# Patient Record
Sex: Female | Born: 1950 | Race: White | Hispanic: No | Marital: Married | Smoking: Never smoker
Health system: Southern US, Community
[De-identification: ages and names within clinical notes are randomized; demographics above are authoritative.]

## PROBLEM LIST (undated history)

## (undated) DIAGNOSIS — E282 Polycystic ovarian syndrome: Secondary | ICD-10-CM

## (undated) DIAGNOSIS — M549 Dorsalgia, unspecified: Secondary | ICD-10-CM

## (undated) DIAGNOSIS — C50912 Malignant neoplasm of unspecified site of left female breast: Principal | ICD-10-CM

## (undated) DIAGNOSIS — Z923 Personal history of irradiation: Secondary | ICD-10-CM

## (undated) DIAGNOSIS — T7840XA Allergy, unspecified, initial encounter: Secondary | ICD-10-CM

## (undated) DIAGNOSIS — H269 Unspecified cataract: Secondary | ICD-10-CM

## (undated) DIAGNOSIS — Z9221 Personal history of antineoplastic chemotherapy: Secondary | ICD-10-CM

## (undated) DIAGNOSIS — K5900 Constipation, unspecified: Secondary | ICD-10-CM

## (undated) DIAGNOSIS — H3530 Unspecified macular degeneration: Secondary | ICD-10-CM

## (undated) DIAGNOSIS — R001 Bradycardia, unspecified: Secondary | ICD-10-CM

## (undated) DIAGNOSIS — M1990 Unspecified osteoarthritis, unspecified site: Secondary | ICD-10-CM

## (undated) DIAGNOSIS — I10 Essential (primary) hypertension: Secondary | ICD-10-CM

## (undated) DIAGNOSIS — F419 Anxiety disorder, unspecified: Secondary | ICD-10-CM

## (undated) DIAGNOSIS — C50919 Malignant neoplasm of unspecified site of unspecified female breast: Secondary | ICD-10-CM

## (undated) DIAGNOSIS — K589 Irritable bowel syndrome without diarrhea: Secondary | ICD-10-CM

## (undated) DIAGNOSIS — R079 Chest pain, unspecified: Secondary | ICD-10-CM

## (undated) DIAGNOSIS — E669 Obesity, unspecified: Secondary | ICD-10-CM

## (undated) DIAGNOSIS — R002 Palpitations: Secondary | ICD-10-CM

## (undated) DIAGNOSIS — C801 Malignant (primary) neoplasm, unspecified: Secondary | ICD-10-CM

## (undated) DIAGNOSIS — R0602 Shortness of breath: Secondary | ICD-10-CM

## (undated) DIAGNOSIS — F319 Bipolar disorder, unspecified: Secondary | ICD-10-CM

## (undated) DIAGNOSIS — F329 Major depressive disorder, single episode, unspecified: Secondary | ICD-10-CM

## (undated) DIAGNOSIS — R7301 Impaired fasting glucose: Secondary | ICD-10-CM

## (undated) DIAGNOSIS — I509 Heart failure, unspecified: Secondary | ICD-10-CM

## (undated) DIAGNOSIS — G4730 Sleep apnea, unspecified: Secondary | ICD-10-CM

## (undated) DIAGNOSIS — Z950 Presence of cardiac pacemaker: Secondary | ICD-10-CM

## (undated) DIAGNOSIS — R413 Other amnesia: Secondary | ICD-10-CM

## (undated) DIAGNOSIS — F32A Depression, unspecified: Secondary | ICD-10-CM

## (undated) DIAGNOSIS — E785 Hyperlipidemia, unspecified: Secondary | ICD-10-CM

## (undated) DIAGNOSIS — E7800 Pure hypercholesterolemia, unspecified: Secondary | ICD-10-CM

## (undated) HISTORY — DX: Constipation, unspecified: K59.00

## (undated) HISTORY — DX: Essential (primary) hypertension: I10

## (undated) HISTORY — PX: TOTAL KNEE ARTHROPLASTY: SHX125

## (undated) HISTORY — DX: Obesity, unspecified: E66.9

## (undated) HISTORY — DX: Sleep apnea, unspecified: G47.30

## (undated) HISTORY — DX: Chest pain, unspecified: R07.9

## (undated) HISTORY — DX: Impaired fasting glucose: R73.01

## (undated) HISTORY — PX: APPENDECTOMY: SHX54

## (undated) HISTORY — DX: Depression, unspecified: F32.A

## (undated) HISTORY — DX: Shortness of breath: R06.02

## (undated) HISTORY — PX: CHOLECYSTECTOMY: SHX55

## (undated) HISTORY — DX: Unspecified osteoarthritis, unspecified site: M19.90

## (undated) HISTORY — PX: ABDOMINAL HYSTERECTOMY: SHX81

## (undated) HISTORY — PX: EYE SURGERY: SHX253

## (undated) HISTORY — DX: Malignant neoplasm of unspecified site of left female breast: C50.912

## (undated) HISTORY — DX: Unspecified macular degeneration: H35.30

## (undated) HISTORY — DX: Other amnesia: R41.3

## (undated) HISTORY — PX: JOINT REPLACEMENT: SHX530

## (undated) HISTORY — DX: Hyperlipidemia, unspecified: E78.5

## (undated) HISTORY — DX: Allergy, unspecified, initial encounter: T78.40XA

## (undated) HISTORY — DX: Dorsalgia, unspecified: M54.9

## (undated) HISTORY — DX: Irritable bowel syndrome, unspecified: K58.9

## (undated) HISTORY — PX: CATARACT EXTRACTION: SUR2

## (undated) HISTORY — DX: Palpitations: R00.2

## (undated) HISTORY — DX: Major depressive disorder, single episode, unspecified: F32.9

## (undated) HISTORY — DX: Unspecified cataract: H26.9

## (undated) HISTORY — DX: Polycystic ovarian syndrome: E28.2

---

## 1998-03-19 ENCOUNTER — Encounter (HOSPITAL_COMMUNITY): Admission: RE | Admit: 1998-03-19 | Discharge: 1998-06-17 | Payer: Self-pay

## 1998-03-19 ENCOUNTER — Emergency Department (HOSPITAL_COMMUNITY): Admission: EM | Admit: 1998-03-19 | Discharge: 1998-03-19 | Payer: Self-pay | Admitting: Emergency Medicine

## 1998-10-20 ENCOUNTER — Inpatient Hospital Stay (HOSPITAL_COMMUNITY): Admission: AD | Admit: 1998-10-20 | Discharge: 1998-10-27 | Payer: Self-pay | Admitting: General Practice

## 2000-10-04 ENCOUNTER — Inpatient Hospital Stay (HOSPITAL_COMMUNITY): Admission: EM | Admit: 2000-10-04 | Discharge: 2000-10-07 | Payer: Self-pay | Admitting: *Deleted

## 2000-10-10 ENCOUNTER — Other Ambulatory Visit (HOSPITAL_COMMUNITY): Admission: RE | Admit: 2000-10-10 | Discharge: 2000-10-14 | Payer: Self-pay | Admitting: Psychiatry

## 2000-10-18 ENCOUNTER — Emergency Department (HOSPITAL_COMMUNITY): Admission: EM | Admit: 2000-10-18 | Discharge: 2000-10-18 | Payer: Self-pay | Admitting: *Deleted

## 2000-10-18 ENCOUNTER — Encounter: Payer: Self-pay | Admitting: *Deleted

## 2000-11-14 ENCOUNTER — Other Ambulatory Visit: Admission: RE | Admit: 2000-11-14 | Discharge: 2000-11-14 | Payer: Self-pay | Admitting: *Deleted

## 2001-01-20 ENCOUNTER — Encounter: Payer: Self-pay | Admitting: Orthopaedic Surgery

## 2001-01-20 ENCOUNTER — Encounter: Admission: RE | Admit: 2001-01-20 | Discharge: 2001-01-20 | Payer: Self-pay | Admitting: Orthopaedic Surgery

## 2001-03-17 ENCOUNTER — Ambulatory Visit (HOSPITAL_BASED_OUTPATIENT_CLINIC_OR_DEPARTMENT_OTHER): Admission: RE | Admit: 2001-03-17 | Discharge: 2001-03-17 | Payer: Self-pay | Admitting: Family Medicine

## 2001-03-23 ENCOUNTER — Encounter (HOSPITAL_COMMUNITY): Admission: RE | Admit: 2001-03-23 | Discharge: 2001-04-22 | Payer: Self-pay | Admitting: Rheumatology

## 2001-05-18 ENCOUNTER — Encounter (HOSPITAL_COMMUNITY): Admission: RE | Admit: 2001-05-18 | Discharge: 2001-06-17 | Payer: Self-pay | Admitting: Rheumatology

## 2001-09-06 ENCOUNTER — Other Ambulatory Visit (HOSPITAL_COMMUNITY): Admission: RE | Admit: 2001-09-06 | Discharge: 2001-09-18 | Payer: Self-pay | Admitting: Psychiatry

## 2001-10-14 ENCOUNTER — Encounter: Payer: Self-pay | Admitting: Pediatrics

## 2001-10-14 ENCOUNTER — Ambulatory Visit (HOSPITAL_COMMUNITY): Admission: RE | Admit: 2001-10-14 | Discharge: 2001-10-14 | Payer: Self-pay | Admitting: Pediatrics

## 2001-11-28 ENCOUNTER — Ambulatory Visit (HOSPITAL_COMMUNITY): Admission: RE | Admit: 2001-11-28 | Discharge: 2001-11-28 | Payer: Self-pay | Admitting: Family Medicine

## 2002-03-20 ENCOUNTER — Ambulatory Visit (HOSPITAL_COMMUNITY): Admission: RE | Admit: 2002-03-20 | Discharge: 2002-03-20 | Payer: Self-pay | Admitting: Internal Medicine

## 2002-05-10 ENCOUNTER — Other Ambulatory Visit (HOSPITAL_COMMUNITY): Admission: RE | Admit: 2002-05-10 | Discharge: 2002-05-18 | Payer: Self-pay | Admitting: Psychiatry

## 2002-11-14 ENCOUNTER — Other Ambulatory Visit (HOSPITAL_COMMUNITY): Admission: RE | Admit: 2002-11-14 | Discharge: 2002-11-23 | Payer: Self-pay | Admitting: Psychiatry

## 2003-02-12 ENCOUNTER — Inpatient Hospital Stay (HOSPITAL_COMMUNITY): Admission: EM | Admit: 2003-02-12 | Discharge: 2003-02-18 | Payer: Self-pay | Admitting: Psychiatry

## 2003-02-19 ENCOUNTER — Other Ambulatory Visit (HOSPITAL_COMMUNITY): Admission: RE | Admit: 2003-02-19 | Discharge: 2003-03-01 | Payer: Self-pay | Admitting: Psychiatry

## 2003-09-17 ENCOUNTER — Emergency Department (HOSPITAL_COMMUNITY): Admission: EM | Admit: 2003-09-17 | Discharge: 2003-09-17 | Payer: Self-pay | Admitting: Emergency Medicine

## 2003-09-30 ENCOUNTER — Ambulatory Visit (HOSPITAL_COMMUNITY): Admission: RE | Admit: 2003-09-30 | Discharge: 2003-09-30 | Payer: Self-pay | Admitting: Family Medicine

## 2003-10-31 ENCOUNTER — Ambulatory Visit (HOSPITAL_COMMUNITY): Admission: RE | Admit: 2003-10-31 | Discharge: 2003-10-31 | Payer: Self-pay | Admitting: *Deleted

## 2003-12-13 ENCOUNTER — Emergency Department (HOSPITAL_COMMUNITY): Admission: EM | Admit: 2003-12-13 | Discharge: 2003-12-13 | Payer: Self-pay | Admitting: Emergency Medicine

## 2006-07-12 HISTORY — PX: BREAST LUMPECTOMY: SHX2

## 2006-07-12 HISTORY — PX: BREAST BIOPSY: SHX20

## 2006-08-30 ENCOUNTER — Inpatient Hospital Stay (HOSPITAL_COMMUNITY): Admission: RE | Admit: 2006-08-30 | Discharge: 2006-09-02 | Payer: Self-pay | Admitting: Specialist

## 2007-04-26 ENCOUNTER — Encounter: Admission: RE | Admit: 2007-04-26 | Discharge: 2007-04-26 | Payer: Self-pay | Admitting: Family Medicine

## 2007-05-01 ENCOUNTER — Encounter (INDEPENDENT_AMBULATORY_CARE_PROVIDER_SITE_OTHER): Payer: Self-pay | Admitting: Diagnostic Radiology

## 2007-05-01 ENCOUNTER — Encounter: Admission: RE | Admit: 2007-05-01 | Discharge: 2007-05-01 | Payer: Self-pay | Admitting: Family Medicine

## 2007-05-09 ENCOUNTER — Encounter: Admission: RE | Admit: 2007-05-09 | Discharge: 2007-05-09 | Payer: Self-pay | Admitting: Family Medicine

## 2007-05-22 ENCOUNTER — Encounter: Admission: RE | Admit: 2007-05-22 | Discharge: 2007-05-22 | Payer: Self-pay | Admitting: Surgery

## 2007-05-23 ENCOUNTER — Ambulatory Visit (HOSPITAL_BASED_OUTPATIENT_CLINIC_OR_DEPARTMENT_OTHER): Admission: RE | Admit: 2007-05-23 | Discharge: 2007-05-23 | Payer: Self-pay | Admitting: Surgery

## 2007-05-23 ENCOUNTER — Encounter (INDEPENDENT_AMBULATORY_CARE_PROVIDER_SITE_OTHER): Payer: Self-pay | Admitting: Surgery

## 2007-05-26 ENCOUNTER — Ambulatory Visit: Payer: Self-pay | Admitting: Oncology

## 2007-06-07 LAB — COMPREHENSIVE METABOLIC PANEL
ALT: 17 U/L (ref 0–35)
AST: 18 U/L (ref 0–37)
Alkaline Phosphatase: 110 U/L (ref 39–117)
BUN: 21 mg/dL (ref 6–23)
Chloride: 103 mEq/L (ref 96–112)
Creatinine, Ser: 0.71 mg/dL (ref 0.40–1.20)
Total Bilirubin: 0.5 mg/dL (ref 0.3–1.2)

## 2007-06-07 LAB — CBC WITH DIFFERENTIAL/PLATELET
BASO%: 0.3 % (ref 0.0–2.0)
Basophils Absolute: 0 10*3/uL (ref 0.0–0.1)
EOS%: 0 % (ref 0.0–7.0)
HCT: 40 % (ref 34.8–46.6)
HGB: 13.9 g/dL (ref 11.6–15.9)
MCH: 31.1 pg (ref 26.0–34.0)
MCHC: 34.8 g/dL (ref 32.0–36.0)
MCV: 89.5 fL (ref 81.0–101.0)
MONO%: 9.3 % (ref 0.0–13.0)
NEUT%: 59.8 % (ref 39.6–76.8)
RDW: 13.1 % (ref 11.3–14.5)
lymph#: 1.7 10*3/uL (ref 0.9–3.3)

## 2007-06-07 LAB — CANCER ANTIGEN 27.29: CA 27.29: 16 U/mL (ref 0–39)

## 2007-06-14 LAB — VITAMIN D PNL(25-HYDRXY+1,25-DIHY)-BLD
Vit D, 1,25-Dihydroxy: 28 pg/mL (ref 6–62)
Vit D, 25-Hydroxy: 27 ng/mL — ABNORMAL LOW (ref 30–89)

## 2007-06-16 ENCOUNTER — Ambulatory Visit (HOSPITAL_COMMUNITY): Admission: RE | Admit: 2007-06-16 | Discharge: 2007-06-16 | Payer: Self-pay | Admitting: Oncology

## 2007-06-20 ENCOUNTER — Ambulatory Visit: Admission: RE | Admit: 2007-06-20 | Discharge: 2007-07-04 | Payer: Self-pay | Admitting: Radiation Oncology

## 2007-07-10 ENCOUNTER — Encounter: Admission: RE | Admit: 2007-07-10 | Discharge: 2007-07-10 | Payer: Self-pay | Admitting: Oncology

## 2007-07-11 ENCOUNTER — Ambulatory Visit: Payer: Self-pay | Admitting: Oncology

## 2007-07-13 DIAGNOSIS — Z923 Personal history of irradiation: Secondary | ICD-10-CM

## 2007-07-13 DIAGNOSIS — Z9221 Personal history of antineoplastic chemotherapy: Secondary | ICD-10-CM

## 2007-07-13 HISTORY — DX: Personal history of antineoplastic chemotherapy: Z92.21

## 2007-07-13 HISTORY — DX: Personal history of irradiation: Z92.3

## 2007-07-18 ENCOUNTER — Emergency Department (HOSPITAL_COMMUNITY): Admission: EM | Admit: 2007-07-18 | Discharge: 2007-07-18 | Payer: Self-pay | Admitting: Emergency Medicine

## 2007-07-21 LAB — COMPREHENSIVE METABOLIC PANEL
ALT: 37 U/L — ABNORMAL HIGH (ref 0–35)
Albumin: 3.6 g/dL (ref 3.5–5.2)
CO2: 29 mEq/L (ref 19–32)
Glucose, Bld: 98 mg/dL (ref 70–99)
Potassium: 3 mEq/L — ABNORMAL LOW (ref 3.5–5.3)
Sodium: 136 mEq/L (ref 135–145)
Total Bilirubin: 0.9 mg/dL (ref 0.3–1.2)
Total Protein: 6.9 g/dL (ref 6.0–8.3)

## 2007-07-21 LAB — CBC WITH DIFFERENTIAL/PLATELET
BASO%: 0.1 % (ref 0.0–2.0)
Eosinophils Absolute: 0 10*3/uL (ref 0.0–0.5)
LYMPH%: 22.7 % (ref 14.0–48.0)
MCHC: 34.4 g/dL (ref 32.0–36.0)
MONO#: 0.3 10*3/uL (ref 0.1–0.9)
NEUT#: 5.6 10*3/uL (ref 1.5–6.5)
RBC: 4.26 10*6/uL (ref 3.70–5.32)
RDW: 12.6 % (ref 11.3–14.5)
WBC: 7.7 10*3/uL (ref 3.9–10.0)
lymph#: 1.8 10*3/uL (ref 0.9–3.3)

## 2007-08-04 LAB — CBC WITH DIFFERENTIAL/PLATELET
BASO%: 0.3 % (ref 0.0–2.0)
Basophils Absolute: 0 10*3/uL (ref 0.0–0.1)
Eosinophils Absolute: 0 10*3/uL (ref 0.0–0.5)
HCT: 35.2 % (ref 34.8–46.6)
HGB: 12.4 g/dL (ref 11.6–15.9)
LYMPH%: 7.4 % — ABNORMAL LOW (ref 14.0–48.0)
MCHC: 35.1 g/dL (ref 32.0–36.0)
MCV: 88 fL (ref 81.0–101.0)
MONO%: 1.7 % (ref 0.0–13.0)
NEUT#: 7.4 10*3/uL — ABNORMAL HIGH (ref 1.5–6.5)
NEUT%: 90.7 % — ABNORMAL HIGH (ref 39.6–76.8)
Platelets: 511 10*3/uL — ABNORMAL HIGH (ref 145–400)
RBC: 4 10*6/uL (ref 3.70–5.32)
RDW: 11.6 % (ref 11.3–14.5)
WBC: 8.1 10*3/uL (ref 3.9–10.0)
lymph#: 0.6 10*3/uL — ABNORMAL LOW (ref 0.9–3.3)

## 2007-08-04 LAB — COMPREHENSIVE METABOLIC PANEL
ALT: 18 U/L (ref 0–35)
Albumin: 4.7 g/dL (ref 3.5–5.2)
CO2: 22 mEq/L (ref 19–32)
Calcium: 9.6 mg/dL (ref 8.4–10.5)
Chloride: 102 mEq/L (ref 96–112)
Glucose, Bld: 101 mg/dL — ABNORMAL HIGH (ref 70–99)
Potassium: 4.4 mEq/L (ref 3.5–5.3)
Sodium: 137 mEq/L (ref 135–145)
Total Bilirubin: 0.4 mg/dL (ref 0.3–1.2)
Total Protein: 7.5 g/dL (ref 6.0–8.3)

## 2007-08-11 LAB — CBC WITH DIFFERENTIAL/PLATELET
BASO%: 0.2 % (ref 0.0–2.0)
Eosinophils Absolute: 0 10*3/uL (ref 0.0–0.5)
MCHC: 34.3 g/dL (ref 32.0–36.0)
MONO#: 1 10*3/uL — ABNORMAL HIGH (ref 0.1–0.9)
NEUT#: 14 10*3/uL — ABNORMAL HIGH (ref 1.5–6.5)
RBC: 3.99 10*6/uL (ref 3.70–5.32)
RDW: 13.9 % (ref 11.3–14.5)
WBC: 17 10*3/uL — ABNORMAL HIGH (ref 3.9–10.0)
lymph#: 2 10*3/uL (ref 0.9–3.3)

## 2007-08-23 ENCOUNTER — Ambulatory Visit: Payer: Self-pay | Admitting: Oncology

## 2007-08-25 LAB — COMPREHENSIVE METABOLIC PANEL
ALT: 16 U/L (ref 0–35)
AST: 18 U/L (ref 0–37)
Alkaline Phosphatase: 117 U/L (ref 39–117)
BUN: 15 mg/dL (ref 6–23)
Creatinine, Ser: 0.57 mg/dL (ref 0.40–1.20)
Total Bilirubin: 0.3 mg/dL (ref 0.3–1.2)

## 2007-08-25 LAB — CBC WITH DIFFERENTIAL/PLATELET
BASO%: 0.3 % (ref 0.0–2.0)
EOS%: 0.2 % (ref 0.0–7.0)
HCT: 33.2 % — ABNORMAL LOW (ref 34.8–46.6)
LYMPH%: 6 % — ABNORMAL LOW (ref 14.0–48.0)
MCH: 30.7 pg (ref 26.0–34.0)
MCHC: 34.4 g/dL (ref 32.0–36.0)
MCV: 89.1 fL (ref 81.0–101.0)
MONO%: 1 % (ref 0.0–13.0)
NEUT%: 92.4 % — ABNORMAL HIGH (ref 39.6–76.8)
Platelets: 454 10*3/uL — ABNORMAL HIGH (ref 145–400)
lymph#: 0.5 10*3/uL — ABNORMAL LOW (ref 0.9–3.3)

## 2007-09-01 LAB — CBC WITH DIFFERENTIAL/PLATELET
BASO%: 0 % (ref 0.0–2.0)
Basophils Absolute: 0 10*3/uL (ref 0.0–0.1)
EOS%: 0 % (ref 0.0–7.0)
HCT: 33.9 % — ABNORMAL LOW (ref 34.8–46.6)
HGB: 11.8 g/dL (ref 11.6–15.9)
LYMPH%: 22.5 % (ref 14.0–48.0)
MCH: 31.6 pg (ref 26.0–34.0)
MCHC: 34.8 g/dL (ref 32.0–36.0)
NEUT%: 66.1 % (ref 39.6–76.8)
Platelets: 310 10*3/uL (ref 145–400)

## 2007-09-15 LAB — COMPREHENSIVE METABOLIC PANEL
ALT: 13 U/L (ref 0–35)
AST: 15 U/L (ref 0–37)
Albumin: 4.6 g/dL (ref 3.5–5.2)
Calcium: 9.5 mg/dL (ref 8.4–10.5)
Chloride: 105 mEq/L (ref 96–112)
Potassium: 4.2 mEq/L (ref 3.5–5.3)

## 2007-09-15 LAB — CBC WITH DIFFERENTIAL/PLATELET
BASO%: 0.1 % (ref 0.0–2.0)
EOS%: 0 % (ref 0.0–7.0)
HGB: 11.3 g/dL — ABNORMAL LOW (ref 11.6–15.9)
MCH: 31.1 pg (ref 26.0–34.0)
MCHC: 33.4 g/dL (ref 32.0–36.0)
RDW: 15.3 % — ABNORMAL HIGH (ref 11.3–14.5)
lymph#: 0.7 10*3/uL — ABNORMAL LOW (ref 0.9–3.3)

## 2007-09-22 ENCOUNTER — Ambulatory Visit: Admission: RE | Admit: 2007-09-22 | Discharge: 2007-12-05 | Payer: Self-pay | Admitting: Radiation Oncology

## 2007-09-22 LAB — COMPREHENSIVE METABOLIC PANEL
ALT: 14 U/L (ref 0–35)
AST: 16 U/L (ref 0–37)
Alkaline Phosphatase: 107 U/L (ref 39–117)
Calcium: 9.2 mg/dL (ref 8.4–10.5)
Chloride: 104 mEq/L (ref 96–112)
Creatinine, Ser: 0.6 mg/dL (ref 0.40–1.20)

## 2007-09-22 LAB — CBC WITH DIFFERENTIAL/PLATELET
BASO%: 0.2 % (ref 0.0–2.0)
EOS%: 0 % (ref 0.0–7.0)
MCH: 31.4 pg (ref 26.0–34.0)
MCHC: 34.4 g/dL (ref 32.0–36.0)
NEUT%: 50.8 % (ref 39.6–76.8)
RDW: 16.1 % — ABNORMAL HIGH (ref 11.3–14.5)
lymph#: 1.2 10*3/uL (ref 0.9–3.3)

## 2007-11-23 ENCOUNTER — Ambulatory Visit: Payer: Self-pay | Admitting: Psychiatry

## 2007-11-23 ENCOUNTER — Ambulatory Visit: Payer: Self-pay | Admitting: Oncology

## 2007-11-29 ENCOUNTER — Ambulatory Visit: Payer: Self-pay | Admitting: Psychiatry

## 2007-12-06 ENCOUNTER — Ambulatory Visit: Payer: Self-pay | Admitting: Psychiatry

## 2007-12-26 ENCOUNTER — Ambulatory Visit: Payer: Self-pay | Admitting: Psychiatry

## 2007-12-27 LAB — CBC WITH DIFFERENTIAL/PLATELET
Basophils Absolute: 0 10*3/uL (ref 0.0–0.1)
Eosinophils Absolute: 0 10*3/uL (ref 0.0–0.5)
HCT: 39 % (ref 34.8–46.6)
HGB: 13.4 g/dL (ref 11.6–15.9)
MCV: 87.5 fL (ref 81.0–101.0)
MONO%: 12.3 % (ref 0.0–13.0)
NEUT#: 2.5 10*3/uL (ref 1.5–6.5)
NEUT%: 56.3 % (ref 39.6–76.8)
Platelets: 274 10*3/uL (ref 145–400)
RDW: 13.8 % (ref 11.3–14.5)

## 2007-12-28 LAB — COMPREHENSIVE METABOLIC PANEL
Albumin: 4.7 g/dL (ref 3.5–5.2)
Alkaline Phosphatase: 115 U/L (ref 39–117)
BUN: 24 mg/dL — ABNORMAL HIGH (ref 6–23)
Calcium: 10.2 mg/dL (ref 8.4–10.5)
Glucose, Bld: 95 mg/dL (ref 70–99)
Potassium: 4.8 mEq/L (ref 3.5–5.3)

## 2007-12-28 LAB — CANCER ANTIGEN 27.29: CA 27.29: 19 U/mL (ref 0–39)

## 2007-12-28 LAB — VITAMIN D 25 HYDROXY (VIT D DEFICIENCY, FRACTURES): Vit D, 25-Hydroxy: 30 ng/mL (ref 30–89)

## 2008-01-23 ENCOUNTER — Ambulatory Visit: Payer: Self-pay | Admitting: Psychiatry

## 2008-02-20 ENCOUNTER — Ambulatory Visit: Payer: Self-pay | Admitting: Psychiatry

## 2008-03-05 ENCOUNTER — Ambulatory Visit: Payer: Self-pay | Admitting: Oncology

## 2008-03-05 LAB — COMPREHENSIVE METABOLIC PANEL
ALT: 28 U/L (ref 0–35)
AST: 26 U/L (ref 0–37)
Alkaline Phosphatase: 140 U/L — ABNORMAL HIGH (ref 39–117)
Creatinine, Ser: 0.73 mg/dL (ref 0.40–1.20)
Sodium: 141 mEq/L (ref 135–145)
Total Bilirubin: 0.6 mg/dL (ref 0.3–1.2)

## 2008-03-05 LAB — CBC WITH DIFFERENTIAL/PLATELET
BASO%: 0.2 % (ref 0.0–2.0)
EOS%: 0 % (ref 0.0–7.0)
HCT: 38.3 % (ref 34.8–46.6)
LYMPH%: 26.8 % (ref 14.0–48.0)
MCH: 30.4 pg (ref 26.0–34.0)
MCHC: 34.1 g/dL (ref 32.0–36.0)
NEUT%: 62.3 % (ref 39.6–76.8)
Platelets: 259 10*3/uL (ref 145–400)
RBC: 4.29 10*6/uL (ref 3.70–5.32)

## 2008-03-05 LAB — LACTATE DEHYDROGENASE: LDH: 187 U/L (ref 94–250)

## 2008-03-12 ENCOUNTER — Other Ambulatory Visit (HOSPITAL_COMMUNITY): Admission: RE | Admit: 2008-03-12 | Discharge: 2008-03-29 | Payer: Self-pay | Admitting: Psychiatry

## 2008-03-14 ENCOUNTER — Ambulatory Visit: Payer: Self-pay | Admitting: Psychiatry

## 2008-05-01 ENCOUNTER — Encounter: Admission: RE | Admit: 2008-05-01 | Discharge: 2008-05-01 | Payer: Self-pay | Admitting: Oncology

## 2008-05-06 ENCOUNTER — Ambulatory Visit: Payer: Self-pay | Admitting: Oncology

## 2008-07-30 ENCOUNTER — Ambulatory Visit: Payer: Self-pay | Admitting: Oncology

## 2008-11-27 ENCOUNTER — Ambulatory Visit (HOSPITAL_COMMUNITY): Admission: RE | Admit: 2008-11-27 | Discharge: 2008-11-27 | Payer: Self-pay | Admitting: Specialist

## 2009-01-27 ENCOUNTER — Ambulatory Visit: Payer: Self-pay | Admitting: Oncology

## 2009-02-03 LAB — COMPREHENSIVE METABOLIC PANEL
ALT: 29 U/L (ref 0–35)
AST: 30 U/L (ref 0–37)
Albumin: 4.4 g/dL (ref 3.5–5.2)
BUN: 16 mg/dL (ref 6–23)
CO2: 28 mEq/L (ref 19–32)
Calcium: 9.8 mg/dL (ref 8.4–10.5)
Chloride: 102 mEq/L (ref 96–112)
Creatinine, Ser: 0.62 mg/dL (ref 0.40–1.20)
Potassium: 4.2 mEq/L (ref 3.5–5.3)

## 2009-02-03 LAB — CBC WITH DIFFERENTIAL/PLATELET
Basophils Absolute: 0 10*3/uL (ref 0.0–0.1)
EOS%: 0.1 % (ref 0.0–7.0)
HCT: 41.3 % (ref 34.8–46.6)
HGB: 14 g/dL (ref 11.6–15.9)
MCH: 31.3 pg (ref 25.1–34.0)
MONO#: 0.6 10*3/uL (ref 0.1–0.9)
NEUT#: 3 10*3/uL (ref 1.5–6.5)
NEUT%: 54.4 % (ref 38.4–76.8)
RDW: 13.7 % (ref 11.2–14.5)
WBC: 5.6 10*3/uL (ref 3.9–10.3)
lymph#: 1.9 10*3/uL (ref 0.9–3.3)

## 2009-02-03 LAB — LACTATE DEHYDROGENASE: LDH: 168 U/L (ref 94–250)

## 2009-02-04 LAB — VITAMIN D 25 HYDROXY (VIT D DEFICIENCY, FRACTURES): Vit D, 25-Hydroxy: 28 ng/mL — ABNORMAL LOW (ref 30–89)

## 2009-05-02 ENCOUNTER — Encounter: Admission: RE | Admit: 2009-05-02 | Discharge: 2009-05-02 | Payer: Self-pay | Admitting: Oncology

## 2009-07-12 HISTORY — PX: BREAST BIOPSY: SHX20

## 2009-07-24 ENCOUNTER — Ambulatory Visit: Payer: Self-pay | Admitting: Oncology

## 2009-07-28 LAB — COMPREHENSIVE METABOLIC PANEL
ALT: 30 U/L (ref 0–35)
AST: 32 U/L (ref 0–37)
Albumin: 4.2 g/dL (ref 3.5–5.2)
BUN: 18 mg/dL (ref 6–23)
Calcium: 9.5 mg/dL (ref 8.4–10.5)
Chloride: 100 mEq/L (ref 96–112)
Glucose, Bld: 92 mg/dL (ref 70–99)
Potassium: 4 mEq/L (ref 3.5–5.3)
Sodium: 136 mEq/L (ref 135–145)
Total Bilirubin: 0.7 mg/dL (ref 0.3–1.2)
Total Protein: 7.7 g/dL (ref 6.0–8.3)

## 2009-07-28 LAB — CBC WITH DIFFERENTIAL/PLATELET
HCT: 40.7 % (ref 34.8–46.6)
LYMPH%: 33.3 % (ref 14.0–49.7)
MONO#: 0.5 10*3/uL (ref 0.1–0.9)
MONO%: 9.2 % (ref 0.0–14.0)
NEUT#: 3 10*3/uL (ref 1.5–6.5)
NEUT%: 56.7 % (ref 38.4–76.8)
RBC: 4.36 10*6/uL (ref 3.70–5.45)
WBC: 5.4 10*3/uL (ref 3.9–10.3)

## 2009-07-28 LAB — VITAMIN D 25 HYDROXY (VIT D DEFICIENCY, FRACTURES): Vit D, 25-Hydroxy: 34 ng/mL (ref 30–89)

## 2009-10-08 ENCOUNTER — Encounter: Admission: RE | Admit: 2009-10-08 | Discharge: 2009-10-08 | Payer: Self-pay | Admitting: Oncology

## 2009-10-15 ENCOUNTER — Encounter: Admission: RE | Admit: 2009-10-15 | Discharge: 2009-10-15 | Payer: Self-pay | Admitting: Oncology

## 2009-12-01 ENCOUNTER — Ambulatory Visit: Payer: Self-pay | Admitting: Oncology

## 2009-12-02 LAB — CBC WITH DIFFERENTIAL/PLATELET
Basophils Absolute: 0 10*3/uL (ref 0.0–0.1)
EOS%: 1.1 % (ref 0.0–7.0)
MCH: 30.7 pg (ref 25.1–34.0)
MCHC: 32.8 g/dL (ref 31.5–36.0)
NEUT#: 3 10*3/uL (ref 1.5–6.5)
NEUT%: 53.3 % (ref 38.4–76.8)
Platelets: 293 10*3/uL (ref 145–400)
RDW: 13.6 % (ref 11.2–14.5)

## 2009-12-02 LAB — COMPREHENSIVE METABOLIC PANEL
AST: 18 U/L (ref 0–37)
Albumin: 4.3 g/dL (ref 3.5–5.2)
Alkaline Phosphatase: 104 U/L (ref 39–117)
BUN: 19 mg/dL (ref 6–23)
Calcium: 9.7 mg/dL (ref 8.4–10.5)
Sodium: 140 mEq/L (ref 135–145)
Total Bilirubin: 0.4 mg/dL (ref 0.3–1.2)
Total Protein: 7.2 g/dL (ref 6.0–8.3)

## 2010-05-07 ENCOUNTER — Encounter: Admission: RE | Admit: 2010-05-07 | Discharge: 2010-05-07 | Payer: Self-pay | Admitting: Oncology

## 2010-05-11 LAB — HM PAP SMEAR: HM Pap smear: NORMAL

## 2010-06-05 ENCOUNTER — Ambulatory Visit: Payer: Self-pay | Admitting: Oncology

## 2010-07-17 ENCOUNTER — Ambulatory Visit: Payer: Self-pay | Admitting: Oncology

## 2010-07-21 LAB — COMPREHENSIVE METABOLIC PANEL
ALT: 23 U/L (ref 0–35)
AST: 24 U/L (ref 0–37)
Albumin: 4 g/dL (ref 3.5–5.2)
Alkaline Phosphatase: 100 U/L (ref 39–117)
BUN: 18 mg/dL (ref 6–23)
CO2: 31 mEq/L (ref 19–32)
Calcium: 9.5 mg/dL (ref 8.4–10.5)
Chloride: 102 mEq/L (ref 96–112)
Creatinine, Ser: 0.68 mg/dL (ref 0.40–1.20)
Glucose, Bld: 109 mg/dL — ABNORMAL HIGH (ref 70–99)
Potassium: 4.3 mEq/L (ref 3.5–5.3)
Sodium: 142 mEq/L (ref 135–145)
Total Bilirubin: 0.5 mg/dL (ref 0.3–1.2)
Total Protein: 7.3 g/dL (ref 6.0–8.3)

## 2010-07-21 LAB — CBC WITH DIFFERENTIAL/PLATELET
BASO%: 0.3 % (ref 0.0–2.0)
Basophils Absolute: 0 10*3/uL (ref 0.0–0.1)
EOS%: 0.4 % (ref 0.0–7.0)
Eosinophils Absolute: 0 10*3/uL (ref 0.0–0.5)
HCT: 39.5 % (ref 34.8–46.6)
HGB: 13.4 g/dL (ref 11.6–15.9)
LYMPH%: 33.3 % (ref 14.0–49.7)
MCH: 30.9 pg (ref 25.1–34.0)
MCHC: 33.8 g/dL (ref 31.5–36.0)
MCV: 91.5 fL (ref 79.5–101.0)
MONO#: 0.5 10*3/uL (ref 0.1–0.9)
MONO%: 9.7 % (ref 0.0–14.0)
NEUT#: 3.1 10*3/uL (ref 1.5–6.5)
NEUT%: 56.3 % (ref 38.4–76.8)
Platelets: 278 10*3/uL (ref 145–400)
RBC: 4.32 10*6/uL (ref 3.70–5.45)
RDW: 13.8 % (ref 11.2–14.5)
WBC: 5.5 10*3/uL (ref 3.9–10.3)
lymph#: 1.8 10*3/uL (ref 0.9–3.3)

## 2010-07-21 LAB — LACTATE DEHYDROGENASE: LDH: 179 U/L (ref 94–250)

## 2010-07-21 LAB — CANCER ANTIGEN 27.29: CA 27.29: 21 U/mL (ref 0–39)

## 2010-07-21 LAB — VITAMIN D 25 HYDROXY (VIT D DEFICIENCY, FRACTURES): Vit D, 25-Hydroxy: 41 ng/mL (ref 30–89)

## 2010-08-01 ENCOUNTER — Encounter: Payer: Self-pay | Admitting: Oncology

## 2010-08-02 ENCOUNTER — Encounter: Payer: Self-pay | Admitting: Family Medicine

## 2010-08-02 ENCOUNTER — Encounter: Payer: Self-pay | Admitting: Oncology

## 2010-11-24 NOTE — Op Note (Signed)
[name], [name]             ACCOUNT NO.:  [id number]   MEDICAL RECORD NO.:  [id number]          PATIENT TYPE:  AMB   LOCATION:  DSC                          FACILITY:  [facility]   PHYSICIAN:  Currie Paris, M.D.DATE OF BIRTH:  Dec 07, 1950   DATE OF PROCEDURE:  05/23/2007  DATE OF DISCHARGE:                               OPERATIVE REPORT   OFFICE MEDICAL RECORD NUMBER CCS [phone]   PREOPERATIVE DIAGNOSIS:  Carcinoma left breast upper outer quadrant.   POSTOPERATIVE DIAGNOSIS:  Carcinoma left breast upper outer quadrant.   OPERATION:  Left partial mastectomy with blue dye injection and axillary  sentinel lymph node biopsy.   SURGEON:  Currie Paris, M.D.   ANESTHESIA:  General.   CLINICAL HISTORY:  This is a 56-year lady recently presenting with a  left breast mass that a biopsy showed to be carcinoma.  It was a  palpable mass at the time I had seen her in the office.  After  discussion of alternatives, she elected to proceed to lumpectomy with  sentinel node evaluation.   DESCRIPTION OF PROCEDURE:  The patient was seen in the holding area and  she had no further questions.  We both marked the left breast as the  operative side.  She had already been injected with her radioisotope.   The patient was taken to the operating room.  After satisfactory general  anesthesia had been obtained, I re-examined breast.  I was no longer  certain I could palpate the mass as it was considerably smaller than  what I had noticed immediately post biopsy and I think I had been  feeling some of the biopsy changes.  However, the area in question was 2  cm from the nipple at  the 2:30 position so I felt confident we had it  well localized.  The breast was then prepped and draped and the time-out  performed.   I began by using Neoprobe to identify a hot area in the axilla and made  a transverse incision and divided through about an inch of fatty tissue  to get down to the axillary fat pad.   Almost immediately I found a blue  lymphatic leading to a blue lymph node that had counts of about 2400.  With a lot of other dissection, I was able to identify two other nodes  that had counts of about 200-300 and there were several other areas that  seemed to have counts about that level, but I could not really identify  any palpably abnormal nodes nor did I see any other blue nodes.  Having  sampled three, I thought this would be enough for the sentinel node and  placed a pack.   Attention was turned back to the breast.  I elected to try to make a  cosmetic incision at the areolar margin so I made a circumareolar  incision.  I raised a very superficial skin flap to the nipple.  The  nipple had been chronically inverted so I disconnected it from the  underlying tissue.  I then raised a skin flap going more towards the  axilla to the upper outer quadrant until I got to the level of the core  biopsy entry site.  I then went a little bit in all other directions to  have a fairly thin superficial border.  I then took a wide excision of  the area directly underneath what I had exposed taking tissue from  medially as far as the nipple and laterally from almost as far as the  biopsy site and down about 3 cm of tissue.  The patient has large  breasts so I did not get all the way to the chest wall, but got this  large area out.  In palpating, I could feel two areas in the specimen  that were suspicious for the tumor.  One looked more like fat necrosis  and both were fairly close to margins, one somewhat close to the more  inferior margin and I marked that with a suture that was short and then  another where it was closer to what was the posterior margin.  I marked  that with a longer suture.   I sent that to pathology and Dr. Luisa Hart called back that it appeared  that we had the biopsy site well in the specimen.  However, because I  thought margins were likely to be close, I went back and  re-excised  another margin at least a centimeter thick in all directions other than  anterior and this was a nice flat piece of tissue that we really  completely re-excised the margins.  This was labeled and sent to  pathology.   I spent several minutes irrigating, making sure everything was dry.  The  breast was then closed with 3-0 Vicryl followed by for Monocryl  subcuticular and Dermabond.   Attention was turned back to the axilla.  It appeared dry.  Dr. Luisa Hart  reported that three nodes were negative so I closed this with 3-0  Vicryl, 4-0 Monocryl subcuticular and Dermabond.   The patient tolerated the procedure well.  There no complications.  All  counts were correct.      Currie Paris, M.D.  Electronically Signed     CJS/MEDQ  D:  05/23/2007  T:  05/24/2007  Job:  [id number]   cc:   Andrea Picket A. Gerda Diss, MD

## 2010-11-27 NOTE — Discharge Summary (Signed)
[name], [name]             ACCOUNT NO.:  [id number]   MEDICAL RECORD NO.:  [id number]          PATIENT TYPE:  INP   LOCATION:  1504                         FACILITY:  [facility]   PHYSICIAN:  Erasmo Leventhal, M.D.DATE OF BIRTH:  14-Jan-1951   DATE OF ADMISSION:  08/30/2006  DATE OF DISCHARGE:  09/02/2006                               DISCHARGE SUMMARY   ADMITTING DIAGNOSIS:  End-stage osteoarthritis left knee.   DISCHARGE DIAGNOSIS:  End-stage osteoarthritis left knee.   OPERATION:  Total knee arthroplasty left knee.   BRIEF HISTORY:  This is a 60 year old lady with a history of end-stage  osteoarthritis of her left knee with failure of conservative treatment.  She is now scheduled for total knee arthroplasty, risks and benefits  were discussed in detail with the patient questions invited and  answered.   LABORATORY VALUES:  Admission CBC within normal limits.  Admission CMET  within normal limits with the exception of a high sodium at 146.  Admission PT/PTT within normal limits, and an admission urinalysis  within normal limits.  Her hemoglobin and hematocrit reached a low of  9.8 and 27.9 on the 22nd.  Her BMET remained within normal limits with  the exception of a mildly elevated glucose with a high of 141 through  admission.   COURSE IN THE HOSPITAL:  The patient tolerated the operative procedure  extremely well.  First postoperative day vital signs are stable; she was  afebrile; neurovascular status intact.  Dressing dry, drain removed  without difficulty.  Lungs were clear.  Heart sounds normal.  Bowel  sounds active; and she was started on CPM and physical therapy.  Second  postoperative day vital signs stable, afebrile, hemoglobin 10.2,  hematocrit 29.7.  Lungs were clear.  Heart sounds normal.  Bowel sounds  sluggish.  Dressing was changed.  Wound was benign.  Calves were  negative; and she continued in therapy.  On the third postoperative day  she was feeling  good; she was moving well.  Vital signs stable,  afebrile, hemoglobin 9.8, hematocrit 27.9.  Lungs clear.  Heart sounds  normal.  Bowel sounds active.  Calves were negative.  Dressing was  changed and wound benign; and the patient desired to go home; and was  stable to do so and was subsequently discharged home.   CONDITION ON DISCHARGE:  Improved.   DISCHARGE MEDICATIONS:  1. Percocet 5/325 one to two q.4-6 h. p.r.n. pain.  2. Robaxin 500 one p.o. q.8 h. p.r.n. spasm.  3. Trinsicon one b.i.d. for anemia.  4. Lovenox 30 mg subcu q.12 h. for 7 days.   DISCHARGE INSTRUCTIONS:  Do home physical therapy, home CPM, and follow  up in the office in 2 weeks.      [name], P.A.    ______________________________  Erasmo Leventhal, M.D.    SJC/MEDQ  D:  09/02/2006  T:  09/02/2006  Job:  [id number]

## 2010-11-27 NOTE — Op Note (Signed)
[name], IAFRATE                       ACCOUNT NO.:  [id number]   MEDICAL RECORD NO.:  [id number]                   PATIENT TYPE:  AMB   LOCATION:  DAY                                  FACILITY:  [facility]   PHYSICIAN:  Lionel December, M.D.                 DATE OF BIRTH:  May 01, 1951   DATE OF PROCEDURE:  03/20/2002  DATE OF DISCHARGE:                                 OPERATIVE REPORT   PROCEDURE:  Esophagogastroduodenoscopy followed by total colonoscopy.   ENDOSCOPIST:  Lionel December, M.D.   INDICATIONS:  This patient is a 60 year old Caucasian female with recurrent  epigastric pain.  She was on Naprosyn which was discontinued, but the pain  persisted. She has been on Nexium and is still having this pain.  H. pylori  was checked recently and is negative.  She is, therefore, undergoing  diagnostic esophagogastroduodenoscopy.  This will be followed by a screening  colonoscopy.  Her risks for colorectal carcinoma is felt to be above average  as one of her sisters has had APR for rectal carcinoma.  Both the procedure  and risks were reviewed with the patient and informed consent was obtained.   PREOPERATIVE MEDICATIONS:  Cetacaine spray for pharyngeal topical  anesthesia, Demerol 65 mg IV and Versed 9 mg IV in divided dose.   INSTRUMENT:  Olympus video system.   FINDINGS:  Procedure performed in endoscopy suite.  The patient's vital  signs and O2 saturation were monitored during the procedure and remained  stable.   PROCEDURE #1 ESOPHAGOGASTRODUODENOSCOPY:  The patient was placed in the left  lateral recumbent position and endoscope was passed via the oropharynx  without any difficulty into the esophagus.   ESOPHAGUS:  Mucosa of the esophagus was normal throughout.  Squamocolumnar  junction was unremarkable.  No hernia was noted.   STOMACH:  The stomach had a large amount of bile in it.  It distended very  well with insufflation.  The folds of the proximal stomach were  normal.  Examination of the mucosa, revealed erythema of the antrum but no erosions  or ulcers were noted.  The pyloric channel was patent.  The angularis and  fundus were examined by retroflexing the scope and were normal.   DUODENUM:  Examination of the bulb, second and third part of the duodenum  was also normal.   The endoscope was withdrawn and the patient was prepared for procedure #2.   TOTAL COLONOSCOPY:  Rectal examination performed.  No abnormality noted on  external or digital exam.   The scope was placed in the rectum and advanced under vision into the  sigmoid colon and beyond.  Preparation was excellent.  Scope was passed to  the cecum which was identified by ileocecal valve and appendiceal stump.  Pictures were taken for the record as part of her data base.  As the scope  was withdrawn, colonic mucosa was once again carefully  examined and was  normal throughout.  There were 2 tiny diverticula of the sigmoid colon.  The  rectal mucosa was normal.   The scope was retroflexed to examine the anorectal junction.  There was a  tiny polyp proximal to the dentate line.  This was ablated by cold biopsy.  The endoscope was straightened and withdrawn.  The patient tolerated the  procedure well.   FINAL DIAGNOSES:  1. Antral gastritis felt to be related to alkaline _________ injury     secondary to bile.  The patient's Helicobacter pylori is negative.     Colonoscopy performed to the cecum.  2. Two tiny diverticula in sigmoid colon.  3. Small polyp ablated by cold biopsy from the rectum.   RECOMMENDATIONS:  1. Will try her on Carafate 1 gm a.c. and q.h.s.  She will resume her     medications as before.  2. I will be contacting the patient with biopsy results and further     recommendations.                                               Lionel December, M.D.    NR/MEDQ  D:  03/20/2002  T:  03/20/2002  Job:  [id number]   cc:   Lorin Picket A. Gerda Diss, M.D.

## 2010-11-27 NOTE — Consult Note (Signed)
[name], [name]                      ACCOUNT NO.:  [id number]   MEDICAL RECORD NO.:  [id number]                 PATIENT TYPE:   LOCATION:                                       FACILITY:   PHYSICIAN:  Lionel December, M.D.                 DATE OF BIRTH:   DATE OF CONSULTATION:  03/06/2002  DATE OF DISCHARGE:                           GASTROENTEROLOGY CONSULTATION   PRESENTING COMPLAINT:  Epigastric pain.  Family history of colon carcinoma.   HISTORY OF PRESENT ILLNESS:  The patient is a 60 year old Caucasian female  who was referred through the courtesy of Dr. Lilyan Punt  for GI  evaluation.  She is interested in having a colonoscopy.  Her family history  is positive for colon carcinoma, as reviewed in her family history.  She  presently does not have any symptoms of diarrhea, constipation, melena,  rectal bleeding, or recent change in her bowel habits.  She, however, is  complaining of sharp epigastric pain which started about six weeks ago.  She  has been on Naprosyn and chondroitin sulfate for knee pain, which was  discontinued, and she was begun on Nexium by Dr. Lilyan Punt; however, her  pain has not gone away completely.  She denies nausea, vomiting, anorexia,  or involuntary weight loss.  She also denies frequent heartburn, dysphagia,  hoarseness, or chronic cough.  There is no prior history of peptic ulcer  disease.  She has been diagnosed with IBS in the past.  She has been on  Levbid.  Every time she has tried to stop Levbid, she gets abdominal cramps.   MEDICATIONS:  1. Levbid 0.375 mg b.i.d.  2. Nexium 40 mg q.h.s.  3. Carafate 1 g four times a day.  4. ________ 1 q.d.  5. Effexor 150 mg q.h.s.  6. Seroquel 50 mg b.i.d. and 100 mg q.h.s.  7. Xanax 1 mg two or three times a day.  8. She does not take any OTC medications.   PAST MEDICAL HISTORY:  She has bipolar disorder and chronic anxiety and her  symptoms are uncontrolled with therapy.  She also has  GERD.  She had a  laparotomy with incidental appendectomy in November 1990.  At that time, she  was having abdominal pain and CT suggested a mass, which turned out to be a  pseudo mass.  She is status post cholecystectomy.  She had BSO with  hysterectomy in 1995.  She has had tonsillectomy several years ago.  She  also has bilateral knee arthritis.   ALLERGIES:  NK.   FAMILY HISTORY:  Mother is 44 years old and had mastectomy for breast  carcinoma within the last two years and is in remission.  Father is 15 and  has prostate CA with bone metastasis.  Both parents are in a nursing care  facility.  She has a brother in good health.  One sister had APR for rectal  carcinoma in her  early to mid 33s and she is now in remission.  Another  sister has some stress disorder.   SOCIAL HISTORY:  She is married.  She has three children.  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  She  never smoked cigarettes and does not drink alcohol.   PHYSICAL EXAMINATION:  GENERAL:  Pleasant, moderately obese Caucasian female  who is in no acute distress.  VITAL SIGNS:  She weighs 261 pounds.  She is 5 feet 4 inches tall.  Pulse 72  per minute, blood pressure 150/92, temperature is 98.6 degrees.  HEENT:  Conjunctiva is pink.  Sclera is non-icteric.  Oropharyngeal mucosa  is normal.  NECK:  Without masses or thyromegaly.  CARDIAC:  Regular rhythm.  Normal S1, S2.  No murmur or gallop noted.  LUNGS:  Clear to auscultation.  ABDOMEN:  Protuberant.  Bowel sounds are normal.  Palpation reveals soft  abdomen with mild tenderness across the lower abdomen without guarding or  rebound.  Mild tenderness also noted at the epigastrium.  No organomegaly or  masses.  RECTAL:  Deferred.  EXTREMITIES:  No peripheral edema or clubbing noted.   ASSESSMENT:  1. The patient's family history is positive for colon carcinoma in her     sister.  Family history is also positive for known  gastrointestinal     malignancies occurring in later years in her parents.  I agree with Dr.     Lilyan Punt that it is about time for her to have a colonoscopy.  2. Epigastric pain.  This started while she was on Naprosyn and chondroitin     sulfate.  Therapy has been discontinued and she is on Nexium but remains     with pain.  This could be a peptic ulcer disease in the setting of     Helicobacter pylori infection.  Need to rule out some other etiologies.   RECOMMENDATIONS:  1. Hemoccult x1.  2. Helicobacter pylori serology will be checked.  3. Colonoscopy will be planned within the next couple of weeks.  If     Helicobacter pylori is negative and she remains with epigastric pain,     would also consider esophagogastroduodenoscopy prior to CCS.   I would like to thank Dr. Lilyan Punt for giving [place] the opportunity to  participate in the patient's care.                                                  Lionel December, M.D.    NR/MEDQ  D:  03/06/2002  T:  03/07/2002  Job:  [id number]   cc:   Lorin Picket A. Gerda Diss, M.D.

## 2010-11-27 NOTE — Procedures (Signed)
[facility]  Patient:    [name], [name] Visit Number: [id number] MRN: [id number]          Service Type: OUT Location: RAD Attending Physician:  Ara Kussmaul Dictated by:   Lilyan Punt, M.D. Proc. Date: 11/28/01 Admit Date:  10/14/2001 Discharge Date: 10/14/2001                                Stress Test  PROCEDURE:  Stress test  PHYSICIAN:  Dr. Lilyan Punt.  INDICATIONS:  Chest discomfort.  PROTOCOL:  Bruce protocol.  DESCRIPTION OF PROCEDURE:  A resting electrocardiogram revealed normal sinus rhythm.  No acute ST segment changes are noted.  The heart rate response to exercise:  The patients heart rate quickly picked up in rate and hit her maximum heart rate of 145, approximately one minute into stage 2.  ST segment response to exercise:  There were no acute ST segment depressions.  Arrhythmia to exercise:  None.  Recovery phase:  Uneventful.  Blood pressure response:  The patient did have a mild hypertensive response.  INTERPRETATION:  Normal stress test with mild hypertensive response.  RECOMMENDATION:  The patient was encouraged to embark on a regular walking program, as best as her arthritic condition in her knees can tolerate.  To follow up in our office if further troubles, and for regular health checks. Dictated by:   Lilyan Punt, M.D. Attending Physician:  Ara Kussmaul DD:  11/28/01 TD:  11/28/01 Job: [id number]
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
## 2010-11-27 NOTE — H&P (Signed)
[facility]  Patient:    [name], [name]                      MRN: [id number] Adm. Date:  10/04/00 Attending:  Jasmine Pang, M.D. CC:         Eliezer Bottom, M.D.  Arbutus Ped, CCSW   Psychiatric Admission Assessment  IDENTIFYING INFORMATION:  This is a 60 year old Caucasian female referred by her outpatient psychiatrist, Eliezer Bottom, M.D.  HISTORY OF PRESENT ILLNESS:  Patient has a history of depression with previous hospitalizations x 2.  Her depressive symptoms have recently escalated with multiple neurovegetative symptoms.  These include difficulty falling asleep, difficulty concentrating, anergia, anhedonia, feelings of hopelessness and worthlessness.  She has a number of stressors including marital conflict.  She had previously separated from her husband due to his "excessive interest in homosexual pornography."  They had recently remarried but she states he is again involved in pornography.  She also is having significant stress at her job and feels unable to function.  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  She has been told by her supervisors that she needs to get her mood disorder under control before returning to work.  PAST PSYCHIATRIC HISTORY:  Patient sees Eliezer Bottom, M.D. at [facility] for medications.  She also sees Arbutus Ped, Administrator, Civil Service, at [facility] for therapy.  She has been at [facility] two times in the past for an inpatient stay due to depressive symptoms and difficulty functioning.  PAST MEDICAL HISTORY:  Patient has irritable bowel syndrome.  She has arthritis.  She has had a hysterectomy.  MEDICATIONS:  Seroquel 75 mg q.i.d., Xanax 1 mg q.i.d., Effexor XR 75 mg in the morning, estradiol and __________.  DRUG ALLERGIES:  No known drug  allergies.  FAMILY HISTORY:  Mother is bipolar.  Father has alcohol abuse problem.  SUBSTANCE ABUSE HISTORY:  None.  SOCIAL HISTORY:  Patient has had conflict with her husband as discussed in the history of present illness.  She has three children, but only one living in the home, a 50 year old daughter.  Her other two children are in college, a boy and a girl.  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  She denies any legal problems.  ADMISSION MENTAL STATUS EXAMINATION:  Patient was a casually-dressed Caucasian female who was cooperative.  She had poor eye contact.  There was psychomotor retardation.  Speech was soft and slow.  Mood was depressed and anxious. Affect tearful.  There was no suicidal or homicidal ideation.  There alcohol was no self-injurious behavior or aggression.  There was no psychosis or perceptual disturbance.  Thought processes were logical and goal directed. Thought content revealed no predominant theme.  On cognitive exam, patient was alert and oriented x 4.  Short-term and long-term memory were adequate. General fund of knowledge age and education level appropriate.  Attention and concentration diminished.  Judgment and insight were fair.  ADMISSION DIAGNOSES: Axis I:    Bipolar disorder, depressed-phase. Axis II:   Deferred. Axis III:  1. Arthritis.            2. Irritable bowel syndrome. Axis IV:   Severe. Axis V:    Global Assessment of Functioning:  Current 30; highest past year  70.  STRENGTHS AND ASSETS:  Patient has a help-seeking attitude.  She is engaging and verbal.  PROBLEMS:  Increasingly depressed mood with inability to function.  SHORT-TERM TREATMENT GOAL:  Improvement in functioning and ability to carry out ADLs.  LONG-TERM TREATMENT GOAL:  Resolution of depression and mood instability.  PLAN:  Will begin Lithobid 300 mg p.o. t.i.d. in addition to her other medications.  Will also increase Seroquel 100 mg p.o.  q.i.d.  Patient will have a family session with her husband to make a decision regarding their marriage.  She plans to live with a friend when she leaves the hospital until she and her husband can make some final decision.  ANTICIPATED LENGTH OF STAY:  Two to three days.  CONDITION NECESSARY FOR DISCHARGE:  Better able to function.  Less depressed.  POST-HOSPITAL CARE PLANS:  Will return to live with a friend who has a spare room she can move into.  Follow-up therapy will be with Arbutus Ped, CCSW, at [facility].  Follow-up medication management will be with Eliezer Bottom, M.D. at [facility]. DD: 10/06/00 TD:  10/07/00 Job: [id number]

## 2010-11-27 NOTE — Consult Note (Signed)
[facility]  Patient:    [name], [name] Visit Number: [id number] MRN: [id number]          Service Type: RHE Location: [facility] Attending Physician:  Aundra Dubin Dictated by:   Nathaneil Canary, M.D. Proc. Date: 05/18/01 Admit Date:  05/18/2001   CC:         Darreld Mclean, M.D.  Mel Almond, M.D.   Consultation Report  CHIEF COMPLAINT:  Insomnia and polyarthralgia.  HISTORY OF PRESENT ILLNESS:  [name] returns for follow-up since initially seeing her on March 23, 2001.  She has had a sleep study, which I am now seeing the results today.  She has "very mild sleep apnea."  She is still general tired and aching in many places.  Her arms, legs, and around the neck and shoulders are the worse areas.  She still has to arise very early to be at a job at 5:45 a.m.  Her weight is up 5 pounds.  There has been no swollen joints.  I feel that she is going through some further depression.  MEDICATIONS: 1. Seroquel 50 mg q.i.d. 2. Xanax 0.5 mg q.i.d. 3. Levbid 0.375 mg b.i.d. 4. Effexor 75 mg b.i.d. 5. Vivelle patch 0.1 mg. 6. Ibuprofen or Aleve p.r.n.  PHYSICAL EXAMINATION:  Weight 253 pounds.  VITAL SIGNS:  Blood pressure 110/80, respirations 16.  GENERAL APPEARANCE:  She appears somewhat tired.  SKIN:  Clear.  LUNGS:  Clear.  HEART:  Regular with no murmur.  MUSCULOSKELETAL:  The hands, wrists, elbows, shoulders, knees, and feet have a good range of motion and are all cool and nontender.  Trigger areas around the shoulder, neck, and along the paraspinous muscles were mildly to moderately tender.  ASSESSMENT AND PLAN: 1. Polyarthralgia and insomnia.  She also has a great deal of anxiety and    depression in her past medical history.  Would not add further medicines at    night for sleep because she is on Xanax and Seroquel.  I discussed using    possible Ultram or Xanax.  She feels that Aleve has helped in the past and    I have  given her a prescription of Naprosyn 500 mg b.i.d. p.r.n. with food.    I believe that she has a chronic aching condition.  This is complicated by    the fact that she has anxiety and depression.  We have discussed issues of    fibromyalgia and there is some overlap.  Her trigger points are not very    tender.  I will be glad to work with her in the future if her symptoms    worsen. 2. Obesity.  She is up 5 pounds.  I have encouraged her to cut back a little    on eating so that she might lose 1-2 pounds a month.  She will return on a p.r.n. basis. Dictated by:   Nathaneil Canary, M.D. Attending Physician:  Aundra Dubin DD:  05/18/01 TD:  05/19/01 Job: [id number]

## 2010-11-27 NOTE — Op Note (Signed)
[name], [name]             ACCOUNT NO.:  [id number]   MEDICAL RECORD NO.:  [id number]          PATIENT TYPE:  INP   LOCATION:  NA                           FACILITY:  [facility]   PHYSICIAN:  Erasmo Leventhal, M.D.DATE OF BIRTH:  [birth year]   DATE OF PROCEDURE:  08/30/2006  DATE OF DISCHARGE:                               OPERATIVE REPORT   PREOPERATIVE DIAGNOSES:  Left knee end-stage osteoarthritis.   POSTOPERATIVE DIAGNOSES:  Not given.   PROCEDURE:  Left total knee arthroplasty.   SURGEON:  Dr. Valma Cava.   ASSISTANT:  Leilani Able, PA-C.   ANESTHESIA:  Spinal with Duramorph.   ESTIMATED BLOOD LOSS:  Less than 100 mL.   DRAINS:  Two medium Hemovac.   COMPLICATIONS:  None.   DISPOSITION:  PACU stable.   DESCRIPTION OF PROCEDURE:  The patient was counseled in the holding  area, correct side had been identified. IV started, antibiotics were  given. Taken to the OR where the spinal anesthetic was administered.  ______ placed utilizing sterile technique by the OR circulating nurse.  The left lower extremity was examined and she had a 7 degree flexion  contracture with flexion to 120 degrees. We were very cautious with her  entire lower extremity. She has a history of a tibial fracture in the  past and we were extremely cautious with that. She was elevated, she was  prepped with DuraPrep and draped in a sterile fashion. Exsanguinated  with Esmarch, tourniquet was inflated to 350 mmHg. A straight midline  incision was made in the skin and subcutaneous tissue, small bleeders  were electrocoagulated, medial and lateral soft tissue flaps were  developed. Medial parapatellar arthrotomy was performed, the proximal  medial soft tissue released. The patella was retracted out of the way  but not everted, end-stage arthritis changes with bone against bone. The  cruciate ligaments were resected. A starting hole made in the distal  femur, canal was irrigated, effluent was  clear. Intramedullary rod was  gently placed. We chose an 11-mm cut off the distal femur and had a 5  degree valgus cut. Medial and lateral meniscus removed under direct  visualization. Geniculate vessels were coagulated. Posterior  neurovascular structures were thought of and protected throughout the  entire case. We used an extramedullary alignment rod due to the fact she  had a tibial fracture in the past. Alignment rotation was set  appropriately, we took a 10 mm cut off the lateral side which leads to a  ______ zero degree slope. The proximal tibia was found to be a size 2.5,  osteophytes removed, posterior medial and posterior femoral osteophytes  removed. With flexion extension blocks with a 10 block, we had excellent  gaps. Trials were then removed, tibia was exposed, tibial base plate was  applied, rotation covers were set, reamed and punched. The femoral box  cut was not prepared at this time with a size 3 femur, size 2.5 tibia,  #10 insert with good range of motion, soft tissue balance and alignment.  The patella was found to be a size 32. Appropriate amount of bone was  resected, locking holes were made. At this time, the patella tracked  anatomically. All trials removed utilizing modern cement technique, all  components were cemented into place, size 2.5 tibia, size 3 femur, size  32 patella. After the cement was cured, excess cement was removed.  _____10 and  12.5 trial and with a 12.5 trial, we had full extension,  varus and valgus stress, varus and valgus balance from 0 to 90 degrees  of flexion, flexion extension gaps well balanced, patellofemoral  tracking was anatomic. The trial was then removed, irrigated again and a  final 12.5 posterior stabilized rotating platform tibial insert was  implanted.   Two medium Hemovac drains were placed and again each knee was irrigated  and sequential closure in each layer of closure. Arthrotomy was closed  with Vicryl, subcu Vicryl,  skin closed with a subcuticular Monocryl  suture. Steri-Strips were applied, drains hooked to suction, sterile  dressing applied. The tourniquet was deflated. She had normal  circulation of the foot and ankle at the end of the case. There were no  complications or problems noted. Of note, we also closely examined the  tibia and ankle after the case and it was found to be examined without  complication or problem. The patient was then taken from the operating  room to PACU in stable condition.   To help with surgical technique and decision making, Mr. Leilani Able,  PA-C was needed throughout the entire case.           ______________________________  Erasmo Leventhal, M.D.     RAC/MEDQ  D:  08/30/2006  T:  08/31/2006  Job:  [id number]

## 2010-11-27 NOTE — H&P (Signed)
[name], RAE             ACCOUNT NO.:  [id number]   MEDICAL RECORD NO.:  [id number]         PATIENT TYPE:  LINP   LOCATION:                               FACILITY[facility]  [facility]   PHYSICIAN:  Erasmo Leventhal, M.D.DATE OF BIRTH:  September 09, 1950   DATE OF ADMISSION:  08/30/2006  DATE OF DISCHARGE:                              HISTORY & PHYSICAL   DATE OF SURGERY:  08/30/2006.   CHIEF COMPLAINT:  Left knee osteoarthritis.   HISTORY OF PRESENT ILLNESS:  This is a 60 year old lady with history of  end-stage osteoarthritis of her left knee with failure of conservative  treatment to alleviate her pain.  Due to continued pain and discomfort,  she is now scheduled for total knee arthroplasty of the left knee.  The  surgery, risks, benefits, and aftercare were discussed in detail with  the patient; questions invited and answered.  She has had medical  clearance from Dr. Lorin Picket A. Luking in [place], her medical doctor,  and surgery to go ahead as scheduled.  Risks and benefits were  discussed.  Questions were invited and answered.   DRUG ALLERGIES:  NORTRIPTYLINE with a rash, and ANTI-INFLAMMATORIES with  gastritis.   CURRENT MEDICATIONS INCLUDE:  1. Lopid 600 mg b.i.d.  2. Alprazolam XR 3 mg q.a.m.  3. Hyoscyamine 0.375 mg one daily.  4. Glucosamine chondroitin.  5. Seroquel 50 mg 4 tablets q.h.s.  6. Estradiol 1 mg one p.o. daily.  7. Occasional Vicodin.   PAST SURGERIES INCLUDE:  1. Bilateral knee arthroscopies.  2. Appendectomy.  3. Cholecystectomy.  4. Hysterectomy.   SERIOUS MEDICAL ILLNESSES INCLUDE:  1. Hyperlipidemia.  2. Depression.  3. Anxiety.   FAMILY HISTORY:  Positive for hypertension, diabetes, coronary artery  disease, and cancer.   SOCIAL HISTORY:  The patient is married.  She is an Astronomer. she does not  smoke and does not drink.   REVIEW OF SYSTEMS:  CENTRAL NERVOUS SYSTEM:  Positive for depression.  Negative for blurred vision or dizziness.   PULMONARY: No shortness  breath, PND or orthopnea.  CARDIOVASCULAR:  Negative for chest pain or  palpitation.  GI: Positive for history of reflux and hiatal hernia.  GU:  Negative for urinary tract difficulty.  MUSCULOSKELETAL:  Positive a in  history of present illness.   PHYSICAL EXAM:  VITAL SIGNS:  BP 140/92, respirations 18, pulse 78 and  regular.  GENERAL:  This is a well-developed, well-nourished lady in no acute  distress.  HEENT:  Head normocephalic.  Nose patent.  Ears patent.  Pupils equal,  round, and react to light.  Throat without injection.  NECK:  Supple without adenopathy.  Carotids 2+ without bruit.  CHEST:  Clear to auscultation rales or rhonchi.  Respirations 18.  HEART:  Regular rate at 78 beats per without murmur.  ABDOMEN:  Soft with active bowel sounds.  No masses or organomegaly.  NEUROLOGIC:  The patient alert and oriented to time, place, and person.  Cranial nerves II-XII grossly intact.  EXTREMITIES:  Shows a left knee  with valgus deformity with 0-130 degree range of motion.  Dorsalis pedis  and posterior tibialis pulses are 2+.  Sensation and circulation are  intact.  X-rays show end-stage osteoarthritis left knee.   IMPRESSION:  End-stage osteoarthritis left knee.   PLAN:  Total knee arthroplasty left knee.      Jaquelyn Bitter. Chabon, P.A.    ______________________________  Erasmo Leventhal, M.D.    SJC/MEDQ  D:  08/15/2006  T:  08/15/2006  Job:  [id number]

## 2010-11-27 NOTE — Discharge Summary (Signed)
[facility]  Patient:    [name], [name]                    MRN: [id number] Adm. Date:  [id number] Disc. Date: [id number] Attending:  Benjaman Pott CC:         Eliezer Bottom, M.D.             [employer].             [employer], CCSW.                           Discharge Summary  REASON FOR ADMISSION:  The patient was a 60 year old Caucasian female referred by her outpatient psychiatrist, Rupinder Evelene Croon.  She has a history of depression with previous hospitalizations x 2.  Her depressive symptoms had worsened recently with escalating neurovegetative symptoms and suicidal ideation.  She has multiple stressors at home and at work and is unable to be managed safely in an outpatient setting.  She currently sees Milagros Evener, M.D. in the [facility].  She also sees [employer], CCSW.  For further admission information, see psychiatric admission assessment.  PHYSICAL EXAMINATION:  This was done by [facility], P.A.C.  The patient was obese.  She had irritable bowel syndrome.  She was status post hysterectomy with hormone replacement therapy.  She was status post cholecystectomy, status post appendectomy.  ADMISSION LABORATORY DATA:  CBC with differential was grossly within normal limits.  TSH and free T4 were within normal limits.  HOSPITAL COURSE:  Upon admission, the patient was continued on her home medications of Seroquel 7.5 mg p.o. q.i.d., Xanax 1 mg p.o. q.i.d., Levbid 0.375 mg p.o. b.i.d., ibuprofen 400 mg p.o. q.i.d. p.r.n. pain, Effexor XR 75 mg p.o. q.d.  On October 06, 2000, the patient was started on Lithobid 300 mg p.o. t.i.d., Seroquel was increased to 100 mg p.o. q.i.d.  She tolerated these medications well with no significant side effects except for some sedation. She will get an a.m. lithium level on an outpatient basis.  She was able to participate appropriately in unit therapeutic  groups and activities.  At the time of discharge, she was felt able to be safely managed in an outpatient setting.  DISCHARGE MENTAL STATUS EXAMINATION:  Better eye contact with less psychomotor retardation.  Speech was still soft and slow.  Mood: Less depressed and anxious.  Affect: Able to reveal wider range.  No suicidal or homicidal ideation, no self-injurious behavior or aggression, no psychosis or perceptual disturbance.  Thought processes were logical and goal directed.  Cognitive: Back to baseline.  Judgment: Good.  Insight: Fair.  DISCHARGE DIAGNOSES: Axis I:    Bipolar disorder, depressed phase, severe. Axis II:   None. Axis III:  1. Arthritis.            2. Irritable bowel syndrome.            3. Obesity.            4. Status post hysterectomy, cholecystectomy, and appendectomy. Axis IV:   Severe. Axis V:    Global assessment of functioning current is 50, highest past year            70, admission status was 30.  DISCHARGE MEDICATIONS: 1. Lithobid 300 mg t.i.d. 2. Seroquel 200 mg p.o. b.i.d. 3. Effexor XR 75 mg q.d. 4. Alprazolam 1 mg q.i.d.  ACTIVITY LEVEL:  No restrictions.  DIET:  No restrictions.  POST HOSPITAL CARE PLAN:  The patient will be seen in the Intensive Outpatient Program starting October 10, 2000. DD:  10/31/00 TD:  11/01/00 Job: [id number]

## 2011-02-05 ENCOUNTER — Other Ambulatory Visit: Payer: Self-pay | Admitting: Specialist

## 2011-02-05 ENCOUNTER — Encounter (HOSPITAL_COMMUNITY): Payer: BC Managed Care – PPO

## 2011-02-05 LAB — URINALYSIS, ROUTINE W REFLEX MICROSCOPIC
Bilirubin Urine: NEGATIVE
Ketones, ur: NEGATIVE mg/dL
Leukocytes, UA: NEGATIVE
Nitrite: NEGATIVE
Protein, ur: NEGATIVE mg/dL
Specific Gravity, Urine: 1.024 (ref 1.005–1.030)
pH: 6.5 (ref 5.0–8.0)

## 2011-02-05 LAB — DIFFERENTIAL
Basophils Absolute: 0 10*3/uL (ref 0.0–0.1)
Basophils Relative: 1 % (ref 0–1)
Eosinophils Relative: 2 % (ref 0–5)
Lymphs Abs: 1.7 10*3/uL (ref 0.7–4.0)
Monocytes Absolute: 0.6 10*3/uL (ref 0.1–1.0)
Monocytes Relative: 10 % (ref 3–12)
Neutro Abs: 3.7 10*3/uL (ref 1.7–7.7)
Neutrophils Relative %: 60 % (ref 43–77)

## 2011-02-05 LAB — COMPREHENSIVE METABOLIC PANEL
Albumin: 4 g/dL (ref 3.5–5.2)
Alkaline Phosphatase: 113 U/L (ref 39–117)
BUN: 22 mg/dL (ref 6–23)
GFR calc Af Amer: >60 mL/min (ref >60)
Potassium: 4.1 mEq/L (ref 3.5–5.1)
Sodium: 138 mEq/L (ref 135–145)
Total Protein: 7.6 g/dL (ref 6.0–8.3)

## 2011-02-05 LAB — CBC
MCH: 29.4 pg (ref 26.0–34.0)
MCHC: 31.2 g/dL (ref 30.0–36.0)
Platelets: 289 10*3/uL (ref 150–400)
WBC: 6.2 10*3/uL (ref 4.0–10.5)

## 2011-02-05 LAB — SURGICAL PCR SCREEN: MRSA, PCR: NEGATIVE

## 2011-02-05 LAB — PROTIME-INR: INR: 0.98 (ref 0.00–1.49)

## 2011-02-12 ENCOUNTER — Inpatient Hospital Stay (HOSPITAL_COMMUNITY)
Admission: RE | Admit: 2011-02-12 | Discharge: 2011-02-15 | DRG: 558 | Disposition: A | Discharge status: Home Health | Payer: BC Managed Care – PPO | Source: Ambulatory Visit | Attending: Specialist | Admitting: Specialist

## 2011-02-12 DIAGNOSIS — Z96659 Presence of unspecified artificial knee joint: Secondary | ICD-10-CM

## 2011-02-12 DIAGNOSIS — D62 Acute posthemorrhagic anemia: Secondary | ICD-10-CM | POA: Diagnosis not present

## 2011-02-12 DIAGNOSIS — Z01812 Encounter for preprocedural laboratory examination: Secondary | ICD-10-CM

## 2011-02-12 DIAGNOSIS — IMO0002 Reserved for concepts with insufficient information to code with codable children: Secondary | ICD-10-CM | POA: Diagnosis not present

## 2011-02-12 DIAGNOSIS — M1710 Unilateral primary osteoarthritis, unspecified knee: Principal | ICD-10-CM | POA: Diagnosis present

## 2011-02-12 DIAGNOSIS — F411 Generalized anxiety disorder: Secondary | ICD-10-CM | POA: Diagnosis present

## 2011-02-12 DIAGNOSIS — E785 Hyperlipidemia, unspecified: Secondary | ICD-10-CM | POA: Diagnosis present

## 2011-02-12 DIAGNOSIS — F319 Bipolar disorder, unspecified: Secondary | ICD-10-CM | POA: Diagnosis present

## 2011-02-12 DIAGNOSIS — Y831 Surgical operation with implant of artificial internal device as the cause of abnormal reaction of the patient, or of later complication, without mention of misadventure at the time of the procedure: Secondary | ICD-10-CM | POA: Diagnosis not present

## 2011-02-12 DIAGNOSIS — Z853 Personal history of malignant neoplasm of breast: Secondary | ICD-10-CM

## 2011-02-13 LAB — CBC
HCT: 39.2 % (ref 36.0–46.0)
Hemoglobin: 12.9 g/dL (ref 12.0–15.0)
MCH: 30.9 pg (ref 26.0–34.0)
MCHC: 32.9 g/dL (ref 30.0–36.0)
Platelets: 280 10*3/uL (ref 150–400)
RBC: 4.17 MIL/uL (ref 3.87–5.11)

## 2011-02-13 LAB — BASIC METABOLIC PANEL
BUN: 15 mg/dL (ref 6–23)
Calcium: 8.7 mg/dL (ref 8.4–10.5)
Chloride: 97 mEq/L (ref 96–112)
GFR calc Af Amer: >60 mL/min (ref >60)
GFR calc non Af Amer: >60 mL/min (ref >60)
Glucose, Bld: 154 mg/dL — ABNORMAL HIGH (ref 70–99)

## 2011-02-14 LAB — BASIC METABOLIC PANEL
CO2: 31 mEq/L (ref 19–32)
Calcium: 9.7 mg/dL (ref 8.4–10.5)
GFR calc Af Amer: >60 mL/min (ref >60)
Glucose, Bld: 137 mg/dL — ABNORMAL HIGH (ref 70–99)
Sodium: 137 mEq/L (ref 135–145)

## 2011-02-14 LAB — CBC
Hemoglobin: 11 g/dL — ABNORMAL LOW (ref 12.0–15.0)
MCH: 30.5 pg (ref 26.0–34.0)
MCHC: 32.6 g/dL (ref 30.0–36.0)
MCV: 93.4 fL (ref 78.0–100.0)
Platelets: 265 10*3/uL (ref 150–400)
RDW: 13.5 % (ref 11.5–15.5)

## 2011-02-15 LAB — CBC
HCT: 31.1 % — ABNORMAL LOW (ref 36.0–46.0)
Hemoglobin: 10.2 g/dL — ABNORMAL LOW (ref 12.0–15.0)
MCH: 30.4 pg (ref 26.0–34.0)
MCHC: 32.8 g/dL (ref 30.0–36.0)
MCV: 92.6 fL (ref 78.0–100.0)
Platelets: 271 K/uL (ref 150–400)
RBC: 3.36 MIL/uL — ABNORMAL LOW (ref 3.87–5.11)
RDW: 13.8 % (ref 11.5–15.5)
WBC: 9.5 K/uL (ref 4.0–10.5)

## 2011-02-19 ENCOUNTER — Emergency Department (HOSPITAL_COMMUNITY)
Admission: EM | Admit: 2011-02-19 | Discharge: 2011-02-19 | Disposition: A | Discharge status: Home / Self Care | Payer: BC Managed Care – PPO | Attending: Emergency Medicine | Admitting: Emergency Medicine

## 2011-02-19 ENCOUNTER — Encounter: Payer: Self-pay | Admitting: *Deleted

## 2011-02-19 DIAGNOSIS — F319 Bipolar disorder, unspecified: Secondary | ICD-10-CM | POA: Insufficient documentation

## 2011-02-19 DIAGNOSIS — G8918 Other acute postprocedural pain: Secondary | ICD-10-CM

## 2011-02-19 HISTORY — DX: Anxiety disorder, unspecified: F41.9

## 2011-02-19 HISTORY — DX: Bipolar disorder, unspecified: F31.9

## 2011-02-19 HISTORY — DX: Malignant (primary) neoplasm, unspecified: C80.1

## 2011-02-19 HISTORY — DX: Pure hypercholesterolemia, unspecified: E78.00

## 2011-02-19 NOTE — ED Notes (Signed)
Pt had total knee replacement of right knee on 02/12/2011.  PT sent pt here due to increased pain/swelling in right calf for ? DVT.

## 2011-02-19 NOTE — ED Notes (Signed)
U/S scheduled for 0900 Sat 02-20-11.  0830 to Register.

## 2011-02-19 NOTE — Discharge Instructions (Signed)
 Please return to [facility] tomorrow morning to have your right lower leg ultrasound at that time instructed by the radiology department.

## 2011-02-19 NOTE — ED Provider Notes (Signed)
History    Scribed for [name]. Shin Lamour, MD, the patient was seen in room APA07/APA07. This chart was scribed by Clarita Crane. This patient's care was started at 5:34PM.  CSN: [id number] Arrival date & time: 02/19/2011  5:27 PM  Chief Complaint  Patient presents with  . Leg Pain   HPI Patient is a 60 year old female c/o worsening RLE pain and swelling onset today while at physical therapy. Denies chest pain, SOB, fever, drainage, numbness, tingling, diaphoresis. Patient reports she had a right knee replacement performed 1 week ago by Dr. Thomasena Edis and is currently on 40mg  Lovenox nightly. Notes she called Dr. Thomasena Edis and was referred to ED for evaluation for possible DVT. States she has been able to ambulate and perform rehab exercise at physical therapy since having surgery performed. Patient is a non-smoker and non-drinker.    PAST MEDICAL HISTORY:  Past Medical History  Diagnosis Date  . Bipolar 1 disorder   . Anxiety   . Cancer   . High cholesterol     PAST SURGICAL HISTORY:  Past Surgical History  Procedure Date  . Total knee arthroplasty right knee  . Cholecystectomy   . Abdominal hysterectomy   . Breast lumpectomy left     MEDICATIONS:  Previous Medications   No medications on file     ALLERGIES:  Allergies as of 02/19/2011  . (No Known Allergies)     FAMILY HISTORY:  No family history on file.   SOCIAL HISTORY: History   Social History  . Marital Status: Married    Spouse Name: N/A    Number of Children: N/A  . Years of Education: N/A   Social History Main Topics  . Smoking status: Never Smoker   . Smokeless tobacco: None  . Alcohol Use: No  . Drug Use: No  . Sexually Active:    Other Topics Concern  . None   Social History Narrative  . None     Review of Systems 10 Systems reviewed and are negative for acute change except as noted in the HPI.  Physical Exam  BP 144/74  Pulse 86  Temp(Src) 98.1 F (36.7 C) (Oral)  Resp 20  Ht 5\' 4"   (1.626 m)  Wt 280 lb (127.007 kg)  BMI 48.06 kg/m2  SpO2 98%  Physical Exam  Nursing note and vitals reviewed. Constitutional: She is oriented to person, place, and time. She appears well-developed and well-nourished.  HENT:  Head: Normocephalic and atraumatic.  Eyes: Pupils are equal, round, and reactive to light.  Neck: Neck supple.  Cardiovascular: Normal rate and regular rhythm.   No murmur heard. Pulmonary/Chest: Effort normal and breath sounds normal. She has no wheezes.  Musculoskeletal: Normal range of motion. She exhibits edema.       Gauze dressing over incision site of right knee with no apparent purulent drainage or erythema about incision site or right knee. Old ecchymosis of right knee and right lower leg. RLE with mild swelling assymmetric to LLE. Appearance of right knee is consistent with post surgical status reported by patient.   Neurological: She is alert and oriented to person, place, and time. No sensory deficit.  Skin: Skin is warm and dry. She is not diaphoretic.  Psychiatric: She has a normal mood and affect. Her behavior is normal.    ED Course  Procedures  OTHER DATA REVIEWED: Nursing notes, vital signs, and past medical records reviewed. Consult with Orthopedic surgeon    DIAGNOSTIC STUDIES:    LABS /  RADIOLOGY:   PROCEDURES:  ED COURSE / COORDINATION OF CARE: 6:05PM- Consult complete with Dr. Epimenio Foot Surgeon. Patient case explained and discussed. Course of treatment discussed and determined. Dr. Charlann Boxer recommends patient takes usual preventative dose of Lovenox and return tomorrow morning for [place].   MDM: Ultrasound is currently unavailable at this time when pt arrived to the ED.   I spoke to Dr. Charlann Boxer covering for Dr. Thomasena Edis.  He feels pt is fine to continue her usual dose of preventative lovenox and simply to return tomorrow for a ultrasound of her leg and otherwise can follow up with Dr. Thomasena Edis as needed.  Pt has no CP, SOB, fevers, or  signs of infection or immediate, significant post operative complications.     PLAN:discharge The patient is to return the emergency department if there is any worsening of symptoms. I have reviewed the discharge instructions with the patient/family   CONDITION ON DISCHARGE: stable   MEDICATIONS GIVEN IN THE E.D. Medications - No data to display   I personally performed the services described in this documentation, which was scribed in my presence. The recorded information has been reviewed and considered. [name]. Oletta Lamas, MD     [name]. Saydee Zolman, MD 02/19/11 1820

## 2011-02-20 ENCOUNTER — Ambulatory Visit (HOSPITAL_COMMUNITY)
Admit: 2011-02-20 | Discharge: 2011-02-20 | Disposition: A | Discharge status: Home / Self Care | Payer: BC Managed Care – PPO | Source: Ambulatory Visit | Attending: Emergency Medicine | Admitting: Emergency Medicine

## 2011-02-20 ENCOUNTER — Other Ambulatory Visit (HOSPITAL_COMMUNITY): Payer: Self-pay | Admitting: Emergency Medicine

## 2011-02-20 DIAGNOSIS — R609 Edema, unspecified: Secondary | ICD-10-CM

## 2011-02-20 DIAGNOSIS — M7989 Other specified soft tissue disorders: Secondary | ICD-10-CM | POA: Insufficient documentation

## 2011-02-22 NOTE — Discharge Summary (Signed)
[name], [name]             ACCOUNT NO.:  [id number]  MEDICAL RECORD NO.:  [id number]  LOCATION:  1606                         FACILITY:  [facility]  PHYSICIAN:  Erasmo Leventhal, M.D.DATE OF BIRTH:  1950/12/02  DATE OF ADMISSION:  02/12/2011 DATE OF DISCHARGE:  02/15/2011                              DISCHARGE SUMMARY   ADMISSION DIAGNOSES: 1. End-stage osteoarthritis, right knee, with a history of left total     knee arthroplasty. 2. Bipolar disorder. 3. Anxiety. 4. Hyperlipidemia. 5. History of gastritis. 6. History of stress incontinence. 7. History of breast cancer.  DISCHARGE DIAGNOSES: 1. Right total knee arthroplasty without complications. 2. Sensitivity to Lovenox, change from b.i.d. dosing to once a day     dosing. 3. Postoperative blood loss anemia, asymptomatic, rule out self-     directed p.o. supplementation. 4. Bipolar disorder, stable. 5. Anxiety disorder, stable. 6. Hyperlipidemia. 7. History of gastritis. 8. History of stress incontinence. 9. History of breast cancer.  HISTORY OF PRESENT ILLNESS:  The patient is a 60 year old female, well known to Dr. Thomasena Edis, previously had a left total knee arthroplasty, did very well.  The patient continued to have problems with her advanced arthritis of her right knee.  She has failed conservative treatments including injections, medications, physical therapy, and arthroscopies. The patient elected to proceed with a total knee arthroplasty due to pain and difficulty getting around.  ALLERGIES: 1. NSAIDs, due to gastritis. 2. IMIPRAMINE causing a rash.  PRIMARY CARE PHYSICIAN:  Scott A. Gerda Diss, MD.  PSYCHIATRISTMilagros Evener, M.D.  MEDICATIONS ON ADMISSION:  Lamictal, Effexor, alprazolam, Seroquel, Tylenol, multivitamins, calcium, vitamin D, fish oil, glucosamine.  SURGICAL PROCEDURES:  On February 12, 2011, the patient was taken to the OR by Dr. Valma Cava and assisted by, Oneida Alar, PA-C, under  spinal and MAC anesthesia.  The patient underwent a right total knee arthroplasty without any complications.  Minimal blood loss to the tourniquet use. The patient was transferred to the recovery room then to the orthopedic floor in good condition.  Follow total knee protocol.  The patient had the following components implanted, a size 3 right sigma femoral component, a size 3 MBT keel tibial tray, a size three 10 mm thickness polyethylene bearing, a size 35 mm 3 peg patella.  All components were implanted with polymethyl methacrylate.  CONSULTS:  Further routine consults requested; physical therapy, case management.  HOSPITAL COURSE:  On February 12, 2011, the patient was admitted to [facility] under the care of Dr. Valma Cava.  The patient was taken to the OR where a right total knee arthroplasty was performed without any complications.  The patient was transferred to recovery room then to orthopedic floor in good condition.  The patient followed total knee protocol on IV antibiotics, pain medicines, and Lovenox 30 mg subcu twice a day for DVT prophylaxis.  First postop day, the patient was doing very well.  She started to develop a little drainage on her dressing, but her Hemovac drain was discontinued without any complications.  She starts CPM.  Her vital signs were stable.  Her pain was well controlled.  Postop day #2, the patient had no complaints.  Her  vital signs were stable.  She was fairly comfortable.  The dressing was taken down.  She did have a fairly large collection of blood and hematoma outside the wound in the dressing from slow drainage from the wound site and the Hemovac drain site.  The patient's wound was cleaned, prepped with Betadine, steri-stripped, and redressed in a clean fashion. The patient was then placed on Keflex due to continued drainage.  With Dr. Thomasena Edis and my discussion, we changed her Lovenox to once a day dosing, I think that maybe she  is a little sensitive to this.  The patient tolerated her physical therapy and CPM well that day.  Postop day #3, the patient had no complaints.  She was feeling good.  She actually had a good night's sleep.  She did well with physical therapy the day previous.  Her vital signs were stable.  Hemoglobin dropped to 10.2 over 31.1.  Changing her dressing again, the dressing today was clean and dry.  Minimal drainage from the dressing.  The wound was clean, no signs of infection, no blisters, no further drainage from the incision or Hemovac portal site.  Her thigh and calf were soft, nontender.  She had negative Homans.  She had good motion and sensation on the foot with pulses.  It was felt that, at that time, the patient was medically and orthopedically stable, ready for discharge home with current medications.  The patient had been able to ambulate 90 feet with minimal assist/guarding by physical therapy the day before.  LABORATORY DATA:  CBC on February 15, 2011, showing WBCs 9.5, hemoglobin 10.2, hematocrit 31.1, platelets 271.  Routine chemistries on August 5th found sodium of 137, potassium of 4.3, BUN 8, creatinine 0.5, glucose 137.  DISCHARGE INSTRUCTIONS:  ACTIVITY:  The patient is to slowly increase her activity with the use of walker as instructed by Physical Therapy.  DIET:  No restrictions.  WOUND CARE:  She is to keep her wound clean and dry.  She is to keep her present dressing intact until tomorrow, August 7th when she will change the dressing and just cover with also 4x4s and tape.  She is going to keep her wound dry until we see her in the office.  She is to call if she has any signs of concerns of infection.  FOLLOWUP:  She has a 2-week followup appointment with Dr. Thomasena Edis.  The patient is to call [phone] for that followup appointment.  Home health physical therapy through [place].  MEDICATIONS: 1. Cephalexin 500 mg 1 tablet 4 times a day for a total of 3 days  due     to extra length of the time draining from the wound     prophylactically. 2. Lovenox 40 mg injection once at bedtime for a total of 14 days. 3. Ferrous sulfate 325 mg 1 tablet 3 times a day for 30 days. 4. Robaxin 500 mg once every 6 hours for muscle spasms if needed. 5. OxyIR 5 mg 1-2 tablets every 3 hours for pain if needed. 6. Alprazolam 1 mg twice a day. 7. Gemfibrozil 600 mg twice a day. 8. Lamotrigine 200 mg once at bedtime. 9. Multivitamins once a day. 10.Quetiapine 200 mg once at bedtime. 11.Venlafaxine XR 225 mg once a day every morning. 12.She is going to hold on her glucosamine chondroitin.  The patient's condition upon discharge to home is listed as improved and good.     Jamelle Rushing, P.A.   ______________________________ Erasmo Leventhal, M.D.  RWK/MEDQ  D:  02/15/2011  T:  02/15/2011  Job:  [id number]  cc:   Lorin Picket A. Gerda Diss, MD Fax: [phone]  Milagros Evener, M.D. Fax: [phone]  Electronically Signed by Arlyn Leak P.A. on 02/19/2011 08:24:17 AM Electronically Signed by Eugenia Mcalpine M.D. on 02/22/2011 03:38:42 PM

## 2011-02-22 NOTE — Op Note (Signed)
[name], [name]             ACCOUNT NO.:  [id number]  MEDICAL RECORD NO.:  [id number]  LOCATION:  1606                         FACILITY:  [facility]  PHYSICIAN:  Erasmo Leventhal, M.D.DATE OF BIRTH:  02-18-1951  DATE OF PROCEDURE: DATE OF DISCHARGE:                              OPERATIVE REPORT   PREOPERATIVE DIAGNOSIS:  Right knee end-stage osteoarthritis.  POSTOPERATIVE DIAGNOSES:  Right knee end-stage osteoarthritis.  PROCEDURE:  Right total knee arthroplasty.  SURGEON:  Erasmo Leventhal, M.D.  ASSISTANT:  Jamelle Rushing, P.A-C.  ANESTHESIA:  Spinal monitored anesthesia care.  BLOOD LOSS:  Less than 100 cc.  DRAINS:  One Hemovac.  COMPLICATIONS:  None.  TOURNIQUET TIME:  90 minutes at 350 mmHg.  COMPLICATIONS:  None.  DISPOSITION:  PACU stable.  OPERATIVE IMPLANTS:  [employer] sigma size three femur, size three tibia, 10 mm posterior stabilized rotating platform tibial insert.  A 35 mm all-polyethylene patella, all cemented.  OPERATIVE DETAILS:  The patient was counseled in the holding area.  IV was started.  On the way to the operating room, IV antibiotics were given.  Spinal anesthetic was administered.  Foley catheter was placed utilizing sterile technique by the OR certified nurse.  All extremities were well padded and bumped.  Right knee had 5 degree of flexion contracture and flexed to 100 degrees due to large body habitus, was elevated, prepped with DuraPrep and draped in sterile fashion. Exsanguinated with an Esmarch.  Tourniquet was inflated to 350 mmHg incrementally.  A straight midline incision was made through the skin and subcutaneous tissue.  __________ soft tissue flaps were developed at appropriate level.  A medial parapatellar arthrotomy was performed plus medial soft tissue release done.  Knee was then flexed.  End-stage arthritis in all 3 compartments.  Osteophytes were removed from the intracondylar notch.  Cruciate  ligaments resected.  Starter hole was made in the distal femur.  Canal was irrigated until the fluid was clear.  Intramedullary rod was gently placed.  I chose a 5-degree valgus cut to 11, cut off distal femur due to her flexion contracture.  Femur was found to be a size 3, rotation marks were set, and cut to size 3. Medial menisci removed under direct visualization.  Genicular vessels were coagulated and neurovascular structures were protected.  Tibia was subluxed anteriorly.  Extramedullary rod was used on tibia. We made an  8 mm cut off the least deficient side, the  lateral side at 0 degree slope  __________ removed with osteotome.  Flexion and extension blocks for 10 insert, well balanced.  Tibia subluxed anteriorly, found be a size 3 reamer and punch was performed.  A femoral box cut at this time, a size 3 tibia, size 3 femur __________ .  We had a well-aligned knee with full extension, flexion 110, limited by body habitus, stable to varus-valgus stress 0 to 36 with 90 degrees with 90 degrees of flexion.  Patella was found to be size 35.  Appropriate amount of bone was resected __________.  All trials removed.  Knee was irrigated pulsatile lavage. Utilizing modern cement technique, all components were cemented into place, size 3 tibia, size 3 femur, 35  patella with 10 trial insert, cemented, cured.  The 10 trial insert was excellent with full extension, flexion to 110.  Varus-valgus stable, 0 to 36 with 90 degrees of flexion.  Trial was removed.  Excess cement was removed.  Knee was again irrigated.  Gelfoam was placed posteriorly, subluxed tibia, subluxed anterior, and final 10 mm posterior stabilized rotating platform tibial insert was implanted.  We had excellent well-aligned knee, well-balanced knee.  A medium Hemovac drain was placed.  The arthrotomy was closed with 90 degrees of flexion closed with Vicryl interrupted in figure-of-8 fashion, subcu with Vicryl, skin  with Monocryl suture.  Steri-Strips were applied.  Drain was hooked to suction. Compress wrap was applied. Tourniquet was deflated.  She tolerated the procedure with no complications or problems.  She is awakened and taken from operating room to PACU in stable condition.          ______________________________ Erasmo Leventhal, M.D.     RAC/MEDQ  D:  02/12/2011  T:  02/13/2011  Job:  [id number]  Electronically Signed by Eugenia Mcalpine M.D. on 02/22/2011 03:38:40 PM

## 2011-02-22 NOTE — H&P (Signed)
[name], [name]             ACCOUNT NO.:  [id number]  MEDICAL RECORD NO.:  [id number]  LOCATION:  1S                           FACILITY:  [facility]  PHYSICIAN:  Erasmo Leventhal, M.D.DATE OF BIRTH:  1951-01-29  DATE OF ADMISSION:  01/19/2011 DATE OF DISCHARGE:                             HISTORY & PHYSICAL   CHIEF COMPLAINT:  Painful loss of range of motion of right knee.  PRESENT ILLNESS:  The patient is a 60 year old female with a history of left total knee arthroplasty performed by Dr. Thomasena Edis.  The patient has had continued treatment for advanced arthritis of her right knee.  She has failed conservative treatment including injections, medications, physical therapy, and knee arthroscopies.  The patient would like to proceed with a total knee arthroplasty as her right knee is not improving and it is starting to affect her activities of daily living.  ALLERGIES: 1. NSAIDs intolerant due to gastritis. 2. IMIPRAMINE with a rash.  PRIMARY CARE PHYSICIAN:  Scott A. Gerda Diss, MD  PSYCHIATRIST:  Milagros Evener, MD  CURRENT MEDICATIONS: 1. Lamictal 200 mg q.h.s. 2. Effexor 225 mg in the a.m. 3. Alprazolam 1 mg b.i.d. 4. Seroquel 200 mg q.h.s. 5. Tylenol 2 tablets in the morning. 6. Multivitamins. 7. Calcium. 8. Vitamin D. 9. Fish oil. 10.Glucosamine-chondroitin.  PAST MEDICAL HISTORY: 1. Anxiety and bipolar disease, bipolar depression. 2. History of gastritis. 3. Hypercholesterolemia with elevated triglycerides. 4. History of urinary incontinence. 5. History of the left breast cancer. 6. Degenerative disk disease of the lower back.  REVIEW OF SYSTEMS:  NEUROLOGIC:  She states she is fairly well- controlled on her bipolar medications of anxiety through her psychiatrist.  She has had no recent feelings like she use to hurt someone else or herself.  She denies any alcohol or drug issues.  She denies any strokes, seizures, convulsions.  PULMONARY:  She denies  any shortness breath or productive cough.  She denies any history of asthma, bronchitis, COPD, sleep apnea.  She has been tested in the past and was negative.  Never tested positive for tuberculosis.  CARDIOVASCULAR:  She denies any issues related to irregular heart rhythms, chest pains.  She denies any history of congestive heart failure, heart attack, valvular disease.  She does have some varicose veins and some elevated cholesterol and triglycerides.  GI:  She denies any reflux disease.  She does have some gastritis related to medications in the past.  No hepatitis, jaundice, gallbladder issues, irritable bowel, diverticular problems.  GU:  She does have some stress urinary incontinence.  No stones or kidney failure.  ENDOCRINE:  She denies any thyroid or diabetic issues.  HEMATOLOGIC:  She denies any anemia, blood clots in the past.  She has had breast cancer in the past.  SURGICAL PROCEDURES:  She has had an exploratory laparoscopic procedure 20 years ago.  Hysterectomy in 1995, lumpectomy in 2008, left knee replacement in 2008, and bilateral knee arthroscopies without any problems with anesthesia.  FAMILY MEDICAL HISTORY:  Father is deceased at the age of 56 with blood pressure.  Mother is deceased at 2 with mental illness and diabetes.  SOCIAL HISTORY:  She is married.  She works as an  RN.  She has never smoked.  No alcohol.  She lives alone.  Her husband has severe mental illness and lives in a group home.  She does have daughter and son who will provide care for her postop.  She lives in a [place] home.  PHYSICAL EXAMINATION:  VITAL SIGNS:  Height is 5 feet 4 inches, weight is 280 pounds.  Blood pressure is 138/82, pulse of 80 and regular, respirations 12 and nonlabored, patient is afebrile. GENERAL:  This is a healthy-appearing, well-developed female, conscious, alert, and appropriate, appears to be a good historian, and appears to have very balanced affect today. NECK:   She has good range of motion of her cervical spine.  No palpable lymphadenopathy. LUNGS:  Lung sounds were clear throughout. HEART:  Regular rate and rhythm. ABDOMEN:  Soft.  Bowel sounds present. EXTREMITIES/NEURO:  She had good range of motion of her upper extremities and motor strength of shoulders, elbows, and wrists.  Lower extremities, she had painful crepitus with full extension of her right knee.  She was able to flex it back to about 100 degrees today.  She had no instability.  She had full extension of her left knee.  She was able to flex it back about 110.  She had a well-healed midline surgical incision.  No instability.  Calf and thigh were soft and nontender bilaterally.  There were no signs of phlebitis.  She had good motion of both hips.  Peripheral vascular, she had good carotid pulses.  No bruits.  Good radial pulses.  Good ankle pulses without any lower extremity edema.  The patient was grossly neurologically intact.  Once again, very normal-appearing affect.  IMPRESSION: 1. End-stage osteoarthritis, right knee, with history of left total     knee arthroplasty. 2. Bipolar disorder. 3. Anxiety. 4. Hypercholesterolemia with hypertriglyceridemia. 5. History of gastritis. 6. History of stress incontinence. 7. History of breast cancer.  PLAN:  The patient has been evaluated and cleared for this upcoming surgery by her primary care physician.  The patient will undergo all routine labs and tests prior to having a right total knee arthroplasty by Dr. Thomasena Edis at [facility].  The patient had pros and cons and possible complication reviewed with her again.  She has been through this course in the past without any significant changes.  The patient would like to proceed.     Jamelle Rushing, P.A.   ______________________________ Erasmo Leventhal, M.D.    RWK/MEDQ  D:  02/03/2011  T:  02/03/2011  Job:  [id number]  cc:   Erasmo Leventhal,  M.D. Fax: [phone]  Electronically Signed by Arlyn Leak P.A. on 02/05/2011 07:11:20 AM Electronically Signed by Eugenia Mcalpine M.D. on 02/22/2011 03:38:38 PM

## 2011-03-31 LAB — URINALYSIS, ROUTINE W REFLEX MICROSCOPIC
Glucose, UA: NEGATIVE
Hgb urine dipstick: NEGATIVE
pH: 5

## 2011-03-31 LAB — DIFFERENTIAL
Basophils Relative: 0
Lymphs Abs: 0.7
Monocytes Absolute: 0.1
Monocytes Relative: 3
Neutro Abs: 4.6
Neutrophils Relative %: 84 — ABNORMAL HIGH

## 2011-03-31 LAB — BASIC METABOLIC PANEL
CO2: 32
Calcium: 9
Chloride: 98
Creatinine, Ser: 0.62
GFR calc Af Amer: >60
Sodium: 137

## 2011-03-31 LAB — CBC
Hemoglobin: 13.6
MCHC: 34
MCV: 90.3
RBC: 4.43
WBC: 5.5

## 2011-04-20 LAB — COMPREHENSIVE METABOLIC PANEL
ALT: 23
Albumin: 4
Alkaline Phosphatase: 102
BUN: 14
Chloride: 102
Glucose, Bld: 107 — ABNORMAL HIGH
Potassium: 3.7
Sodium: 137
Total Bilirubin: 0.4

## 2011-04-20 LAB — CBC
HCT: 39.4
Hemoglobin: 13.3
WBC: 5.9

## 2011-04-20 LAB — URINALYSIS, ROUTINE W REFLEX MICROSCOPIC
Bilirubin Urine: NEGATIVE
Nitrite: NEGATIVE
Protein, ur: NEGATIVE
Specific Gravity, Urine: 1.016
Urobilinogen, UA: 0.2

## 2011-04-20 LAB — DIFFERENTIAL
Basophils Absolute: 0
Basophils Relative: 0
Eosinophils Absolute: 0 — ABNORMAL LOW
Monocytes Absolute: 0.5
Neutro Abs: 2.9
Neutrophils Relative %: 50

## 2011-05-17 ENCOUNTER — Other Ambulatory Visit: Payer: Self-pay | Admitting: Oncology

## 2011-05-17 DIAGNOSIS — Z853 Personal history of malignant neoplasm of breast: Secondary | ICD-10-CM

## 2011-05-28 ENCOUNTER — Ambulatory Visit
Admission: RE | Admit: 2011-05-28 | Discharge: 2011-05-28 | Disposition: A | Discharge status: Home / Self Care | Payer: BC Managed Care – PPO | Source: Ambulatory Visit | Attending: Oncology | Admitting: Oncology

## 2011-05-28 DIAGNOSIS — Z853 Personal history of malignant neoplasm of breast: Secondary | ICD-10-CM

## 2011-07-22 ENCOUNTER — Other Ambulatory Visit: Payer: Self-pay

## 2011-07-22 ENCOUNTER — Other Ambulatory Visit: Payer: Self-pay | Admitting: Physician Assistant

## 2011-07-22 ENCOUNTER — Telehealth: Payer: Self-pay | Admitting: Oncology

## 2011-07-22 ENCOUNTER — Encounter: Payer: Self-pay | Admitting: Physician Assistant

## 2011-07-22 ENCOUNTER — Other Ambulatory Visit (HOSPITAL_BASED_OUTPATIENT_CLINIC_OR_DEPARTMENT_OTHER): Payer: BC Managed Care – PPO | Admitting: Lab

## 2011-07-22 ENCOUNTER — Ambulatory Visit (HOSPITAL_BASED_OUTPATIENT_CLINIC_OR_DEPARTMENT_OTHER): Payer: BC Managed Care – PPO | Admitting: Physician Assistant

## 2011-07-22 VITALS — BP 136/83 | HR 87 | Temp 97.9°F | Ht 64.0 in | Wt 273.1 lb

## 2011-07-22 DIAGNOSIS — C50912 Malignant neoplasm of unspecified site of left female breast: Secondary | ICD-10-CM

## 2011-07-22 DIAGNOSIS — Z853 Personal history of malignant neoplasm of breast: Secondary | ICD-10-CM

## 2011-07-22 DIAGNOSIS — R5381 Other malaise: Secondary | ICD-10-CM

## 2011-07-22 DIAGNOSIS — R5383 Other fatigue: Secondary | ICD-10-CM

## 2011-07-22 HISTORY — DX: Malignant neoplasm of unspecified site of left female breast: C50.912

## 2011-07-22 LAB — CBC WITH DIFFERENTIAL/PLATELET
BASO%: 0.2 % (ref 0.0–2.0)
EOS%: 0.6 % (ref 0.0–7.0)
Eosinophils Absolute: 0 10*3/uL (ref 0.0–0.5)
LYMPH%: 28.8 % (ref 14.0–49.7)
MCH: 30.8 pg (ref 25.1–34.0)
MCHC: 33.3 g/dL (ref 31.5–36.0)
MCV: 92.5 fL (ref 79.5–101.0)
MONO%: 8.3 % (ref 0.0–14.0)
NEUT#: 3.8 10*3/uL (ref 1.5–6.5)
Platelets: 299 10*3/uL (ref 145–400)
RBC: 4.34 10*6/uL (ref 3.70–5.45)
RDW: 13.7 % (ref 11.2–14.5)

## 2011-07-22 NOTE — Progress Notes (Signed)
Hematology and Oncology Follow Up Visit  [name] [id number] May 04, 1951 61 y.o. 07/22/2011    HPI:    A 61 year old [place] woman with a history of a T2 N0, ER positive left breast carcinoma S/P left lumpectomy with sentinel node dissection followed by four cycles of q.3-week Taxotere/Cytoxan, radiation, brief course of Arimidex, which patient discontinued and has adamantly refused further adjuvant hormonal therapy due to significant exacerbation of her history of underlying major depression and bipolar disorder.  Interim History:     [name] is seen today for routine followup pertain to her history of a T2, N0, ER positive left breast carcinoma. She is doing quite well. Since she was last seen in January of 2012, she has undergone a right knee replacement in August of 2012. She is quite pleased with these results, of note she has also undergone a left knee replacement in the remote past.   Her annual mammogram was performed in November 2012, results are reviewed below and all was well. She denies any unexplained fevers, chills, night sweats, or difficulty sleeping. She does note a bit of easy fatigability. 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 She denies any unexplained shortness of breath or chest pain. Appetite has been excellent without nausea, emesis, diarrhea or constipation issues. She denies any palpable lymphadenopathy. She's had no new medication changes the medication list has been reviewed and updated. A detailed review of systems is otherwise noncontributory as noted below.  Review of Systems: Constitutional:  no weight loss, fever, night sweats, feels well and fatigued Eyes: Denies any isues WUJ:[id number] any issues Cardiovascular: no chest pain or dyspnea on exertion Respiratory: no cough, shortness of breath, or wheezing Neurological: no TIA or stroke symptoms. Dermatological: negative Gastrointestinal: no abdominal  pain, change in bowel habits, or black or bloody stools Genito-Urinary: no dysuria, trouble voiding, or hematuria Hematological and Lymphatic: negative Breast: She denies any breast pain, palpable breast masses, or nipple discharge. Musculoskeletal: negative Remaining ROS negative.   Gynecologic History:   Gravida 3, para 3, menarche age 46, hysterectomy in 73.  She has a prior history of HRT use, discontinued at the time of her diagnosis of breast cancer in 2008.  Social History:   Patient has been married for 34 years.  Husband is in a group home with a history of psychotic episodes.  She has three adult children ages 32, daughter living who goes to [employer], son age 19 living in [place], daughter is [place] age 24, lives in [place] area teaches at [employer].  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  She is involved in preop screening.  Medications:   I have reviewed the patient's current medications.  Current Outpatient Prescriptions  Medication Sig Dispense Refill  . ALPRAZolam (XANAX XR) 1 MG 24 hr tablet Take 1 mg by mouth 2 (two) times daily.        [name] gemfibrozil (LOPID) 600 MG tablet Take 600 mg by mouth 2 (two) times daily before a meal.        . lamoTRIgine (LAMICTAL) 200 MG tablet Take 200 mg by mouth at bedtime.        . Multiple Vitamin (MULTIVITAMIN PO) Take 1 tablet by mouth daily.        . QUEtiapine (SEROQUEL) 200 MG tablet Take 200 mg by mouth at bedtime.        . Venlafaxine HCl 225 MG TB24 Take 1  tablet by mouth every morning.          Allergies:  Allergies  Allergen Reactions  . Imipramine Hives    Physical Exam: Filed Vitals:   07/22/11 1455  BP: 136/83  Pulse: 87  Temp: 97.9 F (36.6 C)   HEENT:  Sclerae anicteric, conjunctivae pink.  Oropharynx clear.  No mucositis or candidiasis.   Nodes:  No cervical, supraclavicular, or axillary lymphadenopathy palpated.  Breast Exam: The left breast was examined, and the lumpectomy scar  is benign without evidence of masses skin changes nipple inversion or discharge. The axilla is clear. The right breast is free of any masses skin changes nipple inversion or discharge without any axillary fullness or lymphadenopathy.   Lungs:  Clear to auscultation bilaterally.  No crackles, rhonchi, or wheezes.   Heart:  Regular rate and rhythm.   Abdomen:  Soft, nontender.  Positive bowel sounds.  No organomegaly or masses palpated.   Musculoskeletal:  No focal spinal tenderness to palpation.  Extremities:  Benign.  No peripheral edema or cyanosis.   Skin:  Benign.   Neuro:  Nonfocal, alert and oriented x 3.   Lab Results: Lab Results  Component Value Date   WBC 6.2 07/22/2011   HGB 13.3 07/22/2011   HCT 40.1 07/22/2011   MCV 92.5 07/22/2011   PLT 299 07/22/2011   NEUTROABS 3.8 07/22/2011     Chemistry      Component Value Date/Time   NA 137 02/14/2011 0450   K 4.3 02/14/2011 0450   CL 99 02/14/2011 0450   CO2 31 02/14/2011 0450   BUN 8 02/14/2011 0450   CREATININE 0.50 02/14/2011 0450      Component Value Date/Time   CALCIUM 9.7 02/14/2011 0450   ALKPHOS 113 02/05/2011 0925   AST 22 02/05/2011 0925   ALT 22 02/05/2011 0925   BILITOT 0.3 02/05/2011 0925        Radiological Studies:  05/28/11   DIGITAL DIAGNOSTIC BILATERAL MAMMOGRAM WITH CAD  Comparison: Multiple priors  Findings: The breast tissue is almost entirely fatty. The patient  has had a left lumpectomy. No mass or malignant type  calcifications are seen. Compared to priors.  Mammographic images were processed with CAD.  IMPRESSION:  No mammographic evidence of local recurrence, left breast. No  mammographic evidence of malignancy, right breast. Recommend  diagnostic mammography in 1 year.  BI-RADS CATEGORY 1: Negative.   Assessment:    A 61 year old [place] woman with a history of T2 N0, ER positive, PR HER-2 negative left breast carcinoma with Oncotype score of 9 diagnosed October 2008 S/P left lumpectomy  with sentinel node dissection followed by four cycles of q.3-week Taxotere/Cytoxan, radiation to the remaining left breast tissue.  The patient has undergone TAH-BSO in the past.  She had a brief course of Arimidex, but discontinued it due to the fact it triggered a major depressive episode.  She is on observation alone and adamantly refuses any further adjuvant therapy.  No evidence to suggest recurrent or metastatic disease.    Case has been reviewed with Dr. Donnie Coffin.  Plan:    [name] will continue to be followed on observation alone. We will plan on seeing her back in one year's time, with appropriate labs including serum chemistries, and LDH, CA 27-29, and vitamin D level. Of note, I have added a TSH to lab studies from today due to the patient complaining of fatigability.[facility].  Patient knows what be happy to see her prior to her year followup if the need should arise.  This plan was reviewed with the patient, who voices understanding and agreement.  She knows to call with any changes or problems.    Emerita Berkemeier T, PA-C 07/22/2011

## 2011-07-22 NOTE — Telephone Encounter (Signed)
Gv pt appt for jan2014 °

## 2011-07-23 LAB — TSH: TSH: 1.229 u[IU]/mL (ref 0.350–4.500)

## 2011-07-23 LAB — COMPREHENSIVE METABOLIC PANEL
ALT: 17 U/L (ref 0–35)
Alkaline Phosphatase: 109 U/L (ref 39–117)
Creatinine, Ser: 0.59 mg/dL (ref 0.50–1.10)
Sodium: 140 mEq/L (ref 135–145)
Total Bilirubin: 0.5 mg/dL (ref 0.3–1.2)
Total Protein: 7.3 g/dL (ref 6.0–8.3)

## 2011-07-23 LAB — LACTATE DEHYDROGENASE: LDH: 163 U/L (ref 94–250)

## 2012-06-12 ENCOUNTER — Other Ambulatory Visit: Payer: Self-pay | Admitting: Oncology

## 2012-06-12 DIAGNOSIS — Z853 Personal history of malignant neoplasm of breast: Secondary | ICD-10-CM

## 2012-07-06 ENCOUNTER — Telehealth: Payer: Self-pay | Admitting: *Deleted

## 2012-07-06 NOTE — Telephone Encounter (Signed)
left voice message to inform the patient of the cancelled appointment 

## 2012-07-19 ENCOUNTER — Ambulatory Visit
Admission: RE | Admit: 2012-07-19 | Discharge: 2012-07-19 | Disposition: A | Discharge status: Home / Self Care | Payer: BC Managed Care – PPO | Source: Ambulatory Visit | Attending: Oncology | Admitting: Oncology

## 2012-07-19 DIAGNOSIS — Z853 Personal history of malignant neoplasm of breast: Secondary | ICD-10-CM

## 2012-07-21 ENCOUNTER — Ambulatory Visit: Payer: BC Managed Care – PPO | Admitting: Oncology

## 2012-07-21 ENCOUNTER — Other Ambulatory Visit: Payer: BC Managed Care – PPO | Admitting: Lab

## 2012-07-25 ENCOUNTER — Telehealth: Payer: Self-pay | Admitting: Oncology

## 2012-07-25 NOTE — Telephone Encounter (Signed)
I left a message to schedule follow up.

## 2012-07-26 ENCOUNTER — Telehealth: Payer: Self-pay | Admitting: Oncology

## 2012-07-26 NOTE — Telephone Encounter (Signed)
Pt returned call to schedule follow up.   She asked if she could be discharged to her GYN since she is over five years out, and is no longer taking any medication.   She is current on her mammograms and is closely followed by her physician.  I offered to schedule one last appt to review her care and she declined.  She knows to call us with any questions or problems and undertands we will gladly transfer her care to another physician.

## 2012-10-06 ENCOUNTER — Telehealth: Payer: Self-pay | Admitting: Family Medicine

## 2012-10-06 NOTE — Telephone Encounter (Signed)
Nurse: 3:09 Patient: [name] CB: [phone]  MSG: Would a order for bloodwork

## 2013-01-22 ENCOUNTER — Other Ambulatory Visit: Payer: Self-pay | Admitting: Family Medicine

## 2013-03-26 ENCOUNTER — Telehealth: Payer: Self-pay | Admitting: Family Medicine

## 2013-03-26 MED ORDER — DOXYCYCLINE HYCLATE 100 MG PO TABS: 100.0000 mg | ORAL_TABLET | Freq: Two times a day (BID) | ORAL | Status: DC

## 2013-03-26 NOTE — Telephone Encounter (Signed)
Patient has a boil on her bottom. Can something be prescribed for this? Patient has an appointment at 4:00pm tomorrow 03/27/13 [facility]  °Please call Patient.  °Thanks  ° °

## 2013-03-26 NOTE — Telephone Encounter (Addendum)
Doxycycline 100mg  BID for 10 days per Dr Brett Canales. Rx sent electronically to [facility]. Patient notified.

## 2013-03-26 NOTE — Telephone Encounter (Signed)
Patient has a boil on her bottom. Can something be prescribed for this? Patient has an appointment at 4:00pm tomorrow 03/27/13 [facility]  Please call Patient.  Thanks

## 2013-03-27 ENCOUNTER — Ambulatory Visit (INDEPENDENT_AMBULATORY_CARE_PROVIDER_SITE_OTHER): Payer: BC Managed Care – PPO | Admitting: Family Medicine

## 2013-03-27 ENCOUNTER — Encounter: Payer: Self-pay | Admitting: Family Medicine

## 2013-03-27 VITALS — BP 162/94 | Ht 64.0 in | Wt 300.0 lb

## 2013-03-27 DIAGNOSIS — L0231 Cutaneous abscess of buttock: Secondary | ICD-10-CM

## 2013-03-27 NOTE — Progress Notes (Signed)
  Subjective:    Patient ID: [name], female    DOB: [birth year], 62 y.o.   MRN: [id number]  HPI Here today for boil on left buttock. She noticed it Saturday. Pt states it is tender, but no drainage.   This patient relates that she noticed an area on her left flank area that was red tender swollen. She notices over the past several days past medical history benign family history benign Review of Systems     Objective:   Physical Exam She does have an abscess is noted. She was told the best way to take care this was incision and drainage. The procedure was explained patient consented this area was numb with 1% lidocaine with epinephrine. #11 blade was used under sterile technique. A small amount of pus was obtained packing was placed patient already on doxycycline from phone call yesterday. Plus also patient will followup if worse.       Assessment & Plan:  Abscess-incision and drainage without difficulty packing was placed culture done patient already on doxycycline

## 2013-03-31 LAB — CULTURE, ROUTINE-ABSCESS
Gram Stain: NONE SEEN
Gram Stain: NONE SEEN
Gram Stain: NONE SEEN

## 2013-04-06 ENCOUNTER — Telehealth: Payer: Self-pay | Admitting: Family Medicine

## 2013-04-06 MED ORDER — DOXYCYCLINE HYCLATE 100 MG PO TABS: 100.0000 mg | ORAL_TABLET | Freq: Two times a day (BID) | ORAL | Status: DC

## 2013-04-06 NOTE — Telephone Encounter (Signed)
Rx sent electronically to [facility]. Patient notified.

## 2013-04-06 NOTE — Telephone Encounter (Signed)
Ref doxy 100 bid ten d

## 2013-04-06 NOTE — Telephone Encounter (Signed)
Site that was lanced by Dr. Lorin Picket on 03/27/2013 is still red and tender.  Wants to know if Dr can call in an antibiotic or should she be seen next week for a follow up?  Please call Patient. Thanks

## 2013-04-20 ENCOUNTER — Encounter (HOSPITAL_COMMUNITY): Payer: Self-pay

## 2013-04-20 ENCOUNTER — Other Ambulatory Visit (HOSPITAL_COMMUNITY): Payer: BC Managed Care – PPO | Attending: Psychiatry | Admitting: Psychiatry

## 2013-04-20 DIAGNOSIS — F32A Depression, unspecified: Secondary | ICD-10-CM | POA: Insufficient documentation

## 2013-04-20 DIAGNOSIS — F39 Unspecified mood [affective] disorder: Secondary | ICD-10-CM | POA: Insufficient documentation

## 2013-04-20 DIAGNOSIS — F329 Major depressive disorder, single episode, unspecified: Secondary | ICD-10-CM | POA: Insufficient documentation

## 2013-04-20 DIAGNOSIS — F3130 Bipolar disorder, current episode depressed, mild or moderate severity, unspecified: Secondary | ICD-10-CM

## 2013-04-20 DIAGNOSIS — F4310 Post-traumatic stress disorder, unspecified: Secondary | ICD-10-CM | POA: Insufficient documentation
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
## 2013-04-20 NOTE — Progress Notes (Signed)
    Daily Group Progress Note  Program: IOP  Group Time: 9:00-10:30 am   Participation Level: Minimal  Behavioral Response: Appropriate  Type of Therapy:  Process Group  Summary of Progress: Today was Pts first day in the group. She spend this time completing the initial assessment and orientation. Pt was introduced and observed the group process.      Group Time: 10:30 am - 12:00 pm   Participation Level:  Active  Behavioral Response: Appropriate  Type of Therapy: Psycho-education Group  Summary of Progress: Pt learned the skill of assertiveness and how to avoid using passive and aggressive communication to best have needs met.  Carman Ching, LCSW

## 2013-04-21 ENCOUNTER — Other Ambulatory Visit: Payer: Self-pay | Admitting: Family Medicine

## 2013-04-23 ENCOUNTER — Encounter (HOSPITAL_COMMUNITY): Payer: BC Managed Care – PPO | Attending: Psychiatry | Admitting: Psychiatry

## 2013-04-23 DIAGNOSIS — F39 Unspecified mood [affective] disorder: Secondary | ICD-10-CM | POA: Insufficient documentation

## 2013-04-23 DIAGNOSIS — F3130 Bipolar disorder, current episode depressed, mild or moderate severity, unspecified: Secondary | ICD-10-CM

## 2013-04-23 DIAGNOSIS — F4310 Post-traumatic stress disorder, unspecified: Secondary | ICD-10-CM | POA: Insufficient documentation

## 2013-04-24 ENCOUNTER — Encounter (HOSPITAL_COMMUNITY): Payer: BC Managed Care – PPO | Admitting: Psychiatry

## 2013-04-24 ENCOUNTER — Encounter (HOSPITAL_COMMUNITY): Payer: Self-pay

## 2013-04-24 DIAGNOSIS — F329 Major depressive disorder, single episode, unspecified: Secondary | ICD-10-CM

## 2013-04-25 ENCOUNTER — Encounter (HOSPITAL_COMMUNITY): Payer: Self-pay | Admitting: Psychiatry

## 2013-04-25 ENCOUNTER — Encounter (HOSPITAL_COMMUNITY): Payer: Self-pay

## 2013-04-25 ENCOUNTER — Encounter (HOSPITAL_COMMUNITY): Payer: BC Managed Care – PPO | Admitting: Psychiatry

## 2013-04-25 DIAGNOSIS — F329 Major depressive disorder, single episode, unspecified: Secondary | ICD-10-CM

## 2013-04-25 NOTE — Progress Notes (Signed)
    Daily Group Progress Note  Program: IOP  Group Time: 9-10:30 am  Participation Level: Active  Behavioral Response: Appropriate and Sharing  Type of Therapy:  Process Group  Summary of Progress: The patient checked in with 'thumb down'. She admitted she was a little anxious today. She had gone to church over the weekend. The patient noted she had disclosed her depression with an acquaintance and he had shared with her that he had been depressed in the past. The patient admitted she had felt validated by him as opposed to feeling as if her depression were negated.   Group Time: 10:45- 12pm  Participation Level:  Active  Behavioral Response: Appropriate and Sharing  Type of Therapy: Psycho-education Group  Summary of Progress: With the Chaplain, the patient shared about her husband's schizophrenia and how she eventually had to place him in a group home. He remains there to this day. The patient was able to express her grief over the loss of the marriage, his absence as a companion, and her best friend. She admitted she had gotten married with the intention of living with him, but that had come to an end with his mental illness. She made some excellent comments and displayed a depth of pain and grief that should be addressed further going forward.   Carman Ching, LCSW

## 2013-04-25 NOTE — Progress Notes (Signed)
    Daily Group Progress Note  Program: IOP  Group Time: 9:00-10:30 am   Participation Level: Active  Behavioral Response: Appropriate  Type of Therapy:  Process Group  Summary of Progress: Pt learned about how to use heartmath to reduce depression and anxiety symptoms.       Group Time: 9:00-10:30 am   Participation Level:  Active  Behavioral Response: Appropriate  Type of Therapy: Psycho-education Group  Summary of Progress: Pt practiced using the heartmath program to manage stress symptoms.    Carman Ching, LCSW
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
## 2013-04-25 NOTE — Progress Notes (Signed)
    Daily Group Progress Note  Program: IOP  Group Time: 9:00-10:30 am   Participation Level: Active  Behavioral Response: Appropriate  Type of Therapy:  Process Group  Summary of Progress: Pt reports moderate depression symptoms and said she feels "stuck" in her job that is overwhelming her. 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      Group Time: 10:30 am - 12:00 pm   Participation Level:  Active  Behavioral Response: Appropriate  Type of Therapy: Psycho-education Group  Summary of Progress: Pt learned the stress management skill of heartmath to reduce feelings of depression and anxiety.  Carman Ching, LCSW

## 2013-04-26 ENCOUNTER — Encounter (HOSPITAL_COMMUNITY): Payer: BC Managed Care – PPO | Admitting: Psychiatry

## 2013-04-26 DIAGNOSIS — F329 Major depressive disorder, single episode, unspecified: Secondary | ICD-10-CM

## 2013-04-26 NOTE — Progress Notes (Signed)
    Daily Group Progress Note  Program: IOP  Group Time: 9:00-10:30 am   Participation Level: Active  Behavioral Response: Appropriate  Type of Therapy:  Process Group  Summary of Progress: Pt reports "ok" mood. Pt was quiet and observed more than engaged. Pts primary stressor continues to be her job and feeling like she needs a change from work, but feeling "trapped" into staying where she is. Pt is uncertain about what she can do.      Group Time: 10:30 am - 12:00 pm   Participation Level:  Active  Behavioral Response: Appropriate  Type of Therapy: Psycho-education Group  Summary of Progress: Pt participated in an education segment on learning about the support groups offered through the [facility].   Carman Ching, LCSW

## 2013-04-27 ENCOUNTER — Encounter (HOSPITAL_COMMUNITY): Payer: BC Managed Care – PPO | Admitting: Psychiatry

## 2013-04-27 DIAGNOSIS — F329 Major depressive disorder, single episode, unspecified: Secondary | ICD-10-CM

## 2013-04-27 NOTE — Progress Notes (Signed)
    Daily Group Progress Note  Program: IOP  Group Time: 9:00-10:30 am   Participation Level: Active  Behavioral Response: Appropriate  Type of Therapy:  Process Group  Summary of Progress: Pt appeared tired in the group and difficulty staying alert. Pt said her medications are making it difficult to be awake. Pt met with the doctor today to explore medications. Pt continues to be stressed about her job and torn between going back to work or going on disability. Pt is unsure of how to handle her decision.      Group Time: 10:30 am - 12:00 pm   Participation Level:  Active  Behavioral Response: Appropriate  Type of Therapy: Psycho-education Group  Summary of Progress: Pt learned about the symptoms of depression and how to recognize to intervene with reducing depression symptoms.   Carman Ching, LCSW

## 2013-04-30 ENCOUNTER — Encounter (HOSPITAL_COMMUNITY): Payer: BC Managed Care – PPO | Admitting: Psychiatry

## 2013-04-30 DIAGNOSIS — F329 Major depressive disorder, single episode, unspecified: Secondary | ICD-10-CM

## 2013-04-30 NOTE — Progress Notes (Signed)
Patient ID: [name], female   DOB: June 23, 1951, 62 y.o.   MRN: [id number] Patient seen today 04-30-13. States that she has done the med changes as had been suggested and is now taking Xanax XR 1 mg a.m. and at at bedtime and regular Xanax 1 mg at known and 4 PM. Patient continues to struggle with this and complains that she has anxiety, discussed CBT and heart math, and encouraged her to try that patient stated understanding. Needs a lot of reassurance. Discussed that we would like to communicate with Dr. Evelene Croon and patient gave her consent. Will update Dr. core regarding the patient's use of Xanax on an as-needed basis and will recommend that the Xanax be scheduled.

## 2013-04-30 NOTE — Progress Notes (Addendum)
Patient ID: [name], female   DOB: 1950-12-14, 62 y.o.   MRN: [id number]     And this note is from 10:15-14. 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 She sees [name], on an outpatient basis. Patient states that time she has been taking up to 6 Xanax as a day and is on a combination of XR and regular Xanax. States this is affecting her memory and I discussed the side effects of Xanax on the memory. Patient reports that she has multiple stressors which include financial her job and the fact that her husband is in the group home and continues to be psychotic. Patient is anxious but denies suicidal or homicidal ideation and has no hallucinations or delusions. Discussed adjusting her medications and talk to her about taking Xanax XR 1 mg a.m. and at bedtime and regular Xanax 1 mg at known and 4 PM and to increase her Seroquel to 350 mg at bedtime and continue Effexor and Lamictal and Lopid at the present doses patient stated understanding.

## 2013-05-01 ENCOUNTER — Encounter (HOSPITAL_COMMUNITY): Payer: BC Managed Care – PPO

## 2013-05-01 ENCOUNTER — Telehealth (HOSPITAL_COMMUNITY): Payer: Self-pay | Admitting: Psychiatry
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
## 2013-05-02 ENCOUNTER — Encounter (HOSPITAL_COMMUNITY): Payer: BC Managed Care – PPO | Admitting: Psychiatry

## 2013-05-02 DIAGNOSIS — F329 Major depressive disorder, single episode, unspecified: Secondary | ICD-10-CM

## 2013-05-03 ENCOUNTER — Encounter (HOSPITAL_COMMUNITY): Payer: BC Managed Care – PPO | Admitting: Psychiatry

## 2013-05-03 DIAGNOSIS — F329 Major depressive disorder, single episode, unspecified: Secondary | ICD-10-CM

## 2013-05-03 NOTE — Progress Notes (Signed)
    Daily Group Progress Note  Program: IOP  Group Time: 9:00 am -12:00 pm  Participation Level: Active  Behavioral Response: Appropriate  Type of Therapy:  Process Group  Summary of Progress: member participated in two goodbye ceremonies for members ending the group and had the opportunity for healthy closure. Pt identified healthy forms of grieving losses.      Floriene Jeschke E, LCSW 

## 2013-05-03 NOTE — Progress Notes (Signed)
    Daily Group Progress Note  Program: IOP  Group Time: 9:00-10:30 am   Participation Level: Active  Behavioral Response: Appropriate  Type of Therapy:  Process Group  Summary of Progress: Pt processed anger at her husbands mental illness and became very emotional when talking about past situations involving him. Pt was able to collect herself immediatly following her expression of emotion.      Group Time: 10:30 am - 12:00 pm   Participation Level:  Active  Behavioral Response: Appropriate  Type of Therapy: Psycho-education Group  Summary of Progress: Pt learned about low self-esteem and how it begins and effects mood and behaviors today.   Carman Ching, LCSW

## 2013-05-03 NOTE — Progress Notes (Signed)
Patient ID: [name], female   DOB: August 21, 1950, 62 y.o.   MRN: [id number] A:  Placed call to Dr. Evelene Croon [phone]) and left message with secretary,  that pt has been abusing her Xanax and that Dr. Rutherford Limerick is recommending the medicine to be scheduled.  Will inform Dr. Rutherford Limerick that phone call has been made.

## 2013-05-04 ENCOUNTER — Encounter (HOSPITAL_COMMUNITY): Payer: BC Managed Care – PPO | Admitting: Psychiatry

## 2013-05-04 DIAGNOSIS — F329 Major depressive disorder, single episode, unspecified: Secondary | ICD-10-CM

## 2013-05-07 ENCOUNTER — Encounter (HOSPITAL_COMMUNITY): Payer: BC Managed Care – PPO | Admitting: Psychiatry

## 2013-05-07 DIAGNOSIS — F329 Major depressive disorder, single episode, unspecified: Secondary | ICD-10-CM

## 2013-05-07 MED ORDER — QUETIAPINE FUMARATE 200 MG PO TABS: 400.0000 mg | ORAL_TABLET | Freq: Every day | ORAL | Status: DC

## 2013-05-07 NOTE — Progress Notes (Signed)
    Daily Group Progress Note  Program: IOP  Group Time: 9:00-10:30 am   Participation Level: Active  Behavioral Response: Appropriate  Type of Therapy:  Process Group  Summary of Progress: Pt reports improved mood and had increased affect and was smiling and joking with group members before group. Pt is processing past losses and current stressors with her job and trying to make a plan on how many hours to return to work.      Group Time: 10:30 am - 12:00 pm   Participation Level:  Active  Behavioral Response: Appropriate  Type of Therapy: Psycho-education Group  Summary of Progress: Pt participated in a group with a focus on grief and loss and identified losses impacting overall wellness and effective grieving strategies.   Carman Ching, LCSW

## 2013-05-07 NOTE — Progress Notes (Signed)
    Daily Group Progress Note  Program: IOP  Group Time: 9:00-10:30 am   Participation Level: Active  Behavioral Response: Appropriate  Type of Therapy:  Process Group  Summary of Progress: Pt reports stable mood and is still trying to determine if she will return to work part time or begin the process for long term disability. Pt is receiving support and encouragement from the group that is helping her reduce feelings of depression.      Group Time: 10:30 am - 12:00 pm   Participation Level:  Active  Behavioral Response: Appropriate  Type of Therapy: Psycho-education Group  Summary of Progress: Group ended an hour early due to Clinical research associate having a family emergency.   Carman Ching, LCSW

## 2013-05-07 NOTE — Progress Notes (Signed)
Patient ID: [name], female   DOB: 1950/12/04, 62 y.o.   MRN: [id number] Patient reviewed and interviewed today, states her anxiety has decreased since increasing the Seroquel to 50 mg every day. Patient is continuing her Xanax XR twice a day and regular Xanax twice a day. States this combination seems to be helping her sleep and appetite are good. Patient is mildly anxious about returning to work as she has burned some bridges fair. Denies suicidal or homicidal ideation no hallucinations or delusions. Discussed increasing Seroquel to 400 mg at bedtime and she stated understanding.

## 2013-05-08 ENCOUNTER — Encounter (HOSPITAL_COMMUNITY): Payer: BC Managed Care – PPO | Admitting: Psychiatry

## 2013-05-08 DIAGNOSIS — F329 Major depressive disorder, single episode, unspecified: Secondary | ICD-10-CM

## 2013-05-09 ENCOUNTER — Encounter (HOSPITAL_COMMUNITY): Payer: BC Managed Care – PPO

## 2013-05-09 NOTE — Progress Notes (Signed)
    Daily Group Progress Note  Program: IOP  Group Time: 9:00-10:30 am   Participation Level: Active  Behavioral Response: Appropriate  Type of Therapy:  Process Group  Summary of Progress:  Pt participated in discussion what makes group effective and processed barriers to maximizing progress in a group setting. Pt spoke about ways they could "sabotage their wellness" by not committing fully to the process and not being fully honest with what they struggles are.       Group Time: 10:30 am - 12:00 pm   Participation Level:  Active  Behavioral Response: Appropriate  Type of Therapy: Psycho-education Group  Summary of Progress:Pt learned about feelings, the causes and identified a current feeling getting in the way of them feeling fulfilled and identified what message that feeling is trying to send them regarding changes that need to be made.  Francesco Provencal E, LCSW 

## 2013-05-10 ENCOUNTER — Encounter (HOSPITAL_COMMUNITY): Payer: BC Managed Care – PPO | Admitting: Psychiatry

## 2013-05-10 DIAGNOSIS — F329 Major depressive disorder, single episode, unspecified: Secondary | ICD-10-CM

## 2013-05-10 NOTE — Progress Notes (Signed)
    Daily Group Progress Note  Program: IOP  Group Time: 9:00-10:30 am   Participation Level: Active  Behavioral Response: Appropriate  Type of Therapy:  Process Group  Summary of Progress: Pt reports improved mood and was not tearful. Pt said she still has unhappiness associated with having mental illness and her husbands mental illness that has negatively impacted their marriage and them living together. Pt processed these losses of life expectations and Pt said it helps her to hear that others are unhappy with things in their life as well and not feel alone.      Group Time: 10:30 am - 12:00 pm   Participation Level:  Active  Behavioral Response: Appropriate  Type of Therapy: Psycho-education Group  Summary of Progress: Pt participated in a goodbye ceremony for two members ending the group today and practiced having healthy closure and grieving losses.   Carman Ching, LCSW

## 2013-05-11 ENCOUNTER — Encounter (HOSPITAL_COMMUNITY): Payer: BC Managed Care – PPO | Admitting: Psychiatry

## 2013-05-11 DIAGNOSIS — F329 Major depressive disorder, single episode, unspecified: Secondary | ICD-10-CM

## 2013-05-11 NOTE — Progress Notes (Signed)
    Daily Group Progress Note  Program: IOP  Group Time: 9:00-10:30 am   Participation Level: Active  Behavioral Response: Appropriate  Type of Therapy:  Process Group  Summary of Progress: Pt appeared optimistic and was dressed up for Halloween. She was smiling and showed improved affect. Pt said she is looking forward to a weekend by herself due to not having to spend with her husband.      Group Time: 10:30 am - 12:00 pm   Participation Level:  Active  Behavioral Response: Appropriate  Type of Therapy: Psycho-education Group  Summary of Progress: Pt participated in a goodbye ceremony and was introduced and practiced the skill of distraction to shift away from worrisome thinking and create positive feelings to replace the worry.  Carman Ching, LCSW

## 2013-05-14 ENCOUNTER — Other Ambulatory Visit (HOSPITAL_COMMUNITY): Payer: BC Managed Care – PPO | Attending: Psychiatry | Admitting: Psychiatry

## 2013-05-14 DIAGNOSIS — F3289 Other specified depressive episodes: Secondary | ICD-10-CM | POA: Insufficient documentation

## 2013-05-14 DIAGNOSIS — F329 Major depressive disorder, single episode, unspecified: Secondary | ICD-10-CM | POA: Insufficient documentation

## 2013-05-14 NOTE — Progress Notes (Signed)
    Daily Group Progress Note  Program: IOP  Group Time: 9:00-10:30 am   Participation Level: Active  Behavioral Response: Appropriate  Type of Therapy:  Process Group  Summary of Progress: Pt reports feeling "good" today and has increased affect. Pt has been processing the strain in her marriage due to her husbands mental health conditions that require him to live in a group home as well as work stress. Pt has yet to make a decision about returning to work.      Group Time: 10:30 am - 12:00 pm   Participation Level:  Active  Behavioral Response: Appropriate  Type of Therapy: Psycho-education Group  Summary of Progress: Pt participated in a group on grief and loss and identified current losses impacting overall wellness and effective grieving strategies.  Carman Ching, LCSW

## 2013-05-14 NOTE — Progress Notes (Signed)
Patient ID: [name], female   DOB: [birth year], 62 y.o.   MRN: [id number] Pt seen stats her outpt Psychiatrist Dr Evelene Croon has change her meds to Sroquel 400 mg q hs, Lamictal 350 mg q y.Xanax- XR1 mg BID and Regular xanax 1 mg BID. tol meds well moods better. No si/hi. No hallu/delusions.

## 2013-05-15 ENCOUNTER — Encounter (HOSPITAL_COMMUNITY): Payer: BC Managed Care – PPO | Attending: Psychiatry | Admitting: Psychiatry

## 2013-05-15 DIAGNOSIS — F329 Major depressive disorder, single episode, unspecified: Secondary | ICD-10-CM

## 2013-05-15 DIAGNOSIS — F3130 Bipolar disorder, current episode depressed, mild or moderate severity, unspecified: Secondary | ICD-10-CM | POA: Insufficient documentation

## 2013-05-15 DIAGNOSIS — F411 Generalized anxiety disorder: Secondary | ICD-10-CM | POA: Insufficient documentation

## 2013-05-16 ENCOUNTER — Encounter (HOSPITAL_COMMUNITY): Payer: BC Managed Care – PPO | Admitting: Psychiatry

## 2013-05-16 DIAGNOSIS — F329 Major depressive disorder, single episode, unspecified: Secondary | ICD-10-CM

## 2013-05-16 NOTE — Progress Notes (Signed)
    Daily Group Progress Note  Program: IOP  Group Time: 9:00-10:30 am   Participation Level: Active  Behavioral Response: Appropriate  Type of Therapy:  Process Group  Summary of Progress: Pt reports "struggling" today and is still confused and uncertain about what to do about her job. Pt is aware of the high stress it causes her, but is concerned about her financial situation if she does not return.      Group Time: 10:30 am - 12:00 pm   Participation Level:  Active  Behavioral Response: Appropriate  Type of Therapy: Psycho-education Group  Summary of Progress: Pt learned about how to use aromatherapy to manage depression, anxiety and improve sleep.  Carman Ching, LCSW

## 2013-05-17 ENCOUNTER — Encounter (HOSPITAL_COMMUNITY): Payer: BC Managed Care – PPO | Admitting: Psychiatry

## 2013-05-17 DIAGNOSIS — F329 Major depressive disorder, single episode, unspecified: Secondary | ICD-10-CM

## 2013-05-17 NOTE — Progress Notes (Signed)
Patient ID: [name], female   DOB: 05-18-1951, 61 y.o.   MRN: [id number] D: This is a 62 yr old, married, caucasian, female, who was referred per Dr. Evelene Croon, treatment for worsening anxiety, sadness, and agitation. Denies any SI, HI, or A/V hallucinations. No prior suicide attempts nor gestures. Has had several past admissions at [facility] (inpt and IOP). Pt is well known to this Clinical research associate, due to previous admits. According to pt, she was previously off from work three weeks in July 2014 due to above symptoms worsening. "I got stable and returned to work. Now I feel that way again. I am so tired of this." Pt stated Dr. Evelene Croon recently diagnosed her with Bipolar Disorder and made some recent medication changes (decreased Effexor and increased Lamictal). Pt has been seeing Dr. Evelene Croon and Arbutus Ped, LCSW for ten years. 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 Reports since she is very irritable, she hasn't been herself at work. C/O patients being very rude. "I'm worried that since I've turned 62 that my mother just sat in a chair and did nothing. 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 2) Husband's illness: Six yrs ago husband, of thirty five yrs marriage, had a psychotic break and had plans to kill patient. He currently resides in a group home and is stable. 3) Limited support system.  Pt completed [facility]-IOP today.  Reports overall mood improved, sleep and appetite.  Continues to struggle with anxiety.  Pt is very anxious about returning to work on 05-28-13.  A:  D/C today.  Will follow up with Dr. Evelene Croon on 05-24-13 and pt will schedule an appt with Arbutus Ped, LCSW.  Encouraged support groups.  R:  Pt receptive.

## 2013-05-17 NOTE — Patient Instructions (Signed)
Patient completed [facility]-IOP today.  Will follow up with Dr. Evelene Croon on 05-24-13 @ 12:45 pm and patient will call Arbutus Ped, LCSW for an appointment.  Return to work on 05-28-13, without any restrictions.  Encourage support groups.

## 2013-05-17 NOTE — Progress Notes (Signed)
Discharge Note  Patient:  [name] is an 62 y.o., female DOB:  14-Apr-1951  Date of Admission: 04/20/13   Date of Discharge: 05/17/13  Reason for Admission: Depression and anxiety  Hospital Course: Patient was referred by Dr. Evelene Croon, she started IOP. It was found that she had been using more Xanax as and so it was recommended that she decrease her Xanax XR to 1 mg twice a day and take the regular Xanax in the morning and at 4 PM. Dr. Evelene Croon increased her Seroquel 400 mg at at bedtime and Lamictal 300 mg at at bedtime Effexor 187.5 mg q. a.m. Patient gradually stabilized had a lot of difficulty reducing the dose of the Xanax both was able to do so she gradually began to open up and talk about her problems in group. Patient was angry and felt burned out by her job but as she got better she decided to return to her job. Overall she was coping well and tolerating her medications well.  Mental Status at Discharge: Patient was alert, oriented x3, affect was bright mood was euthymic speech was normal with no suicidal or homicidal ideation and no hallucinations or delusions. Recent and remote memory was good, judgment and insight was good, concentration and recall was good.  Lab Results: No results found for this or any previous visit (from the past 48 hour(s)).  Current outpatient prescriptions:ALPRAZolam (XANAX XR) 1 MG 24 hr tablet, Take 1 mg by mouth 2 (two) times daily.  , Disp: , Rfl: ;  ALPRAZolam (XANAX) 1 MG tablet, Take 1 mg by mouth at bedtime as needed for sleep. Take one tab at noon and at 4pm., Disp: , Rfl: ;  gemfibrozil (LOPID) 600 MG tablet, TAKE 1 TABLET TWICE DAILY, Disp: 180 tablet, Rfl: 0;  lamoTRIgine (LAMICTAL) 200 MG tablet, Take by mouth at bedtime. Take 300 mg at hs, Disp: , Rfl:  Multiple Vitamin (MULTIVITAMIN PO), Take 1 tablet by mouth daily.  , Disp: , Rfl: ;  QUEtiapine (SEROQUEL) 200 MG tablet, Take 2 tablets (400 mg total) by mouth at bedtime. Take 300 mg at hs, Disp: 30  tablet, Rfl: 0;  Venlafaxine HCl 225 MG TB24, Take by mouth every morning. Take 187.5 mg in the morning., Disp: , Rfl:   Axis Diagnosis:   Axis I: Anxiety Disorder NOS and Bipolar, Depressed Axis II: Cluster B Traits Axis III:  Past Medical History  Diagnosis Date  . Bipolar 1 disorder   . Anxiety   . Cancer   . High cholesterol   . Breast cancer, left breast 07/22/2011  . Depression   . OA (osteoarthritis)   . Obesity    Axis IV: occupational problems, other psychosocial or environmental problems, problems related to social environment and problems with primary support group Axis V: 61-70 mild symptoms   Level of Care:  OP  Discharge destination:  Home  Is patient on multiple antipsychotic therapies at discharge:  No    Has Patient had three or more failed trials of antipsychotic monotherapy by history:  No  Patient phone:  [phone] (home)  Patient address:   [address] [place] [address],   Follow-up recommendations:  Activity:  As tolerated Diet:  Regular Other:  Followup with Dr. Evelene Croon for medications and Howard Pouch for therapy  Comments:  Dr. Evelene Croon was updated regarding the medication changes  The patient received suicide prevention pamphlet:  Yes   Margit Banda 05/17/2013, 12:23 PM

## 2013-05-17 NOTE — Progress Notes (Signed)
    Daily Group Progress Note  Program: IOP  Group Time: 9:00-10:30 am   Participation Level: Active  Behavioral Response: Appropriate  Type of Therapy:  Process Group  Summary of Progress: Pt described the symptoms of depression being experienced and discussed triggers that increase the symptoms.       Group Time: 10:30 am - 12:00 pm   Participation Level:  Active  Behavioral Response: Appropriate  Type of Therapy: Psycho-education Group  Summary of Progress: Pt learned about depression as a clinical medical condition and watched a video about celebrities talking openly about their depression that started a dialogue about stigma associated with depression.  Carman Ching, LCSW

## 2013-05-18 ENCOUNTER — Ambulatory Visit (HOSPITAL_COMMUNITY): Payer: Self-pay

## 2013-05-18 NOTE — Progress Notes (Signed)
    Daily Group Progress Note  Program: IOP  Group Time: 9:00-10:30 am   Participation Level: Active  Behavioral Response: Appropriate  Type of Therapy:  Process Group  Summary of Progress: Today was Pts last day in the group. Pt said she feels "significantly better" and denied active feelings of depression. Pt said she plans to return to work and said the group helped her get clearer thinking to make this decision. Her thoughts are organized and goal directed and she said she is able to use her wellness skills that she could not access when she started the group.      Group Time: 10:30 am - 12:00 pm   Participation Level:  Active  Behavioral Response: Appropriate  Type of Therapy: Psycho-education Group  Summary of Progress: Pt said goodbye to two members ending group today and practiced the skill of healthy closure.  Carman Ching, LCSW

## 2013-05-21 ENCOUNTER — Ambulatory Visit (HOSPITAL_COMMUNITY): Payer: Self-pay

## 2013-05-22 ENCOUNTER — Ambulatory Visit (HOSPITAL_COMMUNITY): Payer: Self-pay

## 2013-05-23 ENCOUNTER — Ambulatory Visit (HOSPITAL_COMMUNITY): Payer: Self-pay

## 2013-07-18 ENCOUNTER — Other Ambulatory Visit (INDEPENDENT_AMBULATORY_CARE_PROVIDER_SITE_OTHER): Payer: Self-pay | Admitting: *Deleted

## 2013-07-18 ENCOUNTER — Telehealth (INDEPENDENT_AMBULATORY_CARE_PROVIDER_SITE_OTHER): Payer: Self-pay | Admitting: *Deleted

## 2013-07-18 DIAGNOSIS — Z800 Family history of malignant neoplasm of digestive organs: Secondary | ICD-10-CM

## 2013-07-18 DIAGNOSIS — Z1211 Encounter for screening for malignant neoplasm of colon: Secondary | ICD-10-CM

## 2013-07-18 NOTE — Telephone Encounter (Signed)
Patient needs movi prep 

## 2013-07-19 MED ORDER — PEG-KCL-NACL-NASULF-NA ASC-C 100 G PO SOLR: 1.0000 | Freq: Once | ORAL | Status: DC

## 2013-07-25 ENCOUNTER — Other Ambulatory Visit: Payer: Self-pay | Admitting: Family Medicine

## 2013-07-25 DIAGNOSIS — Z853 Personal history of malignant neoplasm of breast: Secondary | ICD-10-CM

## 2013-07-25 DIAGNOSIS — Z9889 Other specified postprocedural states: Secondary | ICD-10-CM

## 2013-07-30 ENCOUNTER — Encounter: Payer: Self-pay | Admitting: *Deleted

## 2013-08-01 ENCOUNTER — Encounter: Payer: Self-pay | Admitting: Nurse Practitioner

## 2013-08-01 ENCOUNTER — Ambulatory Visit (INDEPENDENT_AMBULATORY_CARE_PROVIDER_SITE_OTHER): Payer: BC Managed Care – PPO | Admitting: Nurse Practitioner

## 2013-08-01 VITALS — BP 134/90 | Ht 64.0 in | Wt 294.0 lb

## 2013-08-01 DIAGNOSIS — Z01419 Encounter for gynecological examination (general) (routine) without abnormal findings: Secondary | ICD-10-CM

## 2013-08-01 DIAGNOSIS — Z23 Encounter for immunization: Secondary | ICD-10-CM

## 2013-08-01 DIAGNOSIS — Z13820 Encounter for screening for osteoporosis: Secondary | ICD-10-CM

## 2013-08-01 DIAGNOSIS — Z780 Asymptomatic menopausal state: Secondary | ICD-10-CM

## 2013-08-01 DIAGNOSIS — Z0000 Encounter for general adult medical examination without abnormal findings: Secondary | ICD-10-CM

## 2013-08-01 DIAGNOSIS — E781 Pure hyperglyceridemia: Secondary | ICD-10-CM

## 2013-08-03 ENCOUNTER — Ambulatory Visit
Admission: RE | Admit: 2013-08-03 | Discharge: 2013-08-03 | Disposition: A | Discharge status: Home / Self Care | Payer: BC Managed Care – PPO | Source: Ambulatory Visit | Attending: Family Medicine | Admitting: Family Medicine

## 2013-08-03 ENCOUNTER — Other Ambulatory Visit: Payer: Self-pay | Admitting: Family Medicine

## 2013-08-03 DIAGNOSIS — Z853 Personal history of malignant neoplasm of breast: Secondary | ICD-10-CM

## 2013-08-03 DIAGNOSIS — Z9889 Other specified postprocedural states: Secondary | ICD-10-CM

## 2013-08-06 ENCOUNTER — Encounter: Payer: Self-pay | Admitting: Nurse Practitioner

## 2013-08-06 DIAGNOSIS — E781 Pure hyperglyceridemia: Secondary | ICD-10-CM | POA: Insufficient documentation

## 2013-08-06 NOTE — Progress Notes (Signed)
   Subjective:    Patient ID: [name], female    DOB: Oct 12, 1950, 63 y.o.   MRN: [id number]  HPI Presents for wellness check up. Defers pelvic and rectal exams. Has appt with Dr. Laural Golden for colonoscopy. Has been having major mental health problems lately. Continues follow up with psychiatrist for bipolar disorder.     Review of Systems  Constitutional: Negative for activity change, appetite change and fatigue.  HENT: Negative for dental problem, ear pain, sinus pressure and sore throat.   Respiratory: Negative for cough, chest tightness, shortness of breath and wheezing.   Cardiovascular: Negative for chest pain and leg swelling.  Gastrointestinal: Negative for nausea, vomiting, abdominal pain, diarrhea, constipation and blood in stool.  Genitourinary: Negative for dysuria, urgency, frequency, vaginal discharge, difficulty urinating, genital sores and pelvic pain.  Psychiatric/Behavioral: Positive for dysphoric mood. The patient is nervous/anxious.        Objective:   Physical Exam  Vitals reviewed. Constitutional: She is oriented to person, place, and time. She appears well-developed. No distress.  HENT:  Right Ear: External ear normal.  Left Ear: External ear normal.  Mouth/Throat: Oropharynx is clear and moist.  Neck: Normal range of motion. Neck supple. No tracheal deviation present. No thyromegaly present.  Cardiovascular: Normal rate, regular rhythm and normal heart sounds.  Exam reveals no gallop.   No murmur heard. Pulmonary/Chest: Effort normal and breath sounds normal.  Abdominal: Soft. She exhibits no distension. There is no tenderness.  Musculoskeletal: She exhibits no edema.  Lymphadenopathy:    She has no cervical adenopathy.  Neurological: She is alert and oriented to person, place, and time.  Skin: Skin is warm and dry. No rash noted.  Psychiatric: She has a normal mood and affect. Her behavior is normal. Thought content normal.  Breast exam: no masses;  axillae no adenopathy. GU and rectal exams deferred. See scanned labs dated 03/05/2013.       Assessment & Plan:  Well woman exam  Screening for osteoporosis - Plan: CANCELED: DG Bone Density  Need for prophylactic vaccination with combined diphtheria-tetanus-pertussis (DTP) vaccine - Plan: Td vaccine greater than or equal to 7yo preservative free IM  Postmenopausal - Plan: DG Bone Density  Recommend regular vision and dental exams; vitamin D and calcium;  healthy diet, weight loss and regular activity such as walking. Next physical in one year.

## 2013-08-15 ENCOUNTER — Other Ambulatory Visit: Payer: Self-pay

## 2013-08-17 ENCOUNTER — Other Ambulatory Visit: Payer: Self-pay

## 2013-08-22 ENCOUNTER — Telehealth (INDEPENDENT_AMBULATORY_CARE_PROVIDER_SITE_OTHER): Payer: Self-pay | Admitting: *Deleted

## 2013-08-22 NOTE — Telephone Encounter (Signed)
  Procedure: tcs  Reason/Indication:  Screening, fam hx colon ca  Has patient had this procedure before?  Yes, 2003 -- epic  If so, when, by whom and where?    Is there a family history of colon cancer?  Yes, sister  Who?  What age when diagnosed?    Is patient diabetic?   no      Does patient have prosthetic heart valve?  no  Do you have a pacemaker?  no  Has patient ever had endocarditis? no  Has patient had joint replacement within last 12 months?  no  Does patient tend to be constipated or take laxatives? no  Is patient on Coumadin, Plavix and/or Aspirin? no  Medications: see EPIC  Allergies: see EPIC  Medication Adjustment:   Procedure date & time: 09/13/13 at 730

## 2013-08-23 ENCOUNTER — Ambulatory Visit
Admission: RE | Admit: 2013-08-23 | Discharge: 2013-08-23 | Disposition: A | Discharge status: Home / Self Care | Payer: Self-pay | Source: Ambulatory Visit | Attending: Nurse Practitioner | Admitting: Nurse Practitioner

## 2013-08-23 DIAGNOSIS — Z780 Asymptomatic menopausal state: Secondary | ICD-10-CM

## 2013-08-23 NOTE — Telephone Encounter (Signed)
agree

## 2013-08-30 ENCOUNTER — Encounter (HOSPITAL_COMMUNITY): Payer: Self-pay | Admitting: Pharmacy Technician

## 2013-09-13 ENCOUNTER — Encounter (HOSPITAL_COMMUNITY)
Admission: RE | Disposition: A | Discharge status: Home / Self Care | Payer: Self-pay | Source: Ambulatory Visit | Attending: Internal Medicine

## 2013-09-13 ENCOUNTER — Encounter (HOSPITAL_COMMUNITY): Payer: Self-pay | Admitting: *Deleted

## 2013-09-13 ENCOUNTER — Ambulatory Visit (HOSPITAL_COMMUNITY)
Admission: RE | Admit: 2013-09-13 | Discharge: 2013-09-13 | Disposition: A | Discharge status: Home / Self Care | Payer: BC Managed Care – PPO | Source: Ambulatory Visit | Attending: Internal Medicine | Admitting: Internal Medicine

## 2013-09-13 DIAGNOSIS — Z1211 Encounter for screening for malignant neoplasm of colon: Secondary | ICD-10-CM

## 2013-09-13 DIAGNOSIS — F319 Bipolar disorder, unspecified: Secondary | ICD-10-CM | POA: Insufficient documentation

## 2013-09-13 DIAGNOSIS — Z800 Family history of malignant neoplasm of digestive organs: Secondary | ICD-10-CM | POA: Insufficient documentation

## 2013-09-13 DIAGNOSIS — K621 Rectal polyp: Secondary | ICD-10-CM

## 2013-09-13 DIAGNOSIS — Z79899 Other long term (current) drug therapy: Secondary | ICD-10-CM | POA: Insufficient documentation

## 2013-09-13 DIAGNOSIS — F411 Generalized anxiety disorder: Secondary | ICD-10-CM | POA: Insufficient documentation

## 2013-09-13 DIAGNOSIS — K644 Residual hemorrhoidal skin tags: Secondary | ICD-10-CM | POA: Insufficient documentation

## 2013-09-13 DIAGNOSIS — K5730 Diverticulosis of large intestine without perforation or abscess without bleeding: Secondary | ICD-10-CM | POA: Insufficient documentation

## 2013-09-13 DIAGNOSIS — E669 Obesity, unspecified: Secondary | ICD-10-CM | POA: Insufficient documentation

## 2013-09-13 DIAGNOSIS — K620 Anal polyp: Secondary | ICD-10-CM | POA: Insufficient documentation

## 2013-09-13 DIAGNOSIS — D126 Benign neoplasm of colon, unspecified: Secondary | ICD-10-CM | POA: Insufficient documentation

## 2013-09-13 DIAGNOSIS — R7301 Impaired fasting glucose: Secondary | ICD-10-CM | POA: Insufficient documentation

## 2013-09-13 HISTORY — PX: COLONOSCOPY: SHX5424

## 2013-09-13 SURGERY — COLONOSCOPY
Anesthesia: Moderate Sedation

## 2013-09-13 MED ORDER — STERILE WATER FOR IRRIGATION IR SOLN
Status: DC | PRN
  Administered 2013-09-13: 07:00:00

## 2013-09-13 MED ORDER — SODIUM CHLORIDE 0.9 % IV SOLN
INTRAVENOUS | Status: DC
  Administered 2013-09-13: 07:00:00 via INTRAVENOUS

## 2013-09-13 MED ORDER — MEPERIDINE HCL 50 MG/ML IJ SOLN
INTRAMUSCULAR | Status: AC
  Filled 2013-09-13: qty 1

## 2013-09-13 MED ORDER — MIDAZOLAM HCL 5 MG/5ML IJ SOLN
INTRAMUSCULAR | Status: DC | PRN
  Administered 2013-09-13: 3 mg via INTRAVENOUS
  Administered 2013-09-13: 1 mg via INTRAVENOUS
  Administered 2013-09-13 (×2): 2 mg via INTRAVENOUS

## 2013-09-13 MED ORDER — MIDAZOLAM HCL 5 MG/5ML IJ SOLN
INTRAMUSCULAR | Status: AC
  Filled 2013-09-13: qty 10

## 2013-09-13 MED ORDER — MEPERIDINE HCL 50 MG/ML IJ SOLN
INTRAMUSCULAR | Status: DC | PRN
  Administered 2013-09-13 (×2): 25 mg via INTRAVENOUS

## 2013-09-13 NOTE — Discharge Instructions (Signed)
No aspirin or NSAIDs for 1 week. Resume usual medications and high fiber diet. No driving for 24 hours. Physician will contact you with biopsy results.  Colonoscopy, Care After Refer to this sheet in the next few weeks. These instructions provide you with information on caring for yourself after your procedure. Your health care provider may also give you more specific instructions. Your treatment has been planned according to current medical practices, but problems sometimes occur. Call your health care provider if you have any problems or questions after your procedure. WHAT TO EXPECT AFTER THE PROCEDURE  After your procedure, it is typical to have the following:  A small amount of blood in your stool.  Moderate amounts of gas and mild abdominal cramping or bloating. HOME CARE INSTRUCTIONS  Do not drive, operate machinery, or sign important documents for 24 hours.  You may shower and resume your regular physical activities, but move at a slower pace for the first 24 hours.  Take frequent rest periods for the first 24 hours.  Walk around or put a warm pack on your abdomen to help reduce abdominal cramping and bloating.  Drink enough fluids to keep your urine clear or pale yellow.  You may resume your normal diet as instructed by your health care provider. Avoid heavy or fried foods that are hard to digest.  Avoid drinking alcohol for 24 hours or as instructed by your health care provider.  Only take over-the-counter or prescription medicines as directed by your health care provider.  SEEK MEDICAL CARE IF: You have persistent spotting of blood in your stool 2 3 days after the procedure. SEEK IMMEDIATE MEDICAL CARE IF:  You have more than a small spotting of blood in your stool.  You pass large blood clots in your stool.  Your abdomen is swollen (distended).  You have nausea or vomiting.  You have a fever.

## 2013-09-13 NOTE — H&P (Signed)
[name] is an 63 y.o. female.   Chief Complaint: Patient is here for colonoscopy. HPI: Patient is 63 year old Caucasian female was here for screening colonoscopy. She denies abdominal pain change in bowel habits or rectal bleeding. Last colonoscopy was in September 2003. Family history is significant for colon carcinoma sister who is 29 at the time of diagnosis and is doing fine.  Past Medical History  Diagnosis Date  . Bipolar 1 disorder   . Anxiety   . Cancer   . High cholesterol   . Breast cancer, left breast 07/22/2011  . Depression   . OA (osteoarthritis)   . Obesity   . IBS (irritable bowel syndrome)   . Polycystic ovarian disease   . Sleep apnea   . Impaired fasting glucose     Past Surgical History  Procedure Laterality Date  . Total knee arthroplasty Bilateral right knee    2012, 2007  . Cholecystectomy    . Abdominal hysterectomy    . Breast lumpectomy Left 2008  . Appendectomy      Family History  Problem Relation Age of Onset  . Bipolar disorder Mother   . Diabetes Mother   . Bipolar disorder Sister   . Diabetes Sister   . Alcohol abuse Father   . Heart disease Other    Social History:  reports that she has never smoked. She does not have any smokeless tobacco history on file. She reports that she does not drink alcohol or use illicit drugs.  Allergies:  Allergies  Allergen Reactions  . Imipramine Hives and Rash  . Tricor [Fenofibrate] Other (See Comments)    Pain, "aching"    Medications Prior to Admission  Medication Sig Dispense Refill  . ALPRAZolam (XANAX XR) 1 MG 24 hr tablet Take 1 mg by mouth 2 (two) times daily.        . Cholecalciferol (VITAMIN D3) 2000 UNITS TABS Take 2,000 Units by mouth daily.       [name] gemfibrozil (LOPID) 600 MG tablet TAKE 1 TABLET TWICE DAILY  180 tablet  0  . glucosamine-chondroitin 500-400 MG tablet Take 2 tablets by mouth daily.      [name] lamoTRIgine (LAMICTAL) 200 MG tablet Take 400 mg by mouth at bedtime.        . Multiple Vitamin (MULTIVITAMIN WITH MINERALS) TABS tablet Take 1 tablet by mouth daily.      . peg 3350 powder (MOVIPREP) 100 G SOLR Take 1 kit (200 g total) by mouth once.  1 kit  0  . QUEtiapine (SEROQUEL) 200 MG tablet Take 2 tablets (400 mg total) by mouth at bedtime. Take 300 mg at hs  30 tablet  0  . venlafaxine (EFFEXOR) 37.5 MG tablet Take 37.5 mg by mouth daily.        No results found for this or any previous visit (from the past 48 hour(s)). No results found.  ROS  Blood pressure 131/81, pulse 82, temperature 97.5 F (36.4 C), temperature source Oral, height _0  (1.626 m), weight 294 lb (133.358 kg), SpO2 93.00%. Physical Exam  Constitutional: She appears well-developed and well-nourished.  HENT:  Mouth/Throat: Oropharynx is clear and moist.  Eyes: Conjunctivae are normal. No scleral icterus.  Neck: No thyromegaly present.  Cardiovascular: Normal rate, regular rhythm and normal heart sounds.   No murmur heard. Respiratory: Effort normal and breath sounds normal.  GI: Soft. She exhibits no distension and no mass. There is no tenderness.  Musculoskeletal: She exhibits no edema.  Lymphadenopathy:    She has no cervical adenopathy.  Neurological: She is alert.  Skin: Skin is warm and dry.     Assessment/Plan Average risk screening colonoscopy. Family history of colon carcinoma in a sibling at late onset.  REHMAN,NAJEEB U 09/13/2013, 7:34 AM

## 2013-09-13 NOTE — Op Note (Signed)
COLONOSCOPY PROCEDURE REPORT  PATIENT:  [name]  MR#:  [id number] Birthdate:  1950/10/21, 63 y.o., female Endoscopist:  Dr. [name], MD Referred By:  Dr. [name], MD Procedure Date: 09/13/2013  Procedure:   Colonoscopy with snare polypectomy.  Indications: Patient is 63 year old Caucasian female who is undergoing screening colonoscopy. Family history significant for colon carcinoma in her sister but she was 25 at the time of diagnosis. Patient's last colonoscopy was in September 2003.  Informed Consent:  The procedure and risks were reviewed with the patient and informed consent was obtained.  Medications:  Demerol 50 mg IV Versed 8 mg IV  Description of procedure:  After a digital rectal exam was performed, that colonoscope was advanced from the anus through the rectum and colon to the area of the cecum, ileocecal valve and appendiceal orifice. The cecum was deeply intubated. These structures were well-seen and photographed for the record. From the level of the cecum and ileocecal valve, the scope was slowly and cautiously withdrawn. The mucosal surfaces were carefully surveyed utilizing scope tip to flexion to facilitate fold flattening as needed. The scope was pulled down into the rectum where a thorough exam including retroflexion was performed.  Findings:   Prep excellent. Few scattered diverticula at sigmoid colon. 7 mm polyp snared from distal sigmoid colon. Another 7 mm polyp snared from distal rectum close to dentate line. Small hemorrhoids below the dentate line.   Therapeutic/Diagnostic Maneuvers Performed:  See above  Complications:  None  Cecal Withdrawal Time:  17 minutes  Impression:  Examination performed to cecum. Mild sigmoid colon diverticulosis. 7 mm polyp snared from distal sigmoid colon. 7 mm polyp snared from distal rectum close to dentate line.  Recommendations:  Standard instructions given. I will contact patient with biopsy  results and further recommendations.  Damonte Frieson U  09/13/2013 8:18 AM  CC: Dr. [name], MD & Dr. Rayne Du ref. provider found

## 2013-09-17 ENCOUNTER — Encounter (HOSPITAL_COMMUNITY): Payer: Self-pay | Admitting: Internal Medicine

## 2013-09-26 ENCOUNTER — Other Ambulatory Visit: Payer: Self-pay | Admitting: Family Medicine

## 2013-09-27 ENCOUNTER — Other Ambulatory Visit: Payer: Self-pay | Admitting: *Deleted

## 2013-09-27 MED ORDER — GEMFIBROZIL 600 MG PO TABS: 600.0000 mg | ORAL_TABLET | Freq: Two times a day (BID) | ORAL | Status: DC

## 2013-10-01 ENCOUNTER — Encounter: Payer: Self-pay | Admitting: Family Medicine

## 2013-10-01 ENCOUNTER — Ambulatory Visit (INDEPENDENT_AMBULATORY_CARE_PROVIDER_SITE_OTHER): Payer: BC Managed Care – PPO | Admitting: Family Medicine

## 2013-10-01 VITALS — BP 128/82 | Ht 64.0 in | Wt 299.4 lb

## 2013-10-01 DIAGNOSIS — M79609 Pain in unspecified limb: Secondary | ICD-10-CM

## 2013-10-01 DIAGNOSIS — M79672 Pain in left foot: Secondary | ICD-10-CM

## 2013-10-01 NOTE — Progress Notes (Signed)
   Subjective:    Patient ID: [name], female    DOB: [birth year], 63 y.o.   MRN: [id number]  HPI  Patient arrives to have her papers filled out for the [facility]. Patient would also like place checked on left foot. Patient states full compliance with her medicine she denies feeling dizzy or woozy or nauseous with her medicine. She relates she is taking her medicines today and is not having any drowsiness. She states she is driven safely without troubles. She also sees her psychiatrist on a regular basis and follows the directions on her medicines. Review of Systems Denies any particular troubles currently no headache no drowsiness no chest pain shortness of breath    Objective:   Physical Exam  She is overweight. Lungs are clear. Heart is regular. Extremities no edema there is several calluses on the left foot.      Assessment & Plan:  Left foot pain is due to several calluses these were part down with a #15 blade without difficulty. 1 callus did draw a very small amount of blood when I pared it down.  Anxiety issues depression stable. She adheres to her medicine I feel she is safe driving. She should have a yearly exam regarding that and filling out of papers.  Depression to followup with specialist on regular basis

## 2013-11-08 ENCOUNTER — Ambulatory Visit (INDEPENDENT_AMBULATORY_CARE_PROVIDER_SITE_OTHER): Payer: BC Managed Care – PPO | Admitting: Family Medicine

## 2013-11-08 ENCOUNTER — Encounter: Payer: Self-pay | Admitting: Family Medicine

## 2013-11-08 VITALS — BP 124/84 | Ht 64.0 in | Wt 302.0 lb

## 2013-11-08 DIAGNOSIS — M545 Low back pain, unspecified: Secondary | ICD-10-CM | POA: Insufficient documentation

## 2013-11-08 DIAGNOSIS — M1990 Unspecified osteoarthritis, unspecified site: Secondary | ICD-10-CM | POA: Insufficient documentation

## 2013-11-08 NOTE — Progress Notes (Signed)
   Subjective:    Patient ID: [name], female    DOB: March 09, 1951, 63 y.o.   MRN: [id number]  HPIPatient needs [facility] No other concerns today.   This patient has significant problems with bilateral knee pain and discomfort she said need replacements whenever she stands for any significantly the time she has pain in the knees has to sit she also finds that she has low back pain and discomfort which requires frequent mood changes it is impossible for her to squat kneel to crawl to go up and down ladders.. Difficult to go up and down steps. She can only walk a couple 100 feet at a time without stopping she can only be on her feet for a few minutes at a time. She relates feeling in significant pain when she tries to do more.  She also has significant psychiatric illness with bipolar depression and anxiety for which she is disabled. She is under the care of psychiatry for this.  Review of Systems    she denies chest tightness pressure pain shortness of breath. Denies fever chills head congestion allergies. Objective:   Physical Exam Lungs are clear heart is regular pulses normal abdomen soft obese she has osteoarthritis noted in her hands as well as her knees she has previous knee surgery extremities trace edema she has significant low back pain and discomfort.       Assessment & Plan:  1. Osteoarthritis Patient with bilateral knee pain despite have a knee replacement has a lot of pain and pressure when she tries to stand cannot stand for any significant length of time without significant problems she relates ongoing issues. Unable to work because of all of this. She has been under the care of orthopedist but past couple years she has not had to see them because her really wasn't much more they can do.  2. Lumbar back pain Patient has chronic low back pain and discomfort. Hurts with sitting for any length of time hurts with standing as well. Uses  over-the-counter medications. Does try to do some stretches. She does not want to go through MRI. She avoids activity which worsens her back pain.  Patient is disabled. She is not able to do any job. She has severe bipolar anxiety and depression which prevents her from being able to work. Plus she also has significant physical limitations related to her knees and her back. This patient is not capable of working. I feel that this patient is completely disabled. Her primary problem is a psychiatric problem but her arthritis as well as her lumbar pain makes it difficult for her to do the physical nature of any job. Although she could do a sedentary job from a physical standpoint from a mental health standpoint I do not feel she is capable of doing that. Significant time was spent with patient and down during the history.

## 2013-11-26 ENCOUNTER — Ambulatory Visit (INDEPENDENT_AMBULATORY_CARE_PROVIDER_SITE_OTHER): Payer: BC Managed Care – PPO | Admitting: Nurse Practitioner

## 2013-11-26 ENCOUNTER — Encounter: Payer: Self-pay | Admitting: Nurse Practitioner

## 2013-11-26 VITALS — BP 166/106 | Ht 64.0 in | Wt 300.0 lb

## 2013-11-26 DIAGNOSIS — I10 Essential (primary) hypertension: Secondary | ICD-10-CM

## 2013-11-26 DIAGNOSIS — R002 Palpitations: Secondary | ICD-10-CM

## 2013-11-26 MED ORDER — LISINOPRIL 5 MG PO TABS: 5.0000 mg | ORAL_TABLET | Freq: Every day | ORAL | Status: DC

## 2013-11-28 ENCOUNTER — Encounter: Payer: Self-pay | Admitting: Nurse Practitioner

## 2013-11-28 DIAGNOSIS — R002 Palpitations: Secondary | ICD-10-CM | POA: Insufficient documentation

## 2013-11-28 DIAGNOSIS — I951 Orthostatic hypotension: Secondary | ICD-10-CM | POA: Insufficient documentation

## 2013-11-28 DIAGNOSIS — I10 Essential (primary) hypertension: Secondary | ICD-10-CM | POA: Insufficient documentation

## 2013-11-28 NOTE — Progress Notes (Signed)
Subjective:  Presents to discuss elevated BP. Has been elevated for a few months, worse over past 4 days. BP outside office 160/90 and 184/102 over the past 2 days. Has a complex mental health history including anxiety but denies any unusual stress. See Dr. Robina Ade her psychiatrist, had labs done there recently, results unavailable during office visit. No significant change in weight. Has become more sedentary. No unusual SOB. No edema. Occas feeling of "fullness" upper chest at base of throat. Unassociated with meals or activities. Rare reflux symptoms. No abd pain. occas skipped beat a few times per week, usually 3-4 times per episode. No syncope. Minimal caffeine intake. Denies any OTC meds or supplements.  Objective:   BP 166/106  Ht 5\' 4"  (1.626 m)  Wt 300 lb (136.079 kg)  BMI 51.47 kg/m2 NAD. Alert, oriented. Mildly anxious affect. Lungs clear. Heart RRR with rate of 72. No murmur or gallop noted. ECG normal. Carotids no bruits or thrills. Lower extremities no edema. BP on recheck 166/106.  Assessment: Hypertension - Plan: EKG 12-Lead, Ambulatory referral to Cardiology  Palpitations - Plan: Ambulatory referral to Cardiology  Plan:  Start Lisinopril 5 mg qd for BP. Start daily EC ASA 81 mg. Referral to cardiology for further evaluation. Warning signs reviewed. Call sooner or go to ED if worse.

## 2013-12-07 ENCOUNTER — Ambulatory Visit (INDEPENDENT_AMBULATORY_CARE_PROVIDER_SITE_OTHER): Payer: BC Managed Care – PPO | Admitting: Cardiovascular Disease

## 2013-12-07 ENCOUNTER — Encounter: Payer: Self-pay | Admitting: Cardiovascular Disease

## 2013-12-07 ENCOUNTER — Encounter: Payer: Self-pay | Admitting: *Deleted

## 2013-12-07 VITALS — BP 120/74 | HR 82 | Ht 64.0 in | Wt 296.0 lb

## 2013-12-07 DIAGNOSIS — I10 Essential (primary) hypertension: Secondary | ICD-10-CM

## 2013-12-07 DIAGNOSIS — R079 Chest pain, unspecified: Secondary | ICD-10-CM

## 2013-12-07 DIAGNOSIS — E781 Pure hyperglyceridemia: Secondary | ICD-10-CM

## 2013-12-07 NOTE — Progress Notes (Signed)
Patient ID: [name], female   DOB: 1951/05/19, 63 y.o.   MRN: [id number]       CARDIOLOGY CONSULT NOTE  Patient ID: [name] MRN: [id number] DOB/AGE: [birth year] 63 y.o.  Admit date: (Not on file) Primary Physician LUKING,SCOTT, MD  Reason for Consultation: chest pain  HPI: Patient is a 63 year old with a prior history of breast cancer status post lumpectomy, bipolar disorder and anxiety, hypertriglyceridemia, morbid obesity, and hypertension who has been referred for the evaluation of chest pain. A recent ECG demonstrated normal sinus rhythm with no diagnostic ST segment or T wave abnormalities. She was started on lisinopril 5 mg daily approximately one week ago, his blood pressure readings have been as high as 180/100. She has been experiencing exertional chest pressure for the past 3 weeks. Episodes may last up to an hour and are alleviated with rest. She denies associated shortness of breath, palpitations, nausea, and lightheadedness. She also denies leg swelling, orthopnea, and paroxysmal nocturnal dyspnea.  Soc: Nonsmoker. Previously a Marine scientist at [employer].   Allergies  Allergen Reactions  . Imipramine Hives and Rash  . Tricor [Fenofibrate] Other (See Comments)    Pain, "aching"    Current Outpatient Prescriptions  Medication Sig Dispense Refill  . ALPRAZolam (XANAX) 1 MG tablet Take 1 mg by mouth 2 (two) times daily as needed for anxiety.      . Cholecalciferol (VITAMIN D3) 2000 UNITS TABS Take 2,000 Units by mouth daily.       [name] gemfibrozil (LOPID) 600 MG tablet Take 1 tablet (600 mg total) by mouth 2 (two) times daily.  180 tablet  1  . glucosamine-chondroitin 500-400 MG tablet Take 2 tablets by mouth daily.      [name] lamoTRIgine (LAMICTAL) 200 MG tablet Take 400 mg by mouth at bedtime.       [name] lisinopril (PRINIVIL,ZESTRIL) 5 MG tablet Take 1 tablet (5 mg total) by mouth daily.  30 tablet  2  . Multiple Vitamin (MULTIVITAMIN WITH MINERALS) TABS tablet Take 1  tablet by mouth daily.      . QUEtiapine (SEROQUEL) 200 MG tablet Take 400 mg by mouth at bedtime.      [name] venlafaxine (EFFEXOR) 37.5 MG tablet Take 37.5 mg by mouth daily.       No current facility-administered medications for this visit.    Past Medical History  Diagnosis Date  . Bipolar 1 disorder   . Anxiety   . Cancer   . High cholesterol   . Breast cancer, left breast 07/22/2011  . Depression   . OA (osteoarthritis)   . Obesity   . IBS (irritable bowel syndrome)   . Polycystic ovarian disease   . Sleep apnea   . Impaired fasting glucose     Past Surgical History  Procedure Laterality Date  . Total knee arthroplasty Bilateral right knee    2012, 2007  . Cholecystectomy    . Abdominal hysterectomy    . Breast lumpectomy Left 2008  . Appendectomy    . Colonoscopy N/A 09/13/2013    Procedure: COLONOSCOPY;  Surgeon: [name], MD;  Location: [facility];  Service: Endoscopy;  Laterality: N/A;  730    History   Social History  . Marital Status: Married    Spouse Name: N/A    Number of Children: N/A  . Years of Education: N/A   Occupational History  . Not on file.   Social History Main Topics  . Smoking status: Never Smoker   .  Smokeless tobacco: Never Used  . Alcohol Use: No  . Drug Use: No  . Sexual Activity: No   Other Topics Concern  . Not on file   Social History Narrative   04/24/2013 [name] was born and grew up in [place]. She reports that her childhood was "tough." She has 2 older sisters and a younger brother. She achieved an Associates Degree in nursing. 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 She has 2 daughters and one son. She denies any legal difficulties. She affiliates as Engineer, manufacturing. Her hobbies include reading, sewing, crafts, and cooking. She reports that her social support system consists of her friend and passed her. 04/24/2013 [name]     No family history of premature CAD  in 1st degree relatives.  Prior to Admission medications   Medication Sig Start Date End Date Taking? Authorizing Provider  ALPRAZolam [name]) 1 MG tablet Take 1 mg by mouth 2 (two) times daily as needed for anxiety.   Yes Historical Provider, MD  Cholecalciferol (VITAMIN D3) 2000 UNITS TABS Take 2,000 Units by mouth daily.    Yes Historical Provider, MD  gemfibrozil (LOPID) 600 MG tablet Take 1 tablet (600 mg total) by mouth 2 (two) times daily. 09/27/13  Yes Kathyrn Drown, MD  glucosamine-chondroitin 500-400 MG tablet Take 2 tablets by mouth daily.   Yes Historical Provider, MD  lamoTRIgine (LAMICTAL) 200 MG tablet Take 400 mg by mouth at bedtime.    Yes Historical Provider, MD  lisinopril (PRINIVIL,ZESTRIL) 5 MG tablet Take 1 tablet (5 mg total) by mouth daily. 11/26/13  Yes Nilda Simmer, NP  Multiple Vitamin (MULTIVITAMIN WITH MINERALS) TABS tablet Take 1 tablet by mouth daily.   Yes Historical Provider, MD  QUEtiapine (SEROQUEL) 200 MG tablet Take 400 mg by mouth at bedtime. 05/07/13  Yes [name], MD  venlafaxine (EFFEXOR) 37.5 MG tablet Take 37.5 mg by mouth daily.   Yes Historical Provider, MD     Review of systems complete and found to be negative unless listed above in HPI     Physical exam Blood pressure 120/74, pulse 82, height 5\' 4"  (1.626 m), weight 296 lb (134.265 kg), SpO2 94.00%. General: NAD, obese Neck: No JVD, no thyromegaly or thyroid nodule.  Lungs: Clear to auscultation bilaterally with normal respiratory effort. CV: Nondisplaced PMI.  Heart regular S1/S2, no S3/S4, no murmur.  No peripheral edema.  No carotid bruit.  Normal pedal pulses.  Abdomen: Soft, nontender, no hepatosplenomegaly, no distention.  Skin: Intact without lesions or rashes.  Neurologic: Alert and oriented x 3.  Psych: Normal affect. Extremities: No clubbing or cyanosis.  HEENT: Normal.   Labs:   Lab Results  Component Value Date   WBC 6.2 07/22/2011   HGB 13.3 07/22/2011     HCT 40.1 07/22/2011   MCV 92.5 07/22/2011   PLT 299 07/22/2011   No results found for this basename: NA, K, CL, CO2, BUN, CREATININE, CALCIUM, LABALBU, PROT, BILITOT, ALKPHOS, ALT, AST, GLUCOSE,  in the last 168 hours No results found for this basename: CKTOTAL, CKMB, CKMBINDEX, TROPONINI    No results found for this basename: CHOL   No results found for this basename: HDL   No results found for this basename: LDLCALC   No results found for this basename: TRIG   No results found for this basename: CHOLHDL   No results found for this basename: LDLDIRECT  Studies: No results found.  ASSESSMENT AND PLAN:  1. Chest pain: Her symptoms have both typical and atypical features. She does have a history of hypertension, hypertriglyceridemia, and obesity. I will proceed with an echocardiogram to evaluate for structural heart disease as well as a Lexiscan Cardiolite stress test to evaluate for ischemia. 2. HTN: Controlled on present medical therapy. She appears to have elevated readings at home. I've asked her bring in her blood pressure cuff to make certain that it is appropriately calibrated.  Dispo: f/u 1 month.  Signed: Kate Sable, M.D., F.A.C.C.  12/07/2013, 2:23 PM

## 2013-12-07 NOTE — Patient Instructions (Signed)
Your physician has requested that you have an echocardiogram. Echocardiography is a painless test that uses sound waves to create images of your heart. It provides your doctor with information about the size and shape of your heart and how well your heart's chambers and valves are working. This procedure takes approximately one hour. There are no restrictions for this procedure. Your physician has requested that you have a lexiscan myoview. For further information please visit [email or web address]. Please follow instruction sheet, as given. Office will contact with results via phone or letter.   Continue all current medications. Follow up in  1 month   

## 2013-12-12 ENCOUNTER — Telehealth: Payer: Self-pay | Admitting: *Deleted

## 2013-12-12 NOTE — Telephone Encounter (Signed)
Patient in office this afternoon for BP check & comparison with her cuff.    BP - 126/78  80 - our dinamap  BP - 128/74  - her manual cuff  States that she does get different readings at home though.  Advised her to keep log & bring to next OV on 01/09/14 for MD review.  Patient verbalized understanding.

## 2013-12-18 ENCOUNTER — Other Ambulatory Visit: Payer: Self-pay

## 2013-12-18 ENCOUNTER — Other Ambulatory Visit (INDEPENDENT_AMBULATORY_CARE_PROVIDER_SITE_OTHER): Payer: BC Managed Care – PPO

## 2013-12-18 DIAGNOSIS — R079 Chest pain, unspecified: Secondary | ICD-10-CM

## 2013-12-21 ENCOUNTER — Encounter (HOSPITAL_COMMUNITY)
Admission: RE | Admit: 2013-12-21 | Discharge: 2013-12-21 | Disposition: A | Discharge status: Home / Self Care | Payer: BC Managed Care – PPO | Source: Ambulatory Visit | Attending: Cardiovascular Disease | Admitting: Cardiovascular Disease

## 2013-12-21 ENCOUNTER — Encounter (HOSPITAL_COMMUNITY): Payer: Self-pay

## 2013-12-21 ENCOUNTER — Ambulatory Visit (HOSPITAL_COMMUNITY)
Admission: RE | Admit: 2013-12-21 | Discharge: 2013-12-21 | Disposition: A | Discharge status: Home / Self Care | Payer: BC Managed Care – PPO | Source: Ambulatory Visit | Attending: Cardiovascular Disease | Admitting: Cardiovascular Disease

## 2013-12-21 DIAGNOSIS — R079 Chest pain, unspecified: Secondary | ICD-10-CM | POA: Insufficient documentation

## 2013-12-21 DIAGNOSIS — Z853 Personal history of malignant neoplasm of breast: Secondary | ICD-10-CM | POA: Insufficient documentation

## 2013-12-21 DIAGNOSIS — I10 Essential (primary) hypertension: Secondary | ICD-10-CM | POA: Insufficient documentation

## 2013-12-21 MED ORDER — TECHNETIUM TC 99M SESTAMIBI - CARDIOLITE
30.0000 | Freq: Once | INTRAVENOUS | Status: AC | PRN
  Administered 2013-12-21: 12:00:00 30 via INTRAVENOUS

## 2013-12-21 MED ORDER — REGADENOSON 0.4 MG/5ML IV SOLN
INTRAVENOUS | Status: AC
  Administered 2013-12-21: 0.4 mg via INTRAVENOUS
  Filled 2013-12-21: qty 5

## 2013-12-21 MED ORDER — SODIUM CHLORIDE 0.9 % IJ SOLN
INTRAMUSCULAR | Status: AC
  Administered 2013-12-21: 10 mL via INTRAVENOUS
  Filled 2013-12-21: qty 10

## 2013-12-21 MED ORDER — TECHNETIUM TC 99M SESTAMIBI GENERIC - CARDIOLITE
10.0000 | Freq: Once | INTRAVENOUS | Status: AC | PRN
  Administered 2013-12-21: 10 via INTRAVENOUS

## 2013-12-21 NOTE — Progress Notes (Signed)
Stress Lab Nurses Notes - [name]  [name] 12/21/2013 Reason for doing test: Chest Pain Type of test: Wille Glaser Nurse performing test: Gerrit Halls, RN Nuclear Medicine Tech: Redmond Baseman Echo Tech: Not Applicable MD performing test: S. McDowell/K.Renelda Mom Family MD: Pearson Forster NP Test explained and consent signed: yes IV started: 22g jelco, Saline lock flushed, No redness or edema and Saline lock started in radiology Symptoms: nausea & headach Treatment/Intervention: None Reason test stopped: protocol completed After recovery IV was: Discontinued via X-ray tech and No redness or edema Patient to return to [place]. Med at :12:50 Patient discharged: Home Patient's Condition upon discharge was: stable Comments: During test BP 108/66 & HR 89.  Recovery BP 119/65 & HR 81.  Symptoms resolved in recovery. Geanie Cooley T

## 2013-12-24 ENCOUNTER — Encounter: Payer: Self-pay | Admitting: *Deleted

## 2013-12-25 ENCOUNTER — Other Ambulatory Visit: Payer: Self-pay

## 2013-12-25 ENCOUNTER — Emergency Department (HOSPITAL_COMMUNITY): Payer: BC Managed Care – PPO

## 2013-12-25 ENCOUNTER — Emergency Department (HOSPITAL_COMMUNITY)
Admission: EM | Admit: 2013-12-25 | Discharge: 2013-12-25 | Disposition: A | Discharge status: Home / Self Care | Payer: BC Managed Care – PPO | Attending: Emergency Medicine | Admitting: Emergency Medicine

## 2013-12-25 ENCOUNTER — Encounter (HOSPITAL_COMMUNITY): Payer: Self-pay | Admitting: Emergency Medicine

## 2013-12-25 DIAGNOSIS — Z853 Personal history of malignant neoplasm of breast: Secondary | ICD-10-CM | POA: Insufficient documentation

## 2013-12-25 DIAGNOSIS — Z79899 Other long term (current) drug therapy: Secondary | ICD-10-CM | POA: Insufficient documentation

## 2013-12-25 DIAGNOSIS — Z8739 Personal history of other diseases of the musculoskeletal system and connective tissue: Secondary | ICD-10-CM | POA: Insufficient documentation

## 2013-12-25 DIAGNOSIS — R1013 Epigastric pain: Secondary | ICD-10-CM | POA: Insufficient documentation

## 2013-12-25 DIAGNOSIS — E7800 Pure hypercholesterolemia, unspecified: Secondary | ICD-10-CM | POA: Insufficient documentation

## 2013-12-25 DIAGNOSIS — E669 Obesity, unspecified: Secondary | ICD-10-CM | POA: Insufficient documentation

## 2013-12-25 DIAGNOSIS — Z8719 Personal history of other diseases of the digestive system: Secondary | ICD-10-CM | POA: Insufficient documentation

## 2013-12-25 DIAGNOSIS — Z8742 Personal history of other diseases of the female genital tract: Secondary | ICD-10-CM | POA: Insufficient documentation

## 2013-12-25 DIAGNOSIS — R109 Unspecified abdominal pain: Secondary | ICD-10-CM

## 2013-12-25 DIAGNOSIS — F319 Bipolar disorder, unspecified: Secondary | ICD-10-CM | POA: Insufficient documentation

## 2013-12-25 DIAGNOSIS — F411 Generalized anxiety disorder: Secondary | ICD-10-CM | POA: Insufficient documentation

## 2013-12-25 LAB — COMPREHENSIVE METABOLIC PANEL
ALK PHOS: 136 U/L — AB (ref 39–117)
ALT: 22 U/L (ref 0–35)
AST: 21 U/L (ref 0–37)
Albumin: 4 g/dL (ref 3.5–5.2)
BUN: 15 mg/dL (ref 6–23)
CHLORIDE: 102 meq/L (ref 96–112)
CO2: 27 mEq/L (ref 19–32)
Calcium: 10 mg/dL (ref 8.4–10.5)
Creatinine, Ser: 0.64 mg/dL (ref 0.50–1.10)
GLUCOSE: 121 mg/dL — AB (ref 70–99)
POTASSIUM: 4.1 meq/L (ref 3.7–5.3)
SODIUM: 142 meq/L (ref 137–147)
Total Bilirubin: 0.4 mg/dL (ref 0.3–1.2)
Total Protein: 8 g/dL (ref 6.0–8.3)

## 2013-12-25 LAB — CBC WITH DIFFERENTIAL/PLATELET
Basophils Absolute: 0 10*3/uL (ref 0.0–0.1)
Basophils Relative: 0 % (ref 0–1)
EOS PCT: 0 % (ref 0–5)
Eosinophils Absolute: 0 10*3/uL (ref 0.0–0.7)
HCT: 41 % (ref 36.0–46.0)
Hemoglobin: 13.8 g/dL (ref 12.0–15.0)
LYMPHS ABS: 1.5 10*3/uL (ref 0.7–4.0)
Lymphocytes Relative: 16 % (ref 12–46)
MCH: 30.9 pg (ref 26.0–34.0)
MCHC: 33.7 g/dL (ref 30.0–36.0)
MCV: 91.7 fL (ref 78.0–100.0)
Monocytes Absolute: 1 10*3/uL (ref 0.1–1.0)
Monocytes Relative: 10 % (ref 3–12)
NEUTROS PCT: 74 % (ref 43–77)
Neutro Abs: 6.9 10*3/uL (ref 1.7–7.7)
Platelets: 320 10*3/uL (ref 150–400)
RBC: 4.47 MIL/uL (ref 3.87–5.11)
RDW: 13.6 % (ref 11.5–15.5)
WBC: 9.4 10*3/uL (ref 4.0–10.5)

## 2013-12-25 LAB — LIPASE, BLOOD: Lipase: 34 U/L (ref 11–59)

## 2013-12-25 LAB — TROPONIN I: Troponin I: <0.3 ng/mL (ref <0.30)

## 2013-12-25 MED ORDER — IOHEXOL 300 MG/ML  SOLN
100.0000 mL | Freq: Once | INTRAMUSCULAR | Status: AC | PRN
  Administered 2013-12-25: 100 mL via INTRAVENOUS

## 2013-12-25 MED ORDER — IOHEXOL 300 MG/ML  SOLN
25.0000 mL | Freq: Once | INTRAMUSCULAR | Status: AC | PRN
  Administered 2013-12-25: 25 mL via ORAL

## 2013-12-25 MED ORDER — IOHEXOL 300 MG/ML  SOLN
50.0000 mL | Freq: Once | INTRAMUSCULAR | Status: DC | PRN
Start: 2013-12-25 — End: 2013-12-25

## 2013-12-25 MED ORDER — ONDANSETRON HCL 4 MG/2ML IJ SOLN
4.0000 mg | Freq: Once | INTRAMUSCULAR | Status: AC
  Administered 2013-12-25: 4 mg via INTRAVENOUS
  Filled 2013-12-25: qty 2

## 2013-12-25 MED ORDER — HYDROMORPHONE HCL PF 1 MG/ML IJ SOLN
0.5000 mg | Freq: Once | INTRAMUSCULAR | Status: AC
  Administered 2013-12-25: 0.5 mg via INTRAVENOUS
  Filled 2013-12-25: qty 1

## 2013-12-25 MED ORDER — PANTOPRAZOLE SODIUM 40 MG IV SOLR
40.0000 mg | Freq: Once | INTRAVENOUS | Status: AC
  Administered 2013-12-25: 40 mg via INTRAVENOUS
  Filled 2013-12-25: qty 40

## 2013-12-25 MED ORDER — PANTOPRAZOLE SODIUM 20 MG PO TBEC: 20.0000 mg | DELAYED_RELEASE_TABLET | Freq: Every day | ORAL | Status: DC

## 2013-12-25 NOTE — ED Notes (Signed)
Pt c/o "a ball like" feeling to epigastric area with nausea since yesterday. Denies anything making it better or worse. Denies sob. Denies radiation of pain.denies dizziness. Pt is non diaphoretic. nad at this time. Denies v/d

## 2013-12-25 NOTE — ED Provider Notes (Signed)
CSN: [id number]     Arrival date & time 12/25/13  7829 History  This chart was scribed for Maudry Diego, MD by Roe Coombs, ED Scribe. The patient was seen in room APA14/APA14. Patient's care was started at 10:31 AM.  Chief Complaint  Patient presents with  . Abdominal Pain    Patient is a 63 y.o. female presenting with abdominal pain. The history is provided by the patient. No language interpreter was used.  Abdominal Pain Pain location:  Epigastric Pain quality: burning   Pain severity:  Moderate Duration:  2 days Timing:  Constant Chronicity:  New Ineffective treatments:  Antacids (Tums, Pepto Bismol, Pepcid) Associated symptoms: no chest pain, no chills, no cough, no diarrhea, no fatigue, no fever, no hematuria, no nausea and no vomiting   Risk factors: obesity    HPI Comments: [name] is a 63 y.o. female who presents to the Emergency Department complaining of constant, moderate, burning epigastric abdominal pain beginning yesterday morning when she woke up. She reports that she had an echo and a stress test last week, but she is unsure of the results of either. She states that she took Tums, Pepto Bismol, and Pepcid with no relief. She denies nausea, vomiting, diarrhea, fever, or chills. She has a history of cholecystectomy and appendectomy. She also has a history of breast cancer (7 years ago).   Dr. Wolfgang Phoenix is PCP Dr. Bronson Ing Is cardiologist.   Past Medical History  Diagnosis Date  . Bipolar 1 disorder   . Anxiety   . Cancer   . High cholesterol   . Breast cancer, left breast 07/22/2011  . Depression   . OA (osteoarthritis)   . Obesity   . IBS (irritable bowel syndrome)   . Polycystic ovarian disease   . Sleep apnea   . Impaired fasting glucose    Past Surgical History  Procedure Laterality Date  . Total knee arthroplasty Bilateral right knee    2012, 2007  . Cholecystectomy    . Abdominal hysterectomy    . Breast lumpectomy Left 2008  .  Appendectomy    . Colonoscopy N/A 09/13/2013    Procedure: COLONOSCOPY;  Surgeon: [name], MD;  Location: [facility];  Service: Endoscopy;  Laterality: N/A;  730   Family History  Problem Relation Age of Onset  . Bipolar disorder Mother   . Diabetes Mother   . Bipolar disorder Sister   . Diabetes Sister   . Alcohol abuse Father   . Hypertension Father   . Heart disease Other    History  Substance Use Topics  . Smoking status: Never Smoker   . Smokeless tobacco: Never Used  . Alcohol Use: No   OB History   Grav Para Term Preterm Abortions TAB SAB Ect Mult Living                 Review of Systems  Constitutional: Negative for fever, chills, appetite change and fatigue.  HENT: Negative for congestion, ear discharge and sinus pressure.   Eyes: Negative for discharge.  Respiratory: Negative for cough.   Cardiovascular: Negative for chest pain.  Gastrointestinal: Positive for abdominal pain. Negative for nausea, vomiting and diarrhea.  Genitourinary: Negative for frequency and hematuria.  Musculoskeletal: Negative for back pain.  Skin: Negative for rash.  Neurological: Negative for seizures and headaches.  Psychiatric/Behavioral: Negative for hallucinations.    Allergies  Imipramine and Tricor  Home Medications   Prior to Admission medications   Medication  Sig Start Date End Date Taking? Authorizing Dade Rodin  ALPRAZolam [name]) 1 MG tablet Take 1 mg by mouth 2 (two) times daily as needed for anxiety.    Historical Hadyn Blanck, MD  Cholecalciferol (VITAMIN D3) 2000 UNITS TABS Take 2,000 Units by mouth daily.     Historical Jair Lindblad, MD  gemfibrozil (LOPID) 600 MG tablet Take 1 tablet (600 mg total) by mouth 2 (two) times daily. 09/27/13   Kathyrn Drown, MD  glucosamine-chondroitin 500-400 MG tablet Take 2 tablets by mouth daily.    Historical Jayliani Wanner, MD  lamoTRIgine (LAMICTAL) 200 MG tablet Take 400 mg by mouth at bedtime.     Historical Kortne All, MD  lisinopril  (PRINIVIL,ZESTRIL) 5 MG tablet Take 1 tablet (5 mg total) by mouth daily. 11/26/13   Nilda Simmer, NP  Multiple Vitamin (MULTIVITAMIN WITH MINERALS) TABS tablet Take 1 tablet by mouth daily.    Historical Mose Colaizzi, MD  QUEtiapine (SEROQUEL) 200 MG tablet Take 400 mg by mouth at bedtime. 05/07/13   [name], MD  venlafaxine (EFFEXOR) 37.5 MG tablet Take 37.5 mg by mouth daily.    Historical Thorn Demas, MD   BP 147/79  Pulse 78  Temp(Src) 98.2 F (36.8 C) (Oral)  Resp 18  Ht 5\' 4"  (1.626 m)  Wt 294 lb (133.358 kg)  BMI 50.44 kg/m2  SpO2 95% Physical Exam  Constitutional: She is oriented to person, place, and time. She appears well-developed.  HENT:  Head: Normocephalic.  Eyes: Conjunctivae and EOM are normal. No scleral icterus.  Neck: Neck supple. No thyromegaly present.  Cardiovascular: Normal rate and regular rhythm.  Exam reveals no gallop and no friction rub.   No murmur heard. Pulmonary/Chest: No stridor. She has no wheezes. She has no rales. She exhibits no tenderness.  Abdominal: She exhibits no distension. There is tenderness. There is no rebound.  Minor epigastric tenderness.   Musculoskeletal: Normal range of motion. She exhibits no edema.  Lymphadenopathy:    She has no cervical adenopathy.  Neurological: She is oriented to person, place, and time. She exhibits normal muscle tone. Coordination normal.  Skin: No rash noted. No erythema.  Psychiatric: She has a normal mood and affect. Her behavior is normal.    ED Course  Procedures (including critical care time) DIAGNOSTIC STUDIES: Oxygen Saturation is 95% on room air, adquate by my interpretation.    COORDINATION OF CARE: 10:36 AM- Ordered EKG, Protonix, Dilaudid, Zofran, x-ray of abdomen, troponin, blood lipase, CBC with diff, and CMP. Patient informed of current plan for treatment and evaluation and agrees with plan at this time.     Labs Review Labs Reviewed  COMPREHENSIVE METABOLIC PANEL -  Abnormal; Notable for the following:    Glucose, Bld 121 (*)    Alkaline Phosphatase 136 (*)    All other components within normal limits  CBC WITH DIFFERENTIAL  LIPASE, BLOOD  TROPONIN I    Imaging Review No results found.   EKG Interpretation None    nm cardiac study nl.  MDM   Final diagnoses:  None  abdominal pain.  Pt to follow up with pcp and start protonix The chart was scribed for me under my direct supervision.  I personally performed the history, physical, and medical decision making and all procedures in the evaluation of this patient.Maudry Diego, MD 12/25/13 [phone]

## 2013-12-25 NOTE — Discharge Instructions (Signed)
Follow up with your family md next week. °

## 2013-12-25 NOTE — ED Notes (Signed)
Pt removed oxygen. Pt o2 sats on room air wnl. Oxygen remains off at this time. nad noted.

## 2013-12-31 ENCOUNTER — Other Ambulatory Visit: Payer: Self-pay | Admitting: Family Medicine

## 2013-12-31 ENCOUNTER — Telehealth: Payer: Self-pay | Admitting: Family Medicine

## 2013-12-31 ENCOUNTER — Telehealth: Payer: Self-pay | Admitting: *Deleted

## 2013-12-31 DIAGNOSIS — R109 Unspecified abdominal pain: Secondary | ICD-10-CM

## 2013-12-31 NOTE — Telephone Encounter (Signed)
Patient stated that she had to go to ED this weekend due to severe epigastric pain.  Also, noted patient has already placed call to her PMD requesting GI referral.  Patient questions if any of her medications could be causing this.  Questions the Lisinopril in particular.

## 2013-12-31 NOTE — Telephone Encounter (Signed)
Notified patient that we will start the referral process. Patient verbalized understanding.

## 2013-12-31 NOTE — Telephone Encounter (Signed)
Tell pt we will initiate the process.

## 2013-12-31 NOTE — Telephone Encounter (Signed)
Unlikely to be lisinopril. Will await GI recs.

## 2013-12-31 NOTE — Telephone Encounter (Signed)
Patient is still having epigastric and abd. Pain and would like a referral to Dr. Laural Golden. She was seen on 12/25/13 for abd. Pain.

## 2014-01-01 NOTE — Telephone Encounter (Signed)
Patient notified.  Patient already has 1 month follow up scheduled for 01/09/2014 with Dr. Bronson Ing.

## 2014-01-02 ENCOUNTER — Encounter (INDEPENDENT_AMBULATORY_CARE_PROVIDER_SITE_OTHER): Payer: Self-pay | Admitting: *Deleted

## 2014-01-08 ENCOUNTER — Ambulatory Visit (INDEPENDENT_AMBULATORY_CARE_PROVIDER_SITE_OTHER): Payer: BC Managed Care – PPO | Admitting: Family Medicine

## 2014-01-08 ENCOUNTER — Encounter: Payer: Self-pay | Admitting: Family Medicine

## 2014-01-08 VITALS — BP 138/82 | Ht 64.0 in | Wt 297.0 lb

## 2014-01-08 DIAGNOSIS — K2100 Gastro-esophageal reflux disease with esophagitis, without bleeding: Secondary | ICD-10-CM

## 2014-01-08 DIAGNOSIS — K219 Gastro-esophageal reflux disease without esophagitis: Secondary | ICD-10-CM | POA: Insufficient documentation

## 2014-01-08 MED ORDER — PANTOPRAZOLE SODIUM 40 MG PO TBEC: 40.0000 mg | DELAYED_RELEASE_TABLET | Freq: Every day | ORAL | Status: DC

## 2014-01-08 NOTE — Progress Notes (Signed)
   Subjective:    Patient ID: [name], female    DOB: 02-26-1951, 63 y.o.   MRN: [id number]  HPI Patient is here today for a ER follow up 6/16 for abdominal pain.  They told her it was epigastric and not cardiac related. Patient had Myoview which was rated as low risk. She does have followup with cardiology within the next few weeks. She is now taking Protonix 20mg  BID.  Her abdominal pain and epigastric pain comes and goes.  Patient states he's still a bit better compared to help she was. Does not wake her up at night not exercise associated. She does drink about 3-4 cups of coffee daily.  ER notes, lab work was reviewed in detail.   Review of Systems She relates epigastric discomfort burning in the chest she denies vomiting relates some nausea no diarrhea denies any type of swelling of the legs    Objective:   Physical Exam  Lungs are clear hearts regular pulse normal abdomen soft mild epigastric tenderness      Assessment & Plan:  Esophagitis related to GERD-cut back on caffeine. Stay physically active. Tried to do better with watching diet. Continue medication we will switch to 40 mg protonix. Followup with gastroenterology. I believe this patient will indent needing to have EGD. This will help rule out any pathology other than GERD. Nonemergent.  Certainly if worsens let us know she sees gastroenterology in early July

## 2014-01-09 ENCOUNTER — Ambulatory Visit: Payer: Self-pay | Admitting: Cardiovascular Disease

## 2014-01-14 ENCOUNTER — Ambulatory Visit (INDEPENDENT_AMBULATORY_CARE_PROVIDER_SITE_OTHER): Payer: BC Managed Care – PPO | Admitting: Internal Medicine

## 2014-01-14 ENCOUNTER — Encounter (INDEPENDENT_AMBULATORY_CARE_PROVIDER_SITE_OTHER): Payer: Self-pay | Admitting: Internal Medicine

## 2014-01-14 ENCOUNTER — Other Ambulatory Visit (INDEPENDENT_AMBULATORY_CARE_PROVIDER_SITE_OTHER): Payer: Self-pay | Admitting: *Deleted

## 2014-01-14 VITALS — BP 116/72 | HR 60 | Temp 97.7°F | Ht 64.0 in | Wt 295.3 lb

## 2014-01-14 DIAGNOSIS — K219 Gastro-esophageal reflux disease without esophagitis: Secondary | ICD-10-CM

## 2014-01-14 NOTE — Progress Notes (Signed)
Subjective:     Patient ID: [name], female   DOB: 1950-10-06, 63 y.o.   MRN: [id number]  HPI Here today with c/o of burning pain in her chest. Seen in the ED and cardiac work up was negative. She had 2 episodes of burning in her chest. She felt raw in her chest. She has had these symptoms off and on over the years and self treated herself with Tums.  Dr. Wolfgang Phoenix placed on her Protonix 40mg  daily which helped.  She is having acid reflux. She has cut her coffee intake down to 1/2 cup a day from 3 cups a day. She is avoiding spicy foods.  Appetite is better. She does have some epigastric tenderness. She usually has 2-3 BMs a day. No weight loss.  No NSAIDs   12/25/2013 CT abdomen/pelvis with CM: Palpable mass epigastric. [name] MPRESSION:  No acute findings or explanation for the patient's symptoms.    12/25/2013 Acute abdomen: chest pain, abdominal pain, Nausea: IMPRESSION:  Negative abdominal radiographs. No acute cardiopulmonary disease.    12/25/2013 IMPRESSION:  Low to intermediate risk abnormal Lexiscan Cardiolite. There were no  diagnostic ST segment abnormalities. Perfusion imaging shows  evidence of potential anterior wall ischemia in the LAD distribution  as outlined above, although breast attenuation is also evident and  may be contributing to this partially reversible zone. LVEF is  calculated at 86% with normal volumes and wall motion.  09/13/2013 Colonoscopy: Impression:  Examination performed to cecum.  Mild sigmoid colon diverticulosis.  7 mm polyp snared from distal sigmoid colon.  7 mm polyp snared from distal rectum close to dentate line.   Review of Systems Past Medical History  Diagnosis Date  . Bipolar 1 disorder   . Anxiety   . Cancer breast cancer 7 yrs ago  . High cholesterol   . Breast cancer, left breast 07/22/2011  . Depression   . OA (osteoarthritis)   . Obesity   . IBS (irritable bowel syndrome)   . Polycystic ovarian disease   . Sleep apnea    . Impaired fasting glucose     Past Surgical History  Procedure Laterality Date  . Total knee arthroplasty Bilateral right knee    2012, 2007  . Cholecystectomy    . Abdominal hysterectomy    . Breast lumpectomy Left 2008  . Appendectomy    . Colonoscopy N/A 09/13/2013    Procedure: COLONOSCOPY;  Surgeon: [name], MD;  Location: [facility];  Service: Endoscopy;  Laterality: N/A;  730    Allergies  Allergen Reactions  . Imipramine Hives and Rash  . Tricor [Fenofibrate] Other (See Comments)    Pain, "aching"    Current Outpatient Prescriptions on File Prior to Visit  Medication Sig Dispense Refill  . ALPRAZolam (XANAX XR) 1 MG 24 hr tablet Take 1 mg by mouth 2 (two) times daily.      [name] ALPRAZolam (XANAX) 0.5 MG tablet Take 0.25 mg by mouth daily as needed for anxiety.      [name] gemfibrozil (LOPID) 600 MG tablet Take 1 tablet (600 mg total) by mouth 2 (two) times daily.  180 tablet  1  . glucosamine-chondroitin 500-400 MG tablet Take 2 tablets by mouth daily.      [name] lamoTRIgine (LAMICTAL) 200 MG tablet Take 400 mg by mouth at bedtime.       [name] lisinopril (PRINIVIL,ZESTRIL) 5 MG tablet Take 1 tablet (5 mg total) by mouth daily.  30 tablet  2  .  Multiple Vitamin (MULTIVITAMIN WITH MINERALS) TABS tablet Take 1 tablet by mouth daily.      . pantoprazole (PROTONIX) 40 MG tablet Take 1 tablet (40 mg total) by mouth daily.  30 tablet  3  . QUEtiapine (SEROQUEL) 200 MG tablet Take 400 mg by mouth at bedtime.      [name] venlafaxine (EFFEXOR) 37.5 MG tablet Take 37.5 mg by mouth daily.      . Vitamin D, Ergocalciferol, (DRISDOL) 50000 UNITS CAPS capsule Take 50,000 Units by mouth every 7 (seven) days.       No current facility-administered medications on file prior to visit.        Objective:   Physical Exam  Filed Vitals:   01/14/14 1552  BP: 116/72  Pulse: 60  Temp: 97.7 F (36.5 C)  Height: 5\' 4"  (1.626 m)  Weight: 295 lb 4.8 oz (133.947 kg)  Alert and oriented. Skin warm  and dry. Oral mucosa is moist.   . Sclera anicteric, conjunctivae is pink. Thyroid not enlarged. No cervical lymphadenopathy. Lungs clear. Heart regular rate and rhythm.  Abdomen is soft. Bowel sounds are positive. No hepatomegaly. No abdominal masses felt. Epigastric tenderness.  No edema to lower extremities.        Assessment:     GERD. PUD needs to be ruled out.    Plan:    The risks and benefits such as perforation, bleeding, and infection were reviewed with the patient and is agreeable. EGD.      Here

## 2014-01-14 NOTE — Patient Instructions (Signed)
EGD. The risks and benefits such as perforation, bleeding, and infection were reviewed with the patient and is agreeable. 

## 2014-01-16 ENCOUNTER — Ambulatory Visit (INDEPENDENT_AMBULATORY_CARE_PROVIDER_SITE_OTHER): Payer: BC Managed Care – PPO | Admitting: Cardiovascular Disease

## 2014-01-16 ENCOUNTER — Encounter: Payer: Self-pay | Admitting: Cardiovascular Disease

## 2014-01-16 ENCOUNTER — Other Ambulatory Visit (INDEPENDENT_AMBULATORY_CARE_PROVIDER_SITE_OTHER): Payer: Self-pay | Admitting: *Deleted

## 2014-01-16 VITALS — BP 112/72 | HR 73 | Ht 64.0 in | Wt 300.0 lb

## 2014-01-16 DIAGNOSIS — K219 Gastro-esophageal reflux disease without esophagitis: Secondary | ICD-10-CM

## 2014-01-16 DIAGNOSIS — I10 Essential (primary) hypertension: Secondary | ICD-10-CM

## 2014-01-16 DIAGNOSIS — R079 Chest pain, unspecified: Secondary | ICD-10-CM

## 2014-01-16 DIAGNOSIS — K279 Peptic ulcer, site unspecified, unspecified as acute or chronic, without hemorrhage or perforation: Secondary | ICD-10-CM

## 2014-01-16 DIAGNOSIS — E781 Pure hyperglyceridemia: Secondary | ICD-10-CM

## 2014-01-16 NOTE — Patient Instructions (Signed)
Continue all current medications.    Follow up in 2-3 months.

## 2014-01-16 NOTE — Progress Notes (Signed)
Patient ID: [name], female   DOB: 27-Nov-1950, 63 y.o.   MRN: [id number]      SUBJECTIVE: The patient is here to followup on the results of cardiovascular testing performed for the evaluation of chest pain. She recently saw gastroenterology and an EGD is being planned for tomorrow, as she's had recurring burning epigastric and chest discomfort. Echocardiography demonstrated vigorous left ventricular systolic function, EF 28-78%, with grade 1 diastolic dysfunction. Lexiscan Cardiolite demonstrated the following:  Low to intermediate risk abnormal Lexiscan Cardiolite. There were no  diagnostic ST segment abnormalities. Perfusion imaging shows  evidence of potential anterior wall ischemia in the LAD distribution  as outlined above, although breast attenuation is also evident and  may be contributing to this partially reversible zone. LVEF is  calculated at 86% with normal volumes and wall motion.  Of note, she had a left lumpectomy and has significant scar tissue in this region.  She is now swimming 3 times per week. She very seldom has an intermittent chest pinching sensation which lasts seconds and then spontaneously resolves.   Allergies  Allergen Reactions  . Imipramine Hives and Rash  . Tricor [Fenofibrate] Other (See Comments)    Pain, "aching"    Current Outpatient Prescriptions  Medication Sig Dispense Refill  . ALPRAZolam (XANAX XR) 1 MG 24 hr tablet Take 1 mg by mouth 2 (two) times daily.      [name] ALPRAZolam (XANAX) 0.5 MG tablet Take 0.25 mg by mouth daily as needed for anxiety.      [name] gemfibrozil (LOPID) 600 MG tablet Take 1 tablet (600 mg total) by mouth 2 (two) times daily.  180 tablet  1  . glucosamine-chondroitin 500-400 MG tablet Take 2 tablets by mouth daily.      [name] lamoTRIgine (LAMICTAL) 200 MG tablet Take 400 mg by mouth at bedtime.       [name] lisinopril (PRINIVIL,ZESTRIL) 5 MG tablet Take 1 tablet (5 mg total) by mouth daily.  30 tablet  2  . Multiple Vitamin  (MULTIVITAMIN WITH MINERALS) TABS tablet Take 1 tablet by mouth daily.      . pantoprazole (PROTONIX) 40 MG tablet Take 1 tablet (40 mg total) by mouth daily.  30 tablet  3  . QUEtiapine (SEROQUEL) 200 MG tablet Take 400 mg by mouth at bedtime.      [name] venlafaxine (EFFEXOR) 37.5 MG tablet Take 37.5 mg by mouth daily.      . Vitamin D, Ergocalciferol, (DRISDOL) 50000 UNITS CAPS capsule Take 50,000 Units by mouth every 7 (seven) days.       No current facility-administered medications for this visit.    Past Medical History  Diagnosis Date  . Bipolar 1 disorder   . Anxiety   . Cancer breast cancer 7 yrs ago  . High cholesterol   . Breast cancer, left breast 07/22/2011  . Depression   . OA (osteoarthritis)   . Obesity   . IBS (irritable bowel syndrome)   . Polycystic ovarian disease   . Sleep apnea   . Impaired fasting glucose     Past Surgical History  Procedure Laterality Date  . Total knee arthroplasty Bilateral right knee    2012, 2007  . Cholecystectomy    . Abdominal hysterectomy    . Breast lumpectomy Left 2008  . Appendectomy    . Colonoscopy N/A 09/13/2013    Procedure: COLONOSCOPY;  Surgeon: [name], MD;  Location: [facility];  Service: Endoscopy;  Laterality: N/A;  730    History   Social History  . Marital Status: Married    Spouse Name: N/A    Number of Children: N/A  . Years of Education: N/A   Occupational History  . Not on file.   Social History Main Topics  . Smoking status: Never Smoker   . Smokeless tobacco: Never Used  . Alcohol Use: No  . Drug Use: No  . Sexual Activity: No   Other Topics Concern  . Not on file   Social History Narrative   04/24/2013 [name] was born and grew up in [place]. She reports that her childhood was "tough." She has 2 older sisters and a younger brother. She achieved an Associates Degree in nursing. 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 She has  2 daughters and one son. She denies any legal difficulties. She affiliates as Engineer, manufacturing. Her hobbies include reading, sewing, crafts, and cooking. She reports that her social support system consists of her friend and passed her. 04/24/2013 [name]     Filed Vitals:   01/16/14 1134  BP: 112/72  Pulse: 73  Height: 5\' 4"  (1.626 m)  Weight: 300 lb (136.079 kg)  SpO2: 95%    PHYSICAL EXAM General: NAD Neck: No JVD, no thyromegaly. Lungs: Clear to auscultation bilaterally with normal respiratory effort. CV: Nondisplaced PMI.  Regular rate and rhythm, normal S1/S2, no S3/S4, no murmur. No pretibial or periankle edema.  No carotid bruit.  Normal pedal pulses.  Abdomen: Soft, nontender, no hepatosplenomegaly, no distention.  Neurologic: Alert and oriented x 3.  Psych: Normal affect. Extremities: No clubbing or cyanosis.   ECG: reviewed and available in electronic records.    ASSESSMENT AND PLAN: 1. Chest pain: Her symptoms have both typical and atypical features. She does have a history of hypertension, hypertriglyceridemia, and obesity. Nuclear MPI study results noted above. I will await results of EGD. If this is unremarkable, I would consider the initiation of nitrate therapy. Given her history of left lumpectomy with significant scar tissue in this region, this may have very well caused the defect seen on nuclear imaging. 2. HTN: Controlled on present medical therapy.   Dispo: f/u 2-3 months.  Kate Sable, M.D., F.A.C.C.

## 2014-01-17 ENCOUNTER — Ambulatory Visit (HOSPITAL_COMMUNITY)
Admission: RE | Admit: 2014-01-17 | Discharge: 2014-01-17 | Disposition: A | Discharge status: Home / Self Care | Payer: BC Managed Care – PPO | Source: Ambulatory Visit | Attending: Internal Medicine | Admitting: Internal Medicine

## 2014-01-17 ENCOUNTER — Encounter (HOSPITAL_COMMUNITY)
Admission: RE | Disposition: A | Discharge status: Home / Self Care | Payer: Self-pay | Source: Ambulatory Visit | Attending: Internal Medicine

## 2014-01-17 ENCOUNTER — Encounter (HOSPITAL_COMMUNITY): Payer: Self-pay | Admitting: *Deleted

## 2014-01-17 ENCOUNTER — Other Ambulatory Visit: Payer: Self-pay | Admitting: *Deleted

## 2014-01-17 DIAGNOSIS — F411 Generalized anxiety disorder: Secondary | ICD-10-CM | POA: Insufficient documentation

## 2014-01-17 DIAGNOSIS — K208 Other esophagitis without bleeding: Secondary | ICD-10-CM

## 2014-01-17 DIAGNOSIS — R079 Chest pain, unspecified: Secondary | ICD-10-CM

## 2014-01-17 DIAGNOSIS — E7800 Pure hypercholesterolemia, unspecified: Secondary | ICD-10-CM | POA: Insufficient documentation

## 2014-01-17 DIAGNOSIS — Z79899 Other long term (current) drug therapy: Secondary | ICD-10-CM | POA: Insufficient documentation

## 2014-01-17 DIAGNOSIS — E669 Obesity, unspecified: Secondary | ICD-10-CM | POA: Insufficient documentation

## 2014-01-17 DIAGNOSIS — K319 Disease of stomach and duodenum, unspecified: Secondary | ICD-10-CM | POA: Insufficient documentation

## 2014-01-17 DIAGNOSIS — Z853 Personal history of malignant neoplasm of breast: Secondary | ICD-10-CM | POA: Insufficient documentation

## 2014-01-17 DIAGNOSIS — R110 Nausea: Secondary | ICD-10-CM | POA: Insufficient documentation

## 2014-01-17 DIAGNOSIS — K2100 Gastro-esophageal reflux disease with esophagitis, without bleeding: Secondary | ICD-10-CM | POA: Insufficient documentation

## 2014-01-17 DIAGNOSIS — Z6841 Body Mass Index (BMI) 40.0 and over, adult: Secondary | ICD-10-CM | POA: Insufficient documentation

## 2014-01-17 DIAGNOSIS — K219 Gastro-esophageal reflux disease without esophagitis: Secondary | ICD-10-CM

## 2014-01-17 HISTORY — PX: ESOPHAGOGASTRODUODENOSCOPY: SHX5428

## 2014-01-17 SURGERY — EGD (ESOPHAGOGASTRODUODENOSCOPY)
Anesthesia: Moderate Sedation

## 2014-01-17 MED ORDER — MIDAZOLAM HCL 5 MG/5ML IJ SOLN
INTRAMUSCULAR | Status: DC | PRN
  Administered 2014-01-17: 3 mg via INTRAVENOUS
  Administered 2014-01-17: 2 mg via INTRAVENOUS
  Administered 2014-01-17: 3 mg via INTRAVENOUS
  Administered 2014-01-17: 2 mg via INTRAVENOUS

## 2014-01-17 MED ORDER — MEPERIDINE HCL 50 MG/ML IJ SOLN
INTRAMUSCULAR | Status: DC | PRN
  Administered 2014-01-17 (×2): 25 mg via INTRAVENOUS

## 2014-01-17 MED ORDER — MEPERIDINE HCL 50 MG/ML IJ SOLN
INTRAMUSCULAR | Status: AC
  Filled 2014-01-17: qty 1

## 2014-01-17 MED ORDER — MIDAZOLAM HCL 5 MG/5ML IJ SOLN
INTRAMUSCULAR | Status: AC
  Filled 2014-01-17: qty 10

## 2014-01-17 MED ORDER — SODIUM CHLORIDE 0.9 % IV SOLN
INTRAVENOUS | Status: DC
  Administered 2014-01-17: 1000 mL via INTRAVENOUS

## 2014-01-17 MED ORDER — LISINOPRIL 5 MG PO TABS: 5.0000 mg | ORAL_TABLET | Freq: Every day | ORAL | Status: DC

## 2014-01-17 NOTE — H&P (Addendum)
[name] is an 63 y.o. female.   Chief Complaint: Patient is here for EGD. 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 She has undergone non-cardiac evaluation which is negative. She was begun on Protonix by Dr. [name] and feels better. She denies dysphagia vomiting melena or rectal bleeding. When the symptoms started she did stop glucosamine but could not tell any difference. She also had abdominopelvic CT last month and no abnormality was noted.  Past Medical History  Diagnosis Date  . Bipolar 1 disorder   . Anxiety   . Cancer breast cancer 7 yrs ago  . High cholesterol   . Breast cancer, left breast 07/22/2011  . Depression   . OA (osteoarthritis)   . Obesity   . IBS (irritable bowel syndrome)   . Polycystic ovarian disease   . Sleep apnea   . Impaired fasting glucose     Past Surgical History  Procedure Laterality Date  . Total knee arthroplasty Bilateral right knee    2012, 2007  . Cholecystectomy    . Abdominal hysterectomy    . Breast lumpectomy Left 2008  . Appendectomy    . Colonoscopy N/A 09/13/2013    Procedure: COLONOSCOPY;  Surgeon: [name], MD;  Location: [facility];  Service: Endoscopy;  Laterality: N/A;  730    Family History  Problem Relation Age of Onset  . Bipolar disorder Mother   . Diabetes Mother   . Bipolar disorder Sister   . Diabetes Sister   . Alcohol abuse Father   . Hypertension Father   . Heart disease Other    Social History:  reports that she has never smoked. She has never used smokeless tobacco. She reports that she does not drink alcohol or use illicit drugs.  Allergies:  Allergies  Allergen Reactions  . Imipramine Hives and Rash  . Tricor [Fenofibrate] Other (See Comments)    Pain, "aching"    Medications Prior to Admission  Medication Sig Dispense Refill  . ALPRAZolam  (XANAX XR) 1 MG 24 hr tablet Take 1 mg by mouth 2 (two) times daily.      [name] ALPRAZolam (XANAX) 0.5 MG tablet Take 0.25 mg by mouth daily as needed for anxiety.      [name] gemfibrozil (LOPID) 600 MG tablet Take 1 tablet (600 mg total) by mouth 2 (two) times daily.  180 tablet  1  . glucosamine-chondroitin 500-400 MG tablet Take 2 tablets by mouth daily.      [name] lamoTRIgine (LAMICTAL) 200 MG tablet Take 400 mg by mouth at bedtime.       [name] lisinopril (PRINIVIL,ZESTRIL) 5 MG tablet Take 1 tablet (5 mg total) by mouth daily.  90 tablet  1  . Multiple Vitamin (MULTIVITAMIN WITH MINERALS) TABS tablet Take 1 tablet by mouth daily.      . pantoprazole (PROTONIX) 40 MG tablet Take 1 tablet (40 mg total) by mouth daily.  30 tablet  3  . QUEtiapine (SEROQUEL) 200 MG tablet Take 400 mg by mouth at bedtime.      [name] venlafaxine (EFFEXOR) 37.5 MG tablet Take 37.5 mg by mouth daily.      . Vitamin D, Ergocalciferol, (DRISDOL) 50000 UNITS CAPS capsule Take 50,000 Units by mouth every 7 (seven) days.        No results found for this or any previous visit (  from the past 48 hour(s)). No results found.  ROS  Blood pressure 146/75, pulse 78, temperature 97.7 F (36.5 C), temperature source Oral, resp. rate 16, height 5\' 4"  (1.626 m), weight 300 lb (136.079 kg), SpO2 96.00%. Physical Exam  Constitutional: She appears well-developed and well-nourished.  HENT:  Mouth/Throat: Oropharynx is clear and moist.  Eyes: Conjunctivae are normal. No scleral icterus.  Neck: No thyromegaly present.  Cardiovascular: Normal rate, regular rhythm and normal heart sounds.   No murmur heard. GI: Soft. She exhibits no distension and no mass. Tenderness: mild midepigastric tenderness.  Musculoskeletal: She exhibits no edema.  Lymphadenopathy:    She has no cervical adenopathy.  Neurological: She is alert.  Skin: Skin is warm and dry.     Assessment/Plan Noncardiac chest pain. Diagnostic EGD.  REHMAN,NAJEEB U 01/17/2014, 2:26  PM

## 2014-01-17 NOTE — Op Note (Signed)
EGD PROCEDURE REPORT  PATIENT:  [name]  MR#:  [id number] Birthdate:  [birth year], 63 y.o., female Endoscopist:  Dr. [name], MD Referred By:  Dr. 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 Noninvasive cardiac evaluation is negative. She she has noticed some improvement with pantoprazole she takes at bedtime. She is undergoing diagnostic EGD.            Informed Consent:  The risks, benefits, alternatives & imponderables which include, but are not limited to, bleeding, infection, perforation, drug reaction and potential missed lesion have been reviewed.  The potential for biopsy, lesion removal, esophageal dilation, etc. have also been discussed.  Questions have been answered.  All parties agreeable.  Please see history & physical in medical record for more information.  Medications:  Demerol 50 mg IV Versed 10 mg IV Cetacaine spray topically for oropharyngeal anesthesia  Description of procedure:  The endoscope was introduced through the mouth and advanced to the second portion of the duodenum without difficulty or limitations. The mucosal surfaces were surveyed very carefully during advancement of the scope and upon withdrawal.  Findings:  Esophagus:  Mucosa of the esophagus was normal. Focal edema and erythema noted at GE junction. GEJ:  39 cm Stomach:  Moderate amount of bile noted in the stomach.  Stomach distended very well with insufflation. Folds in the proximal stomach were normal. Examination of  mucosa at body, antrum, angularis fundus and cardia was normal. Pyloric channel was patent. There were few tiny patches of whitish mucosa in the prepyloric region with features typical of intestinal metaplasia. Duodenum:  Normal bulbar and post bulbar mucosa.  Therapeutic/Diagnostic Maneuvers Performed:  None  Complications:   None  Impression: Mild changes of reflux esophagitis limited to GE junction. Duodenogastric bile reflux. Focal changes of intestinal metaplasia in prepyloric region.  Recommendations:  H. Pylori serology. Patient advised to take pantoprazole before breakfast her evening meal rather than bedtime. Patient will keep symptom diary and to office visit in 2 months.  Cece Milhouse U  01/17/2014  2:51 PM  CC: Dr. [name], MD & Dr. Rayne Du ref. provider found

## 2014-01-17 NOTE — Discharge Instructions (Signed)
Resume usual medications and diet. Take pantoprazole 30 minutes before breakfast her evening meals daily rather than at bedtime. No driving for 24 hours. Symptom diary until office visit in 2 months.  Gastrointestinal Endoscopy Care After Refer to this sheet in the next few weeks. These instructions provide you with information on caring for yourself after your procedure. Your caregiver may also give you more specific instructions. Your treatment has been planned according to current medical practices, but problems sometimes occur. Call your caregiver if you have any problems or questions after your procedure. HOME CARE INSTRUCTIONS  If you were given medicine to help you relax (sedative), do not drive, operate machinery, or sign important documents for 24 hours.  Avoid alcohol and hot or warm beverages for the first 24 hours after the procedure.  Only take over-the-counter or prescription medicines for pain, discomfort, or fever as directed by your caregiver. You may resume taking your normal medicines unless your caregiver tells you otherwise. Ask your caregiver when you may resume taking medicines that may cause bleeding, such as aspirin, clopidogrel, or warfarin.  You may return to your normal diet and activities on the day after your procedure, or as directed by your caregiver. Walking may help to reduce any bloated feeling in your abdomen.  Drink enough fluids to keep your urine clear or pale yellow.  You may gargle with salt water if you have a sore throat. SEEK IMMEDIATE MEDICAL CARE IF:  You have severe nausea or vomiting.  You have severe abdominal pain, abdominal cramps that last longer than 6 hours, or abdominal swelling (distention).  You have severe shoulder or back pain.  You have trouble swallowing.  You have shortness of breath, your breathing is shallow, or you are breathing faster than normal.  You have a fever or a rapid heartbeat.  You vomit blood or material  that looks like coffee grounds.  You have bloody, black, or tarry stools. MAKE SURE YOU:  Understand these instructions.  Will watch your condition.  Will get help right away if you are not doing well or get worse. Document Released: 02/10/2004 Document Revised: 12/28/2011 Document Reviewed: 09/28/2011 [facility] Patient Information 2015 [place], [place]. This information is not intended to replace advice given to you by your health care provider. Make sure you discuss any questions you have with your health care provider.

## 2014-01-18 LAB — H. PYLORI ANTIBODY, IGG: H PYLORI IGG: 0.49 {ISR}

## 2014-01-21 ENCOUNTER — Encounter (HOSPITAL_COMMUNITY): Payer: Self-pay | Admitting: Internal Medicine

## 2014-03-05 DIAGNOSIS — Z0289 Encounter for other administrative examinations: Secondary | ICD-10-CM

## 2014-03-08 ENCOUNTER — Ambulatory Visit (HOSPITAL_COMMUNITY)
Admission: RE | Admit: 2014-03-08 | Discharge: 2014-03-08 | Disposition: A | Discharge status: Home / Self Care | Payer: BC Managed Care – PPO | Source: Ambulatory Visit | Attending: Specialist | Admitting: Specialist

## 2014-03-08 ENCOUNTER — Other Ambulatory Visit (HOSPITAL_COMMUNITY): Payer: Self-pay | Admitting: Specialist

## 2014-03-08 DIAGNOSIS — R609 Edema, unspecified: Secondary | ICD-10-CM | POA: Diagnosis not present

## 2014-03-08 DIAGNOSIS — M79662 Pain in left lower leg: Secondary | ICD-10-CM

## 2014-03-08 DIAGNOSIS — M79609 Pain in unspecified limb: Secondary | ICD-10-CM | POA: Diagnosis not present

## 2014-03-08 DIAGNOSIS — M7989 Other specified soft tissue disorders: Secondary | ICD-10-CM

## 2014-03-12 ENCOUNTER — Other Ambulatory Visit: Payer: Self-pay | Admitting: *Deleted

## 2014-03-12 MED ORDER — PANTOPRAZOLE SODIUM 40 MG PO TBEC: 40.0000 mg | DELAYED_RELEASE_TABLET | Freq: Every day | ORAL | Status: DC

## 2014-03-13 ENCOUNTER — Other Ambulatory Visit: Payer: Self-pay | Admitting: *Deleted

## 2014-03-13 MED ORDER — PANTOPRAZOLE SODIUM 40 MG PO TBEC: 40.0000 mg | DELAYED_RELEASE_TABLET | Freq: Every day | ORAL | Status: DC

## 2014-03-14 ENCOUNTER — Other Ambulatory Visit (HOSPITAL_COMMUNITY): Payer: Self-pay | Admitting: Family Medicine

## 2014-03-14 ENCOUNTER — Ambulatory Visit (HOSPITAL_COMMUNITY)
Admission: RE | Admit: 2014-03-14 | Discharge: 2014-03-14 | Disposition: A | Discharge status: Home / Self Care | Payer: Disability Insurance | Source: Ambulatory Visit | Attending: Family Medicine | Admitting: Family Medicine

## 2014-03-14 DIAGNOSIS — M545 Low back pain, unspecified: Secondary | ICD-10-CM | POA: Diagnosis present

## 2014-03-14 DIAGNOSIS — M25561 Pain in right knee: Secondary | ICD-10-CM

## 2014-03-14 DIAGNOSIS — M25562 Pain in left knee: Secondary | ICD-10-CM

## 2014-03-14 DIAGNOSIS — M25569 Pain in unspecified knee: Secondary | ICD-10-CM | POA: Diagnosis not present

## 2014-03-19 ENCOUNTER — Encounter: Payer: Self-pay | Admitting: Cardiovascular Disease

## 2014-03-19 ENCOUNTER — Other Ambulatory Visit: Payer: Self-pay | Admitting: Family Medicine

## 2014-03-19 ENCOUNTER — Ambulatory Visit (INDEPENDENT_AMBULATORY_CARE_PROVIDER_SITE_OTHER): Payer: BC Managed Care – PPO | Admitting: Cardiovascular Disease

## 2014-03-19 VITALS — BP 132/83 | HR 73 | Ht 64.0 in | Wt 295.0 lb

## 2014-03-19 DIAGNOSIS — K2100 Gastro-esophageal reflux disease with esophagitis, without bleeding: Secondary | ICD-10-CM

## 2014-03-19 DIAGNOSIS — E781 Pure hyperglyceridemia: Secondary | ICD-10-CM

## 2014-03-19 DIAGNOSIS — I10 Essential (primary) hypertension: Secondary | ICD-10-CM

## 2014-03-19 DIAGNOSIS — R002 Palpitations: Secondary | ICD-10-CM

## 2014-03-19 DIAGNOSIS — R079 Chest pain, unspecified: Secondary | ICD-10-CM

## 2014-03-19 NOTE — Patient Instructions (Signed)
There were no changes to your medications. Continue as directed. Your physician wants you to follow up in: 6 months.  You will receive a reminder letter in the mail one-two months in advance.  If you don't receive a letter, please call our office to schedule the follow up appointment.

## 2014-03-19 NOTE — Progress Notes (Signed)
Patient ID: KARTHIKA GLASPER, female   DOB: [birth year], 63 y.o.   MRN: [id number]      SUBJECTIVE: The patient presents for followup of chest pain. EGD showed changes consistent with mild reflux esophagitis. In summary, CV testing revealed the following: Echocardiography demonstrated vigorous left ventricular systolic function, EF 65-46%, with grade 1 diastolic dysfunction.   Lexiscan Cardiolite demonstrated the following:  Low to intermediate risk abnormal Lexiscan Cardiolite. There were no  diagnostic ST segment abnormalities. Perfusion imaging shows  evidence of potential anterior wall ischemia in the LAD distribution  as outlined above, although breast attenuation is also evident and  may be contributing to this partially reversible zone. LVEF is  calculated at 86% with normal volumes and wall motion.   Of note, she had a left lumpectomy and has significant scar tissue in this region.  She denies any recurrence of chest pain. She has had palpitations in the past 3 days and felt slightly dizzy earlier this morning, says that everything is now normal. She does say that her nerves are quite agitated and feels anxious.   Review of Systems: As per "subjective", otherwise negative.  Allergies  Allergen Reactions  . Imipramine Hives and Rash  . Tricor [Fenofibrate] Other (See Comments)    Pain, "aching"    Current Outpatient Prescriptions  Medication Sig Dispense Refill  . ALPRAZolam (XANAX XR) 1 MG 24 hr tablet Take 1 mg by mouth 2 (two) times daily.      [name] ALPRAZolam (XANAX) 0.5 MG tablet Take 0.25 mg by mouth daily as needed for anxiety.      [name] gemfibrozil (LOPID) 600 MG tablet TAKE 1 TABLET (600 MG TOTAL) BY MOUTH 2 (TWO) TIMES DAILY.  180 tablet  1  . glucosamine-chondroitin 500-400 MG tablet Take 2 tablets by mouth daily.      [name] lamoTRIgine (LAMICTAL) 200 MG tablet Take 400 mg by mouth at bedtime.       [name] lisinopril (PRINIVIL,ZESTRIL) 5 MG tablet Take 1 tablet (5 mg total) by  mouth daily.  90 tablet  1  . Multiple Vitamin (MULTIVITAMIN WITH MINERALS) TABS tablet Take 1 tablet by mouth daily.      . pantoprazole (PROTONIX) 40 MG tablet Take 1 tablet (40 mg total) by mouth daily.  90 tablet  1  . QUEtiapine (SEROQUEL) 200 MG tablet Take 400 mg by mouth at bedtime.      [name] venlafaxine (EFFEXOR) 37.5 MG tablet Take 37.5 mg by mouth daily.      . Vitamin D, Ergocalciferol, (DRISDOL) 50000 UNITS CAPS capsule Take 50,000 Units by mouth every 7 (seven) days.       No current facility-administered medications for this visit.    Past Medical History  Diagnosis Date  . Bipolar 1 disorder   . Anxiety   . Cancer breast cancer 7 yrs ago  . High cholesterol   . Breast cancer, left breast 07/22/2011  . Depression   . OA (osteoarthritis)   . Obesity   . IBS (irritable bowel syndrome)   . Polycystic ovarian disease   . Sleep apnea   . Impaired fasting glucose     Past Surgical History  Procedure Laterality Date  . Total knee arthroplasty Bilateral right knee    2012, 2007  . Cholecystectomy    . Abdominal hysterectomy    . Breast lumpectomy Left 2008  . Appendectomy    . Colonoscopy N/A 09/13/2013    Procedure: COLONOSCOPY;  Surgeon: [name], MD;  [facility];  Service: Endoscopy;  Laterality: N/A;  730  . Esophagogastroduodenoscopy N/A 01/17/2014    Procedure: ESOPHAGOGASTRODUODENOSCOPY (EGD);  Surgeon: [name], MD;  Location: [facility];  Service: Endoscopy;  Laterality: N/A;  245    History   Social History  . Marital Status: Married    Spouse Name: N/A    Number of Children: N/A  . Years of Education: N/A   Occupational History  . Not on file.   Social History Main Topics  . Smoking status: Never Smoker   . Smokeless tobacco: Never Used  . Alcohol Use: No  . Drug Use: No  . Sexual Activity: No   Other Topics Concern  . Not on file   Social History Narrative   04/24/2013 [name] was born and grew up in [place]. She reports that her childhood was "tough." She has 2 older sisters and a younger brother. She achieved an Associates Degree in nursing. 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 She has 2 daughters and one son. She denies any legal difficulties. She affiliates as Engineer, manufacturing. Her hobbies include reading, sewing, crafts, and cooking. She reports that her social support system consists of her friend and passed her. 04/24/2013 [name]     Filed Vitals:   03/19/14 1449  Weight: 295 lb (133.811 kg)   BP 132/83  Pulse 73   PHYSICAL EXAM General: NAD, obese HEENT: Normal. Neck: No JVD, no thyromegaly. Lungs: Clear to auscultation bilaterally with normal respiratory effort. CV: Nondisplaced PMI.  Regular rate and rhythm, normal S1/S2, no S3/S4, no murmur. No pretibial or periankle edema.  No carotid bruit.  Normal pedal pulses.  Abdomen: Soft, nontender, no hepatosplenomegaly, no distention.  Neurologic: Alert and oriented x 3.  Psych: Normal affect. Skin: Normal. Musculoskeletal: Normal range of motion, no gross deformities. Extremities: No clubbing or cyanosis.   ECG: Most recent ECG reviewed.      ASSESSMENT AND PLAN: 1. Chest pain: No recurrence since last visit. Previously described symptoms have had both typical and atypical features. She does have a history of hypertension, hypertriglyceridemia, and obesity. Nuclear MPI study results noted above. If there is a recurrence of symptoms, I would consider the initiation of nitrate therapy. Given her history of left lumpectomy with significant scar tissue in this region, this may have very well caused the defect seen on nuclear imaging.  2. Essential HTN: Controlled on present medical therapy.  3. Palpitations: Possibly a function of anxiety. If she were to have frequent recurrences, I would consider an event monitor to evaluate for arrhythmias. 4. Anxiety: On Xanax, Seroquel, and  Effexor. 5. GERD/mild esophagitis: On Protonix 40 mg daily.  Dispo: f/u 6 months.  Kate Sable, M.D., F.A.C.C.

## 2014-03-21 ENCOUNTER — Ambulatory Visit: Payer: Self-pay | Admitting: Cardiovascular Disease

## 2014-03-23 ENCOUNTER — Encounter (HOSPITAL_COMMUNITY): Payer: Self-pay | Admitting: Emergency Medicine

## 2014-03-23 ENCOUNTER — Emergency Department (HOSPITAL_COMMUNITY)
Admission: EM | Admit: 2014-03-23 | Discharge: 2014-03-24 | Disposition: A | Discharge status: Home / Self Care | Payer: BC Managed Care – PPO | Attending: Emergency Medicine | Admitting: Emergency Medicine

## 2014-03-23 DIAGNOSIS — I498 Other specified cardiac arrhythmias: Secondary | ICD-10-CM | POA: Insufficient documentation

## 2014-03-23 DIAGNOSIS — K589 Irritable bowel syndrome without diarrhea: Secondary | ICD-10-CM | POA: Diagnosis not present

## 2014-03-23 DIAGNOSIS — E669 Obesity, unspecified: Secondary | ICD-10-CM | POA: Diagnosis not present

## 2014-03-23 DIAGNOSIS — R5383 Other fatigue: Secondary | ICD-10-CM

## 2014-03-23 DIAGNOSIS — Z79899 Other long term (current) drug therapy: Secondary | ICD-10-CM | POA: Insufficient documentation

## 2014-03-23 DIAGNOSIS — F319 Bipolar disorder, unspecified: Secondary | ICD-10-CM | POA: Diagnosis not present

## 2014-03-23 DIAGNOSIS — Z853 Personal history of malignant neoplasm of breast: Secondary | ICD-10-CM | POA: Diagnosis not present

## 2014-03-23 DIAGNOSIS — F411 Generalized anxiety disorder: Secondary | ICD-10-CM | POA: Insufficient documentation

## 2014-03-23 DIAGNOSIS — E7800 Pure hypercholesterolemia, unspecified: Secondary | ICD-10-CM | POA: Diagnosis not present

## 2014-03-23 DIAGNOSIS — R001 Bradycardia, unspecified: Secondary | ICD-10-CM

## 2014-03-23 DIAGNOSIS — R110 Nausea: Secondary | ICD-10-CM | POA: Insufficient documentation

## 2014-03-23 DIAGNOSIS — R5381 Other malaise: Secondary | ICD-10-CM | POA: Insufficient documentation

## 2014-03-23 DIAGNOSIS — Z8739 Personal history of other diseases of the musculoskeletal system and connective tissue: Secondary | ICD-10-CM | POA: Diagnosis not present

## 2014-03-23 LAB — COMPREHENSIVE METABOLIC PANEL
ALBUMIN: 3.6 g/dL (ref 3.5–5.2)
ALK PHOS: 110 U/L (ref 39–117)
ALT: 20 U/L (ref 0–35)
ANION GAP: 13 (ref 5–15)
AST: 22 U/L (ref 0–37)
BUN: 21 mg/dL (ref 6–23)
CHLORIDE: 101 meq/L (ref 96–112)
CO2: 26 mEq/L (ref 19–32)
Calcium: 9 mg/dL (ref 8.4–10.5)
Creatinine, Ser: 0.76 mg/dL (ref 0.50–1.10)
GFR calc Af Amer: >90 mL/min (ref >90)
GFR calc non Af Amer: 89 mL/min — ABNORMAL LOW (ref >90)
Glucose, Bld: 128 mg/dL — ABNORMAL HIGH (ref 70–99)
POTASSIUM: 4.3 meq/L (ref 3.7–5.3)
SODIUM: 140 meq/L (ref 137–147)
Total Bilirubin: 0.3 mg/dL (ref 0.3–1.2)
Total Protein: 6.9 g/dL (ref 6.0–8.3)

## 2014-03-23 LAB — CBC WITH DIFFERENTIAL/PLATELET
BASOS PCT: 0 % (ref 0–1)
Basophils Absolute: 0 10*3/uL (ref 0.0–0.1)
EOS ABS: 0.1 10*3/uL (ref 0.0–0.7)
Eosinophils Relative: 2 % (ref 0–5)
HCT: 39.1 % (ref 36.0–46.0)
HEMOGLOBIN: 12.8 g/dL (ref 12.0–15.0)
Lymphocytes Relative: 27 % (ref 12–46)
Lymphs Abs: 1.7 10*3/uL (ref 0.7–4.0)
MCH: 30.3 pg (ref 26.0–34.0)
MCHC: 32.7 g/dL (ref 30.0–36.0)
MCV: 92.7 fL (ref 78.0–100.0)
MONOS PCT: 6 % (ref 3–12)
Monocytes Absolute: 0.4 10*3/uL (ref 0.1–1.0)
NEUTROS ABS: 4.2 10*3/uL (ref 1.7–7.7)
NEUTROS PCT: 65 % (ref 43–77)
PLATELETS: 249 10*3/uL (ref 150–400)
RBC: 4.22 MIL/uL (ref 3.87–5.11)
RDW: 14.2 % (ref 11.5–15.5)
WBC: 6.4 10*3/uL (ref 4.0–10.5)

## 2014-03-23 LAB — I-STAT TROPONIN, ED
TROPONIN I, POC: 0 ng/mL (ref 0.00–0.08)
Troponin i, poc: 0 ng/mL (ref 0.00–0.08)

## 2014-03-23 MED ORDER — SODIUM CHLORIDE 0.9 % IV BOLUS (SEPSIS)
1000.0000 mL | Freq: Once | INTRAVENOUS | Status: AC
  Administered 2014-03-23: 1000 mL via INTRAVENOUS

## 2014-03-23 NOTE — ED Provider Notes (Signed)
CSN: [id number]     Arrival date & time 03/23/14  1839 History   First MD Initiated Contact with Patient 03/23/14 1900     Chief Complaint  Patient presents with  . Bradycardia  . Nausea  . Fatigue     (Consider location/radiation/quality/duration/timing/severity/associated sxs/prior Treatment) Patient is a 63 y.o. female presenting with general illness.  Illness Location:  Generalized Quality:  Malaise, nausea, near syncope Severity:  Moderate Onset quality:  Gradual Duration:  1 week Timing:  Constant Progression:  Waxing and waning Chronicity:  New Context:  Found to be bradycardic via EMS Relieved by:  Hit to face by EMS Worsened by:  Nothing Associated symptoms: fatigue and nausea   Associated symptoms: no abdominal pain, no chest pain, no congestion, no cough, no diarrhea, no fever, no loss of consciousness, no rash, no rhinorrhea, no shortness of breath, no sore throat and no vomiting     Past Medical History  Diagnosis Date  . Bipolar 1 disorder   . Anxiety   . Cancer breast cancer 7 yrs ago  . High cholesterol   . Breast cancer, left breast 07/22/2011  . Depression   . OA (osteoarthritis)   . Obesity   . IBS (irritable bowel syndrome)   . Polycystic ovarian disease   . Sleep apnea   . Impaired fasting glucose    Past Surgical History  Procedure Laterality Date  . Total knee arthroplasty Bilateral right knee    2012, 2007  . Cholecystectomy    . Abdominal hysterectomy    . Breast lumpectomy Left 2008  . Appendectomy    . Colonoscopy N/A 09/13/2013    Procedure: COLONOSCOPY;  Surgeon: [name], MD;  [facility];  Service: Endoscopy;  Laterality: N/A;  730  . Esophagogastroduodenoscopy N/A 01/17/2014    Procedure: ESOPHAGOGASTRODUODENOSCOPY (EGD);  Surgeon: [name], MD;  [facility];  Service: Endoscopy;  Laterality: N/A;  245   Family History  Problem Relation Age of Onset  . Bipolar disorder Mother   . Diabetes  Mother   . Bipolar disorder Sister   . Diabetes Sister   . Alcohol abuse Father   . Hypertension Father   . Heart disease Other    History  Substance Use Topics  . Smoking status: Never Smoker   . Smokeless tobacco: Never Used  . Alcohol Use: No   OB History   Grav Para Term Preterm Abortions TAB SAB Ect Mult Living                 Review of Systems  Constitutional: Positive for fatigue. Negative for fever and chills.  HENT: Negative for congestion, rhinorrhea and sore throat.   Eyes: Negative for photophobia and visual disturbance.  Respiratory: Negative for cough and shortness of breath.   Cardiovascular: Negative for chest pain and leg swelling.  Gastrointestinal: Positive for nausea. Negative for vomiting, abdominal pain, diarrhea and constipation.  Endocrine: Negative for polyphagia and polyuria.  Genitourinary: Negative for dysuria, flank pain, vaginal bleeding, vaginal discharge and enuresis.  Musculoskeletal: Negative for back pain and gait problem.  Skin: Negative for color change and rash.  Neurological: Negative for dizziness, loss of consciousness, syncope, light-headedness and numbness.  Hematological: Negative for adenopathy. Does not bruise/bleed easily.  All other systems reviewed and are negative.     Allergies  Imipramine and Tricor  Home Medications   Prior to Admission medications   Medication Sig Start Date End Date Taking? Authorizing Provider  ALPRAZolam (XANAX XR) 1 MG 24 hr tablet Take 1 mg by mouth 2 (two) times daily.   Yes Historical Provider, MD  ALPRAZolam [name]) 0.5 MG tablet Take 0.5 mg by mouth daily as needed for anxiety.    Yes Historical Provider, MD  gemfibrozil (LOPID) 600 MG tablet Take 600 mg by mouth 2 (two) times daily.   Yes Historical Provider, MD  glucosamine-chondroitin 500-400 MG tablet Take 2 tablets by mouth daily.   Yes Historical Provider, MD  lamoTRIgine (LAMICTAL) 200 MG tablet Take 400 mg by mouth at bedtime.    Yes  Historical Provider, MD  lisinopril (PRINIVIL,ZESTRIL) 5 MG tablet Take 1 tablet (5 mg total) by mouth daily. 01/17/14  Yes Kathyrn Drown, MD  Multiple Vitamin (MULTIVITAMIN WITH MINERALS) TABS tablet Take 1 tablet by mouth daily.   Yes Historical Provider, MD  pantoprazole (PROTONIX) 40 MG tablet Take 1 tablet (40 mg total) by mouth daily. 03/13/14  Yes Kathyrn Drown, MD  QUEtiapine (SEROQUEL) 200 MG tablet Take 400 mg by mouth at bedtime. 05/07/13  Yes [name], MD  venlafaxine (EFFEXOR) 37.5 MG tablet Take 37.5 mg by mouth daily.   Yes Historical Provider, MD  Vitamin D, Ergocalciferol, (DRISDOL) 50000 UNITS CAPS capsule Take 50,000 Units by mouth every 7 (seven) days.   Yes Historical Provider, MD   BP 132/87  Pulse 70  Resp 19  Ht 5\' 4"  (1.626 m)  Wt 295 lb (133.811 kg)  BMI 50.61 kg/m2  SpO2 93% Physical Exam  Vitals reviewed. Constitutional: She is oriented to person, place, and time. She appears well-developed and well-nourished.  HENT:  Head: Normocephalic.  Right Ear: External ear normal.  Left Ear: External ear normal.  Small area of erythema over central upper lip  Eyes: Conjunctivae and EOM are normal. Pupils are equal, round, and reactive to light.  Neck: Normal range of motion. Neck supple.  Cardiovascular: Normal rate, regular rhythm, normal heart sounds and intact distal pulses.   Pulmonary/Chest: Effort normal and breath sounds normal.  Abdominal: Soft. Bowel sounds are normal. There is no tenderness.  Musculoskeletal: Normal range of motion.  Neurological: She is alert and oriented to person, place, and time.  Skin: Skin is warm and dry.    ED Course  Procedures (including critical care time) Labs Review Labs Reviewed  COMPREHENSIVE METABOLIC PANEL - Abnormal; Notable for the following:    Glucose, Bld 128 (*)    GFR calc non Af Amer 89 (*)    All other components within normal limits  CBC WITH DIFFERENTIAL  I-STAT TROPOININ, ED  I-STAT  TROPOININ, ED    Imaging Review No results found.   EKG Interpretation   Date/Time:  Saturday March 23 2014 18:47:59 EDT Ventricular Rate:  77 PR Interval:  188 QRS Duration: 90 QT Interval:  401 QTC Calculation: 454 R Axis:   -18 Text Interpretation:  Sinus rhythm Borderline left axis deviation Baseline  wander in lead(s) V6 Confirmed by Debby Freiberg [phone]) on 03/23/2014  9:10:48 PM      MDM   Final diagnoses:  None    63 y.o. female  with pertinent PMH of prior breast ca, PCOS, bipolar do presents with near syncope in context of bradycardia noted via EMS.  Pt was placed on monitor and had both a sinus bradycardia and bradycardia without evident p waves with symptoms, however a cabinet door fell off the ambulance and hit the pt in the face, and her rhythm and symptoms  started.  On arrival vitals and physical exam as above.  No signs of acute bony injury to face, and pt had persistent normal rhythm.  No chest pain or signs of 3rd degree block.  Consulted cardiology.    Labs and imaging as above reviewed.   1. Bradycardia         Debby Freiberg, MD 03/24/14 1212

## 2014-03-23 NOTE — ED Notes (Signed)
Patient requested food to eat. Called Dr. Colin Rhein to discuss plan of care.  Patient is to receive serial troponins, and cardiology will come to see her.  Patient not allowed to eat at this time. Updated patient on the plan of care.

## 2014-03-23 NOTE — ED Notes (Signed)
Pt states she was not feeling well this morning.  Pt went downtown to [employer] and felt worse.  EMS called out and reports EKG changing from SR at 76 bpm to Junctional at 40 bpm.  Denies any pain.

## 2014-03-24 DIAGNOSIS — I10 Essential (primary) hypertension: Secondary | ICD-10-CM

## 2014-03-24 DIAGNOSIS — I498 Other specified cardiac arrhythmias: Secondary | ICD-10-CM

## 2014-03-24 NOTE — Consult Note (Signed)
CARDIOLOGY CONSULT NOTE  Patient ID: [name], MRN: [id number], DOB/AGE: Apr 05, 1951 63 y.o. Admit date: 03/23/2014 Date of Consult: 03/24/2014  Primary Physician: [name], MD Primary Cardiologist: Bronson Ing  Chief Complaint: malaise, feeling unwell Reason for Consultation: possible bradyarrhythmia  HPI: 63 y.o. female w/ PMHx significant for anxiety, bipolar disorder, HTN who presented to [facility] on 03/24/2014 with complaints of malaise and feeling unwell.   She reports that while at the folk festival today, she walked a significant distance to [employer] and during which felt unwell. Sat down and felt nauseated and lightheaded. Sought care at the paramedic tent and was found to be in sinus rhythm until a brief episode of junctional escape terminated when a piece of equipment in the ambulance hit her and sinus returned.  She actually just saw her cadiologist earlier this week and mentioned to him about palpitations and dizziness which resolved. She has previously undergone echo and lexiscan which was low to intermediate due to breast attenuation vs. Ischemia.   She currently feels back to baseline and feels well. She denies ever having a syncopal event. She swims regularly and reports that her knees are the limiting factor, not any cardiac symptoms.   No new medications recently.  Has been up and walking around the ER to bathroom without difficulty or symptoms.   Past Medical History  Diagnosis Date  . Bipolar 1 disorder   . Anxiety   . Cancer breast cancer 7 yrs ago  . High cholesterol   . Breast cancer, left breast 07/22/2011  . Depression   . OA (osteoarthritis)   . Obesity   . IBS (irritable bowel syndrome)   . Polycystic ovarian disease   . Sleep apnea   . Impaired fasting glucose       Surgical History:  Past Surgical History  Procedure Laterality Date  . Total knee arthroplasty Bilateral right knee    2012, 2007  . Cholecystectomy    .  Abdominal hysterectomy    . Breast lumpectomy Left 2008  . Appendectomy    . Colonoscopy N/A 09/13/2013    Procedure: COLONOSCOPY;  Surgeon: [name], MD;  [facility];  Service: Endoscopy;  Laterality: N/A;  730  . Esophagogastroduodenoscopy N/A 01/17/2014    Procedure: ESOPHAGOGASTRODUODENOSCOPY (EGD);  Surgeon: [name], MD;  Location: [facility];  Service: Endoscopy;  Laterality: N/A;  245     Home Meds: Prior to Admission medications   Medication Sig Start Date End Date Taking? Authorizing Provider  ALPRAZolam (XANAX XR) 1 MG 24 hr tablet Take 1 mg by mouth 2 (two) times daily.   Yes Historical Provider, MD  ALPRAZolam [name]) 0.5 MG tablet Take 0.25 mg by mouth daily as needed for anxiety.   Yes Historical Provider, MD  gemfibrozil (LOPID) 600 MG tablet Take 600 mg by mouth 2 (two) times daily.   Yes Historical Provider, MD  glucosamine-chondroitin 500-400 MG tablet Take 2 tablets by mouth daily.   Yes Historical Provider, MD  lamoTRIgine (LAMICTAL) 200 MG tablet Take 400 mg by mouth at bedtime.    Yes Historical Provider, MD  lisinopril (PRINIVIL,ZESTRIL) 5 MG tablet Take 1 tablet (5 mg total) by mouth daily. 01/17/14  Yes Kathyrn Drown, MD  Multiple Vitamin (MULTIVITAMIN WITH MINERALS) TABS tablet Take 1 tablet by mouth daily.   Yes Historical Provider, MD  pantoprazole (PROTONIX) 40 MG tablet Take 1 tablet (40 mg total) by mouth daily. 03/13/14  Yes Scott A  Luking, MD  QUEtiapine (SEROQUEL) 200 MG tablet Take 400 mg by mouth at bedtime. 05/07/13  Yes [name], MD  venlafaxine (EFFEXOR) 37.5 MG tablet Take 37.5 mg by mouth daily.   Yes Historical Provider, MD  Vitamin D, Ergocalciferol, (DRISDOL) 50000 UNITS CAPS capsule Take 50,000 Units by mouth every 7 (seven) days.   Yes Historical Provider, MD    Inpatient Medications:    Allergies:  Allergies  Allergen Reactions  . Imipramine Hives and Rash  . Tricor [Fenofibrate] Other (See Comments)     Pain, "aching"    History   Social History  . Marital Status: Married    Spouse Name: N/A    Number of Children: N/A  . Years of Education: N/A   Occupational History  . Not on file.   Social History Main Topics  . Smoking status: Never Smoker   . Smokeless tobacco: Never Used  . Alcohol Use: No  . Drug Use: No  . Sexual Activity: No   Other Topics Concern  . Not on file   Social History Narrative   04/24/2013 [name] was born and grew up in [place]. She reports that her childhood was "tough." She has 2 older sisters and a younger brother. She achieved an Associates Degree in nursing. 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 She has 2 daughters and one son. She denies any legal difficulties. She affiliates as Engineer, manufacturing. Her hobbies include reading, sewing, crafts, and cooking. She reports that her social support system consists of her friend and passed her. 04/24/2013 [name]     Family History  Problem Relation Age of Onset  . Bipolar disorder Mother   . Diabetes Mother   . Bipolar disorder Sister   . Diabetes Sister   . Alcohol abuse Father   . Hypertension Father   . Heart disease Other      Review of Systems General: negative for chills, fever, night sweats or weight changes.  Cardiovascular: see HPI Dermatological: negative for rash Respiratory: negative for cough or wheezing Urologic: negative for hematuria Abdominal: negative for vomiting, diarrhea, bright red blood per rectum, melena, or hematemesis Neurologic: negative for visual changes, syncope, All other systems reviewed and are otherwise negative except as noted above.  Labs: No results found for this basename: CKTOTAL, CKMB, TROPONINI,  in the last 72 hours Lab Results  Component Value Date   WBC 6.4 03/23/2014   HGB 12.8 03/23/2014   HCT 39.1 03/23/2014   MCV 92.7 03/23/2014   PLT 249 03/23/2014    Recent Labs Lab 03/23/14 2024  NA 140   K 4.3  CL 101  CO2 26  BUN 21  CREATININE 0.76  CALCIUM 9.0  PROT 6.9  BILITOT 0.3  ALKPHOS 110  ALT 20  AST 22  GLUCOSE 128*   No results found for this basename: CHOL, HDL, LDLCALC, TRIG   No results found for this basename: DDIMER     EKG: sinus, nl axis, no changes Telemetry in ER reviewed- all sinus with rates between 65-80, appropriate undulation Paramedic strips demonstrate bradycardia to ~50 with narrow junctional escape rhythm with quick restoration of sinus.  Physical Exam: Blood pressure 126/66, pulse 67, resp. rate 19, height 5\' 4"  (1.626 m), weight 133.811 kg (295 lb), SpO2 95.00%. General:  in no acute distress, obese Head: Normocephalic, atraumatic, sclera non-icteric, no xanthomas, nares are without discharge.  Neck: Supple. Lungs:  Clear bilaterally to auscultation without wheezes, rales, or rhonchi. Breathing is unlabored. Heart: RRR with S1 S2. No murmurs, rubs, or gallops appreciated. Abdomen: Soft, non-tender, non-distended with normoactive bowel sounds. No hepatomegaly. No rebound/guarding. No obvious abdominal masses. Msk:  Strength and tone appear normal for age. Extremities: No clubbing or cyanosis. No edema.  Distal pedal pulses are 2+ and equal bilaterally. Neuro: Alert and oriented X 3. Moves all extremities spontaneously. Psych:  Responds to questions appropriately with a normal affect.   Problem List 1. Bradycardia in the setting of nausea, malaise 2. HTN 3. Obese  Assessment and Plan:  63 y.o. female w/ PMHx significant for anxiety, bipolar disorder, HTN who presented to [facility] on 03/24/2014 with complaints of malaise and feeling unwell --> episode of junctional escape on EKG.  Difficult to discern the exact order of events but differential includes sinus node dysfunction vs. Vagal event vs. Other. What is most reasuring and makes pathological electrical dysfunction unlikely is the appropriate sinus function here in the ER with  appropriate chronotropic response to ambulation. Furthermore, when stimulated with "adrenaline" (the equipment fell on her in the ambulance) she has appropriate return of sinus activity which all suggests that this was more of vagal tone rather than sinus arrest. Normal electrolytes, troponin and delta troponin negative.  Reassured pt and explained above. It may be reasonable to get an outpatient event monitor to further monitor for these episodes and will CC primary cardiologist to see if warranted. No further inpatient cardiology needs at this time.  Discussed with ER staff who agree with the plan. Discussed indications with pt regarding returning to ER and she is agreeable with the plan.   Signed, Elias Else, Rhia Blatchford C. MD 03/24/2014, 2:27 AM

## 2014-03-24 NOTE — Discharge Instructions (Signed)
Please follow up with your cardiologist.  Return to the ER for worsening condition or new concerning symptoms.   Bradycardia Bradycardia is a term for a heart rate (pulse) that, in adults, is slower than 60 beats per minute. A normal rate is 60 to 100 beats per minute. A heart rate below 60 beats per minute may be normal for some adults with healthy hearts. If the rate is too slow, the heart may have trouble pumping the volume of blood the body needs. If the heart rate gets too low, blood flow to the brain may be decreased and may make you feel lightheaded, dizzy, or faint. The heart has a natural pacemaker in the top of the heart called the SA node (sinoatrial or sinus node). This pacemaker sends out regular electrical signals to the muscle of the heart, telling the heart muscle when to beat (contract). The electrical signal travels from the upper parts of the heart (atria) through the AV node (atrioventricular node), to the lower chambers of the heart (ventricles). The ventricles squeeze, pumping the blood from your heart to your lungs and to the rest of your body. CAUSES   Problem with the heart's electrical system.  Problem with the heart's natural pacemaker.  Heart disease, damage, or infection.  Medications.  Problems with minerals and salts (electrolytes). SYMPTOMS   Fainting (syncope).  Fatigue and weakness.  Shortness of breath (dyspnea).  Chest pain (angina).  Drowsiness.  Confusion. DIAGNOSIS   An electrocardiogram (ECG) can help your caregiver determine the type of slow heart rate you have.  If the cause is not seen on an ECG, you may need to wear a heart monitor that records your heart rhythm for several hours or days.  Blood tests. TREATMENT   Electrolyte supplements.  Medications.  Withholding medication which is causing a slow heart rate.  Pacemaker placement. SEEK IMMEDIATE MEDICAL CARE IF:   You feel lightheaded or faint.  You develop an irregular  heart rate.  You feel chest pain or have trouble breathing. MAKE SURE YOU:   Understand these instructions.  Will watch your condition.  Will get help right away if you are not doing well or get worse. Document Released: 03/20/2002 Document Revised: 09/20/2011 Document Reviewed: 10/03/2013 [facility] Patient Information 2015 [place], [place]. This information is not intended to replace advice given to you by your health care provider. Make sure you discuss any questions you have with your health care provider.

## 2014-03-24 NOTE — ED Provider Notes (Signed)
Care assumed from Dr Colin Rhein at change of shift awaiting cards eval.  Dr Elias Else has seen the patient, and feels she is safe for d/c and f/u with her cardiologist with possible event monitor.  Kalman Drape, MD 03/24/14 [phone]

## 2014-03-25 ENCOUNTER — Encounter (INDEPENDENT_AMBULATORY_CARE_PROVIDER_SITE_OTHER): Payer: Self-pay | Admitting: Internal Medicine

## 2014-03-25 ENCOUNTER — Telehealth: Payer: Self-pay

## 2014-03-25 ENCOUNTER — Ambulatory Visit (INDEPENDENT_AMBULATORY_CARE_PROVIDER_SITE_OTHER): Payer: BC Managed Care – PPO | Admitting: Internal Medicine

## 2014-03-25 VITALS — BP 108/70 | HR 64 | Temp 96.8°F | Resp 18 | Ht 64.0 in | Wt 298.7 lb

## 2014-03-25 DIAGNOSIS — K219 Gastro-esophageal reflux disease without esophagitis: Secondary | ICD-10-CM

## 2014-03-25 NOTE — Telephone Encounter (Signed)
Message copied by Bernita Raisin on Mon Mar 25, 2014 11:58 AM ------      Message from: Lonn Georgia      Created: Sun Mar 24, 2014  7:19 AM       Please arrange an appointment with Curt Bears or Dr. Bronson Ing and call her. She was in the ER Saturday night.            I could not find a scheduler to send staff messages to, Sorry!            Thanks,       Suanne Marker ------

## 2014-03-25 NOTE — Progress Notes (Signed)
Presenting complaint;  Followup for GERD.  Database;  Patient is 63 year old Caucasian female who presents for scheduled visit. Following her last visit on 01/14/2014 she underwent EGD on 01/17/2014. EGD revealed mild changes of reflux esophagitis limited the GE junction and gastritis and duodenal gastric bile reflux. H. pylori serology was negative. She was advised to take pantoprazole 40 mg for breakfast and evening meal rather than at bedtime. Now she returns for reevaluation of her symptoms.  Subjective;  Patient states she feels better. She is not having any more heartburn or retrosternal wrongness she has been using 2 times daily at bedtime. She is taking pantoprazole 30 minutes before breakfast daily.. She has no difficulty swallowing solids but she does have problem with large pills. She is not having any side effects with Pantoprazole. She denies melena or rectal bleeding or abdominal pain. Bowels move daily. She is trying to be active and swims 3 times a week. She is trying her best to lose weight. Patient states that she was at festival in [place] over the weekend(03/23/2014) and developed dizziness palpitations shortness of breath and nausea and was taken to the emergency room at [facility] CBC LFTs and troponin levels were normal. She was evaluated by Dr. Elias Else of cardiology service it was felt she may have had vasovagal syndrome. She has not had recurrence of the symptoms.    Current Medications: Outpatient Encounter Prescriptions as of 03/25/2014  Medication Sig  . ALPRAZolam (XANAX XR) 1 MG 24 hr tablet Take 1 mg by mouth 2 (two) times daily.  [name] ALPRAZolam (XANAX) 0.5 MG tablet Take 0.5 mg by mouth daily as needed for anxiety.   [name] gemfibrozil (LOPID) 600 MG tablet Take 600 mg by mouth 2 (two) times daily.  [name] glucosamine-chondroitin 500-400 MG tablet Take 2 tablets by mouth daily.  [name] lamoTRIgine (LAMICTAL) 200 MG tablet Take 400 mg by mouth at bedtime.   [name] lisinopril  (PRINIVIL,ZESTRIL) 5 MG tablet Take 1 tablet (5 mg total) by mouth daily.  . Multiple Vitamin (MULTIVITAMIN WITH MINERALS) TABS tablet Take 1 tablet by mouth daily.  . pantoprazole (PROTONIX) 40 MG tablet Take 1 tablet (40 mg total) by mouth daily.  . QUEtiapine (SEROQUEL) 200 MG tablet Take 400 mg by mouth at bedtime.  [name] venlafaxine (EFFEXOR) 37.5 MG tablet Take 37.5 mg by mouth daily.  . Vitamin D, Ergocalciferol, (DRISDOL) 50000 UNITS CAPS capsule Take 50,000 Units by mouth every 7 (seven) days.     Objective: Blood pressure 108/70, pulse 64, temperature 96.8 F (36 C), temperature source Oral, resp. rate 18, height 5\' 4"  (1.626 m), weight 298 lb 11.2 oz (135.489 kg). Patient is alert and in no acute distress. Conjunctiva is pink. Sclera is nonicteric Oropharyngeal mucosa is normal. No neck masses or thyromegaly noted. Cardiac exam with regular rhythm normal S1 and S2. No murmur or gallop noted. Lungs are clear to auscultation. Abdomen is full but soft and nontender without organomegaly or masses.  No LE edema or clubbing noted.  Labs/studies Results: Following lab data are from  ER visit.  Recent Labs  03/23/14 2024  WBC 6.4  HGB 12.8  HCT 39.1  PLT 249    BMET   Recent Labs  03/23/14 2024  NA 140  K 4.3  CL 101  CO2 26  GLUCOSE 128*  BUN 21  CREATININE 0.76  CALCIUM 9.0    LFT   Recent Labs  03/23/14 2024  PROT 6.9  ALBUMIN 3.6  AST 22  ALT  20  ALKPHOS 110  BILITOT 0.3     Assessment:  #1. GERD. Symptoms well controlled with a.m. pantoprazole and TUMS at bedtime. Pantoprazole may be interacting with antidepressants but she does not feel the effect is significant enough for her to come off PPI.    Plan:  Continue Pantoprazole at 40 mg by mouth q. A.m. Continue taking 2 TUMS at bedtime for 2-3 months and thereafter on as-needed basis. Office visit when necessary.

## 2014-03-25 NOTE — Patient Instructions (Signed)
Try using Tums on an as-needed basis after 2-3 months.

## 2014-03-25 NOTE — Telephone Encounter (Signed)
[place] pt  Tanya scheduled apt with Dr.Koneswaran in the [place] office for 04/06/14 at 2:40 pm  LM on pt's cell phone to call [place] office if she needs to make any changes

## 2014-04-08 ENCOUNTER — Other Ambulatory Visit (HOSPITAL_COMMUNITY): Payer: Self-pay | Admitting: Specialist

## 2014-04-08 DIAGNOSIS — M25562 Pain in left knee: Secondary | ICD-10-CM

## 2014-04-09 ENCOUNTER — Ambulatory Visit (INDEPENDENT_AMBULATORY_CARE_PROVIDER_SITE_OTHER): Payer: BC Managed Care – PPO | Admitting: Cardiovascular Disease

## 2014-04-09 ENCOUNTER — Encounter: Payer: Self-pay | Admitting: Cardiovascular Disease

## 2014-04-09 VITALS — BP 132/82 | HR 66 | Ht 64.0 in | Wt 299.0 lb

## 2014-04-09 DIAGNOSIS — I10 Essential (primary) hypertension: Secondary | ICD-10-CM

## 2014-04-09 DIAGNOSIS — K2100 Gastro-esophageal reflux disease with esophagitis, without bleeding: Secondary | ICD-10-CM

## 2014-04-09 DIAGNOSIS — Z9289 Personal history of other medical treatment: Secondary | ICD-10-CM

## 2014-04-09 DIAGNOSIS — R079 Chest pain, unspecified: Secondary | ICD-10-CM

## 2014-04-09 DIAGNOSIS — Z87898 Personal history of other specified conditions: Secondary | ICD-10-CM

## 2014-04-09 DIAGNOSIS — R002 Palpitations: Secondary | ICD-10-CM

## 2014-04-09 DIAGNOSIS — I4949 Other premature depolarization: Secondary | ICD-10-CM

## 2014-04-09 NOTE — Patient Instructions (Signed)
Continue all current medications. Your physician wants you to follow up in: 6 months.  You will receive a reminder letter in the mail one-two months in advance.  If you don't receive a letter, please call our office to schedule the follow up appointment   
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
## 2014-04-12 ENCOUNTER — Encounter (HOSPITAL_COMMUNITY)
Admission: RE | Admit: 2014-04-12 | Discharge: 2014-04-12 | Disposition: A | Discharge status: Home / Self Care | Payer: BC Managed Care – PPO | Source: Ambulatory Visit | Attending: Specialist | Admitting: Specialist

## 2014-04-12 ENCOUNTER — Encounter (HOSPITAL_COMMUNITY): Payer: Self-pay

## 2014-04-12 DIAGNOSIS — M25561 Pain in right knee: Secondary | ICD-10-CM | POA: Diagnosis not present

## 2014-04-12 DIAGNOSIS — Z96653 Presence of artificial knee joint, bilateral: Secondary | ICD-10-CM | POA: Insufficient documentation

## 2014-04-12 DIAGNOSIS — M25562 Pain in left knee: Secondary | ICD-10-CM | POA: Insufficient documentation

## 2014-04-12 HISTORY — DX: Essential (primary) hypertension: I10

## 2014-04-12 MED ORDER — TECHNETIUM TC 99M MEDRONATE IV KIT
25.0000 | PACK | Freq: Once | INTRAVENOUS | Status: AC | PRN
  Administered 2014-04-12: 26 via INTRAVENOUS

## 2014-07-20 ENCOUNTER — Other Ambulatory Visit: Payer: Self-pay | Admitting: Family Medicine

## 2014-08-02 ENCOUNTER — Other Ambulatory Visit: Payer: Self-pay | Admitting: Family Medicine

## 2014-08-05 ENCOUNTER — Other Ambulatory Visit: Payer: Self-pay | Admitting: *Deleted

## 2014-08-05 MED ORDER — LISINOPRIL 5 MG PO TABS: 5.0000 mg | ORAL_TABLET | Freq: Every day | ORAL | Status: DC

## 2014-08-06 ENCOUNTER — Other Ambulatory Visit: Payer: Self-pay | Admitting: Family Medicine

## 2014-08-07 ENCOUNTER — Other Ambulatory Visit: Payer: Self-pay | Admitting: Family Medicine

## 2014-08-07 ENCOUNTER — Other Ambulatory Visit: Payer: Self-pay | Admitting: *Deleted

## 2014-08-07 DIAGNOSIS — Z853 Personal history of malignant neoplasm of breast: Secondary | ICD-10-CM

## 2014-08-07 MED ORDER — LISINOPRIL 5 MG PO TABS: 5.0000 mg | ORAL_TABLET | Freq: Every day | ORAL | Status: DC

## 2014-08-10 ENCOUNTER — Other Ambulatory Visit: Payer: Self-pay | Admitting: Family Medicine

## 2014-08-21 ENCOUNTER — Ambulatory Visit
Admission: RE | Admit: 2014-08-21 | Discharge: 2014-08-21 | Disposition: A | Discharge status: Home / Self Care | Payer: BLUE CROSS/BLUE SHIELD | Source: Ambulatory Visit | Attending: Family Medicine | Admitting: Family Medicine

## 2014-08-21 DIAGNOSIS — Z853 Personal history of malignant neoplasm of breast: Secondary | ICD-10-CM

## 2014-08-22 ENCOUNTER — Encounter: Payer: Self-pay | Admitting: Family Medicine

## 2014-08-22 ENCOUNTER — Ambulatory Visit (INDEPENDENT_AMBULATORY_CARE_PROVIDER_SITE_OTHER): Payer: BLUE CROSS/BLUE SHIELD | Admitting: Family Medicine

## 2014-08-22 VITALS — BP 122/80 | Ht 64.0 in | Wt 283.0 lb

## 2014-08-22 DIAGNOSIS — Z79899 Other long term (current) drug therapy: Secondary | ICD-10-CM

## 2014-08-22 DIAGNOSIS — R3589 Other polyuria: Secondary | ICD-10-CM

## 2014-08-22 DIAGNOSIS — I10 Essential (primary) hypertension: Secondary | ICD-10-CM

## 2014-08-22 DIAGNOSIS — R358 Other polyuria: Secondary | ICD-10-CM

## 2014-08-22 DIAGNOSIS — E781 Pure hyperglyceridemia: Secondary | ICD-10-CM

## 2014-08-22 DIAGNOSIS — E559 Vitamin D deficiency, unspecified: Secondary | ICD-10-CM

## 2014-08-22 DIAGNOSIS — R32 Unspecified urinary incontinence: Secondary | ICD-10-CM

## 2014-08-22 DIAGNOSIS — R5383 Other fatigue: Secondary | ICD-10-CM

## 2014-08-22 LAB — POCT URINALYSIS DIPSTICK
PH UA: 5
Spec Grav, UA: 1.02

## 2014-08-22 NOTE — Progress Notes (Signed)
   Subjective:    Patient ID: [name], female    DOB: [birth year], 64 y.o.   MRN: [id number]  Hypertension This is a chronic problem. The current episode started more than 1 year ago.  Going to [employer]. Swimming two times a week.  She has not been taking her blood pressure medicine over the past couple months and she states her blood pressure been doing good she is trying to lose weight. She is try to cut back on salt. She is disabled. Wetting self during sleep a couple of times in the past couple of weeks.   Review of Systems    denies chest tightness pressure pain shortness breath does relate nocturia. I'm started to question her regarding sleep apnea she states she does not want to answer the questions and relates that she does not want any testing for sleep apnea. I discussed this in detail with her but she still refuses. Objective:   Physical Exam  Lungs are clear no crackles heart regular pulse normal blood pressure taken twice normal. Extremities no edema skin warm dry neurologic grossly normal      Assessment & Plan:  Nocturia-could be related to diabetes. Urine culture sent, patient was warned if high fevers or chills follow-up immediately  Obesity patient going to [employer] trying to lose weight, she was encouraged to continue forward with this trying to lose weight.  Hypertension patient not on any medication blood pressure looks great stop lisinopril, she notices her blood pressure going up she is to let us know.  History of vitamin D deficiency check vitamin D  High risk medications check liver panel and CBC, she is on multiple medications that can affect the liver and bone marrow check lab work.  Hyperlipidemia check lipid profile, has history hyperlipidemia watch diet closely. Await lab work  Fatigue tiredness check thyroid function, patient at risk for hypothyroidism.  Thyroid because of her multiple medical conditions was ordered

## 2014-08-23 LAB — CBC WITH DIFFERENTIAL/PLATELET
BASOS ABS: 0 10*3/uL (ref 0.0–0.1)
Basophils Relative: 0 % (ref 0–1)
EOS ABS: 0.1 10*3/uL (ref 0.0–0.7)
Eosinophils Relative: 2 % (ref 0–5)
HCT: 40.5 % (ref 36.0–46.0)
Hemoglobin: 13.6 g/dL (ref 12.0–15.0)
Lymphocytes Relative: 32 % (ref 12–46)
Lymphs Abs: 2.1 10*3/uL (ref 0.7–4.0)
MCH: 30.7 pg (ref 26.0–34.0)
MCHC: 33.6 g/dL (ref 30.0–36.0)
MCV: 91.4 fL (ref 78.0–100.0)
MPV: 9.9 fL (ref 8.6–12.4)
Monocytes Absolute: 0.7 10*3/uL (ref 0.1–1.0)
Monocytes Relative: 10 % (ref 3–12)
NEUTROS PCT: 56 % (ref 43–77)
Neutro Abs: 3.6 10*3/uL (ref 1.7–7.7)
Platelets: 321 10*3/uL (ref 150–400)
RBC: 4.43 MIL/uL (ref 3.87–5.11)
RDW: 14.2 % (ref 11.5–15.5)
WBC: 6.5 10*3/uL (ref 4.0–10.5)

## 2014-08-23 LAB — LIPID PANEL
CHOLESTEROL: 142 mg/dL (ref 0–200)
HDL: 43 mg/dL (ref >39)
LDL Cholesterol: 72 mg/dL (ref 0–99)
TRIGLYCERIDES: 136 mg/dL (ref <150)
Total CHOL/HDL Ratio: 3.3 Ratio
VLDL: 27 mg/dL (ref 0–40)

## 2014-08-23 LAB — BASIC METABOLIC PANEL
BUN: 23 mg/dL (ref 6–23)
CHLORIDE: 102 meq/L (ref 96–112)
CO2: 28 mEq/L (ref 19–32)
Calcium: 9.8 mg/dL (ref 8.4–10.5)
Creat: 0.78 mg/dL (ref 0.50–1.10)
Glucose, Bld: 119 mg/dL — ABNORMAL HIGH (ref 70–99)
POTASSIUM: 4.6 meq/L (ref 3.5–5.3)
Sodium: 140 mEq/L (ref 135–145)

## 2014-08-23 LAB — TSH: TSH: 2.044 u[IU]/mL (ref 0.350–4.500)

## 2014-08-24 LAB — HEMOGLOBIN A1C
HEMOGLOBIN A1C: 6 % — AB (ref <5.7)
Mean Plasma Glucose: 126 mg/dL — ABNORMAL HIGH (ref <117)

## 2014-08-24 LAB — VITAMIN D 25 HYDROXY (VIT D DEFICIENCY, FRACTURES): Vit D, 25-Hydroxy: 32 ng/mL (ref 30–100)

## 2014-08-24 LAB — URINE CULTURE
COLONY COUNT: NO GROWTH
Organism ID, Bacteria: NO GROWTH

## 2014-08-24 LAB — MICROALBUMIN, URINE: Microalb, Ur: 0.5 mg/dL (ref <2.0)

## 2014-08-26 ENCOUNTER — Encounter: Payer: Self-pay | Admitting: Family Medicine

## 2014-09-15 ENCOUNTER — Other Ambulatory Visit: Payer: Self-pay | Admitting: Family Medicine

## 2014-09-17 ENCOUNTER — Encounter: Payer: Self-pay | Admitting: Family Medicine

## 2014-09-17 NOTE — Telephone Encounter (Signed)
Certainly started the patient is having problems like that. There are many different things that can cause the whites of the eye to be reddened and burning. If there is crusting that is discolored it could be infection if it is more whitish crusting or just itching them more than likely it allergy issue. May try over-the-counter allergy drops such as Naphcon A if it seems like allergy. If this seems more like infection we will need to call in some drops if it persists the next step being seen or being seen by ophthalmology optometry thank you. Please call patient

## 2014-09-20 ENCOUNTER — Other Ambulatory Visit: Payer: Self-pay | Admitting: Family Medicine

## 2015-02-07 ENCOUNTER — Encounter: Payer: Self-pay | Admitting: Family Medicine

## 2015-02-07 ENCOUNTER — Ambulatory Visit (INDEPENDENT_AMBULATORY_CARE_PROVIDER_SITE_OTHER): Payer: 59 | Admitting: Family Medicine

## 2015-02-07 VITALS — BP 128/72 | Temp 98.3°F | Ht 64.0 in | Wt 261.2 lb

## 2015-02-07 DIAGNOSIS — E785 Hyperlipidemia, unspecified: Secondary | ICD-10-CM | POA: Diagnosis not present

## 2015-02-07 DIAGNOSIS — Z79899 Other long term (current) drug therapy: Secondary | ICD-10-CM

## 2015-02-07 DIAGNOSIS — I889 Nonspecific lymphadenitis, unspecified: Secondary | ICD-10-CM

## 2015-02-07 MED ORDER — AMOXICILLIN 500 MG PO CAPS: 500.0000 mg | ORAL_CAPSULE | Freq: Three times a day (TID) | ORAL | Status: DC

## 2015-02-07 NOTE — Progress Notes (Signed)
   Subjective:    Patient ID: [name], female    DOB: Mar 11, 1951, 64 y.o.   MRN: [id number]  Otalgia  There is pain in the left ear. She has tried acetaminophen for the symptoms.   Last sinus and ear infx a long time   Helped some not al ot   No sinus or throa t prob    No high fevers no chills Review of Systems  HENT: Positive for ear pain.    no rash     Objective:   Physical Exam  Alert mild malaise. HEENT left TM retracted pharynx some erythema neck anterior lymph node tenderness. Neck supple. Lungs clear. Heart regular in rhythm.      Assessment & Plan:  Impression left cervical lymphadenitis plus minus middle ear involvement plan anti-bites prescribed. Symptom care discussed warning signs discussed WSL

## 2015-02-07 NOTE — Progress Notes (Deleted)
   Subjective:    Patient ID: [name], female    DOB: 11-20-1950, 64 y.o.   MRN: [id number]  HPI    Review of Systems     Objective:   Physical Exam        Assessment & Plan:

## 2015-02-20 ENCOUNTER — Encounter: Payer: Self-pay | Admitting: Family Medicine

## 2015-02-20 DIAGNOSIS — I10 Essential (primary) hypertension: Secondary | ICD-10-CM

## 2015-02-20 DIAGNOSIS — R739 Hyperglycemia, unspecified: Secondary | ICD-10-CM

## 2015-02-20 DIAGNOSIS — E785 Hyperlipidemia, unspecified: Secondary | ICD-10-CM

## 2015-02-20 DIAGNOSIS — Z79899 Other long term (current) drug therapy: Secondary | ICD-10-CM

## 2015-02-25 LAB — BASIC METABOLIC PANEL
BUN/Creatinine Ratio: 27 — ABNORMAL HIGH (ref 11–26)
BUN: 20 mg/dL (ref 8–27)
CALCIUM: 9.7 mg/dL (ref 8.7–10.3)
CO2: 23 mmol/L (ref 18–29)
CREATININE: 0.74 mg/dL (ref 0.57–1.00)
Chloride: 103 mmol/L (ref 97–108)
GFR calc Af Amer: 100 mL/min/{1.73_m2} (ref >59)
GFR calc non Af Amer: 87 mL/min/{1.73_m2} (ref >59)
GLUCOSE: 115 mg/dL — AB (ref 65–99)
Potassium: 4.8 mmol/L (ref 3.5–5.2)
SODIUM: 146 mmol/L — AB (ref 134–144)

## 2015-02-25 LAB — HEPATIC FUNCTION PANEL
ALBUMIN: 4.2 g/dL (ref 3.6–4.8)
ALK PHOS: 110 IU/L (ref 39–117)
ALT: 14 IU/L (ref 0–32)
AST: 18 IU/L (ref 0–40)
Bilirubin Total: 0.4 mg/dL (ref 0.0–1.2)
Bilirubin, Direct: 0.1 mg/dL (ref 0.00–0.40)
TOTAL PROTEIN: 6.7 g/dL (ref 6.0–8.5)

## 2015-02-25 LAB — LIPID PANEL
Chol/HDL Ratio: 4.1 ratio units (ref 0.0–4.4)
Cholesterol, Total: 174 mg/dL (ref 100–199)
HDL: 42 mg/dL (ref >39)
LDL Calculated: 99 mg/dL (ref 0–99)
TRIGLYCERIDES: 164 mg/dL — AB (ref 0–149)
VLDL Cholesterol Cal: 33 mg/dL (ref 5–40)

## 2015-02-25 LAB — HEMOGLOBIN A1C
ESTIMATED AVERAGE GLUCOSE: 131 mg/dL
HEMOGLOBIN A1C: 6.2 % — AB (ref 4.8–5.6)

## 2015-02-27 ENCOUNTER — Encounter: Payer: Self-pay | Admitting: Family Medicine

## 2015-02-27 ENCOUNTER — Ambulatory Visit (INDEPENDENT_AMBULATORY_CARE_PROVIDER_SITE_OTHER): Payer: 59 | Admitting: Family Medicine

## 2015-02-27 VITALS — BP 112/68 | Ht 64.0 in | Wt 263.0 lb

## 2015-02-27 DIAGNOSIS — R739 Hyperglycemia, unspecified: Secondary | ICD-10-CM | POA: Diagnosis not present

## 2015-02-27 DIAGNOSIS — M159 Polyosteoarthritis, unspecified: Secondary | ICD-10-CM

## 2015-02-27 DIAGNOSIS — M150 Primary generalized (osteo)arthritis: Secondary | ICD-10-CM

## 2015-02-27 DIAGNOSIS — E785 Hyperlipidemia, unspecified: Secondary | ICD-10-CM

## 2015-02-27 DIAGNOSIS — I10 Essential (primary) hypertension: Secondary | ICD-10-CM | POA: Diagnosis not present

## 2015-02-27 DIAGNOSIS — E781 Pure hyperglyceridemia: Secondary | ICD-10-CM

## 2015-02-27 MED ORDER — DICLOFENAC SODIUM 1 % TD GEL: 4.0000 g | Freq: Four times a day (QID) | TRANSDERMAL | Status: DC | PRN

## 2015-02-27 MED ORDER — MELOXICAM 7.5 MG PO TABS
7.5000 mg | ORAL_TABLET | Freq: Every day | ORAL | Status: DC
Start: 2015-02-27 — End: 2015-10-22

## 2015-02-27 NOTE — Progress Notes (Signed)
   Subjective:    Patient ID: [name], female    DOB: July 03, 1951, 64 y.o.   MRN: [id number]  Hypertension This is a chronic problem. The current episode started more than 1 year ago. Pertinent negatives include no chest pain.  going to weight watchers. Has lost over 40 lbs.   Had bw done on 02/24/15.  Refill Voltaren Gel  Recheck left ear. Still having some pain.   Left arm mole There is a very long discussion with patient regarding her lab results regarding her health regarding exercise diet and losing weight and reduction of risk factors greater and 25 minutes spent with patient discussing all of these issues greater than half in consultation and answering questions  Review of Systems  Constitutional: Negative for activity change, appetite change and fatigue.  HENT: Negative for congestion.   Respiratory: Negative for cough.   Cardiovascular: Negative for chest pain.  Gastrointestinal: Negative for abdominal pain.  Endocrine: Negative for polydipsia and polyphagia.  Neurological: Negative for weakness.  Psychiatric/Behavioral: Negative for confusion.       Objective:   Physical Exam  Constitutional: She appears well-nourished. No distress.  Cardiovascular: Normal rate, regular rhythm and normal heart sounds.   No murmur heard. Pulmonary/Chest: Effort normal and breath sounds normal. No respiratory distress.  Musculoskeletal: She exhibits no edema.  Lymphadenopathy:    She has no cervical adenopathy.  Neurological: She is alert. She exhibits normal muscle tone.  Psychiatric: Her behavior is normal.  Vitals reviewed.         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

## 2015-05-05 ENCOUNTER — Encounter: Payer: Self-pay | Admitting: Family Medicine

## 2015-06-26 ENCOUNTER — Ambulatory Visit (INDEPENDENT_AMBULATORY_CARE_PROVIDER_SITE_OTHER): Payer: 59 | Admitting: Family Medicine

## 2015-06-26 ENCOUNTER — Encounter: Payer: Self-pay | Admitting: Family Medicine

## 2015-06-26 VITALS — BP 130/78 | Temp 98.2°F | Ht 64.0 in | Wt 263.0 lb

## 2015-06-26 DIAGNOSIS — R002 Palpitations: Secondary | ICD-10-CM

## 2015-06-26 DIAGNOSIS — H1089 Other conjunctivitis: Secondary | ICD-10-CM

## 2015-06-26 MED ORDER — OLOPATADINE HCL 0.1 % OP SOLN: 1.0000 [drp] | Freq: Two times a day (BID) | OPHTHALMIC | Status: DC

## 2015-06-26 MED ORDER — TRIAMCINOLONE ACETONIDE 0.1 % EX CREA: 1.0000 "application " | TOPICAL_CREAM | Freq: Two times a day (BID) | CUTANEOUS | Status: DC

## 2015-06-26 NOTE — Progress Notes (Signed)
   Subjective:    Patient ID: [name], female    DOB: [birth year], 64 y.o.   MRN: [id number]  Eye Pain  This is a new problem. The current episode started in the past 7 days. The problem occurs intermittently. The problem has been unchanged. There was no injury mechanism. The pain is moderate. Associated symptoms include itching. She has tried eye drops for the symptoms. The treatment provided no relief.   Patient mainly having an itchy eye in the corner aspect low bit of redness does not know of anything that triggered Toward the end of the visit the patient brings up that she is also having some intermittent palpitations she denies it running fast she states it comes and goes did not does not cause any chest tightness pressure pain but she is worried about she has no history of heart disease denies getting short of breath with her usual activity patient not much in the way of exercise  Review of Systems  Eyes: Positive for pain.  Skin: Positive for itching.   No fever no vomiting no cough or congestion    Objective:   Physical Exam  Mild conjunctivitis noted in the right eye with some redness and slight tenderness along the eyelid margin toward the inner aspect no sinus symptoms throat normal neck no masses lungs clear Lungs are clear hearts regular pulse normal BP good EKG normal     Assessment & Plan:  Allergy eye drops recommended triamcinolone cream follow-up if progressive trouble no sign of any bacterial component  The palpitations I feel are benign if they start becoming more frequently noted then we will set up telemetry

## 2015-07-04 ENCOUNTER — Encounter: Payer: Self-pay | Admitting: Family Medicine

## 2015-07-05 ENCOUNTER — Emergency Department (HOSPITAL_COMMUNITY)
Admission: EM | Admit: 2015-07-05 | Discharge: 2015-07-05 | Disposition: A | Discharge status: Home / Self Care | Payer: 59 | Attending: Emergency Medicine | Admitting: Emergency Medicine

## 2015-07-05 ENCOUNTER — Encounter (HOSPITAL_COMMUNITY): Payer: Self-pay | Admitting: *Deleted

## 2015-07-05 ENCOUNTER — Emergency Department (HOSPITAL_COMMUNITY): Payer: 59

## 2015-07-05 DIAGNOSIS — F319 Bipolar disorder, unspecified: Secondary | ICD-10-CM | POA: Diagnosis not present

## 2015-07-05 DIAGNOSIS — Z853 Personal history of malignant neoplasm of breast: Secondary | ICD-10-CM | POA: Diagnosis not present

## 2015-07-05 DIAGNOSIS — Y9289 Other specified places as the place of occurrence of the external cause: Secondary | ICD-10-CM | POA: Insufficient documentation

## 2015-07-05 DIAGNOSIS — S40021A Contusion of right upper arm, initial encounter: Secondary | ICD-10-CM | POA: Insufficient documentation

## 2015-07-05 DIAGNOSIS — Z7952 Long term (current) use of systemic steroids: Secondary | ICD-10-CM | POA: Insufficient documentation

## 2015-07-05 DIAGNOSIS — Z79899 Other long term (current) drug therapy: Secondary | ICD-10-CM | POA: Diagnosis not present

## 2015-07-05 DIAGNOSIS — S0083XA Contusion of other part of head, initial encounter: Secondary | ICD-10-CM

## 2015-07-05 DIAGNOSIS — E7800 Pure hypercholesterolemia, unspecified: Secondary | ICD-10-CM | POA: Diagnosis not present

## 2015-07-05 DIAGNOSIS — W109XXA Fall (on) (from) unspecified stairs and steps, initial encounter: Secondary | ICD-10-CM | POA: Insufficient documentation

## 2015-07-05 DIAGNOSIS — S0003XA Contusion of scalp, initial encounter: Secondary | ICD-10-CM | POA: Diagnosis not present

## 2015-07-05 DIAGNOSIS — Y9301 Activity, walking, marching and hiking: Secondary | ICD-10-CM | POA: Diagnosis not present

## 2015-07-05 DIAGNOSIS — Z791 Long term (current) use of non-steroidal anti-inflammatories (NSAID): Secondary | ICD-10-CM | POA: Insufficient documentation

## 2015-07-05 DIAGNOSIS — Y999 Unspecified external cause status: Secondary | ICD-10-CM | POA: Diagnosis not present

## 2015-07-05 DIAGNOSIS — F419 Anxiety disorder, unspecified: Secondary | ICD-10-CM | POA: Insufficient documentation

## 2015-07-05 DIAGNOSIS — M1990 Unspecified osteoarthritis, unspecified site: Secondary | ICD-10-CM | POA: Insufficient documentation

## 2015-07-05 DIAGNOSIS — Z8669 Personal history of other diseases of the nervous system and sense organs: Secondary | ICD-10-CM | POA: Diagnosis not present

## 2015-07-05 DIAGNOSIS — I10 Essential (primary) hypertension: Secondary | ICD-10-CM | POA: Insufficient documentation

## 2015-07-05 DIAGNOSIS — E669 Obesity, unspecified: Secondary | ICD-10-CM | POA: Diagnosis not present

## 2015-07-05 DIAGNOSIS — S60031A Contusion of right middle finger without damage to nail, initial encounter: Secondary | ICD-10-CM | POA: Diagnosis not present

## 2015-07-05 DIAGNOSIS — Z8719 Personal history of other diseases of the digestive system: Secondary | ICD-10-CM | POA: Insufficient documentation

## 2015-07-05 DIAGNOSIS — S6991XA Unspecified injury of right wrist, hand and finger(s), initial encounter: Secondary | ICD-10-CM

## 2015-07-05 DIAGNOSIS — R42 Dizziness and giddiness: Secondary | ICD-10-CM

## 2015-07-05 LAB — CBC WITH DIFFERENTIAL/PLATELET
BASOS ABS: 0 10*3/uL (ref 0.0–0.1)
BASOS PCT: 0 %
Eosinophils Absolute: 0.1 10*3/uL (ref 0.0–0.7)
Eosinophils Relative: 1 %
HEMATOCRIT: 41.2 % (ref 36.0–46.0)
Hemoglobin: 13.5 g/dL (ref 12.0–15.0)
LYMPHS PCT: 21 %
Lymphs Abs: 1.9 10*3/uL (ref 0.7–4.0)
MCH: 31.2 pg (ref 26.0–34.0)
MCHC: 32.8 g/dL (ref 30.0–36.0)
MCV: 95.2 fL (ref 78.0–100.0)
Monocytes Absolute: 0.9 10*3/uL (ref 0.1–1.0)
Monocytes Relative: 10 %
NEUTROS ABS: 6.2 10*3/uL (ref 1.7–7.7)
NEUTROS PCT: 68 %
Platelets: 270 10*3/uL (ref 150–400)
RBC: 4.33 MIL/uL (ref 3.87–5.11)
RDW: 13.3 % (ref 11.5–15.5)
WBC: 9.2 10*3/uL (ref 4.0–10.5)

## 2015-07-05 LAB — BASIC METABOLIC PANEL
ANION GAP: 6 (ref 5–15)
BUN: 28 mg/dL — ABNORMAL HIGH (ref 6–20)
CALCIUM: 9.6 mg/dL (ref 8.9–10.3)
CO2: 29 mmol/L (ref 22–32)
Chloride: 105 mmol/L (ref 101–111)
Creatinine, Ser: 0.61 mg/dL (ref 0.44–1.00)
GFR calc Af Amer: >60 mL/min (ref >60)
GFR calc non Af Amer: >60 mL/min (ref >60)
Glucose, Bld: 116 mg/dL — ABNORMAL HIGH (ref 65–99)
POTASSIUM: 4.4 mmol/L (ref 3.5–5.1)
Sodium: 140 mmol/L (ref 135–145)

## 2015-07-05 MED ORDER — MECLIZINE HCL 25 MG PO TABS: 25.0000 mg | ORAL_TABLET | Freq: Three times a day (TID) | ORAL | Status: DC | PRN

## 2015-07-05 MED ORDER — MECLIZINE HCL 12.5 MG PO TABS
25.0000 mg | ORAL_TABLET | Freq: Once | ORAL | Status: AC
  Administered 2015-07-05: 25 mg via ORAL
  Filled 2015-07-05: qty 2

## 2015-07-05 MED ORDER — HYDROCODONE-ACETAMINOPHEN 5-325 MG PO TABS: 1.0000 | ORAL_TABLET | ORAL | Status: DC | PRN

## 2015-07-05 NOTE — ED Notes (Signed)
Pt states she fell going down her steps, she tripped. States she landed on her right side, she localizes pain to her right upper arm and right hand (primarliy the middle finger). Pt states she did hit her head, denies loss of consciousness. Pt states since then she has been dizzy. Denies any blood thinner use.

## 2015-07-05 NOTE — ED Provider Notes (Signed)
CSN: [id number]     Arrival date & time 07/05/15  1028 History   First MD Initiated Contact with Patient 07/05/15 1102     Chief Complaint  Patient presents with  . Fall     (Consider location/radiation/quality/duration/timing/severity/associated sxs/prior Treatment) HPI Comments: Patient is a 64 year old female who presents to the emergency department following a fall.  The patient states that on last evening she was walking in a darkened area, missed a step and fell. She states she hit the right side of her head, injured her right upper arm, and her right hand. She particularly complains of pain in the right middle finger. She denies loss of consciousness. But has had problems with dizziness since sustaining the injury to her head. She states that this morning in particular while attempting to take a bath she felt very dizzy and was about to fall against a wall but was able to catch herself. She complains of some right frontal area headache, but she states this is the same area that she hit when she fell. There's been no vomiting. The patient states she was able to eat some breakfast this morning without any problem or complication. She is able to dress herself. But states that when she changes position she has a sensation that she is falling, and or about to pass out. Patient denies being on any anticoagulation medications. She has no history of any bleeding disorders. There's been no previous operations involving the head, or the upper extremity on the right.  Patient is a 64 y.o. female presenting with fall. The history is provided by the patient.  Fall Associated symptoms include arthralgias.    Past Medical History  Diagnosis Date  . Bipolar 1 disorder ([place])   . Anxiety   . Cancer [facility]) breast cancer 7 yrs ago  . High cholesterol   . Breast cancer, left breast ([place]) 07/22/2011  . Depression   . OA (osteoarthritis)   . Obesity   . IBS (irritable bowel syndrome)   . Polycystic  ovarian disease   . Sleep apnea   . Impaired fasting glucose   . Hypertension    Past Surgical History  Procedure Laterality Date  . Total knee arthroplasty Bilateral right knee    2012, 2007  . Cholecystectomy    . Abdominal hysterectomy    . Breast lumpectomy Left 2008  . Appendectomy    . Colonoscopy N/A 09/13/2013    Procedure: COLONOSCOPY;  Surgeon: [name], MD;  Location: [facility];  Service: Endoscopy;  Laterality: N/A;  730  . Esophagogastroduodenoscopy N/A 01/17/2014    Procedure: ESOPHAGOGASTRODUODENOSCOPY (EGD);  Surgeon: [name], MD;  Location: [facility];  Service: Endoscopy;  Laterality: N/A;  245   Family History  Problem Relation Age of Onset  . Bipolar disorder Mother   . Diabetes Mother   . Bipolar disorder Sister   . Diabetes Sister   . Alcohol abuse Father   . Hypertension Father   . Heart disease Other    Social History  Substance Use Topics  . Smoking status: Never Smoker   . Smokeless tobacco: Never Used  . Alcohol Use: No   OB History    No data available     Review of Systems  Musculoskeletal: Positive for arthralgias.  Neurological: Positive for dizziness.  Psychiatric/Behavioral: The patient is nervous/anxious.        Depression  All other systems reviewed and are negative.     Allergies  Imipramine  and Tricor  Home Medications   Prior to Admission medications   Medication Sig Start Date End Date Taking? Authorizing Provider  Acetaminophen (TYLENOL PO) Take by mouth. ER 2 daily   Yes Historical Provider, MD  ALPRAZolam (XANAX XR) 1 MG 24 hr tablet Take 1 mg by mouth 2 (two) times daily.   Yes Historical Provider, MD  ALPRAZolam [name]) 1 MG tablet Take 1 mg by mouth 3 (three) times daily. 08/06/14  Yes Historical Provider, MD  diclofenac sodium (VOLTAREN) 1 % GEL Apply 4 g topically 4 (four) times daily as needed. 02/27/15  Yes Kathyrn Drown, MD  gemfibrozil (LOPID) 600 MG tablet TAKE 1 TABLET (600 MG TOTAL) BY  MOUTH 2 (TWO) TIMES DAILY. 09/16/14  Yes Kathyrn Drown, MD  glucosamine-chondroitin 500-400 MG tablet Take 2 tablets by mouth daily.   Yes Historical Provider, MD  lamoTRIgine (LAMICTAL) 200 MG tablet Take 400 mg by mouth at bedtime.    Yes Historical Provider, MD  meloxicam (MOBIC) 7.5 MG tablet Take 1 tablet (7.5 mg total) by mouth daily. 02/27/15  Yes Kathyrn Drown, MD  Multiple Vitamin (MULTIVITAMIN WITH MINERALS) TABS tablet Take 1 tablet by mouth daily.   Yes Historical Provider, MD  olopatadine (PATANOL) 0.1 % ophthalmic solution Place 1 drop into both eyes 2 (two) times daily. 06/26/15  Yes Kathyrn Drown, MD  pantoprazole (PROTONIX) 40 MG tablet TAKE 1 TABLET (40 MG TOTAL) BY MOUTH DAILY. 09/20/14  Yes Kathyrn Drown, MD  QUEtiapine (SEROQUEL) 200 MG tablet Take 400 mg by mouth at bedtime. 05/07/13  Yes [name], MD  triamcinolone cream (KENALOG) 0.1 % Apply 1 application topically 2 (two) times daily. 06/26/15  Yes Kathyrn Drown, MD  venlafaxine (EFFEXOR) 37.5 MG tablet Take 37.5 mg by mouth daily.   Yes Historical Provider, MD  Vitamin D, Ergocalciferol, (DRISDOL) 50000 UNITS CAPS capsule Take 50,000 Units by mouth every 7 (seven) days.   Yes Historical Provider, MD   BP 140/71 mmHg  Pulse 68  Temp(Src) 98.1 F (36.7 C) (Oral)  Resp 13  Ht 5\' 4"  (1.626 m)  Wt 117.935 kg  BMI 44.61 kg/m2  SpO2 99% Physical Exam  Constitutional: She is oriented to person, place, and time. She appears well-developed and well-nourished.  Non-toxic appearance.  HENT:  Head: Normocephalic.  Right Ear: Tympanic membrane and external ear normal.  Left Ear: Tympanic membrane and external ear normal.  There is a bruise and small hematoma of the right for head area extending into the right frontal scalp. There is a negative Battle's sign.  There is no trauma to the tongue or teeth. Airway is patent.  Eyes: EOM and lids are normal. Pupils are equal, round, and reactive to light.  Disc sharp  and flat bilat.  No hemorrhage no exudate. Anterior chambers clear.  Neck: Normal range of motion. Neck supple. Carotid bruit is not present.  Cardiovascular: Normal rate, regular rhythm, normal heart sounds, intact distal pulses and normal pulses.   Pulmonary/Chest: Breath sounds normal. No respiratory distress.  There is no bruise to the chest wall or rib areas. There is symmetrical rise and fall of the chest. The patient speaks in complete sentences.  Abdominal: Soft. Bowel sounds are normal. There is no tenderness. There is no guarding.  Musculoskeletal: Normal range of motion.  There is pain and bruising of the PIP of the right long finger. There is soreness of the right hand. There is a bruise to the  bicep tricep area of the right upper arm.  Lymphadenopathy:       Head (right side): No submandibular adenopathy present.       Head (left side): No submandibular adenopathy present.    She has no cervical adenopathy.  Neurological: She is alert and oriented to person, place, and time. She has normal strength. No cranial nerve deficit or sensory deficit.  Changing patient's position from lying down to sitting up causes dizziness with a sensation of falling and possibly passing out. Grip symmetrical. No motor or sensory deficit noted on exam.  Skin: Skin is warm and dry.  Psychiatric: She has a normal mood and affect. Her speech is normal.  Nursing note and vitals reviewed.   ED Course  Procedures (including critical care time) Labs Review Labs Reviewed  BASIC METABOLIC PANEL - Abnormal; Notable for the following:    Glucose, Bld 116 (*)    BUN 28 (*)    All other components within normal limits  CBC WITH DIFFERENTIAL/PLATELET    Imaging Review Ct Head Wo Contrast  07/05/2015  CLINICAL DATA:  64 year old female who fell last night while going down her steps EXAM: CT HEAD WITHOUT CONTRAST CT CERVICAL SPINE WITHOUT CONTRAST TECHNIQUE: Multidetector CT imaging of the head and cervical  spine was performed following the standard protocol without intravenous contrast. Multiplanar CT image reconstructions of the cervical spine were also generated. COMPARISON:  None. FINDINGS: CT HEAD FINDINGS Negative for acute intracranial hemorrhage, acute infarction, mass, mass effect, hydrocephalus or midline shift. Gray-white differentiation is preserved throughout. No focal scalp hematoma or contusion. No calvarial fracture or abnormality. Globes and orbits are symmetric and intact bilaterally. Normal aeration of the bilateral mastoid air cells and visualized paranasal sinuses. Trace atherosclerotic calcification in both cavernous carotid arteries. CT CERVICAL SPINE FINDINGS No acute fracture, malalignment or prevertebral soft tissue swelling. Unremarkable CT appearance of the thyroid gland. No acute soft tissue abnormality. The lung apices are unremarkable. IMPRESSION: CT HEAD 1. Negative. 2. Trace atherosclerotic calcifications in both cavernous carotid arteries. CT CSPINE 1. No acute fracture or malalignment. Electronically Signed   By: Jacqulynn Cadet M.D.   On: 07/05/2015 12:26   Ct Cervical Spine Wo Contrast  07/05/2015  CLINICAL DATA:  64 year old female who fell last night while going down her steps EXAM: CT HEAD WITHOUT CONTRAST CT CERVICAL SPINE WITHOUT CONTRAST TECHNIQUE: Multidetector CT imaging of the head and cervical spine was performed following the standard protocol without intravenous contrast. Multiplanar CT image reconstructions of the cervical spine were also generated. COMPARISON:  None. FINDINGS: CT HEAD FINDINGS Negative for acute intracranial hemorrhage, acute infarction, mass, mass effect, hydrocephalus or midline shift. Gray-white differentiation is preserved throughout. No focal scalp hematoma or contusion. No calvarial fracture or abnormality. Globes and orbits are symmetric and intact bilaterally. Normal aeration of the bilateral mastoid air cells and visualized paranasal  sinuses. Trace atherosclerotic calcification in both cavernous carotid arteries. CT CERVICAL SPINE FINDINGS No acute fracture, malalignment or prevertebral soft tissue swelling. Unremarkable CT appearance of the thyroid gland. No acute soft tissue abnormality. The lung apices are unremarkable. IMPRESSION: CT HEAD 1. Negative. 2. Trace atherosclerotic calcifications in both cavernous carotid arteries. CT CSPINE 1. No acute fracture or malalignment. Electronically Signed   By: Jacqulynn Cadet M.D.   On: 07/05/2015 12:26   Dg Finger Middle Right  07/05/2015  CLINICAL DATA:  Fall.  Pain in right middle finger. EXAM: RIGHT MIDDLE FINGER 2+V COMPARISON:  None FINDINGS: Mild diffuse soft tissue  swelling. There is no evidence of fracture or dislocation. There is no evidence of arthropathy or other focal bone abnormality. Soft tissues are unremarkable. IMPRESSION: 1. Soft tissue swelling. 2. No acute findings. Electronically Signed   By: Kerby Moors M.D.   On: 07/05/2015 12:26   I have personally reviewed and evaluated these images and lab results as part of my medical decision-making.   EKG Interpretation None     EKG: {ekg findings:NSR at 75/min. No high degree blocks. No QT prolongation. No STEMI.  MDM  Vital signs reviewed. CT head and neck. Negative for acute problem. Xray of the right middle finger is neg for fx or dislocation. EKG NSR. Pt sustained contusion of the forehead/scalp and sprain of the middle finger. I have asked the patient to use caution with change of position she has some dizziness with change of position. The patient will be treated for antivert. Pt will return to the ED or see PCP if not improving.    Final diagnoses:  Finger injury, right, initial encounter    *I have reviewed nursing notes, vital signs, and all appropriate lab and imaging results for this patient.Lily Kocher, PA-C 07/05/15 Disney, PA-C 07/05/15 [place],  MD 07/06/15 [phone]

## 2015-07-05 NOTE — Discharge Instructions (Signed)
The CT scan of your head and neck are negative for acute problem. The x-ray of your finger shows some swelling, but no fracture or dislocation. Please use caution changing positions, and please stand until you're stated before beginning to take your steps. Please use Antivert 3 times daily for dizziness. This medication may cause drowsiness, please use with caution. May use Tylenol for mild soreness, use Norco for more severe soreness. Norco may cause drowsiness, please do not drive, operate machinery, drink alcohol, or crepitus. And activities requiring concentration when taking this medication. Benign Positional Vertigo Vertigo is the feeling that you or your surroundings are moving when they are not. Benign positional vertigo is the most common form of vertigo. The cause of this condition is not serious (is benign). This condition is triggered by certain movements and positions (is positional). This condition can be dangerous if it occurs while you are doing something that could endanger you or others, such as driving.  CAUSES In many cases, the cause of this condition is not known. It may be caused by a disturbance in an area of the inner ear that helps your brain to sense movement and balance. This disturbance can be caused by a viral infection (labyrinthitis), head injury, or repetitive motion. RISK FACTORS This condition is more likely to develop in:  Women.  People who are 82 years of age or older. SYMPTOMS Symptoms of this condition usually happen when you move your head or your eyes in different directions. Symptoms may start suddenly, and they usually last for less than a minute. Symptoms may include:  Loss of balance and falling.  Feeling like you are spinning or moving.  Feeling like your surroundings are spinning or moving.  Nausea and vomiting.  Blurred vision.  Dizziness.  Involuntary eye movement (nystagmus). Symptoms can be mild and cause only slight annoyance, or they  can be severe and interfere with daily life. Episodes of benign positional vertigo may return (recur) over time, and they may be triggered by certain movements. Symptoms may improve over time. DIAGNOSIS This condition is usually diagnosed by medical history and a physical exam of the head, neck, and ears. You may be referred to a health care provider who specializes in ear, nose, and throat (ENT) problems (otolaryngologist) or a provider who specializes in disorders of the nervous system (neurologist). You may have additional testing, including:  MRI.  A CT scan.  Eye movement tests. Your health care provider may ask you to change positions quickly while he or she watches you for symptoms of benign positional vertigo, such as nystagmus. Eye movement may be tested with an electronystagmogram (ENG), caloric stimulation, the Dix-Hallpike test, or the roll test.  An electroencephalogram (EEG). This records electrical activity in your brain.  Hearing tests. TREATMENT Usually, your health care provider will treat this by moving your head in specific positions to adjust your inner ear back to normal. Surgery may be needed in severe cases, but this is rare. In some cases, benign positional vertigo may resolve on its own in 2-4 weeks. HOME CARE INSTRUCTIONS Safety  Move slowly.Avoid sudden body or head movements.  Avoid driving.  Avoid operating heavy machinery.  Avoid doing any tasks that would be dangerous to you or others if a vertigo episode would occur.  If you have trouble walking or keeping your balance, try using a cane for stability. If you feel dizzy or unstable, sit down right away.  Return to your normal activities as told by your  health care provider. Ask your health care provider what activities are safe for you. General Instructions  Take over-the-counter and prescription medicines only as told by your health care provider.  Avoid certain positions or movements as told by your  health care provider.  Drink enough fluid to keep your urine clear or pale yellow.  Keep all follow-up visits as told by your health care provider. This is important. SEEK MEDICAL CARE IF:  You have a fever.  Your condition gets worse or you develop new symptoms.  Your family or friends notice any behavioral changes.  Your nausea or vomiting gets worse.  You have numbness or a "pins and needles" sensation. SEEK IMMEDIATE MEDICAL CARE IF:  You have difficulty speaking or moving.  You are always dizzy.  You faint.  You develop severe headaches.  You have weakness in your legs or arms.  You have changes in your hearing or vision.  You develop a stiff neck.  You develop sensitivity to light.   This information is not intended to replace advice given to you by your health care provider. Make sure you discuss any questions you have with your health care provider.   Document Released: 04/05/2006 Document Revised: 03/19/2015 Document Reviewed: 10/21/2014 Elsevier Interactive Patient Education 2016 [employer].  Facial or Scalp Contusion  A facial or scalp contusion is a deep bruise on the face or head. Contusions happen when an injury causes bleeding under the skin. Signs of bruising include pain, puffiness (swelling), and discolored skin. The contusion may turn blue, purple, or yellow. HOME CARE  Only take medicines as told by your doctor.  Put ice on the injured area.  Put ice in a plastic bag.  Place a towel between your skin and the bag.  Leave the ice on for 20 minutes, 2-3 times a day. GET HELP IF:  You have bite problems.  You have pain when chewing.  You are worried about your face not healing normally. GET HELP RIGHT AWAY IF:   You have severe pain or a headache and medicine does not help.  You are very tired or confused, or your personality changes.  You throw up (vomit).  You have a nosebleed that will not stop.  You see two of everything  (double vision) or have blurry vision.  You have fluid coming from your nose or ear.  You have problems walking or using your arms or legs. MAKE SURE YOU:   Understand these instructions.  Will watch your condition.  Will get help right away if you are not doing well or get worse.   This information is not intended to replace advice given to you by your health care provider. Make sure you discuss any questions you have with your health care provider.   Document Released: 06/17/2011 Document Revised: 07/19/2014 Document Reviewed: 02/08/2013 Elsevier Interactive Patient Education [employer].

## 2015-08-15 ENCOUNTER — Telehealth: Payer: Self-pay | Admitting: Family Medicine

## 2015-08-15 NOTE — Telephone Encounter (Signed)
Dr. Nicki Reaper patient wrote and mailed you a letter inquiring about a facility you gave her the information to a couple of months ago where she could work for a couple of hours.  Please advise.  This is located in red folder on your side at the nurse station.

## 2015-08-20 ENCOUNTER — Other Ambulatory Visit: Payer: Self-pay

## 2015-08-20 DIAGNOSIS — Z1231 Encounter for screening mammogram for malignant neoplasm of breast: Secondary | ICD-10-CM

## 2015-08-20 NOTE — Telephone Encounter (Signed)
I have done the best I can to try to recall which organization that she could look into regarding part-time work at the moment this is escaping me, unfortunately at the moment I don't have any great recommendations I will keep my eye peeled in regards to this

## 2015-09-02 ENCOUNTER — Ambulatory Visit
Admission: RE | Admit: 2015-09-02 | Discharge: 2015-09-02 | Disposition: A | Discharge status: Home / Self Care | Payer: BLUE CROSS/BLUE SHIELD | Source: Ambulatory Visit

## 2015-09-02 DIAGNOSIS — Z1231 Encounter for screening mammogram for malignant neoplasm of breast: Secondary | ICD-10-CM

## 2015-09-18 NOTE — Telephone Encounter (Signed)
Patient was notified on voicemail.

## 2015-10-20 ENCOUNTER — Ambulatory Visit (INDEPENDENT_AMBULATORY_CARE_PROVIDER_SITE_OTHER): Payer: PPO | Admitting: Family Medicine

## 2015-10-20 ENCOUNTER — Encounter: Payer: Self-pay | Admitting: Family Medicine

## 2015-10-20 VITALS — BP 122/72 | Temp 97.8°F | Ht 64.0 in | Wt 261.5 lb

## 2015-10-20 DIAGNOSIS — R002 Palpitations: Secondary | ICD-10-CM

## 2015-10-20 DIAGNOSIS — R5383 Other fatigue: Secondary | ICD-10-CM | POA: Diagnosis not present

## 2015-10-20 DIAGNOSIS — R001 Bradycardia, unspecified: Secondary | ICD-10-CM | POA: Diagnosis not present

## 2015-10-20 NOTE — Progress Notes (Signed)
   Subjective:    Patient ID: [name], female    DOB: [birth year], 65 y.o.   MRN: [id number]  Palpitations  This is a new problem. The current episode started in the past 7 days. Associated symptoms include dizziness. Treatments tried: Xanax, Meclizine   Patient denies taking any beta blockers. Denies abusing any medicines. States she's had significant fatigue and tiredness. In addition to this visit fatigue and tiredness been present on past 3-4 days would low energy. Finds herself napping more. States she had sleep study years ago which was normal. Denies excessive thirst urination denies blurred vision denies chest tightness pressure pain or shortness of breath.  Patient states no other concerns this visit.  Review of Systems  Cardiovascular: Positive for palpitations.  Neurological: Positive for dizziness.       Objective:   Physical Exam HEENT benign neck no masses lungs are clear no crackles heart is regular patient has good blood pressure sitting with minimal drop standing Current EKG shows sinus bradycardia. No ischemia. EKGs from the past several years were looked at her heart rate was consistently in the mid to upper 70s.     Assessment & Plan:  Patient with significant bradycardia and fatigue. Could have sick sinus syndrome. Some of the palpitations she is feeling could be escape beats. We will check thyroid and will also be referring patient to cardiology. Patient may end up needing to have pacemaker. 25 minutes spent with the patient evaluating her bradycardia blood pressure vascular stability as well as checking EKG and discussing with her cardiology referral and possible pacemaker

## 2015-10-21 ENCOUNTER — Encounter: Payer: Self-pay | Admitting: Family Medicine

## 2015-10-21 ENCOUNTER — Telehealth: Payer: Self-pay | Admitting: Family Medicine

## 2015-10-21 LAB — T4, FREE: Free T4: 0.92 ng/dL (ref 0.82–1.77)

## 2015-10-21 LAB — TSH: TSH: 2.04 u[IU]/mL (ref 0.450–4.500)

## 2015-10-21 LAB — T3 UPTAKE: T3 UPTAKE RATIO: 27 % (ref 24–39)

## 2015-10-21 NOTE — Telephone Encounter (Signed)
Thyroid testing was normal. I did speak with cardiology. They will be seen the patient tomorrow. Patient's EKG was faxed to the [facility] cardiology office. Showed normal sinus bradycardia. No junctional rhythm. Patient was spoken with understands about her appointment

## 2015-10-21 NOTE — Telephone Encounter (Signed)
Patient says she saw Dr. Nicki Reaper yesterday and was told that she was going to be set up to see cardiology today but she hasn't heard anything.  There is a referral in the system, but it doesn't say anything about her needing to be seen today.  Was she supposed to be seen today by cardiology?

## 2015-10-21 NOTE — Telephone Encounter (Signed)
Nurse's-the patient misunderstood. I informed her that we would be connecting with cardiology today. Then we would call her with what they recommended. We are trying to set up the appointment but we are at [facility]. Please let me speak with cardiologist or PA regarding this patient-patient was seen here yesterday for significant bradycardia and fatigue

## 2015-10-21 NOTE — Telephone Encounter (Signed)
Patient notified

## 2015-10-21 NOTE — Telephone Encounter (Signed)
Called patient and explained that we are awaiting a call back from cardiologist

## 2015-10-21 NOTE — Telephone Encounter (Signed)
Per Dr Nicki Reaper: He spoke with Cardiology and they will be calling her for an appt this week in one of their offices

## 2015-10-22 ENCOUNTER — Ambulatory Visit (INDEPENDENT_AMBULATORY_CARE_PROVIDER_SITE_OTHER): Payer: PPO | Admitting: Cardiology

## 2015-10-22 ENCOUNTER — Encounter (HOSPITAL_COMMUNITY): Payer: Self-pay

## 2015-10-22 ENCOUNTER — Encounter: Payer: Self-pay | Admitting: Cardiology

## 2015-10-22 ENCOUNTER — Observation Stay (HOSPITAL_COMMUNITY)
Admission: EM | Admit: 2015-10-22 | Discharge: 2015-10-23 | Disposition: A | Discharge status: Home / Self Care | Payer: PPO | Attending: Internal Medicine | Admitting: Internal Medicine

## 2015-10-22 VITALS — BP 126/76 | HR 76 | Ht 64.0 in | Wt 249.0 lb

## 2015-10-22 DIAGNOSIS — M1990 Unspecified osteoarthritis, unspecified site: Secondary | ICD-10-CM | POA: Insufficient documentation

## 2015-10-22 DIAGNOSIS — F319 Bipolar disorder, unspecified: Secondary | ICD-10-CM

## 2015-10-22 DIAGNOSIS — F329 Major depressive disorder, single episode, unspecified: Secondary | ICD-10-CM | POA: Insufficient documentation

## 2015-10-22 DIAGNOSIS — R001 Bradycardia, unspecified: Secondary | ICD-10-CM

## 2015-10-22 DIAGNOSIS — R42 Dizziness and giddiness: Principal | ICD-10-CM | POA: Insufficient documentation

## 2015-10-22 DIAGNOSIS — F419 Anxiety disorder, unspecified: Secondary | ICD-10-CM | POA: Diagnosis not present

## 2015-10-22 DIAGNOSIS — Z853 Personal history of malignant neoplasm of breast: Secondary | ICD-10-CM | POA: Diagnosis not present

## 2015-10-22 DIAGNOSIS — I10 Essential (primary) hypertension: Secondary | ICD-10-CM | POA: Insufficient documentation

## 2015-10-22 DIAGNOSIS — R55 Syncope and collapse: Secondary | ICD-10-CM | POA: Diagnosis not present

## 2015-10-22 DIAGNOSIS — F3170 Bipolar disorder, currently in remission, most recent episode unspecified: Secondary | ICD-10-CM | POA: Diagnosis not present

## 2015-10-22 DIAGNOSIS — E669 Obesity, unspecified: Secondary | ICD-10-CM | POA: Diagnosis not present

## 2015-10-22 LAB — CBC WITH DIFFERENTIAL/PLATELET
Basophils Absolute: 0 10*3/uL (ref 0.0–0.1)
Basophils Relative: 0 %
EOS PCT: 2 %
Eosinophils Absolute: 0.1 10*3/uL (ref 0.0–0.7)
HCT: 42.1 % (ref 36.0–46.0)
HEMOGLOBIN: 13.5 g/dL (ref 12.0–15.0)
LYMPHS ABS: 2.4 10*3/uL (ref 0.7–4.0)
LYMPHS PCT: 38 %
MCH: 30.5 pg (ref 26.0–34.0)
MCHC: 32.1 g/dL (ref 30.0–36.0)
MCV: 95 fL (ref 78.0–100.0)
Monocytes Absolute: 0.4 10*3/uL (ref 0.1–1.0)
Monocytes Relative: 7 %
Neutro Abs: 3.2 10*3/uL (ref 1.7–7.7)
Neutrophils Relative %: 53 %
PLATELETS: 295 10*3/uL (ref 150–400)
RBC: 4.43 MIL/uL (ref 3.87–5.11)
RDW: 13.2 % (ref 11.5–15.5)
WBC: 6.1 10*3/uL (ref 4.0–10.5)

## 2015-10-22 LAB — BASIC METABOLIC PANEL
Anion gap: 10 (ref 5–15)
BUN: 18 mg/dL (ref 6–20)
CHLORIDE: 102 mmol/L (ref 101–111)
CO2: 27 mmol/L (ref 22–32)
Calcium: 9.3 mg/dL (ref 8.9–10.3)
Creatinine, Ser: 0.68 mg/dL (ref 0.44–1.00)
GFR calc Af Amer: >60 mL/min (ref >60)
GFR calc non Af Amer: >60 mL/min (ref >60)
Glucose, Bld: 100 mg/dL — ABNORMAL HIGH (ref 65–99)
POTASSIUM: 4 mmol/L (ref 3.5–5.1)
Sodium: 139 mmol/L (ref 135–145)

## 2015-10-22 LAB — TROPONIN I

## 2015-10-22 MED ORDER — SODIUM CHLORIDE 0.45 % IV SOLN
INTRAVENOUS | Status: DC
  Administered 2015-10-22: 20:00:00 via INTRAVENOUS

## 2015-10-22 MED ORDER — ACETAMINOPHEN 650 MG RE SUPP: 650.0000 mg | Freq: Four times a day (QID) | RECTAL | Status: DC | PRN

## 2015-10-22 MED ORDER — VENLAFAXINE HCL 37.5 MG PO TABS
37.5000 mg | ORAL_TABLET | Freq: Every morning | ORAL | Status: DC
  Administered 2015-10-23: 37.5 mg via ORAL
  Filled 2015-10-22: qty 1

## 2015-10-22 MED ORDER — SODIUM CHLORIDE 0.9% FLUSH
3.0000 mL | Freq: Two times a day (BID) | INTRAVENOUS | Status: DC
  Administered 2015-10-22: 3 mL via INTRAVENOUS

## 2015-10-22 MED ORDER — ALPRAZOLAM 1 MG PO TABS
1.0000 mg | ORAL_TABLET | Freq: Every evening | ORAL | Status: DC | PRN
  Administered 2015-10-22: 1 mg via ORAL
  Filled 2015-10-22: qty 1

## 2015-10-22 MED ORDER — ONDANSETRON HCL 4 MG/2ML IJ SOLN: 4.0000 mg | Freq: Four times a day (QID) | INTRAMUSCULAR | Status: DC | PRN

## 2015-10-22 MED ORDER — ACETAMINOPHEN 325 MG PO TABS: 650.0000 mg | ORAL_TABLET | Freq: Four times a day (QID) | ORAL | Status: DC | PRN

## 2015-10-22 MED ORDER — HYDROCODONE-ACETAMINOPHEN 5-325 MG PO TABS: 1.0000 | ORAL_TABLET | ORAL | Status: DC | PRN

## 2015-10-22 MED ORDER — ONDANSETRON HCL 4 MG PO TABS: 4.0000 mg | ORAL_TABLET | Freq: Four times a day (QID) | ORAL | Status: DC | PRN

## 2015-10-22 MED ORDER — ENOXAPARIN SODIUM 40 MG/0.4ML ~~LOC~~ SOLN
40.0000 mg | SUBCUTANEOUS | Status: DC
  Administered 2015-10-22: 40 mg via SUBCUTANEOUS
  Filled 2015-10-22: qty 0.4

## 2015-10-22 MED ORDER — ALPRAZOLAM ER 1 MG PO TB24: 1.0000 mg | ORAL_TABLET | Freq: Two times a day (BID) | ORAL | Status: DC

## 2015-10-22 MED ORDER — LAMOTRIGINE 100 MG PO TABS
400.0000 mg | ORAL_TABLET | Freq: Every day | ORAL | Status: DC
  Administered 2015-10-22: 400 mg via ORAL
  Filled 2015-10-22: qty 4

## 2015-10-22 MED ORDER — QUETIAPINE FUMARATE 100 MG PO TABS
400.0000 mg | ORAL_TABLET | Freq: Every day | ORAL | Status: DC
  Administered 2015-10-22: 400 mg via ORAL
  Filled 2015-10-22: qty 4

## 2015-10-22 MED ORDER — SODIUM CHLORIDE 0.9 % IV BOLUS (SEPSIS)
500.0000 mL | Freq: Once | INTRAVENOUS | Status: AC
  Administered 2015-10-22: 500 mL via INTRAVENOUS

## 2015-10-22 MED ORDER — GEMFIBROZIL 600 MG PO TABS
600.0000 mg | ORAL_TABLET | Freq: Two times a day (BID) | ORAL | Status: DC
  Administered 2015-10-23: 600 mg via ORAL
  Filled 2015-10-22: qty 1

## 2015-10-22 NOTE — ED Notes (Signed)
Pt reports has been having some dizzy spells and weakness for past few weeks.  Saw Dr. Wolfgang Phoenix Monday and HR was 34.  Reports Dr. Wolfgang Phoenix checked some labs and set up a follow up appt with cardiology.  Today pt was in the cardiology office and HR was 64.  They were wanting to admit pt overnight to watch her but then started feeling dizzy and nauseated and weak so they sent her to the ER for eval.

## 2015-10-22 NOTE — Progress Notes (Signed)
Patient has xanax-xr ordered, we don't stock this medication. Paged the on call MD, will follow new orders received and continue to monitor the patient.

## 2015-10-22 NOTE — H&P (Signed)
[facility] History and Physical  [name] [id number] DOB: May 08, 1951 DOA: 10/22/2015  Referring physician: Dr. Lita Mains PCP: [name], MD   Chief Complaint: Dizziness/ pre-syncope  HPI: [name] is a 65 y.o. female with hx of bipolar d/o, anxiety, prior breast cancer 2013, IBS/ OSA, HTN who presenting with recent onset dizziness and pre-syncope episodes for the last week or so.  Was in PCP's office on Monday 2 days ago and had bradycardia with HR 30's.  Cardiology appt made for today but today the heart rate was around 60.  Cardiologist suggested OBS admit overnight to observe on telemetry.  We are asked to admit for this reason.   Patient has bipolar on same meds for many yrs per patient (lamictal/ xanax/ effexor/ seroquel) and also takes Lopid.  No recent medication changes.  Has had some nausea the past few days associated with dizziness, but no abd pain or vomiting.  NO recent illness/ flu/ URI/ diarrhea.  No family members sick, no fevers.  Eating OK.  No BP meds.  No OTC meds.  NOfocal paralysis/ numbness, no visual loss or HA's. NO room spinning or pt spinning sensations. She did fall once and hit her head then felt vertigo for a day or two after that but that was not recently.    Daughter is here with patient, patient gives all the history though.  Daughter is a Pharmacist, hospital , works in [employer].  Pt lives in [place].  No etoh , tobacco, never smoked.  Was a Marine scientist.     Chart review: 2002 - depression / SI, psych admission 2004 - depression, psych admission 2008 - L knee TKR Aug 2012 - R knee TKR  ROS  denies CP  no joint pain   no HA  no blurry vision  no rash  no diarrhea  no nausea/ vomiting  no dysuria  no difficulty voiding  no change in urine color    Where does patient live home Can patient participate in ADLs? yes  Past Medical History  Past Medical History  Diagnosis Date  . Bipolar 1 disorder ([place])   . Anxiety   . Cancer [facility]) breast  cancer 7 yrs ago  . High cholesterol   . Breast cancer, left breast ([place]) 07/22/2011  . Depression   . OA (osteoarthritis)   . Obesity   . IBS (irritable bowel syndrome)   . Polycystic ovarian disease   . Sleep apnea   . Impaired fasting glucose   . Hypertension    Past Surgical History  Past Surgical History  Procedure Laterality Date  . Total knee arthroplasty Bilateral right knee    2012, 2007  . Cholecystectomy    . Abdominal hysterectomy    . Breast lumpectomy Left 2008  . Appendectomy    . Colonoscopy N/A 09/13/2013    Procedure: COLONOSCOPY;  Surgeon: [name], MD;  Location: [facility];  Service: Endoscopy;  Laterality: N/A;  730  . Esophagogastroduodenoscopy N/A 01/17/2014    Procedure: ESOPHAGOGASTRODUODENOSCOPY (EGD);  Surgeon: [name], MD;  Location: [facility];  Service: Endoscopy;  Laterality: N/A;  75   Family History  Family History  Problem Relation Age of Onset  . Bipolar disorder Mother   . Diabetes Mother   . Bipolar disorder Sister   . Diabetes Sister   . Alcohol abuse Father   . Hypertension Father   . Heart disease Other    Social History  reports that she has  never smoked. She has never used smokeless tobacco. She reports that she does not drink alcohol or use illicit drugs. Allergies  Allergies  Allergen Reactions  . Imipramine Hives and Rash  . Tricor [Fenofibrate] Other (See Comments)    Pain, "aching"   Home medications Prior to Admission medications   Medication Sig Start Date End Date Taking? Authorizing Provider  acetaminophen (TYLENOL 8 HOUR) 650 MG CR tablet Take 650 mg by mouth every morning.   Yes Historical Provider, MD  ALPRAZolam (XANAX XR) 1 MG 24 hr tablet Take 1 mg by mouth 2 (two) times daily.   Yes Historical Provider, MD  ALPRAZolam [name]) 1 MG tablet Take 1 mg by mouth 3 (three) times daily as needed for anxiety.  08/06/14  Yes Historical Provider, MD  gemfibrozil (LOPID) 600 MG tablet TAKE 1 TABLET (600  MG TOTAL) BY MOUTH 2 (TWO) TIMES DAILY. Patient taking differently: TAKE 1 TABLET (600 MG TOTAL) BY MOUTH DAILY IN THE MORNING 09/16/14  Yes Kathyrn Drown, MD  glucosamine-chondroitin 500-400 MG tablet Take 2 tablets by mouth daily.   Yes Historical Provider, MD  lamoTRIgine (LAMICTAL) 200 MG tablet Take 400 mg by mouth at bedtime.    Yes Historical Provider, MD  meclizine (ANTIVERT) 25 MG tablet Take 1 tablet (25 mg total) by mouth 3 (three) times daily as needed for dizziness. 07/05/15  Yes Lily Kocher, PA-C  QUEtiapine (SEROQUEL) 200 MG tablet Take 400 mg by mouth at bedtime. 05/07/13  Yes [name], MD  venlafaxine (EFFEXOR) 37.5 MG tablet Take 37.5 mg by mouth daily. TAKES ONE-HALF TABLET ONLY IN THE MORNING   Yes Historical Provider, MD  Vitamin D, Ergocalciferol, (DRISDOL) 50000 UNITS CAPS capsule Take 50,000 Units by mouth every 7 (seven) days.   Yes Historical Provider, MD   Liver Function Tests No results for input(s): AST, ALT, ALKPHOS, BILITOT, PROT, ALBUMIN in the last 168 hours. No results for input(s): LIPASE, AMYLASE in the last 168 hours. CBC  Recent Labs Lab 10/22/15 1502  WBC 6.1  NEUTROABS 3.2  HGB 13.5  HCT 42.1  MCV 95.0  PLT [id number]   Basic Metabolic Panel  Recent Labs Lab 10/22/15 1502  NA 139  K 4.0  CL 102  CO2 27  GLUCOSE 100*  BUN 18  CREATININE 0.68  CALCIUM 9.3     Filed Vitals:   10/22/15 1457 10/22/15 1530  BP: 157/66 141/76  Pulse: 61 58  Temp: 97.8 F (36.6 C)   TempSrc: Oral   Resp: 18 17  Height: 5\' 4"  (1.626 m)   Weight: 117.935 kg (260 lb)   SpO2: 97% 97%   Exam: VS: BP 157/66  HR 58 bpm  RR 15   RA  97% Gen alert, obese pleasant older adult female no distress No rash, cyanosis or gangrene Sclera anicteric, throat clear  No jvd or bruits Chest clear bilat RRR no MRG Abd soft ntnd no mass or ascites +bs GU deferred MS no joint effusions or deformity Ext no LE or UE edema / no wounds or ulcers Neuro is alert,  Ox 3 , nf, CN's intact, good strength x 4 ext   EKG (independently reviewed) > sinus bradycardia 58 bpm  Home medications > xanax 24hr, xanax prn, lopid, lamictal, antivert prn, seroquel, effexor, vit D  Na 139 K 4.0  BUn 18  Creat 0.68   Ca 9.3  WBC 6k  Hb 13.5 plt 295 TSH 2.040  Free T4 0.92  Assessment: 1. Lightheadness / presyncope - recent bradycardic episode in PCP's office on Monday. Today borderline bradycardia, seen by cardiology in clinic today, rec'd admission overnight. .  Plan admit, IVF's, telemetry, ECHO in am.  Cardiology f/u and rec's anticipated. Check orthostatics.  Cont all home meds 2. Bipolar disorder 3. Anxiety  Plan - as above   DVT Prophylaxis lovenox  Code Status: full  Family Communication: daughter here  Disposition Plan: home when stable    Sol Blazing [facility] Pager [phone]  Cell [phone]  If 7PM-7AM, please contact night-coverage [email or web address] Password [facility] 10/22/2015, 6:31 PM

## 2015-10-22 NOTE — ED Provider Notes (Signed)
CSN: [id number]     Arrival date & time 10/22/15  1450 History   First MD Initiated Contact with Patient 10/22/15 1537     Chief Complaint  Patient presents with  . Bradycardia     (Consider location/radiation/quality/duration/timing/severity/associated sxs/prior Treatment) HPI Patient presents with lightheadedness over the last few weeks. Associated with standing or sitting up. She's been evaluated for this by her cardiologist and was concerned this may be symptomatically bradycardia. Heart rate had dropped into the 30's previously and pacemaker was discussed. Was seen by cardiologist today and patient had episode of lightheadedness, nausea and generalized weakness. Referred to the emergency department for evaluation and likely observation admission. Patient currently is denying any lightheadedness. States she feels generalized fatigue but denies any specific pain specifically denies chest pain, abdominal pain or headache. No shortness of breath or cough. No recent fever or chills. No vomiting or diarrhea. Denies urinary symptoms. Patient states she had labs including thyroid studies done early in the week by primary doctor which were normal. Past Medical History  Diagnosis Date  . Bipolar 1 disorder ([place])   . Anxiety   . Cancer [facility]) breast cancer 7 yrs ago  . High cholesterol   . Breast cancer, left breast ([place]) 07/22/2011  . Depression   . OA (osteoarthritis)   . Obesity   . IBS (irritable bowel syndrome)   . Polycystic ovarian disease   . Sleep apnea   . Impaired fasting glucose   . Hypertension    Past Surgical History  Procedure Laterality Date  . Total knee arthroplasty Bilateral right knee    2012, 2007  . Cholecystectomy    . Abdominal hysterectomy    . Breast lumpectomy Left 2008  . Appendectomy    . Colonoscopy N/A 09/13/2013    Procedure: COLONOSCOPY;  Surgeon: [name], MD;  [facility];  Service: Endoscopy;  Laterality: N/A;  730  .  Esophagogastroduodenoscopy N/A 01/17/2014    Procedure: ESOPHAGOGASTRODUODENOSCOPY (EGD);  Surgeon: [name], MD;  [facility];  Service: Endoscopy;  Laterality: N/A;  245   Family History  Problem Relation Age of Onset  . Bipolar disorder Mother   . Diabetes Mother   . Bipolar disorder Sister   . Diabetes Sister   . Alcohol abuse Father   . Hypertension Father   . Heart disease Other    Social History  Substance Use Topics  . Smoking status: Never Smoker   . Smokeless tobacco: Never Used  . Alcohol Use: No   OB History    No data available     Review of Systems  Constitutional: Positive for fatigue. Negative for fever and chills.  Respiratory: Negative for cough and shortness of breath.   Cardiovascular: Negative for chest pain.  Gastrointestinal: Positive for nausea. Negative for vomiting and abdominal pain.  Genitourinary: Negative for dysuria, frequency, hematuria and flank pain.  Musculoskeletal: Negative for myalgias, back pain and neck pain.  Skin: Negative for rash and wound.  Neurological: Positive for dizziness, weakness (generalized) and light-headedness. Negative for syncope, numbness and headaches.  All other systems reviewed and are negative.     Allergies  Imipramine and Tricor  Home Medications   Prior to Admission medications   Medication Sig Start Date End Date Taking? Authorizing Provider  Acetaminophen (TYLENOL PO) Take by mouth. ER 2 daily    Historical Provider, MD  ALPRAZolam (XANAX XR) 1 MG 24 hr tablet Take 1 mg by mouth 2 (two)  times daily.    Historical Provider, MD  ALPRAZolam [name]) 1 MG tablet Take 1 mg by mouth 3 (three) times daily. 08/06/14   Historical Provider, MD  gemfibrozil (LOPID) 600 MG tablet TAKE 1 TABLET (600 MG TOTAL) BY MOUTH 2 (TWO) TIMES DAILY. 09/16/14   Kathyrn Drown, MD  glucosamine-chondroitin 500-400 MG tablet Take 2 tablets by mouth daily.    Historical Provider, MD  lamoTRIgine (LAMICTAL) 200 MG  tablet Take 400 mg by mouth at bedtime.     Historical Provider, MD  meclizine (ANTIVERT) 25 MG tablet Take 1 tablet (25 mg total) by mouth 3 (three) times daily as needed for dizziness. 07/05/15   Lily Kocher, PA-C  Multiple Vitamin (MULTIVITAMIN WITH MINERALS) TABS tablet Take 1 tablet by mouth daily. Reported on 10/20/2015    Historical Provider, MD  olopatadine (PATANOL) 0.1 % ophthalmic solution Place 1 drop into both eyes 2 (two) times daily. 06/26/15   Kathyrn Drown, MD  QUEtiapine (SEROQUEL) 200 MG tablet Take 400 mg by mouth at bedtime. 05/07/13   [name], MD  venlafaxine (EFFEXOR) 37.5 MG tablet Take 37.5 mg by mouth daily.    Historical Provider, MD  Vitamin D, Ergocalciferol, (DRISDOL) 50000 UNITS CAPS capsule Take 50,000 Units by mouth every 7 (seven) days.    Historical Provider, MD   BP 141/76 mmHg  Pulse 58  Temp(Src) 97.8 F (36.6 C) (Oral)  Resp 17  Ht 5\' 4"  (1.626 m)  Wt 260 lb (117.935 kg)  BMI 44.61 kg/m2  SpO2 97% Physical Exam  Constitutional: She is oriented to person, place, and time. She appears well-developed and well-nourished. No distress.  HENT:  Head: Normocephalic and atraumatic.  Mouth/Throat: Oropharynx is clear and moist. No oropharyngeal exudate.  Eyes: EOM are normal. Pupils are equal, round, and reactive to light.  No nystagmus noted  Neck: Normal range of motion. Neck supple. No JVD present.  Cardiovascular: Normal rate and regular rhythm.  Exam reveals no gallop and no friction rub.   No murmur heard. Pulmonary/Chest: Effort normal and breath sounds normal. No respiratory distress. She has no wheezes. She has no rales. She exhibits no tenderness.  Abdominal: Soft. Bowel sounds are normal. She exhibits no distension and no mass. There is no tenderness. There is no rebound and no guarding.  Musculoskeletal: Normal range of motion. She exhibits no edema or tenderness.  No lower extremity swelling, asymmetry or tenderness. Distal pulses  are equal and intact.  Neurological: She is alert and oriented to person, place, and time.  5/5 motor in all extremities. Bilateral finger-to-nose intact. Sensation fully intact. Patient gets lightheaded when sitting upright.  Skin: Skin is warm and dry. No rash noted. No erythema.  Psychiatric: She has a normal mood and affect. Her behavior is normal.  Nursing note and vitals reviewed.   ED Course  Procedures (including critical care time) Labs Review Labs Reviewed  BASIC METABOLIC PANEL - Abnormal; Notable for the following:    Glucose, Bld 100 (*)    All other components within normal limits  CBC WITH DIFFERENTIAL/PLATELET  TROPONIN I  URINALYSIS, ROUTINE W REFLEX MICROSCOPIC (NOT AT [facility])    Imaging Review No results found. I have personally reviewed and evaluated these images and lab results as part of my medical decision-making.   EKG Interpretation   Date/Time:  Wednesday October 22 2015 14:53:25 EDT Ventricular Rate:  58 PR Interval:  204 QRS Duration: 90 QT Interval:  430 QTC Calculation: 422 R  Axis:   -23 Text Interpretation:  Sinus bradycardia Minimal voltage criteria for LVH,  may be normal variant Borderline ECG Confirmed by Lita Mains  MD, Jaben Benegas  ([id number]) on 10/22/2015 4:04:21 PM      MDM   Final diagnoses:  Episodic lightheadedness  Bradycardia   Discussed with [facility] and will admit to telemetry observation bed.     Julianne Rice, MD 10/22/15 1719

## 2015-10-22 NOTE — Progress Notes (Signed)
Patient ID: [name], female   DOB: 11-May-1951, 65 y.o.   MRN: [id number]   Patient seen and discussed with NP Dorene Ar. Recent episodes of dizziness and presyncope, most recent episode while walking yesterday. Seen by Dr Wolfgang Phoenix, EKG at that time showed sinus brady in high 30s. Heart rate today is is 70s. Unclear if symptoms are heart rate related or not. She had an episode of severe lightheadness and dizziness while sitting in the office, vitals at that time heart rate 60 and bp 140/90. Unclear at this time if heart rates truly are related to her symptoms. We will send her to the ER for evaluation, would recommend observation overnight with telemetry monitoring.   Zandra Abts MD

## 2015-10-22 NOTE — ED Notes (Signed)
Up ambulatory to the bathroom without difficulty. Denies any dizziness at present time.

## 2015-10-22 NOTE — Progress Notes (Signed)
Cardiology Office Note   Date:  10/22/2015   ID:  [name], DOB 14-Apr-1951, MRN [id number]  PCP:  [name], MD  Cardiologist:  Dr. Jacinta Shoe    Chief Complaint  Patient presents with  . Fatigue    weakness , lighheaded at times.      History of Present Illness: [name] is a 65 y.o. female who presents for bradycardia.   She has a prior history of breast cancer status post lumpectomy, bipolar disorder and anxiety, hypertriglyceridemia, morbid obesity, and hypertension who has been referred for the evaluation of bradycardia and lightheadedness, weakness and yesterday off balance and dizzy fell into wall.  With EKG at PCP office she was in [place] at 37.   Interestingly she was in ambulance in 2015 and something fell on her head and she had junctional rhythm for short period of time.  Echo and nuc study at that time were normal.  She has no chest pain.  She is on no meds to slow the heart.     Past Medical History  Diagnosis Date  . Bipolar 1 disorder ([place])   . Anxiety   . Cancer [facility]) breast cancer 7 yrs ago  . High cholesterol   . Breast cancer, left breast ([place]) 07/22/2011  . Depression   . OA (osteoarthritis)   . Obesity   . IBS (irritable bowel syndrome)   . Polycystic ovarian disease   . Sleep apnea   . Impaired fasting glucose   . Hypertension     Past Surgical History  Procedure Laterality Date  . Total knee arthroplasty Bilateral right knee    2012, 2007  . Cholecystectomy    . Abdominal hysterectomy    . Breast lumpectomy Left 2008  . Appendectomy    . Colonoscopy N/A 09/13/2013    Procedure: COLONOSCOPY;  Surgeon: [name], MD;  Location: [facility];  Service: Endoscopy;  Laterality: N/A;  730  . Esophagogastroduodenoscopy N/A 01/17/2014    Procedure: ESOPHAGOGASTRODUODENOSCOPY (EGD);  Surgeon: [name], MD;  Location: [facility];  Service: Endoscopy;  Laterality: N/A;  245     Current Outpatient Prescriptions    Medication Sig Dispense Refill  . Acetaminophen (TYLENOL PO) Take by mouth. ER 2 daily    . ALPRAZolam (XANAX XR) 1 MG 24 hr tablet Take 1 mg by mouth 2 (two) times daily.    [name] ALPRAZolam (XANAX) 1 MG tablet Take 1 mg by mouth 3 (three) times daily.  0  . gemfibrozil (LOPID) 600 MG tablet TAKE 1 TABLET (600 MG TOTAL) BY MOUTH 2 (TWO) TIMES DAILY. 180 tablet 1  . glucosamine-chondroitin 500-400 MG tablet Take 2 tablets by mouth daily.    [name] lamoTRIgine (LAMICTAL) 200 MG tablet Take 400 mg by mouth at bedtime.     . meclizine (ANTIVERT) 25 MG tablet Take 1 tablet (25 mg total) by mouth 3 (three) times daily as needed for dizziness. 21 tablet 0  . Multiple Vitamin (MULTIVITAMIN WITH MINERALS) TABS tablet Take 1 tablet by mouth daily. Reported on 10/20/2015    . olopatadine (PATANOL) 0.1 % ophthalmic solution Place 1 drop into both eyes 2 (two) times daily. 5 mL 12  . QUEtiapine (SEROQUEL) 200 MG tablet Take 400 mg by mouth at bedtime.    [name] venlafaxine (EFFEXOR) 37.5 MG tablet Take 37.5 mg by mouth daily.    . Vitamin D, Ergocalciferol, (DRISDOL) 50000 UNITS CAPS capsule Take 50,000 Units by mouth  every 7 (seven) days.     No current facility-administered medications for this visit.    Allergies:   Imipramine and Tricor    Social History:  The patient  reports that she has never smoked. She has never used smokeless tobacco. She reports that she does not drink alcohol or use illicit drugs.   Family History:  The patient's family history includes Alcohol abuse in her father; Bipolar disorder in her mother and sister; Diabetes in her mother and sister; Heart disease in her other; Hypertension in her father.    ROS:  General:no colds or fevers, ? weight changes vs scales Skin:no rashes or ulcers HEENT:no blurred vision, no congestion CV:see HPI PUL:see HPI GI:no diarrhea constipation or melena, no indigestion GU:no hematuria, no dysuria MS:no joint pain, no claudication Neuro:no syncope, +  lightheadedness Endo:no diabetes, no thyroid disease  Wt Readings from Last 3 Encounters:  10/22/15 249 lb (112.946 kg)  10/20/15 261 lb 8 oz (118.616 kg)  07/05/15 260 lb (117.935 kg)     PHYSICAL EXAM: VS:  BP 126/76 mmHg  Pulse 76  Ht 5\' 4"  (1.626 m)  Wt 249 lb (112.946 kg)  BMI 42.72 kg/m2  SpO2 97% , BMI Body mass index is 42.72 kg/(m^2). General:Pleasant affect, though flat  NAD Skin:Warm and dry, brisk capillary refill HEENT:normocephalic, sclera clear, mucus membranes moist Neck:supple, no JVD, no bruits  Heart:S1S2 RRR without murmur, gallup, rub or click Lungs:clear without rales, rhonchi, or wheezes [id number], non tender, + BS, do not palpate liver spleen or masses Ext:no lower ext edema, 2+ pedal pulses, 2+ radial pulses Neuro:alert and oriented, MAE, follows commands, + facial symmetry    EKG:  EKG is NOT ordered today. The previous ekg ordered SB at 37   Recent Labs: 02/24/2015: ALT 14 07/05/2015: BUN 28*; Creatinine, Ser 0.61; Hemoglobin 13.5; Platelets 270; Potassium 4.4; Sodium 140 10/20/2015: TSH 2.040    Lipid Panel    Component Value Date/Time   CHOL 174 02/24/2015 0826   CHOL 142 08/22/2014 0828   TRIG 164* 02/24/2015 0826   HDL 42 02/24/2015 0826   HDL 43 08/22/2014 0828   CHOLHDL 4.1 02/24/2015 0826   CHOLHDL 3.3 08/22/2014 0828   VLDL 27 08/22/2014 0828   LDLCALC 99 02/24/2015 0826   LDLCALC 72 08/22/2014 0828       Other studies Reviewed: Additional studies/ records that were reviewed today include: .  Lexiscan myoview 12/2013 Low to intermediate risk abnormal Lexiscan Cardiolite. There were no  diagnostic ST segment abnormalities. Perfusion imaging shows  evidence of potential anterior wall ischemia in the LAD distribution  as outlined above, although breast attenuation is also evident and  may be contributing to this partially reversible zone. LVEF is  calculated at 86% with normal volumes and wall motion.   ECHO  12/2013: Study Conclusions  - Left ventricle: The cavity size was normal. Wall thickness was normal. Systolic function was vigorous. The estimated ejection fraction was in the range of 65% to 70%. Wall motion was normal; there were no regional wall motion abnormalities. Doppler parameters are consistent with abnormal left ventricular relaxation (grade 1 diastolic dysfunction). - Mitral valve: There was trivial regurgitation. - Pulmonary arteries: Systolic pressure could not be accurately estimated. - Inferior vena cava: Not visualized. Unable to estimate CVP. - Pericardium, extracardiac: There was no pericardial effusion.  Impressions:  - Normal LV wall thickness and chamber size with LVEF [id number], grade 1 diastolic dysfunction. No major valvular abnormalities. Unable to assess PASP.  No pericardial effusion. ASSESSMENT AND PLAN:  1.  Bradycardia symptomatic, today with HR 76.  Will admit to [facility] to monitor overnight per [facility] if HR is low and symptomatic requiring pacemaker she will be transported to [facility] for PPM.  If no brady then a home monitor.  On no meds tto slow.  Thyroid panel was normal.  2 HTN controlled   Current medicines are reviewed with the patient today.  The patient Has no concerns regarding medicines.  The following changes have been made:  See above Labs/ tests ordered today include:see above  Disposition:   FU:  see above  Signed, Isaiah Serge, NP  10/22/2015 2:25 PM    [facility] [place] [place], [place] Phone: [phone]; Fax: [phone]

## 2015-10-23 ENCOUNTER — Ambulatory Visit (INDEPENDENT_AMBULATORY_CARE_PROVIDER_SITE_OTHER): Payer: PPO

## 2015-10-23 ENCOUNTER — Observation Stay (HOSPITAL_BASED_OUTPATIENT_CLINIC_OR_DEPARTMENT_OTHER): Payer: PPO

## 2015-10-23 DIAGNOSIS — R55 Syncope and collapse: Secondary | ICD-10-CM | POA: Diagnosis not present

## 2015-10-23 DIAGNOSIS — R001 Bradycardia, unspecified: Secondary | ICD-10-CM

## 2015-10-23 DIAGNOSIS — F419 Anxiety disorder, unspecified: Secondary | ICD-10-CM | POA: Diagnosis not present

## 2015-10-23 LAB — ECHOCARDIOGRAM COMPLETE
HEIGHTINCHES: 64 in
Weight: 4148.8 oz

## 2015-10-23 LAB — URINALYSIS, ROUTINE W REFLEX MICROSCOPIC
BILIRUBIN URINE: NEGATIVE
Glucose, UA: NEGATIVE mg/dL
HGB URINE DIPSTICK: NEGATIVE
KETONES UR: NEGATIVE mg/dL
Leukocytes, UA: NEGATIVE
NITRITE: NEGATIVE
PROTEIN: NEGATIVE mg/dL
SPECIFIC GRAVITY, URINE: 1.01 (ref 1.005–1.030)
pH: 5.5 (ref 5.0–8.0)

## 2015-10-23 LAB — COMPREHENSIVE METABOLIC PANEL
ALK PHOS: 84 U/L (ref 38–126)
ALT: 17 U/L (ref 14–54)
ANION GAP: 9 (ref 5–15)
AST: 22 U/L (ref 15–41)
Albumin: 3.6 g/dL (ref 3.5–5.0)
BILIRUBIN TOTAL: 0.6 mg/dL (ref 0.3–1.2)
BUN: 15 mg/dL (ref 6–20)
CALCIUM: 8.7 mg/dL — AB (ref 8.9–10.3)
CO2: 27 mmol/L (ref 22–32)
CREATININE: 0.65 mg/dL (ref 0.44–1.00)
Chloride: 107 mmol/L (ref 101–111)
Glucose, Bld: 106 mg/dL — ABNORMAL HIGH (ref 65–99)
Potassium: 4.1 mmol/L (ref 3.5–5.1)
SODIUM: 143 mmol/L (ref 135–145)
TOTAL PROTEIN: 6.5 g/dL (ref 6.5–8.1)

## 2015-10-23 NOTE — Progress Notes (Signed)
Central telemetry notified of discharge, telemetry removed.

## 2015-10-23 NOTE — Progress Notes (Signed)
Cardiology staff to room to place an event monitor as ordered by Dr. Harl Bowie.

## 2015-10-23 NOTE — Progress Notes (Signed)
Discharge instructions reviewed with patient and family, questions answered, understanding verbalized.  Event monitor in place.  Discharged via wheelchair accomapanied by staff for discharge home in care of family.  Stable at discharge.

## 2015-10-23 NOTE — Care Management Obs Status (Signed)
[place] NOTIFICATION   Patient Details  Name: [name] MRN: [id number] Date of Birth: 1950-09-26   Medicare Observation Status Notification Given:  Yes    Alvie Heidelberg, RN 10/23/2015, 11:35 AM

## 2015-10-23 NOTE — Care Management Note (Signed)
Case Management Note  Patient Details  Name: [name] MRN: [id number] Date of Birth: January 21, 1951  Subjective/Objective:       Spoke with patient who is alert and oriented from home. Stated that they can afford medications and are able to make thier medical appointments without difficulty. No DME or O2 at home. No CM needs identified.             Action/Plan:Home with self care.   Expected Discharge Date:  10/24/15               Expected Discharge Plan:  Home/Self Care  In-House Referral:     Discharge planning Services  CM Consult  Post Acute Care Choice:    Choice offered to:     DME Arranged:    DME Agency:     HH Arranged:    HH Agency:     Status of Service:  Completed, signed off  Medicare Important Message Given:    Date Medicare IM Given:    Medicare IM give by:    Date Additional Medicare IM Given:    Additional Medicare Important Message give by:     If discussed at [place] of Stay Meetings, dates discussed:    Additional Comments:  Alvie Heidelberg, RN 10/23/2015, 11:34 AM

## 2015-10-23 NOTE — Progress Notes (Signed)
Primary Cardiologist: Carlyle Dolly MD  Cardiology Specific Problem List: 1.Bradycardia  2. Dizziness   Subjective:    No complaints of dizziness or weakness  Objective:   Temp:  [97.8 F (36.6 C)-98.4 F (36.9 C)] 98.4 F (36.9 C) (04/13 0500) Pulse Rate:  [58-76] 74 (04/13 0500) Resp:  [16-18] 16 (04/13 0500) BP: (126-157)/(66-76) 151/72 mmHg (04/13 0500) SpO2:  [94 %-97 %] 94 % (04/13 0500) Weight:  [249 lb (112.946 kg)-260 lb (117.935 kg)] 259 lb 4.8 oz (117.618 kg) (04/12 1910) Last BM Date: 10/22/15  Filed Weights   10/22/15 1457 10/22/15 1910  Weight: 260 lb (117.935 kg) 259 lb 4.8 oz (117.618 kg)    Intake/Output Summary (Last 24 hours) at 10/23/15 0922 Last data filed at 10/23/15 0500  Gross per 24 hour  Intake      0 ml  Output    500 ml  Net   -500 ml    Telemetry: Sinus rhythm rates in the 60's and 70's.;   Exam:  General: No acute distress.  HEENT: Conjunctiva and lids normal, oropharynx clear.  Lungs: Clear to auscultation, nonlabored.  Cardiac: No elevated JVP or bruits. RRR, no gallop or rub.   Abdomen: Normoactive bowel sounds, nontender, nondistended.  Extremities: No pitting edema, distal pulses full.  Neuropsychiatric: Alert and oriented x3, affect appropriate.   Lab Results:  Basic Metabolic Panel:  Recent Labs Lab 10/22/15 1502 10/23/15 0526  NA 139 143  K 4.0 4.1  CL 102 107  CO2 27 27  GLUCOSE 100* 106*  BUN 18 15  CREATININE 0.68 0.65  CALCIUM 9.3 8.7*    Liver Function Tests:  Recent Labs Lab 10/23/15 0526  AST 22  ALT 17  ALKPHOS 84  BILITOT 0.6  PROT 6.5  ALBUMIN 3.6    CBC:  Recent Labs Lab 10/22/15 1502  WBC 6.1  HGB 13.5  HCT 42.1  MCV 95.0  PLT 295    Cardiac Enzymes:  Recent Labs Lab 10/22/15 1502  TROPONINI <0.03     Medications:   Scheduled Medications: . enoxaparin (LOVENOX) injection  40 mg Subcutaneous Q24H  . gemfibrozil  600 mg Oral BID AC  . lamoTRIgine   400 mg Oral QHS  . QUEtiapine  400 mg Oral QHS  . sodium chloride flush  3 mL Intravenous Q12H  . venlafaxine  37.5 mg Oral q morning - 10a    Infusions: . sodium chloride 125 mL/hr at 10/22/15 2017    PRN Medications: acetaminophen **OR** acetaminophen, ALPRAZolam, HYDROcodone-acetaminophen, ondansetron **OR** ondansetron (ZOFRAN) IV   Assessment and Plan:   1. Bradycardia; No evidence of significant bradycardia or pauses on review of telemetry. She has been up walking in the room and has no symptoms. Can consider OP monitor on discharge.   2. Weakness;  No hypotension or evidence of orthostatic hypotension.  Phill Myron. Lawrence NP [place]  10/23/2015, 9:22 AM   Patient seen and discussed with NP Lawerence, I agree with her documentation. Admitted from office yesterday with intermittent episodes of weakness and lightheadedness. She has an ekg done by pcp a few days ago that showed sinus bradycardia, the initial concern was for possible symptomatic bradycardia. However, she had an episode in clinic and pulse was 60 with normal bp. She does have a history of vertigo but reports these symptoms are different, primarily the feeling of lightheadedness. Reports several episodes of palpitations over the last week associated with fatigue and lightheadness, can last several hours.  Cr 0.68, K 4, TSH 2, trop neg, Hgb 13.5, Plt 295 Orthostatics negative 12/2013 echo: LVEF 65-70%, no WMAs, grade I diastolic dysfunction Repeat echo pending   Episodes of lightheadness and weakness of unclear etiolgy. EKG in pcp's office showed sinus tach at 55, however thus far we have not been able to specifically correlate her symptoms with an arrhythmia. F/u echo today, plan would be discharge with 7 day event monitor and f/u with our clinic in 1 week. I have asked her to not drive for the time being due to the associated risks.    Zandra Abts MD

## 2015-10-23 NOTE — Discharge Summary (Signed)
Physician Discharge Summary  [name] [id number] DOB: 02-07-1951 DOA: 10/22/2015  PCP: [name], MD  Admit date: 10/22/2015 Discharge date: 10/23/2015  Time spent: 35 minutes  Recommendations for Outpatient Follow-up:  1. Follow up with cardiology Dr Harl Bowie as arranged. 2. Follow up with your PCP next week.     Discharge Diagnoses:  Principal Problem:   Pre-syncope Active Problems:   Bradycardia   Bipolar disorder (HCC)   Anxiety   Episodic lightheadedness   Discharge Condition: same, but no symptoms of dizziness, lightheadedness, SOB, or CP.   Diet recommendation: Cardiac.  Filed Weights   10/22/15 1457 10/22/15 1910  Weight: 117.935 kg (260 lb) 117.618 kg (259 lb 4.8 oz)    History of present illness: Patient was admitted to the hospital for sinus bradycardia on October 22, 2015 by Dr Jonnie Finner.   As per his H and P:  " [name] is a 65 y.o. female with hx of bipolar d/o, anxiety, prior breast cancer 2013, IBS/ OSA, HTN who presenting with recent onset dizziness and pre-syncope episodes for the last week or so. Was in PCP's office on Monday 2 days ago and had bradycardia with HR 30's. Cardiology appt made for today but today the heart rate was around 60. Cardiologist suggested OBS admit overnight to observe on telemetry. We are asked to admit for this reason.   Patient has bipolar on same meds for many yrs per patient (lamictal/ xanax/ effexor/ seroquel) and also takes Lopid. No recent medication changes. Has had some nausea the past few days associated with dizziness, but no abd pain or vomiting. NO recent illness/ flu/ URI/ diarrhea. No family members sick, no fevers. Eating OK. No BP meds. No OTC meds. NOfocal paralysis/ numbness, no visual loss or HA's. NO room spinning or pt spinning sensations. She did fall once and hit her head then felt vertigo for a day or two after that but that was not recently.   Daughter is here with patient, patient  gives all the history though. Daughter is a Pharmacist, hospital , works in [employer]. Pt lives in [place]. No etoh , tobacco, never smoked. Was a Marine scientist.    Hospital Course:   Patient was admitted into the telemetry and her HR was monitored.  It remained in NSR and had no recorded bradycardia.  She was seen in consultation with cardiology, and Dr Harl Bowie had read her ECHO, which was normal.  It was confirmed that she was not on any new medication with negative chronotropic property, and she has not been on any beta blocker eye drops.  Her TSH was normal.  It was felt that her lightheadedness may have been slight vertigo, and it was noted that she did not have near syncope, ever.   Dr Harl Bowie had placed an event monitor on her, and felt that she can be safely discharged today.   She will follow up with him in the office, and strict precautions were given to her to seek emergency medical care, and not to drive until cleared by him during this period of evaluation.   She will see her PCP as scheduled.  It was a pleasure to take care of your nice patient.  Thank you and Good Day.    Consultations:  Cardiology, Dr Harl Bowie.   Discharge Exam: Filed Vitals:   10/23/15 1000 10/23/15 1556  BP:  144/99  Pulse: 68 64  Temp:  98.1 F (36.7 C)  Resp:  16   Discharge Instructions  Discharge Instructions    Call MD for:  difficulty breathing, headache or visual disturbances    Complete by:  As directed      Call MD for:  extreme fatigue    Complete by:  As directed      Call MD for:  persistant dizziness or light-headedness    Complete by:  As directed      Call MD for:  persistant nausea and vomiting    Complete by:  As directed      Diet - low sodium heart healthy    Complete by:  As directed      Discharge instructions    Complete by:  As directed   Follow up with cardiology as arranged by Dr Harl Bowie.     Increase activity slowly    Complete by:  As directed           Current Discharge Medication List     CONTINUE these medications which have NOT CHANGED   Details  acetaminophen (TYLENOL 8 HOUR) 650 MG CR tablet Take 650 mg by mouth every morning.    ALPRAZolam (XANAX XR) 1 MG 24 hr tablet Take 1 mg by mouth 2 (two) times daily.    gemfibrozil (LOPID) 600 MG tablet TAKE 1 TABLET (600 MG TOTAL) BY MOUTH 2 (TWO) TIMES DAILY. Qty: 180 tablet, Refills: 1    glucosamine-chondroitin 500-400 MG tablet Take 2 tablets by mouth daily.    lamoTRIgine (LAMICTAL) 200 MG tablet Take 400 mg by mouth at bedtime.     QUEtiapine (SEROQUEL) 200 MG tablet Take 400 mg by mouth at bedtime.    venlafaxine (EFFEXOR) 37.5 MG tablet Take 37.5 mg by mouth daily. TAKES ONE-HALF TABLET ONLY IN THE MORNING    Vitamin D, Ergocalciferol, (DRISDOL) 50000 UNITS CAPS capsule Take 50,000 Units by mouth every 7 (seven) days.      STOP taking these medications     ALPRAZolam (XANAX) 1 MG tablet      meclizine (ANTIVERT) 25 MG tablet        Allergies  Allergen Reactions  . Imipramine Hives and Rash  . Tricor [Fenofibrate] Other (See Comments)    Pain, "aching"      The results of significant diagnostics from this hospitalization (including imaging, microbiology, ancillary and laboratory) are listed below for reference.    Significant Diagnostic Studies: No results found.  Microbiology: No results found for this or any previous visit (from the past 240 hour(s)).   Labs: Basic Metabolic Panel:  Recent Labs Lab 10/22/15 1502 10/23/15 0526  NA 139 143  K 4.0 4.1  CL 102 107  CO2 27 27  GLUCOSE 100* 106*  BUN 18 15  CREATININE 0.68 0.65  CALCIUM 9.3 8.7*   Liver Function Tests:  Recent Labs Lab 10/23/15 0526  AST 22  ALT 17  ALKPHOS 84  BILITOT 0.6  PROT 6.5  ALBUMIN 3.6   CBC:  Recent Labs Lab 10/22/15 1502  WBC 6.1  NEUTROABS 3.2  HGB 13.5  HCT 42.1  MCV 95.0  PLT 295   Cardiac Enzymes:  Recent Labs Lab 10/22/15 1502  TROPONINI <0.03    SignedOrvan Falconer MD.   [facility] 10/23/2015, 4:09 PM

## 2015-10-24 ENCOUNTER — Other Ambulatory Visit: Payer: Self-pay | Admitting: *Deleted

## 2015-10-24 DIAGNOSIS — R001 Bradycardia, unspecified: Secondary | ICD-10-CM

## 2015-11-04 ENCOUNTER — Ambulatory Visit (INDEPENDENT_AMBULATORY_CARE_PROVIDER_SITE_OTHER): Payer: PPO | Admitting: Adult Health

## 2015-11-04 ENCOUNTER — Encounter: Payer: Self-pay | Admitting: Adult Health

## 2015-11-04 VITALS — BP 100/60 | HR 62 | Ht 64.0 in | Wt 258.0 lb

## 2015-11-04 DIAGNOSIS — I952 Hypotension due to drugs: Secondary | ICD-10-CM

## 2015-11-04 DIAGNOSIS — R001 Bradycardia, unspecified: Secondary | ICD-10-CM

## 2015-11-04 NOTE — Progress Notes (Signed)
Name: [name]    DOB: 04-02-1951  Age: 65 y.o.  MR#: [id number]       PCP:  [name], MD      Insurance: Payor: Tennis Must / Plan: Tennis Must / Product Type: *No Product type* /   CC:    Chief Complaint  Patient presents with  . Bradycardia    VS Filed Vitals:   11/04/15 1430  BP: 100/60  Pulse: 62  Height: 5\' 4"  (1.626 m)  Weight: 258 lb (117.028 kg)  SpO2: 95%    Weights Current Weight  11/04/15 258 lb (117.028 kg)  10/22/15 259 lb 4.8 oz (117.618 kg)  10/22/15 249 lb (112.946 kg)    Blood Pressure  BP Readings from Last 3 Encounters:  11/04/15 100/60  10/23/15 144/99  10/22/15 126/76     Admit date:  (Not on file) Last encounter with RMR:  Visit date not found   Allergy Imipramine and Tricor  Current Outpatient Prescriptions  Medication Sig Dispense Refill  . acetaminophen (TYLENOL 8 HOUR) 650 MG CR tablet Take 650 mg by mouth every morning.    [name] ALPRAZolam (XANAX XR) 1 MG 24 hr tablet Take 1 mg by mouth 2 (two) times daily.    [name] gemfibrozil (LOPID) 600 MG tablet TAKE 1 TABLET (600 MG TOTAL) BY MOUTH 2 (TWO) TIMES DAILY. (Patient taking differently: TAKE 1 TABLET (600 MG TOTAL) BY MOUTH DAILY IN THE MORNING) 180 tablet 1  . glucosamine-chondroitin 500-400 MG tablet Take 2 tablets by mouth daily.    [name] lamoTRIgine (LAMICTAL) 200 MG tablet Take 400 mg by mouth at bedtime.     [name] QUEtiapine (SEROQUEL) 200 MG tablet Take 400 mg by mouth at bedtime.    [name] venlafaxine (EFFEXOR) 37.5 MG tablet Take 37.5 mg by mouth daily. TAKES ONE-HALF TABLET ONLY IN THE MORNING    . Vitamin D, Ergocalciferol, (DRISDOL) 50000 UNITS CAPS capsule Take 50,000 Units by mouth every 7 (seven) days.     No current facility-administered medications for this visit.    Discontinued Meds:   There are no discontinued medications.  Patient Active Problem List   Diagnosis Date Noted  . Bradycardia 10/22/2015  . Bipolar disorder ([place]) 10/22/2015  . Anxiety  10/22/2015  . Pre-syncope 10/22/2015  . Episodic lightheadedness   . GERD (gastroesophageal reflux disease) 01/08/2014  . Hypertension 11/28/2013  . Palpitations 11/28/2013  . Osteoarthritis 11/08/2013  . Lumbar back pain 11/08/2013  . Hypertriglyceridemia 08/06/2013  . Depression 04/20/2013  . Breast cancer, left breast ([place]) 07/22/2011    LABS    Component Value Date/Time   NA 143 10/23/2015 0526   NA 139 10/22/2015 1502   NA 140 07/05/2015 1200   NA 146* 02/24/2015 0826   K 4.1 10/23/2015 0526   K 4.0 10/22/2015 1502   K 4.4 07/05/2015 1200   CL 107 10/23/2015 0526   CL 102 10/22/2015 1502   CL 105 07/05/2015 1200   CO2 27 10/23/2015 0526   CO2 27 10/22/2015 1502   CO2 29 07/05/2015 1200   GLUCOSE 106* 10/23/2015 0526   GLUCOSE 100* 10/22/2015 1502   GLUCOSE 116* 07/05/2015 1200   GLUCOSE 115* 02/24/2015 0826   BUN 15 10/23/2015 0526   BUN 18 10/22/2015 1502   BUN 28* 07/05/2015 1200   BUN 20 02/24/2015 0826   CREATININE 0.65 10/23/2015 0526   CREATININE 0.68 10/22/2015 1502   CREATININE 0.61 07/05/2015 1200   CREATININE 0.78 08/22/2014 0828   CALCIUM  8.7* 10/23/2015 0526   CALCIUM 9.3 10/22/2015 1502   CALCIUM 9.6 07/05/2015 1200   GFRNONAA >60 10/23/2015 0526   GFRNONAA >60 10/22/2015 1502   GFRNONAA >60 07/05/2015 1200   GFRAA >60 10/23/2015 0526   GFRAA >60 10/22/2015 1502   GFRAA >60 07/05/2015 1200   CMP     Component Value Date/Time   NA 143 10/23/2015 0526   NA 146* 02/24/2015 0826   K 4.1 10/23/2015 0526   CL 107 10/23/2015 0526   CO2 27 10/23/2015 0526   GLUCOSE 106* 10/23/2015 0526   GLUCOSE 115* 02/24/2015 0826   BUN 15 10/23/2015 0526   BUN 20 02/24/2015 0826   CREATININE 0.65 10/23/2015 0526   CREATININE 0.78 08/22/2014 0828   CALCIUM 8.7* 10/23/2015 0526   PROT 6.5 10/23/2015 0526   PROT 6.7 02/24/2015 0826   ALBUMIN 3.6 10/23/2015 0526   ALBUMIN 4.2 02/24/2015 0826   AST 22 10/23/2015 0526   ALT 17 10/23/2015 0526   ALKPHOS 84  10/23/2015 0526   BILITOT 0.6 10/23/2015 0526   BILITOT 0.4 02/24/2015 0826   GFRNONAA >60 10/23/2015 0526   GFRAA >60 10/23/2015 0526       Component Value Date/Time   WBC 6.1 10/22/2015 1502   WBC 9.2 07/05/2015 1200   WBC 6.5 08/22/2014 0828   WBC 6.2 07/22/2011 1440   WBC 5.5 07/21/2010 1451   WBC 5.6 12/02/2009 1353   HGB 13.5 10/22/2015 1502   HGB 13.5 07/05/2015 1200   HGB 13.6 08/22/2014 0828   HGB 13.3 07/22/2011 1440   HGB 13.4 07/21/2010 1451   HGB 13.7 12/02/2009 1353   HCT 42.1 10/22/2015 1502   HCT 41.2 07/05/2015 1200   HCT 40.5 08/22/2014 0828   HCT 40.1 07/22/2011 1440   HCT 39.5 07/21/2010 1451   HCT 41.8 12/02/2009 1353   MCV 95.0 10/22/2015 1502   MCV 95.2 07/05/2015 1200   MCV 91.4 08/22/2014 0828   MCV 92.5 07/22/2011 1440   MCV 91.5 07/21/2010 1451   MCV 93.5 12/02/2009 1353    Lipid Panel     Component Value Date/Time   CHOL 174 02/24/2015 0826   CHOL 142 08/22/2014 0828   TRIG 164* 02/24/2015 0826   HDL 42 02/24/2015 0826   HDL 43 08/22/2014 0828   CHOLHDL 4.1 02/24/2015 0826   CHOLHDL 3.3 08/22/2014 0828   VLDL 27 08/22/2014 0828   LDLCALC 99 02/24/2015 0826   LDLCALC 72 08/22/2014 0828    ABG No results found for: PHART, PCO2ART, PO2ART, HCO3, TCO2, ACIDBASEDEF, O2SAT   Lab Results  Component Value Date   TSH 2.040 10/20/2015   BNP (last 3 results) No results for input(s): BNP in the last 8760 hours.  ProBNP (last 3 results) No results for input(s): PROBNP in the last 8760 hours.  Cardiac Panel (last 3 results) No results for input(s): CKTOTAL, CKMB, TROPONINI, RELINDX in the last 72 hours.  Iron/TIBC/Ferritin/ %Sat No results found for: IRON, TIBC, FERRITIN, IRONPCTSAT   EKG Orders placed or performed during the hospital encounter of 10/22/15  . EKG 12-Lead  . EKG 12-Lead  . EKG 12-Lead  . EKG 12-Lead  . EKG     Prior Assessment and Plan Problem List as of 11/04/2015      Cardiovascular and Mediastinum    Hypertension   Pre-syncope     Digestive   GERD (gastroesophageal reflux disease)     Musculoskeletal and Integument   Osteoarthritis     Other  Hypertriglyceridemia   Bipolar disorder (HCC)   Breast cancer, left breast (HCC)   Depression   Lumbar back pain   Palpitations   Bradycardia   Anxiety   Episodic lightheadedness       Imaging: No results found.

## 2015-11-04 NOTE — Progress Notes (Deleted)
Cardiology Office Note   Date:  11/04/2015   ID:  [name], DOB 1950/08/22, MRN [id number]  PCP:  [name], MD  Cardiologist: Cloria Spring, NP   Chief Complaint  Patient presents with  . Bradycardia      History of Present Illness: [name] is a 65 y.o. female who presents for ongoing assessment and management of bradycardia, with history of bipolar disorder anxiety, hypertriglyceridemia, morbid obesity, and hypertension. The patient had a echocardiogram and nuclear study and echo  which was normal. She was not on any rate reducing medications.A. Heart rate monitor was placed on the patient, if she was found to have significant bradycardia if she would be transported to [facility] for pacemaker.evaluation, the patient's heart rate was found to be 60 beats per minute with lowest in the 30s.she was discharged home and was to follow-up for further recommendations.   Predominant rhythm was normal sinus.  Most symptoms corresponded with normal sinus rhythm.  An isoalted episode of shortness of breath correlated with sinus rhythm with PAC's.  She states she feels tired all the time but denies any chest pain. She states she feels her heart palpating but on auscultation remains normal rhythm. She has to followup with her psychiatrist she states that she will have to have her medications adjusted. She denies any chest pain.  Past Medical History  Diagnosis Date  . Bipolar 1 disorder ([place])   . Anxiety   . Cancer [facility]) breast cancer 7 yrs ago  . High cholesterol   . Breast cancer, left breast ([place]) 07/22/2011  . Depression   . OA (osteoarthritis)   . Obesity   . IBS (irritable bowel syndrome)   . Polycystic ovarian disease   . Sleep apnea   . Impaired fasting glucose   . Hypertension     Past Surgical History  Procedure Laterality Date  . Total knee arthroplasty Bilateral right knee    2012, 2007  . Cholecystectomy    . Abdominal hysterectomy     . Breast lumpectomy Left 2008  . Appendectomy    . Colonoscopy N/A 09/13/2013    Procedure: COLONOSCOPY;  Surgeon: [name], MD;  [facility];  Service: Endoscopy;  Laterality: N/A;  730  . Esophagogastroduodenoscopy N/A 01/17/2014    Procedure: ESOPHAGOGASTRODUODENOSCOPY (EGD);  Surgeon: [name], MD;  Location[facility];  Service: Endoscopy;  Laterality: N/A;  245     Current Outpatient Prescriptions  Medication Sig Dispense Refill  . acetaminophen (TYLENOL 8 HOUR) 650 MG CR tablet Take 650 mg by mouth every morning.    [name] ALPRAZolam (XANAX XR) 1 MG 24 hr tablet Take 1 mg by mouth 2 (two) times daily.    [name] gemfibrozil (LOPID) 600 MG tablet TAKE 1 TABLET (600 MG TOTAL) BY MOUTH 2 (TWO) TIMES DAILY. (Patient taking differently: TAKE 1 TABLET (600 MG TOTAL) BY MOUTH DAILY IN THE MORNING) 180 tablet 1  . glucosamine-chondroitin 500-400 MG tablet Take 2 tablets by mouth daily.    [name] lamoTRIgine (LAMICTAL) 200 MG tablet Take 400 mg by mouth at bedtime.     [name] QUEtiapine (SEROQUEL) 200 MG tablet Take 400 mg by mouth at bedtime.    [name] venlafaxine (EFFEXOR) 37.5 MG tablet Take 37.5 mg by mouth daily. TAKES ONE-HALF TABLET ONLY IN THE MORNING    . Vitamin D, Ergocalciferol, (DRISDOL) 50000 UNITS CAPS capsule Take 50,000 Units by mouth every 7 (seven) days.  No current facility-administered medications for this visit.    Allergies:   Imipramine and Tricor    Social History:  The patient  reports that she has never smoked. She has never used smokeless tobacco. She reports that she does not drink alcohol or use illicit drugs.   Family History:  The patient's family history includes Alcohol abuse in her father; Bipolar disorder in her mother and sister; Diabetes in her mother and sister; Heart disease in her other; Hypertension in her father.    ROS: All other systems are reviewed and negative. Unless otherwise mentioned in H&P    PHYSICAL EXAM: VS:  BP 100/60 mmHg   Pulse 62  Ht 5\' 4"  (1.626 m)  Wt 258 lb (117.028 kg)  BMI 44.26 kg/m2  SpO2 95% , BMI Body mass index is 44.26 kg/(m^2). GEN: Well nourished, well developed, in no acute distress HEENT: normal Neck: no JVD, carotid bruits, or masses Cardiac: RRR; no murmurs, rubs, or gallops,no edema  Respiratory:  clear to auscultation bilaterally, normal work of breathing GI: soft, nontender, nondistended, + BS MS: no deformity or atrophy Skin: warm and dry, no rash Neuro:  Strength and sensation are intact Psych: euthymic mood, full affect   Recent Labs: 10/20/2015: TSH 2.040 10/22/2015: Hemoglobin 13.5; Platelets 295 10/23/2015: ALT 17; BUN 15; Creatinine, Ser 0.65; Potassium 4.1; Sodium 143    Lipid Panel    Component Value Date/Time   CHOL 174 02/24/2015 0826   CHOL 142 08/22/2014 0828   TRIG 164* 02/24/2015 0826   HDL 42 02/24/2015 0826   HDL 43 08/22/2014 0828   CHOLHDL 4.1 02/24/2015 0826   CHOLHDL 3.3 08/22/2014 0828   VLDL 27 08/22/2014 0828   LDLCALC 99 02/24/2015 0826   LDLCALC 72 08/22/2014 0828      Wt Readings from Last 3 Encounters:  11/04/15 258 lb (117.028 kg)  10/22/15 259 lb 4.8 oz (117.618 kg)  10/22/15 249 lb (112.946 kg)      ASSESSMENT AND PLAN:  1.  Bradycardia: heart is normal at this time. Review of cardiac monitor did not reveal bradycardia or significant pauses or arrhythmia. She did have some PACs. She continues to feel weak and tired. This may be related to psychiatric meds. We'll not adjust dose and defer to psychiatry as they are better trained to do so. I have given her reassurance.  2. Hypotension.she is on a good bit of medications for anxiety and depression along with bipolar. These can be contributing to her blood pressure. We'll defer to psychiatry for medication adjustments.  Current medicines are reviewed at length with the patient today.    Labs/ tests ordered today include:  No orders of the defined types were placed in this  encounter.     Disposition:   FU with 6 months.  Signed, Jory Sims, NP  11/04/2015 3:56 PM    [facility] [address], [place], [place] [address] Phone: [phone]; Fax: [phone]

## 2015-11-06 ENCOUNTER — Ambulatory Visit (INDEPENDENT_AMBULATORY_CARE_PROVIDER_SITE_OTHER): Payer: PPO | Admitting: Family Medicine

## 2015-11-06 ENCOUNTER — Encounter: Payer: Self-pay | Admitting: Family Medicine

## 2015-11-06 VITALS — BP 132/78 | Ht 64.0 in | Wt 261.0 lb

## 2015-11-06 DIAGNOSIS — R5383 Other fatigue: Secondary | ICD-10-CM | POA: Diagnosis not present

## 2015-11-06 DIAGNOSIS — R001 Bradycardia, unspecified: Secondary | ICD-10-CM | POA: Diagnosis not present

## 2015-11-06 NOTE — Progress Notes (Signed)
   Subjective:    Patient ID: [name], female    DOB: [birth year], 65 y.o.   MRN: [id number]  HPI Follow up hospitalization.   Still having dizziness.At times that she is bending over and then she stands up she feels a little dizzy no passing out.  Saw cardiologist 2 days ago.  Patient states she finds herself frustrated feeling like she is been labeled as being mental health issues I talked with her at length I told her that I certainly took her bradycardia seriously and that we need to monitor this and if she has recurrence of this then she will need possibility of pacemaker she did do event monitor it did not show any further bradycardia which is good news.  Review of Systems Denies vomiting diarrhea chest pain shortness breath sweats chills or fever    Objective:   Physical Exam  Lungs clear hearts regular pulse normal extremities no edema blood pressure taken laying sitting standing without difficulty  Regular physical activity recommended to improve stamina    Assessment & Plan:  Dizziness-orthostatic but patient is not truly orthostatic therefore no adjustments of any meds  Patient will talk with her psychiatrist whether or not medications need to be adjusted  Bradycardia resolved for now but patient educated how to watch for this follow-up again in 4-6 months

## 2015-11-10 DIAGNOSIS — F410 Panic disorder [episodic paroxysmal anxiety] without agoraphobia: Secondary | ICD-10-CM | POA: Diagnosis not present

## 2015-11-10 DIAGNOSIS — F3132 Bipolar disorder, current episode depressed, moderate: Secondary | ICD-10-CM | POA: Diagnosis not present

## 2015-11-12 NOTE — Patient Instructions (Signed)
Your physician wants you to follow-up in: 6 Months with Dr. Branch. You will receive a reminder letter in the mail two months in advance. If you don't receive a letter, please call our office to schedule the follow-up appointment.  Your physician recommends that you continue on your current medications as directed. Please refer to the Current Medication list given to you today.  If you need a refill on your cardiac medications before your next appointment, please call your pharmacy.  Thank you for choosing [facility]!   

## 2015-11-13 DIAGNOSIS — H524 Presbyopia: Secondary | ICD-10-CM | POA: Diagnosis not present

## 2015-11-27 ENCOUNTER — Ambulatory Visit (INDEPENDENT_AMBULATORY_CARE_PROVIDER_SITE_OTHER): Payer: PPO | Admitting: Family Medicine

## 2015-11-27 ENCOUNTER — Encounter: Payer: Self-pay | Admitting: Family Medicine

## 2015-11-27 VITALS — BP 124/80 | HR 62 | Temp 97.8°F | Ht 64.0 in | Wt 261.0 lb

## 2015-11-27 DIAGNOSIS — I498 Other specified cardiac arrhythmias: Secondary | ICD-10-CM

## 2015-11-27 DIAGNOSIS — R001 Bradycardia, unspecified: Secondary | ICD-10-CM | POA: Diagnosis not present

## 2015-11-27 DIAGNOSIS — R079 Chest pain, unspecified: Secondary | ICD-10-CM | POA: Diagnosis not present

## 2015-11-27 NOTE — Progress Notes (Signed)
   Subjective:    Patient ID: [name], female    DOB: [birth year], 65 y.o.   MRN: [id number]  Chest Pain  This is a new problem. The current episode started today. The problem occurs intermittently. The problem has been unchanged. The pain is moderate. The pain does not radiate. Associated symptoms include back pain and palpitations. Associated symptoms comments: dizzy. The pain is aggravated by nothing. She has tried rest for the symptoms. The treatment provided mild relief. There are no known risk factors.   on further history Pressure sensation in the back , not really in the chest. Also seems to be worse with motions. No associated nausea or diaphoresis or shortness of breath.  Patient also feeling slightly dizzy. She relates this to her new psychotropic medication. Next  No lightheadedness no feeling like she is given a pass out. No substernal pressure.  At times has Felt heart beating fast, other times seems to be eating too slow  On further history patient notes she was just evaluated for bradycardia. She was admitted to the hospital. Had further testing done including monitoring. Was advised she may need to consider further psychotropic medication. Patient did have an echocardiogram which revealed excellent ejection fraction, and had a stress test last year on review of prior notes  Review prior EKGs revealed sinus bradycardia when she presented to the office. And subsequently when she went on to the hospital  Day [id number]   Review of Systems  Cardiovascular: Positive for chest pain and palpitations.  Musculoskeletal: Positive for back pain.       Objective:   Physical Exam  Alert talkative no acute distress. Anxious appearing at times. Overly happy appearing at other times. HEENT normal lungs clear heart rhythm regular with occasional irregularity noted Blood pressure 126/78 on repea EKG appears to be junctional rhythm with an occasional extra beat        Assessment & Plan:  Impression symptomatic bradycardia. Patient appears hemodynamically stable this evening. No distress. Blood pressure excellent. No lightheadedness. Patient is adamant about not wanting to go right back over to the hospital. At the same time this is a new rhythm which requires further assessment. The cardiologist no revealed the patient once had junctional rhythm in the past. May well be a candidate for intervention such as pacemaker etc. Patient's mental health does create a challenge. The first part of the visit with her she was adamant about not going elsewhere for further intervention, but she does express understanding that we may need to take this further. I will speak with cardiologist tomorrow and will decide on further plans. I have advised the patient we will call back tomorrow with further recommendations. Addendum: I also walked over to the hospital and spoke with the on-site cardiologist regarding the patient's EKG. He agreed with her hemodynamic stability we do not have to send her right back to the emergency room/hospital, but he did advise we press on and have her see one of their electrophysiology specialist. 45 minutes spent most in discussion WSL WSL

## 2015-11-28 ENCOUNTER — Encounter: Payer: Self-pay | Admitting: Family Medicine

## 2015-12-01 DIAGNOSIS — F3111 Bipolar disorder, current episode manic without psychotic features, mild: Secondary | ICD-10-CM | POA: Diagnosis not present

## 2015-12-01 DIAGNOSIS — F410 Panic disorder [episodic paroxysmal anxiety] without agoraphobia: Secondary | ICD-10-CM | POA: Diagnosis not present

## 2015-12-01 DIAGNOSIS — F3131 Bipolar disorder, current episode depressed, mild: Secondary | ICD-10-CM | POA: Diagnosis not present

## 2015-12-03 ENCOUNTER — Encounter: Payer: Self-pay | Admitting: Internal Medicine

## 2015-12-03 ENCOUNTER — Ambulatory Visit (INDEPENDENT_AMBULATORY_CARE_PROVIDER_SITE_OTHER): Payer: PPO

## 2015-12-03 ENCOUNTER — Ambulatory Visit (INDEPENDENT_AMBULATORY_CARE_PROVIDER_SITE_OTHER): Payer: PPO | Admitting: Internal Medicine

## 2015-12-03 VITALS — BP 102/70 | HR 45 | Ht 64.0 in | Wt 262.6 lb

## 2015-12-03 DIAGNOSIS — I495 Sick sinus syndrome: Secondary | ICD-10-CM

## 2015-12-03 LAB — CBC WITH DIFFERENTIAL/PLATELET
BASOS PCT: 0 %
Basophils Absolute: 0 cells/uL (ref 0–200)
EOS ABS: 83 {cells}/uL (ref 15–500)
Eosinophils Relative: 1 %
HEMATOCRIT: 41.7 % (ref 35.0–45.0)
HEMOGLOBIN: 13.9 g/dL (ref 11.7–15.5)
LYMPHS PCT: 32 %
Lymphs Abs: 2656 cells/uL (ref 850–3900)
MCH: 30.7 pg (ref 27.0–33.0)
MCHC: 33.3 g/dL (ref 32.0–36.0)
MCV: 92.1 fL (ref 80.0–100.0)
MPV: 9.8 fL (ref 7.5–12.5)
Monocytes Absolute: 747 cells/uL (ref 200–950)
Monocytes Relative: 9 %
NEUTROS ABS: 4814 {cells}/uL (ref 1500–7800)
NEUTROS PCT: 58 %
Platelets: 316 10*3/uL (ref 140–400)
RBC: 4.53 MIL/uL (ref 3.80–5.10)
RDW: 13.7 % (ref 11.0–15.0)
WBC: 8.3 10*3/uL (ref 3.8–10.8)

## 2015-12-03 LAB — TROPONIN I: Troponin I: <0.03 ng/mL (ref <0.031)

## 2015-12-03 LAB — HEPATIC FUNCTION PANEL
ALBUMIN: 4.4 g/dL (ref 3.6–5.1)
ALK PHOS: 92 U/L (ref 33–130)
ALT: 14 U/L (ref 6–29)
AST: 17 U/L (ref 10–35)
BILIRUBIN DIRECT: 0.1 mg/dL (ref <=0.2)
BILIRUBIN INDIRECT: 0.4 mg/dL (ref 0.2–1.2)
BILIRUBIN TOTAL: 0.5 mg/dL (ref 0.2–1.2)
Total Protein: 7.1 g/dL (ref 6.1–8.1)

## 2015-12-03 LAB — AMYLASE: Amylase: 44 U/L (ref 0–105)

## 2015-12-03 LAB — BASIC METABOLIC PANEL
BUN: 21 mg/dL (ref 7–25)
CHLORIDE: 102 mmol/L (ref 98–110)
CO2: 25 mmol/L (ref 20–31)
Calcium: 9.5 mg/dL (ref 8.6–10.4)
Creat: 0.91 mg/dL (ref 0.50–0.99)
GLUCOSE: 116 mg/dL — AB (ref 65–99)
Potassium: 4.7 mmol/L (ref 3.5–5.3)
Sodium: 139 mmol/L (ref 135–146)

## 2015-12-03 NOTE — Progress Notes (Signed)
HPI [name] is referred by Dr. Wolfgang Phoenix for evaluation of sinus node dysfunction and junctional rhythm. She is a pleasant 65 yo woman with obesity and polycystic ovaries who has a h/o arthritis and is s/p bilateral knee replacement. She has nausea and feels poorly. She is tearful and her affect is blunted. She appears frustrated that no one can figure out what is wrong. She denies chest pain or sob. No vomiting. Her nausea is not related to oral intake. She denies any fever. No diarrhea or weight loss. Allergies  Allergen Reactions  . Imipramine Hives and Rash  . Tricor [Fenofibrate] Other (See Comments)    Pain, "aching"     Current Outpatient Prescriptions  Medication Sig Dispense Refill  . acetaminophen (TYLENOL 8 HOUR) 650 MG CR tablet Take 650 mg by mouth every morning.    [name] ALPRAZolam (XANAX XR) 1 MG 24 hr tablet Take 1 mg by mouth 2 (two) times daily.    [name] ALPRAZolam (XANAX) 1 MG tablet Take 1 mg by mouth as directed. Takes one half qd prn. Takes with xanax XR 1mg  one bid    . gemfibrozil (LOPID) 600 MG tablet Take 600 mg by mouth.    [name] glucosamine-chondroitin 500-400 MG tablet Take 2 tablets by mouth daily.    [name] lamoTRIgine (LAMICTAL) 200 MG tablet Take 400 mg by mouth at bedtime.     [name] QUEtiapine (SEROQUEL) 200 MG tablet Take 200 mg by mouth 2 (two) times daily.     [name] venlafaxine (EFFEXOR) 37.5 MG tablet Take 37.5 mg by mouth daily. TAKES ONE-HALF TABLET ONLY IN THE MORNING    . Vitamin D, Ergocalciferol, (DRISDOL) 50000 UNITS CAPS capsule Take 50,000 Units by mouth every 7 (seven) days.     No current facility-administered medications for this visit.     Past Medical History  Diagnosis Date  . Bipolar 1 disorder ([place])   . Anxiety   . Cancer [facility]) breast cancer 7 yrs ago  . High cholesterol   . Breast cancer, left breast ([place]) 07/22/2011  . Depression   . OA (osteoarthritis)   . Obesity   . IBS (irritable bowel syndrome)   . Polycystic ovarian disease   .  Sleep apnea   . Impaired fasting glucose   . Hypertension     ROS:   All systems reviewed and negative except as noted in the HPI.   Past Surgical History  Procedure Laterality Date  . Total knee arthroplasty Bilateral right knee    2012, 2007  . Cholecystectomy    . Abdominal hysterectomy    . Breast lumpectomy Left 2008  . Appendectomy    . Colonoscopy N/A 09/13/2013    Procedure: COLONOSCOPY;  Surgeon: [name], MD;  [facility];  Service: Endoscopy;  Laterality: N/A;  730  . Esophagogastroduodenoscopy N/A 01/17/2014    Procedure: ESOPHAGOGASTRODUODENOSCOPY (EGD);  Surgeon: [name], MD;  [facility];  Service: Endoscopy;  Laterality: N/A;  245     Family History  Problem Relation Age of Onset  . Bipolar disorder Mother   . Diabetes Mother   . Bipolar disorder Sister   . Diabetes Sister   . Alcohol abuse Father   . Hypertension Father   . Heart disease Other      Social History   Social History  . Marital Status: Married    Spouse Name: N/A  . Number of Children: N/A  . Years of  Education: N/A   Occupational History  . Not on file.   Social History Main Topics  . Smoking status: Never Smoker   . Smokeless tobacco: Never Used  . Alcohol Use: No  . Drug Use: No  . Sexual Activity: No   Other Topics Concern  . Not on file   Social History Narrative   04/24/2013 [name] was born and grew up in [place]. She reports that her childhood was "tough." She has 2 older sisters and a younger brother. She achieved an Associates Degree in nursing. 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 She has 2 daughters and one son. She denies any legal difficulties. She affiliates as Engineer, manufacturing. Her hobbies include reading, sewing, crafts, and cooking. She reports that her social support system consists of her friend and passed her. 04/24/2013 [name]     BP 102/70 mmHg  Pulse 45  Ht 5'  4" (1.626 m)  Wt 262 lb 9.6 oz (119.115 kg)  BMI 45.05 kg/m2  SpO2 91%  Physical Exam:  anxious appearing obese woman, NAD HEENT: Unremarkable Neck:  7 cm JVD, no thyromegally Lymphatics:  No adenopathy Back:  No CVA tenderness Lungs:  Clear with no wheezes HEART:  IRegular rate rhythm, no murmurs, no rubs, no clicks Abd:  soft, obese, positive bowel sounds, no organomegally, no rebound, no guarding Ext:  2 plus pulses, no edema, no cyanosis, no clubbing Skin:  No rashes no nodules Neuro:  CN II through XII intact, motor grossly intact  EKG - sinus bradycardia with junctional escape and PAC's.  Assess/Plan: 1. Sinus node dysfunction - she has a junctional escape. It is unclear as to whether her sinus node dysfunction is behind her symptoms. She cannot walk because of her knee replacements. I have asked her to wear a cardiac monitor. If she has significant daytime bradycardia then we would place a PPM.  2. Nausea - no evidence of occult CAD. I will obtain labs today including a troponin. I do not think this is a coronary event and the patient is not interest in going to the hospital unless absolutely mandatory. 3. Junctional rhythm - it is unclear if this is behind her symptoms. Will obtain a 48 hour holter.  Mikle Bosworth.D.

## 2015-12-03 NOTE — Patient Instructions (Signed)
Medication Instructions:  Your physician recommends that you continue on your current medications as directed. Please refer to the Current Medication list given to you today.   Labwork: Your physician recommends that you return for lab work in: TODAY (Amylase, Trop, BMET, Liver, CBC)   Testing/Procedures: Your physician has recommended that you wear a 48 holter monitor. Holter monitors are medical devices that record the heart's electrical activity. Doctors most often use these monitors to diagnose arrhythmias. Arrhythmias are problems with the speed or rhythm of the heartbeat. The monitor is a small, portable device. You can wear one while you do your normal daily activities. This is usually used to diagnose what is causing palpitations/syncope (passing out).    Follow-Up: Pending lab work and Holter results.   Any Other Special Instructions Will Be Listed Below (If Applicable).     If you need a refill on your cardiac medications before your next appointment, please call your pharmacy.

## 2015-12-11 DIAGNOSIS — I495 Sick sinus syndrome: Secondary | ICD-10-CM

## 2015-12-11 HISTORY — DX: Sick sinus syndrome: I49.5

## 2015-12-18 ENCOUNTER — Telehealth: Payer: Self-pay | Admitting: Internal Medicine

## 2015-12-18 NOTE — Telephone Encounter (Signed)
1. NSR and sinus bradycardia down to 35/min with junctional and ventricular escape  2. No sustained atrial or ventricular arrhythmias 3. Marked sinus node dysfunction. 4. Noise artifact is present 5. PVC's are present, at times in a bigeminal distribution. 6. Diary not submitted.  Mikle Bosworth.D.

## 2015-12-18 NOTE — Telephone Encounter (Signed)
New Message  Pt calling requesting to speak with nurse about results of her monitor. Pt also wanted to know if she need to make an appt to get the monitor checked. Please call back to discuss

## 2015-12-18 NOTE — Telephone Encounter (Signed)
Spoke with patient and let her know I would discuss plan with Dr Lovena Le on Tues when he is back in the office and call her back

## 2015-12-23 NOTE — Telephone Encounter (Signed)
Discussed with Dr Lovena Le and will have the patient come back in for OV to determine POC

## 2015-12-30 ENCOUNTER — Encounter: Payer: Self-pay | Admitting: Internal Medicine

## 2015-12-30 ENCOUNTER — Ambulatory Visit (INDEPENDENT_AMBULATORY_CARE_PROVIDER_SITE_OTHER): Payer: PPO | Admitting: Internal Medicine

## 2015-12-30 VITALS — BP 120/78 | HR 72 | Ht 64.0 in | Wt 265.0 lb

## 2015-12-30 DIAGNOSIS — I495 Sick sinus syndrome: Secondary | ICD-10-CM | POA: Diagnosis not present

## 2015-12-30 LAB — CBC WITH DIFFERENTIAL/PLATELET
BASOS ABS: 0 {cells}/uL (ref 0–200)
Basophils Relative: 0 %
Eosinophils Absolute: 204 cells/uL (ref 15–500)
Eosinophils Relative: 3 %
HEMATOCRIT: 42.8 % (ref 35.0–45.0)
HEMOGLOBIN: 14.4 g/dL (ref 11.7–15.5)
LYMPHS ABS: 2108 {cells}/uL (ref 850–3900)
Lymphocytes Relative: 31 %
MCH: 31.2 pg (ref 27.0–33.0)
MCHC: 33.6 g/dL (ref 32.0–36.0)
MCV: 92.8 fL (ref 80.0–100.0)
MONO ABS: 612 {cells}/uL (ref 200–950)
MPV: 9.5 fL (ref 7.5–12.5)
Monocytes Relative: 9 %
NEUTROS PCT: 57 %
Neutro Abs: 3876 cells/uL (ref 1500–7800)
Platelets: 293 10*3/uL (ref 140–400)
RBC: 4.61 MIL/uL (ref 3.80–5.10)
RDW: 13.7 % (ref 11.0–15.0)
WBC: 6.8 10*3/uL (ref 3.8–10.8)

## 2015-12-30 LAB — BASIC METABOLIC PANEL
BUN: 16 mg/dL (ref 7–25)
CO2: 26 mmol/L (ref 20–31)
Calcium: 9.8 mg/dL (ref 8.6–10.4)
Chloride: 104 mmol/L (ref 98–110)
Creat: 0.8 mg/dL (ref 0.50–0.99)
GLUCOSE: 120 mg/dL — AB (ref 65–99)
POTASSIUM: 5.3 mmol/L (ref 3.5–5.3)
SODIUM: 142 mmol/L (ref 135–146)

## 2015-12-30 NOTE — Progress Notes (Signed)
HPI [name] returns today for ongoing evaluation of sinus node dysfunction and junctional rhythm. She is a pleasant 65 yo woman with obesity and polycystic ovaries who has a h/o arthritis and is s/p bilateral knee replacement. She has multiple complaints. She has undergone a cardiac monitor which demonstrates daytime bradycardia with HR's in the mid 30's. She will have junctional rhythm and feels poorly with this. She is on no sinus nor AV nodal blocking agents. She denies chest pain or sob. No vomiting. Her nausea is not related to oral intake. She denies any fever. No diarrhea or weight loss. Allergies  Allergen Reactions  . Imipramine Hives and Rash  . Tricor [Fenofibrate] Other (See Comments)    Pain, "aching"     Current Outpatient Prescriptions  Medication Sig Dispense Refill  . acetaminophen (TYLENOL 8 HOUR) 650 MG CR tablet Take 650 mg by mouth every morning.    [name] ALPRAZolam (XANAX XR) 1 MG 24 hr tablet Take 1 mg by mouth 2 (two) times daily.    [name] ALPRAZolam (XANAX) 1 MG tablet Take 1 mg by mouth as directed. Takes one half qd prn. Takes with xanax XR 1mg  one bid    . gemfibrozil (LOPID) 600 MG tablet Take 600 mg by mouth as directed.     [name] glucosamine-chondroitin 500-400 MG tablet Take 2 tablets by mouth daily.    [name] lamoTRIgine (LAMICTAL) 200 MG tablet Take 400 mg by mouth at bedtime.     [name] QUEtiapine (SEROQUEL) 200 MG tablet Take 400 mg by mouth at bedtime.     [name] venlafaxine (EFFEXOR) 37.5 MG tablet TAKE ONE-HALF TABLET BY MOUTH ONLY IN THE MORNING    . Vitamin D, Ergocalciferol, (DRISDOL) 50000 UNITS CAPS capsule Take 50,000 Units by mouth every 7 (seven) days.     No current facility-administered medications for this visit.     Past Medical History  Diagnosis Date  . Bipolar 1 disorder ([place])   . Anxiety   . Cancer [facility]) breast cancer 7 yrs ago  . High cholesterol   . Breast cancer, left breast ([place]) 07/22/2011  . Depression   . OA (osteoarthritis)   .  Obesity   . IBS (irritable bowel syndrome)   . Polycystic ovarian disease   . Sleep apnea   . Impaired fasting glucose   . Hypertension     ROS:   All systems reviewed and negative except as noted in the HPI.   Past Surgical History  Procedure Laterality Date  . Total knee arthroplasty Bilateral right knee    2012, 2007  . Cholecystectomy    . Abdominal hysterectomy    . Breast lumpectomy Left 2008  . Appendectomy    . Colonoscopy N/A 09/13/2013    Procedure: COLONOSCOPY;  Surgeon: [name], MD;  [facility];  Service: Endoscopy;  Laterality: N/A;  730  . Esophagogastroduodenoscopy N/A 01/17/2014    Procedure: ESOPHAGOGASTRODUODENOSCOPY (EGD);  Surgeon: [name], MD;  Location[facility];  Service: Endoscopy;  Laterality: N/A;  245     Family History  Problem Relation Age of Onset  . Bipolar disorder Mother   . Diabetes Mother   . Bipolar disorder Sister   . Diabetes Sister   . Alcohol abuse Father   . Hypertension Father   . Heart disease Other      Social History   Social History  . Marital Status: Married    Spouse Name: N/A  .  Number of Children: N/A  . Years of Education: N/A   Occupational History  . Not on file.   Social History Main Topics  . Smoking status: Never Smoker   . Smokeless tobacco: Never Used  . Alcohol Use: No  . Drug Use: No  . Sexual Activity: No   Other Topics Concern  . Not on file   Social History Narrative   04/24/2013 [name] was born and grew up in [place]. She reports that her childhood was "tough." She has 2 older sisters and a younger brother. She achieved an Associates Degree in nursing. 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 She has 2 daughters and one son. She denies any legal difficulties. She affiliates as Engineer, manufacturing. Her hobbies include reading, sewing, crafts, and cooking. She reports that her social support system consists of  her friend and passed her. 04/24/2013 [name]     BP 120/78 mmHg  Pulse 72  Ht 5\' 4"  (1.626 m)  Wt 265 lb (120.203 kg)  BMI 45.46 kg/m2  Physical Exam:  anxious appearing obese woman, NAD HEENT: Unremarkable Neck:  7 cm JVD, no thyromegally Lymphatics:  No adenopathy Back:  No CVA tenderness Lungs:  Clear with no wheezes HEART:  IRegular rate rhythm, no murmurs, no rubs, no clicks Abd:  soft, obese, positive bowel sounds, no organomegally, no rebound, no guarding Ext:  2 plus pulses, no edema, no cyanosis, no clubbing Skin:  No rashes no nodules Neuro:  CN II through XII intact, motor grossly intact  EKG - sinus bradycardia with junctional escape and PAC's.  Assess/Plan: 1. Sinus node dysfunction - she has a junctional escape. She has documented daytime HR's in the mid 30's with junctional escape rhythm. I have discussed the treatment options with the patient and also been careful to discuss the limitations of the device. Because she may have a component of autonomic dysfunction, will plan to use a Biotronik MRI DDD PM with CLS. 2. Nausea - no evidence of occult CAD. I will obtain labs today including a troponin. I do not think this is a coronary event and the patient is not interest in going to the hospital unless absolutely mandatory. 3. Obesity - will encourage weight loss.   Mikle Bosworth.D.

## 2015-12-30 NOTE — Patient Instructions (Signed)
Medication Instructions:  Your physician recommends that you continue on your current medications as directed. Please refer to the Current Medication list given to you today.   Labwork: Your physician recommends that you return for lab work today: BMP/CBC   Testing/Procedures: Your physician has recommended that you have a pacemaker inserted. A pacemaker is a small device that is placed under the skin of your chest or abdomen to help control abnormal heart rhythms. This device uses electrical pulses to prompt the heart to beat at a normal rate. Pacemakers are used to treat heart rhythms that are too slow. Wire (leads) are attached to the pacemaker that goes into the chambers of you heart. This is done in the hospital and usually requires and overnight stay. Please see the instruction sheet given to you today for more information.  Please arrive at the [facility] on 01/06/16 at 7:30am Do not eat or drink after midnight the night prior to your procedure Do not take any medications the morning of your procedure    Follow-Up: Your physician recommends that you schedule a follow-up appointment in: 10-14 days from 01/06/16 in the device clinic for a wound check and 3 months from 01/06/16 with Dr Lovena Le   Any Other Special Instructions Will Be Listed Below (If Applicable).     If you need a refill on your cardiac medications before your next appointment, please call your pharmacy.

## 2016-01-02 ENCOUNTER — Encounter: Payer: Self-pay | Admitting: Internal Medicine

## 2016-01-04 ENCOUNTER — Other Ambulatory Visit: Payer: Self-pay

## 2016-01-04 ENCOUNTER — Emergency Department (HOSPITAL_COMMUNITY)
Admission: EM | Admit: 2016-01-04 | Discharge: 2016-01-04 | Disposition: A | Discharge status: Home / Self Care | Payer: PPO | Attending: Emergency Medicine | Admitting: Emergency Medicine

## 2016-01-04 ENCOUNTER — Encounter (HOSPITAL_COMMUNITY): Payer: Self-pay | Admitting: Emergency Medicine

## 2016-01-04 ENCOUNTER — Emergency Department (HOSPITAL_COMMUNITY): Payer: PPO

## 2016-01-04 DIAGNOSIS — R079 Chest pain, unspecified: Secondary | ICD-10-CM | POA: Diagnosis not present

## 2016-01-04 DIAGNOSIS — Z86000 Personal history of in-situ neoplasm of breast: Secondary | ICD-10-CM | POA: Insufficient documentation

## 2016-01-04 DIAGNOSIS — R002 Palpitations: Secondary | ICD-10-CM | POA: Diagnosis not present

## 2016-01-04 DIAGNOSIS — Z79899 Other long term (current) drug therapy: Secondary | ICD-10-CM | POA: Insufficient documentation

## 2016-01-04 DIAGNOSIS — R42 Dizziness and giddiness: Secondary | ICD-10-CM | POA: Diagnosis not present

## 2016-01-04 DIAGNOSIS — I10 Essential (primary) hypertension: Secondary | ICD-10-CM | POA: Insufficient documentation

## 2016-01-04 DIAGNOSIS — M1990 Unspecified osteoarthritis, unspecified site: Secondary | ICD-10-CM | POA: Insufficient documentation

## 2016-01-04 DIAGNOSIS — F329 Major depressive disorder, single episode, unspecified: Secondary | ICD-10-CM | POA: Diagnosis not present

## 2016-01-04 LAB — CBC
HCT: 42.3 % (ref 36.0–46.0)
Hemoglobin: 14 g/dL (ref 12.0–15.0)
MCH: 31.5 pg (ref 26.0–34.0)
MCHC: 33.1 g/dL (ref 30.0–36.0)
MCV: 95.3 fL (ref 78.0–100.0)
Platelets: 261 10*3/uL (ref 150–400)
RBC: 4.44 MIL/uL (ref 3.87–5.11)
RDW: 13.4 % (ref 11.5–15.5)
WBC: 18.3 10*3/uL — AB (ref 4.0–10.5)

## 2016-01-04 LAB — BASIC METABOLIC PANEL
Anion gap: 8 (ref 5–15)
BUN: 23 mg/dL — AB (ref 6–20)
CHLORIDE: 103 mmol/L (ref 101–111)
CO2: 25 mmol/L (ref 22–32)
Calcium: 9.1 mg/dL (ref 8.9–10.3)
Creatinine, Ser: 0.72 mg/dL (ref 0.44–1.00)
Glucose, Bld: 133 mg/dL — ABNORMAL HIGH (ref 65–99)
POTASSIUM: 4.4 mmol/L (ref 3.5–5.1)
SODIUM: 136 mmol/L (ref 135–145)

## 2016-01-04 LAB — TROPONIN I: Troponin I: <0.03 ng/mL (ref <0.031)

## 2016-01-04 NOTE — ED Notes (Signed)
Pt is supposed to get pacemaker on Tuesday and c/o irregular hr today, neck pressure, chest pressure.  Pt sob, lightheaded.

## 2016-01-04 NOTE — ED Provider Notes (Signed)
CSN: [id number]     Arrival date & time 01/04/16  1042 History  By signing my name below, I, Higinio Plan, attest that this documentation has been prepared under the direction and in the presence of Milton Ferguson, MD . Electronically Signed: Higinio Plan, Scribe. 01/04/2016. 12:22 PM  Chief Complaint  Patient presents with  . Chest Pain   Patient is a 65 y.o. female presenting with chest pain. The history is provided by the patient (Patient complains some palpitations and chest discomfort). No language interpreter was used.  Chest Pain Pain location:  L chest Pain quality: aching and pressure   Pain radiates to:  Does not radiate Pain radiates to the back: no   Pain severity:  Mild Onset quality:  Gradual Duration:  1 day Timing:  Intermittent Progression:  Improving Chronicity:  Chronic Relieved by:  Nothing Worsened by:  Nothing tried Ineffective treatments:  None tried Associated symptoms: no abdominal pain, no back pain, no cough, no fatigue and no headache    HPI Comments: [name] is a 65 y.o. female with PMHx of HTN and HLD, who presents to the Emergency Department complaining of intermittent, irregular heart rate that she noticed yesterday morning. Pt states associated symptoms of chest pressure that began this morning, lightheadedness, and headache that began a few days ago. She reports she was diagnosed with sick sinus syndrome and is sceduled to get a pacemaker on Tuesday by Dr. Cristopher Peru at [facility]. She denies shortness of breath, nausea, vomiting, cough, and fever.   Past Medical History  Diagnosis Date  . Bipolar 1 disorder ([place])   . Anxiety   . Cancer [facility]) breast cancer 7 yrs ago  . High cholesterol   . Breast cancer, left breast ([place]) 07/22/2011  . Depression   . OA (osteoarthritis)   . Obesity   . IBS (irritable bowel syndrome)   . Polycystic ovarian disease   . Sleep apnea   . Impaired fasting glucose   . Hypertension    Past Surgical History   Procedure Laterality Date  . Total knee arthroplasty Bilateral right knee    2012, 2007  . Cholecystectomy    . Abdominal hysterectomy    . Breast lumpectomy Left 2008  . Appendectomy    . Colonoscopy N/A 09/13/2013    Procedure: COLONOSCOPY;  Surgeon: [name], MD;  [facility];  Service: Endoscopy;  Laterality: N/A;  730  . Esophagogastroduodenoscopy N/A 01/17/2014    Procedure: ESOPHAGOGASTRODUODENOSCOPY (EGD);  Surgeon: [name], MD;  [facility];  Service: Endoscopy;  Laterality: N/A;  245   Family History  Problem Relation Age of Onset  . Bipolar disorder Mother   . Diabetes Mother   . Bipolar disorder Sister   . Diabetes Sister   . Alcohol abuse Father   . Hypertension Father   . Heart disease Other    Social History  Substance Use Topics  . Smoking status: Never Smoker   . Smokeless tobacco: Never Used  . Alcohol Use: No   OB History    No data available     Review of Systems  Constitutional: Negative for chills, appetite change and fatigue.  HENT: Negative for congestion, ear discharge and sinus pressure.   Eyes: Negative for discharge.  Respiratory: Negative for cough.   Cardiovascular: Positive for chest pain.  Gastrointestinal: Negative for abdominal pain and diarrhea.  Genitourinary: Negative for frequency and hematuria.  Musculoskeletal: Negative for back pain.  Skin:  Negative for rash.  Neurological: Positive for light-headedness. Negative for seizures and headaches.  Psychiatric/Behavioral: Negative for hallucinations.   Allergies  Imipramine and Tricor  Home Medications   Prior to Admission medications   Medication Sig Start Date End Date Taking? Authorizing Provider  acetaminophen (TYLENOL 8 HOUR) 650 MG CR tablet Take 650 mg by mouth every morning.    Historical Provider, MD  ALPRAZolam (XANAX XR) 1 MG 24 hr tablet Take 1 mg by mouth 2 (two) times daily.    Historical Provider, MD  ALPRAZolam [name]) 1 MG  tablet Take 0.5 mg by mouth as directed. Takes with xanax XR 1mg  one bid    Historical Provider, MD  gemfibrozil (LOPID) 600 MG tablet Take 600 mg by mouth 2 (two) times daily.     Historical Provider, MD  glucosamine-chondroitin 500-400 MG tablet Take 2 tablets by mouth daily.    Historical Provider, MD  lamoTRIgine (LAMICTAL) 200 MG tablet Take 400 mg by mouth at bedtime.     Historical Provider, MD  Multiple Vitamins-Minerals (OCUVITE PO) Take 1 tablet by mouth daily.    Historical Provider, MD  QUEtiapine (SEROQUEL) 200 MG tablet Take 400 mg by mouth at bedtime.  05/07/13   [name], MD  venlafaxine (EFFEXOR) 37.5 MG tablet TAKE ONE-HALF TABLET BY MOUTH ONLY IN THE MORNING    Historical Provider, MD  Vitamin D, Ergocalciferol, (DRISDOL) 50000 UNITS CAPS capsule Take 50,000 Units by mouth every 7 (seven) days.    Historical Provider, MD   There were no vitals taken for this visit. Physical Exam  Constitutional: She is oriented to person, place, and time. She appears well-developed.  HENT:  Head: Normocephalic.  Eyes: Conjunctivae and EOM are normal. No scleral icterus.  Neck: Neck supple. No thyromegaly present.  Cardiovascular: Normal rate and regular rhythm.  Exam reveals no gallop and no friction rub.   No murmur heard. Pulmonary/Chest: No stridor. She has no wheezes. She has no rales. She exhibits no tenderness.  Abdominal: She exhibits no distension. There is no tenderness. There is no rebound.  Musculoskeletal: Normal range of motion. She exhibits no edema.  Lymphadenopathy:    She has no cervical adenopathy.  Neurological: She is oriented to person, place, and time. She exhibits normal muscle tone. Coordination normal.  Skin: No rash noted. No erythema.  Psychiatric: She has a normal mood and affect. Her behavior is normal.    ED Course  Procedures  DIAGNOSTIC STUDIES:  Oxygen Saturation is 98% on RA, normal by my interpretation.    COORDINATION OF  CARE:  12:26 PM Discussed treatment plan which includes CXR and CT with pt at bedside and pt agreed to plan.  Labs Review Labs Reviewed - No data to display  Imaging Review No results found. I have personally reviewed and evaluated these images and lab results as part of my medical decision-making.   EKG Interpretation None      MDM   Final diagnoses:  None    Patient history of sick sinus syndrome and is to get a pacemaker in 2 days. Labs unremarkable chest x-ray unremarkable. patient's first EKG showed a junctional rhythm.  Second EKG shows normal sinus rhythm. Patient had 2 troponins that were normal.   Patient was discharged home and will have her pacemaker put in in 2 days,The chart was scribed for me under my direct supervision.  I personally performed the history, physical, and medical decision making and all procedures in the evaluation of this  patient.Milton Ferguson, MD 01/04/16 1534

## 2016-01-04 NOTE — Discharge Instructions (Signed)
Follow up Tuesday as planned

## 2016-01-06 ENCOUNTER — Encounter (HOSPITAL_COMMUNITY)
Admission: RE | Disposition: A | Discharge status: Home / Self Care | Payer: Self-pay | Source: Ambulatory Visit | Attending: Internal Medicine

## 2016-01-06 ENCOUNTER — Ambulatory Visit (HOSPITAL_COMMUNITY)
Admission: RE | Admit: 2016-01-06 | Discharge: 2016-01-07 | Disposition: A | Discharge status: Home / Self Care | Payer: PPO | Source: Ambulatory Visit | Attending: Internal Medicine | Admitting: Internal Medicine

## 2016-01-06 ENCOUNTER — Encounter (HOSPITAL_COMMUNITY): Payer: Self-pay | Admitting: Internal Medicine

## 2016-01-06 DIAGNOSIS — Z6841 Body Mass Index (BMI) 40.0 and over, adult: Secondary | ICD-10-CM | POA: Diagnosis not present

## 2016-01-06 DIAGNOSIS — M1990 Unspecified osteoarthritis, unspecified site: Secondary | ICD-10-CM | POA: Insufficient documentation

## 2016-01-06 DIAGNOSIS — E7800 Pure hypercholesterolemia, unspecified: Secondary | ICD-10-CM | POA: Insufficient documentation

## 2016-01-06 DIAGNOSIS — F419 Anxiety disorder, unspecified: Secondary | ICD-10-CM | POA: Insufficient documentation

## 2016-01-06 DIAGNOSIS — I495 Sick sinus syndrome: Secondary | ICD-10-CM | POA: Diagnosis present

## 2016-01-06 DIAGNOSIS — K589 Irritable bowel syndrome without diarrhea: Secondary | ICD-10-CM | POA: Insufficient documentation

## 2016-01-06 DIAGNOSIS — Z96653 Presence of artificial knee joint, bilateral: Secondary | ICD-10-CM | POA: Insufficient documentation

## 2016-01-06 DIAGNOSIS — G4730 Sleep apnea, unspecified: Secondary | ICD-10-CM | POA: Diagnosis not present

## 2016-01-06 DIAGNOSIS — E669 Obesity, unspecified: Secondary | ICD-10-CM | POA: Insufficient documentation

## 2016-01-06 DIAGNOSIS — I10 Essential (primary) hypertension: Secondary | ICD-10-CM | POA: Diagnosis not present

## 2016-01-06 DIAGNOSIS — F319 Bipolar disorder, unspecified: Secondary | ICD-10-CM | POA: Insufficient documentation

## 2016-01-06 DIAGNOSIS — E162 Hypoglycemia, unspecified: Secondary | ICD-10-CM | POA: Insufficient documentation

## 2016-01-06 DIAGNOSIS — Z950 Presence of cardiac pacemaker: Secondary | ICD-10-CM

## 2016-01-06 DIAGNOSIS — Z853 Personal history of malignant neoplasm of breast: Secondary | ICD-10-CM | POA: Insufficient documentation

## 2016-01-06 DIAGNOSIS — E282 Polycystic ovarian syndrome: Secondary | ICD-10-CM | POA: Diagnosis not present

## 2016-01-06 HISTORY — DX: Presence of cardiac pacemaker: Z95.0

## 2016-01-06 HISTORY — PX: EP IMPLANTABLE DEVICE: SHX172B

## 2016-01-06 HISTORY — DX: Bradycardia, unspecified: R00.1

## 2016-01-06 LAB — SURGICAL PCR SCREEN
MRSA, PCR: NEGATIVE
Staphylococcus aureus: NEGATIVE

## 2016-01-06 SURGERY — PACEMAKER IMPLANT

## 2016-01-06 MED ORDER — IOPAMIDOL (ISOVUE-370) INJECTION 76%
INTRAVENOUS | Status: AC
  Filled 2016-01-06: qty 50

## 2016-01-06 MED ORDER — IOPAMIDOL (ISOVUE-370) INJECTION 76%
INTRAVENOUS | Status: DC | PRN
  Administered 2016-01-06: 10 mL via INTRAVENOUS

## 2016-01-06 MED ORDER — LIDOCAINE HCL (PF) 1 % IJ SOLN
INTRAMUSCULAR | Status: DC | PRN
  Administered 2016-01-06: 57 mL via SUBCUTANEOUS

## 2016-01-06 MED ORDER — CEFAZOLIN SODIUM 10 G IJ SOLR
3.0000 g | INTRAMUSCULAR | Status: AC
  Administered 2016-01-06: 3 g via INTRAVENOUS
  Filled 2016-01-06: qty 3000

## 2016-01-06 MED ORDER — MUPIROCIN 2 % EX OINT
TOPICAL_OINTMENT | CUTANEOUS | Status: AC
  Administered 2016-01-06: 1
  Filled 2016-01-06: qty 22

## 2016-01-06 MED ORDER — GLUCOSAMINE-CHONDROITIN 500-400 MG PO TABS: 2.0000 | ORAL_TABLET | Freq: Every day | ORAL | Status: DC

## 2016-01-06 MED ORDER — CEFAZOLIN IN D5W 1 GM/50ML IV SOLN
1.0000 g | Freq: Four times a day (QID) | INTRAVENOUS | Status: AC
  Administered 2016-01-06 – 2016-01-07 (×3): 1 g via INTRAVENOUS
  Filled 2016-01-06 (×3): qty 50

## 2016-01-06 MED ORDER — ACETAMINOPHEN 325 MG PO TABS
325.0000 mg | ORAL_TABLET | ORAL | Status: DC | PRN
  Administered 2016-01-06: 325 mg via ORAL
  Administered 2016-01-06 – 2016-01-07 (×2): 650 mg via ORAL
  Filled 2016-01-06 (×3): qty 2

## 2016-01-06 MED ORDER — HEPARIN (PORCINE) IN NACL 2-0.9 UNIT/ML-% IJ SOLN
INTRAMUSCULAR | Status: DC | PRN
  Administered 2016-01-06: 500 mL

## 2016-01-06 MED ORDER — ALPRAZOLAM ER 1 MG PO TB24: 1.0000 mg | ORAL_TABLET | Freq: Two times a day (BID) | ORAL | Status: DC

## 2016-01-06 MED ORDER — ACETAMINOPHEN 325 MG PO TABS: 650.0000 mg | ORAL_TABLET | Freq: Every day | ORAL | Status: DC

## 2016-01-06 MED ORDER — MIDAZOLAM HCL 5 MG/5ML IJ SOLN
INTRAMUSCULAR | Status: AC
  Filled 2016-01-06: qty 5

## 2016-01-06 MED ORDER — VENLAFAXINE HCL 37.5 MG PO TABS
37.5000 mg | ORAL_TABLET | Freq: Every day | ORAL | Status: DC
  Administered 2016-01-06 – 2016-01-07 (×2): 37.5 mg via ORAL
  Filled 2016-01-06 (×2): qty 1

## 2016-01-06 MED ORDER — CHLORHEXIDINE GLUCONATE 4 % EX LIQD: 60.0000 mL | Freq: Once | CUTANEOUS | Status: DC

## 2016-01-06 MED ORDER — ALPRAZOLAM 0.5 MG PO TABS
1.0000 mg | ORAL_TABLET | Freq: Two times a day (BID) | ORAL | Status: DC
  Administered 2016-01-07: 1 mg via ORAL
  Filled 2016-01-06: qty 2

## 2016-01-06 MED ORDER — YOU HAVE A PACEMAKER BOOK
Freq: Once | Status: AC
  Administered 2016-01-06: 20:00:00
  Filled 2016-01-06: qty 1

## 2016-01-06 MED ORDER — SODIUM CHLORIDE 0.9 % IR SOLN
Status: AC
  Filled 2016-01-06: qty 2

## 2016-01-06 MED ORDER — FENTANYL CITRATE (PF) 100 MCG/2ML IJ SOLN
INTRAMUSCULAR | Status: DC | PRN
  Administered 2016-01-06: 12.5 ug via INTRAVENOUS

## 2016-01-06 MED ORDER — ALPRAZOLAM 0.5 MG PO TABS
0.5000 mg | ORAL_TABLET | Freq: Two times a day (BID) | ORAL | Status: DC
  Administered 2016-01-06 – 2016-01-07 (×2): 0.5 mg via ORAL
  Filled 2016-01-06 (×2): qty 1

## 2016-01-06 MED ORDER — VITAMIN D (ERGOCALCIFEROL) 1.25 MG (50000 UNIT) PO CAPS: 50000.0000 [IU] | ORAL_CAPSULE | ORAL | Status: DC

## 2016-01-06 MED ORDER — SODIUM CHLORIDE 0.9 % IV SOLN
INTRAVENOUS | Status: DC
  Administered 2016-01-06: 09:00:00 via INTRAVENOUS

## 2016-01-06 MED ORDER — ACETAMINOPHEN ER 650 MG PO TBCR: 650.0000 mg | EXTENDED_RELEASE_TABLET | Freq: Every morning | ORAL | Status: DC

## 2016-01-06 MED ORDER — FENTANYL CITRATE (PF) 100 MCG/2ML IJ SOLN
INTRAMUSCULAR | Status: AC
  Filled 2016-01-06: qty 2

## 2016-01-06 MED ORDER — QUETIAPINE FUMARATE 400 MG PO TABS
400.0000 mg | ORAL_TABLET | Freq: Every day | ORAL | Status: DC
  Administered 2016-01-06: 22:00:00 400 mg via ORAL
  Filled 2016-01-06: qty 1

## 2016-01-06 MED ORDER — ONDANSETRON HCL 4 MG/2ML IJ SOLN: 4.0000 mg | Freq: Four times a day (QID) | INTRAMUSCULAR | Status: DC | PRN

## 2016-01-06 MED ORDER — SODIUM CHLORIDE 0.9 % IR SOLN
80.0000 mg | Status: AC
  Administered 2016-01-06: 80 mg

## 2016-01-06 MED ORDER — LAMOTRIGINE 200 MG PO TABS
400.0000 mg | ORAL_TABLET | Freq: Every day | ORAL | Status: DC
  Administered 2016-01-06: 22:00:00 400 mg via ORAL
  Filled 2016-01-06: qty 2

## 2016-01-06 MED ORDER — MIDAZOLAM HCL 5 MG/5ML IJ SOLN
INTRAMUSCULAR | Status: DC | PRN
  Administered 2016-01-06: 1 mg via INTRAVENOUS

## 2016-01-06 MED ORDER — LIDOCAINE HCL (PF) 1 % IJ SOLN
INTRAMUSCULAR | Status: AC
  Filled 2016-01-06: qty 60

## 2016-01-06 SURGICAL SUPPLY — 7 items
CABLE SURGICAL S-101-97-12 (CABLE) ×2 IMPLANT
LEAD SOLIA S PRO MRI 45 (Lead) ×2 IMPLANT
LEAD SOLIA S PRO MRI 53 (Lead) ×2 IMPLANT
PAD DEFIB LIFELINK (PAD) ×2 IMPLANT
PPM ELUNA DR-T 394969 (Pacemaker) ×2 IMPLANT
SHEATH CLASSIC 7F (SHEATH) ×4 IMPLANT
TRAY PACEMAKER INSERTION (PACKS) ×2 IMPLANT

## 2016-01-06 NOTE — Progress Notes (Signed)
Orthopedic Tech Progress Note Patient Details:  [name] Dec 09, 1950 [id number]  Ortho Devices Type of Ortho Device: Arm sling   [place] Pink 01/06/2016, 12:17 PM

## 2016-01-06 NOTE — Discharge Summary (Signed)
ELECTROPHYSIOLOGY PROCEDURE DISCHARGE SUMMARY    Patient ID: [name],  MRN: [id number], DOB/AGE: 01-27-1951 65 y.o.  Admit date: 01/06/2016 Discharge date: 01/07/16   Primary Care Physician: [name], MD Primary Cardiologist: Dr. Harl Bowie Electrophysiologist: Dr. Lovena Le  Primary Discharge Diagnosis:  1. Symptomatic bradycardia /sinus node dysfunction     S/p PPM this admission  Secondary Discharge Diagnosis:  1. PCOD 2. Arthritis 3. HTN  Allergies  Allergen Reactions  . Imipramine Hives and Rash  . Tricor [Fenofibrate] Other (See Comments)    Pain, "aching"     Procedures This Admission:  1.  Implantation of a Biotronik dual chamber PPM on 01/06/16 by Dr Lovena Le.  The patient received a Biotronik (serial number [id number]) pacemaker, Biotronik (serial number [id number]) right atrial lead and a Biotronik (serial number [id number]) right ventricular lead.  There were no immediate post procedure complications. 2.  CXR on  demonstrated no pneumothorax status post device implantation.   Brief HPI: [name] is a 65 y.o. female was referred to electrophysiology in the outpatient setting for consideration of PPM implantation.  Past medical history includes PCOD, HTN, arthritis and symptomatic bradycardia.  The patient has had symptomatic bradycardia without reversible causes identified.  Risks, benefits, and alternatives to PPM implantation were reviewed with the patient who wished to proceed.   Hospital Course:  The patient was admitted and underwent implantation of a PPM with details as outlined above.  She  was monitored on telemetry overnight which demonstrated SR, occ A pacing.  Left chest was without hematoma or ecchymosis.  The device was interrogated and found to be functioning normally.  CXR was obtained and demonstrated no pneumothorax status post device implantation.  Wound care, arm mobility, and restrictions were reviewed with the patient.  CXR describes a  developing LUL infiltrate, she denies any cough/SOB or symptoms of illness, her pre-op labs noted elevated WBC so will rx a Z-pak, and the patient instructed to f/u with her PMD out patient to ensure clearing.The patient was examined by Dr. Lovena Le and considered stable for discharge to home.    Physical Exam: Filed Vitals:   01/06/16 1939 01/06/16 2000 01/07/16 0418 01/07/16 0839  BP: 155/65  143/67 140/62  Pulse: 76  69 71  Temp: 98.5 F (36.9 C)  97.8 F (36.6 C) 97.7 F (36.5 C)  TempSrc: Oral  Oral Oral  Resp: 18 16 19 16   Height:      Weight:   264 lb 15.9 oz (120.2 kg)   SpO2: 93%  95% 95%    GEN- The patient is well appearing, alert and oriented x 3 today.   HEENT: normocephalic, atraumatic; sclera clear, conjunctiva pink; hearing intact; oropharynx clear; neck supple, no JVP Lungs- Clear to ausculation bilaterally, normal work of breathing.  No wheezes, rales, rhonchi Heart- Regular rate and rhythm, no murmurs, rubs or gallops, PMI not laterally displaced GI- soft, non-tender, non-distended Extremities- no clubbing, cyanosis, or edema MS- no significant deformity or atrophy Skin- warm and dry, no rash or lesion, left chest without hematoma/ecchymosis Psych- euthymic mood, full affect Neuro- no gross deficits   Labs:   Lab Results  Component Value Date   WBC 18.3* 01/04/2016   HGB 14.0 01/04/2016   HCT 42.3 01/04/2016   MCV 95.3 01/04/2016   PLT 261 01/04/2016     Recent Labs Lab 01/04/16 1057  NA 136  K 4.4  CL 103  CO2 25  BUN 23*  CREATININE 0.72  CALCIUM 9.1  GLUCOSE 133*    Discharge Medications:    Medication List    TAKE these medications        ALPRAZolam 1 MG 24 hr tablet  Commonly known as:  XANAX XR  Take 1 mg by mouth 2 (two) times daily.     ALPRAZolam 1 MG tablet  Commonly known as:  XANAX  Take 0.5 mg by mouth as directed. Takes with xanax XR 1mg  one bid     azithromycin 250 MG tablet  Commonly known as:  ZITHROMAX Z-PAK    Take 1 tablet (250 mg total) by mouth daily. Take 2 (two) tablets by mouth once today, then one tablet by mouth once daily for 4 days     gemfibrozil 600 MG tablet  Commonly known as:  LOPID  Take 600 mg by mouth 2 (two) times daily.     glucosamine-chondroitin 500-400 MG tablet  Take 2 tablets by mouth daily.     lamoTRIgine 200 MG tablet  Commonly known as:  LAMICTAL  Take 400 mg by mouth at bedtime.     OCUVITE PO  Take 1 tablet by mouth daily.     QUEtiapine 200 MG tablet  Commonly known as:  SEROQUEL  Take 400 mg by mouth at bedtime.     TYLENOL 8 HOUR 650 MG CR tablet  Generic drug:  acetaminophen  Take 650 mg by mouth every morning.     venlafaxine 37.5 MG tablet  Commonly known as:  EFFEXOR  TAKE ONE-HALF TABLET BY MOUTH ONLY IN THE MORNING     Vitamin D (Ergocalciferol) 50000 units Caps capsule  Commonly known as:  DRISDOL  Take 50,000 Units by mouth every 7 (seven) days.        Disposition:  Home Discharge Instructions    Diet - low sodium heart healthy    Complete by:  As directed      Increase activity slowly    Complete by:  As directed           Follow-up Information    Follow up with [facility] On 01/19/2016.   Specialty:  Cardiology   Why:  11:30AM, wound check   Contact information:   [address] [place] [phone]      Follow up with Cristopher Peru, MD On 04/07/2016.   Specialty:  Cardiology   Why:  11:30AM   Contact information:   [address] [address] [phone]       Duration of Discharge Encounter: Greater than 30 minutes including physician time.  Venetia Night, PA-C 01/07/2016 1:05 PM   EP Attending  Patient seen and examined. Agree with above. Her PPM is working normally. Her CXR demonstrates a possible pneumonia although she has no fever or productive sputum. We will dc home on a Z-pack for 5 days. Usual followup.    Mikle Bosworth.D.

## 2016-01-06 NOTE — Interval H&P Note (Signed)
History and Physical Interval Note:  01/06/2016 9:42 AM  [name]  has presented today for surgery, with the diagnosis of snd  The various methods of treatment have been discussed with the patient and family. After consideration of risks, benefits and other options for treatment, the patient has consented to  Procedure(s): Pacemaker Implant (N/A) as a surgical intervention .  The patient's history has been reviewed, patient examined, no change in status, stable for surgery.  I have reviewed the patient's chart and labs.  Questions were answered to the patient's satisfaction.     [name]

## 2016-01-06 NOTE — H&P (View-Only) (Signed)
HPI [name] returns today for ongoing evaluation of sinus node dysfunction and junctional rhythm. She is a pleasant 65 yo woman with obesity and polycystic ovaries who has a h/o arthritis and is s/p bilateral knee replacement. She has multiple complaints. She has undergone a cardiac monitor which demonstrates daytime bradycardia with HR's in the mid 30's. She will have junctional rhythm and feels poorly with this. She is on no sinus nor AV nodal blocking agents. She denies chest pain or sob. No vomiting. Her nausea is not related to oral intake. She denies any fever. No diarrhea or weight loss. Allergies  Allergen Reactions  . Imipramine Hives and Rash  . Tricor [Fenofibrate] Other (See Comments)    Pain, "aching"     Current Outpatient Prescriptions  Medication Sig Dispense Refill  . acetaminophen (TYLENOL 8 HOUR) 650 MG CR tablet Take 650 mg by mouth every morning.    [name] ALPRAZolam (XANAX XR) 1 MG 24 hr tablet Take 1 mg by mouth 2 (two) times daily.    [name] ALPRAZolam (XANAX) 1 MG tablet Take 1 mg by mouth as directed. Takes one half qd prn. Takes with xanax XR 1mg  one bid    . gemfibrozil (LOPID) 600 MG tablet Take 600 mg by mouth as directed.     [name] glucosamine-chondroitin 500-400 MG tablet Take 2 tablets by mouth daily.    [name] lamoTRIgine (LAMICTAL) 200 MG tablet Take 400 mg by mouth at bedtime.     [name] QUEtiapine (SEROQUEL) 200 MG tablet Take 400 mg by mouth at bedtime.     [name] venlafaxine (EFFEXOR) 37.5 MG tablet TAKE ONE-HALF TABLET BY MOUTH ONLY IN THE MORNING    . Vitamin D, Ergocalciferol, (DRISDOL) 50000 UNITS CAPS capsule Take 50,000 Units by mouth every 7 (seven) days.     No current facility-administered medications for this visit.     Past Medical History  Diagnosis Date  . Bipolar 1 disorder ([place])   . Anxiety   . Cancer [facility]) breast cancer 7 yrs ago  . High cholesterol   . Breast cancer, left breast ([place]) 07/22/2011  . Depression   . OA (osteoarthritis)   .  Obesity   . IBS (irritable bowel syndrome)   . Polycystic ovarian disease   . Sleep apnea   . Impaired fasting glucose   . Hypertension     ROS:   All systems reviewed and negative except as noted in the HPI.   Past Surgical History  Procedure Laterality Date  . Total knee arthroplasty Bilateral right knee    2012, 2007  . Cholecystectomy    . Abdominal hysterectomy    . Breast lumpectomy Left 2008  . Appendectomy    . Colonoscopy N/A 09/13/2013    Procedure: COLONOSCOPY;  Surgeon: [name], MD;  [facility];  Service: Endoscopy;  Laterality: N/A;  730  . Esophagogastroduodenoscopy N/A 01/17/2014    Procedure: ESOPHAGOGASTRODUODENOSCOPY (EGD);  Surgeon: [name], MD;  [facility];  Service: Endoscopy;  Laterality: N/A;  245     Family History  Problem Relation Age of Onset  . Bipolar disorder Mother   . Diabetes Mother   . Bipolar disorder Sister   . Diabetes Sister   . Alcohol abuse Father   . Hypertension Father   . Heart disease Other      Social History   Social History  . Marital Status: Married    Spouse Name: N/A  .  Number of Children: N/A  . Years of Education: N/A   Occupational History  . Not on file.   Social History Main Topics  . Smoking status: Never Smoker   . Smokeless tobacco: Never Used  . Alcohol Use: No  . Drug Use: No  . Sexual Activity: No   Other Topics Concern  . Not on file   Social History Narrative   04/24/2013 [name] was born and grew up in [place]. She reports that her childhood was "tough." She has 2 older sisters and a younger brother. She achieved an Associates Degree in nursing. 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 She has 2 daughters and one son. She denies any legal difficulties. She affiliates as Engineer, manufacturing. Her hobbies include reading, sewing, crafts, and cooking. She reports that her social support system consists of  her friend and passed her. 04/24/2013 [name]     BP 120/78 mmHg  Pulse 72  Ht 5\' 4"  (1.626 m)  Wt 265 lb (120.203 kg)  BMI 45.46 kg/m2  Physical Exam:  anxious appearing obese woman, NAD HEENT: Unremarkable Neck:  7 cm JVD, no thyromegally Lymphatics:  No adenopathy Back:  No CVA tenderness Lungs:  Clear with no wheezes HEART:  IRegular rate rhythm, no murmurs, no rubs, no clicks Abd:  soft, obese, positive bowel sounds, no organomegally, no rebound, no guarding Ext:  2 plus pulses, no edema, no cyanosis, no clubbing Skin:  No rashes no nodules Neuro:  CN II through XII intact, motor grossly intact  EKG - sinus bradycardia with junctional escape and PAC's.  Assess/Plan: 1. Sinus node dysfunction - she has a junctional escape. She has documented daytime HR's in the mid 30's with junctional escape rhythm. I have discussed the treatment options with the patient and also been careful to discuss the limitations of the device. Because she may have a component of autonomic dysfunction, will plan to use a Biotronik MRI DDD PM with CLS. 2. Nausea - no evidence of occult CAD. I will obtain labs today including a troponin. I do not think this is a coronary event and the patient is not interest in going to the hospital unless absolutely mandatory. 3. Obesity - will encourage weight loss.   Mikle Bosworth.D.

## 2016-01-06 NOTE — Interval H&P Note (Signed)
History and Physical Interval Note:  01/06/2016 8:49 AM  [name]  has presented today for surgery, with the diagnosis of snd  The various methods of treatment have been discussed with the patient and family. After consideration of risks, benefits and other options for treatment, the patient has consented to  Procedure(s): Pacemaker Implant (N/A) as a surgical intervention .  The patient's history has been reviewed, patient examined, no change in status, stable for surgery.  I have reviewed the patient's chart and labs.  Questions were answered to the patient's satisfaction.     Cristopher Peru

## 2016-01-07 ENCOUNTER — Ambulatory Visit (HOSPITAL_COMMUNITY): Payer: PPO

## 2016-01-07 DIAGNOSIS — E7800 Pure hypercholesterolemia, unspecified: Secondary | ICD-10-CM | POA: Diagnosis not present

## 2016-01-07 DIAGNOSIS — E282 Polycystic ovarian syndrome: Secondary | ICD-10-CM | POA: Diagnosis not present

## 2016-01-07 DIAGNOSIS — E669 Obesity, unspecified: Secondary | ICD-10-CM | POA: Diagnosis not present

## 2016-01-07 DIAGNOSIS — Z96653 Presence of artificial knee joint, bilateral: Secondary | ICD-10-CM | POA: Diagnosis not present

## 2016-01-07 DIAGNOSIS — F419 Anxiety disorder, unspecified: Secondary | ICD-10-CM | POA: Diagnosis not present

## 2016-01-07 DIAGNOSIS — Z853 Personal history of malignant neoplasm of breast: Secondary | ICD-10-CM | POA: Diagnosis not present

## 2016-01-07 DIAGNOSIS — I495 Sick sinus syndrome: Secondary | ICD-10-CM | POA: Diagnosis not present

## 2016-01-07 DIAGNOSIS — R918 Other nonspecific abnormal finding of lung field: Secondary | ICD-10-CM | POA: Diagnosis not present

## 2016-01-07 DIAGNOSIS — G4730 Sleep apnea, unspecified: Secondary | ICD-10-CM | POA: Diagnosis not present

## 2016-01-07 DIAGNOSIS — M1990 Unspecified osteoarthritis, unspecified site: Secondary | ICD-10-CM | POA: Diagnosis not present

## 2016-01-07 DIAGNOSIS — I10 Essential (primary) hypertension: Secondary | ICD-10-CM | POA: Diagnosis not present

## 2016-01-07 DIAGNOSIS — K589 Irritable bowel syndrome without diarrhea: Secondary | ICD-10-CM | POA: Diagnosis not present

## 2016-01-07 DIAGNOSIS — F319 Bipolar disorder, unspecified: Secondary | ICD-10-CM | POA: Diagnosis not present

## 2016-01-07 MED ORDER — AZITHROMYCIN 250 MG PO TABS: 250.0000 mg | ORAL_TABLET | Freq: Every day | ORAL | Status: DC

## 2016-01-07 NOTE — Care Management Note (Signed)
Case Management Note  Patient Details  Name: [name] MRN: [id number] Date of Birth: 07-16-1950  Subjective/Objective:                 OIB for pacemaker. Will DC to home, no CM needs.   Action/Plan:   Expected Discharge Date:                  Expected Discharge Plan:  Home/Self Care  In-House Referral:     Discharge planning Services  CM Consult  Post Acute Care Choice:  NA Choice offered to:     DME Arranged:    DME Agency:     HH Arranged:    HH Agency:     Status of Service:  Completed, signed off  If discussed at [employer] of Stay Meetings, dates discussed:    Additional Comments:  Carles Collet, RN 01/07/2016, 2:12 PM

## 2016-01-07 NOTE — Discharge Instructions (Signed)
° ° °  You need to call and make an appointment to follow up with your primary doctor to follow up on CXR findings to ensure clearing and resolution of possible early pneumonia.   Supplemental Discharge Instructions for  Pacemaker/Defibrillator Patients  Activity No heavy lifting or vigorous activity with your left/right arm for 6 to 8 weeks.  Do not raise your left/right arm above your head for one week.  Gradually raise your affected arm as drawn below.             01/10/16                       01/11/16                      01/12/16                    01/13/16 __  NO DRIVING for  1week   ; you may begin driving on  [id number]    .  WOUND CARE - Keep the wound area clean and dry.  Do not get this area wet for one week. No showers for one week; you may shower on  01/13/16   . - The tape/steri-strips on your wound will fall off; do not pull them off.  No bandage is needed on the site.  DO  NOT apply any creams, oils, or ointments to the wound area. - If you notice any drainage or discharge from the wound, any swelling or bruising at the site, or you develop a fever > 101? F after you are discharged home, call the office at once.  Special Instructions - You are still able to use cellular telephones; use the ear opposite the side where you have your pacemaker/defibrillator.  Avoid carrying your cellular phone near your device. - When traveling through airports, show security personnel your identification card to avoid being screened in the metal detectors.  Ask the security personnel to use the hand wand. - Avoid arc welding equipment, MRI testing (magnetic resonance imaging), TENS units (transcutaneous nerve stimulators).  Call the office for questions about other devices. - Avoid electrical appliances that are in poor condition or are not properly grounded. - Microwave ovens are safe to be near or to operate.  Additional information for defibrillator patients should your device go off: - If your device  goes off ONCE and you feel fine afterward, notify the device clinic nurses. - If your device goes off ONCE and you do not feel well afterward, call 911. - If your device goes off TWICE, call 911. - If your device goes off THREE times in one day, call 911.  DO NOT DRIVE YOURSELF OR A FAMILY MEMBER WITH A DEFIBRILLATOR TO THE HOSPITAL--CALL 911.

## 2016-01-14 ENCOUNTER — Encounter: Payer: Self-pay | Admitting: Family Medicine

## 2016-01-14 ENCOUNTER — Ambulatory Visit (INDEPENDENT_AMBULATORY_CARE_PROVIDER_SITE_OTHER): Payer: PPO | Admitting: Family Medicine

## 2016-01-14 VITALS — BP 130/92 | Temp 98.0°F | Ht 64.0 in | Wt 260.5 lb

## 2016-01-14 DIAGNOSIS — J189 Pneumonia, unspecified organism: Secondary | ICD-10-CM | POA: Diagnosis not present

## 2016-01-14 NOTE — Progress Notes (Signed)
   Subjective:    Patient ID: [name], female    DOB: [birth year], 64 y.o.   MRN: [id number]  Pneumonia This is a new problem. The current episode started in the past 7 days.   Patient in today for a follow up on pneumonia. Patient was seen at [facility] for a cardiac cath and was diagnosed with Pneumonia. Prescribed Z-Pak.  States no other concerns this visit. No chest pain no shortness of breath no nausea vomiting. X-ray and previous testing with cardiology reviewed  Review of Systems    denies hemoptysis denies shortness of breath no fever or chills no nausea or vomiting Objective:   Physical Exam Lungs clear heart regular extremities no edema       Assessment & Plan:  Pneumonia follow-up-no sign and pneumonia today X-rays a few weeks as a follow-up patient does not smoke

## 2016-01-14 NOTE — Patient Instructions (Signed)
Please do chest x-ray at the end of July or start of this please call us if any problems

## 2016-01-19 ENCOUNTER — Ambulatory Visit: Payer: PPO

## 2016-01-20 ENCOUNTER — Encounter: Payer: Self-pay | Admitting: Internal Medicine

## 2016-01-20 ENCOUNTER — Ambulatory Visit (INDEPENDENT_AMBULATORY_CARE_PROVIDER_SITE_OTHER): Payer: PPO | Admitting: *Deleted

## 2016-01-20 DIAGNOSIS — R001 Bradycardia, unspecified: Secondary | ICD-10-CM | POA: Diagnosis not present

## 2016-01-20 DIAGNOSIS — I495 Sick sinus syndrome: Secondary | ICD-10-CM

## 2016-01-20 LAB — CUP PACEART INCLINIC DEVICE CHECK
Battery Remaining Longevity: 107 mo
Implantable Lead Implant Date: 20170627
Implantable Lead Location: 753859
Implantable Lead Model: 377
Implantable Lead Serial Number: 49436227
Lead Channel Impedance Value: 624 Ohm
Lead Channel Pacing Threshold Amplitude: 0.8 V
Lead Channel Pacing Threshold Amplitude: 0.8 V
Lead Channel Pacing Threshold Amplitude: 1 V
Lead Channel Pacing Threshold Amplitude: 1 V
Lead Channel Pacing Threshold Pulse Width: 0.4 ms
Lead Channel Pacing Threshold Pulse Width: 0.4 ms
Lead Channel Pacing Threshold Pulse Width: 0.4 ms
Lead Channel Pacing Threshold Pulse Width: 0.4 ms
Lead Channel Sensing Intrinsic Amplitude: 12.4 mV
Lead Channel Setting Pacing Amplitude: 3 V
Lead Channel Setting Pacing Amplitude: 3 V
MDC IDC LEAD IMPLANT DT: 20170627
MDC IDC LEAD LOCATION: 753860
MDC IDC LEAD SERIAL: 49471106
MDC IDC MSMT BATTERY REMAINING PERCENTAGE: 100 %
MDC IDC MSMT LEADCHNL RA IMPEDANCE VALUE: 468 Ohm
MDC IDC MSMT LEADCHNL RA SENSING INTR AMPL: 3.5 mV
MDC IDC MSMT LEADCHNL RA SENSING INTR AMPL: 3.8 mV
MDC IDC MSMT LEADCHNL RV PACING THRESHOLD AMPLITUDE: 1 V
MDC IDC MSMT LEADCHNL RV PACING THRESHOLD PULSEWIDTH: 0.4 ms
MDC IDC MSMT LEADCHNL RV SENSING INTR AMPL: 12.1 mV
MDC IDC PG SERIAL: 68786455
MDC IDC SESS DTM: 20170711135043
MDC IDC SET LEADCHNL RV PACING PULSEWIDTH: 0.4 ms
MDC IDC STAT BRADY RA PERCENT PACED: 44 %
MDC IDC STAT BRADY RV PERCENT PACED: 0 %

## 2016-01-20 NOTE — Progress Notes (Signed)
Wound check appointment. Steri-strips removed. Wound without redness or edema. Incision edges approximated, wound well healed. Stitch removed from mid incision. Area dressed with antibiotic ointment and band aid. Normal device function. Thresholds, sensing, and impedances consistent with implant measurements. Device programmed at 3.0V for extra safety margin until 3 month visit. Histogram distribution appropriate for patient and level of activity. No mode switches or high ventricular rates noted. Patient educated about wound care, arm mobility, lifting restrictions. ROV in 3 months with GT/R.

## 2016-02-02 ENCOUNTER — Ambulatory Visit (HOSPITAL_COMMUNITY)
Admission: RE | Admit: 2016-02-02 | Discharge: 2016-02-02 | Disposition: A | Discharge status: Home / Self Care | Payer: PPO | Source: Ambulatory Visit | Attending: Family Medicine | Admitting: Family Medicine

## 2016-02-02 DIAGNOSIS — J189 Pneumonia, unspecified organism: Secondary | ICD-10-CM | POA: Insufficient documentation

## 2016-02-03 ENCOUNTER — Ambulatory Visit (INDEPENDENT_AMBULATORY_CARE_PROVIDER_SITE_OTHER): Payer: PPO | Admitting: Family Medicine

## 2016-02-03 ENCOUNTER — Encounter: Payer: Self-pay | Admitting: Family Medicine

## 2016-02-03 VITALS — BP 132/84 | Ht 64.0 in | Wt 262.0 lb

## 2016-02-03 DIAGNOSIS — E785 Hyperlipidemia, unspecified: Secondary | ICD-10-CM | POA: Diagnosis not present

## 2016-02-03 DIAGNOSIS — R7303 Prediabetes: Secondary | ICD-10-CM

## 2016-02-03 NOTE — Progress Notes (Signed)
   Subjective:    Patient ID: [name], female    DOB: 13-Feb-1951, 65 y.o.   MRN: [id number]  HPIpt arrives today to have forms filled out for [facility] due to the following conditions. Mental health condition, anxiety disorders, and mood disorder.    Patient denies having any sleepiness tiredness or mental cognitive changes that would affect her driving. She states overall she is doing well on medication. She is also being followed by cardiology and has been doing well as ALSO   Review of Systems 15 minutes spent with the patient reviewing form talking with her and reviewing her's until health as well as status of her other health issues.    Objective:   Physical Exam  Lungs clear heart regular pulse normal      Assessment & Plan:  Her pacemaker is working good No sign of any bradycardia Mental health stable under the care of psychiatry DMV papers were filled out patient is able to drive Yearly evaluation on this is advisable  Hyperlipidemia check lipid profile Prediabetes check A1c

## 2016-02-06 DIAGNOSIS — R7303 Prediabetes: Secondary | ICD-10-CM | POA: Diagnosis not present

## 2016-02-06 DIAGNOSIS — E785 Hyperlipidemia, unspecified: Secondary | ICD-10-CM | POA: Diagnosis not present

## 2016-02-07 LAB — HEMOGLOBIN A1C
Est. average glucose Bld gHb Est-mCnc: 114 mg/dL
Hgb A1c MFr Bld: 5.6 % (ref 4.8–5.6)

## 2016-02-07 LAB — LIPID PANEL
CHOLESTEROL TOTAL: 140 mg/dL (ref 100–199)
Chol/HDL Ratio: 3 ratio units (ref 0.0–4.4)
HDL: 46 mg/dL (ref >39)
LDL Calculated: 72 mg/dL (ref 0–99)
TRIGLYCERIDES: 110 mg/dL (ref 0–149)
VLDL CHOLESTEROL CAL: 22 mg/dL (ref 5–40)

## 2016-04-07 ENCOUNTER — Encounter: Payer: PPO | Admitting: Internal Medicine

## 2016-04-08 ENCOUNTER — Encounter: Payer: Self-pay | Admitting: Internal Medicine

## 2016-04-08 ENCOUNTER — Ambulatory Visit (INDEPENDENT_AMBULATORY_CARE_PROVIDER_SITE_OTHER): Payer: PPO | Admitting: Internal Medicine

## 2016-04-08 VITALS — BP 131/66 | HR 67 | Ht 64.0 in | Wt 257.0 lb

## 2016-04-08 DIAGNOSIS — Z950 Presence of cardiac pacemaker: Secondary | ICD-10-CM

## 2016-04-08 DIAGNOSIS — I495 Sick sinus syndrome: Secondary | ICD-10-CM | POA: Diagnosis not present

## 2016-04-08 DIAGNOSIS — I10 Essential (primary) hypertension: Secondary | ICD-10-CM | POA: Diagnosis not present

## 2016-04-08 NOTE — Patient Instructions (Signed)
Your physician wants you to follow-up in: 9 Months with Dr.Taylor. You will receive a reminder letter in the mail two months in advance. If you don't receive a letter, please call our office to schedule the follow-up appointment.  Remote monitoring is used to monitor your Pacemaker of ICD from home. This monitoring reduces the number of office visits required to check your device to one time per year. It allows [place] to keep an eye on the functioning of your device to ensure it is working properly. You are scheduled for a device check from home on 07/07/16. You may send your transmission at any time that day. If you have a wireless device, the transmission will be sent automatically. After your physician reviews your transmission, you will receive a postcard with your next transmission date.  If you need a refill on your cardiac medications before your next appointment, please call your pharmacy.  Thank you for choosing [facility]!

## 2016-04-08 NOTE — Progress Notes (Signed)
HPI [name] returns today for ongoing followup of sinus node dysfunction, s/p PPM insertion. She is a pleasant 65 yo woman with symptomatic bradycardia who has felt well since undergoing PPM insertion 3 months ago. She denies pain at [facility] site and no arm swelling. No edema. She is back working, giving flu shots. Allergies  Allergen Reactions  . Imipramine Hives and Rash  . Tricor [Fenofibrate] Other (See Comments)    Pain, "aching"     Current Outpatient Prescriptions  Medication Sig Dispense Refill  . acetaminophen (TYLENOL 8 HOUR) 650 MG CR tablet Take 650 mg by mouth every morning.    [name] ALPRAZolam (XANAX XR) 1 MG 24 hr tablet Take 1 mg by mouth 2 (two) times daily.    [name] ALPRAZolam (XANAX) 1 MG tablet Take 0.5 mg by mouth as directed. Takes with xanax XR 1mg  one bid    . gemfibrozil (LOPID) 600 MG tablet Take 600 mg by mouth 2 (two) times daily.     [name] glucosamine-chondroitin 500-400 MG tablet Take 2 tablets by mouth daily.    [name] lamoTRIgine (LAMICTAL) 200 MG tablet Take 400 mg by mouth at bedtime.     . Multiple Vitamins-Minerals (OCUVITE PO) Take 1 tablet by mouth daily.    . QUEtiapine (SEROQUEL) 200 MG tablet Take 400 mg by mouth at bedtime.     [name] venlafaxine (EFFEXOR) 37.5 MG tablet TAKE ONE-HALF TABLET BY MOUTH ONLY IN THE MORNING    . Vitamin D, Ergocalciferol, (DRISDOL) 50000 UNITS CAPS capsule Take 50,000 Units by mouth every 7 (seven) days.     No current facility-administered medications for this visit.      Past Medical History:  Diagnosis Date  . Anxiety   . Bipolar 1 disorder ([place])   . 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 Breast cancer, left breast ([place]) 07/22/2011  . Cancer [facility]) breast cancer 7 yrs ago  . Depression   . High cholesterol   . Hypertension   . IBS (irritable bowel syndrome)   . Impaired fasting glucose   . OA (osteoarthritis)   . Obesity   . Polycystic ovarian disease   . Presence of  permanent cardiac pacemaker 01/06/2016  . Sleep apnea     ROS:   All systems reviewed and negative except as noted in the HPI.   Past Surgical History:  Procedure Laterality Date  . ABDOMINAL HYSTERECTOMY    . APPENDECTOMY    . BREAST LUMPECTOMY Left 2008  . CHOLECYSTECTOMY    . COLONOSCOPY N/A 09/13/2013   Procedure: COLONOSCOPY;  Surgeon: [name], MD;  [facility];  Service: Endoscopy;  Laterality: N/A;  730  . EP IMPLANTABLE DEVICE N/A 01/06/2016   Procedure: Pacemaker Implant;  Surgeon: [name], MD;  Location: [facility];  Service: Cardiovascular;  Laterality: N/A;  . ESOPHAGOGASTRODUODENOSCOPY N/A 01/17/2014   Procedure: ESOPHAGOGASTRODUODENOSCOPY (EGD);  Surgeon: [name], MD;  [facility];  Service: Endoscopy;  Laterality: N/A;  245  . TOTAL KNEE ARTHROPLASTY Bilateral right knee   2012, 2007     Family History  Problem Relation Age of Onset  . Bipolar disorder Mother   . Diabetes Mother   . Bipolar disorder Sister   . Diabetes Sister   . Alcohol abuse Father   . Hypertension Father   . Heart disease Other      Social History   Social History  .  Marital status: Married    Spouse name: N/A  . Number of children: N/A  . Years of education: N/A   Occupational History  . Not on file.   Social History Main Topics  . Smoking status: Never Smoker  . Smokeless tobacco: Never Used  . Alcohol use No  . Drug use: No  . Sexual activity: No   Other Topics Concern  . Not on file   Social History Narrative   04/24/2013 [name] was born and grew up in [place]. She reports that her childhood was "tough." She has 2 older sisters and a younger brother. She achieved an Associates Degree in nursing. 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 She has 2 daughters and one son. She denies any legal difficulties. She affiliates as Engineer, manufacturing. Her hobbies include reading,  sewing, crafts, and cooking. She reports that her social support system consists of her friend and passed her. 04/24/2013 [name]     BP 131/66   Pulse 67   Ht 5\' 4"  (1.626 m)   Wt 257 lb (116.6 kg)   SpO2 92%   BMI 44.11 kg/m   Physical Exam:  Well appearing NAD HEENT: Unremarkable Neck:  No JVD, no thyromegally Lymphatics:  No adenopathy Back:  No CVA tenderness Lungs:  Clear with no wheezes HEART:  Regular rate rhythm, no murmurs, no rubs, no clicks Abd:  soft, positive bowel sounds, no organomegally, no rebound, no guarding Ext:  2 plus pulses, no edema, no cyanosis, no clubbing Skin:  No rashes no nodules Neuro:  CN II through XII intact, motor grossly intact   DEVICE  Normal device function.  See PaceArt for details.   Assess/Plan: 1. Sinus node dysfunction - she is doing well, s/p PPM 2. PPM - her biotronik DDD PM is working normally.  3. Obesity - she is now walking 8-10 miles a week and has lost 7 lbs. I have encouraged her to keep this up.  GreggTaylor,M.D.

## 2016-04-14 DIAGNOSIS — F3163 Bipolar disorder, current episode mixed, severe, without psychotic features: Secondary | ICD-10-CM | POA: Diagnosis not present

## 2016-04-30 LAB — CUP PACEART INCLINIC DEVICE CHECK
Brady Statistic RA Percent Paced: 47 %
Brady Statistic RV Percent Paced: 0 %
Date Time Interrogation Session: 20170928123900
Implantable Lead Implant Date: 20170627
Implantable Lead Location: 753860
Implantable Lead Model: 377
Lead Channel Impedance Value: 663 Ohm
Lead Channel Pacing Threshold Amplitude: 1 V
Lead Channel Pacing Threshold Amplitude: 1 V
Lead Channel Pacing Threshold Pulse Width: 0.4 ms
Lead Channel Setting Pacing Amplitude: 2 V
MDC IDC LEAD IMPLANT DT: 20170627
MDC IDC LEAD LOCATION: 753859
MDC IDC LEAD SERIAL: 49436227
MDC IDC LEAD SERIAL: 49471106
MDC IDC MSMT LEADCHNL RA IMPEDANCE VALUE: 507 Ohm
MDC IDC MSMT LEADCHNL RA SENSING INTR AMPL: 3.6 mV
MDC IDC MSMT LEADCHNL RA SENSING INTR AMPL: 3.7 mV
MDC IDC MSMT LEADCHNL RV PACING THRESHOLD PULSEWIDTH: 0.4 ms
MDC IDC MSMT LEADCHNL RV SENSING INTR AMPL: 13 mV
MDC IDC SET LEADCHNL RV PACING AMPLITUDE: 2.4 V
MDC IDC SET LEADCHNL RV PACING PULSEWIDTH: 0.4 ms
Pulse Gen Model: 394969
Pulse Gen Serial Number: 68786455

## 2016-05-04 ENCOUNTER — Telehealth: Payer: Self-pay | Admitting: Family Medicine

## 2016-05-04 MED ORDER — PREDNISONE 20 MG PO TABS
ORAL_TABLET | ORAL | 0 refills | Status: DC
Start: 2016-05-04 — End: 2016-08-17

## 2016-05-04 NOTE — Telephone Encounter (Signed)
Nurses please talk with the patient find out what she would prefer she has choices choice #1 steroid cream apply twice a day can often take 10-14 day as for things to clear up choice #2 prednisone 20 mg tablet #18 3qd for 3d then 2qd for 3d then 1qd for 3d But prednisone can typically increase sugars going up as well as increased appetite

## 2016-05-04 NOTE — Telephone Encounter (Signed)
Discussed with patient. Patient states she thinks she needs prednisone. Prescription sent electronically to the pharmacy . Patient notified

## 2016-05-04 NOTE — Telephone Encounter (Signed)
Patient believes she has poison ivy on her arms and would like something called in.  Please advise.   [place]

## 2016-05-04 NOTE — Telephone Encounter (Signed)
Previous note should've been sent to the pool for the nurses

## 2016-05-07 ENCOUNTER — Encounter: Payer: Self-pay | Admitting: Internal Medicine

## 2016-05-10 DIAGNOSIS — F3163 Bipolar disorder, current episode mixed, severe, without psychotic features: Secondary | ICD-10-CM | POA: Diagnosis not present

## 2016-05-14 ENCOUNTER — Ambulatory Visit (HOSPITAL_COMMUNITY)
Admission: EM | Admit: 2016-05-14 | Discharge: 2016-05-14 | Disposition: A | Discharge status: Home / Self Care | Payer: PPO | Attending: Family Medicine | Admitting: Family Medicine

## 2016-05-14 ENCOUNTER — Encounter (HOSPITAL_COMMUNITY): Payer: Self-pay | Admitting: Emergency Medicine

## 2016-05-14 DIAGNOSIS — R21 Rash and other nonspecific skin eruption: Secondary | ICD-10-CM

## 2016-05-14 MED ORDER — IVERMECTIN 3 MG PO TABS: 12.0000 mg | ORAL_TABLET | Freq: Once | ORAL | 0 refills | Status: AC

## 2016-05-14 MED ORDER — TRIAMCINOLONE ACETONIDE 0.1 % EX CREA: 1.0000 "application " | TOPICAL_CREAM | Freq: Two times a day (BID) | CUTANEOUS | 0 refills | Status: DC

## 2016-05-14 NOTE — ED Provider Notes (Signed)
[place]    CSN: [id number] Arrival date & time: 05/14/16  1726     History   Chief Complaint Chief Complaint  Patient presents with  . Rash    HPI [name] is a 65 y.o. female.   This 65 year old woman comes in for evaluation of rash. She had been working out in the yard doing half weeks ago and then 10 days ago broke out in a rash on her forearms and lower biceps area. She's had no rash in between her fingers. She has no rash anywhere else.  She called her doctor thinking that this might be poison ivy and he called in and prednisone Dosepak which helped a little bit. Patient lives with her cat. Nobody else lives in the house with her.      Past Medical History:  Diagnosis Date  . Anxiety   . Bipolar 1 disorder ([place])   . 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 Breast cancer, left breast ([place]) 07/22/2011  . Cancer [facility]) breast cancer 7 yrs ago  . Depression   . High cholesterol   . Hypertension   . IBS (irritable bowel syndrome)   . Impaired fasting glucose   . OA (osteoarthritis)   . Obesity   . Polycystic ovarian disease   . 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 Sleep apnea     Patient Active Problem List   Diagnosis Date Noted  . Sinus node dysfunction ([place]) 01/06/2016  . Bradycardia 10/22/2015  . Bipolar disorder ([place]) 10/22/2015  . Anxiety 10/22/2015  . Pre-syncope 10/22/2015  . Episodic lightheadedness   . GERD (gastroesophageal reflux disease) 01/08/2014  . Hypertension 11/28/2013  . Palpitations 11/28/2013  . Osteoarthritis 11/08/2013  . Lumbar back pain 11/08/2013  . Hypertriglyceridemia 08/06/2013  . Depression 04/20/2013  . Breast cancer, left breast ([place]) 07/22/2011    Past Surgical History:  Procedure Laterality Date  . ABDOMINAL HYSTERECTOMY    . APPENDECTOMY    . BREAST LUMPECTOMY Left 2008  . CHOLECYSTECTOMY    . COLONOSCOPY N/A 09/13/2013   Procedure:  COLONOSCOPY;  Surgeon: [name], MD;  [facility];  Service: Endoscopy;  Laterality: N/A;  730  . EP IMPLANTABLE DEVICE N/A 01/06/2016   Procedure: Pacemaker Implant;  Surgeon: [name], MD;  Location: [place] CV LAB;  Service: Cardiovascular;  Laterality: N/A;  . ESOPHAGOGASTRODUODENOSCOPY N/A 01/17/2014   Procedure: ESOPHAGOGASTRODUODENOSCOPY (EGD);  Surgeon: [name], MD;  [facility];  Service: Endoscopy;  Laterality: N/A;  245  . TOTAL KNEE ARTHROPLASTY Bilateral right knee   2012, 2007    OB History    No data available       Home Medications    Prior to Admission medications   Medication Sig Start Date End Date Taking? Authorizing Provider  acetaminophen (TYLENOL 8 HOUR) 650 MG CR tablet Take 650 mg by mouth every morning.   Yes Historical Provider, MD  ALPRAZolam (XANAX XR) 1 MG 24 hr tablet Take 1 mg by mouth 2 (two) times daily.   Yes Historical Provider, MD  ALPRAZolam [name]) 1 MG tablet Take 0.5 mg by mouth as directed. Takes with xanax XR 1mg  one bid   Yes Historical Provider, MD  gemfibrozil (LOPID) 600 MG tablet Take 600 mg by mouth 2 (two) times daily.    Yes Historical Provider, MD  glucosamine-chondroitin 500-400 MG tablet Take 2 tablets by mouth daily.  Yes Historical Provider, MD  lamoTRIgine (LAMICTAL) 200 MG tablet Take 400 mg by mouth at bedtime.    Yes Historical Provider, MD  predniSONE (DELTASONE) 20 MG tablet 3qd for 3d then 2qd for 3d then 1qd for 3d 05/04/16  Yes Kathyrn Drown, MD  QUEtiapine (SEROQUEL) 200 MG tablet Take 400 mg by mouth at bedtime.  05/07/13  Yes [name], MD  Vitamin D, Ergocalciferol, (DRISDOL) 50000 UNITS CAPS capsule Take 50,000 Units by mouth every 7 (seven) days.   Yes Historical Provider, MD  ivermectin (STROMECTOL) 3 MG TABS tablet Take 4 tablets (12 mg total) by mouth once. 05/14/16 05/14/16  Robyn Haber, MD  Multiple Vitamins-Minerals (OCUVITE PO) Take 1 tablet by mouth  daily.    Historical Provider, MD  triamcinolone cream (KENALOG) 0.1 % Apply 1 application topically 2 (two) times daily. 05/14/16   Robyn Haber, MD    Family History Family History  Problem Relation Age of Onset  . Bipolar disorder Mother   . Diabetes Mother   . Bipolar disorder Sister   . Diabetes Sister   . Alcohol abuse Father   . Hypertension Father   . Heart disease Other     Social History Social History  Substance Use Topics  . Smoking status: Never Smoker  . Smokeless tobacco: Never Used  . Alcohol use No     Allergies   Imipramine and Tricor [fenofibrate]   Review of Systems Review of Systems  Constitutional: Negative.   HENT: Negative.   Respiratory: Negative.   Cardiovascular: Negative.   Gastrointestinal: Negative.   Musculoskeletal: Negative.   Skin: Positive for rash.     Physical Exam Triage Vital Signs ED Triage Vitals [05/14/16 1740]  Enc Vitals Group     BP 139/73     Pulse Rate 72     Resp 22     Temp 98 F (36.7 C)     Temp Source Oral     SpO2 100 %     Weight      Height      Head Circumference      Peak Flow      Pain Score      Pain Loc      Pain Edu?      Excl. in [place]?    No data found.   Updated Vital Signs BP 139/73 (BP Location: Left Arm)   Pulse 72   Temp 98 F (36.7 C) (Oral)   Resp 22   SpO2 100%    Physical Exam  Constitutional: She is oriented to person, place, and time. She appears well-developed and well-nourished.  HENT:  Head: Normocephalic and atraumatic.  Right Ear: External ear normal.  Left Ear: External ear normal.  Mouth/Throat: Oropharynx is clear and moist.  Eyes: Conjunctivae and EOM are normal. Pupils are equal, round, and reactive to light.  Neck: Normal range of motion. Neck supple.  Pulmonary/Chest: Effort normal.  Musculoskeletal: Normal range of motion.  Neurological: She is alert and oriented to person, place, and time.  Skin: Skin is warm and dry. Rash noted.  Patient has  multiple excoriated papules, many of which are in a linear distribution, on her forearms and lower arms. No interdigital rash.  Patient has no rash on her abdomen, groin or gluteal area.  Nursing note and vitals reviewed.    UC Treatments / Results  Labs (all labs ordered are listed, but only abnormal results are displayed) Labs Reviewed - No data to  display  EKG  EKG Interpretation None       Radiology No results found.  Procedures Procedures (including critical care time)  Medications Ordered in UC Medications - No data to display   Initial Impression / Assessment and Plan / UC Course  I have reviewed the triage vital signs and the nursing notes.  Pertinent labs & imaging results that were available during my care of the patient were reviewed by me and considered in my medical decision making (see chart for details).  Clinical Course    Final Clinical Impressions(s) / UC Diagnoses   Final diagnoses:  Rash    New Prescriptions New Prescriptions   IVERMECTIN (STROMECTOL) 3 MG TABS TABLET    Take 4 tablets (12 mg total) by mouth once.   TRIAMCINOLONE CREAM (KENALOG) 0.1 %    Apply 1 application topically 2 (two) times daily.     Robyn Haber, MD 05/14/16 1800

## 2016-05-14 NOTE — ED Triage Notes (Signed)
Here for rash on bilateral arm onset 2 weeks +++  Denies fevers, chills  Reports PCP gave her prednisone w/no relief over the phone  A&O x4... NAD

## 2016-05-14 NOTE — Discharge Instructions (Signed)
This rash has many of the features of scabies. I'm treating her with a cortisone type product for ear itching as well as medicine to kill any of these possible mites.

## 2016-06-14 DIAGNOSIS — F3176 Bipolar disorder, in full remission, most recent episode depressed: Secondary | ICD-10-CM | POA: Diagnosis not present

## 2016-06-14 DIAGNOSIS — F3174 Bipolar disorder, in full remission, most recent episode manic: Secondary | ICD-10-CM | POA: Diagnosis not present

## 2016-06-14 DIAGNOSIS — F3163 Bipolar disorder, current episode mixed, severe, without psychotic features: Secondary | ICD-10-CM | POA: Diagnosis not present

## 2016-06-24 DIAGNOSIS — F3163 Bipolar disorder, current episode mixed, severe, without psychotic features: Secondary | ICD-10-CM | POA: Diagnosis not present

## 2016-07-07 ENCOUNTER — Ambulatory Visit (INDEPENDENT_AMBULATORY_CARE_PROVIDER_SITE_OTHER): Payer: PPO | Admitting: *Deleted

## 2016-07-07 DIAGNOSIS — I495 Sick sinus syndrome: Secondary | ICD-10-CM | POA: Diagnosis not present

## 2016-07-08 NOTE — Progress Notes (Signed)
Remote pacemaker transmission.   

## 2016-07-09 ENCOUNTER — Encounter: Payer: Self-pay | Admitting: Cardiology

## 2016-07-09 LAB — CUP PACEART REMOTE DEVICE CHECK
Brady Statistic RA Percent Paced: 37 %
Implantable Lead Implant Date: 20170627
Implantable Lead Implant Date: 20170627
Implantable Lead Model: 377
Implantable Lead Serial Number: 49436227
Implantable Pulse Generator Implant Date: 20170627
Lead Channel Impedance Value: 480 Ohm
Lead Channel Setting Pacing Amplitude: 2.4 V
MDC IDC LEAD LOCATION: 753859
MDC IDC LEAD LOCATION: 753860
MDC IDC LEAD SERIAL: 49471106
MDC IDC MSMT LEADCHNL RV IMPEDANCE VALUE: 630 Ohm
MDC IDC PG SERIAL: 68786455
MDC IDC SESS DTM: 20171229165216
MDC IDC SET LEADCHNL RA PACING AMPLITUDE: 2 V
MDC IDC SET LEADCHNL RV PACING PULSEWIDTH: 0.4 ms
MDC IDC STAT BRADY RV PERCENT PACED: 0 %

## 2016-07-26 DIAGNOSIS — F3163 Bipolar disorder, current episode mixed, severe, without psychotic features: Secondary | ICD-10-CM | POA: Diagnosis not present

## 2016-07-29 ENCOUNTER — Encounter: Payer: Self-pay | Admitting: Internal Medicine

## 2016-08-02 ENCOUNTER — Telehealth: Payer: Self-pay | Admitting: Internal Medicine

## 2016-08-02 NOTE — Telephone Encounter (Signed)
Spoke w/ pt and she informed me that she would be gone for about a week. I informed her that she did not have to take her monitor w/ her on her trip. Pt verbalized understanding.

## 2016-08-02 NOTE — Telephone Encounter (Signed)
New Message    Pt is leaving for [place] 08/03/16 does she need to take the monitor with her for her pace maker?

## 2016-08-17 ENCOUNTER — Ambulatory Visit (HOSPITAL_COMMUNITY)
Admission: RE | Admit: 2016-08-17 | Discharge: 2016-08-17 | Disposition: A | Discharge status: Home / Self Care | Payer: PPO | Source: Ambulatory Visit | Attending: Family Medicine | Admitting: Family Medicine

## 2016-08-17 ENCOUNTER — Ambulatory Visit (INDEPENDENT_AMBULATORY_CARE_PROVIDER_SITE_OTHER): Payer: PPO | Admitting: Family Medicine

## 2016-08-17 ENCOUNTER — Encounter: Payer: Self-pay | Admitting: Family Medicine

## 2016-08-17 VITALS — BP 136/88 | Ht 64.0 in | Wt 258.0 lb

## 2016-08-17 DIAGNOSIS — M533 Sacrococcygeal disorders, not elsewhere classified: Secondary | ICD-10-CM | POA: Insufficient documentation

## 2016-08-17 DIAGNOSIS — M25551 Pain in right hip: Secondary | ICD-10-CM | POA: Diagnosis not present

## 2016-08-17 MED ORDER — MELOXICAM 15 MG PO TABS: 15.0000 mg | ORAL_TABLET | Freq: Every day | ORAL | 1 refills | Status: DC

## 2016-08-17 MED ORDER — DICLOFENAC SODIUM 1 % TD GEL: 2.0000 g | Freq: Four times a day (QID) | TRANSDERMAL | 12 refills | Status: DC

## 2016-08-17 NOTE — Progress Notes (Signed)
   Subjective:    Patient ID: [name], female    DOB: 12-01-1950, 66 y.o.   MRN: [id number]  Hip Pain   Incident onset: 3 and a half weeks ago. There was no injury mechanism. The pain is present in the right hip. She has tried acetaminophen, ice and heat (hydrocodone) for the symptoms. The treatment provided no relief.  This patient is having some low back pain on the right side some hip pain some pain that radiates into the leg a little bit denies high fever chills denies nausea vomiting diarrhea denies dysuria rectal bleeding    Review of Systems Please see above    Objective:   Physical Exam Lungs clear hearts regular low back some tenderness in the right sacroiliac region has slight increased pain in this region with straight leg raise but no true sciatica hip has decent range of motion       Assessment & Plan:  Low back pain with possible sacroiliac involvement anti-inflammatory prescribed stretching exercises if ongoing troubles referral for orthopedics x-rays ordered await the results

## 2016-08-23 DIAGNOSIS — F3163 Bipolar disorder, current episode mixed, severe, without psychotic features: Secondary | ICD-10-CM | POA: Diagnosis not present

## 2016-09-09 ENCOUNTER — Other Ambulatory Visit: Payer: Self-pay | Admitting: Family Medicine

## 2016-09-09 DIAGNOSIS — Z1231 Encounter for screening mammogram for malignant neoplasm of breast: Secondary | ICD-10-CM

## 2016-09-20 DIAGNOSIS — F3163 Bipolar disorder, current episode mixed, severe, without psychotic features: Secondary | ICD-10-CM | POA: Diagnosis not present

## 2016-09-27 ENCOUNTER — Ambulatory Visit
Admission: RE | Admit: 2016-09-27 | Discharge: 2016-09-27 | Disposition: A | Discharge status: Home / Self Care | Payer: PPO | Source: Ambulatory Visit | Attending: Family Medicine | Admitting: Family Medicine

## 2016-09-27 ENCOUNTER — Encounter: Payer: Self-pay | Admitting: Family Medicine

## 2016-09-27 DIAGNOSIS — Z1231 Encounter for screening mammogram for malignant neoplasm of breast: Secondary | ICD-10-CM | POA: Diagnosis not present

## 2016-09-27 DIAGNOSIS — M25551 Pain in right hip: Secondary | ICD-10-CM

## 2016-09-27 HISTORY — DX: Personal history of irradiation: Z92.3

## 2016-09-27 HISTORY — DX: Personal history of antineoplastic chemotherapy: Z92.21

## 2016-09-28 NOTE — Telephone Encounter (Signed)
More than likely this is impingement of the nerve in the lower back that is radiating to the hip and causing some numbness into the leg I recommend referral to [facility] orthopedics Dr. Rolena Infante if possible-please inform the patient it would be in her best interest for her to get further evaluation from them we will help set up this appointment

## 2016-09-29 NOTE — Addendum Note (Signed)
Addended by: Dairl Ponder on: 09/29/2016 11:22 AM   Modules accepted: Orders

## 2016-10-04 DIAGNOSIS — F3163 Bipolar disorder, current episode mixed, severe, without psychotic features: Secondary | ICD-10-CM | POA: Diagnosis not present

## 2016-10-06 ENCOUNTER — Ambulatory Visit (INDEPENDENT_AMBULATORY_CARE_PROVIDER_SITE_OTHER): Payer: PPO | Admitting: *Deleted

## 2016-10-06 DIAGNOSIS — I495 Sick sinus syndrome: Secondary | ICD-10-CM

## 2016-10-06 NOTE — Progress Notes (Signed)
Remote pacemaker transmission.   

## 2016-10-08 ENCOUNTER — Encounter: Payer: Self-pay | Admitting: Cardiology

## 2016-10-11 LAB — CUP PACEART REMOTE DEVICE CHECK
Brady Statistic AP VP Percent: 0 %
Brady Statistic RA Percent Paced: 34 %
Implantable Lead Implant Date: 20170627
Implantable Lead Implant Date: 20170627
Implantable Lead Location: 753859
Implantable Lead Location: 753860
Implantable Lead Model: 377
Implantable Lead Serial Number: 49471106
Implantable Pulse Generator Implant Date: 20170627
Lead Channel Impedance Value: 410 Ohm
Lead Channel Impedance Value: 624 Ohm
Lead Channel Pacing Threshold Amplitude: 0.8 V
Lead Channel Pacing Threshold Pulse Width: 0.4 ms
Lead Channel Pacing Threshold Pulse Width: 0.4 ms
Lead Channel Setting Pacing Amplitude: 2 V
Lead Channel Setting Pacing Amplitude: 2.4 V
Lead Channel Setting Pacing Pulse Width: 0.4 ms
MDC IDC LEAD SERIAL: 49436227
MDC IDC MSMT LEADCHNL RV PACING THRESHOLD AMPLITUDE: 0.8 V
MDC IDC SESS DTM: 20180402053557
MDC IDC STAT BRADY AP VS PERCENT: 34 %
MDC IDC STAT BRADY AS VP PERCENT: 0 %
MDC IDC STAT BRADY AS VS PERCENT: 66 %
MDC IDC STAT BRADY RV PERCENT PACED: 0 %
Pulse Gen Serial Number: 68786455

## 2016-10-15 ENCOUNTER — Encounter: Payer: Self-pay | Admitting: Internal Medicine

## 2016-10-20 DIAGNOSIS — M5136 Other intervertebral disc degeneration, lumbar region: Secondary | ICD-10-CM | POA: Diagnosis not present

## 2016-10-21 ENCOUNTER — Encounter: Payer: Self-pay | Admitting: Family Medicine

## 2016-10-21 NOTE — Telephone Encounter (Signed)
Nurse's-patient may have 3 refills regarding her medication or 90 days either. In addition to this patient needs to do follow-up office visit by this summer with lab work

## 2016-10-22 ENCOUNTER — Other Ambulatory Visit: Payer: Self-pay | Admitting: *Deleted

## 2016-10-22 DIAGNOSIS — I495 Sick sinus syndrome: Secondary | ICD-10-CM

## 2016-10-22 MED ORDER — GEMFIBROZIL 600 MG PO TABS: 600.0000 mg | ORAL_TABLET | Freq: Two times a day (BID) | ORAL | 2 refills | Status: DC

## 2016-10-25 DIAGNOSIS — F3163 Bipolar disorder, current episode mixed, severe, without psychotic features: Secondary | ICD-10-CM | POA: Diagnosis not present

## 2016-11-03 DIAGNOSIS — M5136 Other intervertebral disc degeneration, lumbar region: Secondary | ICD-10-CM | POA: Diagnosis not present

## 2016-11-08 ENCOUNTER — Other Ambulatory Visit (HOSPITAL_COMMUNITY): Payer: Self-pay | Admitting: Orthopedic Surgery

## 2016-11-08 DIAGNOSIS — M51369 Other intervertebral disc degeneration, lumbar region without mention of lumbar back pain or lower extremity pain: Secondary | ICD-10-CM

## 2016-11-08 DIAGNOSIS — M5136 Other intervertebral disc degeneration, lumbar region: Secondary | ICD-10-CM

## 2016-11-17 ENCOUNTER — Ambulatory Visit (HOSPITAL_COMMUNITY)
Admission: RE | Admit: 2016-11-17 | Discharge: 2016-11-17 | Disposition: A | Discharge status: Home / Self Care | Payer: PPO | Source: Ambulatory Visit | Attending: Orthopedic Surgery | Admitting: Orthopedic Surgery

## 2016-11-17 DIAGNOSIS — M48061 Spinal stenosis, lumbar region without neurogenic claudication: Secondary | ICD-10-CM | POA: Diagnosis not present

## 2016-11-17 DIAGNOSIS — M5136 Other intervertebral disc degeneration, lumbar region: Secondary | ICD-10-CM

## 2016-11-17 DIAGNOSIS — M2578 Osteophyte, vertebrae: Secondary | ICD-10-CM | POA: Insufficient documentation

## 2016-11-17 DIAGNOSIS — M47897 Other spondylosis, lumbosacral region: Secondary | ICD-10-CM | POA: Insufficient documentation

## 2016-11-17 DIAGNOSIS — K5730 Diverticulosis of large intestine without perforation or abscess without bleeding: Secondary | ICD-10-CM | POA: Diagnosis not present

## 2016-11-17 DIAGNOSIS — M5116 Intervertebral disc disorders with radiculopathy, lumbar region: Secondary | ICD-10-CM | POA: Insufficient documentation

## 2016-11-17 DIAGNOSIS — M545 Low back pain: Secondary | ICD-10-CM | POA: Diagnosis not present

## 2016-11-22 DIAGNOSIS — F3163 Bipolar disorder, current episode mixed, severe, without psychotic features: Secondary | ICD-10-CM | POA: Diagnosis not present

## 2016-12-01 DIAGNOSIS — S39012A Strain of muscle, fascia and tendon of lower back, initial encounter: Secondary | ICD-10-CM | POA: Diagnosis not present

## 2016-12-01 DIAGNOSIS — M5136 Other intervertebral disc degeneration, lumbar region: Secondary | ICD-10-CM | POA: Diagnosis not present

## 2016-12-15 DIAGNOSIS — X500XXS Overexertion from strenuous movement or load, sequela: Secondary | ICD-10-CM | POA: Diagnosis not present

## 2016-12-15 DIAGNOSIS — R262 Difficulty in walking, not elsewhere classified: Secondary | ICD-10-CM | POA: Diagnosis not present

## 2016-12-15 DIAGNOSIS — M2560 Stiffness of unspecified joint, not elsewhere classified: Secondary | ICD-10-CM | POA: Diagnosis not present

## 2016-12-15 DIAGNOSIS — M5441 Lumbago with sciatica, right side: Secondary | ICD-10-CM | POA: Diagnosis not present

## 2016-12-15 DIAGNOSIS — M6249 Contracture of muscle, multiple sites: Secondary | ICD-10-CM | POA: Diagnosis not present

## 2016-12-15 DIAGNOSIS — F39 Unspecified mood [affective] disorder: Secondary | ICD-10-CM | POA: Diagnosis not present

## 2016-12-15 DIAGNOSIS — M5416 Radiculopathy, lumbar region: Secondary | ICD-10-CM | POA: Diagnosis not present

## 2016-12-15 DIAGNOSIS — M6281 Muscle weakness (generalized): Secondary | ICD-10-CM | POA: Diagnosis not present

## 2016-12-15 DIAGNOSIS — M6259 Muscle wasting and atrophy, not elsewhere classified, multiple sites: Secondary | ICD-10-CM | POA: Diagnosis not present

## 2016-12-15 DIAGNOSIS — M545 Low back pain: Secondary | ICD-10-CM | POA: Diagnosis not present

## 2016-12-15 DIAGNOSIS — E669 Obesity, unspecified: Secondary | ICD-10-CM | POA: Diagnosis not present

## 2016-12-16 DIAGNOSIS — M6259 Muscle wasting and atrophy, not elsewhere classified, multiple sites: Secondary | ICD-10-CM | POA: Diagnosis not present

## 2016-12-16 DIAGNOSIS — M6249 Contracture of muscle, multiple sites: Secondary | ICD-10-CM | POA: Diagnosis not present

## 2016-12-16 DIAGNOSIS — M6281 Muscle weakness (generalized): Secondary | ICD-10-CM | POA: Diagnosis not present

## 2016-12-16 DIAGNOSIS — R262 Difficulty in walking, not elsewhere classified: Secondary | ICD-10-CM | POA: Diagnosis not present

## 2016-12-16 DIAGNOSIS — F39 Unspecified mood [affective] disorder: Secondary | ICD-10-CM | POA: Diagnosis not present

## 2016-12-16 DIAGNOSIS — X500XXS Overexertion from strenuous movement or load, sequela: Secondary | ICD-10-CM | POA: Diagnosis not present

## 2016-12-16 DIAGNOSIS — M2560 Stiffness of unspecified joint, not elsewhere classified: Secondary | ICD-10-CM | POA: Diagnosis not present

## 2016-12-16 DIAGNOSIS — M545 Low back pain: Secondary | ICD-10-CM | POA: Diagnosis not present

## 2016-12-16 DIAGNOSIS — E669 Obesity, unspecified: Secondary | ICD-10-CM | POA: Diagnosis not present

## 2016-12-16 DIAGNOSIS — M5416 Radiculopathy, lumbar region: Secondary | ICD-10-CM | POA: Diagnosis not present

## 2016-12-16 DIAGNOSIS — M5441 Lumbago with sciatica, right side: Secondary | ICD-10-CM | POA: Diagnosis not present

## 2016-12-20 DIAGNOSIS — M2560 Stiffness of unspecified joint, not elsewhere classified: Secondary | ICD-10-CM | POA: Diagnosis not present

## 2016-12-20 DIAGNOSIS — R262 Difficulty in walking, not elsewhere classified: Secondary | ICD-10-CM | POA: Diagnosis not present

## 2016-12-20 DIAGNOSIS — F39 Unspecified mood [affective] disorder: Secondary | ICD-10-CM | POA: Diagnosis not present

## 2016-12-20 DIAGNOSIS — M5416 Radiculopathy, lumbar region: Secondary | ICD-10-CM | POA: Diagnosis not present

## 2016-12-20 DIAGNOSIS — E669 Obesity, unspecified: Secondary | ICD-10-CM | POA: Diagnosis not present

## 2016-12-20 DIAGNOSIS — X500XXS Overexertion from strenuous movement or load, sequela: Secondary | ICD-10-CM | POA: Diagnosis not present

## 2016-12-20 DIAGNOSIS — F3163 Bipolar disorder, current episode mixed, severe, without psychotic features: Secondary | ICD-10-CM | POA: Diagnosis not present

## 2016-12-20 DIAGNOSIS — M5441 Lumbago with sciatica, right side: Secondary | ICD-10-CM | POA: Diagnosis not present

## 2016-12-20 DIAGNOSIS — M6281 Muscle weakness (generalized): Secondary | ICD-10-CM | POA: Diagnosis not present

## 2016-12-20 DIAGNOSIS — M6249 Contracture of muscle, multiple sites: Secondary | ICD-10-CM | POA: Diagnosis not present

## 2016-12-20 DIAGNOSIS — M6259 Muscle wasting and atrophy, not elsewhere classified, multiple sites: Secondary | ICD-10-CM | POA: Diagnosis not present

## 2016-12-20 DIAGNOSIS — M545 Low back pain: Secondary | ICD-10-CM | POA: Diagnosis not present

## 2016-12-22 DIAGNOSIS — F39 Unspecified mood [affective] disorder: Secondary | ICD-10-CM | POA: Diagnosis not present

## 2016-12-22 DIAGNOSIS — M6249 Contracture of muscle, multiple sites: Secondary | ICD-10-CM | POA: Diagnosis not present

## 2016-12-22 DIAGNOSIS — M5416 Radiculopathy, lumbar region: Secondary | ICD-10-CM | POA: Diagnosis not present

## 2016-12-22 DIAGNOSIS — E669 Obesity, unspecified: Secondary | ICD-10-CM | POA: Diagnosis not present

## 2016-12-22 DIAGNOSIS — M6281 Muscle weakness (generalized): Secondary | ICD-10-CM | POA: Diagnosis not present

## 2016-12-22 DIAGNOSIS — M545 Low back pain: Secondary | ICD-10-CM | POA: Diagnosis not present

## 2016-12-22 DIAGNOSIS — X500XXS Overexertion from strenuous movement or load, sequela: Secondary | ICD-10-CM | POA: Diagnosis not present

## 2016-12-22 DIAGNOSIS — M6259 Muscle wasting and atrophy, not elsewhere classified, multiple sites: Secondary | ICD-10-CM | POA: Diagnosis not present

## 2016-12-22 DIAGNOSIS — R262 Difficulty in walking, not elsewhere classified: Secondary | ICD-10-CM | POA: Diagnosis not present

## 2016-12-22 DIAGNOSIS — M2560 Stiffness of unspecified joint, not elsewhere classified: Secondary | ICD-10-CM | POA: Diagnosis not present

## 2016-12-22 DIAGNOSIS — M5441 Lumbago with sciatica, right side: Secondary | ICD-10-CM | POA: Diagnosis not present

## 2016-12-23 DIAGNOSIS — M5441 Lumbago with sciatica, right side: Secondary | ICD-10-CM | POA: Diagnosis not present

## 2016-12-23 DIAGNOSIS — M2560 Stiffness of unspecified joint, not elsewhere classified: Secondary | ICD-10-CM | POA: Diagnosis not present

## 2016-12-23 DIAGNOSIS — R262 Difficulty in walking, not elsewhere classified: Secondary | ICD-10-CM | POA: Diagnosis not present

## 2016-12-23 DIAGNOSIS — X500XXS Overexertion from strenuous movement or load, sequela: Secondary | ICD-10-CM | POA: Diagnosis not present

## 2016-12-23 DIAGNOSIS — M6249 Contracture of muscle, multiple sites: Secondary | ICD-10-CM | POA: Diagnosis not present

## 2016-12-23 DIAGNOSIS — M6259 Muscle wasting and atrophy, not elsewhere classified, multiple sites: Secondary | ICD-10-CM | POA: Diagnosis not present

## 2016-12-23 DIAGNOSIS — M6281 Muscle weakness (generalized): Secondary | ICD-10-CM | POA: Diagnosis not present

## 2016-12-23 DIAGNOSIS — E669 Obesity, unspecified: Secondary | ICD-10-CM | POA: Diagnosis not present

## 2016-12-23 DIAGNOSIS — M545 Low back pain: Secondary | ICD-10-CM | POA: Diagnosis not present

## 2016-12-23 DIAGNOSIS — M5416 Radiculopathy, lumbar region: Secondary | ICD-10-CM | POA: Diagnosis not present

## 2016-12-23 DIAGNOSIS — F39 Unspecified mood [affective] disorder: Secondary | ICD-10-CM | POA: Diagnosis not present

## 2016-12-27 DIAGNOSIS — R262 Difficulty in walking, not elsewhere classified: Secondary | ICD-10-CM | POA: Diagnosis not present

## 2016-12-27 DIAGNOSIS — M2560 Stiffness of unspecified joint, not elsewhere classified: Secondary | ICD-10-CM | POA: Diagnosis not present

## 2016-12-27 DIAGNOSIS — M6259 Muscle wasting and atrophy, not elsewhere classified, multiple sites: Secondary | ICD-10-CM | POA: Diagnosis not present

## 2016-12-27 DIAGNOSIS — X500XXS Overexertion from strenuous movement or load, sequela: Secondary | ICD-10-CM | POA: Diagnosis not present

## 2016-12-27 DIAGNOSIS — M6249 Contracture of muscle, multiple sites: Secondary | ICD-10-CM | POA: Diagnosis not present

## 2016-12-27 DIAGNOSIS — M6281 Muscle weakness (generalized): Secondary | ICD-10-CM | POA: Diagnosis not present

## 2016-12-27 DIAGNOSIS — M5441 Lumbago with sciatica, right side: Secondary | ICD-10-CM | POA: Diagnosis not present

## 2016-12-27 DIAGNOSIS — M545 Low back pain: Secondary | ICD-10-CM | POA: Diagnosis not present

## 2016-12-27 DIAGNOSIS — M5416 Radiculopathy, lumbar region: Secondary | ICD-10-CM | POA: Diagnosis not present

## 2016-12-27 DIAGNOSIS — E669 Obesity, unspecified: Secondary | ICD-10-CM | POA: Diagnosis not present

## 2016-12-27 DIAGNOSIS — F39 Unspecified mood [affective] disorder: Secondary | ICD-10-CM | POA: Diagnosis not present

## 2016-12-29 DIAGNOSIS — M5416 Radiculopathy, lumbar region: Secondary | ICD-10-CM | POA: Diagnosis not present

## 2016-12-29 DIAGNOSIS — E669 Obesity, unspecified: Secondary | ICD-10-CM | POA: Diagnosis not present

## 2016-12-29 DIAGNOSIS — M5441 Lumbago with sciatica, right side: Secondary | ICD-10-CM | POA: Diagnosis not present

## 2016-12-29 DIAGNOSIS — M2560 Stiffness of unspecified joint, not elsewhere classified: Secondary | ICD-10-CM | POA: Diagnosis not present

## 2016-12-29 DIAGNOSIS — F39 Unspecified mood [affective] disorder: Secondary | ICD-10-CM | POA: Diagnosis not present

## 2016-12-29 DIAGNOSIS — M6259 Muscle wasting and atrophy, not elsewhere classified, multiple sites: Secondary | ICD-10-CM | POA: Diagnosis not present

## 2016-12-29 DIAGNOSIS — M6281 Muscle weakness (generalized): Secondary | ICD-10-CM | POA: Diagnosis not present

## 2016-12-29 DIAGNOSIS — M545 Low back pain: Secondary | ICD-10-CM | POA: Diagnosis not present

## 2016-12-29 DIAGNOSIS — M6249 Contracture of muscle, multiple sites: Secondary | ICD-10-CM | POA: Diagnosis not present

## 2016-12-29 DIAGNOSIS — X500XXS Overexertion from strenuous movement or load, sequela: Secondary | ICD-10-CM | POA: Diagnosis not present

## 2016-12-29 DIAGNOSIS — R262 Difficulty in walking, not elsewhere classified: Secondary | ICD-10-CM | POA: Diagnosis not present

## 2016-12-30 ENCOUNTER — Ambulatory Visit (INDEPENDENT_AMBULATORY_CARE_PROVIDER_SITE_OTHER): Payer: PPO | Admitting: Internal Medicine

## 2016-12-30 ENCOUNTER — Encounter: Payer: Self-pay | Admitting: Internal Medicine

## 2016-12-30 VITALS — BP 132/81 | HR 73 | Ht 64.0 in | Wt 264.0 lb

## 2016-12-30 DIAGNOSIS — I495 Sick sinus syndrome: Secondary | ICD-10-CM

## 2016-12-30 DIAGNOSIS — Z45018 Encounter for adjustment and management of other part of cardiac pacemaker: Secondary | ICD-10-CM

## 2016-12-30 NOTE — Progress Notes (Signed)
HPI [name] returns today for ongoing followup of sinus node dysfunction, s/p Biotronik PPM insertion. She is a pleasant 66 yo woman with symptomatic bradycardia who underwent PPM insertion about a year ago. 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 No chest pain or sob. She admits to being more sedentary. No syncope. Allergies  Allergen Reactions  . Imipramine Hives and Rash  . Tricor [Fenofibrate] Other (See Comments)    Pain, "aching"     Current Outpatient Prescriptions  Medication Sig Dispense Refill  . acetaminophen (TYLENOL 8 HOUR) 650 MG CR tablet Take 650 mg by mouth every morning.    [name] ALPRAZolam (XANAX XR) 1 MG 24 hr tablet Take 1 mg by mouth 2 (two) times daily.    [name] ALPRAZolam (XANAX) 1 MG tablet Take 0.5 mg by mouth as directed. Takes with xanax XR 1mg  one bid    . gemfibrozil (LOPID) 600 MG tablet Take 1 tablet (600 mg total) by mouth 2 (two) times daily. 60 tablet 2  . glucosamine-chondroitin 500-400 MG tablet Take 2 tablets by mouth daily.    [name] lamoTRIgine (LAMICTAL) 200 MG tablet Take 400 mg by mouth at bedtime.     . Multiple Vitamins-Minerals (OCUVITE PO) Take 1 tablet by mouth daily.    . QUEtiapine (SEROQUEL) 200 MG tablet Take 400 mg by mouth at bedtime.     . Vitamin D, Ergocalciferol, (DRISDOL) 50000 UNITS CAPS capsule Take 50,000 Units by mouth every 7 (seven) days.     No current facility-administered medications for this visit.      Past Medical History:  Diagnosis Date  . Anxiety   . Bipolar 1 disorder ([place])   . 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 Breast cancer, left breast ([place]) 07/22/2011  . Cancer [facility]) breast cancer 7 yrs ago  . Depression   . High cholesterol   . Hypertension   . IBS (irritable bowel syndrome)   . Impaired fasting glucose   . OA (osteoarthritis)   . Obesity   . Personal history of chemotherapy 2009  . Personal history of radiation therapy 2009  . Polycystic ovarian  disease   . 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 Sleep apnea     ROS:   All systems reviewed and negative except as noted in the HPI.   Past Surgical History:  Procedure Laterality Date  . ABDOMINAL HYSTERECTOMY    . APPENDECTOMY    . BREAST BIOPSY Left 2011  . BREAST BIOPSY  2008  . BREAST LUMPECTOMY Left 2008  . CHOLECYSTECTOMY    . COLONOSCOPY N/A 09/13/2013   Procedure: COLONOSCOPY;  Surgeon: [name], MD;  [facility];  Service: Endoscopy;  Laterality: N/A;  730  . EP IMPLANTABLE DEVICE N/A 01/06/2016   Procedure: Pacemaker Implant;  Surgeon: [name], MD;  Location: [place] CV LAB;  Service: Cardiovascular;  Laterality: N/A;  . ESOPHAGOGASTRODUODENOSCOPY N/A 01/17/2014   Procedure: ESOPHAGOGASTRODUODENOSCOPY (EGD);  Surgeon: [name], MD;  [facility];  Service: Endoscopy;  Laterality: N/A;  245  . TOTAL KNEE ARTHROPLASTY Bilateral right knee   2012, 2007     Family History  Problem Relation Age of Onset  . Bipolar disorder Mother   . Diabetes Mother   . Bipolar disorder Sister   . Diabetes Sister   . Alcohol abuse Father   . Hypertension Father   .  Heart disease Other      Social History   Social History  . Marital status: Married    Spouse name: N/A  . Number of children: N/A  . Years of education: N/A   Occupational History  . Not on file.   Social History Main Topics  . Smoking status: Never Smoker  . Smokeless tobacco: Never Used  . Alcohol use No  . Drug use: No  . Sexual activity: No   Other Topics Concern  . Not on file   Social History Narrative   04/24/2013 [name] was born and grew up in [place]. She reports that her childhood was "tough." She has 2 older sisters and a younger brother. She achieved an Associates Degree in nursing. 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 She has 2 daughters and one son. She denies any  legal difficulties. She affiliates as Engineer, manufacturing. Her hobbies include reading, sewing, crafts, and cooking. She reports that her social support system consists of her friend and passed her. 04/24/2013 [name]     BP 132/81   Pulse 73   Ht 5\' 4"  (1.626 m)   Wt 264 lb (119.7 kg)   SpO2 94%   BMI 45.32 kg/m   Physical Exam:  Well appearing NAD HEENT: Unremarkable Neck:  No JVD, no thyromegally Lymphatics:  No adenopathy Back:  No CVA tenderness Lungs:  Clear with no wheezes HEART:  Regular rate rhythm, no murmurs, no rubs, no clicks Abd:  soft, positive bowel sounds, no organomegally, no rebound, no guarding Ext:  2 plus pulses, no edema, no cyanosis, no clubbing Skin:  No rashes no nodules Neuro:  CN II through XII intact, motor grossly intact   DEVICE  Normal device function.  See PaceArt for details.   Assess/Plan: 1. Sinus node dysfunction - she is doing well, s/p PPM 2. PPM - her biotronik DDD PM is working normally.  3. Obesity - she was exercising and lost weight but has gotten back to gaining and eating poorly. She is encouraged to increase her activity.  4. HTN - her blood pressure is reasonably well controlled. I have asked her to reduce her salt consumption.  Candelaria Pies,M.D.

## 2016-12-30 NOTE — Patient Instructions (Signed)
Medication Instructions:    Your physician recommends that you continue on your current medications as directed. Please refer to the Current Medication list given to you today.  --- If you need a refill on your cardiac medications before your next appointment, please call your pharmacy. ---  Labwork:  None ordered  Testing/Procedures:  None ordered  Follow-Up: Remote monitoring is used to monitor your Pacemaker of ICD from home. This monitoring reduces the number of office visits required to check your device to one time per year. It allows us to keep an eye on the functioning of your device to ensure it is working properly. You are scheduled for a device check from home on 03/31/2017. You may send your transmission at any time that day. If you have a wireless device, the transmission will be sent automatically. After your physician reviews your transmission, you will receive a postcard with your next transmission date.   Your physician wants you to follow-up in: 1 year with Dr. Taylor.  You will receive a reminder letter in the mail two months in advance. If you don't receive a letter, please call our office to schedule the follow-up appointment.   Any Other Special Instructions Will Be Listed Below (If Applicable).   Thank you for choosing [facility]!!      

## 2016-12-31 DIAGNOSIS — M545 Low back pain: Secondary | ICD-10-CM | POA: Diagnosis not present

## 2016-12-31 DIAGNOSIS — M6249 Contracture of muscle, multiple sites: Secondary | ICD-10-CM | POA: Diagnosis not present

## 2016-12-31 DIAGNOSIS — M6259 Muscle wasting and atrophy, not elsewhere classified, multiple sites: Secondary | ICD-10-CM | POA: Diagnosis not present

## 2016-12-31 DIAGNOSIS — M5441 Lumbago with sciatica, right side: Secondary | ICD-10-CM | POA: Diagnosis not present

## 2016-12-31 DIAGNOSIS — R262 Difficulty in walking, not elsewhere classified: Secondary | ICD-10-CM | POA: Diagnosis not present

## 2016-12-31 DIAGNOSIS — M2560 Stiffness of unspecified joint, not elsewhere classified: Secondary | ICD-10-CM | POA: Diagnosis not present

## 2016-12-31 DIAGNOSIS — E669 Obesity, unspecified: Secondary | ICD-10-CM | POA: Diagnosis not present

## 2016-12-31 DIAGNOSIS — M6281 Muscle weakness (generalized): Secondary | ICD-10-CM | POA: Diagnosis not present

## 2016-12-31 DIAGNOSIS — X500XXS Overexertion from strenuous movement or load, sequela: Secondary | ICD-10-CM | POA: Diagnosis not present

## 2016-12-31 DIAGNOSIS — M5416 Radiculopathy, lumbar region: Secondary | ICD-10-CM | POA: Diagnosis not present

## 2016-12-31 DIAGNOSIS — F39 Unspecified mood [affective] disorder: Secondary | ICD-10-CM | POA: Diagnosis not present

## 2017-01-04 DIAGNOSIS — M5441 Lumbago with sciatica, right side: Secondary | ICD-10-CM | POA: Diagnosis not present

## 2017-01-04 DIAGNOSIS — M5416 Radiculopathy, lumbar region: Secondary | ICD-10-CM | POA: Diagnosis not present

## 2017-01-04 DIAGNOSIS — M545 Low back pain: Secondary | ICD-10-CM | POA: Diagnosis not present

## 2017-01-04 DIAGNOSIS — M6259 Muscle wasting and atrophy, not elsewhere classified, multiple sites: Secondary | ICD-10-CM | POA: Diagnosis not present

## 2017-01-04 DIAGNOSIS — E669 Obesity, unspecified: Secondary | ICD-10-CM | POA: Diagnosis not present

## 2017-01-04 DIAGNOSIS — M2560 Stiffness of unspecified joint, not elsewhere classified: Secondary | ICD-10-CM | POA: Diagnosis not present

## 2017-01-04 DIAGNOSIS — F39 Unspecified mood [affective] disorder: Secondary | ICD-10-CM | POA: Diagnosis not present

## 2017-01-04 DIAGNOSIS — R262 Difficulty in walking, not elsewhere classified: Secondary | ICD-10-CM | POA: Diagnosis not present

## 2017-01-04 DIAGNOSIS — M6249 Contracture of muscle, multiple sites: Secondary | ICD-10-CM | POA: Diagnosis not present

## 2017-01-04 DIAGNOSIS — X500XXS Overexertion from strenuous movement or load, sequela: Secondary | ICD-10-CM | POA: Diagnosis not present

## 2017-01-04 DIAGNOSIS — M6281 Muscle weakness (generalized): Secondary | ICD-10-CM | POA: Diagnosis not present

## 2017-01-05 ENCOUNTER — Ambulatory Visit (INDEPENDENT_AMBULATORY_CARE_PROVIDER_SITE_OTHER): Payer: PPO | Admitting: *Deleted

## 2017-01-05 DIAGNOSIS — I495 Sick sinus syndrome: Secondary | ICD-10-CM | POA: Diagnosis not present

## 2017-01-05 NOTE — Progress Notes (Signed)
Remote pacemaker transmission.   

## 2017-01-06 ENCOUNTER — Encounter: Payer: Self-pay | Admitting: Cardiology

## 2017-01-06 DIAGNOSIS — R262 Difficulty in walking, not elsewhere classified: Secondary | ICD-10-CM | POA: Diagnosis not present

## 2017-01-06 DIAGNOSIS — M6281 Muscle weakness (generalized): Secondary | ICD-10-CM | POA: Diagnosis not present

## 2017-01-06 DIAGNOSIS — M5441 Lumbago with sciatica, right side: Secondary | ICD-10-CM | POA: Diagnosis not present

## 2017-01-06 DIAGNOSIS — M2560 Stiffness of unspecified joint, not elsewhere classified: Secondary | ICD-10-CM | POA: Diagnosis not present

## 2017-01-06 DIAGNOSIS — M5416 Radiculopathy, lumbar region: Secondary | ICD-10-CM | POA: Diagnosis not present

## 2017-01-06 DIAGNOSIS — M545 Low back pain: Secondary | ICD-10-CM | POA: Diagnosis not present

## 2017-01-06 DIAGNOSIS — M6259 Muscle wasting and atrophy, not elsewhere classified, multiple sites: Secondary | ICD-10-CM | POA: Diagnosis not present

## 2017-01-06 DIAGNOSIS — X500XXS Overexertion from strenuous movement or load, sequela: Secondary | ICD-10-CM | POA: Diagnosis not present

## 2017-01-06 DIAGNOSIS — M6249 Contracture of muscle, multiple sites: Secondary | ICD-10-CM | POA: Diagnosis not present

## 2017-01-06 DIAGNOSIS — E669 Obesity, unspecified: Secondary | ICD-10-CM | POA: Diagnosis not present

## 2017-01-06 DIAGNOSIS — F39 Unspecified mood [affective] disorder: Secondary | ICD-10-CM | POA: Diagnosis not present

## 2017-01-11 DIAGNOSIS — X500XXS Overexertion from strenuous movement or load, sequela: Secondary | ICD-10-CM | POA: Diagnosis not present

## 2017-01-11 DIAGNOSIS — M6281 Muscle weakness (generalized): Secondary | ICD-10-CM | POA: Diagnosis not present

## 2017-01-11 DIAGNOSIS — M545 Low back pain: Secondary | ICD-10-CM | POA: Diagnosis not present

## 2017-01-11 DIAGNOSIS — E669 Obesity, unspecified: Secondary | ICD-10-CM | POA: Diagnosis not present

## 2017-01-11 DIAGNOSIS — M5441 Lumbago with sciatica, right side: Secondary | ICD-10-CM | POA: Diagnosis not present

## 2017-01-11 DIAGNOSIS — R262 Difficulty in walking, not elsewhere classified: Secondary | ICD-10-CM | POA: Diagnosis not present

## 2017-01-11 DIAGNOSIS — M5416 Radiculopathy, lumbar region: Secondary | ICD-10-CM | POA: Diagnosis not present

## 2017-01-11 DIAGNOSIS — F39 Unspecified mood [affective] disorder: Secondary | ICD-10-CM | POA: Diagnosis not present

## 2017-01-11 DIAGNOSIS — M6249 Contracture of muscle, multiple sites: Secondary | ICD-10-CM | POA: Diagnosis not present

## 2017-01-11 DIAGNOSIS — M6259 Muscle wasting and atrophy, not elsewhere classified, multiple sites: Secondary | ICD-10-CM | POA: Diagnosis not present

## 2017-01-11 DIAGNOSIS — M2560 Stiffness of unspecified joint, not elsewhere classified: Secondary | ICD-10-CM | POA: Diagnosis not present

## 2017-01-13 DIAGNOSIS — X500XXS Overexertion from strenuous movement or load, sequela: Secondary | ICD-10-CM | POA: Diagnosis not present

## 2017-01-13 DIAGNOSIS — F39 Unspecified mood [affective] disorder: Secondary | ICD-10-CM | POA: Diagnosis not present

## 2017-01-13 DIAGNOSIS — M6249 Contracture of muscle, multiple sites: Secondary | ICD-10-CM | POA: Diagnosis not present

## 2017-01-13 DIAGNOSIS — M2560 Stiffness of unspecified joint, not elsewhere classified: Secondary | ICD-10-CM | POA: Diagnosis not present

## 2017-01-13 DIAGNOSIS — M6259 Muscle wasting and atrophy, not elsewhere classified, multiple sites: Secondary | ICD-10-CM | POA: Diagnosis not present

## 2017-01-13 DIAGNOSIS — M6281 Muscle weakness (generalized): Secondary | ICD-10-CM | POA: Diagnosis not present

## 2017-01-13 DIAGNOSIS — M5441 Lumbago with sciatica, right side: Secondary | ICD-10-CM | POA: Diagnosis not present

## 2017-01-13 DIAGNOSIS — E669 Obesity, unspecified: Secondary | ICD-10-CM | POA: Diagnosis not present

## 2017-01-13 DIAGNOSIS — M545 Low back pain: Secondary | ICD-10-CM | POA: Diagnosis not present

## 2017-01-13 DIAGNOSIS — R262 Difficulty in walking, not elsewhere classified: Secondary | ICD-10-CM | POA: Diagnosis not present

## 2017-01-13 DIAGNOSIS — M5416 Radiculopathy, lumbar region: Secondary | ICD-10-CM | POA: Diagnosis not present

## 2017-01-17 ENCOUNTER — Other Ambulatory Visit: Payer: Self-pay | Admitting: Family Medicine

## 2017-01-17 DIAGNOSIS — I495 Sick sinus syndrome: Secondary | ICD-10-CM

## 2017-01-17 DIAGNOSIS — F3163 Bipolar disorder, current episode mixed, severe, without psychotic features: Secondary | ICD-10-CM | POA: Diagnosis not present

## 2017-01-18 DIAGNOSIS — E669 Obesity, unspecified: Secondary | ICD-10-CM | POA: Diagnosis not present

## 2017-01-18 DIAGNOSIS — M545 Low back pain: Secondary | ICD-10-CM | POA: Diagnosis not present

## 2017-01-18 DIAGNOSIS — M5441 Lumbago with sciatica, right side: Secondary | ICD-10-CM | POA: Diagnosis not present

## 2017-01-18 DIAGNOSIS — X500XXS Overexertion from strenuous movement or load, sequela: Secondary | ICD-10-CM | POA: Diagnosis not present

## 2017-01-18 DIAGNOSIS — M2560 Stiffness of unspecified joint, not elsewhere classified: Secondary | ICD-10-CM | POA: Diagnosis not present

## 2017-01-18 DIAGNOSIS — M6249 Contracture of muscle, multiple sites: Secondary | ICD-10-CM | POA: Diagnosis not present

## 2017-01-18 DIAGNOSIS — M6259 Muscle wasting and atrophy, not elsewhere classified, multiple sites: Secondary | ICD-10-CM | POA: Diagnosis not present

## 2017-01-18 DIAGNOSIS — F39 Unspecified mood [affective] disorder: Secondary | ICD-10-CM | POA: Diagnosis not present

## 2017-01-18 DIAGNOSIS — R262 Difficulty in walking, not elsewhere classified: Secondary | ICD-10-CM | POA: Diagnosis not present

## 2017-01-18 DIAGNOSIS — M6281 Muscle weakness (generalized): Secondary | ICD-10-CM | POA: Diagnosis not present

## 2017-01-18 DIAGNOSIS — M5416 Radiculopathy, lumbar region: Secondary | ICD-10-CM | POA: Diagnosis not present

## 2017-01-18 LAB — CUP PACEART INCLINIC DEVICE CHECK
Date Time Interrogation Session: 20180621132900
Implantable Lead Implant Date: 20170627
Implantable Lead Implant Date: 20170627
Implantable Lead Model: 377
Implantable Lead Serial Number: 49436227
Implantable Pulse Generator Implant Date: 20170627
Lead Channel Pacing Threshold Amplitude: 0.7 V
Lead Channel Pacing Threshold Amplitude: 0.7 V
Lead Channel Pacing Threshold Amplitude: 0.8 V
Lead Channel Pacing Threshold Amplitude: 0.8 V
Lead Channel Pacing Threshold Pulse Width: 0.4 ms
Lead Channel Pacing Threshold Pulse Width: 0.4 ms
Lead Channel Pacing Threshold Pulse Width: 0.4 ms
Lead Channel Pacing Threshold Pulse Width: 0.4 ms
Lead Channel Sensing Intrinsic Amplitude: 12.7 mV
Lead Channel Sensing Intrinsic Amplitude: 3.7 mV
Lead Channel Setting Pacing Amplitude: 2.4 V
MDC IDC LEAD LOCATION: 753859
MDC IDC LEAD LOCATION: 753860
MDC IDC LEAD SERIAL: 49471106
MDC IDC MSMT BATTERY REMAINING LONGEVITY: 109 mo
MDC IDC MSMT LEADCHNL RA IMPEDANCE VALUE: 448 Ohm
MDC IDC MSMT LEADCHNL RA SENSING INTR AMPL: 3.7 mV
MDC IDC MSMT LEADCHNL RV IMPEDANCE VALUE: 643 Ohm
MDC IDC MSMT LEADCHNL RV PACING THRESHOLD AMPLITUDE: 0.8 V
MDC IDC MSMT LEADCHNL RV PACING THRESHOLD PULSEWIDTH: 0.4 ms
MDC IDC MSMT LEADCHNL RV SENSING INTR AMPL: 12.6 mV
MDC IDC SET LEADCHNL RA PACING AMPLITUDE: 2 V
MDC IDC SET LEADCHNL RV PACING PULSEWIDTH: 0.4 ms
Pulse Gen Model: 394969
Pulse Gen Serial Number: 68786455

## 2017-01-20 DIAGNOSIS — F39 Unspecified mood [affective] disorder: Secondary | ICD-10-CM | POA: Diagnosis not present

## 2017-01-20 DIAGNOSIS — M5416 Radiculopathy, lumbar region: Secondary | ICD-10-CM | POA: Diagnosis not present

## 2017-01-20 DIAGNOSIS — M6281 Muscle weakness (generalized): Secondary | ICD-10-CM | POA: Diagnosis not present

## 2017-01-20 DIAGNOSIS — M6249 Contracture of muscle, multiple sites: Secondary | ICD-10-CM | POA: Diagnosis not present

## 2017-01-20 DIAGNOSIS — M545 Low back pain: Secondary | ICD-10-CM | POA: Diagnosis not present

## 2017-01-20 DIAGNOSIS — X500XXS Overexertion from strenuous movement or load, sequela: Secondary | ICD-10-CM | POA: Diagnosis not present

## 2017-01-20 DIAGNOSIS — M5441 Lumbago with sciatica, right side: Secondary | ICD-10-CM | POA: Diagnosis not present

## 2017-01-20 DIAGNOSIS — M2560 Stiffness of unspecified joint, not elsewhere classified: Secondary | ICD-10-CM | POA: Diagnosis not present

## 2017-01-20 DIAGNOSIS — E669 Obesity, unspecified: Secondary | ICD-10-CM | POA: Diagnosis not present

## 2017-01-20 DIAGNOSIS — M6259 Muscle wasting and atrophy, not elsewhere classified, multiple sites: Secondary | ICD-10-CM | POA: Diagnosis not present

## 2017-01-20 DIAGNOSIS — R262 Difficulty in walking, not elsewhere classified: Secondary | ICD-10-CM | POA: Diagnosis not present

## 2017-01-25 DIAGNOSIS — M545 Low back pain: Secondary | ICD-10-CM | POA: Diagnosis not present

## 2017-01-25 DIAGNOSIS — M5441 Lumbago with sciatica, right side: Secondary | ICD-10-CM | POA: Diagnosis not present

## 2017-01-25 DIAGNOSIS — M2560 Stiffness of unspecified joint, not elsewhere classified: Secondary | ICD-10-CM | POA: Diagnosis not present

## 2017-01-25 DIAGNOSIS — M5416 Radiculopathy, lumbar region: Secondary | ICD-10-CM | POA: Diagnosis not present

## 2017-01-27 DIAGNOSIS — M545 Low back pain: Secondary | ICD-10-CM | POA: Diagnosis not present

## 2017-01-27 DIAGNOSIS — M2560 Stiffness of unspecified joint, not elsewhere classified: Secondary | ICD-10-CM | POA: Diagnosis not present

## 2017-01-27 DIAGNOSIS — M5416 Radiculopathy, lumbar region: Secondary | ICD-10-CM | POA: Diagnosis not present

## 2017-01-27 DIAGNOSIS — M5441 Lumbago with sciatica, right side: Secondary | ICD-10-CM | POA: Diagnosis not present

## 2017-02-01 DIAGNOSIS — M2560 Stiffness of unspecified joint, not elsewhere classified: Secondary | ICD-10-CM | POA: Diagnosis not present

## 2017-02-01 DIAGNOSIS — M5441 Lumbago with sciatica, right side: Secondary | ICD-10-CM | POA: Diagnosis not present

## 2017-02-01 DIAGNOSIS — M5416 Radiculopathy, lumbar region: Secondary | ICD-10-CM | POA: Diagnosis not present

## 2017-02-01 DIAGNOSIS — M545 Low back pain: Secondary | ICD-10-CM | POA: Diagnosis not present

## 2017-02-01 LAB — CUP PACEART REMOTE DEVICE CHECK
Date Time Interrogation Session: 20180724084851
Implantable Lead Implant Date: 20170627
Implantable Lead Location: 753860
Implantable Lead Model: 377
Implantable Lead Serial Number: 49471106
MDC IDC LEAD IMPLANT DT: 20170627
MDC IDC LEAD LOCATION: 753859
MDC IDC LEAD SERIAL: 49436227
MDC IDC PG IMPLANT DT: 20170627
MDC IDC SET LEADCHNL RA PACING AMPLITUDE: 2 V
MDC IDC SET LEADCHNL RV PACING AMPLITUDE: 2.4 V
MDC IDC SET LEADCHNL RV PACING PULSEWIDTH: 0.4 ms
Pulse Gen Serial Number: 68786455

## 2017-02-07 DIAGNOSIS — M5416 Radiculopathy, lumbar region: Secondary | ICD-10-CM | POA: Diagnosis not present

## 2017-02-07 DIAGNOSIS — M2560 Stiffness of unspecified joint, not elsewhere classified: Secondary | ICD-10-CM | POA: Diagnosis not present

## 2017-02-07 DIAGNOSIS — M5441 Lumbago with sciatica, right side: Secondary | ICD-10-CM | POA: Diagnosis not present

## 2017-02-07 DIAGNOSIS — M545 Low back pain: Secondary | ICD-10-CM | POA: Diagnosis not present

## 2017-02-08 DIAGNOSIS — H2513 Age-related nuclear cataract, bilateral: Secondary | ICD-10-CM | POA: Diagnosis not present

## 2017-02-08 DIAGNOSIS — H524 Presbyopia: Secondary | ICD-10-CM | POA: Diagnosis not present

## 2017-02-08 DIAGNOSIS — H52203 Unspecified astigmatism, bilateral: Secondary | ICD-10-CM | POA: Diagnosis not present

## 2017-02-08 DIAGNOSIS — H5203 Hypermetropia, bilateral: Secondary | ICD-10-CM | POA: Diagnosis not present

## 2017-02-08 DIAGNOSIS — H35363 Drusen (degenerative) of macula, bilateral: Secondary | ICD-10-CM | POA: Diagnosis not present

## 2017-02-10 DIAGNOSIS — M545 Low back pain: Secondary | ICD-10-CM | POA: Diagnosis not present

## 2017-02-10 DIAGNOSIS — M5441 Lumbago with sciatica, right side: Secondary | ICD-10-CM | POA: Diagnosis not present

## 2017-02-10 DIAGNOSIS — M2560 Stiffness of unspecified joint, not elsewhere classified: Secondary | ICD-10-CM | POA: Diagnosis not present

## 2017-02-10 DIAGNOSIS — M5416 Radiculopathy, lumbar region: Secondary | ICD-10-CM | POA: Diagnosis not present

## 2017-02-21 ENCOUNTER — Encounter: Payer: Self-pay | Admitting: Family Medicine

## 2017-02-22 DIAGNOSIS — F3163 Bipolar disorder, current episode mixed, severe, without psychotic features: Secondary | ICD-10-CM | POA: Diagnosis not present

## 2017-03-01 DIAGNOSIS — F3163 Bipolar disorder, current episode mixed, severe, without psychotic features: Secondary | ICD-10-CM | POA: Diagnosis not present

## 2017-03-21 DIAGNOSIS — F3163 Bipolar disorder, current episode mixed, severe, without psychotic features: Secondary | ICD-10-CM | POA: Diagnosis not present

## 2017-04-06 ENCOUNTER — Ambulatory Visit (INDEPENDENT_AMBULATORY_CARE_PROVIDER_SITE_OTHER): Payer: PPO | Admitting: *Deleted

## 2017-04-06 DIAGNOSIS — I495 Sick sinus syndrome: Secondary | ICD-10-CM | POA: Diagnosis not present

## 2017-04-06 NOTE — Progress Notes (Signed)
Remote pacemaker transmission.   

## 2017-04-07 ENCOUNTER — Encounter: Payer: Self-pay | Admitting: Cardiology

## 2017-04-08 LAB — CUP PACEART REMOTE DEVICE CHECK
Implantable Lead Implant Date: 20170627
Implantable Lead Model: 377
Implantable Lead Serial Number: 49436227
MDC IDC LEAD IMPLANT DT: 20170627
MDC IDC LEAD LOCATION: 753859
MDC IDC LEAD LOCATION: 753860
MDC IDC LEAD SERIAL: 49471106
MDC IDC PG IMPLANT DT: 20170627
MDC IDC SESS DTM: 20180928153142
Pulse Gen Serial Number: 68786455

## 2017-04-12 ENCOUNTER — Other Ambulatory Visit: Payer: Self-pay | Admitting: Family Medicine

## 2017-04-12 DIAGNOSIS — I495 Sick sinus syndrome: Secondary | ICD-10-CM

## 2017-05-09 DIAGNOSIS — F3163 Bipolar disorder, current episode mixed, severe, without psychotic features: Secondary | ICD-10-CM | POA: Diagnosis not present

## 2017-05-30 DIAGNOSIS — F3163 Bipolar disorder, current episode mixed, severe, without psychotic features: Secondary | ICD-10-CM | POA: Diagnosis not present

## 2017-05-31 ENCOUNTER — Encounter: Payer: Self-pay | Admitting: Family Medicine

## 2017-05-31 DIAGNOSIS — F3176 Bipolar disorder, in full remission, most recent episode depressed: Secondary | ICD-10-CM | POA: Diagnosis not present

## 2017-05-31 DIAGNOSIS — F3174 Bipolar disorder, in full remission, most recent episode manic: Secondary | ICD-10-CM | POA: Diagnosis not present

## 2017-05-31 DIAGNOSIS — F410 Panic disorder [episodic paroxysmal anxiety] without agoraphobia: Secondary | ICD-10-CM | POA: Diagnosis not present

## 2017-05-31 MED ORDER — CHLORZOXAZONE 500 MG PO TABS: 500.0000 mg | ORAL_TABLET | Freq: Three times a day (TID) | ORAL | 0 refills | Status: DC | PRN

## 2017-05-31 NOTE — Addendum Note (Signed)
Addended by: Dairl Ponder on: 05/31/2017 04:36 PM   Modules accepted: Orders

## 2017-05-31 NOTE — Telephone Encounter (Signed)
May use Parafon forte 1 3 times daily as necessary, home use only, can cause drowsiness especially when a person is on nerve medication, #21-follow-up if ongoing troubles

## 2017-07-07 ENCOUNTER — Ambulatory Visit (INDEPENDENT_AMBULATORY_CARE_PROVIDER_SITE_OTHER): Payer: PPO | Admitting: *Deleted

## 2017-07-07 DIAGNOSIS — I495 Sick sinus syndrome: Secondary | ICD-10-CM | POA: Diagnosis not present

## 2017-07-07 NOTE — Progress Notes (Signed)
Remote pacemaker transmission.   

## 2017-07-08 ENCOUNTER — Encounter: Payer: Self-pay | Admitting: Cardiology

## 2017-07-09 ENCOUNTER — Encounter: Payer: Self-pay | Admitting: Internal Medicine

## 2017-07-21 ENCOUNTER — Other Ambulatory Visit: Payer: Self-pay | Admitting: Family Medicine

## 2017-07-21 DIAGNOSIS — I495 Sick sinus syndrome: Secondary | ICD-10-CM

## 2017-07-21 NOTE — Telephone Encounter (Signed)
Denied the medication patient needs to make an appointment

## 2017-07-22 LAB — CUP PACEART REMOTE DEVICE CHECK
Brady Statistic AP VP Percent: 0 %
Brady Statistic AS VS Percent: 63 %
Brady Statistic RA Percent Paced: 37 %
Brady Statistic RV Percent Paced: 0 %
Implantable Lead Implant Date: 20170627
Implantable Lead Location: 753859
Implantable Lead Model: 377
Implantable Lead Model: 377
Implantable Lead Serial Number: 49436227
Implantable Pulse Generator Implant Date: 20170627
Lead Channel Impedance Value: 431 Ohm
Lead Channel Pacing Threshold Amplitude: 0.8 V
Lead Channel Pacing Threshold Pulse Width: 0.4 ms
Lead Channel Setting Pacing Amplitude: 2 V
Lead Channel Setting Pacing Amplitude: 2.4 V
Lead Channel Setting Pacing Pulse Width: 0.4 ms
MDC IDC LEAD IMPLANT DT: 20170627
MDC IDC LEAD LOCATION: 753860
MDC IDC LEAD SERIAL: 49471106
MDC IDC MSMT LEADCHNL RA PACING THRESHOLD AMPLITUDE: 0.9 V
MDC IDC MSMT LEADCHNL RA PACING THRESHOLD PULSEWIDTH: 0.4 ms
MDC IDC MSMT LEADCHNL RV IMPEDANCE VALUE: 613 Ohm
MDC IDC PG SERIAL: 68786455
MDC IDC SESS DTM: 20190111062718
MDC IDC STAT BRADY AP VS PERCENT: 37 %
MDC IDC STAT BRADY AS VP PERCENT: 0 %

## 2017-08-10 DIAGNOSIS — F3163 Bipolar disorder, current episode mixed, severe, without psychotic features: Secondary | ICD-10-CM | POA: Diagnosis not present

## 2017-08-25 ENCOUNTER — Other Ambulatory Visit: Payer: Self-pay | Admitting: Family Medicine

## 2017-08-25 ENCOUNTER — Telehealth: Payer: Self-pay | Admitting: Family Medicine

## 2017-08-25 DIAGNOSIS — E781 Pure hyperglyceridemia: Secondary | ICD-10-CM

## 2017-08-25 DIAGNOSIS — Z1231 Encounter for screening mammogram for malignant neoplasm of breast: Secondary | ICD-10-CM

## 2017-08-25 DIAGNOSIS — Z79899 Other long term (current) drug therapy: Secondary | ICD-10-CM

## 2017-08-25 DIAGNOSIS — I10 Essential (primary) hypertension: Secondary | ICD-10-CM

## 2017-08-25 NOTE — Telephone Encounter (Signed)
Pt called to request lab orders to be sent over for a physical that is scheduled with Dr. Nicki Reaper on Sep 05, 2017. Last labs per Epic were: a1c and lipid on 02/06/2016.

## 2017-08-26 NOTE — Telephone Encounter (Signed)
Patient notified and verbalized understanding. 

## 2017-08-26 NOTE — Telephone Encounter (Signed)
Left message to return call (blood work ordered in Epic) 

## 2017-08-26 NOTE — Telephone Encounter (Signed)
Lipid, liver, metabolic 7, CBC

## 2017-08-31 DIAGNOSIS — E781 Pure hyperglyceridemia: Secondary | ICD-10-CM | POA: Diagnosis not present

## 2017-08-31 DIAGNOSIS — Z79899 Other long term (current) drug therapy: Secondary | ICD-10-CM | POA: Diagnosis not present

## 2017-08-31 DIAGNOSIS — I10 Essential (primary) hypertension: Secondary | ICD-10-CM | POA: Diagnosis not present

## 2017-09-01 LAB — HEPATIC FUNCTION PANEL
ALK PHOS: 112 IU/L (ref 39–117)
ALT: 19 IU/L (ref 0–32)
AST: 18 IU/L (ref 0–40)
Albumin: 4.5 g/dL (ref 3.6–4.8)
BILIRUBIN TOTAL: 0.4 mg/dL (ref 0.0–1.2)
BILIRUBIN, DIRECT: 0.11 mg/dL (ref 0.00–0.40)
Total Protein: 7 g/dL (ref 6.0–8.5)

## 2017-09-01 LAB — CBC WITH DIFFERENTIAL/PLATELET
Basophils Absolute: 0 10*3/uL (ref 0.0–0.2)
Basos: 0 %
EOS (ABSOLUTE): 0 10*3/uL (ref 0.0–0.4)
Eos: 0 %
Hematocrit: 40.7 % (ref 34.0–46.6)
Hemoglobin: 13.3 g/dL (ref 11.1–15.9)
IMMATURE GRANULOCYTES: 0 %
Immature Grans (Abs): 0 10*3/uL (ref 0.0–0.1)
LYMPHS ABS: 1.7 10*3/uL (ref 0.7–3.1)
Lymphs: 32 %
MCH: 30.6 pg (ref 26.6–33.0)
MCHC: 32.7 g/dL (ref 31.5–35.7)
MCV: 94 fL (ref 79–97)
MONOS ABS: 0.4 10*3/uL (ref 0.1–0.9)
Monocytes: 8 %
NEUTROS PCT: 60 %
Neutrophils Absolute: 3 10*3/uL (ref 1.4–7.0)
PLATELETS: 280 10*3/uL (ref 150–379)
RBC: 4.34 x10E6/uL (ref 3.77–5.28)
RDW: 14 % (ref 12.3–15.4)
WBC: 5.1 10*3/uL (ref 3.4–10.8)

## 2017-09-01 LAB — LIPID PANEL
Chol/HDL Ratio: 3.6 ratio (ref 0.0–4.4)
Cholesterol, Total: 153 mg/dL (ref 100–199)
HDL: 42 mg/dL (ref >39)
LDL Calculated: 77 mg/dL (ref 0–99)
TRIGLYCERIDES: 170 mg/dL — AB (ref 0–149)
VLDL Cholesterol Cal: 34 mg/dL (ref 5–40)

## 2017-09-01 LAB — BASIC METABOLIC PANEL
BUN/Creatinine Ratio: 23 (ref 12–28)
BUN: 17 mg/dL (ref 8–27)
CO2: 24 mmol/L (ref 20–29)
Calcium: 9.7 mg/dL (ref 8.7–10.3)
Chloride: 103 mmol/L (ref 96–106)
Creatinine, Ser: 0.73 mg/dL (ref 0.57–1.00)
GFR calc Af Amer: 101 mL/min/{1.73_m2} (ref >59)
GFR calc non Af Amer: 87 mL/min/{1.73_m2} (ref >59)
GLUCOSE: 118 mg/dL — AB (ref 65–99)
POTASSIUM: 4.5 mmol/L (ref 3.5–5.2)
SODIUM: 142 mmol/L (ref 134–144)

## 2017-09-05 ENCOUNTER — Ambulatory Visit (INDEPENDENT_AMBULATORY_CARE_PROVIDER_SITE_OTHER): Payer: PPO | Admitting: Family Medicine

## 2017-09-05 ENCOUNTER — Encounter: Payer: Self-pay | Admitting: Family Medicine

## 2017-09-05 VITALS — BP 126/84 | Ht 64.0 in | Wt 268.0 lb

## 2017-09-05 DIAGNOSIS — R5383 Other fatigue: Secondary | ICD-10-CM | POA: Diagnosis not present

## 2017-09-05 DIAGNOSIS — Z0000 Encounter for general adult medical examination without abnormal findings: Secondary | ICD-10-CM | POA: Diagnosis not present

## 2017-09-05 DIAGNOSIS — Z1159 Encounter for screening for other viral diseases: Secondary | ICD-10-CM | POA: Diagnosis not present

## 2017-09-05 DIAGNOSIS — R7303 Prediabetes: Secondary | ICD-10-CM

## 2017-09-05 DIAGNOSIS — Z114 Encounter for screening for human immunodeficiency virus [HIV]: Secondary | ICD-10-CM | POA: Diagnosis not present

## 2017-09-05 DIAGNOSIS — Z0001 Encounter for general adult medical examination with abnormal findings: Secondary | ICD-10-CM | POA: Diagnosis not present

## 2017-09-05 LAB — POCT GLYCOSYLATED HEMOGLOBIN (HGB A1C): HEMOGLOBIN A1C: 4.9

## 2017-09-05 MED ORDER — ZOSTER VAC RECOMB ADJUVANTED 50 MCG/0.5ML IM SUSR: 0.5000 mL | Freq: Once | INTRAMUSCULAR | 0 refills | Status: AC

## 2017-09-05 NOTE — Progress Notes (Signed)
Subjective:    Patient ID: [name], female    DOB: [birth year], 67 y.o.   MRN: [id number]  HPI The patient comes in today for a wellness visit.    A review of their health history was completed.  A review of medications was also completed.  Any needed refills; lopid  Eating habits: sometimes health conscious  Falls/  MVA accidents in past few months: none  Regular exercise: bicycle at the [facility]  Specialist pt sees on regular basis: Dr. Lovena [name] for pacemaker and Dr. Toy [name] - psychiatrist  Preventative health issues were discussed.   Additional concerns: none. Declines pelvic exam and breast exam. mammo already schedule.  Morbid obesity patient was counseled to lose weight watch diet stay physically active   Review of Systems  Constitutional: Negative for activity change, appetite change and fatigue.  HENT: Negative for congestion and rhinorrhea.   Eyes: Negative for discharge.  Respiratory: Negative for cough, chest tightness and wheezing.   Cardiovascular: Negative for chest pain.  Gastrointestinal: Negative for abdominal pain, blood in stool and vomiting.  Endocrine: Negative for polyphagia.  Genitourinary: Negative for difficulty urinating and frequency.  Musculoskeletal: Negative for neck pain.  Skin: Negative for color change.  Allergic/Immunologic: Negative for environmental allergies and food allergies.  Neurological: Negative for weakness and headaches.  Psychiatric/Behavioral: Negative for agitation and behavioral problems.       Objective:   Physical Exam  Constitutional: She is oriented to person, place, and time. She appears well-developed and well-nourished.  HENT:  Head: Normocephalic and atraumatic.  Right Ear: External ear normal.  Left Ear: External ear normal.  Eyes: Right eye exhibits no discharge. Left eye exhibits no discharge.  Neck: Normal range of motion. No tracheal deviation present.  Cardiovascular: Normal rate, regular rhythm,  normal heart sounds and intact distal pulses. Exam reveals no gallop.  No murmur heard. Pulmonary/Chest: Effort normal and breath sounds normal. No stridor. No respiratory distress. She has no wheezes. She has no rales.  Abdominal: Soft. Bowel sounds are normal. She exhibits no distension and no mass. There is no tenderness. There is no rebound and no guarding.  Musculoskeletal: Normal range of motion. She exhibits no edema or tenderness.  Lymphadenopathy:    She has no cervical adenopathy.  Neurological: She is alert and oriented to person, place, and time. She exhibits normal muscle tone.  Skin: Skin is warm and dry.  Psychiatric: She has a normal mood and affect. Her behavior is normal.   Patient has cold intolerance fatigue tiredness possibly related to thyroid possibly its not related at all therefore we will go ahead and check thyroid function  Fasting glucose elevated risk of developing diabetes A1c was checked look good patient has impaired fasting glucose which essentially is prediabetes watch diet stay active lose weight  Patient does not want female health physical she will get this through gynecologist or [name] at least every 3 years    Assessment & Plan:  1. Routine general medical examination at a health [name] facility Adult wellness-complete.wellness physical was conducted today. Importance of diet and exercise were discussed in detail. In addition to this a discussion regarding safety was also covered. We also reviewed over immunizations and gave recommendations regarding current immunization needed for age. In addition to this additional areas were also touched on including: Preventative health exams needed: Colonoscopy patient good on colonoscopy currently she states it was not due until 7 years which would be 2022  Patient was advised yearly wellness  exam  - TSH - Hepatitis C antibody - HIV antibody  2. Prediabetes She understands importance of watching diet  minimizing starches and losing weight - POCT glycosylated hemoglobin (Hb A1C)  3. Encounter for hepatitis C screening test for low risk patient Screening ordered - Hepatitis C antibody  4. Encounter for screening for HIV Screening ordered - HIV antibody  5. Other fatigue Cold intolerance with fatigue check thyroid function await results - TSH  6. Morbid obesity ([place]) Patient counseled to cut portions watch diet stay active lose weight

## 2017-09-05 NOTE — Patient Instructions (Signed)
Diabetes Mellitus and Nutrition When you have diabetes (diabetes mellitus), it is very important to have healthy eating habits because your blood sugar (glucose) levels are greatly affected by what you eat and drink. Eating healthy foods in the appropriate amounts, at about the same times every day, can help you:  Control your blood glucose.  Lower your risk of heart disease.  Improve your blood pressure.  Reach or maintain a healthy weight.  Every person with diabetes is different, and each person has different needs for a meal plan. Your health care provider may recommend that you work with a diet and nutrition specialist (dietitian) to make a meal plan that is best for you. Your meal plan may vary depending on factors such as:  The calories you need.  The medicines you take.  Your weight.  Your blood glucose, blood pressure, and cholesterol levels.  Your activity level.  Other health conditions you have, such as heart or kidney disease.  How do carbohydrates affect me? Carbohydrates affect your blood glucose level more than any other type of food. Eating carbohydrates naturally increases the amount of glucose in your blood. Carbohydrate counting is a method for keeping track of how many carbohydrates you eat. Counting carbohydrates is important to keep your blood glucose at a healthy level, especially if you use insulin or take certain oral diabetes medicines. It is important to know how many carbohydrates you can safely have in each meal. This is different for every person. Your dietitian can help you calculate how many carbohydrates you should have at each meal and for snack. Foods that contain carbohydrates include:  Bread, cereal, rice, pasta, and crackers.  Potatoes and corn.  Peas, beans, and lentils.  Milk and yogurt.  Fruit and juice.  Desserts, such as cakes, cookies, ice cream, and candy.  How does alcohol affect me? Alcohol can cause a sudden decrease in blood  glucose (hypoglycemia), especially if you use insulin or take certain oral diabetes medicines. Hypoglycemia can be a life-threatening condition. Symptoms of hypoglycemia (sleepiness, dizziness, and confusion) are similar to symptoms of having too much alcohol. If your health care provider says that alcohol is safe for you, follow these guidelines:  Limit alcohol intake to no more than 1 drink per day for nonpregnant women and 2 drinks per day for men. One drink equals 12 oz of beer, 5 oz of wine, or 1 oz of hard liquor.  Do not drink on an empty stomach.  Keep yourself hydrated with water, diet soda, or unsweetened iced tea.  Keep in mind that regular soda, juice, and other mixers may contain a lot of sugar and must be counted as carbohydrates.  What are tips for following this plan? Reading food labels  Start by checking the serving size on the label. The amount of calories, carbohydrates, fats, and other nutrients listed on the label are based on one serving of the food. Many foods contain more than one serving per package.  Check the total grams (g) of carbohydrates in one serving. You can calculate the number of servings of carbohydrates in one serving by dividing the total carbohydrates by 15. For example, if a food has 30 g of total carbohydrates, it would be equal to 2 servings of carbohydrates.  Check the number of grams (g) of saturated and trans fats in one serving. Choose foods that have low or no amount of these fats.  Check the number of milligrams (mg) of sodium in one serving. Most people   should limit total sodium intake to less than 2,300 mg per day.  Always check the nutrition information of foods labeled as "low-fat" or "nonfat". These foods may be higher in added sugar or refined carbohydrates and should be avoided.  Talk to your dietitian to identify your daily goals for nutrients listed on the label. Shopping  Avoid buying canned, premade, or processed foods. These  foods tend to be high in fat, sodium, and added sugar.  Shop around the outside edge of the grocery store. This includes fresh fruits and vegetables, bulk grains, fresh meats, and fresh dairy. Cooking  Use low-heat cooking methods, such as baking, instead of high-heat cooking methods like deep frying.  Cook using healthy oils, such as olive, canola, or sunflower oil.  Avoid cooking with butter, cream, or high-fat meats. Meal planning  Eat meals and snacks regularly, preferably at the same times every day. Avoid going long periods of time without eating.  Eat foods high in fiber, such as fresh fruits, vegetables, beans, and whole grains. Talk to your dietitian about how many servings of carbohydrates you can eat at each meal.  Eat 4-6 ounces of lean protein each day, such as lean meat, chicken, fish, eggs, or tofu. 1 ounce is equal to 1 ounce of meat, chicken, or fish, 1 egg, or 1/4 cup of tofu.  Eat some foods each day that contain healthy fats, such as avocado, nuts, seeds, and fish. Lifestyle   Check your blood glucose regularly.  Exercise at least 30 minutes 5 or more days each week, or as told by your health care provider.  Take medicines as told by your health care provider.  Do not use any products that contain nicotine or tobacco, such as cigarettes and e-cigarettes. If you need help quitting, ask your health care provider.  Work with a counselor or diabetes educator to identify strategies to manage stress and any emotional and social challenges. What are some questions to ask my health care provider?  Do I need to meet with a diabetes educator?  Do I need to meet with a dietitian?  What number can I call if I have questions?  When are the best times to check my blood glucose? Where to find more information:  American Diabetes Association: diabetes.org/food-and-fitness/food  Academy of Nutrition and Dietetics:  www.eatright.org/resources/health/diseases-and-conditions/diabetes  National Institute of Diabetes and Digestive and Kidney Diseases (NIH): www.niddk.nih.gov/health-information/diabetes/overview/diet-eating-physical-activity Summary  A healthy meal plan will help you control your blood glucose and maintain a healthy lifestyle.  Working with a diet and nutrition specialist (dietitian) can help you make a meal plan that is best for you.  Keep in mind that carbohydrates and alcohol have immediate effects on your blood glucose levels. It is important to count carbohydrates and to use alcohol carefully. This information is not intended to replace advice given to you by your health care provider. Make sure you discuss any questions you have with your health care provider. Document Released: 03/25/2005 Document Revised: 08/02/2016 Document Reviewed: 08/02/2016 Elsevier Interactive Patient Education  2018 Elsevier Inc.  

## 2017-09-06 LAB — HEPATITIS C ANTIBODY: Hep C Virus Ab: 0.1 s/co ratio (ref 0.0–0.9)

## 2017-09-06 LAB — TSH: TSH: 1.9 u[IU]/mL (ref 0.450–4.500)

## 2017-09-06 LAB — HIV ANTIBODY (ROUTINE TESTING W REFLEX): HIV SCREEN 4TH GENERATION: NONREACTIVE

## 2017-09-28 ENCOUNTER — Ambulatory Visit
Admission: RE | Admit: 2017-09-28 | Discharge: 2017-09-28 | Disposition: A | Discharge status: Home / Self Care | Payer: PPO | Source: Ambulatory Visit | Attending: Family Medicine | Admitting: Family Medicine

## 2017-09-28 DIAGNOSIS — Z1231 Encounter for screening mammogram for malignant neoplasm of breast: Secondary | ICD-10-CM

## 2017-10-06 ENCOUNTER — Ambulatory Visit (INDEPENDENT_AMBULATORY_CARE_PROVIDER_SITE_OTHER): Payer: PPO | Admitting: *Deleted

## 2017-10-06 DIAGNOSIS — I495 Sick sinus syndrome: Secondary | ICD-10-CM

## 2017-10-07 NOTE — Progress Notes (Signed)
Remote pacemaker transmission.   

## 2017-10-10 ENCOUNTER — Emergency Department (HOSPITAL_COMMUNITY)
Admission: EM | Admit: 2017-10-10 | Discharge: 2017-10-10 | Disposition: A | Discharge status: Home / Self Care | Payer: PPO | Attending: Emergency Medicine | Admitting: Emergency Medicine

## 2017-10-10 ENCOUNTER — Encounter: Payer: Self-pay | Admitting: Internal Medicine

## 2017-10-10 ENCOUNTER — Encounter: Payer: Self-pay | Admitting: Cardiology

## 2017-10-10 DIAGNOSIS — S0181XA Laceration without foreign body of other part of head, initial encounter: Secondary | ICD-10-CM | POA: Insufficient documentation

## 2017-10-10 DIAGNOSIS — Y939 Activity, unspecified: Secondary | ICD-10-CM | POA: Insufficient documentation

## 2017-10-10 DIAGNOSIS — Z96653 Presence of artificial knee joint, bilateral: Secondary | ICD-10-CM | POA: Diagnosis not present

## 2017-10-10 DIAGNOSIS — Y929 Unspecified place or not applicable: Secondary | ICD-10-CM | POA: Diagnosis not present

## 2017-10-10 DIAGNOSIS — W01190A Fall on same level from slipping, tripping and stumbling with subsequent striking against furniture, initial encounter: Secondary | ICD-10-CM | POA: Diagnosis not present

## 2017-10-10 DIAGNOSIS — Y999 Unspecified external cause status: Secondary | ICD-10-CM | POA: Insufficient documentation

## 2017-10-10 DIAGNOSIS — I10 Essential (primary) hypertension: Secondary | ICD-10-CM | POA: Insufficient documentation

## 2017-10-10 DIAGNOSIS — Z23 Encounter for immunization: Secondary | ICD-10-CM | POA: Insufficient documentation

## 2017-10-10 DIAGNOSIS — Z79899 Other long term (current) drug therapy: Secondary | ICD-10-CM | POA: Diagnosis not present

## 2017-10-10 MED ORDER — LIDOCAINE-EPINEPHRINE (PF) 1 %-1:200000 IJ SOLN
INTRAMUSCULAR | Status: AC
  Filled 2017-10-10: qty 30

## 2017-10-10 MED ORDER — TETANUS-DIPHTH-ACELL PERTUSSIS 5-2.5-18.5 LF-MCG/0.5 IM SUSP
0.5000 mL | Freq: Once | INTRAMUSCULAR | Status: AC
  Administered 2017-10-10: 0.5 mL via INTRAMUSCULAR
  Filled 2017-10-10: qty 0.5

## 2017-10-10 MED ORDER — LIDOCAINE-EPINEPHRINE (PF) 1 %-1:200000 IJ SOLN: 10.0000 mL | Freq: Once | INTRAMUSCULAR | Status: DC

## 2017-10-10 NOTE — ED Provider Notes (Signed)
[facility] EMERGENCY DEPARTMENT Provider Note   CSN: [id number] Arrival date & time: 10/10/17  5188     History   Chief Complaint Chief Complaint  Patient presents with  . Laceration    HPI [name] is a 67 y.o. female.  HPI   63yF with forehead laceration. [place] this morning and struck her head against night stand. No LOC. Denies significant pain aside from site of laceration. No blood thinners. No visual changes, numbness/tingling, loss of strength. Unsure of last tetanus.   Past Medical History:  Diagnosis Date  . Anxiety   . Bipolar 1 disorder ([place])   . 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 Breast cancer, left breast ([place]) 07/22/2011  . Cancer [facility]) breast cancer 7 yrs ago  . Depression   . High cholesterol   . Hypertension   . IBS (irritable bowel syndrome)   . Impaired fasting glucose   . OA (osteoarthritis)   . Obesity   . Personal history of chemotherapy 2009  . Personal history of radiation therapy 2009  . Polycystic ovarian disease   . 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 Sleep apnea     Patient Active Problem List   Diagnosis Date Noted  . Morbid obesity ([place]) 09/05/2017  . Sinus node dysfunction ([place]) 01/06/2016  . Bradycardia 10/22/2015  . Bipolar disorder ([place]) 10/22/2015  . Anxiety 10/22/2015  . Pre-syncope 10/22/2015  . Episodic lightheadedness   . GERD (gastroesophageal reflux disease) 01/08/2014  . Hypertension 11/28/2013  . Palpitations 11/28/2013  . Osteoarthritis 11/08/2013  . Lumbar back pain 11/08/2013  . Hypertriglyceridemia 08/06/2013  . Depression 04/20/2013  . Breast cancer, left breast ([place]) 07/22/2011    Past Surgical History:  Procedure Laterality Date  . ABDOMINAL HYSTERECTOMY    . APPENDECTOMY    . BREAST BIOPSY Left 2011  . BREAST BIOPSY  2008  . BREAST LUMPECTOMY Left 2008  . CHOLECYSTECTOMY    . COLONOSCOPY N/A 09/13/2013   Procedure: COLONOSCOPY;   Surgeon: [name], MD;  [facility];  Service: Endoscopy;  Laterality: N/A;  730  . EP IMPLANTABLE DEVICE N/A 01/06/2016   Procedure: Pacemaker Implant;  Surgeon: [name], MD;  Location: [place] CV LAB;  Service: Cardiovascular;  Laterality: N/A;  . ESOPHAGOGASTRODUODENOSCOPY N/A 01/17/2014   Procedure: ESOPHAGOGASTRODUODENOSCOPY (EGD);  Surgeon: [name], MD;  Location: [facility] ENDO SU[facility];  Service: Endoscopy;  Laterality: N/A;  245  . TOTAL KNEE ARTHROPLASTY Bilateral right knee   2012, 2007     OB History   None      Home Medications    Prior to Admission medications   Medication Sig Start Date End Date Taking? Authorizing Provider  acetaminophen (TYLENOL 8 HOUR) 650 MG CR tablet Take 650 mg by mouth every morning.    [provider]  ALPRAZolam (XANAX XR) 1 MG 24 hr tablet Take 1 mg by mouth 2 (two) times daily.    [provider]  ALPRAZolam [name]) 1 MG tablet Take 0.5 mg by mouth as directed. Takes with xanax XR 1mg  one bid    [provider]  glucosamine-chondroitin 500-400 MG tablet Take 2 tablets by mouth daily.    [provider]  lamoTRIgine (LAMICTAL) 200 MG tablet Take 400 mg by mouth at bedtime.     [provider]  Multiple Vitamins-Minerals (OCUVITE PO) Take 1 tablet by mouth daily.    [provider]  QUEtiapine (SEROQUEL) 200 MG tablet Take 400 mg by mouth at bedtime.  05/07/13   [name], MD    Family History Family History  Problem Relation Age of Onset  . Bipolar disorder Mother   . Diabetes Mother   . Bipolar disorder Sister   . Diabetes Sister   . Alcohol abuse Father   . Hypertension Father   . Heart disease Other     Social History Social History   Tobacco Use  . Smoking status: Never Smoker  . Smokeless tobacco: Never Used  Substance Use Topics  . Alcohol use: No    Alcohol/week: 0.0 oz  . Drug use: No     Allergies   Imipramine and Tricor  [fenofibrate]   Review of Systems Review of Systems  All systems reviewed and negative, other than as noted in HPI.  Physical Exam Updated Vital Signs BP (!) 143/67 (BP Location: Right Arm)   Pulse 91   Temp (!) 97.5 F (36.4 C) (Oral)   Resp 18   Ht 5\' 4"  (1.626 m)   Wt 122.5 kg (270 lb)   SpO2 97%   BMI 46.35 kg/m   Physical Exam  Constitutional: She appears well-developed and well-nourished. No distress.  HENT:  Head: Normocephalic.    3 cm linear laceration to the right forehead.  Hemostatic.  Eyes: Conjunctivae are normal. Right eye exhibits no discharge. Left eye exhibits no discharge.  Neck: Neck supple.  Cardiovascular: Normal rate, regular rhythm and normal heart sounds. Exam reveals no gallop and no friction rub.  No murmur heard. Pulmonary/Chest: Effort normal and breath sounds normal. No respiratory distress.  Abdominal: Soft. She exhibits no distension. There is no tenderness.  Musculoskeletal: She exhibits no edema or tenderness.  Neurological: She is alert.  Skin: Skin is warm and dry.  Psychiatric: She has a normal mood and affect. Her behavior is normal. Thought content normal.  Nursing note and vitals reviewed.    ED Treatments / Results  Labs (all labs ordered are listed, but only abnormal results are displayed) Labs Reviewed - No data to display  EKG None  Radiology No results found.  Procedures Procedures (including critical care time)  LACERATION REPAIR Performed by: Virgel Manifold Authorized by: Virgel Manifold Consent: Verbal consent obtained. Risks and benefits: risks, benefits and alternatives were discussed Consent given by: patient Patient identity confirmed: provided demographic data Prepped and Draped in normal sterile fashion Wound explored  Laceration Location: forehead Laceration Length: 3 cm  No Foreign Bodies seen or palpated  Anesthesia: local infiltration  Local anesthetic: lidocaine 1% w/  epinephrine  Anesthetic total: 2 ml  Irrigation method: syringe Amount of cleaning: standard  Skin closure: 6-0 plain gut Number of sutures: 5  Technique: simple interupted  Patient tolerance: Patient tolerated the procedure well with no immediate complications.   Medications Ordered in ED Medications  lidocaine-EPINEPHrine (XYLOCAINE-EPINEPHrine) 1 %-1:200000 (PF) injection 10 mL (has no administration in time range)  lidocaine-EPINEPHrine (XYLOCAINE-EPINEPHrine) 1 %-1:200000 (PF) injection (has no administration in time range)     Initial Impression / Assessment and Plan / ED Course  I have reviewed the triage vital signs and the nursing notes.  Pertinent labs & imaging results that were available during my care of the patient were reviewed by me and considered in my medical decision making (see chart for details).     67 year old female with laceration of the right forehead.  No concerning factors with regards to her head injury.  Laceration  was closed.  Continued wound care.  Final Clinical Impressions(s) / ED Diagnoses   Final diagnoses:  Laceration of forehead, initial encounter    ED Discharge Orders    None       Virgel Manifold, MD 10/10/17 [phone]

## 2017-10-10 NOTE — ED Triage Notes (Signed)
Pt uses a step to get into bed and she tripped and fell hitting her Forehead on R side. Has approximately 1 inch laceration.

## 2017-10-11 NOTE — Telephone Encounter (Signed)
LVM for pt to call device clinic back. 

## 2017-10-27 ENCOUNTER — Ambulatory Visit: Payer: PPO

## 2017-10-27 LAB — CUP PACEART REMOTE DEVICE CHECK
Date Time Interrogation Session: 20190418131639
Implantable Lead Implant Date: 20170627
Implantable Lead Implant Date: 20170627
Implantable Lead Location: 753859
Implantable Lead Location: 753860
Implantable Lead Model: 377
Implantable Lead Model: 377
Implantable Lead Serial Number: 49436227
Implantable Lead Serial Number: 49471106
Implantable Pulse Generator Implant Date: 20170627
Pulse Gen Model: 394969
Pulse Gen Serial Number: 68786455

## 2017-11-22 DIAGNOSIS — F3174 Bipolar disorder, in full remission, most recent episode manic: Secondary | ICD-10-CM | POA: Diagnosis not present

## 2017-11-22 DIAGNOSIS — F410 Panic disorder [episodic paroxysmal anxiety] without agoraphobia: Secondary | ICD-10-CM | POA: Diagnosis not present

## 2017-11-22 DIAGNOSIS — F3176 Bipolar disorder, in full remission, most recent episode depressed: Secondary | ICD-10-CM | POA: Diagnosis not present

## 2017-12-09 ENCOUNTER — Encounter: Payer: Self-pay | Admitting: *Deleted

## 2017-12-12 ENCOUNTER — Telehealth: Payer: Self-pay | Admitting: Neurology

## 2017-12-12 ENCOUNTER — Ambulatory Visit (INDEPENDENT_AMBULATORY_CARE_PROVIDER_SITE_OTHER): Payer: PPO | Admitting: Neurology

## 2017-12-12 ENCOUNTER — Encounter: Payer: Self-pay | Admitting: Neurology

## 2017-12-12 DIAGNOSIS — R413 Other amnesia: Secondary | ICD-10-CM | POA: Insufficient documentation

## 2017-12-12 NOTE — Progress Notes (Signed)
PATIENT: [name] DOB: 1951/04/17  Chief Complaint  Patient presents with  . Memory Loss    MMSE 29/30 - 10 animals.  She has noticed an increase in memory loss over the last 1.5 years.  She is under the care of Dr. [name] for her bipolar disorder.  [name] PCP    Kathyrn Drown, MD  . Psychiatrist    Chucky May, MD - referring provider     HISTORICAL  [name] is a 67 years old female, seen in refer by her psychiatrist [name], [place] for evaluation of memory loss, her primary care physician is Dr. Wolfgang Phoenix, Elayne Snare, initial evaluation was on December 12, 2017.  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 Starleen Arms note  on Nov 22, 2017, she is taking Seroquel 200 mg 2 tablets every night, lamotrigine 200 mg every night, Xanax xr 45m qday, xanax 197mprn.  Her 2 elderly sisters suffer dementia, she complains of mild depression, despite polypharmacy treatment, her husband suffered schizoaffective disorder, visiting her group home, she used to work as an RNTherapist, sportsis on disability since 2016 due to flareup of her mood disorder,  Since 2016, she noticed mild memory loss, difficulty concentrating, even recently, with better control of her mood disorder, she still has trouble recall where she met her friend, got lost even using GPS, she works part-time as RNTherapist, sportshas difficulty keeping track of her working hours.   Laboratory evaluations in 2019; normal or negative HIV, hepatitis C, TSH, A1c 5.9, normal CBC, BMP showed mild elevated glucose 118, normal liver functional test, lipid profile showed mild elevated triglyceride 170,  REVIEW OF SYSTEMS: Full 14 system review of systems performed and notable only for memory loss, depression, anxiety disinterested in activities, joint pain  ALLERGIES: Allergies  Allergen Reactions    . Imipramine Hives and Rash  . Tricor [Fenofibrate] Other (See Comments)    Pain, "aching"    HOME MEDICATIONS: Current Outpatient Medications  Medication Sig Dispense Refill  . acetaminophen (TYLENOL 8 HOUR) 650 MG CR tablet Take 650 mg by mouth every morning.    . Marland KitchenLPRAZolam (XANAX XR) 1 MG 24 hr tablet Take 1 mg by mouth 2 (two) times daily.    . Marland KitchenLPRAZolam (XANAX) 1 MG tablet Take 0.5 mg by mouth as directed. Takes with xanax XR 23m35mne bid    . glucosamine-chondroitin 500-400 MG tablet Take 2 tablets by mouth daily.    . lMarland KitchenmoTRIgine (LAMICTAL) 200 MG tablet Take 400 mg by mouth at bedtime.     . Multiple Vitamins-Minerals (OCUVITE PO) Take 1 tablet by mouth daily.    . Multiple Vitamins-Minerals (OCUVITE PO) Take 1 tablet by mouth daily.    . QUEtiapine (SEROQUEL) 200 MG tablet Take 400 mg by mouth at bedtime.     . Vitamin D, Ergocalciferol, (DRISDOL) 50000 units CAPS capsule Take 50,000 Units by mouth every 7 (seven) days.     No current facility-administered medications for this visit.     PAST MEDICAL HISTORY: Past Medical History:  Diagnosis Date  . Anxiety   . Bipolar 1 disorder ([place] . 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 [place] [facility]  . [facility]  .  Depression   . High cholesterol   . Hypertension   . IBS (irritable bowel syndrome)   . Impaired fasting glucose   . Memory loss   . OA (osteoarthritis)   . Obesity   . Personal history of chemotherapy 2009  . Personal history of radiation therapy 2009  . Polycystic ovarian disease   . 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 Sleep apnea     PAST SURGICAL HISTORY: Past Surgical History:  Procedure Laterality Date  . ABDOMINAL HYSTERECTOMY    . APPENDECTOMY    . BREAST BIOPSY Left 2011  . BREAST BIOPSY  2008  . BREAST LUMPECTOMY Left 2008  . CHOLECYSTECTOMY    . COLONOSCOPY N/A 09/13/2013    Procedure: COLONOSCOPY;  Surgeon: [name], MD;  [facility];  Service: Endoscopy;  Laterality: N/A;  730  . EP IMPLANTABLE DEVICE N/A 01/06/2016   Procedure: Pacemaker Implant;  Surgeon: [name], MD;  Location: [place] CV LAB;  Service: Cardiovascular;  Laterality: N/A;  . ESOPHAGOGASTRODUODENOSCOPY N/A 01/17/2014   Procedure: ESOPHAGOGASTRODUODENOSCOPY (EGD);  Surgeon: [name], MD;  [facility];  Service: Endoscopy;  Laterality: N/A;  245  . TOTAL KNEE ARTHROPLASTY Bilateral right knee   2012, 2007    FAMILY HISTORY: Family History  Problem Relation Age of Onset  . Bipolar disorder Mother   . Diabetes Mother   . Bipolar disorder Sister   . Diabetes Sister   . Alcohol abuse Father   . Hypertension Father   . Heart disease Other     SOCIAL HISTORY:  Social History   Socioeconomic History  . Marital status: Married    Spouse name: Not on file  . Number of children: 3  . Years of education: college  . Highest education level: Associate degree: occupational, Hotel manager, or vocational program  Occupational History  . Occupation: Therapist, sports  Social Needs  . Financial resource strain: Not on file  . Food insecurity:    Worry: Not on file    Inability: Not on file  . Transportation needs:    Medical: Not on file    Non-medical: Not on file  Tobacco Use  . Smoking status: Never Smoker  . Smokeless tobacco: Never Used  Substance and Sexual Activity  . Alcohol use: No    Alcohol/week: 0.0 oz  . Drug use: No  . Sexual activity: Never  Lifestyle  . Physical activity:    Days per week: Not on file    Minutes per session: Not on file  . Stress: Not on file  Relationships  . Social connections:    Talks on phone: Not on file    Gets together: Not on file    Attends religious service: Not on file    Active member of club or organization: Not on file    Attends meetings of clubs or organizations: Not on file    Relationship status: Not  on file  . Intimate partner violence:    Fear of current or ex partner: Not on file    Emotionally abused: Not on file    Physically abused: Not on file    Forced sexual activity: Not on file  Other Topics Concern  . Not on file  Social History Narrative   04/24/2013 [name] was born and grew up in [place]. She reports that her childhood was "tough." She has 2 older sisters and a younger brother. She achieved an Associates Degree  in nursing. 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 She has 2 daughters and one son. She denies any legal difficulties. She affiliates as Engineer, manufacturing. Her hobbies include reading, sewing, crafts, and cooking. She reports that her social support system consists of her friend and passed her. 04/24/2013 Andrea      Lives alone.   Right-handed.   No caffeine use.     PHYSICAL EXAM   Vitals:   12/12/17 0907  BP: (!) 141/74  Pulse: 68  Weight: 270 lb (122.5 kg)  Height: 5' 4"  (1.626 m)    Not recorded      Body mass index is 46.35 kg/m.  PHYSICAL EXAMNIATION:  Gen: NAD, conversant, well nourised, obese, well groomed                     Cardiovascular: Regular rate rhythm, no peripheral edema, warm, nontender. Eyes: Conjunctivae clear without exudates or hemorrhage Neck: Supple, no carotid bruits. Pulmonary: Clear to auscultation bilaterally   NEUROLOGICAL EXAM:  MENTAL STATUS: MMSE - Mini Mental State Exam 12/12/2017  Orientation to time 5  Orientation to Place 5  Registration 3  Attention/ Calculation 5  Recall 2  Language- name 2 objects 2  Language- repeat 1  Language- follow 3 step command 3  Language- read & follow direction 1  Write a sentence 1  Copy design 1  Total score 29  animal naming 10   CRANIAL NERVES: CN II: Visual fields are full to confrontation. Fundoscopic exam is normal with sharp discs and no vascular changes. Pupils are round equal and briskly reactive to  light. CN III, IV, VI: extraocular movement are normal. No ptosis. CN V: Facial sensation is intact to pinprick in all 3 divisions bilaterally. Corneal responses are intact.  CN VII: Face is symmetric with normal eye closure and smile. CN VIII: Hearing is normal to rubbing fingers CN IX, X: Palate elevates symmetrically. Phonation is normal. CN XI: Head turning and shoulder shrug are intact CN XII: Tongue is midline with normal movements and no atrophy.  MOTOR: There is no pronator drift of out-stretched arms. Muscle bulk and tone are normal. Muscle strength is normal.  REFLEXES: Reflexes are 2+ and symmetric at the biceps, triceps, knees, and ankles. Plantar responses are flexor.  SENSORY: Intact to light touch, pinprick, positional sensation and vibratory sensation are intact in fingers and toes.  COORDINATION: Rapid alternating movements and fine finger movements are intact. There is no dysmetria on finger-to-nose and heel-knee-shin.    GAIT/STANCE: Posture is normal. Gait is steady with normal steps, base, arm swing, and turning. Heel and toe walking are normal. Tandem gait is normal.  Romberg is absent.   DIAGNOSTIC DATA (LABS, IMAGING, TESTING) - I reviewed patient records, labs, notes, testing and imaging myself where available.   ASSESSMENT AND PLAN  [name] is a 67 y.o. female   Memory loss  In the setting of polypharmacy, bipolar disorder, depression, family stress,  CT head without contrast  Laboratory evaluations,  She does has a family history of dementia, if there is evidence of significant brain atrophy, on a yearly basis,  She would benefit exercise, optimize control for depression   Marcial Pacas, M.D. Ph.D.  [facility] [address], [place], [place] [address] Ph: [phone] Fax: [phone]  CC: Chucky May, MD, Kathyrn Drown, MD

## 2017-12-12 NOTE — Telephone Encounter (Signed)
Health team auth: [employer] per their website order sent to GI. They will reach out to the pt to schedule.

## 2017-12-13 LAB — C-REACTIVE PROTEIN: CRP: 2.2 mg/L (ref 0.0–4.9)

## 2017-12-13 LAB — SEDIMENTATION RATE: Sed Rate: 13 mm/hr (ref 0–40)

## 2017-12-13 LAB — CK: Total CK: 117 U/L (ref 24–173)

## 2017-12-13 LAB — VITAMIN B12: Vitamin B-12: 866 pg/mL (ref 232–1245)

## 2018-01-05 ENCOUNTER — Encounter: Payer: Self-pay | Admitting: Internal Medicine

## 2018-01-05 ENCOUNTER — Ambulatory Visit: Payer: PPO | Admitting: Internal Medicine

## 2018-01-05 ENCOUNTER — Ambulatory Visit (INDEPENDENT_AMBULATORY_CARE_PROVIDER_SITE_OTHER): Payer: PPO | Admitting: *Deleted

## 2018-01-05 ENCOUNTER — Encounter: Payer: Self-pay | Admitting: Cardiology

## 2018-01-05 VITALS — BP 140/74 | HR 67 | Ht 64.0 in | Wt 272.0 lb

## 2018-01-05 DIAGNOSIS — I495 Sick sinus syndrome: Secondary | ICD-10-CM

## 2018-01-05 DIAGNOSIS — Z950 Presence of cardiac pacemaker: Secondary | ICD-10-CM

## 2018-01-05 LAB — CUP PACEART INCLINIC DEVICE CHECK
Brady Statistic RV Percent Paced: 0 %
Implantable Lead Location: 753859
Implantable Lead Location: 753860
Implantable Lead Model: 377
Lead Channel Impedance Value: 468 Ohm
Lead Channel Impedance Value: 663 Ohm
Lead Channel Pacing Threshold Pulse Width: 0.4 ms
Lead Channel Pacing Threshold Pulse Width: 0.4 ms
Lead Channel Sensing Intrinsic Amplitude: 3.2 mV
Lead Channel Setting Pacing Amplitude: 2 V
MDC IDC LEAD IMPLANT DT: 20170627
MDC IDC LEAD IMPLANT DT: 20170627
MDC IDC LEAD SERIAL: 49436227
MDC IDC LEAD SERIAL: 49471106
MDC IDC MSMT LEADCHNL RA PACING THRESHOLD AMPLITUDE: 0.9 V
MDC IDC MSMT LEADCHNL RV PACING THRESHOLD AMPLITUDE: 0.7 V
MDC IDC MSMT LEADCHNL RV SENSING INTR AMPL: 12.9 mV
MDC IDC PG IMPLANT DT: 20170627
MDC IDC SESS DTM: 20190627140400
MDC IDC SET LEADCHNL RV PACING AMPLITUDE: 2.4 V
MDC IDC SET LEADCHNL RV PACING PULSEWIDTH: 0.4 ms
MDC IDC STAT BRADY RA PERCENT PACED: 37 %
Pulse Gen Serial Number: 68786455

## 2018-01-05 NOTE — Progress Notes (Signed)
Remote pacemaker transmission.   

## 2018-01-05 NOTE — Patient Instructions (Signed)
Medication Instructions:  Your physician recommends that you continue on your current medications as directed. Please refer to the Current Medication list given to you today.   Labwork: NONE   Testing/Procedures: NONE   Follow-Up: Your physician wants you to follow-up in: 1 Year. You will receive a reminder letter in the mail two months in advance. If you don't receive a letter, please call our office to schedule the follow-up appointment.   Any Other Special Instructions Will Be Listed Below (If Applicable).     If you need a refill on your cardiac medications before your next appointment, please call your pharmacy.  Thank you for choosing [facility]!   

## 2018-01-05 NOTE — Progress Notes (Signed)
HPI [name] returns today for ongoing followup of sinus node dysfunction, s/p Biotronik PPM insertion. She is a pleasant 67 yo woman with symptomatic bradycardia who underwent PPM insertion about a year ago. 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 No chest pain or sob. She admits to being more sedentary. No syncope.  Allergies  Allergen Reactions  . Imipramine Hives and Rash  . Tricor [Fenofibrate] Other (See Comments)    Pain, "aching"     Current Outpatient Medications  Medication Sig Dispense Refill  . acetaminophen (TYLENOL 8 HOUR) 650 MG CR tablet Take 650 mg by mouth every morning.    [name] ALPRAZolam (XANAX XR) 1 MG 24 hr tablet Take 1 mg by mouth daily.     [name] ALPRAZolam (XANAX) 1 MG tablet Take 0.5 mg by mouth as directed. Takes with xanax XR 1mg  one bid    . glucosamine-chondroitin 500-400 MG tablet Take 2 tablets by mouth daily.    [name] lamoTRIgine (LAMICTAL) 200 MG tablet Take 400 mg by mouth at bedtime.     . multivitamin-lutein (OCUVITE-LUTEIN) CAPS capsule Take 1 capsule by mouth daily.    . QUEtiapine (SEROQUEL) 200 MG tablet Take 400 mg by mouth at bedtime.     . Vitamin D, Ergocalciferol, (DRISDOL) 50000 units CAPS capsule Take 50,000 Units by mouth every 7 (seven) days.     No current facility-administered medications for this visit.      Past Medical History:  Diagnosis Date  . Anxiety   . Bipolar 1 disorder ([place])   . 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 Breast cancer, left breast ([place]) 07/22/2011  . Cancer [facility]) breast cancer 7 yrs ago  . Depression   . High cholesterol   . Hypertension   . IBS (irritable bowel syndrome)   . Impaired fasting glucose   . Memory loss   . OA (osteoarthritis)   . Obesity   . Personal history of chemotherapy 2009  . Personal history of radiation therapy 2009  . Polycystic ovarian disease   . 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 Sleep apnea     ROS:   All systems reviewed and negative except as noted in the HPI.   Past Surgical History:  Procedure Laterality Date  . ABDOMINAL HYSTERECTOMY    . APPENDECTOMY    . BREAST BIOPSY Left 2011  . BREAST BIOPSY  2008  . BREAST LUMPECTOMY Left 2008  . CHOLECYSTECTOMY    . COLONOSCOPY N/A 09/13/2013   Procedure: COLONOSCOPY;  Surgeon: [name], MD;  [facility]: [facility];  Service: Endoscopy;  Laterality: N/A;  730  . EP IMPLANTABLE DEVICE N/A 01/06/2016   Procedure: Pacemaker Implant;  Surgeon: [name], MD;  Location: [facility];  Service: Cardiovascular;  Laterality: N/A;  . ESOPHAGOGASTRODUODENOSCOPY N/A 01/17/2014   Procedure: ESOPHAGOGASTRODUODENOSCOPY (EGD);  Surgeon: [name], MD;  Location: [facility];  Service: Endoscopy;  Laterality: N/A;  245  . TOTAL KNEE ARTHROPLASTY Bilateral right knee   2012, 2007     Family History  Problem Relation Age of Onset  . Bipolar disorder Mother   . Diabetes Mother   . Bipolar disorder Sister   . Diabetes Sister   . Alcohol abuse Father   . Hypertension Father   . Heart disease Other      Social History   Socioeconomic History  . Marital status: Married  Spouse name: Not on file  . Number of children: 3  . Years of education: college  . Highest education level: Associate degree: occupational, Hotel manager, or vocational program  Occupational History  . Occupation: Therapist, sports  Social Needs  . Financial resource strain: Not on file  . Food insecurity:    Worry: Not on file    Inability: Not on file  . Transportation needs:    Medical: Not on file    Non-medical: Not on file  Tobacco Use  . Smoking status: Never Smoker  . Smokeless tobacco: Never Used  Substance and Sexual Activity  . Alcohol use: No    Alcohol/week: 0.0 oz  . Drug use: No  . Sexual activity: Never  Lifestyle  . Physical activity:    Days per week: Not on file    Minutes per session: Not on file  . Stress: Not on file  Relationships    . Social connections:    Talks on phone: Not on file    Gets together: Not on file    Attends religious service: Not on file    Active member of club or organization: Not on file    Attends meetings of clubs or organizations: Not on file    Relationship status: Not on file  . Intimate partner violence:    Fear of current or ex partner: Not on file    Emotionally abused: Not on file    Physically abused: Not on file    Forced sexual activity: Not on file  Other Topics Concern  . Not on file  Social History Narrative   04/24/2013 [name] was born and grew up in [place]. She reports that her childhood was "tough." She has 2 older sisters and a younger brother. She achieved an Associates Degree in nursing. 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 She has 2 daughters and one son. She denies any legal difficulties. She affiliates as Engineer, manufacturing. Her hobbies include reading, sewing, crafts, and cooking. She reports that her social support system consists of her friend and passed her. 04/24/2013 [name]      Lives alone.   Right-handed.   No caffeine use.     BP 140/74   Pulse 67   Ht 5\' 4"  (1.626 m)   Wt 272 lb (123.4 kg)   SpO2 96%   BMI 46.69 kg/m   Physical Exam:  Well appearing NAD HEENT: Unremarkable Neck:  No JVD, no thyromegally Lymphatics:  No adenopathy Back:  No CVA tenderness Lungs:  Clear HEART:  Regular rate rhythm, no murmurs, no rubs, no clicks Abd:  soft, positive bowel sounds, no organomegally, no rebound, no guarding Ext:  2 plus pulses, no edema, no cyanosis, no clubbing Skin:  No rashes no nodules Neuro:  CN II through XII intact, motor grossly intact  EKG - NSR with atrial pacing  DEVICE  Normal device function.  See PaceArt for details.   Assess/Plan: 1. Sinus node dysfunction - she is asymptomatic, s/p PPM insertion. 2. PPM - her Biotronik DDD PM is working normally with over 9 years of  battery longevity. 3. HTN - her blood pressure is up a bit. I have asked her to eat less salt and to lose weight. 4. Obesity - she is strongly encouraged to lose weight. I have asked her to increase her activity.  Mikle [email or web address].D.

## 2018-01-06 LAB — CUP PACEART REMOTE DEVICE CHECK
Implantable Lead Implant Date: 20170627
Implantable Lead Model: 377
Implantable Lead Serial Number: 49436227
Implantable Pulse Generator Implant Date: 20170627
Lead Channel Setting Pacing Amplitude: 2 V
Lead Channel Setting Pacing Amplitude: 2.4 V
MDC IDC LEAD IMPLANT DT: 20170627
MDC IDC LEAD LOCATION: 753859
MDC IDC LEAD LOCATION: 753860
MDC IDC LEAD SERIAL: 49471106
MDC IDC MSMT BATTERY REMAINING PERCENTAGE: 85 %
MDC IDC SESS DTM: 20190628081124
MDC IDC SET LEADCHNL RV PACING PULSEWIDTH: 0.4 ms
Pulse Gen Serial Number: 68786455

## 2018-01-10 ENCOUNTER — Ambulatory Visit
Admission: RE | Admit: 2018-01-10 | Discharge: 2018-01-10 | Disposition: A | Discharge status: Home / Self Care | Payer: PPO | Source: Ambulatory Visit | Attending: Neurology | Admitting: Neurology

## 2018-01-10 DIAGNOSIS — R413 Other amnesia: Secondary | ICD-10-CM | POA: Diagnosis not present

## 2018-01-16 ENCOUNTER — Telehealth: Payer: Self-pay | Admitting: *Deleted

## 2018-01-16 NOTE — Telephone Encounter (Signed)
-----   Message from Marcial Pacas, MD sent at 01/16/2018 10:47 AM EDT ----- Please call patient for No acute abnormality on CT of the brain

## 2018-01-16 NOTE — Telephone Encounter (Signed)
Spoke to patient - she is aware of CT results.

## 2018-03-20 ENCOUNTER — Telehealth: Payer: Self-pay

## 2018-03-20 NOTE — Telephone Encounter (Signed)
With the patient being asymptomatic it would be fine for [place] to see her this week-you can put her in for tomorrow or Wednesday

## 2018-03-20 NOTE — Telephone Encounter (Signed)
Pt is aware and was transferred up front for an appt.

## 2018-03-20 NOTE — Telephone Encounter (Signed)
Patient called this am stating she has been having issues with her bp being elevated in the vicinity of 150/100. She states she is asymptomatic, no dizziness,no headaches,no nausea, no vomiting. I advised that we did not have any available appt's for today. Advised to be seen at urgent care or Ed. She states understanding.

## 2018-03-20 NOTE — Telephone Encounter (Signed)
I called and left a message to r/c. 

## 2018-03-24 ENCOUNTER — Ambulatory Visit (INDEPENDENT_AMBULATORY_CARE_PROVIDER_SITE_OTHER): Payer: PPO | Admitting: Family Medicine

## 2018-03-24 ENCOUNTER — Encounter: Payer: Self-pay | Admitting: Family Medicine

## 2018-03-24 VITALS — BP 138/86 | Ht 64.0 in | Wt 278.0 lb

## 2018-03-24 DIAGNOSIS — E781 Pure hyperglyceridemia: Secondary | ICD-10-CM | POA: Diagnosis not present

## 2018-03-24 DIAGNOSIS — Z131 Encounter for screening for diabetes mellitus: Secondary | ICD-10-CM

## 2018-03-24 NOTE — Patient Instructions (Signed)
DASH Eating Plan DASH stands for "Dietary Approaches to Stop Hypertension." The DASH eating plan is a healthy eating plan that has been shown to reduce high blood pressure (hypertension). It may also reduce your risk for type 2 diabetes, heart disease, and stroke. The DASH eating plan may also help with weight loss. What are tips for following this plan? General guidelines  Avoid eating more than 2,300 mg (milligrams) of salt (sodium) a day. If you have hypertension, you may need to reduce your sodium intake to 1,500 mg a day.  Limit alcohol intake to no more than 1 drink a day for nonpregnant women and 2 drinks a day for men. One drink equals 12 oz of beer, 5 oz of wine, or 1 oz of hard liquor.  Work with your health care provider to maintain a healthy body weight or to lose weight. Ask what an ideal weight is for you.  Get at least 30 minutes of exercise that causes your heart to beat faster (aerobic exercise) most days of the week. Activities may include walking, swimming, or biking.  Work with your health care provider or diet and nutrition specialist (dietitian) to adjust your eating plan to your individual calorie needs. Reading food labels  Check food labels for the amount of sodium per serving. Choose foods with less than 5 percent of the Daily Value of sodium. Generally, foods with less than 300 mg of sodium per serving fit into this eating plan.  To find whole grains, look for the word "whole" as the first word in the ingredient list. Shopping  Buy products labeled as "low-sodium" or "no salt added."  Buy fresh foods. Avoid canned foods and premade or frozen meals. Cooking  Avoid adding salt when cooking. Use salt-free seasonings or herbs instead of table salt or sea salt. Check with your health care provider or pharmacist before using salt substitutes.  Do not fry foods. Cook foods using healthy methods such as baking, boiling, grilling, and broiling instead.  Cook with  heart-healthy oils, such as olive, canola, soybean, or sunflower oil. Meal planning   Eat a balanced diet that includes: ? 5 or more servings of fruits and vegetables each day. At each meal, try to fill half of your plate with fruits and vegetables. ? Up to 6-8 servings of whole grains each day. ? Less than 6 oz of lean meat, poultry, or fish each day. A 3-oz serving of meat is about the same size as a deck of cards. One egg equals 1 oz. ? 2 servings of low-fat dairy each day. ? A serving of nuts, seeds, or beans 5 times each week. ? Heart-healthy fats. Healthy fats called Omega-3 fatty acids are found in foods such as flaxseeds and coldwater fish, like sardines, salmon, and mackerel.  Limit how much you eat of the following: ? Canned or prepackaged foods. ? Food that is high in trans fat, such as fried foods. ? Food that is high in saturated fat, such as fatty meat. ? Sweets, desserts, sugary drinks, and other foods with added sugar. ? Full-fat dairy products.  Do not salt foods before eating.  Try to eat at least 2 vegetarian meals each week.  Eat more home-cooked food and less restaurant, buffet, and fast food.  When eating at a restaurant, ask that your food be prepared with less salt or no salt, if possible. What foods are recommended? The items listed may not be a complete list. Talk with your dietitian about what   dietary choices are best for you. Grains Whole-grain or whole-wheat bread. Whole-grain or whole-wheat pasta. Brown rice. Oatmeal. Quinoa. Bulgur. Whole-grain and low-sodium cereals. Pita bread. Low-fat, low-sodium crackers. Whole-wheat flour tortillas. Vegetables Fresh or frozen vegetables (raw, steamed, roasted, or grilled). Low-sodium or reduced-sodium tomato and vegetable juice. Low-sodium or reduced-sodium tomato sauce and tomato paste. Low-sodium or reduced-sodium canned vegetables. Fruits All fresh, dried, or frozen fruit. Canned fruit in natural juice (without  added sugar). Meat and other protein foods Skinless chicken or turkey. Ground chicken or turkey. Pork with fat trimmed off. Fish and seafood. Egg whites. Dried beans, peas, or lentils. Unsalted nuts, nut butters, and seeds. Unsalted canned beans. Lean cuts of beef with fat trimmed off. Low-sodium, lean deli meat. Dairy Low-fat (1%) or fat-free (skim) milk. Fat-free, low-fat, or reduced-fat cheeses. Nonfat, low-sodium ricotta or cottage cheese. Low-fat or nonfat yogurt. Low-fat, low-sodium cheese. Fats and oils Soft margarine without trans fats. Vegetable oil. Low-fat, reduced-fat, or light mayonnaise and salad dressings (reduced-sodium). Canola, safflower, olive, soybean, and sunflower oils. Avocado. Seasoning and other foods Herbs. Spices. Seasoning mixes without salt. Unsalted popcorn and pretzels. Fat-free sweets. What foods are not recommended? The items listed may not be a complete list. Talk with your dietitian about what dietary choices are best for you. Grains Baked goods made with fat, such as croissants, muffins, or some breads. Dry pasta or rice meal packs. Vegetables Creamed or fried vegetables. Vegetables in a cheese sauce. Regular canned vegetables (not low-sodium or reduced-sodium). Regular canned tomato sauce and paste (not low-sodium or reduced-sodium). Regular tomato and vegetable juice (not low-sodium or reduced-sodium). Pickles. Olives. Fruits Canned fruit in a light or heavy syrup. Fried fruit. Fruit in cream or butter sauce. Meat and other protein foods Fatty cuts of meat. Ribs. Fried meat. Bacon. Sausage. Bologna and other processed lunch meats. Salami. Fatback. Hotdogs. Bratwurst. Salted nuts and seeds. Canned beans with added salt. Canned or smoked fish. Whole eggs or egg yolks. Chicken or turkey with skin. Dairy Whole or 2% milk, cream, and half-and-half. Whole or full-fat cream cheese. Whole-fat or sweetened yogurt. Full-fat cheese. Nondairy creamers. Whipped toppings.  Processed cheese and cheese spreads. Fats and oils Butter. Stick margarine. Lard. Shortening. Ghee. Bacon fat. Tropical oils, such as coconut, palm kernel, or palm oil. Seasoning and other foods Salted popcorn and pretzels. Onion salt, garlic salt, seasoned salt, table salt, and sea salt. Worcestershire sauce. Tartar sauce. Barbecue sauce. Teriyaki sauce. Soy sauce, including reduced-sodium. Steak sauce. Canned and packaged gravies. Fish sauce. Oyster sauce. Cocktail sauce. Horseradish that you find on the shelf. Ketchup. Mustard. Meat flavorings and tenderizers. Bouillon cubes. Hot sauce and Tabasco sauce. Premade or packaged marinades. Premade or packaged taco seasonings. Relishes. Regular salad dressings. Where to find more information:  National Heart, Lung, and Blood Institute: www.nhlbi.nih.gov  American Heart Association: www.heart.org Summary  The DASH eating plan is a healthy eating plan that has been shown to reduce high blood pressure (hypertension). It may also reduce your risk for type 2 diabetes, heart disease, and stroke.  With the DASH eating plan, you should limit salt (sodium) intake to 2,300 mg a day. If you have hypertension, you may need to reduce your sodium intake to 1,500 mg a day.  When on the DASH eating plan, aim to eat more fresh fruits and vegetables, whole grains, lean proteins, low-fat dairy, and heart-healthy fats.  Work with your health care provider or diet and nutrition specialist (dietitian) to adjust your eating plan to your individual   calorie needs. This information is not intended to replace advice given to you by your health care provider. Make sure you discuss any questions you have with your health care provider. Document Released: 06/17/2011 Document Revised: 06/21/2016 Document Reviewed: 06/21/2016 Elsevier Interactive Patient Education  2018 Elsevier Inc.  

## 2018-03-24 NOTE — Progress Notes (Signed)
   Subjective:    Patient ID: [name], female    DOB: 01-22-1951, 67 y.o.   MRN: [id number]  Hypertension  This is a new problem. Pertinent negatives include no chest pain, headaches or shortness of breath.  Patient states she has not done a great job watching her diet Not done a great job exercise Plans on getting much better in these areas Denies any chest tightness pressure pain  Had bp checked at a health screening ( 170/104 and 160/100. Pt started checking at home and it has been running 150's over 100's. She states she only has a small bp cuff at home.   Check spot on left arm. Pt states it looks puffy.    Review of Systems  Constitutional: Negative for activity change, fatigue and fever.  HENT: Negative for congestion and rhinorrhea.   Respiratory: Negative for cough, chest tightness and shortness of breath.   Cardiovascular: Negative for chest pain and leg swelling.  Gastrointestinal: Negative for abdominal pain and nausea.  Skin: Negative for color change.  Neurological: Negative for dizziness and headaches.  Psychiatric/Behavioral: Negative for agitation and behavioral problems.       Objective:   Physical Exam  Constitutional: She appears well-nourished. No distress.  HENT:  Head: Normocephalic and atraumatic.  Eyes: Right eye exhibits no discharge. Left eye exhibits no discharge.  Neck: No tracheal deviation present.  Cardiovascular: Normal rate, regular rhythm and normal heart sounds.  No murmur heard. Pulmonary/Chest: Effort normal and breath sounds normal. No respiratory distress.  Musculoskeletal: She exhibits no edema.  Lymphadenopathy:    She has no cervical adenopathy.  Neurological: She is alert. Coordination normal.  Skin: Skin is warm and dry.  Psychiatric: She has a normal mood and affect. Her behavior is normal.  Vitals reviewed.    Benign lesion on the left arm does not appear cancerous     Assessment & Plan:  Mild elevation of  blood pressure stage I approach would be diet and exercise follow-up again within 3 months follow-up sooner problems  On a recent health screening her triglycerides were 400 we will repeat this again she will do this lab work after adhering to a healthy diet for at least 1 month  Hold off on any blood pressure medicine currently.  If the blood pressure gets worse or if it is persistently elevated she will need to be on medicine

## 2018-04-06 ENCOUNTER — Ambulatory Visit (INDEPENDENT_AMBULATORY_CARE_PROVIDER_SITE_OTHER): Payer: PPO | Admitting: *Deleted

## 2018-04-06 DIAGNOSIS — I495 Sick sinus syndrome: Secondary | ICD-10-CM

## 2018-04-06 NOTE — Progress Notes (Signed)
Remote pacemaker transmission.   

## 2018-04-07 ENCOUNTER — Encounter: Payer: Self-pay | Admitting: Cardiology

## 2018-05-05 LAB — CUP PACEART REMOTE DEVICE CHECK
Date Time Interrogation Session: 20191025110251
Implantable Lead Implant Date: 20170627
Implantable Lead Location: 753860
Implantable Lead Model: 377
Implantable Lead Model: 377
Implantable Lead Serial Number: 49471106
Implantable Pulse Generator Implant Date: 20170627
MDC IDC LEAD IMPLANT DT: 20170627
MDC IDC LEAD LOCATION: 753859
MDC IDC LEAD SERIAL: 49436227
Pulse Gen Serial Number: 68786455

## 2018-05-16 DIAGNOSIS — F410 Panic disorder [episodic paroxysmal anxiety] without agoraphobia: Secondary | ICD-10-CM | POA: Diagnosis not present

## 2018-05-16 DIAGNOSIS — F3174 Bipolar disorder, in full remission, most recent episode manic: Secondary | ICD-10-CM | POA: Diagnosis not present

## 2018-05-16 DIAGNOSIS — F3176 Bipolar disorder, in full remission, most recent episode depressed: Secondary | ICD-10-CM | POA: Diagnosis not present

## 2018-05-19 DIAGNOSIS — F3174 Bipolar disorder, in full remission, most recent episode manic: Secondary | ICD-10-CM | POA: Diagnosis not present

## 2018-05-19 DIAGNOSIS — F3176 Bipolar disorder, in full remission, most recent episode depressed: Secondary | ICD-10-CM | POA: Diagnosis not present

## 2018-05-19 DIAGNOSIS — F410 Panic disorder [episodic paroxysmal anxiety] without agoraphobia: Secondary | ICD-10-CM | POA: Diagnosis not present

## 2018-06-15 DIAGNOSIS — E781 Pure hyperglyceridemia: Secondary | ICD-10-CM | POA: Diagnosis not present

## 2018-06-15 DIAGNOSIS — Z131 Encounter for screening for diabetes mellitus: Secondary | ICD-10-CM | POA: Diagnosis not present

## 2018-06-16 LAB — LIPID PANEL
Chol/HDL Ratio: 4 ratio (ref 0.0–4.4)
Cholesterol, Total: 166 mg/dL (ref 100–199)
HDL: 42 mg/dL (ref >39)
LDL CALC: 73 mg/dL (ref 0–99)
Triglycerides: 255 mg/dL — ABNORMAL HIGH (ref 0–149)
VLDL Cholesterol Cal: 51 mg/dL — ABNORMAL HIGH (ref 5–40)

## 2018-06-16 LAB — GLUCOSE, RANDOM: GLUCOSE: 107 mg/dL — AB (ref 65–99)

## 2018-06-19 ENCOUNTER — Telehealth: Payer: Self-pay | Admitting: Family Medicine

## 2018-06-19 ENCOUNTER — Encounter: Payer: Self-pay | Admitting: Family Medicine

## 2018-06-19 ENCOUNTER — Ambulatory Visit (INDEPENDENT_AMBULATORY_CARE_PROVIDER_SITE_OTHER): Payer: PPO | Admitting: Family Medicine

## 2018-06-19 VITALS — BP 126/86 | Ht 64.0 in | Wt 280.0 lb

## 2018-06-19 DIAGNOSIS — R7303 Prediabetes: Secondary | ICD-10-CM

## 2018-06-19 DIAGNOSIS — Z23 Encounter for immunization: Secondary | ICD-10-CM | POA: Diagnosis not present

## 2018-06-19 DIAGNOSIS — M25512 Pain in left shoulder: Secondary | ICD-10-CM | POA: Diagnosis not present

## 2018-06-19 DIAGNOSIS — E781 Pure hyperglyceridemia: Secondary | ICD-10-CM | POA: Diagnosis not present

## 2018-06-19 MED ORDER — ZOSTER VAC RECOMB ADJUVANTED 50 MCG/0.5ML IM SUSR: 0.5000 mL | Freq: Once | INTRAMUSCULAR | 1 refills | Status: AC

## 2018-06-19 NOTE — Telephone Encounter (Signed)
Please have the patient a few weeks before the office visit so we can order lab work at that time

## 2018-06-19 NOTE — Patient Instructions (Addendum)

## 2018-06-19 NOTE — Telephone Encounter (Signed)
Pt has a 6 month follow up appt 12/20/2018 pt advised there was a conversation about blood work for this appt, advise.

## 2018-06-19 NOTE — Telephone Encounter (Signed)
Patient advised per Dr Nicki Reaper to give the office a call a few weeks before the office visit so we can order lab work at that time.  Patient verbalized understanding.

## 2018-06-19 NOTE — Progress Notes (Addendum)
   Subjective:    Patient ID: [name], female    DOB: 1951/03/07, 67 y.o.   MRN: [id number]  HPI Patient is here today to follow up on her chronic illnesses. She has a history of hypertension,but is not currently taking any medications for this. She also has a history of anxiety and depression in which she takes Seroquel and Xanax. She also had a lipid panel and a glucose drawn on June 15, 2018.  Complains of left shoulder pain pulled muscle about a month ago intermittent pain discomfort in the deltoid region no numbness or tingling in the hand has not tried anything to help it at this point  Patient struggles with her weight she does try to do the best job she can eating healthy Review of Systems  Constitutional: Negative for activity change, appetite change and fatigue.  HENT: Negative for congestion and rhinorrhea.   Respiratory: Negative for cough and shortness of breath.   Cardiovascular: Negative for chest pain and leg swelling.  Gastrointestinal: Negative for abdominal pain and diarrhea.  Endocrine: Negative for polydipsia and polyphagia.  Skin: Negative for color change.  Neurological: Negative for dizziness and weakness.  Psychiatric/Behavioral: Negative for behavioral problems and confusion.  Mild impingement on the left side otherwise good range of motion no weakness detected     Objective:   Physical Exam  Constitutional: She appears well-nourished. No distress.  HENT:  Head: Normocephalic and atraumatic.  Eyes: Right eye exhibits no discharge. Left eye exhibits no discharge.  Neck: No tracheal deviation present.  Cardiovascular: Normal rate, regular rhythm and normal heart sounds.  No murmur heard. Pulmonary/Chest: Effort normal and breath sounds normal. No respiratory distress.  Musculoskeletal: She exhibits no edema.  Lymphadenopathy:    She has no cervical adenopathy.  Neurological: She is alert. Coordination normal.  Skin: Skin is warm and dry.    Psychiatric: She has a normal mood and affect. Her behavior is normal.  Vitals reviewed.         Assessment & Plan:  Recent lab work reviewed with patient in detail Hypertriglyceridemia dietary measures is the best approach at this point follow-up again in 6 months  Prediabetes fair control watch diet stay active try to keep weight down repeat lab work 6 months  Shingles vaccine discussed with the patient pneumonia vaccine given today  Left shoulder tendinitis stretching exercises along with strengthening exercises if ongoing trouble follow-up

## 2018-06-19 NOTE — Telephone Encounter (Signed)
Blood work orders only good for 6 months at lab

## 2018-07-06 ENCOUNTER — Ambulatory Visit (INDEPENDENT_AMBULATORY_CARE_PROVIDER_SITE_OTHER): Payer: PPO

## 2018-07-06 DIAGNOSIS — I495 Sick sinus syndrome: Secondary | ICD-10-CM

## 2018-07-06 NOTE — Progress Notes (Signed)
Remote pacemaker transmission.   

## 2018-07-07 LAB — CUP PACEART REMOTE DEVICE CHECK
Implantable Lead Implant Date: 20170627
Implantable Lead Model: 377
Implantable Lead Model: 377
Implantable Pulse Generator Implant Date: 20170627
MDC IDC LEAD IMPLANT DT: 20170627
MDC IDC LEAD LOCATION: 753859
MDC IDC LEAD LOCATION: 753860
MDC IDC LEAD SERIAL: 49436227
MDC IDC LEAD SERIAL: 49471106
MDC IDC SESS DTM: 20191227124551
Pulse Gen Serial Number: 68786455

## 2018-07-29 ENCOUNTER — Encounter: Payer: Self-pay | Admitting: Family Medicine

## 2018-08-03 ENCOUNTER — Other Ambulatory Visit: Payer: Self-pay | Admitting: Family Medicine

## 2018-08-03 DIAGNOSIS — Z1231 Encounter for screening mammogram for malignant neoplasm of breast: Secondary | ICD-10-CM

## 2018-08-09 ENCOUNTER — Encounter: Payer: Self-pay | Admitting: Orthopaedic Surgery

## 2018-08-09 ENCOUNTER — Ambulatory Visit: Payer: PPO | Admitting: Orthopaedic Surgery

## 2018-08-09 ENCOUNTER — Ambulatory Visit (INDEPENDENT_AMBULATORY_CARE_PROVIDER_SITE_OTHER): Payer: PPO

## 2018-08-09 VITALS — BP 132/74 | HR 82 | Ht 64.0 in | Wt 277.0 lb

## 2018-08-09 DIAGNOSIS — M25512 Pain in left shoulder: Secondary | ICD-10-CM | POA: Diagnosis not present

## 2018-08-09 NOTE — Progress Notes (Signed)
Subjective:    Patient ID: [name], female    DOB: May 31, 1951, 68 y.o.   MRN: [id number]  HPI She has had pain in the left shoulder for about three months.  She was pulling on a door then and felt soreness in the shoulder on the left.  It was sore a few days then went away.  Tenderness came back and it has slowly been more painful.  She has good and bad days.  She has pain with overhead use.  She has no numbness, no swelling or redness.  She is concerned that is it still tender now over this period of time.   Review of Systems  Constitutional: Positive for activity change.  Musculoskeletal: Positive for arthralgias.  Psychiatric/Behavioral: The patient is nervous/anxious.   All other systems reviewed and are negative.  For Review of Systems, all other systems reviewed and are negative.  The following is a summary of the past history medically, past history surgically, known current medicines, social history and family history.  This information is gathered electronically by the computer from prior information and documentation.  I review this each visit and have found including this information at this point in the chart is beneficial and informative.   Past Medical History:  Diagnosis Date  . Anxiety   . Bipolar 1 disorder ([place])   . 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 Breast cancer, left breast ([place]) 07/22/2011  . Cancer [facility]) breast cancer 7 yrs ago  . Depression   . High cholesterol   . Hypertension   . IBS (irritable bowel syndrome)   . Impaired fasting glucose   . Memory loss   . OA (osteoarthritis)   . Obesity   . Personal history of chemotherapy 2009  . Personal history of radiation therapy 2009  . Polycystic ovarian disease   . 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 Sleep apnea     Past Surgical History:  Procedure Laterality Date  . ABDOMINAL HYSTERECTOMY    . APPENDECTOMY    . BREAST BIOPSY Left  2011  . BREAST BIOPSY  2008  . BREAST LUMPECTOMY Left 2008  . CHOLECYSTECTOMY    . COLONOSCOPY N/A 09/13/2013   Procedure: COLONOSCOPY;  Surgeon: [name], MD;  [facility];  Service: Endoscopy;  Laterality: N/A;  730  . EP IMPLANTABLE DEVICE N/A 01/06/2016   Procedure: Pacemaker Implant;  Surgeon: [name], MD;  Location: [place] CV LAB;  Service: Cardiovascular;  Laterality: N/A;  . ESOPHAGOGASTRODUODENOSCOPY N/A 01/17/2014   Procedure: ESOPHAGOGASTRODUODENOSCOPY (EGD);  Surgeon: [name], MD;  [facility];  Service: Endoscopy;  Laterality: N/A;  245  . TOTAL KNEE ARTHROPLASTY Bilateral right knee   2012, 2007    Current Outpatient Medications on File Prior to Visit  Medication Sig Dispense Refill  . acetaminophen (TYLENOL 8 HOUR) 650 MG CR tablet Take 650 mg by mouth every morning. Two Q am and one Qhs    . ALPRAZolam (XANAX XR) 1 MG 24 hr tablet Take 1 mg by mouth daily.     [name] ALPRAZolam (XANAX) 1 MG tablet Take 0.5 mg by mouth as directed. Takes with xanax XR 1mg  one bid    . glucosamine-chondroitin 500-400 MG tablet Take 2 tablets by mouth daily.    [name] lamoTRIgine (LAMICTAL) 200 MG tablet Take 400 mg by mouth at bedtime.     . Multiple Vitamins-Minerals (OCUVITE PO)  Take by mouth daily.    . QUEtiapine (SEROQUEL) 200 MG tablet Take 400 mg by mouth at bedtime.     . Vitamin D, Ergocalciferol, (DRISDOL) 50000 units CAPS capsule Take 50,000 Units by mouth every 7 (seven) days.     No current facility-administered medications on file prior to visit.     Social History   Socioeconomic History  . Marital status: Married    Spouse name: Not on file  . Number of children: 3  . Years of education: college  . Highest education level: Associate degree: occupational, Hotel manager, or vocational program  Occupational History  . Occupation: Therapist, sports  Social Needs  . Financial resource strain: Not on file  . Food insecurity:    Worry: Not on file     Inability: Not on file  . Transportation needs:    Medical: Not on file    Non-medical: Not on file  Tobacco Use  . Smoking status: Never Smoker  . Smokeless tobacco: Never Used  Substance and Sexual Activity  . Alcohol use: No    Alcohol/week: 0.0 standard drinks  . Drug use: No  . Sexual activity: Never  Lifestyle  . Physical activity:    Days per week: Not on file    Minutes per session: Not on file  . Stress: Not on file  Relationships  . Social connections:    Talks on phone: Not on file    Gets together: Not on file    Attends religious service: Not on file    Active member of club or organization: Not on file    Attends meetings of clubs or organizations: Not on file    Relationship status: Not on file  . Intimate partner violence:    Fear of current or ex partner: Not on file    Emotionally abused: Not on file    Physically abused: Not on file    Forced sexual activity: Not on file  Other Topics Concern  . Not on file  Social History Narrative   04/24/2013 [name] was born and grew up in [place]. She reports that her childhood was "tough." She has 2 older sisters and a younger brother. She achieved an Associates Degree in nursing. 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 She has 2 daughters and one son. She denies any legal difficulties. She affiliates as Engineer, manufacturing. Her hobbies include reading, sewing, crafts, and cooking. She reports that her social support system consists of her friend and passed her. 04/24/2013 [name]      Lives alone.   Right-handed.   No caffeine use.    Family History  Problem Relation Age of Onset  . Bipolar disorder Mother   . Diabetes Mother   . Bipolar disorder Sister   . Diabetes Sister   . Alcohol abuse Father   . Hypertension Father   . Heart disease Other     BP 132/74   Pulse 82   Ht 5\' 4"  (1.626 m)   Wt 277 lb (125.6 kg)   BMI 47.55 kg/m   Body mass index is  47.55 kg/m.     Objective:   Physical Exam Constitutional:      Appearance: She is well-developed.  HENT:     Head: Normocephalic and atraumatic.  Eyes:     Conjunctiva/sclera: Conjunctivae normal.     Pupils: Pupils are equal, round, and reactive to light.  Neck:  Musculoskeletal: Normal range of motion and neck supple.  Cardiovascular:     Rate and Rhythm: Normal rate and regular rhythm.  Pulmonary:     Effort: Pulmonary effort is normal.  Abdominal:     Palpations: Abdomen is soft.  Musculoskeletal:     Left shoulder: She exhibits decreased range of motion and tenderness.       Arms:  Skin:    General: Skin is warm and dry.  Neurological:     Mental Status: She is alert and oriented to person, place, and time.     Cranial Nerves: No cranial nerve deficit.     Motor: No abnormal muscle tone.     Coordination: Coordination normal.     Deep Tendon Reflexes: Reflexes are normal and symmetric. Reflexes normal.  Psychiatric:        Behavior: Behavior normal.        Thought Content: Thought content normal.        Judgment: Judgment normal.      X-rays were done of the left shoulder, reported separately.     Assessment & Plan:   Encounter Diagnosis  Name Primary?  . Pain in joint of left shoulder Yes   PROCEDURE NOTE:  The patient request injection, verbal consent was obtained.  The left shoulder was prepped appropriately after time out was performed.   Sterile technique was observed and injection of 1 cc of Depo-Medrol 40 mg with several cc's of plain xylocaine. Anesthesia was provided by ethyl chloride and a 20-gauge needle was used to inject the shoulder area. A posterior approach was used.  The injection was tolerated well.  A band aid dressing was applied.  The patient was advised to apply ice later today and tomorrow to the injection sight as needed.  I will see her in one month.  Call if any problem.  Precautions discussed.   Electronically  Signed Sanjuana Kava, MD 1/29/202010:05 AM

## 2018-09-05 ENCOUNTER — Encounter (INDEPENDENT_AMBULATORY_CARE_PROVIDER_SITE_OTHER): Payer: Self-pay | Admitting: *Deleted

## 2018-09-06 ENCOUNTER — Ambulatory Visit: Payer: PPO | Admitting: Orthopaedic Surgery

## 2018-09-06 ENCOUNTER — Encounter: Payer: Self-pay | Admitting: Orthopaedic Surgery

## 2018-09-06 VITALS — BP 132/79 | HR 72 | Ht 64.0 in | Wt 276.0 lb

## 2018-09-06 DIAGNOSIS — M25512 Pain in left shoulder: Secondary | ICD-10-CM | POA: Diagnosis not present

## 2018-09-06 DIAGNOSIS — Z6841 Body Mass Index (BMI) 40.0 and over, adult: Secondary | ICD-10-CM | POA: Diagnosis not present

## 2018-09-06 NOTE — Progress Notes (Signed)
Patient BO:[id number] [name], female DOB:1951-02-14, 68 y.o. ENI:[id number]  Chief Complaint  Patient presents with  . Shoulder Pain    HPI  [name] is a 68 y.o. female who has continued pain of the left shoulder.  She works part time as a Marine scientist. I have gone over precautions for her. She has more pain with overhead use or lifting heavy objects.  She has no new trauma, no numbness.  She would like to go to PT.  I will arrange this.  She would like to return as needed.   Body mass index is 47.38 kg/m.  The patient meets the AMA guidelines for Morbid (severe) obesity with a BMI > 40.0 and I have recommended weight loss.   ROS  Review of Systems  Constitutional: Positive for activity change.  Musculoskeletal: Positive for arthralgias.  Psychiatric/Behavioral: The patient is nervous/anxious.   All other systems reviewed and are negative.   All other systems reviewed and are negative.  The following is a summary of the past history medically, past history surgically, known current medicines, social history and family history.  This information is gathered electronically by the computer from prior information and documentation.  I review this each visit and have found including this information at this point in the chart is beneficial and informative.    Past Medical History:  Diagnosis Date  . Anxiety   . Bipolar 1 disorder ([place])   . 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 Breast cancer, left breast ([place]) 07/22/2011  . Cancer [facility]) breast cancer 7 yrs ago  . Depression   . High cholesterol   . Hypertension   . IBS (irritable bowel syndrome)   . Impaired fasting glucose   . Memory loss   . OA (osteoarthritis)   . Obesity   . Personal history of chemotherapy 2009  . Personal history of radiation therapy 2009  . Polycystic ovarian disease   . 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 Sleep apnea     Past Surgical  History:  Procedure Laterality Date  . ABDOMINAL HYSTERECTOMY    . APPENDECTOMY    . BREAST BIOPSY Left 2011  . BREAST BIOPSY  2008  . BREAST LUMPECTOMY Left 2008  . CHOLECYSTECTOMY    . COLONOSCOPY N/A 09/13/2013   Procedure: COLONOSCOPY;  Surgeon: [name], MD;  [facility];  Service: Endoscopy;  Laterality: N/A;  730  . EP IMPLANTABLE DEVICE N/A 01/06/2016   Procedure: Pacemaker Implant;  Surgeon: [name], MD;  Location: [place] [facility] LAB;  Service: Cardiovascular;  Laterality: N/A;  . ESOPHAGOGASTRODUODENOSCOPY N/A 01/17/2014   Procedure: ESOPHAGOGASTRODUODENOSCOPY (EGD);  Surgeon: [name], MD;  [facility];  Service: Endoscopy;  Laterality: N/A;  245  . TOTAL KNEE ARTHROPLASTY Bilateral right knee   2012, 2007    Family History  Problem Relation Age of Onset  . Bipolar disorder Mother   . Diabetes Mother   . Bipolar disorder Sister   . Diabetes Sister   . Alcohol abuse Father   . Hypertension Father   . Heart disease Other     Social History Social History   Tobacco Use  . Smoking status: Never Smoker  . Smokeless tobacco: Never Used  Substance Use Topics  . Alcohol use: No    Alcohol/week: 0.0 standard drinks  . Drug use: No    Allergies  Allergen Reactions  . Imipramine Hives and  Rash  . Tricor [Fenofibrate] Other (See Comments)    Pain, "aching"    Current Outpatient Medications  Medication Sig Dispense Refill  . acetaminophen (TYLENOL 8 HOUR) 650 MG CR tablet Take 650 mg by mouth every morning. Two Q am and one Qhs    . ALPRAZolam (XANAX XR) 1 MG 24 hr tablet Take 1 mg by mouth daily.     [name] ALPRAZolam (XANAX) 1 MG tablet Take 0.5 mg by mouth as directed. Takes with xanax XR 1mg  one bid    . glucosamine-chondroitin 500-400 MG tablet Take 2 tablets by mouth daily.    [name] lamoTRIgine (LAMICTAL) 200 MG tablet Take 400 mg by mouth at bedtime.     . Multiple Vitamins-Minerals (OCUVITE PO) Take by mouth daily.    .  QUEtiapine (SEROQUEL) 200 MG tablet Take 400 mg by mouth at bedtime.     . Vitamin D, Ergocalciferol, (DRISDOL) 50000 units CAPS capsule Take 50,000 Units by mouth every 7 (seven) days.     No current facility-administered medications for this visit.      Physical Exam  Blood pressure 132/79, pulse 72, height 5\' 4"  (1.626 m), weight 276 lb (125.2 kg).  Constitutional: overall normal hygiene, normal nutrition, well developed, normal grooming, normal body habitus. Assistive device:none  Musculoskeletal: gait and station Limp none, muscle tone and strength are normal, no tremors or atrophy is present.  .  Neurological: coordination overall normal.  Deep tendon reflex/nerve stretch intact.  Sensation normal.  Cranial nerves II-XII intact.   Skin:   Normal overall no scars, lesions, ulcers or rashes. No psoriasis.  Psychiatric: Alert and oriented x 3.  Recent memory intact, remote memory unclear.  Normal mood and affect. Well groomed.  Good eye contact.  Cardiovascular: overall no swelling, no varicosities, no edema bilaterally, normal temperatures of the legs and arms, no clubbing, cyanosis and good capillary refill.  Lymphatic: palpation is normal.  Left shoulder has full motion but painful in the extremes.  NV intact.  All other systems reviewed and are negative   The patient has been educated about the nature of the problem(s) and counseled on treatment options.  The patient appeared to understand what I have discussed and is in agreement with it.  Encounter Diagnoses  Name Primary?  . Pain in joint of left shoulder Yes  . Body mass index 45.0-49.9, adult ([place])   . Morbid obesity ([place])     PLAN Call if any problems.  Precautions discussed.  Continue current medications.   Return to clinic prn per her request.   Begin OT/PT.  Electronically Signed Sanjuana Kava, MD 2/26/202010:15 AM

## 2018-09-06 NOTE — Patient Instructions (Signed)
..  Occupational therapy has been ordered for you at [facility] [phone] is the phone number to call if you want to call to schedule. Please let us know if you do not hear anything within one week.

## 2018-09-13 ENCOUNTER — Other Ambulatory Visit: Payer: Self-pay

## 2018-09-13 ENCOUNTER — Ambulatory Visit (HOSPITAL_COMMUNITY): Payer: PPO | Attending: Orthopaedic Surgery | Admitting: Occupational Therapy

## 2018-09-13 ENCOUNTER — Encounter (HOSPITAL_COMMUNITY): Payer: Self-pay | Admitting: Occupational Therapy

## 2018-09-13 DIAGNOSIS — R29898 Other symptoms and signs involving the musculoskeletal system: Secondary | ICD-10-CM | POA: Insufficient documentation

## 2018-09-13 DIAGNOSIS — M25512 Pain in left shoulder: Secondary | ICD-10-CM | POA: Diagnosis not present

## 2018-09-13 NOTE — Patient Instructions (Signed)

## 2018-09-13 NOTE — Therapy (Signed)
[facility] [address] [place], [place], [address] Phone: [phone]   Fax:  [phone]  Occupational Therapy Evaluation  Patient Details  Name: [name] MRN: [id number] Date of Birth: [birth year] Referring Provider (OT): Dr. Sanjuana Kava   Encounter Date: 09/13/2018  OT End of Session - 09/13/18 1340    Visit Number  1    Number of Visits  8    Date for OT Re-Evaluation  10/13/18    Authorization Type  [employer]    Authorization Time Period  $15 copay    OT Start Time  1304    OT Stop Time  1335    OT Time Calculation (min)  31 min    Activity Tolerance  Patient tolerated treatment well    Behavior During Therapy  [facility] for tasks assessed/performed       Past Medical History:  Diagnosis Date  . Anxiety   . Bipolar 1 disorder ([place])   . 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 Breast cancer, left breast ([place]) 07/22/2011  . Cancer [facility]) breast cancer 7 yrs ago  . Depression   . High cholesterol   . Hypertension   . IBS (irritable bowel syndrome)   . Impaired fasting glucose   . Memory loss   . OA (osteoarthritis)   . Obesity   . Personal history of chemotherapy 2009  . Personal history of radiation therapy 2009  . Polycystic ovarian disease   . 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 Sleep apnea     Past Surgical History:  Procedure Laterality Date  . ABDOMINAL HYSTERECTOMY    . APPENDECTOMY    . BREAST BIOPSY Left 2011  . BREAST BIOPSY  2008  . BREAST LUMPECTOMY Left 2008  . CHOLECYSTECTOMY    . COLONOSCOPY N/A 09/13/2013   Procedure: COLONOSCOPY;  Surgeon: [name], MD;  [facility];  Service: Endoscopy;  Laterality: N/A;  730  . EP IMPLANTABLE DEVICE N/A 01/06/2016   Procedure: Pacemaker Implant;  Surgeon: [name], MD;  Location: [place] CV LAB;  Service: Cardiovascular;  Laterality: N/A;  .  ESOPHAGOGASTRODUODENOSCOPY N/A 01/17/2014   Procedure: ESOPHAGOGASTRODUODENOSCOPY (EGD);  Surgeon: [name], MD;  [facility];  Service: Endoscopy;  Laterality: N/A;  245  . TOTAL KNEE ARTHROPLASTY Bilateral right knee   2012, 2007    There were no vitals filed for this visit.  Subjective Assessment - 09/13/18 1343    Subjective   S: I think it's getting better but I still have catching and discomfort.     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 Pt has been referred to occupational therapy for evaluation and treatment by Dr. Sanjuana Kava.     Special Tests  FOTO: 60.5/100    Patient Stated Goals  To be able to use my arm without pain     Currently in Pain?  No/denies        [facility] OT Assessment - 09/13/18 1301      Assessment   Medical Diagnosis  left shoulder pain    Referring Provider (OT)  Dr. Sanjuana Kava    Onset Date/Surgical Date  06/20/18    Hand Dominance  Right    Next MD Visit  None scheduled    Prior Therapy  None      Precautions   Precautions  None      Restrictions   Weight Bearing Restrictions  No      Balance Screen   Has the patient fallen in the past 6 months  No    Has the patient had a decrease in activity level because of a fear of falling?   No    Is the patient reluctant to leave their home because of a fear of falling?   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#    Leisure  going to [employer]      ADL   ADL comments  Pt is having difficulty with sleeping, lifting heavy objects, often catching sensation with reaching tasks      Written Expression   Dominant Hand  Right      Cognition   Overall Cognitive Status  Within Functional Limits for tasks assessed      Observation/Other  Assessments   Focus on Therapeutic Outcomes (FOTO)   60.5/100      ROM / Strength   AROM / PROM / Strength  AROM;PROM;Strength      Palpation   Palpation comment  moderate fascial restrictions along right upper arm and deltoid regions      AROM   Overall AROM Comments  Assessed seated, er/IR adducted    AROM Assessment Site  Shoulder    Right/Left Shoulder  Left    Left Shoulder Flexion  125 Degrees    Left Shoulder ABduction  130 Degrees    Left Shoulder Internal Rotation  90 Degrees    Left Shoulder External Rotation  58 Degrees      PROM   Overall PROM Comments  Assessed supine, er/IR adducted    PROM Assessment Site  Shoulder    Right/Left Shoulder  Left    Left Shoulder Flexion  180 Degrees    Left Shoulder ABduction  180 Degrees    Left Shoulder Internal Rotation  90 Degrees    Left Shoulder External Rotation  75 Degrees      Strength   Overall Strength Comments  Assessed seated, er/IR adducted    Strength Assessment Site  Shoulder    Right/Left Shoulder  Left    Left Shoulder Flexion  4/5    Left Shoulder ABduction  3/5    Left Shoulder Internal Rotation  4+/5    Left Shoulder External Rotation  4+/5                      OT Education - 09/13/18 1328    Education Details  AA/ROM     Person(s) Educated  Patient    Methods  Explanation;Demonstration    Comprehension  Verbalized understanding;Returned demonstration       OT Short Term Goals - 09/13/18 1345      OT SHORT TERM GOAL #1   Title  Pt will be provided with and educated on HEP to improve mobility in LUE required for ADL completion.     Time  4    Period  Weeks    Status  New    Target Date  10/13/18      OT SHORT TERM GOAL #2   Title  Pt will decrease pain in LUE to 3/10 or less to improve ability  to sleep in a position of comfort at night.     Time  4    Period  Weeks    Status  New      OT SHORT TERM GOAL #3   Title  Pt will decrease LUE fascial restrictions to minimal amounts  or less to improve mobility required for functional reaching tasks.     Time  4    Period  Weeks    Status  New      OT SHORT TERM GOAL #4   Title  Pt will increase LUE A/ROM to [facility] to improve ability to reach overhead and behind back during ADL completion.     Time  4    Period  Weeks    Status  New      OT SHORT TERM GOAL #5   Title  Pt will improve strength in LUE to 4+/5 or greater to increase ability to perform lifting tasks required for jobs.     Time  4    Period  Weeks    Status  New               Plan - 09/13/18 1340    Clinical Impression Statement  A: Pt is a 68 y/o female presenting with left shoulder pain present for approximately 3 months affecting ability to perform ADLs and work tasks using LUE as assist. Pt had a cortisone shot on 08/09/2018 however does not believe it helped.     OT Occupational Profile and History  Problem Focused Assessment - Including review of records relating to presenting problem    Occupational performance deficits (Please refer to evaluation for details):  ADL's;IADL's;Rest and Sleep;Work;Leisure    Body Structure / Function / Physical Skills  ADL;Strength;Pain;UE functional use;ROM;IADL;Fascial restriction;Flexibility    Rehab Potential  Good    Clinical Decision Making  Limited treatment options, no task modification necessary    Comorbidities Affecting Occupational Performance:  None    Modification or Assistance to Complete Evaluation   No modification of tasks or assist necessary to complete eval    OT Frequency  2x / week    OT Duration  4 weeks    OT Treatment/Interventions  Self-care/ADL training;Moist Heat;Therapeutic activities;Ultrasound;Therapeutic exercise;Cryotherapy;Passive range of motion;Electrical Stimulation;Manual Therapy;Patient/family education    Plan  P: Pt will benefit from skilled OT services to decrease pain and fascial restrictions, increase joint ROM, strength, and functional task completion using LUE as  non-dominant. Treatment plan: myofascial release, manual techniques, P/ROM, AA/ROM, A/ROM, general LUE strengthening, scapular stability and strengthening, modalities prn    Consulted and Agree with Plan of Care  Patient       Patient will benefit from skilled therapeutic intervention in order to improve the following deficits and impairments:  Body Structure / Function / Physical Skills  Visit Diagnosis: Acute pain of left shoulder  Other symptoms and signs involving the musculoskeletal system    Problem List Patient Active Problem List   Diagnosis Date Noted  . Memory loss 12/12/2017  . Morbid obesity ([place]) 09/05/2017  . Sinus node dysfunction ([place]) 01/06/2016  . Bradycardia 10/22/2015  . Bipolar disorder ([place]) 10/22/2015  . Anxiety 10/22/2015  . Pre-syncope 10/22/2015  . Episodic lightheadedness   . GERD (gastroesophageal reflux disease) 01/08/2014  . Hypertension 11/28/2013  . Palpitations 11/28/2013  . Osteoarthritis 11/08/2013  . Lumbar back pain 11/08/2013  . Hypertriglyceridemia 08/06/2013  . Depression 04/20/2013  . Breast cancer, left breast ([place]) 07/22/2011   Guadelupe Sabin,  OTR/L  [phone] 09/13/2018, 1:48 PM  [facility] [place], [place], [address] Phone: [phone]   Fax:  [phone]  Name: KEYLEE SHRESTHA MRN: [id number] Date of Birth: 1951/02/28

## 2018-09-14 ENCOUNTER — Other Ambulatory Visit: Payer: Self-pay

## 2018-09-14 ENCOUNTER — Emergency Department (HOSPITAL_COMMUNITY)
Admission: EM | Admit: 2018-09-14 | Discharge: 2018-09-14 | Disposition: A | Discharge status: Home / Self Care | Payer: PPO | Attending: Emergency Medicine | Admitting: Emergency Medicine

## 2018-09-14 ENCOUNTER — Emergency Department (HOSPITAL_COMMUNITY): Payer: PPO

## 2018-09-14 ENCOUNTER — Encounter (HOSPITAL_COMMUNITY): Payer: Self-pay | Admitting: Emergency Medicine

## 2018-09-14 DIAGNOSIS — M546 Pain in thoracic spine: Secondary | ICD-10-CM | POA: Diagnosis not present

## 2018-09-14 DIAGNOSIS — R079 Chest pain, unspecified: Secondary | ICD-10-CM | POA: Diagnosis not present

## 2018-09-14 DIAGNOSIS — I10 Essential (primary) hypertension: Secondary | ICD-10-CM | POA: Insufficient documentation

## 2018-09-14 DIAGNOSIS — Z96653 Presence of artificial knee joint, bilateral: Secondary | ICD-10-CM | POA: Insufficient documentation

## 2018-09-14 DIAGNOSIS — R0789 Other chest pain: Secondary | ICD-10-CM

## 2018-09-14 DIAGNOSIS — Z950 Presence of cardiac pacemaker: Secondary | ICD-10-CM | POA: Insufficient documentation

## 2018-09-14 DIAGNOSIS — Z79899 Other long term (current) drug therapy: Secondary | ICD-10-CM | POA: Insufficient documentation

## 2018-09-14 LAB — HEPATIC FUNCTION PANEL
ALT: 23 U/L (ref 0–44)
AST: 26 U/L (ref 15–41)
Albumin: 4.7 g/dL (ref 3.5–5.0)
Alkaline Phosphatase: 96 U/L (ref 38–126)
BILIRUBIN INDIRECT: 0.4 mg/dL (ref 0.3–0.9)
Bilirubin, Direct: 0.1 mg/dL (ref 0.0–0.2)
Total Bilirubin: 0.5 mg/dL (ref 0.3–1.2)
Total Protein: 7.7 g/dL (ref 6.5–8.1)

## 2018-09-14 LAB — CBC
HEMATOCRIT: 45.8 % (ref 36.0–46.0)
Hemoglobin: 14.2 g/dL (ref 12.0–15.0)
MCH: 31.1 pg (ref 26.0–34.0)
MCHC: 31 g/dL (ref 30.0–36.0)
MCV: 100.2 fL — AB (ref 80.0–100.0)
NRBC: 0 % (ref 0.0–0.2)
Platelets: 265 10*3/uL (ref 150–400)
RBC: 4.57 MIL/uL (ref 3.87–5.11)
RDW: 13.1 % (ref 11.5–15.5)
WBC: 5 10*3/uL (ref 4.0–10.5)

## 2018-09-14 LAB — BASIC METABOLIC PANEL
Anion gap: 8 (ref 5–15)
BUN: 20 mg/dL (ref 8–23)
CHLORIDE: 104 mmol/L (ref 98–111)
CO2: 28 mmol/L (ref 22–32)
CREATININE: 0.77 mg/dL (ref 0.44–1.00)
Calcium: 10 mg/dL (ref 8.9–10.3)
GFR calc non Af Amer: >60 mL/min (ref >60)
Glucose, Bld: 108 mg/dL — ABNORMAL HIGH (ref 70–99)
Potassium: 4.8 mmol/L (ref 3.5–5.1)
Sodium: 140 mmol/L (ref 135–145)

## 2018-09-14 LAB — TROPONIN I
Troponin I: <0.03 ng/mL (ref <0.03)
Troponin I: <0.03 ng/mL (ref <0.03)

## 2018-09-14 LAB — LIPASE, BLOOD: Lipase: 19 U/L (ref 11–51)

## 2018-09-14 LAB — D-DIMER, QUANTITATIVE: D-Dimer, Quant: 0.28 ug/mL-FEU (ref 0.00–0.50)

## 2018-09-14 MED ORDER — ONDANSETRON 4 MG PO TBDP
4.0000 mg | ORAL_TABLET | Freq: Once | ORAL | Status: AC
  Administered 2018-09-14: 4 mg via ORAL
  Filled 2018-09-14: qty 1

## 2018-09-14 MED ORDER — ALUM & MAG HYDROXIDE-SIMETH 200-200-20 MG/5ML PO SUSP
15.0000 mL | Freq: Once | ORAL | Status: AC
  Administered 2018-09-14: 15 mL via ORAL
  Filled 2018-09-14: qty 30

## 2018-09-14 MED ORDER — METHOCARBAMOL 500 MG PO TABS: 500.0000 mg | ORAL_TABLET | Freq: Three times a day (TID) | ORAL | 0 refills | Status: DC | PRN

## 2018-09-14 MED ORDER — ACETAMINOPHEN 325 MG PO TABS
650.0000 mg | ORAL_TABLET | ORAL | Status: DC | PRN
  Administered 2018-09-14: 650 mg via ORAL
  Filled 2018-09-14: qty 2

## 2018-09-14 NOTE — ED Triage Notes (Signed)
Pt reports intermittent chest pain that goes to her back and LT shoulder x 3 days. Pt also endorses mild nausea and weakness.

## 2018-09-14 NOTE — ED Provider Notes (Signed)
[facility] EMERGENCY DEPARTMENT Provider Note   CSN: [id number] Arrival date & time: 09/14/18  1139    History   Chief Complaint Chief Complaint  Patient presents with  . Chest Pain    HPI [name] is a 68 y.o. female.     HPI Patient presents with left-sided thoracic back pain which started yesterday.  Pain is worse with movement and palpation.  She has been taking intermittent naproxen for rotator cuff injury of her left shoulder.  Just started physical therapy for this yesterday.  Patient also complains of intermittent central chest tightness.  She is had associated nausea.  Denying abdominal pain.  No melanotic or grossly bloody stools.  No fever or chills.  No recent extended travel or immobilization.  No lower extremity swelling or pain. Past Medical History:  Diagnosis Date  . Anxiety   . Bipolar 1 disorder ([place])   . 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 Breast cancer, left breast ([place]) 07/22/2011  . Cancer [facility]) breast cancer 7 yrs ago  . Depression   . High cholesterol   . Hypertension   . IBS (irritable bowel syndrome)   . Impaired fasting glucose   . Memory loss   . OA (osteoarthritis)   . Obesity   . Personal history of chemotherapy 2009  . Personal history of radiation therapy 2009  . Polycystic ovarian disease   . 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 Sleep apnea     Patient Active Problem List   Diagnosis Date Noted  . Memory loss 12/12/2017  . Morbid obesity ([place]) 09/05/2017  . Sinus node dysfunction ([place]) 01/06/2016  . Bradycardia 10/22/2015  . Bipolar disorder ([place]) 10/22/2015  . Anxiety 10/22/2015  . Pre-syncope 10/22/2015  . Episodic lightheadedness   . GERD (gastroesophageal reflux disease) 01/08/2014  . Hypertension 11/28/2013  . Palpitations 11/28/2013  . Osteoarthritis 11/08/2013  . Lumbar back pain 11/08/2013  . Hypertriglyceridemia 08/06/2013  . Depression 04/20/2013    . Breast cancer, left breast ([place]) 07/22/2011    Past Surgical History:  Procedure Laterality Date  . ABDOMINAL HYSTERECTOMY    . APPENDECTOMY    . BREAST BIOPSY Left 2011  . BREAST BIOPSY  2008  . BREAST LUMPECTOMY Left 2008  . CHOLECYSTECTOMY    . COLONOSCOPY N/A 09/13/2013   Procedure: COLONOSCOPY;  Surgeon: [name], MD;  [facility];  Service: Endoscopy;  Laterality: N/A;  730  . EP IMPLANTABLE DEVICE N/A 01/06/2016   Procedure: Pacemaker Implant;  Surgeon: [name], MD;  Location: [place] CV LAB;  Service: Cardiovascular;  Laterality: N/A;  . ESOPHAGOGASTRODUODENOSCOPY N/A 01/17/2014   Procedure: ESOPHAGOGASTRODUODENOSCOPY (EGD);  Surgeon: [name], MD;  Location[facility] ENDO [facility];  Service: Endoscopy;  Laterality: N/A;  245  . TOTAL KNEE ARTHROPLASTY Bilateral right knee   2012, 2007     OB History   No obstetric history on file.      Home Medications    Prior to Admission medications   Medication Sig Start Date End Date Taking? Authorizing Provider  acetaminophen (TYLENOL 8 HOUR) 650 MG CR tablet Take 650-1,300 mg by mouth See admin instructions. 1300MG  IN THE MORNING AND 650MG  AT BEDTIME   Yes [provider]  ALPRAZolam (XANAX XR) 1 MG 24 hr tablet Take 1 mg by mouth every morning.    Yes [provider]  ALPRAZolam [name]) 1 MG tablet Take 0.5 mg by  mouth daily as needed for anxiety.    Yes [provider]  calcium carbonate (TUMS - DOSED IN MG ELEMENTAL CALCIUM) 500 MG chewable tablet Chew 1-3 tablets by mouth daily as needed for indigestion or heartburn.   Yes [provider]  glucosamine-chondroitin 500-400 MG tablet Take 2 tablets by mouth every morning.    Yes [provider]  lamoTRIgine (LAMICTAL) 200 MG tablet Take 400 mg by mouth at bedtime.    Yes [provider]  naproxen sodium (ALEVE) 220 MG tablet Take 220 mg by mouth daily as needed (for pain).   Yes [provider]  QUEtiapine (SEROQUEL) 200 MG tablet Take 400 mg by mouth at bedtime.  05/07/13  Yes [name], MD  Vitamin D, Ergocalciferol, (DRISDOL) 50000 units CAPS capsule Take 50,000 Units by mouth every Sunday.    Yes [provider]  methocarbamol (ROBAXIN) 500 MG tablet Take 1 tablet (500 mg total) by mouth every 8 (eight) hours as needed for muscle spasms. 09/14/18   Julianne Rice, MD    Family History Family History  Problem Relation Age of Onset  . Bipolar disorder Mother   . Diabetes Mother   . Bipolar disorder Sister   . Diabetes Sister   . Alcohol abuse Father   . Hypertension Father   . Heart disease Other     Social History Social History   Tobacco Use  . Smoking status: Never Smoker  . Smokeless tobacco: Never Used  Substance Use Topics  . Alcohol use: No    Alcohol/week: 0.0 standard drinks  . Drug use: No     Allergies   Imipramine and Tricor [fenofibrate]   Review of Systems Review of Systems  Constitutional: Negative for chills and fever.  HENT: Negative for sore throat and trouble swallowing.   Eyes: Negative for visual disturbance.  Respiratory: Positive for chest tightness. Negative for cough and shortness of breath.   Cardiovascular: Negative for chest pain and palpitations.  Gastrointestinal: Positive for nausea. Negative for abdominal pain, constipation, diarrhea and vomiting.  Genitourinary: Negative for dysuria, flank pain and frequency.  Musculoskeletal: Positive for back pain and myalgias. Negative for neck pain.  Skin: Negative for rash and wound.  Neurological: Negative for dizziness, weakness, light-headedness, numbness and headaches.  All other systems reviewed and are negative.    Physical Exam Updated Vital Signs BP (!) 142/47   Pulse 71   Temp 98.2 F (36.8 C) (Oral)   Resp (!) 24   Ht 5\' 4"  (1.626 m)   Wt 122.5 kg   SpO2 96%   BMI 46.35 kg/m   Physical Exam Vitals signs and nursing note reviewed.    Constitutional:      Appearance: Normal appearance. She is well-developed.  HENT:     Head: Normocephalic and atraumatic.     Nose: Nose normal.     Mouth/Throat:     Mouth: Mucous membranes are moist.     Pharynx: No oropharyngeal exudate or posterior oropharyngeal erythema.  Eyes:     Extraocular Movements: Extraocular movements intact.     Pupils: Pupils are equal, round, and reactive to light.  Neck:     Musculoskeletal: Normal range of motion and neck supple. No muscular tenderness.  Cardiovascular:     Rate and Rhythm: Normal rate and regular rhythm.     Heart sounds: No murmur. No friction rub. No gallop.   Pulmonary:     Effort: Pulmonary effort is normal. No respiratory distress.  Breath sounds: Normal breath sounds. No stridor. No wheezing, rhonchi or rales.  Chest:     Chest wall: No tenderness.  Abdominal:     General: Bowel sounds are normal.     Palpations: Abdomen is soft.     Tenderness: There is no abdominal tenderness. There is no right CVA tenderness, left CVA tenderness, guarding or rebound.  Musculoskeletal: Normal range of motion.        General: Tenderness present. No swelling, deformity or signs of injury.     Right lower leg: No edema.     Left lower leg: No edema.     Comments: Patient has midline thoracic lumbar and left paraspinal thoracic muscular tenderness to palpation.  No step-offs or deformities.  No lower extremity swelling or pain.  Distal pulses intact.  Skin:    General: Skin is warm and dry.     Findings: No erythema or rash.  Neurological:     General: No focal deficit present.     Mental Status: She is alert and oriented to person, place, and time.     Comments: 5/5 motor in all extremities.  Sensation intact.  Psychiatric:        Behavior: Behavior normal.     Comments: Anxious appearing      ED Treatments / Results  Labs (all labs ordered are listed, but only abnormal results are displayed) Labs Reviewed  BASIC METABOLIC  PANEL - Abnormal; Notable for the following components:      Result Value   Glucose, Bld 108 (*)    All other components within normal limits  CBC - Abnormal; Notable for the following components:   MCV 100.2 (*)    All other components within normal limits  TROPONIN I  HEPATIC FUNCTION PANEL  LIPASE, BLOOD  TROPONIN I  D-DIMER, QUANTITATIVE (NOT AT [facility])    EKG EKG Interpretation  Date/Time:  Thursday September 14 2018 11:48:42 EST Ventricular Rate:  73 PR Interval:  198 QRS Duration: 90 QT Interval:  388 QTC Calculation: 427 R Axis:   -35 Text Interpretation:  Normal sinus rhythm Left axis deviation Moderate voltage criteria for LVH, may be normal variant Abnormal ECG Confirmed by Julianne Rice [phone]) on 09/14/2018 3:40:22 PM   Radiology Dg Chest 2 View  Result Date: 09/14/2018 CLINICAL DATA:  Chest pain for 3 days. EXAM: CHEST - 2 VIEW COMPARISON:  02/02/2016 FINDINGS: Stable position of dual lead cardiac pacemaker/defibrillator. Cardiomediastinal silhouette is normal. Mediastinal contours appear intact. There is no evidence of focal airspace consolidation, pleural effusion or pneumothorax. Osseous structures are without acute abnormality. 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 Soft tissues are grossly normal. IMPRESSION: No active cardiopulmonary disease. Electronically Signed   By: Fidela Salisbury M.D.   On: 09/14/2018 12:46    Procedures Procedures (including critical care time)  Medications Ordered in ED Medications  acetaminophen (TYLENOL) tablet 650 mg (650 mg Oral Given 09/14/18 1604)  ondansetron (ZOFRAN-ODT) disintegrating tablet 4 mg (4 mg Oral Given 09/14/18 1604)  alum & mag hydroxide-simeth (MAALOX/MYLANTA) 200-200-20 MG/5ML suspension 15 mL (15 mLs Oral Given 09/14/18 1604)     Initial Impression / Assessment and Plan / ED Course  I have reviewed the triage vital signs and the nursing notes.  Pertinent labs & imaging  results that were available during my care of the patient were reviewed by me and considered in my medical decision making (see chart for details).        Patient  symptoms likely musculoskeletal.  Hyperostosis seen on x-ray.  Thoracic symptoms are reproduced with palpation.  Chest tightness is nonspecific.  EKG without changes and troponin x2 is normal.  Will discharge home with symptomatic control.  Given patient does have some risk factors for coronary artery disease she is advised to follow-up with a cardiologist.  Final Clinical Impressions(s) / ED Diagnoses   Final diagnoses:  Acute left-sided thoracic back pain  Atypical chest pain    ED Discharge Orders         Ordered    methocarbamol (ROBAXIN) 500 MG tablet  Every 8 hours PRN     09/14/18 1821           Julianne Rice, MD 09/14/18 1823

## 2018-09-20 ENCOUNTER — Ambulatory Visit (HOSPITAL_COMMUNITY): Payer: PPO | Admitting: Occupational Therapy

## 2018-09-20 ENCOUNTER — Other Ambulatory Visit: Payer: Self-pay

## 2018-09-20 ENCOUNTER — Encounter (HOSPITAL_COMMUNITY): Payer: Self-pay | Admitting: Occupational Therapy

## 2018-09-20 DIAGNOSIS — M25512 Pain in left shoulder: Secondary | ICD-10-CM | POA: Diagnosis not present

## 2018-09-20 DIAGNOSIS — R29898 Other symptoms and signs involving the musculoskeletal system: Secondary | ICD-10-CM

## 2018-09-20 NOTE — Therapy (Signed)
[facility] [address] [place], [place], [address] Phone: [phone]   Fax:  [phone]  Occupational Therapy Treatment  Patient Details  Name: [name] MRN: [id number] Date of Birth: May 04, 1951 Referring Provider (OT): Dr. Sanjuana Kava   Encounter Date: 09/20/2018  OT End of Session - 09/20/18 1513    Visit Number  2    Number of Visits  8    Date for OT Re-Evaluation  10/13/18    Authorization Type  [employer]    Authorization Time Period  $15 copay    OT Start Time  1430    OT Stop Time  1512    OT Time Calculation (min)  42 min    Activity Tolerance  Patient tolerated treatment well    Behavior During Therapy  [facility] for tasks assessed/performed       Past Medical History:  Diagnosis Date  . Anxiety   . Bipolar 1 disorder ([place])   . 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 Breast cancer, left breast ([place]) 07/22/2011  . Cancer [facility]) breast cancer 7 yrs ago  . Depression   . High cholesterol   . Hypertension   . IBS (irritable bowel syndrome)   . Impaired fasting glucose   . Memory loss   . OA (osteoarthritis)   . Obesity   . Personal history of chemotherapy 2009  . Personal history of radiation therapy 2009  . Polycystic ovarian disease   . 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 Sleep apnea     Past Surgical History:  Procedure Laterality Date  . ABDOMINAL HYSTERECTOMY    . APPENDECTOMY    . BREAST BIOPSY Left 2011  . BREAST BIOPSY  2008  . BREAST LUMPECTOMY Left 2008  . CHOLECYSTECTOMY    . COLONOSCOPY N/A 09/13/2013   Procedure: COLONOSCOPY;  Surgeon: [name], MD;  [facility];  Service: Endoscopy;  Laterality: N/A;  730  . EP IMPLANTABLE DEVICE N/A 01/06/2016   Procedure: Pacemaker Implant;  Surgeon: [name], MD;  Location: [place] CV LAB;  Service: Cardiovascular;  Laterality: N/A;  .  ESOPHAGOGASTRODUODENOSCOPY N/A 01/17/2014   Procedure: ESOPHAGOGASTRODUODENOSCOPY (EGD);  Surgeon: [name], MD;  Location[facility];  Service: Endoscopy;  Laterality: N/A;  245  . TOTAL KNEE ARTHROPLASTY Bilateral right knee   2012, 2007    There were no vitals filed for this visit.  Subjective Assessment - 09/20/18 1429    Subjective   S: I tolerated the execises ok.     Currently in Pain?  No/denies         [facility] OT Assessment - 09/20/18 1429      Assessment   Medical Diagnosis  left shoulder pain      Precautions   Precautions  None               OT Treatments/Exercises (OP) - 09/20/18 1429      Exercises   Exercises  Shoulder      Shoulder Exercises: Supine   Protraction  PROM;5 reps;AROM;10 reps    Horizontal ABduction  PROM;5 reps;AROM;10 reps    External Rotation  PROM;5 reps;AROM;10 reps    Internal Rotation  PROM;5 reps;AROM;10 reps    Flexion  PROM;5 reps;AROM;10 reps    ABduction  PROM;5 reps;AROM;10 reps      Shoulder Exercises: Seated   Protraction  AROM;10 reps  Horizontal ABduction  AROM;10 reps    External Rotation  AROM;10 reps    Internal Rotation  AROM;10 reps    Flexion  AROM;10 reps    Abduction  AAROM;10 reps      Shoulder Exercises: Standing   Other Standing Exercises  PVC pipe slide, 10X flexion      Shoulder Exercises: ROM/Strengthening   Wall Wash  1'      Manual Therapy   Manual Therapy  Myofascial release    Manual therapy comments  All manual techniques completed separately from therapeutic exercises    Myofascial Release  myofascial release to left upper arm, anterior deltoid, and trapezius regions to decrease pain and fascial restrictions and increase joint ROM               OT Short Term Goals - 09/20/18 1446      OT SHORT TERM GOAL #1   Title  Pt will be provided with and educated on HEP to improve mobility in LUE required for ADL completion.     Time  4    Period  Weeks    Status  On-going     Target Date  10/13/18      OT SHORT TERM GOAL #2   Title  Pt will decrease pain in LUE to 3/10 or less to improve ability to sleep in a position of comfort at night.     Time  4    Period  Weeks    Status  On-going      OT SHORT TERM GOAL #3   Title  Pt will decrease LUE fascial restrictions to minimal amounts or less to improve mobility required for functional reaching tasks.     Time  4    Period  Weeks    Status  On-going      OT SHORT TERM GOAL #4   Title  Pt will increase LUE A/ROM to [facility] to improve ability to reach overhead and behind back during ADL completion.     Time  4    Period  Weeks    Status  On-going      OT SHORT TERM GOAL #5   Title  Pt will improve strength in LUE to 4+/5 or greater to increase ability to perform lifting tasks required for jobs.     Time  4    Period  Weeks    Status  On-going               Plan - 09/20/18 1514    Clinical Impression Statement  A: Initiated myofascial release and manual techniques to left upper arm and trapezius regions to address fascial restrictions. Pt completing A/ROM today, ROM is good however strength limiting range achieved. Also completing wall wash and PVC pipe slide. Verbal cuing for form and technique.     Body Structure / Function / Physical Skills  ADL;Strength;Pain;UE functional use;ROM;IADL;Fascial restriction;Flexibility    Plan  P: Continue with A/ROM, Add prot/ret/elev/dep       Patient will benefit from skilled therapeutic intervention in order to improve the following deficits and impairments:  Body Structure / Function / Physical Skills  Visit Diagnosis: Acute pain of left shoulder  Other symptoms and signs involving the musculoskeletal system    Problem List Patient Active Problem List   Diagnosis Date Noted  . Memory loss 12/12/2017  . Morbid obesity ([place]) 09/05/2017  . Sinus node dysfunction ([place]) 01/06/2016  . Bradycardia 10/22/2015  . Bipolar disorder ([place])  10/22/2015  .  Anxiety 10/22/2015  . Pre-syncope 10/22/2015  . Episodic lightheadedness   . GERD (gastroesophageal reflux disease) 01/08/2014  . Hypertension 11/28/2013  . Palpitations 11/28/2013  . Osteoarthritis 11/08/2013  . Lumbar back pain 11/08/2013  . Hypertriglyceridemia 08/06/2013  . Depression 04/20/2013  . Breast cancer, left breast [facility]) 07/22/2011   Guadelupe Sabin, OTR/L  [phone] 09/20/2018, 3:46 PM  [facility] [address] [place], [place], [address] Phone: [phone]   Fax:  [phone]  Name: [name] MRN: [id number] Date of Birth: May 29, 1951

## 2018-09-22 ENCOUNTER — Other Ambulatory Visit: Payer: Self-pay

## 2018-09-22 ENCOUNTER — Encounter (HOSPITAL_COMMUNITY): Payer: Self-pay

## 2018-09-22 ENCOUNTER — Ambulatory Visit (HOSPITAL_COMMUNITY): Payer: PPO

## 2018-09-22 DIAGNOSIS — R29898 Other symptoms and signs involving the musculoskeletal system: Secondary | ICD-10-CM

## 2018-09-22 DIAGNOSIS — M25512 Pain in left shoulder: Secondary | ICD-10-CM

## 2018-09-22 NOTE — Therapy (Signed)
[facility] [address] [place], [place], [address] Phone: [phone]   Fax:  [phone]  Occupational Therapy Treatment  Patient Details  Name: [name] MRN: [id number] Date of Birth: [birth year] Referring Provider (OT): Dr. Sanjuana Kava   Encounter Date: 09/22/2018  OT End of Session - 09/22/18 1322    Visit Number  3    Number of Visits  8    Date for OT Re-Evaluation  10/13/18    Authorization Type  [employer]    Authorization Time Period  $15 copay    OT Start Time  1207    OT Stop Time  1245    OT Time Calculation (min)  38 min    Activity Tolerance  Patient tolerated treatment well    Behavior During Therapy  [facility] for tasks assessed/performed       Past Medical History:  Diagnosis Date  . Anxiety   . Bipolar 1 disorder ([place])   . 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 Breast cancer, left breast ([place]) 07/22/2011  . Cancer [facility]) breast cancer 7 yrs ago  . Depression   . High cholesterol   . Hypertension   . IBS (irritable bowel syndrome)   . Impaired fasting glucose   . Memory loss   . OA (osteoarthritis)   . Obesity   . Personal history of chemotherapy 2009  . Personal history of radiation therapy 2009  . Polycystic ovarian disease   . 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 Sleep apnea     Past Surgical History:  Procedure Laterality Date  . ABDOMINAL HYSTERECTOMY    . APPENDECTOMY    . BREAST BIOPSY Left 2011  . BREAST BIOPSY  2008  . BREAST LUMPECTOMY Left 2008  . CHOLECYSTECTOMY    . COLONOSCOPY N/A 09/13/2013   Procedure: COLONOSCOPY;  Surgeon: [name], MD;  [facility];  Service: Endoscopy;  Laterality: N/A;  730  . EP IMPLANTABLE DEVICE N/A 01/06/2016   Procedure: Pacemaker Implant;  Surgeon: [name], MD;  Location: [place] CV LAB;  Service: Cardiovascular;  Laterality: N/A;  .  ESOPHAGOGASTRODUODENOSCOPY N/A 01/17/2014   Procedure: ESOPHAGOGASTRODUODENOSCOPY (EGD);  Surgeon: [name], MD;  [facility];  Service: Endoscopy;  Laterality: N/A;  245  . TOTAL KNEE ARTHROPLASTY Bilateral right knee   2012, 2007    There were no vitals filed for this visit.  Subjective Assessment - 09/22/18 1225    Subjective   S: I hurts when it goes that far (passive flexion).    Currently in Pain?  No/denies         [facility] OT Assessment - 09/22/18 0001      Assessment   Medical Diagnosis  left shoulder pain      Precautions   Precautions  None               OT Treatments/Exercises (OP) - 09/22/18 1226      Exercises   Exercises  Shoulder      Shoulder Exercises: Supine   Protraction  PROM;5 reps;AROM;10 reps    Horizontal ABduction  PROM;5 reps;AROM;10 reps    External Rotation  PROM;5 reps;AROM;10 reps    Internal Rotation  PROM;5 reps;AROM;10 reps    Flexion  PROM;5 reps;AROM;10 reps    ABduction  PROM;5 reps;AROM;10 reps      Shoulder Exercises: Seated   Protraction  AROM;10 reps  Horizontal ABduction  AROM;10 reps    External Rotation  AROM;10 reps    Internal Rotation  AROM;10 reps    Flexion  AROM;10 reps    Abduction  AROM;10 reps    ABduction Limitations  to 90 degrees      Shoulder Exercises: Standing   Other Standing Exercises  PVC pipe slide, 10X flexion      Shoulder Exercises: ROM/Strengthening   Wall Wash  1'    Prot/Ret//Elev/Dep  1'      Manual Therapy   Manual Therapy  Myofascial release    Manual therapy comments  All manual techniques completed separately from therapeutic exercises    Myofascial Release  myofascial release to left upper arm, anterior deltoid, and trapezius regions to decrease pain and fascial restrictions and increase joint ROM               OT Short Term Goals - 09/20/18 1446      OT SHORT TERM GOAL #1   Title  Pt will be provided with and educated on HEP to improve mobility in  LUE required for ADL completion.     Time  4    Period  Weeks    Status  On-going    Target Date  10/13/18      OT SHORT TERM GOAL #2   Title  Pt will decrease pain in LUE to 3/10 or less to improve ability to sleep in a position of comfort at night.     Time  4    Period  Weeks    Status  On-going      OT SHORT TERM GOAL #3   Title  Pt will decrease LUE fascial restrictions to minimal amounts or less to improve mobility required for functional reaching tasks.     Time  4    Period  Weeks    Status  On-going      OT SHORT TERM GOAL #4   Title  Pt will increase LUE A/ROM to [facility] to improve ability to reach overhead and behind back during ADL completion.     Time  4    Period  Weeks    Status  On-going      OT SHORT TERM GOAL #5   Title  Pt will improve strength in LUE to 4+/5 or greater to increase ability to perform lifting tasks required for jobs.     Time  4    Period  Weeks    Status  On-going               Plan - 09/22/18 1322    Clinical Impression Statement  A: Added pro/ret/elev/dep this session. Focused on form and technique with VC provided. Patient reports pain at end stretch during passive stretching. Able to achive close to full P/ROM. Increased tenderness and fascial restrictions in left upper arm with manual techniques completed to address.     Body Structure / Function / Physical Skills  ADL;Strength;Pain;UE functional use;ROM;IADL;Fascial restriction;Flexibility    Plan  P: Increase repetitions for A/ROM. Add overhead lacing.    Consulted and Agree with Plan of Care  Patient       Patient will benefit from skilled therapeutic intervention in order to improve the following deficits and impairments:  Body Structure / Function / Physical Skills  Visit Diagnosis: Other symptoms and signs involving the musculoskeletal system  Acute pain of left shoulder    Problem List Patient Active Problem List   Diagnosis  Date Noted  . Memory loss 12/12/2017   . Morbid obesity ([place]) 09/05/2017  . Sinus node dysfunction ([place]) 01/06/2016  . Bradycardia 10/22/2015  . Bipolar disorder ([place]) 10/22/2015  . Anxiety 10/22/2015  . Pre-syncope 10/22/2015  . Episodic lightheadedness   . GERD (gastroesophageal reflux disease) 01/08/2014  . Hypertension 11/28/2013  . Palpitations 11/28/2013  . Osteoarthritis 11/08/2013  . Lumbar back pain 11/08/2013  . Hypertriglyceridemia 08/06/2013  . Depression 04/20/2013  . Breast cancer, left breast [facility]) 07/22/2011   Ailene Ravel, OTR/L,CBIS  [phone]  09/22/2018, 1:25 PM  [facility] [address] [place], [place], [address] Phone: [phone]   Fax:  [phone]  Name: [name] MRN: [id number] Date of Birth: [birth year]

## 2018-09-26 ENCOUNTER — Other Ambulatory Visit: Payer: Self-pay

## 2018-09-26 ENCOUNTER — Ambulatory Visit (HOSPITAL_COMMUNITY): Payer: PPO | Admitting: Occupational Therapy

## 2018-09-26 ENCOUNTER — Encounter (HOSPITAL_COMMUNITY): Payer: Self-pay | Admitting: Occupational Therapy

## 2018-09-26 DIAGNOSIS — M25512 Pain in left shoulder: Secondary | ICD-10-CM

## 2018-09-26 DIAGNOSIS — R29898 Other symptoms and signs involving the musculoskeletal system: Secondary | ICD-10-CM

## 2018-09-26 NOTE — Patient Instructions (Signed)

## 2018-09-26 NOTE — Therapy (Signed)
[facility] [address] [place], [place], [address] Phone: [phone]   Fax:  [phone]  Occupational Therapy Treatment  Patient Details  Name: [name] MRN: [id number] Date of Birth: [birth year] Referring Provider (OT): Dr. Sanjuana Kava   Encounter Date: 09/26/2018  OT End of Session - 09/26/18 1456    Visit Number  4    Number of Visits  8    Date for OT Re-Evaluation  10/13/18    Authorization Type  [employer]    Authorization Time Period  $15 copay    OT Start Time  1352    OT Stop Time  1430    OT Time Calculation (min)  38 min    Activity Tolerance  Patient tolerated treatment well    Behavior During Therapy  [facility] for tasks assessed/performed       Past Medical History:  Diagnosis Date  . Anxiety   . Bipolar 1 disorder ([place])   . 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 Breast cancer, left breast ([place]) 07/22/2011  . Cancer [facility]) breast cancer 7 yrs ago  . Depression   . High cholesterol   . Hypertension   . IBS (irritable bowel syndrome)   . Impaired fasting glucose   . Memory loss   . OA (osteoarthritis)   . Obesity   . Personal history of chemotherapy 2009  . Personal history of radiation therapy 2009  . Polycystic ovarian disease   . 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 Sleep apnea     Past Surgical History:  Procedure Laterality Date  . ABDOMINAL HYSTERECTOMY    . APPENDECTOMY    . BREAST BIOPSY Left 2011  . BREAST BIOPSY  2008  . BREAST LUMPECTOMY Left 2008  . CHOLECYSTECTOMY    . COLONOSCOPY N/A 09/13/2013   Procedure: COLONOSCOPY;  Surgeon: [name], MD;  [facility];  Service: Endoscopy;  Laterality: N/A;  730  . EP IMPLANTABLE DEVICE N/A 01/06/2016   Procedure: Pacemaker Implant;  Surgeon: [name], MD;  Location: [place] CV LAB;  Service: Cardiovascular;  Laterality: N/A;  .  ESOPHAGOGASTRODUODENOSCOPY N/A 01/17/2014   Procedure: ESOPHAGOGASTRODUODENOSCOPY (EGD);  Surgeon: [name], MD;  [facility];  Service: Endoscopy;  Laterality: N/A;  245  . TOTAL KNEE ARTHROPLASTY Bilateral right knee   2012, 2007    There were no vitals filed for this visit.  Subjective Assessment - 09/26/18 1354    Subjective   S: It's still sore when I'm trying to use it.     Currently in Pain?  No/denies         [facility] OT Assessment - 09/26/18 1354      Assessment   Medical Diagnosis  left shoulder pain      Precautions   Precautions  None               OT Treatments/Exercises (OP) - 09/26/18 1355      Exercises   Exercises  Shoulder      Shoulder Exercises: Supine   Protraction  PROM;5 reps;AROM;10 reps    Horizontal ABduction  PROM;5 reps;AROM;10 reps    External Rotation  PROM;5 reps;AROM;10 reps    Internal Rotation  PROM;5 reps;AROM;10 reps    Flexion  PROM;5 reps;AROM;10 reps    ABduction  PROM;5 reps;AROM;10 reps      Shoulder Exercises: Seated   Protraction  AROM;10  reps    Horizontal ABduction  AROM;10 reps    External Rotation  AROM;10 reps    Internal Rotation  AROM;10 reps    Flexion  AROM;10 reps    Abduction  AROM;10 reps      Shoulder Exercises: ROM/Strengthening   UBE (Upper Arm Bike)  Level 1' 2' forward 2' reverse      Manual Therapy   Manual Therapy  Myofascial release    Manual therapy comments  All manual techniques completed separately from therapeutic exercises    Myofascial Release  myofascial release to left upper arm, anterior deltoid, and trapezius regions to decrease pain and fascial restrictions and increase joint ROM             OT Education - 09/26/18 1413    Education Details  A/ROM    Person(s) Educated  Patient    Methods  Explanation;Demonstration    Comprehension  Verbalized understanding;Returned demonstration       OT Short Term Goals - 09/20/18 1446      OT SHORT TERM GOAL #1    Title  Pt will be provided with and educated on HEP to improve mobility in LUE required for ADL completion.     Time  4    Period  Weeks    Status  On-going    Target Date  10/13/18      OT SHORT TERM GOAL #2   Title  Pt will decrease pain in LUE to 3/10 or less to improve ability to sleep in a position of comfort at night.     Time  4    Period  Weeks    Status  On-going      OT SHORT TERM GOAL #3   Title  Pt will decrease LUE fascial restrictions to minimal amounts or less to improve mobility required for functional reaching tasks.     Time  4    Period  Weeks    Status  On-going      OT SHORT TERM GOAL #4   Title  Pt will increase LUE A/ROM to [facility] to improve ability to reach overhead and behind back during ADL completion.     Time  4    Period  Weeks    Status  On-going      OT SHORT TERM GOAL #5   Title  Pt will improve strength in LUE to 4+/5 or greater to increase ability to perform lifting tasks required for jobs.     Time  4    Period  Weeks    Status  On-going               Plan - 09/26/18 1415    Clinical Impression Statement  A: Manual therapy completed to left upper arm and trapezius regions to address moderate fascial restrictions in these areas. Pt only experiencing pain with flexion, mostly in lateral deltoid region. Continued with A/ROM and updated HEP, added scapular theraband this date. Pt able to complete abduction with full range today. Verbal cuing for form and technique.     Body Structure / Function / Physical Skills  ADL;Strength;Pain;UE functional use;ROM;IADL;Fascial restriction;Flexibility    Plan  P: Follow up on HEP, add overhead lacing and proximal shoulder strengthening in supine and standing       Patient will benefit from skilled therapeutic intervention in order to improve the following deficits and impairments:  Body Structure / Function / Physical Skills  Visit Diagnosis: Other symptoms and signs  involving the musculoskeletal  system  Acute pain of left shoulder    Problem List Patient Active Problem List   Diagnosis Date Noted  . Memory loss 12/12/2017  . Morbid obesity ([place]) 09/05/2017  . Sinus node dysfunction ([place]) 01/06/2016  . Bradycardia 10/22/2015  . Bipolar disorder ([place]) 10/22/2015  . Anxiety 10/22/2015  . Pre-syncope 10/22/2015  . Episodic lightheadedness   . GERD (gastroesophageal reflux disease) 01/08/2014  . Hypertension 11/28/2013  . Palpitations 11/28/2013  . Osteoarthritis 11/08/2013  . Lumbar back pain 11/08/2013  . Hypertriglyceridemia 08/06/2013  . Depression 04/20/2013  . Breast cancer, left breast [facility]) 07/22/2011   Guadelupe Sabin, OTR/L  [phone]  09/26/2018, 2:57 PM  [facility] [address] [place], [place], [address] Phone: [phone]   Fax:  [phone]  Name: [name] MRN: [id number] Date of Birth: Apr 03, 1951

## 2018-09-28 ENCOUNTER — Encounter (HOSPITAL_COMMUNITY): Payer: Self-pay | Admitting: Occupational Therapy

## 2018-09-28 ENCOUNTER — Ambulatory Visit (HOSPITAL_COMMUNITY): Payer: PPO | Admitting: Occupational Therapy

## 2018-09-28 ENCOUNTER — Other Ambulatory Visit: Payer: Self-pay

## 2018-09-28 DIAGNOSIS — M25512 Pain in left shoulder: Secondary | ICD-10-CM

## 2018-09-28 DIAGNOSIS — R29898 Other symptoms and signs involving the musculoskeletal system: Secondary | ICD-10-CM

## 2018-09-28 NOTE — Therapy (Signed)
[facility] [address] [place], [place], [address] Phone: [phone]   Fax:  [phone]  Occupational Therapy Treatment  Patient Details  Name: [name] MRN: [id number] Date of Birth: 04-05-1951 Referring Provider (OT): Dr. Sanjuana Kava   Encounter Date: 09/28/2018  OT End of Session - 09/28/18 1513    Visit Number  5    Number of Visits  8    Date for OT Re-Evaluation  10/13/18    Authorization Type  [employer]    Authorization Time Period  $15 copay    OT Start Time  1433    OT Stop Time  1512    OT Time Calculation (min)  39 min    Activity Tolerance  Patient tolerated treatment well    Behavior During Therapy  [facility] for tasks assessed/performed       Past Medical History:  Diagnosis Date  . Anxiety   . Bipolar 1 disorder ([place])   . 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 Breast cancer, left breast ([place]) 07/22/2011  . Cancer [facility]) breast cancer 7 yrs ago  . Depression   . High cholesterol   . Hypertension   . IBS (irritable bowel syndrome)   . Impaired fasting glucose   . Memory loss   . OA (osteoarthritis)   . Obesity   . Personal history of chemotherapy 2009  . Personal history of radiation therapy 2009  . Polycystic ovarian disease   . 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 Sleep apnea     Past Surgical History:  Procedure Laterality Date  . ABDOMINAL HYSTERECTOMY    . APPENDECTOMY    . BREAST BIOPSY Left 2011  . BREAST BIOPSY  2008  . BREAST LUMPECTOMY Left 2008  . CHOLECYSTECTOMY    . COLONOSCOPY N/A 09/13/2013   Procedure: COLONOSCOPY;  Surgeon: [name], MD;  [facility];  Service: Endoscopy;  Laterality: N/A;  730  . EP IMPLANTABLE DEVICE N/A 01/06/2016   Procedure: Pacemaker Implant;  Surgeon: [name], MD;  Location: [place] CV LAB;  Service: Cardiovascular;  Laterality: N/A;  .  ESOPHAGOGASTRODUODENOSCOPY N/A 01/17/2014   Procedure: ESOPHAGOGASTRODUODENOSCOPY (EGD);  Surgeon: [name], MD;  [facility];  Service: Endoscopy;  Laterality: N/A;  245  . TOTAL KNEE ARTHROPLASTY Bilateral right knee   2012, 2007    There were no vitals filed for this visit.  Subjective Assessment - 09/28/18 1431    Subjective   S: It's feeling ok today.     Currently in Pain?  No/denies         [facility] OT Assessment - 09/28/18 1431      Assessment   Medical Diagnosis  left shoulder pain      Precautions   Precautions  None               OT Treatments/Exercises (OP) - 09/28/18 1431      Exercises   Exercises  Shoulder      Shoulder Exercises: Supine   Protraction  PROM;5 reps;AROM;15 reps    Horizontal ABduction  PROM;5 reps;AROM;15 reps    External Rotation  PROM;5 reps;AROM;15 reps    Internal Rotation  PROM;5 reps;AROM;15 reps    Flexion  PROM;5 reps;AROM;15 reps    ABduction  PROM;5 reps;AROM;15 reps      Shoulder Exercises: Seated   Protraction  AROM;15 reps    Horizontal  ABduction  AROM;15 reps    External Rotation  AROM;15 reps    Internal Rotation  AROM;15 reps    Flexion  AROM;15 reps    Abduction  AROM;15 reps      Shoulder Exercises: Standing   Extension  Theraband;10 reps    Theraband Level (Shoulder Extension)  Level 2 (Red)    Row  Theraband;10 reps    Theraband Level (Shoulder Row)  Level 2 (Red)    Retraction  Theraband;10 reps    Theraband Level (Shoulder Retraction)  Level 2 (Red)      Shoulder Exercises: ROM/Strengthening   UBE (Upper Arm Bike)  Level 1' 2' forward 2' reverse    Proximal Shoulder Strengthening, Supine  10X each no rest breaks    Proximal Shoulder Strengthening, Seated  10X each no rest breaks      Manual Therapy   Manual Therapy  Myofascial release    Manual therapy comments  All manual techniques completed separately from therapeutic exercises    Myofascial Release  myofascial release to left  upper arm, anterior deltoid, and trapezius regions to decrease pain and fascial restrictions and increase joint ROM               OT Short Term Goals - 09/20/18 1446      OT SHORT TERM GOAL #1   Title  Pt will be provided with and educated on HEP to improve mobility in LUE required for ADL completion.     Time  4    Period  Weeks    Status  On-going    Target Date  10/13/18      OT SHORT TERM GOAL #2   Title  Pt will decrease pain in LUE to 3/10 or less to improve ability to sleep in a position of comfort at night.     Time  4    Period  Weeks    Status  On-going      OT SHORT TERM GOAL #3   Title  Pt will decrease LUE fascial restrictions to minimal amounts or less to improve mobility required for functional reaching tasks.     Time  4    Period  Weeks    Status  On-going      OT SHORT TERM GOAL #4   Title  Pt will increase LUE A/ROM to [facility] to improve ability to reach overhead and behind back during ADL completion.     Time  4    Period  Weeks    Status  On-going      OT SHORT TERM GOAL #5   Title  Pt will improve strength in LUE to 4+/5 or greater to increase ability to perform lifting tasks required for jobs.     Time  4    Period  Weeks    Status  On-going               Plan - 09/28/18 1513    Clinical Impression Statement  A: Manual therapy completed this session, pt with less tenderness today than previous session. Continued with A/ROM increasing repetitions to 15. Added proximal shoulder strengthening and red scapular theraband. Pt requiring verbal and visual cuing for form and technique.     Body Structure / Function / Physical Skills  ADL;Strength;Pain;UE functional use;ROM;IADL;Fascial restriction;Flexibility    Plan  P: Add x to v arms, continue with scapular theraband and add to HEP, add overhead lacing       Patient will  benefit from skilled therapeutic intervention in order to improve the following deficits and impairments:  Body Structure /  Function / Physical Skills  Visit Diagnosis: Other symptoms and signs involving the musculoskeletal system  Acute pain of left shoulder    Problem List Patient Active Problem List   Diagnosis Date Noted  . Memory loss 12/12/2017  . Morbid obesity ([place]) 09/05/2017  . Sinus node dysfunction ([place]) 01/06/2016  . Bradycardia 10/22/2015  . Bipolar disorder ([place]) 10/22/2015  . Anxiety 10/22/2015  . Pre-syncope 10/22/2015  . Episodic lightheadedness   . GERD (gastroesophageal reflux disease) 01/08/2014  . Hypertension 11/28/2013  . Palpitations 11/28/2013  . Osteoarthritis 11/08/2013  . Lumbar back pain 11/08/2013  . Hypertriglyceridemia 08/06/2013  . Depression 04/20/2013  . Breast cancer, left breast [facility]) 07/22/2011   Guadelupe Sabin, OTR/L  [phone] 09/28/2018, 3:15 PM  [facility] [address] [place], [place], [address] Phone: [phone]   Fax:  [phone]  Name: EVELISE REINE MRN: [id number] Date of Birth: [birth year]

## 2018-09-29 ENCOUNTER — Telehealth (HOSPITAL_COMMUNITY): Payer: Self-pay | Admitting: Occupational Therapy

## 2018-09-29 NOTE — Telephone Encounter (Signed)
Called and spoke with pt regarding 2 week clinic closure for COVID-19 precautions. Discussed HEP and made appts for week of 4/6.   Guadelupe Sabin, OTR/L  [phone] 09/29/2018

## 2018-10-02 ENCOUNTER — Ambulatory Visit: Payer: PPO

## 2018-10-03 ENCOUNTER — Encounter: Payer: Self-pay | Admitting: Family Medicine

## 2018-10-03 ENCOUNTER — Ambulatory Visit (HOSPITAL_COMMUNITY): Payer: PPO | Admitting: Occupational Therapy

## 2018-10-03 NOTE — Telephone Encounter (Signed)
Husband is not our pt so was not able to copy in his chart. Please advise if we can send in drops or if he needs to call his pcp.

## 2018-10-03 NOTE — Telephone Encounter (Signed)
Nurses May: Blephamide eyedrops per protocol Please let the patient know that they can use this for 3 to 4 days after that I do not recommend continuing of use  As for the mask currently we are good on those but I would recommend that she talk with a few of the assisted living facilities thanks

## 2018-10-05 ENCOUNTER — Ambulatory Visit (INDEPENDENT_AMBULATORY_CARE_PROVIDER_SITE_OTHER): Payer: PPO | Admitting: *Deleted

## 2018-10-05 ENCOUNTER — Ambulatory Visit (HOSPITAL_COMMUNITY): Payer: PPO | Admitting: Occupational Therapy

## 2018-10-05 ENCOUNTER — Other Ambulatory Visit: Payer: Self-pay

## 2018-10-05 DIAGNOSIS — I495 Sick sinus syndrome: Secondary | ICD-10-CM

## 2018-10-05 LAB — CUP PACEART REMOTE DEVICE CHECK
Implantable Lead Implant Date: 20170627
Implantable Lead Implant Date: 20170627
Implantable Lead Location: 753859
Implantable Lead Location: 753860
Implantable Lead Serial Number: 49436227
Implantable Pulse Generator Implant Date: 20170627
MDC IDC LEAD SERIAL: 49471106
MDC IDC SESS DTM: 20200326153421
Pulse Gen Model: 394969
Pulse Gen Serial Number: 68786455

## 2018-10-10 ENCOUNTER — Encounter: Payer: Self-pay | Admitting: Cardiology

## 2018-10-10 ENCOUNTER — Encounter (HOSPITAL_COMMUNITY): Payer: PPO

## 2018-10-10 NOTE — Progress Notes (Signed)
Remote pacemaker transmission.   

## 2018-10-11 ENCOUNTER — Telehealth (HOSPITAL_COMMUNITY): Payer: Self-pay

## 2018-10-11 NOTE — Telephone Encounter (Signed)
[name] was contacted today regarding the temporary reduction of OP rehab services due to concerns for community transmission of Covid-19.  Therapist advised patient to continue completing HEP.  Patient declined the continuation of their POC by using methods such as a telehealth visit.  Patient requested a follow up phone call once operations were back up and running to assess the need to continue in person therapy services.  Patient was made aware to contact the clinic if she has any questions or concerns in the mean time.   [name], OTR/L,CBIS  [phone]

## 2018-10-12 ENCOUNTER — Encounter (HOSPITAL_COMMUNITY): Payer: PPO | Admitting: Occupational Therapy

## 2018-10-17 ENCOUNTER — Encounter (HOSPITAL_COMMUNITY): Payer: Self-pay | Admitting: Occupational Therapy

## 2018-10-19 ENCOUNTER — Encounter (HOSPITAL_COMMUNITY): Payer: Self-pay | Admitting: Occupational Therapy

## 2018-11-07 ENCOUNTER — Ambulatory Visit: Payer: PPO

## 2018-11-10 DIAGNOSIS — F410 Panic disorder [episodic paroxysmal anxiety] without agoraphobia: Secondary | ICD-10-CM | POA: Diagnosis not present

## 2018-11-10 DIAGNOSIS — F3174 Bipolar disorder, in full remission, most recent episode manic: Secondary | ICD-10-CM | POA: Diagnosis not present

## 2018-11-10 DIAGNOSIS — F3176 Bipolar disorder, in full remission, most recent episode depressed: Secondary | ICD-10-CM | POA: Diagnosis not present

## 2018-11-17 ENCOUNTER — Encounter (HOSPITAL_COMMUNITY): Payer: Self-pay | Admitting: Occupational Therapy

## 2018-11-17 NOTE — Therapy (Signed)
[facility] [place], [place], [address] Phone: [phone]   Fax:  [phone]  Patient Details  Name: DESIA SABAN MRN: [id number] Date of Birth: 1951/04/24 Referring Provider:  No ref. provider found  Encounter Date: 11/17/2018  OCCUPATIONAL THERAPY DISCHARGE SUMMARY  Visits from Start of Care: 5  Current functional level related to goals / functional outcomes: Called to follow up on pt's shoulder pain since [place] clinic closure. Offered pt an appointment to return to therapy. Pt reports her arm is doing well and is at a functional level and would like to be discharged at this time. Pt is aware that she will need a new referral to return to therapy services.    Remaining deficits: Unknown   Education / Equipment: HEP for ROM and strengthening  Plan: Patient agrees to discharge.  Patient goals were partially met. Patient is being discharged due to being pleased with the current functional level.  ?????       Guadelupe Sabin, OTR/L  [phone] 11/17/2018, 2:04 PM  [facility] [address] [place], [place], [address] Phone: [phone]   Fax:  [phone]

## 2018-12-06 ENCOUNTER — Telehealth: Payer: Self-pay | Admitting: Family Medicine

## 2018-12-06 ENCOUNTER — Other Ambulatory Visit: Payer: Self-pay

## 2018-12-06 DIAGNOSIS — R7303 Prediabetes: Secondary | ICD-10-CM

## 2018-12-06 DIAGNOSIS — I10 Essential (primary) hypertension: Secondary | ICD-10-CM

## 2018-12-06 DIAGNOSIS — R5383 Other fatigue: Secondary | ICD-10-CM

## 2018-12-06 DIAGNOSIS — Z79899 Other long term (current) drug therapy: Secondary | ICD-10-CM

## 2018-12-06 DIAGNOSIS — C50912 Malignant neoplasm of unspecified site of left female breast: Secondary | ICD-10-CM

## 2018-12-06 DIAGNOSIS — E781 Pure hyperglyceridemia: Secondary | ICD-10-CM

## 2018-12-06 NOTE — Telephone Encounter (Signed)
Patient is aware orders in epic.

## 2018-12-06 NOTE — Telephone Encounter (Signed)
Pt has follow up scheduled for 6/15 she would like lab work done.   Also she is suppose to have a mammogram done June 16th. She had breast cancer in her left breast before. She is now having itching in her right breast and was told to notify the office so her mammogram could be changed to a diagnostic

## 2018-12-06 NOTE — Telephone Encounter (Signed)
Changed to Diagnostic and is rescheduled to June 16 at 12:40 at [place] [place].

## 2018-12-06 NOTE — Telephone Encounter (Signed)
Patient had labs drawn 09/14/2018 D dimer,Troponin 1, Lipase,Hepatic,Cbc,Bmet. Please advise regarding the Mammogram changing to diagnostic. Thanks,

## 2018-12-06 NOTE — Telephone Encounter (Signed)
Patient is aware 

## 2018-12-06 NOTE — Telephone Encounter (Signed)
Please see other phone message It would be fine to change her mammogram to diagnostic per her request

## 2018-12-06 NOTE — Telephone Encounter (Signed)
Lab orders placed in epic. Unable to speak with pt just yet.

## 2018-12-06 NOTE — Telephone Encounter (Signed)
Lipid, liver, metabolic 7, vitamin D 

## 2018-12-20 ENCOUNTER — Ambulatory Visit: Payer: PPO | Admitting: Family Medicine

## 2018-12-21 DIAGNOSIS — Z79899 Other long term (current) drug therapy: Secondary | ICD-10-CM | POA: Diagnosis not present

## 2018-12-21 DIAGNOSIS — I10 Essential (primary) hypertension: Secondary | ICD-10-CM | POA: Diagnosis not present

## 2018-12-21 DIAGNOSIS — R7303 Prediabetes: Secondary | ICD-10-CM | POA: Diagnosis not present

## 2018-12-21 DIAGNOSIS — E781 Pure hyperglyceridemia: Secondary | ICD-10-CM | POA: Diagnosis not present

## 2018-12-21 DIAGNOSIS — R5383 Other fatigue: Secondary | ICD-10-CM | POA: Diagnosis not present

## 2018-12-22 LAB — HEPATIC FUNCTION PANEL
ALT: 18 IU/L (ref 0–32)
AST: 22 IU/L (ref 0–40)
Albumin: 4.6 g/dL (ref 3.8–4.8)
Alkaline Phosphatase: 97 IU/L (ref 39–117)
Bilirubin Total: 0.5 mg/dL (ref 0.0–1.2)
Bilirubin, Direct: 0.1 mg/dL (ref 0.00–0.40)
Total Protein: 6.8 g/dL (ref 6.0–8.5)

## 2018-12-22 LAB — BASIC METABOLIC PANEL
BUN/Creatinine Ratio: 30 — ABNORMAL HIGH (ref 12–28)
BUN: 20 mg/dL (ref 8–27)
CO2: 25 mmol/L (ref 20–29)
Calcium: 9.6 mg/dL (ref 8.7–10.3)
Chloride: 104 mmol/L (ref 96–106)
Creatinine, Ser: 0.66 mg/dL (ref 0.57–1.00)
GFR calc Af Amer: 106 mL/min/{1.73_m2} (ref >59)
GFR calc non Af Amer: 92 mL/min/{1.73_m2} (ref >59)
Glucose: 112 mg/dL — ABNORMAL HIGH (ref 65–99)
Potassium: 5.1 mmol/L (ref 3.5–5.2)
Sodium: 142 mmol/L (ref 134–144)

## 2018-12-22 LAB — VITAMIN D 25 HYDROXY (VIT D DEFICIENCY, FRACTURES): Vit D, 25-Hydroxy: 52 ng/mL (ref 30.0–100.0)

## 2018-12-22 LAB — LIPID PANEL
Chol/HDL Ratio: 4.2 ratio (ref 0.0–4.4)
Cholesterol, Total: 158 mg/dL (ref 100–199)
HDL: 38 mg/dL — ABNORMAL LOW (ref >39)
LDL Calculated: 70 mg/dL (ref 0–99)
Triglycerides: 249 mg/dL — ABNORMAL HIGH (ref 0–149)
VLDL Cholesterol Cal: 50 mg/dL — ABNORMAL HIGH (ref 5–40)

## 2018-12-25 ENCOUNTER — Other Ambulatory Visit: Payer: Self-pay

## 2018-12-25 ENCOUNTER — Ambulatory Visit (INDEPENDENT_AMBULATORY_CARE_PROVIDER_SITE_OTHER): Payer: PPO | Admitting: Family Medicine

## 2018-12-25 DIAGNOSIS — M545 Low back pain, unspecified: Secondary | ICD-10-CM

## 2018-12-25 MED ORDER — SHINGRIX 50 MCG/0.5ML IM SUSR: 0.5000 mL | Freq: Once | INTRAMUSCULAR | 1 refills | Status: AC

## 2018-12-25 NOTE — Addendum Note (Signed)
Addended by: Vicente Males on: 12/25/2018 01:53 PM   Modules accepted: Orders

## 2018-12-25 NOTE — Progress Notes (Signed)
Shingrix script printed out; awaiting signature. Envelope addressed and will mail to patient once signed

## 2018-12-25 NOTE — Progress Notes (Signed)
   Subjective:    Patient ID: [name], female    DOB: 12/08/1950, 68 y.o.   MRN: [id number] Telephone only video not possible HPI 6 month follow up. Pt states she is doing well except having some lower back pain. Pt states her back pain is chronic. Worst first thing in the morning. Pt has been using Diclofenac gel and OTC Salonpas patches. Pt states she is walking one mile 6 times a week and using an under the desk bike 5-6 times a week.   Virtual Visit via Video Note  I connected with [name] on 12/25/18 at 11:30 AM EDT by a video enabled telemedicine application and verified that I am speaking with the correct person using two identifiers.  Location: Patient: home Provider: office   I discussed the limitations of evaluation and management by telemedicine and the availability of in person appointments. The patient expressed understanding and agreed to proceed.  History of Present Illness:    Observations/Objective:   Assessment and Plan:   Follow Up Instructions:    I discussed the assessment and treatment plan with the patient. The patient was provided an opportunity to ask questions and all were answered. The patient agreed with the plan and demonstrated an understanding of the instructions.   The patient was advised to call back or seek an in-person evaluation if the symptoms worsen or if the condition fails to improve as anticipated.  I provided 15 minutes of non-face-to-face time during this encounter.   Vicente Males, LPN    Review of Systems  Constitutional: Negative for activity change, fatigue and fever.  HENT: Negative for congestion and rhinorrhea.   Respiratory: Negative for cough, chest tightness and shortness of breath.   Cardiovascular: Negative for chest pain and leg swelling.  Gastrointestinal: Negative for abdominal pain and nausea.  Skin: Negative for color change.  Neurological: Negative for dizziness and headaches.   Psychiatric/Behavioral: Negative for agitation and behavioral problems.       Objective:   Physical Exam  Today's visit was via telephone Physical exam was not possible for this visit       Assessment & Plan:  Lumbar back pain stretching exercises recommended.  Hold off on any type of additional imaging patient will let us know if any ongoing troubles recommend OTC measures as needed recent lab work overall looks good cholesterol slightly abnormal also some prediabetes healthy diet weight loss discussed Patient unfortunately has morbid obesity related into age genetics as well as her medications that the psychiatrist has her on she will do the best he can getting it under control  Shin Grix vaccine recommended

## 2018-12-26 ENCOUNTER — Ambulatory Visit
Admission: RE | Admit: 2018-12-26 | Discharge: 2018-12-26 | Disposition: A | Discharge status: Home / Self Care | Payer: PPO | Source: Ambulatory Visit | Attending: Family Medicine | Admitting: Family Medicine

## 2018-12-26 ENCOUNTER — Ambulatory Visit: Payer: PPO

## 2018-12-26 ENCOUNTER — Other Ambulatory Visit: Payer: Self-pay

## 2018-12-26 DIAGNOSIS — Z853 Personal history of malignant neoplasm of breast: Secondary | ICD-10-CM | POA: Diagnosis not present

## 2018-12-26 DIAGNOSIS — N6459 Other signs and symptoms in breast: Secondary | ICD-10-CM | POA: Diagnosis not present

## 2018-12-26 DIAGNOSIS — R928 Other abnormal and inconclusive findings on diagnostic imaging of breast: Secondary | ICD-10-CM | POA: Diagnosis not present

## 2018-12-26 DIAGNOSIS — N644 Mastodynia: Secondary | ICD-10-CM | POA: Diagnosis not present

## 2018-12-26 DIAGNOSIS — C50912 Malignant neoplasm of unspecified site of left female breast: Secondary | ICD-10-CM

## 2019-01-04 ENCOUNTER — Ambulatory Visit (INDEPENDENT_AMBULATORY_CARE_PROVIDER_SITE_OTHER): Payer: PPO | Admitting: *Deleted

## 2019-01-04 DIAGNOSIS — I495 Sick sinus syndrome: Secondary | ICD-10-CM | POA: Diagnosis not present

## 2019-01-04 LAB — CUP PACEART REMOTE DEVICE CHECK
Date Time Interrogation Session: 20200625094527
Implantable Lead Implant Date: 20170627
Implantable Lead Implant Date: 20170627
Implantable Lead Location: 753859
Implantable Lead Location: 753860
Implantable Lead Model: 377
Implantable Lead Model: 377
Implantable Lead Serial Number: 49436227
Implantable Lead Serial Number: 49471106
Implantable Pulse Generator Implant Date: 20170627
Pulse Gen Model: 394969
Pulse Gen Serial Number: 68786455

## 2019-01-04 IMAGING — MR MR LUMBAR SPINE W/O CM
4 of 5 series · 17 of 48 positions shown · non-contrast
Comparison: Lumbar radiographs 03/14/2014. CT Abdomen and Pelvis
12/25/2013.

CLINICAL DATA: 66-year-old female with lumbar back pain radiating
to the right hip and also the left leg.

MRI compatible pacemaker.
EXAM:
MRI LUMBAR SPINE WITHOUT CONTRAST
TECHNIQUE: Multiplanar, multisequence MR imaging of the lumbar spine was
performed. No intravenous contrast was administered.
No adverse events were reported at the conclusion of the scan with
regard to the patient's MRI compatible pacemaker.

[Series 3: T2 · sagittal · 4.0mm · 0.55mm/px · 6 of 13 slices shown (1 of 2)]
[im 1/13]
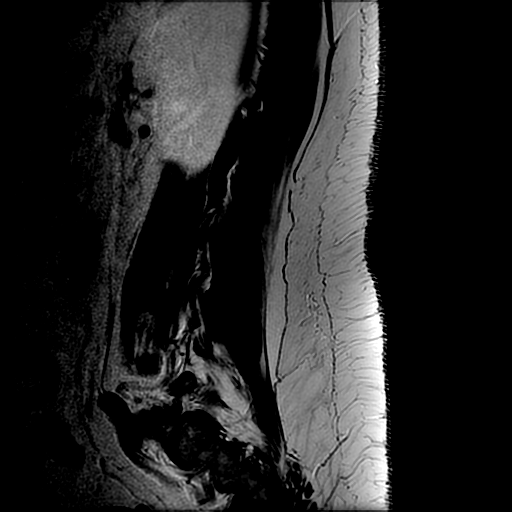
[im 3/13]
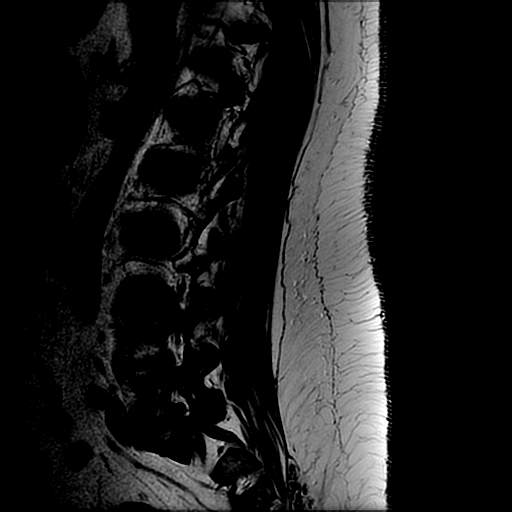
[im 5/13]
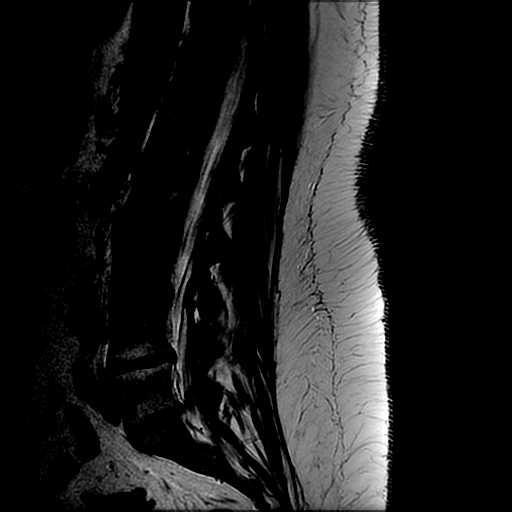
[im 8/13]
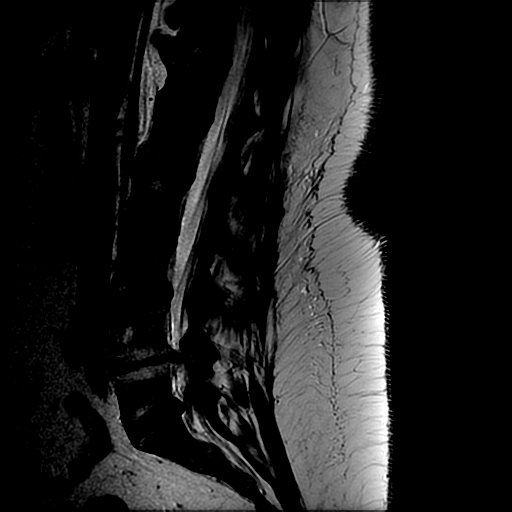
[im 10/13]
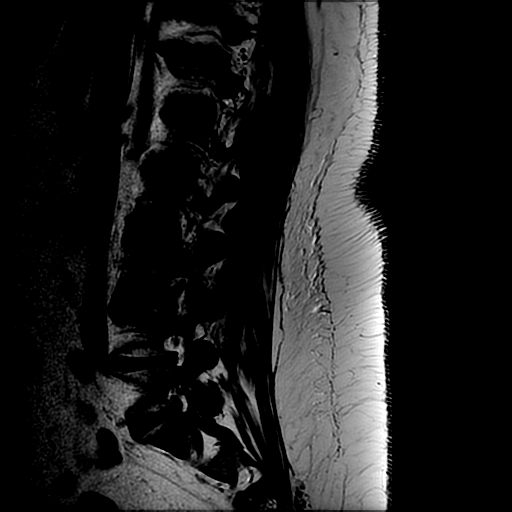
[im 13/13]
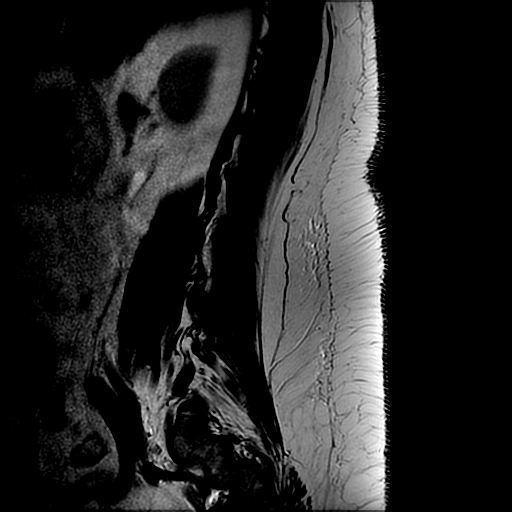

[Series 4: T1 · sagittal · 4.0mm · 0.55mm/px · 3 of 13 slices shown (1 of 2)]
[im 3/13]
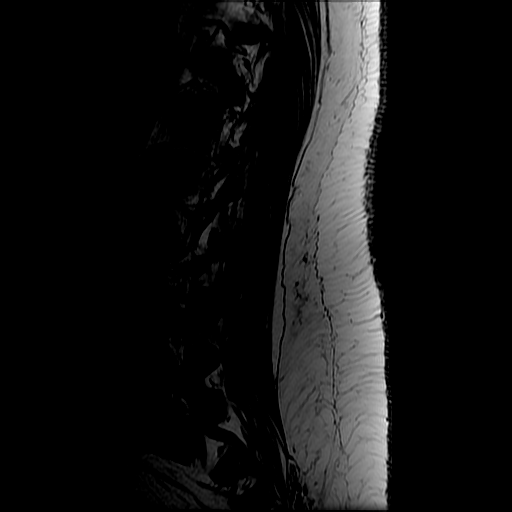
[im 8/13]
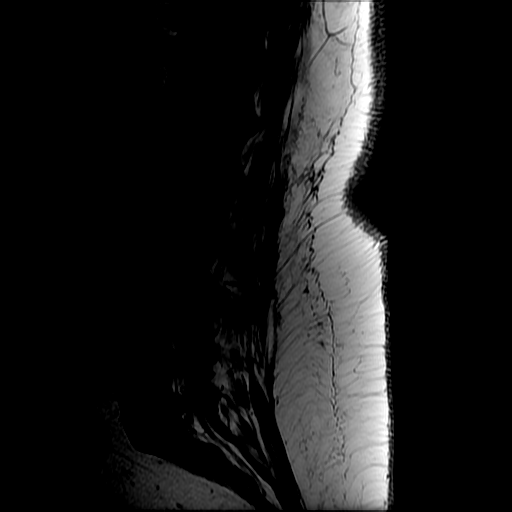
[im 13/13]
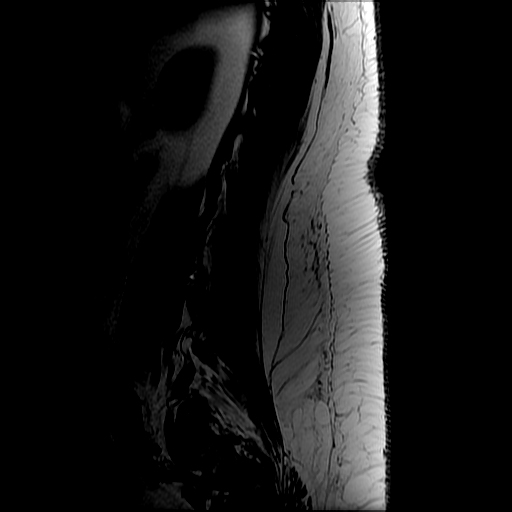

[Series 6: T2 · axial · 4.0mm · 0.39mm/px · z∈[-145,+8]mm · 5 of 32 slices shown (2 of 2)]
[im 1/32]
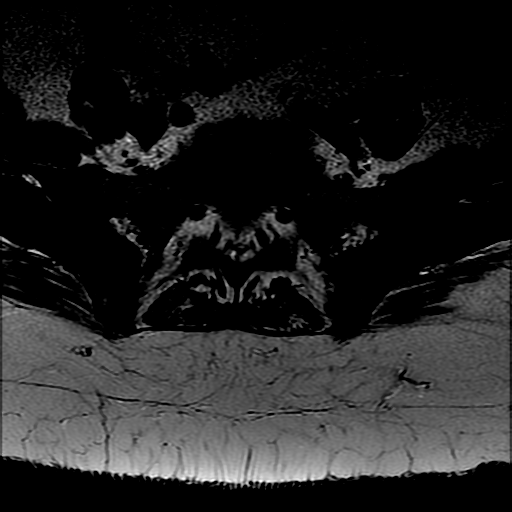
[im 5/32]
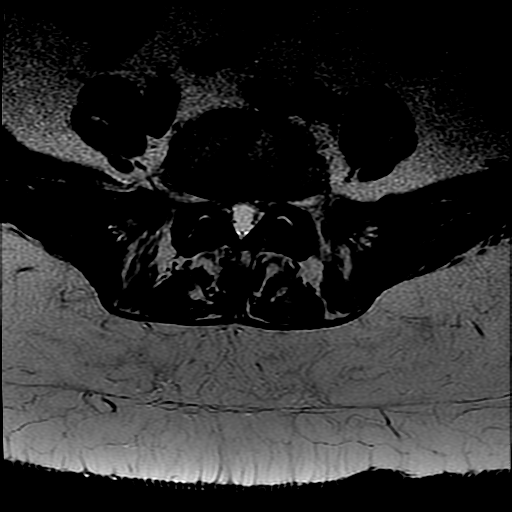
[im 9/32]
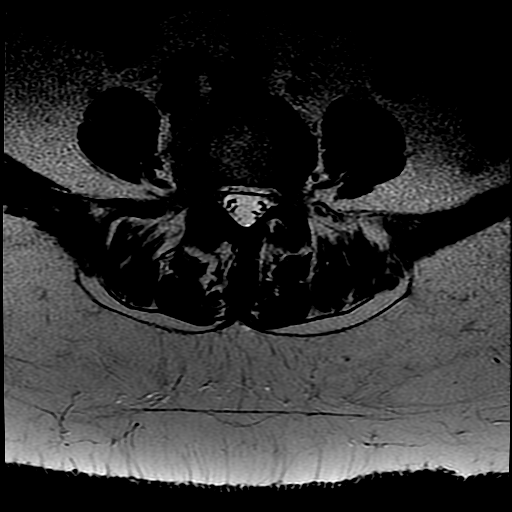
[im 16/32]
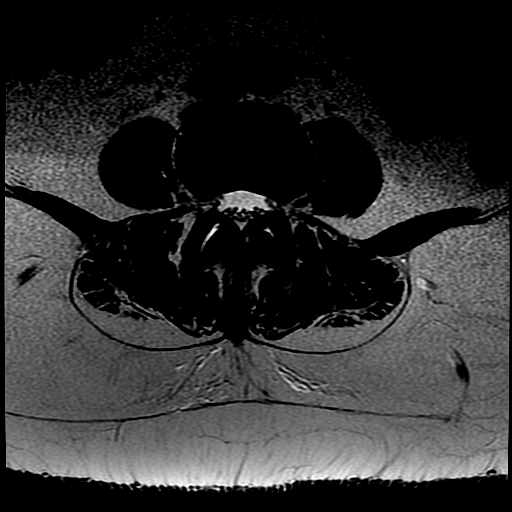
[im 27/32]
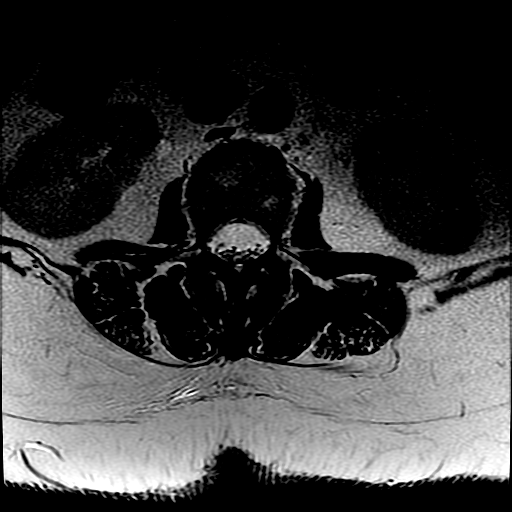

[Series 7: T1 · axial · 4.0mm · 0.39mm/px · z∈[-125,+8]mm · 3 of 32 slices shown (2 of 2)]
[im 5/32]
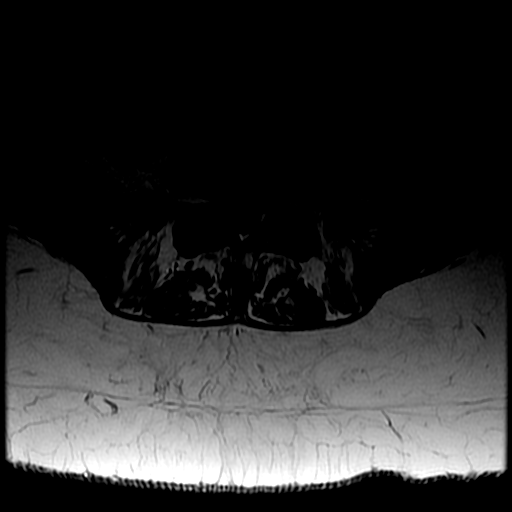
[im 16/32]
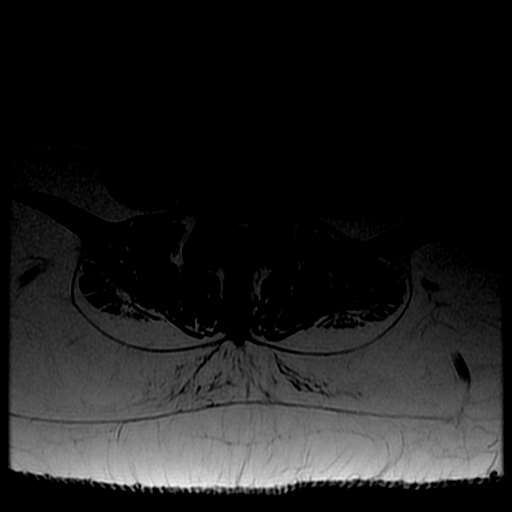
[im 27/32]
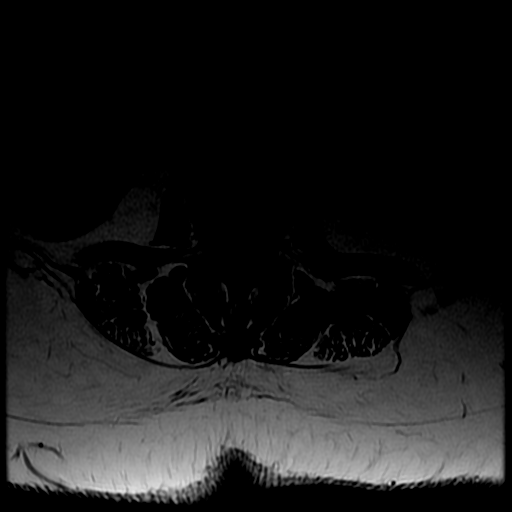

[17 of 48 positions shown; findings below may reference images not displayed]

FINDINGS: Segmentation:  Normal as demonstrated on the comparisons.

Alignment: Stable since 2177 with chronic straightening of lower
lumbar lordosis, and subtle retrolisthesis of L5 on S1.

Vertebrae: Chronic degenerative endplate changes at L4-L5. Mild
similar degenerative marrow changes along the right lateral
endplates at L5-S1. No marrow edema or evidence of acute osseous
abnormality. Visible sacrum is intact.

Conus medullaris: Extends to the L1-L2 level and appears normal.

Paraspinal and other soft tissues: Negative aside from
diverticulosis of the distal colon.

Disc levels:

Chronic bulky right anterior endplate osteophytes, and mild facet
hypertrophy in the lower thoracic spine. No lower thoracic spinal
stenosis.

T12-L1:  Negative.

L1-L2:  Negative.

L2-L3:  Mild facet hypertrophy.  Otherwise negative.

L3-L4: Mild disc desiccation and far lateral disc bulging. Small
right subarticular annular fissure of the disc best seen on series
5, image 5. Mild endplate spurring. Mild to moderate facet and
ligament flavum hypertrophy. Bilateral facet joint fluid (series 6,
image 17). No spinal stenosis. The annular fissure and right
endplate spurring might possibly affect the exiting right L3 your
descending right L4 nerves.

L4-L5: Chronic disc space loss. Vacuum disc here in 2177. Right
eccentric circumferential disc osteophyte complex with broad-based
posterior component. Mild to moderate facet and ligament flavum
hypertrophy. Trace facet joint fluid greater on the left (series 6,
image 23). No spinal stenosis. Mild right lateral recess stenosis.
Borderline to mild right L4 foraminal stenosis.

L5-S1: Disc desiccation with circumferential but mostly far lateral
disc bulging and endplate spurring. Broad-based posterior component.
Moderate facet hypertrophy with bilateral facet joint fluid. No
spinal stenosis. Borderline to mild right lateral recess stenosis
(descending right L5 nerve root level). Mild to moderate right
greater than left L5 foraminal stenosis.
IMPRESSION: 1. Advanced chronic disc and endplate degeneration at L4-L5, and
moderately advanced degeneration at L5-S1. No spinal stenosis at
either level but there is up to mild right lateral recess stenosis,
and mild to moderate right foraminal stenosis at both levels.
Correlate with right L4 through S1 radiculitis.
2. Rightward disc degeneration at L3-L4 might also be a source of
right L4 radiculitis, or affect the exiting right L3 nerve.
3. Mild to moderate left L5 neural foraminal stenosis, but no other
left side lumbar neural impingement identified.
4. Chronic endplate degeneration with bulky osteophytes in the lower
thoracic spine.
5. Diverticulosis of the distal colon.

## 2019-01-11 ENCOUNTER — Encounter: Payer: Self-pay | Admitting: Cardiology

## 2019-01-11 NOTE — Progress Notes (Signed)
Remote pacemaker transmission.   

## 2019-01-16 ENCOUNTER — Telehealth: Payer: Self-pay | Admitting: Family Medicine

## 2019-01-16 NOTE — Telephone Encounter (Signed)
Patient mailed in paper work to be completed for department of transportation in your box for review and she also attached a note on front of paperwork for you to review.

## 2019-01-22 NOTE — Telephone Encounter (Signed)
please let the patient know that I did receive her forms regarding her driving We will be happy to fill these out but I must have verification on these issues Please let the patient know that I can do a phone visit with her this week Are there any general times this week that she would be available for a phone call? Please explained to her that I am out of the office this week but I will call her in order to complete this form for this week (Essentially do a office visit over the phone) Please talk with the patient then send me notification regarding her reply

## 2019-01-28 ENCOUNTER — Other Ambulatory Visit: Payer: Self-pay | Admitting: Family Medicine

## 2019-01-28 NOTE — Telephone Encounter (Signed)
I filled out the form I spoke with the patient Please do the following - Mail the form and fax the form to [place] [employer] Mail a copy to the patient Scan this she said the form that had writing on it in 2 our system Epic Please send patient my chart message letting her know that this has been completed Finally have the front put her on my schedule [name] as arrived for a 440 appointment for Monday, 01/29/2019

## 2019-01-29 ENCOUNTER — Ambulatory Visit (INDEPENDENT_AMBULATORY_CARE_PROVIDER_SITE_OTHER): Payer: PPO | Admitting: Family Medicine

## 2019-01-29 ENCOUNTER — Other Ambulatory Visit: Payer: Self-pay

## 2019-01-29 DIAGNOSIS — F3170 Bipolar disorder, currently in remission, most recent episode unspecified: Secondary | ICD-10-CM

## 2019-01-29 DIAGNOSIS — I495 Sick sinus syndrome: Secondary | ICD-10-CM | POA: Diagnosis not present

## 2019-01-29 NOTE — Progress Notes (Signed)
Office visit virtual Phone only I connected with  [name] on 01/29/19 by a video enabled telemedicine application and verified that I am speaking with the correct person using two identifiers.   I discussed the limitations of evaluation and management by telemedicine. The patient expressed understanding and agreed to proceed.  15 minutes spent discussing these issues 15 minutes was spent with patient today discussing healthcare issues which they came.  More than 50% of this visit-total duration of visit-was spent in counseling and coordination of care.  Please see diagnosis regarding the focus of this coordination and care  Patient has underlying psychiatric issues including bipolar and depression.  Under good control is been under the care of specialist the past 7 years doing well with medications no complications She does have sinus node dysfunction and is has a pacemaker and follows with cardiology on a regular basis  She does have morbid obesity she is trying to do the best she can at watching her diet staying active and losing weight it is difficult to lose weight on the psychiatric medicines she states her depression under good control bipolar is stable Patient also has pacemaker for which she has been doing well no passing out spells no dizziness no headaches or chest pain or shortness of breath  Today's visit was via telephone Physical exam was not possible for this visit  We also discussed driving she does well with her driving no dizziness no seizures no passing out.  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

## 2019-01-30 NOTE — Telephone Encounter (Signed)
Form was mailed into Division of motor vehicles and faxed and also copy was mailed to patient.

## 2019-03-05 ENCOUNTER — Other Ambulatory Visit: Payer: Self-pay

## 2019-03-05 DIAGNOSIS — Z20822 Contact with and (suspected) exposure to covid-19: Secondary | ICD-10-CM

## 2019-03-06 ENCOUNTER — Other Ambulatory Visit: Payer: Self-pay

## 2019-03-06 ENCOUNTER — Telehealth: Payer: Self-pay

## 2019-03-06 ENCOUNTER — Ambulatory Visit
Admission: EM | Admit: 2019-03-06 | Discharge: 2019-03-06 | Disposition: A | Discharge status: Home / Self Care | Payer: PPO | Attending: Emergency Medicine | Admitting: Emergency Medicine

## 2019-03-06 DIAGNOSIS — M542 Cervicalgia: Secondary | ICD-10-CM | POA: Diagnosis not present

## 2019-03-06 DIAGNOSIS — M62838 Other muscle spasm: Secondary | ICD-10-CM

## 2019-03-06 DIAGNOSIS — R195 Other fecal abnormalities: Secondary | ICD-10-CM

## 2019-03-06 LAB — NOVEL CORONAVIRUS, NAA: SARS-CoV-2, NAA: NOT DETECTED

## 2019-03-06 MED ORDER — PREDNISONE 20 MG PO TABS: 20.0000 mg | ORAL_TABLET | Freq: Two times a day (BID) | ORAL | 0 refills | Status: AC

## 2019-03-06 MED ORDER — TIZANIDINE HCL 2 MG PO CAPS: 2.0000 mg | ORAL_CAPSULE | Freq: Every evening | ORAL | 0 refills | Status: DC | PRN

## 2019-03-06 MED ORDER — METHYLPREDNISOLONE SODIUM SUCC 125 MG IJ SOLR
125.0000 mg | Freq: Once | INTRAMUSCULAR | Status: AC
  Administered 2019-03-06: 125 mg via INTRAMUSCULAR

## 2019-03-06 NOTE — Discharge Instructions (Signed)
Steroid shot given in office Continue conservative management of rest, ice, heat, and gentle massage Take prednisone as needed for pain relief (may cause abdominal discomfort, ulcers, and GI bleeds avoid taking with other NSAIDs) Take cyclobenzaprine at nighttime for symptomatic relief. Avoid driving or operating heavy machinery while using medication. Follow up with PCP if symptoms persist Return or go to the ER if you have any new or worsening symptoms (fever, chills, chest pain, abdominal pain, changes in bowel or bladder habits, pain radiating into lower legs, etc...)   Rest and push fluids Supplement with OTC pedialyte and/or oral rehydration solution Follow up with PCP if symptoms persists Return or go to the ED if you have any new or worsening symptoms such as fever, chills, nausea, vomiting, abdominal pain, persistent watery diarrhea, blood in stool, inability to keep fluids down, etc..[name]

## 2019-03-06 NOTE — ED Triage Notes (Signed)
Pt developed neck pain yesterday, denies injury

## 2019-03-06 NOTE — ED Provider Notes (Signed)
[facility]   [id number] 03/06/19 Arrival Time: [id number]  CC: Neck pain and diarrhea  SUBJECTIVE: History from: patient. [name] is a 68 y.o. female complains of bilateral neck pain R>L  that began 1 day ago.  Denies a precipitating event or specific injury.  Localizes the pain to the right and left side.  Describes the pain as constant.  7/10.  Has tried heat without relief.  Symptoms are made worse with neck ROM.  Denies similar symptoms in the past.  Denies fever, chills, erythema, ecchymosis, effusion, weakness, numbness and tingling.  Complains of improving diarrhea/ loose stools that began 1 week ago.  Having approximately 1-3 episodes daily.  Denies precipitating event, recent travel, antibiotic use, close contacts with similar symptoms.  Has been taking pepto with relief.  Denies aggravating factors, states symptoms overall have improved.  Complains of associated nausea.  Denies vomiting, abdominal pain, blood in stool, urinary frequency, urgency, or dysuria.    ROS: As per HPI.  All other pertinent ROS negative.     Past Medical History:  Diagnosis Date  . Anxiety   . Bipolar 1 disorder ([place])   . 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 Breast cancer, left breast ([place]) 07/22/2011  . Cancer ([place])   . Depression   . High cholesterol   . Hypertension   . IBS (irritable bowel syndrome)   . Impaired fasting glucose   . Memory loss   . OA (osteoarthritis)   . Obesity   . Personal history of chemotherapy 2009  . Personal history of radiation therapy 2009  . Polycystic ovarian disease   . 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 Sleep apnea    Past Surgical History:  Procedure Laterality Date  . ABDOMINAL HYSTERECTOMY    . APPENDECTOMY    . BREAST BIOPSY Left 2011  . BREAST BIOPSY  2008  . BREAST LUMPECTOMY Left 2008  . CHOLECYSTECTOMY    . COLONOSCOPY N/A 09/13/2013   Procedure: COLONOSCOPY;  Surgeon:  [name], MD;  [facility];  Service: Endoscopy;  Laterality: N/A;  730  . EP IMPLANTABLE DEVICE N/A 01/06/2016   Procedure: Pacemaker Implant;  Surgeon: [name], MD;  Location: [facility];  Service: Cardiovascular;  Laterality: N/A;  . ESOPHAGOGASTRODUODENOSCOPY N/A 01/17/2014   Procedure: ESOPHAGOGASTRODUODENOSCOPY (EGD);  Surgeon: [name], MD;  [facility];  Service: Endoscopy;  Laterality: N/A;  245  . TOTAL KNEE ARTHROPLASTY Bilateral right knee   2012, 2007   Allergies  Allergen Reactions  . Imipramine Hives and Rash  . Tricor [Fenofibrate] Other (See Comments)    Pain, "aching"   No current facility-administered medications on file prior to encounter.    Current Outpatient Medications on File Prior to Encounter  Medication Sig Dispense Refill  . acetaminophen (TYLENOL 8 HOUR) 650 MG CR tablet Take 650-1,300 mg by mouth See admin instructions. 1300MG  IN THE MORNING AND 650MG  AT BEDTIME    . ALPRAZolam (XANAX XR) 1 MG 24 hr tablet Take 1 mg by mouth every morning.     [name] ALPRAZolam (XANAX) 1 MG tablet Take 0.5 mg by mouth daily as needed for anxiety.     . calcium carbonate (TUMS - DOSED IN MG ELEMENTAL CALCIUM) 500 MG chewable tablet Chew 1-3 tablets by mouth daily as needed for indigestion or heartburn.    [name] glucosamine-chondroitin 500-400 MG tablet Take 2 tablets by mouth  every morning.     . lamoTRIgine (LAMICTAL) 200 MG tablet Take 400 mg by mouth at bedtime.     . naproxen sodium (ALEVE) 220 MG tablet Take 220 mg by mouth daily as needed (for pain).    . QUEtiapine (SEROQUEL) 200 MG tablet Take 400 mg by mouth at bedtime.     . Vitamin D, Ergocalciferol, (DRISDOL) 50000 units CAPS capsule Take 50,000 Units by mouth every Sunday.      Social History   Socioeconomic History  . Marital status: Married    Spouse name: Not on file  . Number of children: 3  . Years of education: college  . Highest education level: Associate  degree: occupational, Hotel manager, or vocational program  Occupational History  . Occupation: Therapist, sports  Social Needs  . Financial resource strain: Not on file  . Food insecurity    Worry: Not on file    Inability: Not on file  . Transportation needs    Medical: Not on file    Non-medical: Not on file  Tobacco Use  . Smoking status: Never Smoker  . Smokeless tobacco: Never Used  Substance and Sexual Activity  . Alcohol use: No    Alcohol/week: 0.0 standard drinks  . Drug use: No  . Sexual activity: Never  Lifestyle  . Physical activity    Days per week: Not on file    Minutes per session: Not on file  . Stress: Not on file  Relationships  . Social Herbalist on phone: Not on file    Gets together: Not on file    Attends religious service: Not on file    Active member of club or organization: Not on file    Attends meetings of clubs or organizations: Not on file    Relationship status: Not on file  . Intimate partner violence    Fear of current or ex partner: Not on file    Emotionally abused: Not on file    Physically abused: Not on file    Forced sexual activity: Not on file  Other Topics Concern  . Not on file  Social History Narrative   04/24/2013 [name] was born and grew up in [place]. She reports that her childhood was "tough." She has 2 older sisters and a younger brother. She achieved an Associates Degree in nursing. 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 She has 2 daughters and one son. She denies any legal difficulties. She affiliates as Engineer, manufacturing. Her hobbies include reading, sewing, crafts, and cooking. She reports that her social support system consists of her friend and passed her. 04/24/2013 [name]      Lives alone.   Right-handed.   No caffeine use.   Family History  Problem Relation Age of Onset  . Bipolar disorder Mother   . Diabetes Mother   . Bipolar disorder Sister   . Diabetes Sister    . Alcohol abuse Father   . Hypertension Father   . Heart disease Other     OBJECTIVE:  Vitals:   03/06/19 1146  BP: 139/79  Pulse: 65  Resp: 20  Temp: 97.9 F (36.6 C)  SpO2: 95%    General appearance: ALERT; appears uncomfortable, but nontoxic Head: NCAT Lungs: Normal respiratory effort; CTAB CV: RRR Musculoskeletal: Neck Inspection: Skin warm, dry, clear and intact without obvious erythema, effusion, or ecchymosis.  Palpation: TTP over bilateral cervical musculature,  no midline tenderness ROM: LROM about the neck Strength: 5/5 shld abduction, 5/5 shld adduction, 5/5 elbow flexion, 5/5 elbow extension, 5/5 grip strength, 5/5 hip flexion, 5/5 knee abduction, 5/5 knee adduction, 5/5 knee flexion, 5/5 knee extension Abdomen: soft, nondistended, normal active bowel sounds; nontender to palpation; no guarding  Skin: warm and dry Neurologic: Ambulates without difficulty; Sensation intact about the upper/ lower extremities Psychological: alert and cooperative; normal mood and affect  ASSESSMENT & PLAN:  1. Neck pain   2. Neck muscle spasm   3. Loose stools    Meds ordered this encounter  Medications  . tizanidine (ZANAFLEX) 2 MG capsule    Sig: Take 1 capsule (2 mg total) by mouth at bedtime as needed for muscle spasms.    Dispense:  15 capsule    Refill:  0    Order Specific Question:   Supervising Provider    Answer:   Raylene Everts [id number]  . predniSONE (DELTASONE) 20 MG tablet    Sig: Take 1 tablet (20 mg total) by mouth 2 (two) times daily with a meal for 5 days.    Dispense:  10 tablet    Refill:  0    Order Specific Question:   Supervising Provider    Answer:   Raylene Everts [id number]  . methylPREDNISolone sodium succinate (SOLU-MEDROL) 125 mg/2 mL injection 125 mg   Steroid shot given in office Continue conservative management of rest, ice, heat, and gentle massage Take prednisone as needed for pain relief (may cause abdominal discomfort, ulcers,  and GI bleeds avoid taking with other NSAIDs) Take cyclobenzaprine at nighttime for symptomatic relief. Avoid driving or operating heavy machinery while using medication. Follow up with PCP if symptoms persist Return or go to the ER if you have any new or worsening symptoms (fever, chills, chest pain, abdominal pain, changes in bowel or bladder habits, pain radiating into lower legs, etc...)   Rest and push fluids Supplement with OTC pedialyte and/or oral rehydration solution Follow up with PCP if symptoms persists Return or go to the ED if you have any new or worsening symptoms such as fever, chills, nausea, vomiting, abdominal pain, persistent watery diarrhea, blood in stool, inability to keep fluids down, etc...   Reviewed expectations re: course of current medical issues. Questions answered. Outlined signs and symptoms indicating need for more acute intervention. Patient verbalized understanding. After Visit Summary given.    Lestine Box, PA-C 03/06/19 1408

## 2019-03-06 NOTE — Telephone Encounter (Signed)
Pt returned call for her COVID-19 test result. She was told her test was negative and she was not infected with the Novel Coronavirus.  She states she has been to the doctors today for diarrhea. She was told to continue monitoring for symptoms. S/S reviewed.  She verbalized understanding of all instructions.

## 2019-04-05 ENCOUNTER — Ambulatory Visit (INDEPENDENT_AMBULATORY_CARE_PROVIDER_SITE_OTHER): Payer: PPO | Admitting: *Deleted

## 2019-04-05 DIAGNOSIS — I495 Sick sinus syndrome: Secondary | ICD-10-CM | POA: Diagnosis not present

## 2019-04-05 LAB — CUP PACEART REMOTE DEVICE CHECK
Battery Remaining Percentage: 80 %
Brady Statistic RA Percent Paced: 45 %
Brady Statistic RV Percent Paced: 0 %
Date Time Interrogation Session: 20200924131952
Implantable Lead Implant Date: 20170627
Implantable Lead Implant Date: 20170627
Implantable Lead Location: 753859
Implantable Lead Location: 753860
Implantable Lead Model: 377
Implantable Lead Model: 377
Implantable Lead Serial Number: 49436227
Implantable Lead Serial Number: 49471106
Implantable Pulse Generator Implant Date: 20170627
Lead Channel Impedance Value: 488 Ohm
Lead Channel Impedance Value: 527 Ohm
Lead Channel Pacing Threshold Amplitude: 1.2 V
Lead Channel Pacing Threshold Pulse Width: 0.4 ms
Lead Channel Sensing Intrinsic Amplitude: 10.7 mV
Lead Channel Sensing Intrinsic Amplitude: 2.5 mV
Lead Channel Setting Pacing Amplitude: 2 V
Lead Channel Setting Pacing Amplitude: 2.4 V
Lead Channel Setting Pacing Pulse Width: 0.4 ms
Pulse Gen Model: 394969
Pulse Gen Serial Number: 68786455

## 2019-04-11 ENCOUNTER — Encounter: Payer: Self-pay | Admitting: Cardiology

## 2019-04-11 DIAGNOSIS — F4323 Adjustment disorder with mixed anxiety and depressed mood: Secondary | ICD-10-CM | POA: Diagnosis not present

## 2019-04-11 NOTE — Progress Notes (Signed)
Remote pacemaker transmission.   

## 2019-05-02 DIAGNOSIS — F4323 Adjustment disorder with mixed anxiety and depressed mood: Secondary | ICD-10-CM | POA: Diagnosis not present

## 2019-05-03 ENCOUNTER — Ambulatory Visit (INDEPENDENT_AMBULATORY_CARE_PROVIDER_SITE_OTHER): Payer: PPO | Admitting: Internal Medicine

## 2019-05-03 ENCOUNTER — Other Ambulatory Visit: Payer: Self-pay

## 2019-05-03 ENCOUNTER — Encounter: Payer: Self-pay | Admitting: Internal Medicine

## 2019-05-03 VITALS — BP 147/76 | HR 77 | Temp 97.8°F | Ht 64.0 in | Wt 276.0 lb

## 2019-05-03 DIAGNOSIS — Z950 Presence of cardiac pacemaker: Secondary | ICD-10-CM

## 2019-05-03 DIAGNOSIS — I495 Sick sinus syndrome: Secondary | ICD-10-CM | POA: Diagnosis not present

## 2019-05-03 LAB — CUP PACEART INCLINIC DEVICE CHECK
Brady Statistic RA Percent Paced: 42 %
Brady Statistic RV Percent Paced: 0 %
Date Time Interrogation Session: 20201022174200
Implantable Lead Implant Date: 20170627
Implantable Lead Implant Date: 20170627
Implantable Lead Location: 753859
Implantable Lead Location: 753860
Implantable Lead Model: 377
Implantable Lead Model: 377
Implantable Lead Serial Number: 49436227
Implantable Lead Serial Number: 49471106
Implantable Pulse Generator Implant Date: 20170627
Lead Channel Impedance Value: 526 Ohm
Lead Channel Impedance Value: 565 Ohm
Lead Channel Pacing Threshold Amplitude: 0.8 V
Lead Channel Pacing Threshold Amplitude: 1.4 V
Lead Channel Pacing Threshold Pulse Width: 0.4 ms
Lead Channel Pacing Threshold Pulse Width: 0.4 ms
Lead Channel Sensing Intrinsic Amplitude: 11.1 mV
Lead Channel Sensing Intrinsic Amplitude: 2.2 mV
Lead Channel Setting Pacing Amplitude: 2 V
Lead Channel Setting Pacing Amplitude: 2.4 V
Lead Channel Setting Pacing Pulse Width: 0.4 ms
Pulse Gen Model: 394969
Pulse Gen Serial Number: 68786455

## 2019-05-03 NOTE — Patient Instructions (Signed)
Medication Instructions:  Your physician recommends that you continue on your current medications as directed. Please refer to the Current Medication list given to you today.  *If you need a refill on your cardiac medications before your next appointment, please call your pharmacy*  Lab Work: NONE  If you have labs (blood work) drawn today and your tests are completely normal, you will receive your results only by: . MyChart Message (if you have MyChart) OR . A paper copy in the mail If you have any lab test that is abnormal or we need to change your treatment, we will call you to review the results.  Testing/Procedures: NONE   Follow-Up: At [facility], you and your health needs are our priority.  As part of our continuing mission to provide you with exceptional heart care, we have created designated Provider Care Teams.  These Care Teams include your primary Cardiologist (physician) and Advanced Practice Providers (APPs -  Physician Assistants and Nurse Practitioners) who all work together to provide you with the care you need, when you need it.  Your next appointment:   12 months  The format for your next appointment:   In Person  Provider:   Gregg Taylor, MD  Other Instructions Thank you for choosing [facility]!    

## 2019-05-03 NOTE — Progress Notes (Signed)
HPI [name] returns today for ongoing followup of sinus node dysfunction, s/pBiotronikPPM insertion. She is a pleasant 68yo woman with symptomatic bradycardia who underwent PPM insertion about 2 years ago. 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 No chest pain or sob. She admits to being more sedentary. No syncope. Allergies  Allergen Reactions  . Imipramine Hives and Rash  . Tricor [Fenofibrate] Other (See Comments)    Pain, "aching"     Current Outpatient Medications  Medication Sig Dispense Refill  . acetaminophen (TYLENOL 8 HOUR) 650 MG CR tablet Take 650-1,300 mg by mouth See admin instructions. 1300MG  IN THE MORNING AND 650MG  AT BEDTIME    . ALPRAZolam (XANAX XR) 1 MG 24 hr tablet Take 1 mg by mouth every morning.     [name] ALPRAZolam (XANAX) 1 MG tablet Take 0.5 mg by mouth daily as needed for anxiety.     . calcium carbonate (TUMS - DOSED IN MG ELEMENTAL CALCIUM) 500 MG chewable tablet Chew 1-3 tablets by mouth daily as needed for indigestion or heartburn.    [name] glucosamine-chondroitin 500-400 MG tablet Take 2 tablets by mouth every morning.     . lamoTRIgine (LAMICTAL) 200 MG tablet Take 400 mg by mouth at bedtime.     . naproxen sodium (ALEVE) 220 MG tablet Take 220 mg by mouth daily as needed (for pain).    . QUEtiapine (SEROQUEL) 200 MG tablet Take 400 mg by mouth at bedtime.     . tizanidine (ZANAFLEX) 2 MG capsule Take 1 capsule (2 mg total) by mouth at bedtime as needed for muscle spasms. 15 capsule 0  . Vitamin D, Ergocalciferol, (DRISDOL) 50000 units CAPS capsule Take 50,000 Units by mouth every Sunday.      No current facility-administered medications for this visit.      Past Medical History:  Diagnosis Date  . Anxiety   . Bipolar 1 disorder ([place])   . 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 Breast cancer, left breast ([place]) 07/22/2011  . Cancer ([place])   . Depression   . High cholesterol   . Hypertension   .  IBS (irritable bowel syndrome)   . Impaired fasting glucose   . Memory loss   . OA (osteoarthritis)   . Obesity   . Personal history of chemotherapy 2009  . Personal history of radiation therapy 2009  . Polycystic ovarian disease   . 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 Sleep apnea     ROS:   All systems reviewed and negative except as noted in the HPI.   Past Surgical History:  Procedure Laterality Date  . ABDOMINAL HYSTERECTOMY    . APPENDECTOMY    . BREAST BIOPSY Left 2011  . BREAST BIOPSY  2008  . BREAST LUMPECTOMY Left 2008  . CHOLECYSTECTOMY    . COLONOSCOPY N/A 09/13/2013   Procedure: COLONOSCOPY;  Surgeon: [name], MD;  [facility];  Service: Endoscopy;  Laterality: N/A;  730  . EP IMPLANTABLE DEVICE N/A 01/06/2016   Procedure: Pacemaker Implant;  Surgeon: [name], MD;  Location: [facility];  Service: Cardiovascular;  Laterality: N/A;  . ESOPHAGOGASTRODUODENOSCOPY N/A 01/17/2014   Procedure: ESOPHAGOGASTRODUODENOSCOPY (EGD);  Surgeon: [name], MD;  [facility];  Service: Endoscopy;  Laterality: N/A;  245  . TOTAL KNEE ARTHROPLASTY Bilateral right knee   2012, 2007     Family History  Problem Relation Age of Onset  . Bipolar disorder Mother   . Diabetes Mother   . Bipolar disorder Sister   . Diabetes Sister   . Alcohol abuse Father   . Hypertension Father   . Heart disease Other      Social History   Socioeconomic History  . Marital status: Married    Spouse name: Not on file  . Number of children: 3  . Years of education: college  . Highest education level: Associate degree: occupational, Hotel manager, or vocational program  Occupational History  . Occupation: Therapist, sports  Social Needs  . Financial resource strain: Not on file  . Food insecurity    Worry: Not on file    Inability: Not on file  . Transportation needs    Medical: Not on file    Non-medical: Not on file  Tobacco Use  .  Smoking status: Never Smoker  . Smokeless tobacco: Never Used  Substance and Sexual Activity  . Alcohol use: No    Alcohol/week: 0.0 standard drinks  . Drug use: No  . Sexual activity: Never  Lifestyle  . Physical activity    Days per week: Not on file    Minutes per session: Not on file  . Stress: Not on file  Relationships  . Social Herbalist on phone: Not on file    Gets together: Not on file    Attends religious service: Not on file    Active member of club or organization: Not on file    Attends meetings of clubs or organizations: Not on file    Relationship status: Not on file  . Intimate partner violence    Fear of current or ex partner: Not on file    Emotionally abused: Not on file    Physically abused: Not on file    Forced sexual activity: Not on file  Other Topics Concern  . Not on file  Social History Narrative   04/24/2013 [name] was born and grew up in [place]. She reports that her childhood was "tough." She has 2 older sisters and a younger brother. She achieved an Associates Degree in nursing. 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 She has 2 daughters and one son. She denies any legal difficulties. She affiliates as Engineer, manufacturing. Her hobbies include reading, sewing, crafts, and cooking. She reports that her social support system consists of her friend and passed her. 04/24/2013 [name]      Lives alone.   Right-handed.   No caffeine use.     BP (!) 147/76   Pulse 77   Temp 97.8 F (36.6 C)   Ht 5\' 4"  (1.626 m)   Wt 276 lb (125.2 kg)   SpO2 95%   BMI 47.38 kg/m   Physical Exam:  Well appearing NAD HEENT: Unremarkable Neck:  No JVD, no thyromegally Lymphatics:  No adenopathy Back:  No CVA tenderness Lungs:  Clear HEART:  Regular rate rhythm, no murmurs, no rubs, no clicks Abd:  soft, positive bowel sounds, no organomegally, no rebound, no guarding Ext:  2 plus pulses, no edema,  no cyanosis, no clubbing Skin:  No rashes no nodules Neuro:  CN II through XII intact, motor grossly intact  DEVICE  Normal device function.  See PaceArt for details.   Assess/Plan: 1. Sinus node dysfunction - she has no escape and is paced at 30/min today.  2. PPM -  Her Biotronik DDD PM is working normally.  3. Obesity - I strongly encouraged her to lose weight. We discussed the various diet options.  4. HTN - her SBP is up a bit. I asked her to reduce her salt intake and exercise.  Mikle Bosworth.D.

## 2019-05-16 DIAGNOSIS — F4323 Adjustment disorder with mixed anxiety and depressed mood: Secondary | ICD-10-CM | POA: Diagnosis not present

## 2019-05-30 DIAGNOSIS — F4323 Adjustment disorder with mixed anxiety and depressed mood: Secondary | ICD-10-CM | POA: Diagnosis not present

## 2019-06-13 DIAGNOSIS — F4323 Adjustment disorder with mixed anxiety and depressed mood: Secondary | ICD-10-CM | POA: Diagnosis not present

## 2019-07-05 ENCOUNTER — Ambulatory Visit (INDEPENDENT_AMBULATORY_CARE_PROVIDER_SITE_OTHER): Payer: PPO | Admitting: *Deleted

## 2019-07-05 DIAGNOSIS — R001 Bradycardia, unspecified: Secondary | ICD-10-CM

## 2019-07-05 LAB — CUP PACEART REMOTE DEVICE CHECK
Battery Remaining Percentage: 75 %
Brady Statistic RA Percent Paced: 86 %
Brady Statistic RV Percent Paced: 1 %
Date Time Interrogation Session: 20201223191835
Implantable Lead Implant Date: 20170627
Implantable Lead Implant Date: 20170627
Implantable Lead Location: 753859
Implantable Lead Location: 753860
Implantable Lead Model: 377
Implantable Lead Model: 377
Implantable Lead Serial Number: 49436227
Implantable Lead Serial Number: 49471106
Implantable Pulse Generator Implant Date: 20170627
Lead Channel Impedance Value: 488 Ohm
Lead Channel Impedance Value: 546 Ohm
Lead Channel Pacing Threshold Amplitude: 1 V
Lead Channel Pacing Threshold Amplitude: 1.3 V
Lead Channel Pacing Threshold Pulse Width: 0.4 ms
Lead Channel Pacing Threshold Pulse Width: 0.4 ms
Lead Channel Sensing Intrinsic Amplitude: 1.7 mV
Lead Channel Sensing Intrinsic Amplitude: 9.7 mV
Lead Channel Setting Pacing Amplitude: 2 V
Lead Channel Setting Pacing Amplitude: 2.4 V
Lead Channel Setting Pacing Pulse Width: 0.4 ms
Pulse Gen Model: 394969
Pulse Gen Serial Number: 68786455

## 2019-07-07 NOTE — Progress Notes (Signed)
PPM remote 

## 2019-07-19 DIAGNOSIS — F4323 Adjustment disorder with mixed anxiety and depressed mood: Secondary | ICD-10-CM | POA: Diagnosis not present

## 2019-08-02 DIAGNOSIS — F4323 Adjustment disorder with mixed anxiety and depressed mood: Secondary | ICD-10-CM | POA: Diagnosis not present

## 2019-09-12 DIAGNOSIS — F4323 Adjustment disorder with mixed anxiety and depressed mood: Secondary | ICD-10-CM | POA: Diagnosis not present

## 2019-09-26 DIAGNOSIS — F4323 Adjustment disorder with mixed anxiety and depressed mood: Secondary | ICD-10-CM | POA: Diagnosis not present

## 2019-10-04 ENCOUNTER — Ambulatory Visit (INDEPENDENT_AMBULATORY_CARE_PROVIDER_SITE_OTHER): Payer: PPO | Admitting: *Deleted

## 2019-10-04 DIAGNOSIS — R001 Bradycardia, unspecified: Secondary | ICD-10-CM | POA: Diagnosis not present

## 2019-10-04 LAB — CUP PACEART REMOTE DEVICE CHECK
Battery Remaining Percentage: 75 %
Brady Statistic RA Percent Paced: 60 %
Brady Statistic RV Percent Paced: 1 %
Date Time Interrogation Session: 20210325064112
Implantable Lead Implant Date: 20170627
Implantable Lead Implant Date: 20170627
Implantable Lead Location: 753859
Implantable Lead Location: 753860
Implantable Lead Model: 377
Implantable Lead Model: 377
Implantable Lead Serial Number: 49436227
Implantable Lead Serial Number: 49471106
Implantable Pulse Generator Implant Date: 20170627
Lead Channel Impedance Value: 449 Ohm
Lead Channel Impedance Value: 527 Ohm
Lead Channel Pacing Threshold Amplitude: 1.4 V
Lead Channel Pacing Threshold Pulse Width: 0.4 ms
Lead Channel Sensing Intrinsic Amplitude: 3.3 mV
Lead Channel Sensing Intrinsic Amplitude: 9.4 mV
Lead Channel Setting Pacing Amplitude: 2 V
Lead Channel Setting Pacing Amplitude: 2.4 V
Lead Channel Setting Pacing Pulse Width: 0.4 ms
Pulse Gen Model: 394969
Pulse Gen Serial Number: 68786455

## 2019-10-04 NOTE — Progress Notes (Signed)
PPM Remote  

## 2019-10-10 DIAGNOSIS — F4323 Adjustment disorder with mixed anxiety and depressed mood: Secondary | ICD-10-CM | POA: Diagnosis not present

## 2019-10-19 DIAGNOSIS — F4323 Adjustment disorder with mixed anxiety and depressed mood: Secondary | ICD-10-CM | POA: Diagnosis not present

## 2019-10-26 DIAGNOSIS — F4323 Adjustment disorder with mixed anxiety and depressed mood: Secondary | ICD-10-CM | POA: Diagnosis not present

## 2019-11-09 DIAGNOSIS — F4323 Adjustment disorder with mixed anxiety and depressed mood: Secondary | ICD-10-CM | POA: Diagnosis not present

## 2019-11-29 DIAGNOSIS — F4323 Adjustment disorder with mixed anxiety and depressed mood: Secondary | ICD-10-CM | POA: Diagnosis not present

## 2019-12-04 ENCOUNTER — Encounter (HOSPITAL_COMMUNITY): Payer: Self-pay

## 2019-12-04 ENCOUNTER — Observation Stay (HOSPITAL_COMMUNITY)
Admission: EM | Admit: 2019-12-04 | Discharge: 2019-12-06 | Disposition: A | Discharge status: Home / Self Care | Payer: PPO | Attending: Internal Medicine | Admitting: Internal Medicine

## 2019-12-04 ENCOUNTER — Emergency Department (HOSPITAL_COMMUNITY): Payer: PPO

## 2019-12-04 ENCOUNTER — Ambulatory Visit (INDEPENDENT_AMBULATORY_CARE_PROVIDER_SITE_OTHER)
Admission: EM | Admit: 2019-12-04 | Discharge: 2019-12-04 | Disposition: A | Discharge status: Home / Self Care | Payer: PPO | Source: Home / Self Care

## 2019-12-04 ENCOUNTER — Other Ambulatory Visit: Payer: Self-pay

## 2019-12-04 DIAGNOSIS — I10 Essential (primary) hypertension: Secondary | ICD-10-CM | POA: Diagnosis not present

## 2019-12-04 DIAGNOSIS — Z96653 Presence of artificial knee joint, bilateral: Secondary | ICD-10-CM | POA: Insufficient documentation

## 2019-12-04 DIAGNOSIS — F319 Bipolar disorder, unspecified: Secondary | ICD-10-CM | POA: Diagnosis not present

## 2019-12-04 DIAGNOSIS — Z950 Presence of cardiac pacemaker: Secondary | ICD-10-CM | POA: Diagnosis not present

## 2019-12-04 DIAGNOSIS — Z853 Personal history of malignant neoplasm of breast: Secondary | ICD-10-CM | POA: Insufficient documentation

## 2019-12-04 DIAGNOSIS — R0600 Dyspnea, unspecified: Secondary | ICD-10-CM | POA: Diagnosis not present

## 2019-12-04 DIAGNOSIS — I110 Hypertensive heart disease with heart failure: Secondary | ICD-10-CM | POA: Diagnosis not present

## 2019-12-04 DIAGNOSIS — Z20822 Contact with and (suspected) exposure to covid-19: Secondary | ICD-10-CM | POA: Diagnosis not present

## 2019-12-04 DIAGNOSIS — R0609 Other forms of dyspnea: Secondary | ICD-10-CM | POA: Diagnosis present

## 2019-12-04 DIAGNOSIS — R0602 Shortness of breath: Secondary | ICD-10-CM

## 2019-12-04 DIAGNOSIS — I5031 Acute diastolic (congestive) heart failure: Secondary | ICD-10-CM | POA: Diagnosis not present

## 2019-12-04 DIAGNOSIS — I509 Heart failure, unspecified: Secondary | ICD-10-CM

## 2019-12-04 DIAGNOSIS — I5032 Chronic diastolic (congestive) heart failure: Secondary | ICD-10-CM | POA: Diagnosis present

## 2019-12-04 LAB — CBC WITH DIFFERENTIAL/PLATELET
Abs Immature Granulocytes: 0.03 10*3/uL (ref 0.00–0.07)
Basophils Absolute: 0.1 10*3/uL (ref 0.0–0.1)
Basophils Relative: 1 %
Eosinophils Absolute: 0.1 10*3/uL (ref 0.0–0.5)
Eosinophils Relative: 1 %
HCT: 43.8 % (ref 36.0–46.0)
Hemoglobin: 13.7 g/dL (ref 12.0–15.0)
Immature Granulocytes: 0 %
Lymphocytes Relative: 24 %
Lymphs Abs: 2.1 10*3/uL (ref 0.7–4.0)
MCH: 31.1 pg (ref 26.0–34.0)
MCHC: 31.3 g/dL (ref 30.0–36.0)
MCV: 99.3 fL (ref 80.0–100.0)
Monocytes Absolute: 0.8 10*3/uL (ref 0.1–1.0)
Monocytes Relative: 9 %
Neutro Abs: 5.8 10*3/uL (ref 1.7–7.7)
Neutrophils Relative %: 65 %
Platelets: 260 10*3/uL (ref 150–400)
RBC: 4.41 MIL/uL (ref 3.87–5.11)
RDW: 13.2 % (ref 11.5–15.5)
WBC: 8.8 10*3/uL (ref 4.0–10.5)
nRBC: 0 % (ref 0.0–0.2)

## 2019-12-04 MED ORDER — ACETAMINOPHEN 325 MG PO TABS
650.0000 mg | ORAL_TABLET | Freq: Once | ORAL | Status: AC
  Administered 2019-12-05: 650 mg via ORAL
  Filled 2019-12-04: qty 2

## 2019-12-04 MED ORDER — NITROGLYCERIN 2 % TD OINT
1.0000 [in_us] | TOPICAL_OINTMENT | Freq: Once | TRANSDERMAL | Status: AC
  Administered 2019-12-05: 1 [in_us] via TOPICAL
  Filled 2019-12-04: qty 1

## 2019-12-04 MED ORDER — FUROSEMIDE 10 MG/ML IJ SOLN
60.0000 mg | Freq: Once | INTRAMUSCULAR | Status: AC
  Administered 2019-12-05: 60 mg via INTRAVENOUS
  Filled 2019-12-04: qty 6

## 2019-12-04 NOTE — ED Triage Notes (Addendum)
Pt presents with complaints of sudden shortness of breath that started this afternoon. Pt states that she has had wheezing over the last 2 months. Reports that the [facility] was on the elliptical and went to get on the treadmill but did not have any strength. Denies any pain. Pt has history of anxiety but states this feels different.

## 2019-12-04 NOTE — ED Provider Notes (Addendum)
[facility] EMERGENCY DEPARTMENT Provider Note   CSN: [id number] Arrival date & time: 12/04/19  1959   Time seen 11:07 PM  History Chief Complaint  Patient presents with  . Shortness of Breath    [name] is a 69 y.o. female.  HPI   Patient states for couple months she has noted when she walks fast she gets a little short of breath.  She states today about 3:30 PM [facility] and then [facility] was going to do the treadmill but she was just too weak.  [facility] [employer] and went into a store and she got short of breath and had to go back out and sit in the car.  She denies chest pain, diaphoresis, nausea, vomiting, or swelling of her legs.  She denies having breathing problems in the past however the nurses notes document she has been having wheezing for couple of months.  She states sometimes her chest feels tight.  She states last week she was started on Vraylar for her bipolar and it made her feel like she was having the flu for 4 days and made her feel very dizzy and lightheaded so she has stopped taking it.  She states she also has a pacemaker for the past 3 years due to complete heart block.  Patient denies any prior cigarette use.  PCP Kathyrn Drown, MD Cardiologist Dr. Lovena Le  Past Medical History:  Diagnosis Date  . Anxiety   . Bipolar 1 disorder ([place])   . 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 Breast cancer, left breast ([place]) 07/22/2011  . Cancer ([place])   . Depression   . High cholesterol   . Hypertension   . IBS (irritable bowel syndrome)   . Impaired fasting glucose   . Memory loss   . OA (osteoarthritis)   . Obesity   . Personal history of chemotherapy 2009  . Personal history of radiation therapy 2009  . Polycystic ovarian disease   . 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 Sleep apnea     Patient Active Problem List   Diagnosis Date Noted  . Dyspnea 12/05/2019  . Memory  loss 12/12/2017  . Morbid obesity ([place]) 09/05/2017  . Sinus node dysfunction ([place]) 01/06/2016  . Bradycardia 10/22/2015  . Bipolar disorder ([place]) 10/22/2015  . Anxiety 10/22/2015  . Pre-syncope 10/22/2015  . Episodic lightheadedness   . GERD (gastroesophageal reflux disease) 01/08/2014  . Hypertension 11/28/2013  . Palpitations 11/28/2013  . Osteoarthritis 11/08/2013  . Lumbar back pain 11/08/2013  . Hypertriglyceridemia 08/06/2013  . Depression 04/20/2013  . Breast cancer, left breast ([place]) 07/22/2011    Past Surgical History:  Procedure Laterality Date  . ABDOMINAL HYSTERECTOMY    . APPENDECTOMY    . BREAST BIOPSY Left 2011  . BREAST BIOPSY  2008  . BREAST LUMPECTOMY Left 2008  . CHOLECYSTECTOMY    . COLONOSCOPY N/A 09/13/2013   Procedure: COLONOSCOPY;  Surgeon: [name], MD;  [facility];  Service: Endoscopy;  Laterality: N/A;  730  . EP IMPLANTABLE DEVICE N/A 01/06/2016   Procedure: Pacemaker Implant;  Surgeon: [name], MD;  Location: [place] CV LAB;  Service: Cardiovascular;  Laterality: N/A;  . ESOPHAGOGASTRODUODENOSCOPY N/A 01/17/2014   Procedure: ESOPHAGOGASTRODUODENOSCOPY (EGD);  Surgeon: [name], MD;  Location: [facility];  Service: Endoscopy;  Laterality: N/A;  245  . TOTAL KNEE ARTHROPLASTY  Bilateral right knee   2012, 2007     OB History   No obstetric history on file.     Family History  Problem Relation Age of Onset  . Bipolar disorder Mother   . Diabetes Mother   . Bipolar disorder Sister   . Diabetes Sister   . Alcohol abuse Father   . Hypertension Father   . Heart disease Other     Social History   Tobacco Use  . Smoking status: Never Smoker  . Smokeless tobacco: Never Used  Substance Use Topics  . Alcohol use: No    Alcohol/week: 0.0 standard drinks  . Drug use: No  retired Marine scientist  Home Medications Prior to Admission medications   Medication Sig Start Date End Date Taking? Authorizing Provider    acetaminophen (TYLENOL 8 HOUR) 650 MG CR tablet Take 650-1,300 mg by mouth See admin instructions. 1300MG  IN THE MORNING AND 650MG  AT BEDTIME    [provider]  ALPRAZolam (XANAX XR) 1 MG 24 hr tablet Take 1 mg by mouth every morning.     [provider]  ALPRAZolam [name]) 1 MG tablet Take 0.5 mg by mouth daily as needed for anxiety.     [provider]  calcium carbonate (TUMS - DOSED IN MG ELEMENTAL CALCIUM) 500 MG chewable tablet Chew 1-3 tablets by mouth daily as needed for indigestion or heartburn.    [provider]  glucosamine-chondroitin 500-400 MG tablet Take 2 tablets by mouth every morning.     [provider]  lamoTRIgine (LAMICTAL) 200 MG tablet Take 400 mg by mouth at bedtime.     [provider]  naproxen sodium (ALEVE) 220 MG tablet Take 220 mg by mouth daily as needed (for pain).    [provider]  QUEtiapine (SEROQUEL) 200 MG tablet Take 400 mg by mouth at bedtime.  05/07/13   [name], MD  tizanidine (ZANAFLEX) 2 MG capsule Take 1 capsule (2 mg total) by mouth at bedtime as needed for muscle spasms. 03/06/19   [place], [place], PA-C  Vitamin D, Ergocalciferol, (DRISDOL) 50000 units CAPS capsule Take 50,000 Units by mouth every Sunday.     [provider]    Allergies    Imipramine and Tricor [fenofibrate]  Review of Systems   Review of Systems  All other systems reviewed and are negative.   Physical Exam Updated Vital Signs BP (!) 175/79   Pulse 65   Temp 98.4 F (36.9 C) (Oral)   Resp (!) 25   Ht 5\' 4"  (1.626 m)   Wt 129.3 kg   SpO2 94%   BMI 48.92 kg/m   Physical Exam Vitals and nursing note reviewed.  Constitutional:      Appearance: Normal appearance. She is obese.  HENT:     Head: Normocephalic and atraumatic.     Right Ear: External ear normal.     Left Ear: External ear normal.     Nose: Nose normal.     Mouth/Throat:     Mouth: Mucous membranes are moist.   Eyes:     Extraocular Movements: Extraocular movements intact.     Conjunctiva/sclera: Conjunctivae normal.     Pupils: Pupils are equal, round, and reactive to light.  Cardiovascular:     Rate and Rhythm: Normal rate and regular rhythm.  Pulmonary:     Effort: Pulmonary effort is normal. No respiratory distress.  Abdominal:     General: There is no distension.  Palpations: Abdomen is soft.     Tenderness: There is no abdominal tenderness.  Musculoskeletal:        General: Normal range of motion.     Cervical back: Normal range of motion.     Right lower leg: No edema.     Left lower leg: No edema.  Skin:    General: Skin is warm and dry.  Neurological:     General: No focal deficit present.     Mental Status: She is alert and oriented to person, place, and time.     Cranial Nerves: No cranial nerve deficit.  Psychiatric:        Mood and Affect: Mood normal.        Behavior: Behavior normal.        Thought Content: Thought content normal.     ED Results / Procedures / Treatments   Labs (all labs ordered are listed, but only abnormal results are displayed) Results for orders placed or performed during the hospital encounter of 12/04/19  SARS Coronavirus 2 by RT PCR (hospital order, performed in [place] hospital lab) Nasopharyngeal Nasopharyngeal Swab   Specimen: Nasopharyngeal Swab  Result Value Ref Range   SARS Coronavirus 2 NEGATIVE NEGATIVE  Comprehensive metabolic panel  Result Value Ref Range   Sodium 138 135 - 145 mmol/L   Potassium 4.0 3.5 - 5.1 mmol/L   Chloride 101 98 - 111 mmol/L   CO2 27 22 - 32 mmol/L   Glucose, Bld 105 (H) 70 - 99 mg/dL   BUN 17 8 - 23 mg/dL   Creatinine, Ser 0.57 0.44 - 1.00 mg/dL   Calcium 9.3 8.9 - 10.3 mg/dL   Total Protein 7.5 6.5 - 8.1 g/dL   Albumin 4.3 3.5 - 5.0 g/dL   AST 23 15 - 41 U/L   ALT 22 0 - 44 U/L   Alkaline Phosphatase 98 38 - 126 U/L   Total Bilirubin 0.3 0.3 - 1.2 mg/dL   GFR calc non Af Amer >60 >60  mL/min   GFR calc Af Amer >60 >60 mL/min   Anion gap 10 5 - 15  CBC with Differential  Result Value Ref Range   WBC 8.8 4.0 - 10.5 K/uL   RBC 4.41 3.87 - 5.11 MIL/uL   Hemoglobin 13.7 12.0 - 15.0 g/dL   HCT 43.8 36.0 - 46.0 %   MCV 99.3 80.0 - 100.0 fL   MCH 31.1 26.0 - 34.0 pg   MCHC 31.3 30.0 - 36.0 g/dL   RDW 13.2 11.5 - 15.5 %   Platelets 260 150 - 400 K/uL   nRBC 0.0 0.0 - 0.2 %   Neutrophils Relative % 65 %   Neutro Abs 5.8 1.7 - 7.7 K/uL   Lymphocytes Relative 24 %   Lymphs Abs 2.1 0.7 - 4.0 K/uL   Monocytes Relative 9 %   Monocytes Absolute 0.8 0.1 - 1.0 K/uL   Eosinophils Relative 1 %   Eosinophils Absolute 0.1 0.0 - 0.5 K/uL   Basophils Relative 1 %   Basophils Absolute 0.1 0.0 - 0.1 K/uL   Immature Granulocytes 0 %   Abs Immature Granulocytes 0.03 0.00 - 0.07 K/uL  Brain natriuretic peptide  Result Value Ref Range   B Natriuretic Peptide 46.0 0.0 - 100.0 pg/mL  Troponin I (High Sensitivity)  Result Value Ref Range   Troponin I (High Sensitivity) 7 <18 ng/L  Troponin I (High Sensitivity)  Result Value Ref Range   Troponin I (High  Sensitivity) 6 <18 ng/L   Laboratory interpretation all normal, negative delta troponin   EKG None   ED ECG REPORT   Date: 12/05/2019  Rate: 65  Rhythm: Atrial paced rhythm  QRS Axis: normal  Intervals: normal  ST/T Wave abnormalities: normal  Conduction Disutrbances:LVH  Narrative Interpretation:   Old EKG Reviewed: none available  I have personally reviewed the EKG tracing and agree with the computerized printout as noted.   Radiology DG Chest Port 1 View  Result Date: 12/04/2019 CLINICAL DATA:  SHORTNESS OF BREATH AND GENERALIZED WEAKNESS EXAM: PORTABLE CHEST 1 VIEW COMPARISON:  Radiograph 02/02/2016, CT 06/16/2007 FINDINGS: Low volumes with some basilar atelectatic change. There is increasing interstitial opacities throughout both lungs with a basilar and perihilar predominance. Some mild septal and fissural  thickening is noted as well. The pulmonary vascularity is congested and indistinct. Cardiac silhouette is enlarged from prior portable radiograph. Pacer pack overlies left chest wall with leads in the right atrium and cardiac apex in similar position to comparison. No pneumothorax. No visible effusion at this time. No acute osseous or soft tissue abnormality. Degenerative changes are present in the imaged spine and shoulders. Dextrocurvature of the spine similar to prior. IMPRESSION: Cardiomegaly with increasing interstitial opacities throughout both lungs with a basilar and perihilar predominance. Findings likely represent CHF/volume overload with some developing interstitial pulmonary edema. Electronically Signed   By: Lovena Le M.D.   On: 12/04/2019 22:39    Procedures Procedures (including critical care time)  Medications Ordered in ED Medications  enoxaparin (LOVENOX) injection 40 mg (has no administration in time range)  acetaminophen (TYLENOL) tablet 650 mg (has no administration in time range)    Or  acetaminophen (TYLENOL) suppository 650 mg (has no administration in time range)  furosemide (LASIX) injection 60 mg (60 mg Intravenous Given 12/05/19 0008)  nitroGLYCERIN (NITROGLYN) 2 % ointment 1 inch (1 inch Topical Given 12/05/19 0007)  acetaminophen (TYLENOL) tablet 650 mg (650 mg Oral Given 12/05/19 0006)    ED Course  I have reviewed the triage vital signs and the nursing notes.  Pertinent labs & imaging results that were available during my care of the patient were reviewed by me and considered in my medical decision making (see chart for details).    MDM Rules/Calculators/A&P                      We discussed her x-ray result.  Laboratory testing was ordered.  IV was inserted.  She was given Lasix IV.  Patient's blood pressure is elevated at 150/92.  Nitroglycerin paste was placed on her chest.  Review her chart shows she was last seen by Dr. Lovena Le, cardiologist on May 03, 2019.  She has a Biotronik PPM pacemaker and he states she had symptomatic bradycardia.  Her pacemaker was checked at that time and had normal device function.  We discussed her test results which were all pretty normal.  I suspect part of this congestive heart failure seen on x-ray may be related to her uncontrolled hypertension.  Patient states she is having some urinary output after the Lasix.  I am going to have nurses recheck her blood pressure again.  With the nitroglycerin her blood pressure is 161/84.  12:59 AM patient was discussed with Dr. Blossom Hoops, cardiology fellow at [facility].  He states that if the patient is very comfortable she can go home on losartan 25 mg once a day and Lasix 20 mg a day and he  will arrange to have her seen in the office and get another echocardiogram.  She did have an echocardiogram in April 2017 which showed some findings consistent with LVH but her ejection fraction was 60 to 65%.  I am going to have nursing staff ambulate patient and make sure she does not become hypoxic or get more short of breath.  When nursing staff ambulated patient they describe that she tolerated it poorly and had to stop several times due to shortness of breath.  Her oxygen levels did drop to 91% and her heart rate increased to 120.  I discussed with the patient is probably be best if she stayed in the hospital tonight she is agreeable.  2:06 AM Dr Olevia Bowens, hospitalist will admit   Patient states she had the 2 Moderna vaccines  Final Clinical Impression(s) / ED Diagnoses Final diagnoses:  Essential hypertension  Acute congestive heart failure, unspecified heart failure type [facility])  Shortness of breath    Rx / DC Orders   Plan admission  Rolland Porter, MD, Barbette Or, MD 12/05/19 Massie Kluver    Rolland Porter, MD 12/05/19 [id number]

## 2019-12-04 NOTE — ED Triage Notes (Signed)
Patient is being discharged from the Urgent Care and sent to the Emergency Department via personal vehicle, pt refused ems transport. Per brittany wurst pa, patient is in need of higher level of care due to new sudden onset shortness of breath. Patient is aware and verbalizes understanding of plan of care.  Vitals:   12/04/19 1933  BP: (!) 175/92  Pulse: 67  Resp: (!) 22  Temp: 97.9 F (36.6 C)  SpO2: 96%

## 2019-12-04 NOTE — ED Triage Notes (Signed)
Pt presents to ED with complaints of SOB and generalized weakness started today at 1530. Pt denies cough and fever.

## 2019-12-05 ENCOUNTER — Observation Stay (HOSPITAL_BASED_OUTPATIENT_CLINIC_OR_DEPARTMENT_OTHER): Payer: PPO

## 2019-12-05 ENCOUNTER — Encounter (HOSPITAL_COMMUNITY): Payer: Self-pay | Admitting: Internal Medicine

## 2019-12-05 DIAGNOSIS — R0602 Shortness of breath: Secondary | ICD-10-CM

## 2019-12-05 DIAGNOSIS — I5031 Acute diastolic (congestive) heart failure: Secondary | ICD-10-CM

## 2019-12-05 DIAGNOSIS — R0609 Other forms of dyspnea: Secondary | ICD-10-CM | POA: Diagnosis present

## 2019-12-05 DIAGNOSIS — F3170 Bipolar disorder, currently in remission, most recent episode unspecified: Secondary | ICD-10-CM

## 2019-12-05 DIAGNOSIS — I5032 Chronic diastolic (congestive) heart failure: Secondary | ICD-10-CM | POA: Diagnosis present

## 2019-12-05 DIAGNOSIS — I10 Essential (primary) hypertension: Secondary | ICD-10-CM

## 2019-12-05 LAB — COMPREHENSIVE METABOLIC PANEL
ALT: 22 U/L (ref 0–44)
AST: 23 U/L (ref 15–41)
Albumin: 4.3 g/dL (ref 3.5–5.0)
Alkaline Phosphatase: 98 U/L (ref 38–126)
Anion gap: 10 (ref 5–15)
BUN: 17 mg/dL (ref 8–23)
CO2: 27 mmol/L (ref 22–32)
Calcium: 9.3 mg/dL (ref 8.9–10.3)
Chloride: 101 mmol/L (ref 98–111)
Creatinine, Ser: 0.57 mg/dL (ref 0.44–1.00)
GFR calc Af Amer: >60 mL/min (ref >60)
GFR calc non Af Amer: >60 mL/min (ref >60)
Glucose, Bld: 105 mg/dL — ABNORMAL HIGH (ref 70–99)
Potassium: 4 mmol/L (ref 3.5–5.1)
Sodium: 138 mmol/L (ref 135–145)
Total Bilirubin: 0.3 mg/dL (ref 0.3–1.2)
Total Protein: 7.5 g/dL (ref 6.5–8.1)

## 2019-12-05 LAB — TROPONIN I (HIGH SENSITIVITY)
Troponin I (High Sensitivity): 7 ng/L (ref <18)
Troponin I (High Sensitivity): 6 ng/L (ref <18)
Troponin I (High Sensitivity): 5 ng/L (ref <18)
Troponin I (High Sensitivity): 5 ng/L (ref <18)

## 2019-12-05 LAB — ECHOCARDIOGRAM COMPLETE
Height: 64 in
Weight: 4560 oz

## 2019-12-05 LAB — SARS CORONAVIRUS 2 BY RT PCR (HOSPITAL ORDER, PERFORMED IN ~~LOC~~ HOSPITAL LAB): SARS Coronavirus 2: NEGATIVE

## 2019-12-05 LAB — D-DIMER, QUANTITATIVE: D-Dimer, Quant: 0.49 ug/mL-FEU (ref 0.00–0.50)

## 2019-12-05 LAB — BRAIN NATRIURETIC PEPTIDE: B Natriuretic Peptide: 46 pg/mL (ref 0.0–100.0)

## 2019-12-05 MED ORDER — LOSARTAN POTASSIUM 50 MG PO TABS
25.0000 mg | ORAL_TABLET | Freq: Every day | ORAL | Status: DC
  Administered 2019-12-05: 25 mg via ORAL
  Filled 2019-12-05: qty 1

## 2019-12-05 MED ORDER — ACETAMINOPHEN 650 MG RE SUPP: 650.0000 mg | Freq: Four times a day (QID) | RECTAL | Status: DC | PRN

## 2019-12-05 MED ORDER — QUETIAPINE FUMARATE 100 MG PO TABS
500.0000 mg | ORAL_TABLET | Freq: Every day | ORAL | Status: DC
  Administered 2019-12-05: 500 mg via ORAL
  Filled 2019-12-05: qty 5

## 2019-12-05 MED ORDER — SODIUM CHLORIDE 0.9% FLUSH
3.0000 mL | Freq: Two times a day (BID) | INTRAVENOUS | Status: DC
  Administered 2019-12-05 – 2019-12-06 (×2): 3 mL via INTRAVENOUS

## 2019-12-05 MED ORDER — SODIUM CHLORIDE 0.9 % IV SOLN: 250.0000 mL | INTRAVENOUS | Status: DC | PRN

## 2019-12-05 MED ORDER — SODIUM CHLORIDE 0.9% FLUSH: 3.0000 mL | INTRAVENOUS | Status: DC | PRN

## 2019-12-05 MED ORDER — ACETAMINOPHEN 325 MG PO TABS: 650.0000 mg | ORAL_TABLET | ORAL | Status: DC | PRN

## 2019-12-05 MED ORDER — ACETAMINOPHEN 500 MG PO TABS
1000.0000 mg | ORAL_TABLET | Freq: Every day | ORAL | Status: DC
  Administered 2019-12-06: 1000 mg via ORAL
  Filled 2019-12-05: qty 2

## 2019-12-05 MED ORDER — ALPRAZOLAM ER 1 MG PO TB24: 1.0000 mg | ORAL_TABLET | Freq: Every morning | ORAL | Status: DC

## 2019-12-05 MED ORDER — ACETAMINOPHEN 325 MG PO TABS: 650.0000 mg | ORAL_TABLET | Freq: Four times a day (QID) | ORAL | Status: DC | PRN

## 2019-12-05 MED ORDER — ONDANSETRON HCL 4 MG/2ML IJ SOLN
4.0000 mg | Freq: Four times a day (QID) | INTRAMUSCULAR | Status: DC | PRN
  Administered 2019-12-06: 4 mg via INTRAVENOUS
  Filled 2019-12-05: qty 2

## 2019-12-05 MED ORDER — ENOXAPARIN SODIUM 40 MG/0.4ML ~~LOC~~ SOLN: 40.0000 mg | SUBCUTANEOUS | Status: DC

## 2019-12-05 MED ORDER — FUROSEMIDE 10 MG/ML IJ SOLN
40.0000 mg | Freq: Two times a day (BID) | INTRAMUSCULAR | Status: DC
  Administered 2019-12-05: 40 mg via INTRAVENOUS
  Filled 2019-12-05: qty 4

## 2019-12-05 MED ORDER — LAMOTRIGINE 100 MG PO TABS
400.0000 mg | ORAL_TABLET | Freq: Every day | ORAL | Status: DC
  Administered 2019-12-05: 400 mg via ORAL
  Filled 2019-12-05: qty 4

## 2019-12-05 MED ORDER — ALPRAZOLAM 1 MG PO TABS
1.0000 mg | ORAL_TABLET | Freq: Three times a day (TID) | ORAL | Status: DC | PRN
  Administered 2019-12-05 – 2019-12-06 (×2): 1 mg via ORAL
  Filled 2019-12-05 (×2): qty 1

## 2019-12-05 MED ORDER — ENOXAPARIN SODIUM 40 MG/0.4ML ~~LOC~~ SOLN
40.0000 mg | SUBCUTANEOUS | Status: DC
  Administered 2019-12-05 – 2019-12-06 (×2): 40 mg via SUBCUTANEOUS
  Filled 2019-12-05 (×2): qty 0.4

## 2019-12-05 MED ORDER — ACETAMINOPHEN 325 MG PO TABS
650.0000 mg | ORAL_TABLET | Freq: Every day | ORAL | Status: DC
  Administered 2019-12-05: 650 mg via ORAL
  Filled 2019-12-05: qty 2

## 2019-12-05 NOTE — H&P (Signed)
History and Physical    [name] [id number] DOB: 22-Jul-1950 DOA: 12/04/2019  PCP: Kathyrn Drown, MD  Patient coming from: Home  I have personally briefly reviewed patient's old medical records in [place]  Chief Complaint: Shortness of breath  HPI: [name] is a 69 y.o. female with medical history significant of sinus node dysfunction status post pacemaker, bipolar disorder, anxiety, was exercising at the [facility] when she began to develop shortness of breath.  Patient stopped her exercise routine, but reports that shortness of breath persisted through the rest of the day, even at rest.  She eventually went to the urgent care for evaluation where she was subsequently routed to the emergency room.  She denies any diaphoresis.  She did feel lightheaded with shortness of breath, but did not have any chest pain.  She is not had any fever or cough.  She has noted intermittent wheezing for the past several weeks.  She has not noticed any worsening peripheral edema.  ED Course: She was evaluated the emergency room where chest x-ray indicated possible developing interstitial edema.  BNP noted to be normal.  Cardiac enzymes also unrevealing.  EKG number with no acute changes.  Patient was ambulated in the emergency room and became short of breath and tachycardic.  It was thought that she may have some degree of pulmonary edema and was referred for admission.  Review of Systems: As per HPI otherwise 10 point review of systems negative.    Past Medical History:  Diagnosis Date  . Anxiety   . Bipolar 1 disorder ([place])   . 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 Breast cancer, left breast ([place]) 07/22/2011  . Cancer ([place])   . Depression   . High cholesterol   . Hypertension   . IBS (irritable bowel syndrome)   . Impaired fasting glucose   . Memory loss   . OA (osteoarthritis)   . Obesity   . Personal history of chemotherapy  2009  . Personal history of radiation therapy 2009  . Polycystic ovarian disease   . 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 Sleep apnea     Past Surgical History:  Procedure Laterality Date  . ABDOMINAL HYSTERECTOMY    . APPENDECTOMY    . BREAST BIOPSY Left 2011  . BREAST BIOPSY  2008  . BREAST LUMPECTOMY Left 2008  . CHOLECYSTECTOMY    . COLONOSCOPY N/A 09/13/2013   Procedure: COLONOSCOPY;  Surgeon: [name], MD;  [facility];  Service: Endoscopy;  Laterality: N/A;  730  . EP IMPLANTABLE DEVICE N/A 01/06/2016   Procedure: Pacemaker Implant;  Surgeon: [name], MD;  Location: [facility];  Service: Cardiovascular;  Laterality: N/A;  . ESOPHAGOGASTRODUODENOSCOPY N/A 01/17/2014   Procedure: ESOPHAGOGASTRODUODENOSCOPY (EGD);  Surgeon: [name], MD;  Location: [facility];  Service: Endoscopy;  Laterality: N/A;  245  . TOTAL KNEE ARTHROPLASTY Bilateral right knee   2012, 2007    Social History:  reports that she has never smoked. She has never used smokeless tobacco. She reports that she does not drink alcohol or use drugs.  Allergies  Allergen Reactions  . Imipramine Hives and Rash  . Tricor [Fenofibrate] Other (See Comments)    Pain, "aching"    Family History  Problem Relation Age of Onset  . Bipolar disorder Mother   . Diabetes Mother   . Bipolar disorder Sister   .  Diabetes Sister   . Alcohol abuse Father   . Hypertension Father   . Heart disease Other     Prior to Admission medications   Medication Sig Start Date End Date Taking? Authorizing Provider  acetaminophen (TYLENOL 8 HOUR) 650 MG CR tablet Take 650-1,300 mg by mouth See admin instructions. 1300MG  IN THE MORNING AND 650MG  AT BEDTIME   Yes [provider]  ALPRAZolam (XANAX XR) 1 MG 24 hr tablet Take 1 mg by mouth every morning.    Yes [provider]  ALPRAZolam [name]) 1 MG tablet Take 0.5 mg by mouth daily as needed for anxiety.    Yes  [provider]  calcium carbonate (TUMS - DOSED IN MG ELEMENTAL CALCIUM) 500 MG chewable tablet Chew 1-3 tablets by mouth daily as needed for indigestion or heartburn.   Yes [provider]  glucosamine-chondroitin 500-400 MG tablet Take 2 tablets by mouth every morning.    Yes [provider]  lamoTRIgine (LAMICTAL) 200 MG tablet Take 400 mg by mouth at bedtime.    Yes [provider]  QUEtiapine (SEROQUEL) 200 MG tablet Take 500 mg by mouth at bedtime.  05/07/13  Yes [name], MD  Vitamin D, Ergocalciferol, (DRISDOL) 50000 units CAPS capsule Take 50,000 Units by mouth every Sunday.    Yes [provider]    Physical Exam: Vitals:   12/05/19 0622 12/05/19 0623 12/05/19 1630 12/05/19 1939  BP:   (!) 144/69   Pulse: 66 66 72   Resp: (!) 22 16 18    Temp:   98.2 F (36.8 C)   TempSrc:   Oral   SpO2: 96% 96% 96% 96%  Weight:      Height:        Constitutional: NAD, calm, comfortable Eyes: PERRL, lids and conjunctivae normal ENMT: Mucous membranes are moist. Posterior pharynx clear of any exudate or lesions.Normal dentition.  Neck: normal, supple, no masses, no thyromegaly Respiratory: clear to auscultation bilaterally, no wheezing, no crackles. Normal respiratory effort. No accessory muscle use.  Cardiovascular: Regular rate and rhythm, no murmurs / rubs / gallops. No extremity edema. 2+ pedal pulses. No carotid bruits.  Abdomen: no tenderness, no masses palpated. No hepatosplenomegaly. Bowel sounds positive.  Musculoskeletal: no clubbing / cyanosis. No joint deformity upper and lower extremities. Good ROM, no contractures. Normal muscle tone.  Skin: no rashes, lesions, ulcers. No induration Neurologic: CN 2-12 grossly intact. Sensation intact, DTR normal. Strength 5/5 in all 4.  Psychiatric: Normal judgment and insight. Alert and oriented x 3. Normal mood.    Labs on Admission: I have personally reviewed following labs and  imaging studies  CBC: Recent Labs  Lab 12/04/19 2329  WBC 8.8  NEUTROABS 5.8  HGB 13.7  HCT 43.8  MCV 99.3  PLT 123456   Basic Metabolic Panel: Recent Labs  Lab 12/04/19 2329  NA 138  K 4.0  CL 101  CO2 27  GLUCOSE 105*  BUN 17  CREATININE 0.57  CALCIUM 9.3   GFR: Estimated Creatinine Clearance: 91 mL/min (by C-G formula based on SCr of 0.57 mg/dL). Liver Function Tests: Recent Labs  Lab 12/04/19 2329  AST 23  ALT 22  ALKPHOS 98  BILITOT 0.3  PROT 7.5  ALBUMIN 4.3   No results for input(s): LIPASE, AMYLASE in the last 168 hours. No results for input(s): AMMONIA in the last 168 hours. Coagulation Profile: No results for input(s): INR, PROTIME in the last 168 hours. Cardiac Enzymes: No results  for input(s): CKTOTAL, CKMB, CKMBINDEX, TROPONINI in the last 168 hours. BNP (last 3 results) No results for input(s): PROBNP in the last 8760 hours. HbA1C: No results for input(s): HGBA1C in the last 72 hours. CBG: No results for input(s): GLUCAP in the last 168 hours. Lipid Profile: No results for input(s): CHOL, HDL, LDLCALC, TRIG, CHOLHDL, LDLDIRECT in the last 72 hours. Thyroid Function Tests: No results for input(s): TSH, T4TOTAL, FREET4, T3FREE, THYROIDAB in the last 72 hours. Anemia Panel: No results for input(s): VITAMINB12, FOLATE, FERRITIN, TIBC, IRON, RETICCTPCT in the last 72 hours. Urine analysis:    Component Value Date/Time   COLORURINE YELLOW 10/23/2015 0555   APPEARANCEUR CLEAR 10/23/2015 0555   LABSPEC 1.010 10/23/2015 0555   PHURINE 5.5 10/23/2015 0555   GLUCOSEU NEGATIVE 10/23/2015 0555   HGBUR NEGATIVE 10/23/2015 0555   BILIRUBINUR NEGATIVE 10/23/2015 0555   KETONESUR NEGATIVE 10/23/2015 0555   PROTEINUR NEGATIVE 10/23/2015 0555   UROBILINOGEN 0.2 02/05/2011 0916   NITRITE NEGATIVE 10/23/2015 0555   LEUKOCYTESUR NEGATIVE 10/23/2015 0555    Radiological Exams on Admission: DG Chest Port 1 View  Result Date: 12/04/2019 CLINICAL DATA:   SHORTNESS OF BREATH AND GENERALIZED WEAKNESS EXAM: PORTABLE CHEST 1 VIEW COMPARISON:  Radiograph 02/02/2016, CT 06/16/2007 FINDINGS: Low volumes with some basilar atelectatic change. There is increasing interstitial opacities throughout both lungs with a basilar and perihilar predominance. Some mild septal and fissural thickening is noted as well. The pulmonary vascularity is congested and indistinct. Cardiac silhouette is enlarged from prior portable radiograph. Pacer pack overlies left chest wall with leads in the right atrium and cardiac apex in similar position to comparison. No pneumothorax. No visible effusion at this time. No acute osseous or soft tissue abnormality. Degenerative changes are present in the imaged spine and shoulders. Dextrocurvature of the spine similar to prior. IMPRESSION: Cardiomegaly with increasing interstitial opacities throughout both lungs with a basilar and perihilar predominance. Findings likely represent CHF/volume overload with some developing interstitial pulmonary edema. Electronically Signed   By: Lovena Le M.D.   On: 12/04/2019 22:39   ECHOCARDIOGRAM COMPLETE  Result Date: 12/05/2019    ECHOCARDIOGRAM REPORT   Patient Name:   [name] Date of Exam: 12/05/2019 Medical Rec #:  [id number]             Height:       64.0 in Accession #:    [id number]            Weight:       285.0 lb Date of Birth:  16-Apr-1951              BSA:          2.274 m Patient Age:    58 years              BP:           175/79 mmHg Patient Gender: F                     HR:           66 bpm. Exam Location:  [name] Procedure: 2D Echo Indications:    Dyspnea 786.09 / R06.00                 Abnormal ECG 794.31 / R94.31  History:        Patient has prior history of Echocardiogram examinations, most  recent 10/23/2015. Signs/Symptoms:Dyspnea; Risk                 Factors:Hypertension and Non-Smoker. Bradycardia, Breast cancer,                 left breast , Bipolar disorder ,  Sinus node dysfunction.  Sonographer:    Leavy Cella RDCS (AE) Referring Phys: [id number] DAVID MANUEL [place]  1. Left ventricular ejection fraction, by estimation, is 65 to 70%. The left ventricle has normal function. The left ventricle has no regional wall motion abnormalities. There is mild left ventricular hypertrophy. Left ventricular diastolic parameters are consistent with Grade I diastolic dysfunction (impaired relaxation).  2. Right ventricular systolic function is normal. The right ventricular size is normal. Tricuspid regurgitation signal is inadequate for assessing PA pressure.  3. The mitral valve is grossly normal. Trivial mitral valve regurgitation.  4. The aortic valve is tricuspid. Aortic valve regurgitation is not visualized.  5. The inferior vena cava is normal in size with greater than 50% respiratory variability, suggesting right atrial pressure of 3 mmHg. FINDINGS  Left Ventricle: Left ventricular ejection fraction, by estimation, is 65 to 70%. The left ventricle has normal function. The left ventricle has no regional wall motion abnormalities. The left ventricular internal cavity size was normal in size. There is  mild left ventricular hypertrophy. Left ventricular diastolic parameters are consistent with Grade I diastolic dysfunction (impaired relaxation). Right Ventricle: The right ventricular size is normal. No increase in right ventricular wall thickness. Right ventricular systolic function is normal. Tricuspid regurgitation signal is inadequate for assessing PA pressure. Left Atrium: Left atrial size was normal in size. Right Atrium: Right atrial size was normal in size. Pericardium: There is no evidence of pericardial effusion. Presence of pericardial fat pad. Mitral Valve: The mitral valve is grossly normal. Trivial mitral valve regurgitation. Tricuspid Valve: The tricuspid valve is grossly normal. Tricuspid valve regurgitation is trivial. Aortic Valve: The aortic valve is  tricuspid. Aortic valve regurgitation is not visualized. Mild aortic valve annular calcification. Pulmonic Valve: The pulmonic valve was not well visualized. Pulmonic valve regurgitation is not visualized. Aorta: The aortic root is normal in size and structure. Venous: The inferior vena cava is normal in size with greater than 50% respiratory variability, suggesting right atrial pressure of 3 mmHg. IAS/Shunts: No atrial level shunt detected by color flow Doppler.  LEFT VENTRICLE PLAX 2D LVIDd:         3.72 cm  Diastology LVIDs:         1.89 cm  LV e' lateral:   7.51 cm/s LV PW:         1.32 cm  LV E/e' lateral: 8.4 LV IVS:        1.06 cm  LV e' medial:    6.31 cm/s LVOT diam:     2.00 cm  LV E/e' medial:  10.0 LVOT Area:     3.14 cm  RIGHT VENTRICLE RV S prime:     14.50 cm/s TAPSE (M-mode): 1.8 cm LEFT ATRIUM           Index       RIGHT ATRIUM           Index LA diam:      3.30 cm 1.45 cm/m  RA Area:     11.40 cm LA Vol (A2C): 33.1 ml 14.55 ml/m RA Volume:   28.90 ml  12.71 ml/m LA Vol (A4C): 37.8 ml 16.62 ml/m   AORTA Ao Root diam: 2.80  cm MITRAL VALVE MV Area (PHT): 2.87 cm    SHUNTS MV Decel Time: 264 msec    Systemic Diam: 2.00 cm MV E velocity: 63.10 cm/s MV A velocity: 93.10 cm/s MV E/A ratio:  0.68 Rozann Lesches MD Electronically signed by Rozann Lesches MD Signature Date/Time: 12/05/2019/12:38:14 PM    Final     EKG: Independently reviewed.  No acute EKG changes  Assessment/Plan Active Problems:   Bipolar disorder (HCC)   Morbid obesity (HCC)   Dyspnea   Acute diastolic CHF (congestive heart failure) ([place])     1. Acute diastolic congestive heart failure.  BNP possibly false negative due to morbid obesity.  She received a dose of intravenous Lasix in the emergency room has had good urine output with some improvement of symptoms.  We will continue IV Lasix for now with plans to transition to p.o. in a.m. if continued improvement.  Echocardiogram done today shows ejection fraction of 65  to 70% with grade 1 diastolic dysfunction.  We will start her on low-dose losartan.  If she continues to improve, anticipate outpatient cardiology follow-up. 2. History of sinus node dysfunction status post pacemaker.  Continue to follow-up with Dr. Lovena Le as scheduled. 3. Morbid obesity.  Counseled on importance of diet and exercise.  I suspect that she may have some degree of sleep apnea which may be contributing to #1.  She will be referred for outpatient sleep study. 4. Hypertension.  She is not on any chronic antihypertensives.  They should hopefully improve when she is diuresed.  Continue to follow. 5. Bipolar disorder.  Continue home medications.  DVT prophylaxis: Lovenox Code Status: Full code Family Communication: Discussed with daughter at the bedside Disposition Plan: Discharge home once respiratory status has improved Consults called:   Admission status: Observation, telemetry  Kathie Dike MD [facility]   If 7PM-7AM, please contact night-coverage [email or web address]   12/05/2019, 8:13 PM

## 2019-12-05 NOTE — ED Notes (Signed)
Pt ambulating to bathroom multiple times. VSS. Denies pain.

## 2019-12-05 NOTE — Progress Notes (Signed)
Patient ambulated to bathroom and around room. SPO2 at rest 96% RA SPO2 while ambulating 91%  Elodia Florence RN

## 2019-12-05 NOTE — ED Notes (Signed)
Pt tolerated ambulation poorly, with oxygen sats dropping to 91% on RA and HR increasing to 120bpm. Pt had to stop and rest x3 while ambulating. Pt c/o SOB

## 2019-12-05 NOTE — Progress Notes (Signed)
*  PRELIMINARY RESULTS* Echocardiogram 2D Echocardiogram has been performed.  Leavy Cella 12/05/2019, 11:22 AM

## 2019-12-06 DIAGNOSIS — I10 Essential (primary) hypertension: Secondary | ICD-10-CM | POA: Diagnosis not present

## 2019-12-06 DIAGNOSIS — F3170 Bipolar disorder, currently in remission, most recent episode unspecified: Secondary | ICD-10-CM | POA: Diagnosis not present

## 2019-12-06 DIAGNOSIS — I5031 Acute diastolic (congestive) heart failure: Secondary | ICD-10-CM | POA: Diagnosis not present

## 2019-12-06 LAB — BASIC METABOLIC PANEL
Anion gap: 12 (ref 5–15)
BUN: 23 mg/dL (ref 8–23)
CO2: 27 mmol/L (ref 22–32)
Calcium: 9.1 mg/dL (ref 8.9–10.3)
Chloride: 99 mmol/L (ref 98–111)
Creatinine, Ser: 1.41 mg/dL — ABNORMAL HIGH (ref 0.44–1.00)
GFR calc Af Amer: 45 mL/min — ABNORMAL LOW (ref >60)
GFR calc non Af Amer: 38 mL/min — ABNORMAL LOW (ref >60)
Glucose, Bld: 125 mg/dL — ABNORMAL HIGH (ref 70–99)
Potassium: 4 mmol/L (ref 3.5–5.1)
Sodium: 138 mmol/L (ref 135–145)

## 2019-12-06 LAB — HIV ANTIBODY (ROUTINE TESTING W REFLEX): HIV Screen 4th Generation wRfx: NONREACTIVE

## 2019-12-06 LAB — GLUCOSE, CAPILLARY: Glucose-Capillary: 161 mg/dL — ABNORMAL HIGH (ref 70–99)

## 2019-12-06 MED ORDER — FUROSEMIDE 20 MG PO TABS
20.0000 mg | ORAL_TABLET | Freq: Every day | ORAL | 11 refills | Status: DC
Start: 2019-12-06 — End: 2020-01-02

## 2019-12-06 MED ORDER — SODIUM CHLORIDE 0.9 % IV BOLUS
500.0000 mL | Freq: Once | INTRAVENOUS | Status: AC
  Administered 2019-12-06: 500 mL via INTRAVENOUS

## 2019-12-06 NOTE — Discharge Summary (Signed)
Physician Discharge Summary  Irania [name] [id number] DOB: 1951/04/08 DOA: 12/04/2019  PCP: Kathyrn Drown, MD  Admit date: 12/04/2019 Discharge date: 12/06/2019  Admitted From: Home Disposition: Home  Recommendations for Outpatient Follow-up:  1. Follow up with PCP in 1-2 weeks 2. Please obtain BMP/CBC in one week 3. Follow-up with cardiology scheduled for next week 4. Consider outpatient referral for sleep study  Discharge Condition: Stable CODE STATUS: Full code Diet recommendation: Heart healthy  Brief/Interim Summary: 69 year old female with a history of sinus node dysfunction status post pacemaker, bipolar disorder, anxiety, developed persistent shortness of breath while she was exercising at the [facility].  Her symptoms not resolved when she stopped exercising.  They persisted through the day.  She had noticed intermittent wheezing over the past several weeks.  She presented to the emergency room where chest x-ray indicated developing interstitial edema.  BNP was noted to be normal.  Cardiac enzymes are negative and EKG did not show any acute changes.  She did not have any chest pain.  She was admitted to the hospital and started on intravenous Lasix and had good diuresis.  Overall shortness of breath has resolved.  Echocardiogram performed showed normal ejection fraction with grade 1 diastolic dysfunction.  She will be discharged on low-dose Lasix.  Case reviewed with Dr. Domenic Polite who agreed with outpatient follow-up with cardiology for consideration of ischemic work-up.  Patient feels significantly improved and feels ready for discharge home.  Discharge Diagnoses:  Active Problems:   Bipolar disorder ([facility])   Morbid obesity (HCC)   Dyspnea   Acute diastolic CHF (congestive heart failure) [facility])    Discharge Instructions  Discharge Instructions    Diet - low sodium heart healthy   Complete by: As directed    Increase activity slowly   Complete by: As directed       Allergies as of 12/06/2019      Reactions   Imipramine Hives, Rash   Tricor [fenofibrate] Other (See Comments)   Pain, "aching"      Medication List    TAKE these medications   ALPRAZolam 1 MG 24 hr tablet Commonly known as: XANAX XR Take 1 mg by mouth every morning.   ALPRAZolam 1 MG tablet Commonly known as: XANAX Take 0.5 mg by mouth daily as needed for anxiety.   calcium carbonate 500 MG chewable tablet Commonly known as: TUMS - dosed in mg elemental calcium Chew 1-3 tablets by mouth daily as needed for indigestion or heartburn.   furosemide 20 MG tablet Commonly known as: Lasix Take 1 tablet (20 mg total) by mouth daily. Start on 5/30   glucosamine-chondroitin 500-400 MG tablet Take 2 tablets by mouth every morning.   lamoTRIgine 200 MG tablet Commonly known as: LAMICTAL Take 400 mg by mouth at bedtime.   QUEtiapine 200 MG tablet Commonly known as: SEROQUEL Take 500 mg by mouth at bedtime.   Tylenol 8 Hour 650 MG CR tablet Generic drug: acetaminophen Take 650-1,300 mg by mouth See admin instructions. 1300MG  IN THE MORNING AND 650MG  AT BEDTIME   Vitamin D (Ergocalciferol) 1.25 MG (50000 UNIT) Caps capsule Commonly known as: DRISDOL Take 50,000 Units by mouth every Sunday.      Follow-up Information    [facility] Follow up on 12/12/2019.   Specialty: Cardiology Why: [facility] Follow-up on 12/12/2019 at 1:30 PM with Katina Dung, NP.  Contact information: [place] [phone]  Allergies  Allergen Reactions  . Imipramine Hives and Rash  . Tricor [Fenofibrate] Other (See Comments)    Pain, "aching"    Consultations:     Procedures/Studies: DG Chest Port 1 View  Result Date: 12/04/2019 CLINICAL DATA:  SHORTNESS OF BREATH AND GENERALIZED WEAKNESS EXAM: PORTABLE CHEST 1 VIEW COMPARISON:  Radiograph 02/02/2016, CT 06/16/2007 FINDINGS: Low volumes with some basilar atelectatic  change. There is increasing interstitial opacities throughout both lungs with a basilar and perihilar predominance. Some mild septal and fissural thickening is noted as well. The pulmonary vascularity is congested and indistinct. Cardiac silhouette is enlarged from prior portable radiograph. Pacer pack overlies left chest wall with leads in the right atrium and cardiac apex in similar position to comparison. No pneumothorax. No visible effusion at this time. No acute osseous or soft tissue abnormality. Degenerative changes are present in the imaged spine and shoulders. Dextrocurvature of the spine similar to prior. IMPRESSION: Cardiomegaly with increasing interstitial opacities throughout both lungs with a basilar and perihilar predominance. Findings likely represent CHF/volume overload with some developing interstitial pulmonary edema. Electronically Signed   By: Lovena Le M.D.   On: 12/04/2019 22:39   ECHOCARDIOGRAM COMPLETE  Result Date: 12/05/2019    ECHOCARDIOGRAM REPORT   Patient Name:   [name] Date of Exam: 12/05/2019 Medical Rec #:  [id number]             Height:       64.0 in Accession #:    [id number]            Weight:       285.0 lb Date of Birth:  [birth year]              BSA:          2.274 m Patient Age:    58 years              BP:           175/79 mmHg Patient Gender: F                     HR:           66 bpm. Exam Location:  [name] Procedure: 2D Echo Indications:    Dyspnea 786.09 / R06.00                 Abnormal ECG 794.31 / R94.31  History:        Patient has prior history of Echocardiogram examinations, most                 recent 10/23/2015. Signs/Symptoms:Dyspnea; Risk                 Factors:Hypertension and Non-Smoker. Bradycardia, Breast cancer,                 left breast , Bipolar disorder , Sinus node dysfunction.  Sonographer:    Leavy Cella RDCS (AE) Referring Phys: [id number] DAVID MANUEL [place]  1. Left ventricular ejection fraction, by estimation,  is 65 to 70%. The left ventricle has normal function. The left ventricle has no regional wall motion abnormalities. There is mild left ventricular hypertrophy. Left ventricular diastolic parameters are consistent with Grade I diastolic dysfunction (impaired relaxation).  2. Right ventricular systolic function is normal. The right ventricular size is normal. Tricuspid regurgitation signal is inadequate for assessing PA pressure.  3. The mitral valve is grossly normal. Trivial mitral valve regurgitation.  4. The aortic  valve is tricuspid. Aortic valve regurgitation is not visualized.  5. The inferior vena cava is normal in size with greater than 50% respiratory variability, suggesting right atrial pressure of 3 mmHg. FINDINGS  Left Ventricle: Left ventricular ejection fraction, by estimation, is 65 to 70%. The left ventricle has normal function. The left ventricle has no regional wall motion abnormalities. The left ventricular internal cavity size was normal in size. There is  mild left ventricular hypertrophy. Left ventricular diastolic parameters are consistent with Grade I diastolic dysfunction (impaired relaxation). Right Ventricle: The right ventricular size is normal. No increase in right ventricular wall thickness. Right ventricular systolic function is normal. Tricuspid regurgitation signal is inadequate for assessing PA pressure. Left Atrium: Left atrial size was normal in size. Right Atrium: Right atrial size was normal in size. Pericardium: There is no evidence of pericardial effusion. Presence of pericardial fat pad. Mitral Valve: The mitral valve is grossly normal. Trivial mitral valve regurgitation. Tricuspid Valve: The tricuspid valve is grossly normal. Tricuspid valve regurgitation is trivial. Aortic Valve: The aortic valve is tricuspid. Aortic valve regurgitation is not visualized. Mild aortic valve annular calcification. Pulmonic Valve: The pulmonic valve was not well visualized. Pulmonic valve  regurgitation is not visualized. Aorta: The aortic root is normal in size and structure. Venous: The inferior vena cava is normal in size with greater than 50% respiratory variability, suggesting right atrial pressure of 3 mmHg. IAS/Shunts: No atrial level shunt detected by color flow Doppler.  LEFT VENTRICLE PLAX 2D LVIDd:         3.72 cm  Diastology LVIDs:         1.89 cm  LV e' lateral:   7.51 cm/s LV PW:         1.32 cm  LV E/e' lateral: 8.4 LV IVS:        1.06 cm  LV e' medial:    6.31 cm/s LVOT diam:     2.00 cm  LV E/e' medial:  10.0 LVOT Area:     3.14 cm  RIGHT VENTRICLE RV S prime:     14.50 cm/s TAPSE (M-mode): 1.8 cm LEFT ATRIUM           Index       RIGHT ATRIUM           Index LA diam:      3.30 cm 1.45 cm/m  RA Area:     11.40 cm LA Vol (A2C): 33.1 ml 14.55 ml/m RA Volume:   28.90 ml  12.71 ml/m LA Vol (A4C): 37.8 ml 16.62 ml/m   AORTA Ao Root diam: 2.80 cm MITRAL VALVE MV Area (PHT): 2.87 cm    SHUNTS MV Decel Time: 264 msec    Systemic Diam: 2.00 cm MV E velocity: 63.10 cm/s MV A velocity: 93.10 cm/s MV E/A ratio:  0.68 Rozann Lesches MD Electronically signed by Rozann Lesches MD Signature Date/Time: 12/05/2019/12:38:14 PM    Final        Subjective: Patient is feeling better.  No shortness of breath or chest pain.  Discharge Exam: Vitals:   12/06/19 0814 12/06/19 1300 12/06/19 1443 12/06/19 1500  BP: 119/60  (!) 86/49 140/69  Pulse: 60 89 64 73  Resp:   18 18  Temp:   98.4 F (36.9 C)   TempSrc:      SpO2: 96% 96% 96%   Weight:      Height:        General: Pt is alert, awake, not in acute  distress Cardiovascular: RRR, S1/S2 +, no rubs, no gallops Respiratory: CTA bilaterally, no wheezing, no rhonchi Abdominal: Soft, NT, ND, bowel sounds + Extremities: no edema, no cyanosis    The results of significant diagnostics from this hospitalization (including imaging, microbiology, ancillary and laboratory) are listed below for reference.     Microbiology: Recent  Results (from the past 240 hour(s))  SARS Coronavirus 2 by RT PCR (hospital order, performed in [facility] hospital lab) Nasopharyngeal Nasopharyngeal Swab     Status: None   Collection Time: 12/05/19  1:29 AM   Specimen: Nasopharyngeal Swab  Result Value Ref Range Status   SARS Coronavirus 2 NEGATIVE NEGATIVE Final    Comment: (NOTE) SARS-CoV-2 target nucleic acids are NOT DETECTED. The SARS-CoV-2 RNA is generally detectable in upper and lower respiratory specimens during the acute phase of infection. The lowest concentration of SARS-CoV-2 viral copies this assay can detect is 250 copies / mL. A negative result does not preclude SARS-CoV-2 infection and should not be used as the sole basis for treatment or other patient management decisions.  A negative result may occur with improper specimen collection / handling, submission of specimen other than nasopharyngeal swab, presence of viral mutation(s) within the areas targeted by this assay, and inadequate number of viral copies (<250 copies / mL). A negative result must be combined with clinical observations, patient history, and epidemiological information. Fact Sheet for Patients:   StrictlyIdeas.no Fact Sheet for Healthcare Providers: BankingDealers.co.za This test is not yet approved or cleared  by the [place] FDA and has been authorized for detection and/or diagnosis of SARS-CoV-2 by FDA under an Emergency Use Authorization (EUA).  This EUA will remain in effect (meaning this test can be used) for the duration of the COVID-19 declaration under Section 564(b)(1) of the Act, 21 U.S.C. section 360bbb-3(b)(1), unless the authorization is terminated or revoked sooner. Performed at [facility], [address] [place], [place] [address]      Labs: BNP (last 3 results) Recent Labs    12/04/19 2329  BNP [id number]   Basic Metabolic Panel: Recent Labs  Lab 12/04/19 2329  12/06/19 0508  NA 138 138  K 4.0 4.0  CL 101 99  CO2 27 27  GLUCOSE 105* 125*  BUN 17 23  CREATININE 0.57 1.41*  CALCIUM 9.3 9.1   Liver Function Tests: Recent Labs  Lab 12/04/19 2329  AST 23  ALT 22  ALKPHOS 98  BILITOT 0.3  PROT 7.5  ALBUMIN 4.3   No results for input(s): LIPASE, AMYLASE in the last 168 hours. No results for input(s): AMMONIA in the last 168 hours. CBC: Recent Labs  Lab 12/04/19 2329  WBC 8.8  NEUTROABS 5.8  HGB 13.7  HCT 43.8  MCV 99.3  PLT 260   Cardiac Enzymes: No results for input(s): CKTOTAL, CKMB, CKMBINDEX, TROPONINI in the last 168 hours. BNP: Invalid input(s): POCBNP CBG: Recent Labs  Lab 12/06/19 0005  GLUCAP 161*   D-Dimer Recent Labs    12/05/19 1802  DDIMER 0.49   Hgb A1c No results for input(s): HGBA1C in the last 72 hours. Lipid Profile No results for input(s): CHOL, HDL, LDLCALC, TRIG, CHOLHDL, LDLDIRECT in the last 72 hours. Thyroid function studies No results for input(s): TSH, T4TOTAL, T3FREE, THYROIDAB in the last 72 hours.  Invalid input(s): FREET3 Anemia work up No results for input(s): VITAMINB12, FOLATE, FERRITIN, TIBC, IRON, RETICCTPCT in the last 72 hours. Urinalysis    Component Value Date/Time   COLORURINE YELLOW 10/23/2015  [place] 10/23/2015 0555   LABSPEC 1.010 10/23/2015 0555   PHURINE 5.5 10/23/2015 [place] 10/23/2015 0555   HGBUR NEGATIVE 10/23/2015 [place] 10/23/2015 0555   KETONESUR NEGATIVE 10/23/2015 0555   PROTEINUR NEGATIVE 10/23/2015 0555   UROBILINOGEN 0.2 02/05/2011 0916   NITRITE NEGATIVE 10/23/2015 0555   LEUKOCYTESUR NEGATIVE 10/23/2015 0555   Sepsis Labs Invalid input(s): PROCALCITONIN,  WBC,  LACTICIDVEN Microbiology Recent Results (from the past 240 hour(s))  SARS Coronavirus 2 by RT PCR (hospital order, performed in [place] hospital lab) Nasopharyngeal Nasopharyngeal Swab     Status: None   Collection Time:  12/05/19  1:29 AM   Specimen: Nasopharyngeal Swab  Result Value Ref Range Status   SARS Coronavirus 2 NEGATIVE NEGATIVE Final    Comment: (NOTE) SARS-CoV-2 target nucleic acids are NOT DETECTED. The SARS-CoV-2 RNA is generally detectable in upper and lower respiratory specimens during the acute phase of infection. The lowest concentration of SARS-CoV-2 viral copies this assay can detect is 250 copies / mL. A negative result does not preclude SARS-CoV-2 infection and should not be used as the sole basis for treatment or other patient management decisions.  A negative result may occur with improper specimen collection / handling, submission of specimen other than nasopharyngeal swab, presence of viral mutation(s) within the areas targeted by this assay, and inadequate number of viral copies (<250 copies / mL). A negative result must be combined with clinical observations, patient history, and epidemiological information. Fact Sheet for Patients:   StrictlyIdeas.no Fact Sheet for Healthcare Providers: BankingDealers.co.za This test is not yet approved or cleared  by the [place] FDA and has been authorized for detection and/or diagnosis of SARS-CoV-2 by FDA under an Emergency Use Authorization (EUA).  This EUA will remain in effect (meaning this test can be used) for the duration of the COVID-19 declaration under Section 564(b)(1) of the Act, 21 U.S.C. section 360bbb-3(b)(1), unless the authorization is terminated or revoked sooner. Performed at [facility], [address] [place], [place] [address]      Time coordinating discharge: 110mins  SIGNED:   Kathie Dike, MD  [facility] 12/06/2019, 9:07 PM   If 7PM-7AM, please contact night-coverage [email or web address]

## 2019-12-06 NOTE — Progress Notes (Signed)
Ambulated patient down hallway. SPO2 94-96%. HR 89-92 No dizziness expressed. MD notified. Elodia Florence RN

## 2019-12-06 NOTE — Progress Notes (Addendum)
   12/06/19 0008  Provider Notification  Provider Name/Title Dr Darrick Meigs  Date Provider Notified 12/06/19  Time Provider Notified 0008  Notification Type Page  Notification Reason Change in status ( BP 72/50, symptomatic, feels very sick per pt)  Response See new orders (500 NSS bolus,  )  Date of Provider Response 12/06/19  Time of Provider Response 0008   UPON entering the patient's room, PT. Looks diaphoretic and hard to arouse.  VS and BG  checked stat. Placed a call to Dr. Darrick Meigs and ordered to give a bolus of 500 ccNSS.  After Bolus BP went up to 90/57, more awake.   0203- BP Rechecked- 102/59, updated DR. Lama.

## 2019-12-07 ENCOUNTER — Telehealth: Payer: Self-pay | Admitting: Family Medicine

## 2019-12-07 NOTE — Telephone Encounter (Signed)
Glad to hear she is doing better if we can be of any help let us know

## 2019-12-07 NOTE — Telephone Encounter (Signed)
FYI- patient wanted doctor to know she was in the hospital with congested heart failure from 5/25-5/27. She is at home now. She cancelled her appointment for 6/1

## 2019-12-07 NOTE — Telephone Encounter (Signed)
Discussed with pt

## 2019-12-11 ENCOUNTER — Ambulatory Visit: Payer: PPO | Admitting: Family Medicine

## 2019-12-11 NOTE — Progress Notes (Signed)
Cardiology Office Note  Date: 12/12/2019   ID: [name], [place] [birth year], MRN [id number]  PCP:  Kathyrn Drown, MD  Cardiologist:  No primary care provider on file. Electrophysiologist:  Cristopher Peru, MD   Chief Complaint: Follow-up dyspnea, diastolic heart failure  History of Present Illness: [name] is a 69 y.o. female with a history of sinus node dysfunction with status post pacemaker placement, bipolar disorder, anxiety.  Recent diastolic heart failure/dyspnea.  Recent presentation on 12/04/2019 with persistent shortness of breath while exercising at the [facility].  Her symptoms did not resolve when she stopped exercising.  They remained persistent throughout the day.  She noticed intermittent wheezing over the past several weeks.  Chest x-ray revealed developing interstitial edema.  BNP was normal.  Cardiac enzymes were negative and EKG did not show any acute changes.  Admitted to the hospital started on IV Lasix and had good diuresis.  Echocardiogram showed normal EF with grade 1 diastolic dysfunction.  She was discharged on low-dose Lasix.  Dr. Domenic Polite agreed with outpatient follow-up with cardiology for consideration of ischemic work-up.  She was discharged home.   Last saw Dr. Lovena Le 05/03/2019 for ongoing follow-up of sinus node dysfunction status post Biotronik PPM insertion 2 years prior.  Patient states she has been doing fairly well from a cardiac perspective since admission for diastolic heart failure.l.  She states she has been walking around her property since discharge with some mild shortness of breath.  She had a previous stress test back in 2015 which was deemed to be abnormal with perfusion imaging showing evidence of potential anterior wall ischemia in the LAD distribution.  Dr. Domenic Polite thought it might be prudent for an ischemic work-up given recent admission.  Patient also has symptoms of sleep apnea such as snoring and daytime hypersomnolence.  She  denies any PND or orthopnea, significant weight gain, or lower extremity edema since discharge.  Blood pressure is elevated today at 146/90.  Patient states her blood pressures at home are usually in the 130s over 80s.  Past Medical History:  Diagnosis Date  . Anxiety   . Bipolar 1 disorder ([place])   . 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 Breast cancer, left breast ([place]) 07/22/2011  . Cancer ([place])   . Depression   . High cholesterol   . Hypertension   . IBS (irritable bowel syndrome)   . Impaired fasting glucose   . Memory loss   . OA (osteoarthritis)   . Obesity   . Personal history of chemotherapy 2009  . Personal history of radiation therapy 2009  . Polycystic ovarian disease   . 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 Sleep apnea     Past Surgical History:  Procedure Laterality Date  . ABDOMINAL HYSTERECTOMY    . APPENDECTOMY    . BREAST BIOPSY Left 2011  . BREAST BIOPSY  2008  . BREAST LUMPECTOMY Left 2008  . CHOLECYSTECTOMY    . COLONOSCOPY N/A 09/13/2013   Procedure: COLONOSCOPY;  Surgeon: [name], MD;  [facility];  Service: Endoscopy;  Laterality: N/A;  730  . EP IMPLANTABLE DEVICE N/A 01/06/2016   Procedure: Pacemaker Implant;  Surgeon: [name], MD;  Location: [facility];  Service: Cardiovascular;  Laterality: N/A;  . ESOPHAGOGASTRODUODENOSCOPY N/A 01/17/2014   Procedure: ESOPHAGOGASTRODUODENOSCOPY (EGD);  Surgeon: [name], MD;  [facility];  Service: Endoscopy;  Laterality:  N/A;  245  . TOTAL KNEE ARTHROPLASTY Bilateral right knee   2012, 2007    Current Outpatient Medications  Medication Sig Dispense Refill  . acetaminophen (TYLENOL 8 HOUR) 650 MG CR tablet Take 650-1,300 mg by mouth See admin instructions. 1300MG  IN THE MORNING AND 650MG  AT BEDTIME    . ALPRAZolam (XANAX XR) 1 MG 24 hr tablet Take 1 mg by mouth every morning.     [name] ALPRAZolam (XANAX) 1 MG  tablet Take 0.5 mg by mouth daily as needed for anxiety.     . calcium carbonate (TUMS - DOSED IN MG ELEMENTAL CALCIUM) 500 MG chewable tablet Chew 1-3 tablets by mouth daily as needed for indigestion or heartburn.    . furosemide (LASIX) 20 MG tablet Take 1 tablet (20 mg total) by mouth daily. Start on 5/30 30 tablet 11  . glucosamine-chondroitin 500-400 MG tablet Take 2 tablets by mouth every morning.     . lamoTRIgine (LAMICTAL) 200 MG tablet Take 400 mg by mouth at bedtime.     [name] QUEtiapine (SEROQUEL) 200 MG tablet Take 500 mg by mouth at bedtime.     . Vitamin D, Ergocalciferol, (DRISDOL) 50000 units CAPS capsule Take 50,000 Units by mouth every Sunday.      No current facility-administered medications for this visit.   Allergies:  Imipramine and Tricor [fenofibrate]   Social History: The patient  reports that she has never smoked. She has never used smokeless tobacco. She reports that she does not drink alcohol or use drugs.   Family History: The patient's family history includes Alcohol abuse in her father; Bipolar disorder in her mother and sister; Diabetes in her mother and sister; Heart disease in an other family member; Hypertension in her father.   ROS:  Please see the history of present illness. Otherwise, complete review of systems is positive for none.  All other systems are reviewed and negative.   Physical Exam: VS:  BP (!) 146/90   Pulse 68   Ht 5\' 4"  (1.626 m)   Wt 283 lb (128.4 kg)   SpO2 97%   BMI 48.58 kg/m , BMI Body mass index is 48.58 kg/m.  Wt Readings from Last 3 Encounters:  12/12/19 283 lb (128.4 kg)  12/06/19 258 lb (117 kg)  05/03/19 276 lb (125.2 kg)    General: Obese patient appears comfortable at rest. Neck: Supple, no elevated JVP or carotid bruits, no thyromegaly. Lungs: Clear to auscultation, nonlabored breathing at rest. Cardiac: Regular rate and rhythm, no S3 or significant systolic murmur, no pericardial rub. Extremities: No pitting edema,  distal pulses 2+. Skin: Warm and dry. Musculoskeletal: No kyphosis. Neuropsychiatric: Alert and oriented x3, affect grossly appropriate.  ECG:  EKG Dec 04, 2019 showed atrial paced rhythm rate of 65, minimal voltage criteria for LVH, may be normal variant, cannot rule out anterior infarct, age undetermined.  No significant change since last tracing  Recent Labwork: 12/04/2019: ALT 22; AST 23; B Natriuretic Peptide 46.0; Hemoglobin 13.7; Platelets 260 12/06/2019: BUN 23; Creatinine, Ser 1.41; Potassium 4.0; Sodium 138     Component Value Date/Time   CHOL 158 12/21/2018 0810   TRIG 249 (H) 12/21/2018 0810   HDL 38 (L) 12/21/2018 0810   CHOLHDL 4.2 12/21/2018 0810   CHOLHDL 3.3 08/22/2014 0828   VLDL 27 08/22/2014 0828   LDLCALC 70 12/21/2018 0810    Other Studies Reviewed Today:  Echocardiogram 12/05/2019 1. Left ventricular ejection fraction, by estimation, is 65 to 70%. The left  ventricle has normal function. The left ventricle has no regional wall motion abnormalities. There is mild left ventricular hypertrophy. Left ventricular diastolic parameters are consistent with Grade I diastolic dysfunction (impaired relaxation). 2. Right ventricular systolic function is normal. The right ventricular size is normal. Tricuspid regurgitation signal is inadequate for assessing PA pressure. 3. The mitral valve is grossly normal. Trivial mitral valve regurgitation. 4. The aortic valve is tricuspid. Aortic valve regurgitation is not visualized. 5. The inferior vena cava is normal in size with greater than 50% respiratory variability, suggesting right atrial pressure of 3 mmHg.   Nuclear stress test 12/21/2013 IMPRESSION: Low to intermediate risk abnormal Lexiscan Cardiolite. There were no diagnostic ST segment abnormalities. Perfusion imaging shows evidence of potential anterior wall ischemia in the LAD distribution as outlined above, although breast attenuation is also evident and may be  contributing to this partially reversible zone. LVEF is calculated at 86% with normal volumes and wall motion.  Assessment and Plan: 67 1. Shortness of breath   2. Acute diastolic CHF (congestive heart failure) ([place])   3. Morbid obesity ([place])   4. Cardiac pacemaker in situ   5. Snoring   6. Hypersomnia    1. Shortness of breath Recent hospital admission with shortness of breath.  Received IV diuresis and started on outpatient diuretics.  2. Acute diastolic CHF (congestive heart failure) ([place]) Recent hospital admission for diastolic heart failure receiving IV Lasix. Echo EF 65-70%, mild LVH , Grade I DD.  Discharged on low-dose Lasix 20 mg daily.  Continue Lasix 20 mg daily.  3. Morbid obesity ([facility]) BMI 48.8.  Patient is working on weight loss.  Her weight has decreased since last year from  4. Cardiac pacemaker in situ Sinus node dysfunction.  Biotronik DDD pacemaker working normally at last pacemaker check.  Sees Dr. Lovena Le.  5.  Snoring and hypersomnia Patient complaining of significant snoring and hypersomnia.  Please refer to pulmonology for evaluation and possible sleep study  Medication Adjustments/Labs and Tests Ordered: Current medicines are reviewed at length with the patient today.  Concerns regarding medicines are outlined above.   Disposition: Follow-up with Dr. Lovena Le or APP 3 months  Signed, Levell July, NP 12/12/2019 1:58 PM    [facility] at [address], [place], [place] [address] Phone: [phone]; Fax: [phone]

## 2019-12-12 ENCOUNTER — Other Ambulatory Visit: Payer: Self-pay

## 2019-12-12 ENCOUNTER — Encounter: Payer: Self-pay | Admitting: Family Medicine

## 2019-12-12 ENCOUNTER — Ambulatory Visit: Payer: PPO | Admitting: Family Medicine

## 2019-12-12 VITALS — BP 146/90 | HR 68 | Ht 64.0 in | Wt 283.0 lb

## 2019-12-12 DIAGNOSIS — R0683 Snoring: Secondary | ICD-10-CM | POA: Diagnosis not present

## 2019-12-12 DIAGNOSIS — R0602 Shortness of breath: Secondary | ICD-10-CM

## 2019-12-12 DIAGNOSIS — Z950 Presence of cardiac pacemaker: Secondary | ICD-10-CM | POA: Diagnosis not present

## 2019-12-12 DIAGNOSIS — I5031 Acute diastolic (congestive) heart failure: Secondary | ICD-10-CM | POA: Diagnosis not present

## 2019-12-12 DIAGNOSIS — G4710 Hypersomnia, unspecified: Secondary | ICD-10-CM

## 2019-12-12 NOTE — Patient Instructions (Addendum)
Medication Instructions:  Your physician recommends that you continue on your current medications as directed. Please refer to the Current Medication list given to you today.  *If you need a refill on your cardiac medications before your next appointment, please call your pharmacy*   Lab Work: None today If you have labs (blood work) drawn today and your tests are completely normal, you will receive your results only by: [name] MyChart Message (if you have MyChart) OR . A paper copy in the mail If you have any lab test that is abnormal or we need to change your treatment, we will call you to review the results.   Testing/Procedures: Your physician has requested that you have a lexiscan myoview. For further information please visit [email or web address]. Please follow instruction sheet, as given.     Follow-Up: At [facility], you and your health needs are our priority.  As part of our continuing mission to provide you with exceptional heart care, we have created designated Provider Care Teams.  These Care Teams include your primary Cardiologist (physician) and Advanced Practice Providers (APPs -  Physician Assistants and Nurse Practitioners) who all work together to provide you with the care you need, when you need it.  We recommend signing up for the patient portal called "MyChart".  Sign up information is provided on this After Visit Summary.  MyChart is used to connect with patients for Virtual Visits (Telemedicine).  Patients are able to view lab/test results, encounter notes, upcoming appointments, etc.  Non-urgent messages can be sent to your provider as well.   To learn more about what you can do with MyChart, go to [email or web address].    Your next appointment:   3 month(s)  The format for your next appointment:   In Person  Provider:   Cristopher Peru, MD   Other Instructions We have referred you to [facility].They will call you for an  apt.        Thank you for choosing [facility] !

## 2019-12-14 ENCOUNTER — Other Ambulatory Visit: Payer: Self-pay

## 2019-12-14 ENCOUNTER — Encounter (HOSPITAL_COMMUNITY): Payer: Self-pay

## 2019-12-14 ENCOUNTER — Encounter (HOSPITAL_BASED_OUTPATIENT_CLINIC_OR_DEPARTMENT_OTHER)
Admission: RE | Admit: 2019-12-14 | Discharge: 2019-12-14 | Disposition: A | Discharge status: Home / Self Care | Payer: PPO | Source: Ambulatory Visit | Attending: Family Medicine | Admitting: Family Medicine

## 2019-12-14 ENCOUNTER — Encounter (HOSPITAL_COMMUNITY)
Admission: RE | Admit: 2019-12-14 | Discharge: 2019-12-14 | Disposition: A | Discharge status: Home / Self Care | Payer: PPO | Source: Ambulatory Visit | Attending: Family Medicine | Admitting: Family Medicine

## 2019-12-14 DIAGNOSIS — I5031 Acute diastolic (congestive) heart failure: Secondary | ICD-10-CM

## 2019-12-14 HISTORY — DX: Heart failure, unspecified: I50.9

## 2019-12-14 MED ORDER — SODIUM CHLORIDE FLUSH 0.9 % IV SOLN
INTRAVENOUS | Status: AC
  Administered 2019-12-14: 10 mL via INTRAVENOUS
  Filled 2019-12-14: qty 10

## 2019-12-14 MED ORDER — REGADENOSON 0.4 MG/5ML IV SOLN
INTRAVENOUS | Status: AC
  Administered 2019-12-14: 0.4 mg via INTRAVENOUS
  Filled 2019-12-14: qty 5

## 2019-12-14 MED ORDER — TECHNETIUM TC 99M TETROFOSMIN IV KIT
10.0000 | PACK | Freq: Once | INTRAVENOUS | Status: AC | PRN
  Administered 2019-12-14: 10.3 via INTRAVENOUS

## 2019-12-14 MED ORDER — TECHNETIUM TC 99M TETROFOSMIN IV KIT
30.0000 | PACK | Freq: Once | INTRAVENOUS | Status: AC | PRN
  Administered 2019-12-14: 32.8 via INTRAVENOUS

## 2019-12-17 LAB — NM MYOCAR MULTI W/SPECT W/WALL MOTION / EF
LV dias vol: 67 mL (ref 46–106)
LV sys vol: 17 mL
Peak HR: 79 {beats}/min
RATE: 0.64
Rest HR: 65 {beats}/min
SDS: 3
SRS: 0
SSS: 3
TID: 0.95

## 2019-12-18 ENCOUNTER — Telehealth: Payer: Self-pay | Admitting: *Deleted

## 2019-12-18 ENCOUNTER — Other Ambulatory Visit: Payer: Self-pay | Admitting: Family Medicine

## 2019-12-18 DIAGNOSIS — Z1231 Encounter for screening mammogram for malignant neoplasm of breast: Secondary | ICD-10-CM

## 2019-12-18 NOTE — Telephone Encounter (Signed)
-----   Message from Verta Ellen., NP sent at 12/17/2019  5:47 PM EDT ----- Please call the patient and tell her the stress test did not show any major areas of lack of blood flow or obstruction/ narrowing. It is considered a low risk study. Thank you

## 2019-12-19 NOTE — Telephone Encounter (Signed)
Pt voiced understanding

## 2019-12-27 ENCOUNTER — Other Ambulatory Visit: Payer: Self-pay

## 2019-12-27 ENCOUNTER — Ambulatory Visit
Admission: EM | Admit: 2019-12-27 | Discharge: 2019-12-27 | Disposition: A | Discharge status: Home / Self Care | Payer: PPO | Attending: Family Medicine | Admitting: Family Medicine

## 2019-12-27 ENCOUNTER — Telehealth: Payer: Self-pay

## 2019-12-27 ENCOUNTER — Ambulatory Visit (INDEPENDENT_AMBULATORY_CARE_PROVIDER_SITE_OTHER): Payer: PPO

## 2019-12-27 DIAGNOSIS — R5383 Other fatigue: Secondary | ICD-10-CM | POA: Diagnosis not present

## 2019-12-27 DIAGNOSIS — R531 Weakness: Secondary | ICD-10-CM | POA: Diagnosis not present

## 2019-12-27 DIAGNOSIS — R0602 Shortness of breath: Secondary | ICD-10-CM

## 2019-12-27 DIAGNOSIS — J939 Pneumothorax, unspecified: Secondary | ICD-10-CM | POA: Diagnosis not present

## 2019-12-27 MED ORDER — AMLODIPINE BESYLATE 5 MG PO TABS
5.0000 mg | ORAL_TABLET | Freq: Every day | ORAL | 3 refills | Status: DC
Start: 2019-12-27 — End: 2020-01-02

## 2019-12-27 MED ORDER — NITROGLYCERIN 0.4 MG SL SUBL
0.4000 mg | SUBLINGUAL_TABLET | SUBLINGUAL | 3 refills | Status: DC | PRN
Start: 2019-12-27 — End: 2020-01-02

## 2019-12-27 NOTE — Telephone Encounter (Signed)
[name] will start taking amlodipine 5 mg daily and I gave her instructions for NTG use (she is a retired Therapist, sports and understands use)   She will keep bp log and call us back in 5 days with readings.

## 2019-12-27 NOTE — ED Triage Notes (Signed)
Pt presents with c/o sob and elevated BP/ pt states she thinks she has had some wheezing

## 2019-12-27 NOTE — Telephone Encounter (Signed)
Call the patient and start tell her we are going to schedule her for a Lexiscan stress test and start her on NTG SL PRN . Give her the instructions on how to take it. So call her and schedule her for the stress test tell her we are going to send the NTG in for her to pick up. Thanks.

## 2019-12-27 NOTE — Telephone Encounter (Signed)
Patient informed and verbalized understanding of plan. 

## 2019-12-27 NOTE — Telephone Encounter (Signed)
Attempt to reach. Left message to call back.-cc

## 2019-12-27 NOTE — Discharge Instructions (Signed)
Your chest xray today has improved from your last one  I think that you may continue to monitor your symptoms, if they are worsening, follow up with cardiology or the ER

## 2019-12-27 NOTE — Telephone Encounter (Signed)
Please call the patient and give her the NTG SL and start the amlodipine 5 mg as discussed. Tell her the stress test does not always tell the whole story. Tell her the only definitive way to find out for sure if she has coronary artery disease is to proceed with a cardiac catheterization. She does have multiple risk factors for coronary artery disease and sometimes females do not have the classic anginal symptoms. Significant fatigue may be the only symptom. Since it is getting worse she likely needs to come see one of the Doc's so they can determine the next steps. I have a feeling she probably needs a catheterization. Thanks

## 2019-12-27 NOTE — Telephone Encounter (Signed)
[name] can no longer to things she normally could before w/o having extreme fatigue and SOB.For example, she used to walk parking lots at stores and now becomes SOB easily. Her BP has been elevated at home, 177/102, 145/97, 181/113.   She went to an urgent Care today and had cxr she was told was normal. Her quality of life is greatly diminished.

## 2019-12-27 NOTE — Telephone Encounter (Signed)
Please call per Pt [id number] BP and SOB   [phone]   Thanks renee

## 2019-12-27 NOTE — Telephone Encounter (Signed)
Also start her on Amlodipine 5 mg daily due to her increased BP. Thanks

## 2019-12-27 NOTE — ED Provider Notes (Signed)
[facility]    CSN: [id number] Arrival date & time: 12/27/19  1309      History   Chief Complaint Chief Complaint  Patient presents with  . Shortness of Breath    HPI [name] is a 69 y.o. female.   Patient reports that she has CHF and has been feeling short of breath today. Per chart review, she has extensive medical history including having a pacemaker placed. Reports that her blood pressure has been increasing today as well. Reports that she was hospitalized and diuresed at the end of last month and that she is feeling similarly. Reports that she has taken her lasix today as well as xanax when she noticed that her BP was getting higher. Reports that she feels like she is wheezing some today as well. She states that she did notice that she was feeling a little bit better after she took her medication this morning.  ROS per HPI  The history is provided by the patient.  Shortness of Breath   Past Medical History:  Diagnosis Date  . Anxiety   . Bipolar 1 disorder ([place])   . 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 Breast cancer, left breast ([place]) 07/22/2011  . Cancer ([place])   . CHF (congestive heart failure) ([place])   . Depression   . High cholesterol   . Hypertension   . IBS (irritable bowel syndrome)   . Impaired fasting glucose   . Memory loss   . OA (osteoarthritis)   . Obesity   . Personal history of chemotherapy 2009  . Personal history of radiation therapy 2009  . Polycystic ovarian disease   . 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 Sleep apnea     Patient Active Problem List   Diagnosis Date Noted  . Dyspnea 12/05/2019  . Acute diastolic CHF (congestive heart failure) ([place]) 12/05/2019  . Memory loss 12/12/2017  . Morbid obesity ([place]) 09/05/2017  . Sinus node dysfunction ([place]) 01/06/2016  . Bradycardia 10/22/2015  . Bipolar disorder ([place]) 10/22/2015  . Anxiety 10/22/2015  .  Pre-syncope 10/22/2015  . Episodic lightheadedness   . GERD (gastroesophageal reflux disease) 01/08/2014  . Hypertension 11/28/2013  . Palpitations 11/28/2013  . Osteoarthritis 11/08/2013  . Lumbar back pain 11/08/2013  . Hypertriglyceridemia 08/06/2013  . Depression 04/20/2013  . Breast cancer, left breast ([place]) 07/22/2011    Past Surgical History:  Procedure Laterality Date  . ABDOMINAL HYSTERECTOMY    . APPENDECTOMY    . BREAST BIOPSY Left 2011  . BREAST BIOPSY  2008  . BREAST LUMPECTOMY Left 2008  . CHOLECYSTECTOMY    . COLONOSCOPY N/A 09/13/2013   Procedure: COLONOSCOPY;  Surgeon: [name], MD;  [facility];  Service: Endoscopy;  Laterality: N/A;  730  . EP IMPLANTABLE DEVICE N/A 01/06/2016   Procedure: Pacemaker Implant;  Surgeon: [name], MD;  Location: [facility];  Service: Cardiovascular;  Laterality: N/A;  . ESOPHAGOGASTRODUODENOSCOPY N/A 01/17/2014   Procedure: ESOPHAGOGASTRODUODENOSCOPY (EGD);  Surgeon: [name], MD;  [facility];  Service: Endoscopy;  Laterality: N/A;  245  . TOTAL KNEE ARTHROPLASTY Bilateral right knee   2012, 2007    OB History   No obstetric history on file.      Home Medications    Prior to Admission medications   Medication Sig Start Date End Date Taking? Authorizing Provider  acetaminophen (TYLENOL 8 HOUR) 650 MG  CR tablet Take 650-1,300 mg by mouth See admin instructions. 1300MG  IN THE MORNING AND 650MG  AT BEDTIME    [provider]  ALPRAZolam (XANAX XR) 1 MG 24 hr tablet Take 1 mg by mouth every morning.     [provider]  ALPRAZolam [name]) 1 MG tablet Take 0.5 mg by mouth daily as needed for anxiety.     [provider]  amLODipine (NORVASC) 5 MG tablet Take 1 tablet (5 mg total) by mouth daily. 12/27/19 03/26/20  Verta Ellen., NP  calcium carbonate (TUMS - DOSED IN MG ELEMENTAL CALCIUM) 500 MG chewable tablet Chew 1-3 tablets by mouth daily as needed  for indigestion or heartburn.    [provider]  furosemide (LASIX) 20 MG tablet Take 1 tablet (20 mg total) by mouth daily. Start on 5/30 12/06/19 12/05/20  Kathie Dike, MD  glucosamine-chondroitin 500-400 MG tablet Take 2 tablets by mouth every morning.     [provider]  lamoTRIgine (LAMICTAL) 200 MG tablet Take 400 mg by mouth at bedtime.     [provider]  nitroGLYCERIN (NITROSTAT) 0.4 MG SL tablet Place 1 tablet (0.4 mg total) under the tongue every 5 (five) minutes as needed. 12/27/19   Verta Ellen., NP  QUEtiapine (SEROQUEL) 200 MG tablet Take 500 mg by mouth at bedtime.  05/07/13   [name], MD  Vitamin D, Ergocalciferol, (DRISDOL) 50000 units CAPS capsule Take 50,000 Units by mouth every Sunday.     [provider]    Family History Family History  Problem Relation Age of Onset  . Bipolar disorder Mother   . Diabetes Mother   . Bipolar disorder Sister   . Diabetes Sister   . Alcohol abuse Father   . Hypertension Father   . Heart disease Other     Social History Social History   Tobacco Use  . Smoking status: Never Smoker  . Smokeless tobacco: Never Used  Vaping Use  . Vaping Use: Never used  Substance Use Topics  . Alcohol use: No    Alcohol/week: 0.0 standard drinks  . Drug use: No     Allergies   Imipramine and Tricor [fenofibrate]   Review of Systems Review of Systems  Respiratory: Positive for shortness of breath.      Physical Exam Triage Vital Signs ED Triage Vitals  Enc Vitals Group     BP 12/27/19 1319 (!) 158/93     Pulse Rate 12/27/19 1319 72     Resp 12/27/19 1319 18     Temp 12/27/19 1319 97.7 F (36.5 C)     Temp src --      SpO2 12/27/19 1319 95 %     Weight --      Height --      Head Circumference --      Peak Flow --      Pain Score 12/27/19 1317 0     Pain Loc --      Pain Edu? --      Excl. in [place]? --    No data found.  Updated Vital Signs BP (!) 158/93    Pulse 72   Temp 97.7 F (36.5 C)   Resp 18   SpO2 95%   Visual Acuity Right Eye Distance:   Left Eye Distance:   Bilateral Distance:    Right Eye Near:   Left Eye Near:    Bilateral Near:     Physical Exam Vitals  and nursing note reviewed.  Constitutional:      General: She is not in acute distress.    Appearance: She is well-developed. She is obese. She is ill-appearing.  HENT:     Head: Normocephalic and atraumatic.  Eyes:     Conjunctiva/sclera: Conjunctivae normal.  Cardiovascular:     Rate and Rhythm: Normal rate and regular rhythm.     Heart sounds: No murmur heard.   Pulmonary:     Effort: Pulmonary effort is normal. No respiratory distress.     Breath sounds: Examination of the right-middle field reveals decreased breath sounds. Examination of the left-middle field reveals decreased breath sounds. Examination of the right-lower field reveals decreased breath sounds. Examination of the left-lower field reveals decreased breath sounds. Decreased breath sounds present. No wheezing, rhonchi or rales.  Chest:     Chest wall: No mass.  Abdominal:     Palpations: Abdomen is soft.     Tenderness: There is no abdominal tenderness.  Musculoskeletal:        General: Normal range of motion.     Cervical back: Normal range of motion and neck supple.     Right lower leg: Edema present.     Left lower leg: Edema present.  Skin:    General: Skin is warm and dry.     Capillary Refill: Capillary refill takes less than 2 seconds.  Neurological:     General: No focal deficit present.     Mental Status: She is alert and oriented to person, place, and time.  Psychiatric:        Mood and Affect: Mood normal.        Behavior: Behavior normal.      UC Treatments / Results  Labs (all labs ordered are listed, but only abnormal results are displayed) Labs Reviewed - No data to display  EKG   Radiology DG Chest 2 View  Result Date: 12/27/2019 CLINICAL DATA:  69 year old  female with shortness of breath. EXAM: CHEST - 2 VIEW COMPARISON:  Portable chest 12/04/2019 and earlier. FINDINGS: PA and lateral views. Cardiac and mediastinal contours remain within normal limits. Stable left chest dual lead cardiac pacemaker. Visualized tracheal air column is within normal limits. No pneumothorax, pulmonary edema, pleural effusion or confluent pulmonary opacity. Mild eventration of the right hemidiaphragm is stable (normal variant). No acute osseous abnormality identified. Negative visible bowel gas pattern. Right upper quadrant cholecystectomy clips. IMPRESSION: No acute cardiopulmonary abnormality. Electronically Signed   By: Genevie Ann M.D.   On: 12/27/2019 13:50    Procedures Procedures (including critical care time)  Medications Ordered in UC Medications - No data to display  Initial Impression / Assessment and Plan / UC Course  I have reviewed the triage vital signs and the nursing notes.  Pertinent labs & imaging results that were available during my care of the patient were reviewed by me and considered in my medical decision making (see chart for details).     SOB Fatigue Generalized Weakness  Presents today with SOB at rest, increased fatigue and generalized weakness Known hx of CHF, last exacerbation she was hospitalized and diuresed She is concerned about this again today Decreased breath sounds Chest xray negative for fluid overload in office today Discussed with patient to continue to monitor her symptoms and get in touch with cardiology if symptoms persist Discussed with patient to follow up with the ER for any worsening SOB, higher BP, edema, or other concerning symptoms Verbalizes understanding and is in  agreement with treatment plan.  Final Clinical Impressions(s) / UC Diagnoses   Final diagnoses:  SOB (shortness of breath)  Other fatigue  Generalized weakness     Discharge Instructions     Your chest xray today has improved from your last  one  I think that you may continue to monitor your symptoms, if they are worsening, follow up with cardiology or the ER    ED Prescriptions    None     PDMP not reviewed this encounter.   Faustino Congress, NP 12/27/19 1829

## 2019-12-31 ENCOUNTER — Encounter: Payer: Self-pay | Admitting: Neurology

## 2019-12-31 ENCOUNTER — Encounter (HOSPITAL_COMMUNITY): Payer: Self-pay | Admitting: *Deleted

## 2019-12-31 ENCOUNTER — Other Ambulatory Visit: Payer: Self-pay

## 2019-12-31 ENCOUNTER — Inpatient Hospital Stay (HOSPITAL_COMMUNITY)
Admission: EM | Admit: 2019-12-31 | Discharge: 2020-01-02 | DRG: 287 | Disposition: A | Discharge status: Home / Self Care | Payer: PPO | Attending: Internal Medicine | Admitting: Internal Medicine

## 2019-12-31 ENCOUNTER — Emergency Department (HOSPITAL_COMMUNITY): Payer: PPO

## 2019-12-31 DIAGNOSIS — E7800 Pure hypercholesterolemia, unspecified: Secondary | ICD-10-CM | POA: Diagnosis not present

## 2019-12-31 DIAGNOSIS — R0602 Shortness of breath: Secondary | ICD-10-CM | POA: Diagnosis not present

## 2019-12-31 DIAGNOSIS — I5033 Acute on chronic diastolic (congestive) heart failure: Secondary | ICD-10-CM | POA: Diagnosis not present

## 2019-12-31 DIAGNOSIS — I5032 Chronic diastolic (congestive) heart failure: Secondary | ICD-10-CM | POA: Diagnosis present

## 2019-12-31 DIAGNOSIS — K589 Irritable bowel syndrome without diarrhea: Secondary | ICD-10-CM | POA: Diagnosis present

## 2019-12-31 DIAGNOSIS — R072 Precordial pain: Secondary | ICD-10-CM | POA: Diagnosis not present

## 2019-12-31 DIAGNOSIS — Z950 Presence of cardiac pacemaker: Secondary | ICD-10-CM

## 2019-12-31 DIAGNOSIS — E785 Hyperlipidemia, unspecified: Secondary | ICD-10-CM | POA: Diagnosis present

## 2019-12-31 DIAGNOSIS — Z811 Family history of alcohol abuse and dependence: Secondary | ICD-10-CM

## 2019-12-31 DIAGNOSIS — I495 Sick sinus syndrome: Secondary | ICD-10-CM | POA: Diagnosis present

## 2019-12-31 DIAGNOSIS — J811 Chronic pulmonary edema: Secondary | ICD-10-CM | POA: Diagnosis not present

## 2019-12-31 DIAGNOSIS — Z818 Family history of other mental and behavioral disorders: Secondary | ICD-10-CM | POA: Diagnosis not present

## 2019-12-31 DIAGNOSIS — M1990 Unspecified osteoarthritis, unspecified site: Secondary | ICD-10-CM | POA: Diagnosis present

## 2019-12-31 DIAGNOSIS — Z8249 Family history of ischemic heart disease and other diseases of the circulatory system: Secondary | ICD-10-CM

## 2019-12-31 DIAGNOSIS — Z833 Family history of diabetes mellitus: Secondary | ICD-10-CM | POA: Diagnosis not present

## 2019-12-31 DIAGNOSIS — Z96653 Presence of artificial knee joint, bilateral: Secondary | ICD-10-CM | POA: Diagnosis present

## 2019-12-31 DIAGNOSIS — Z6841 Body Mass Index (BMI) 40.0 and over, adult: Secondary | ICD-10-CM | POA: Diagnosis not present

## 2019-12-31 DIAGNOSIS — G4733 Obstructive sleep apnea (adult) (pediatric): Secondary | ICD-10-CM | POA: Diagnosis present

## 2019-12-31 DIAGNOSIS — E669 Obesity, unspecified: Secondary | ICD-10-CM | POA: Diagnosis not present

## 2019-12-31 DIAGNOSIS — R0600 Dyspnea, unspecified: Secondary | ICD-10-CM | POA: Diagnosis not present

## 2019-12-31 DIAGNOSIS — R0789 Other chest pain: Secondary | ICD-10-CM | POA: Diagnosis not present

## 2019-12-31 DIAGNOSIS — I951 Orthostatic hypotension: Secondary | ICD-10-CM | POA: Diagnosis not present

## 2019-12-31 DIAGNOSIS — R079 Chest pain, unspecified: Secondary | ICD-10-CM | POA: Diagnosis not present

## 2019-12-31 DIAGNOSIS — K219 Gastro-esophageal reflux disease without esophagitis: Secondary | ICD-10-CM | POA: Diagnosis not present

## 2019-12-31 DIAGNOSIS — Z923 Personal history of irradiation: Secondary | ICD-10-CM | POA: Diagnosis not present

## 2019-12-31 DIAGNOSIS — F319 Bipolar disorder, unspecified: Secondary | ICD-10-CM | POA: Diagnosis present

## 2019-12-31 DIAGNOSIS — Z9221 Personal history of antineoplastic chemotherapy: Secondary | ICD-10-CM | POA: Diagnosis not present

## 2019-12-31 DIAGNOSIS — F419 Anxiety disorder, unspecified: Secondary | ICD-10-CM | POA: Diagnosis not present

## 2019-12-31 DIAGNOSIS — I10 Essential (primary) hypertension: Secondary | ICD-10-CM | POA: Diagnosis not present

## 2019-12-31 DIAGNOSIS — Z20822 Contact with and (suspected) exposure to covid-19: Secondary | ICD-10-CM | POA: Diagnosis present

## 2019-12-31 DIAGNOSIS — F3170 Bipolar disorder, currently in remission, most recent episode unspecified: Secondary | ICD-10-CM | POA: Diagnosis not present

## 2019-12-31 DIAGNOSIS — I110 Hypertensive heart disease with heart failure: Secondary | ICD-10-CM | POA: Diagnosis present

## 2019-12-31 DIAGNOSIS — Z853 Personal history of malignant neoplasm of breast: Secondary | ICD-10-CM

## 2019-12-31 DIAGNOSIS — R0609 Other forms of dyspnea: Secondary | ICD-10-CM | POA: Diagnosis present

## 2019-12-31 DIAGNOSIS — Z888 Allergy status to other drugs, medicaments and biological substances status: Secondary | ICD-10-CM

## 2019-12-31 LAB — HEPATIC FUNCTION PANEL
ALT: 25 U/L (ref 0–44)
AST: 27 U/L (ref 15–41)
Albumin: 4.2 g/dL (ref 3.5–5.0)
Alkaline Phosphatase: 87 U/L (ref 38–126)
Bilirubin, Direct: 0.1 mg/dL (ref 0.0–0.2)
Indirect Bilirubin: 0.5 mg/dL (ref 0.3–0.9)
Total Bilirubin: 0.6 mg/dL (ref 0.3–1.2)
Total Protein: 7.3 g/dL (ref 6.5–8.1)

## 2019-12-31 LAB — BASIC METABOLIC PANEL
Anion gap: 12 (ref 5–15)
BUN: 15 mg/dL (ref 8–23)
CO2: 28 mmol/L (ref 22–32)
Calcium: 9.6 mg/dL (ref 8.9–10.3)
Chloride: 99 mmol/L (ref 98–111)
Creatinine, Ser: 0.82 mg/dL (ref 0.44–1.00)
GFR calc Af Amer: >60 mL/min (ref >60)
GFR calc non Af Amer: >60 mL/min (ref >60)
Glucose, Bld: 114 mg/dL — ABNORMAL HIGH (ref 70–99)
Potassium: 3.9 mmol/L (ref 3.5–5.1)
Sodium: 139 mmol/L (ref 135–145)

## 2019-12-31 LAB — CBC
HCT: 42.5 % (ref 36.0–46.0)
Hemoglobin: 13.7 g/dL (ref 12.0–15.0)
MCH: 31.8 pg (ref 26.0–34.0)
MCHC: 32.2 g/dL (ref 30.0–36.0)
MCV: 98.6 fL (ref 80.0–100.0)
Platelets: 255 10*3/uL (ref 150–400)
RBC: 4.31 MIL/uL (ref 3.87–5.11)
RDW: 13.1 % (ref 11.5–15.5)
WBC: 5.2 10*3/uL (ref 4.0–10.5)
nRBC: 0 % (ref 0.0–0.2)

## 2019-12-31 LAB — LIPASE, BLOOD: Lipase: 18 U/L (ref 11–51)

## 2019-12-31 LAB — SARS CORONAVIRUS 2 BY RT PCR (HOSPITAL ORDER, PERFORMED IN ~~LOC~~ HOSPITAL LAB): SARS Coronavirus 2: NEGATIVE

## 2019-12-31 LAB — TROPONIN I (HIGH SENSITIVITY)
Troponin I (High Sensitivity): 4 ng/L (ref <18)
Troponin I (High Sensitivity): 4 ng/L (ref <18)

## 2019-12-31 MED ORDER — NITROGLYCERIN 0.4 MG SL SUBL: 0.4000 mg | SUBLINGUAL_TABLET | SUBLINGUAL | Status: DC | PRN

## 2019-12-31 MED ORDER — ENOXAPARIN SODIUM 60 MG/0.6ML ~~LOC~~ SOLN
60.0000 mg | SUBCUTANEOUS | Status: DC
  Administered 2019-12-31: 60 mg via SUBCUTANEOUS
  Filled 2019-12-31: qty 0.6

## 2019-12-31 MED ORDER — ACETAMINOPHEN 325 MG PO TABS
650.0000 mg | ORAL_TABLET | Freq: Four times a day (QID) | ORAL | Status: DC | PRN
  Administered 2019-12-31 – 2020-01-01 (×2): 650 mg via ORAL
  Filled 2019-12-31 (×2): qty 2

## 2019-12-31 MED ORDER — ONDANSETRON HCL 4 MG/2ML IJ SOLN
4.0000 mg | Freq: Once | INTRAMUSCULAR | Status: AC
  Administered 2019-12-31: 4 mg via INTRAVENOUS
  Filled 2019-12-31: qty 2

## 2019-12-31 MED ORDER — FUROSEMIDE 10 MG/ML IJ SOLN
20.0000 mg | Freq: Two times a day (BID) | INTRAMUSCULAR | Status: AC
  Administered 2019-12-31 – 2020-01-01 (×2): 20 mg via INTRAVENOUS
  Filled 2019-12-31 (×2): qty 2

## 2019-12-31 MED ORDER — ASPIRIN 81 MG PO CHEW
243.0000 mg | CHEWABLE_TABLET | Freq: Once | ORAL | Status: AC
  Administered 2019-12-31: 243 mg via ORAL
  Filled 2019-12-31: qty 3

## 2019-12-31 MED ORDER — ZOLPIDEM TARTRATE 5 MG PO TABS: 5.0000 mg | ORAL_TABLET | Freq: Every evening | ORAL | Status: DC | PRN

## 2019-12-31 MED ORDER — POLYETHYLENE GLYCOL 3350 17 G PO PACK: 17.0000 g | PACK | Freq: Every day | ORAL | Status: DC | PRN

## 2019-12-31 MED ORDER — GLUCOSAMINE-CHONDROITIN 500-400 MG PO TABS: 2.0000 | ORAL_TABLET | Freq: Every morning | ORAL | Status: DC

## 2019-12-31 MED ORDER — ACETAMINOPHEN 650 MG RE SUPP: 650.0000 mg | Freq: Four times a day (QID) | RECTAL | Status: DC | PRN

## 2019-12-31 MED ORDER — ONDANSETRON HCL 4 MG PO TABS: 4.0000 mg | ORAL_TABLET | Freq: Four times a day (QID) | ORAL | Status: DC | PRN

## 2019-12-31 MED ORDER — LAMOTRIGINE 100 MG PO TABS
400.0000 mg | ORAL_TABLET | Freq: Every day | ORAL | Status: DC
  Administered 2019-12-31 – 2020-01-01 (×2): 400 mg via ORAL
  Filled 2019-12-31: qty 4

## 2019-12-31 MED ORDER — SODIUM CHLORIDE 0.9 % IV SOLN: 250.0000 mL | INTRAVENOUS | Status: DC | PRN

## 2019-12-31 MED ORDER — ALPRAZOLAM ER 1 MG PO TB24: 1.0000 mg | ORAL_TABLET | Freq: Every morning | ORAL | Status: DC

## 2019-12-31 MED ORDER — METOPROLOL TARTRATE 5 MG/5ML IV SOLN: 5.0000 mg | Freq: Four times a day (QID) | INTRAVENOUS | Status: DC | PRN

## 2019-12-31 MED ORDER — ALPRAZOLAM 0.5 MG PO TABS
0.5000 mg | ORAL_TABLET | Freq: Two times a day (BID) | ORAL | Status: DC | PRN
  Administered 2019-12-31: 1 mg via ORAL
  Filled 2019-12-31: qty 2

## 2019-12-31 MED ORDER — ONDANSETRON HCL 4 MG/2ML IJ SOLN: 4.0000 mg | Freq: Four times a day (QID) | INTRAMUSCULAR | Status: DC | PRN

## 2019-12-31 MED ORDER — AMLODIPINE BESYLATE 5 MG PO TABS
5.0000 mg | ORAL_TABLET | Freq: Every day | ORAL | Status: DC
  Administered 2019-12-31: 5 mg via ORAL
  Filled 2019-12-31 (×2): qty 1

## 2019-12-31 MED ORDER — SODIUM CHLORIDE 0.9% FLUSH
3.0000 mL | Freq: Two times a day (BID) | INTRAVENOUS | Status: DC
  Administered 2019-12-31 – 2020-01-01 (×2): 3 mL via INTRAVENOUS

## 2019-12-31 MED ORDER — CALCIUM CARBONATE ANTACID 500 MG PO CHEW: 1.0000 | CHEWABLE_TABLET | Freq: Every day | ORAL | Status: DC | PRN

## 2019-12-31 MED ORDER — QUETIAPINE FUMARATE 50 MG PO TABS
400.0000 mg | ORAL_TABLET | Freq: Every day | ORAL | Status: DC
  Administered 2019-12-31 – 2020-01-01 (×2): 400 mg via ORAL
  Filled 2019-12-31: qty 4
  Filled 2019-12-31: qty 8

## 2019-12-31 MED ORDER — SODIUM CHLORIDE 0.9% FLUSH: 3.0000 mL | INTRAVENOUS | Status: DC | PRN

## 2019-12-31 MED ORDER — NITROGLYCERIN 0.4 MG SL SUBL
0.4000 mg | SUBLINGUAL_TABLET | Freq: Once | SUBLINGUAL | Status: AC
  Administered 2019-12-31: 0.4 mg via SUBLINGUAL
  Filled 2019-12-31: qty 1

## 2019-12-31 MED ORDER — LAMOTRIGINE 200 MG PO TABS
400.0000 mg | ORAL_TABLET | Freq: Every day | ORAL | Status: DC
  Filled 2019-12-31: qty 2
  Filled 2019-12-31: qty 4
  Filled 2019-12-31: qty 2

## 2019-12-31 NOTE — Progress Notes (Signed)
PHARMACIST - PHYSICIAN ORDER COMMUNICATION  CONCERNING: P&T Medication Policy on Herbal Medications  DESCRIPTION:  This patients order for:  Glucosamine-chondroitin  has been noted.  This product(s) is classified as an herbal or natural product. Due to a lack of definitive safety studies or FDA approval, nonstandard manufacturing practices, plus the potential risk of unknown drug-drug interactions while on inpatient medications, the Pharmacy and Therapeutics Committee does not permit the use of herbal or natural products of this type within [facility].   ACTION TAKEN: The pharmacy department is unable to verify this order at this time. Please reevaluate patients clinical condition at discharge and address if the herbal or natural product(s) should be resumed at that time.  Lenis Noon, PharmD 12/31/19 10:42 PM

## 2019-12-31 NOTE — Consult Note (Signed)
Cardiology Consultation:   Patient ID: [name] MRN: [id number]; DOB: 1950-08-27  Admit date: 12/31/2019 Date of Consult: 12/31/2019  Primary Care Provider: Kathyrn Drown, MD [facility] Cardiologist: Dr Reymundo Poll [facility] Electrophysiologist:  Cristopher Peru, MD    Patient Profile:   [name] is a 69 y.o. female with a hx of sinus dysfunction with pacemaker who is being seen today for the evaluation of SOB at the request of Dr Laverta Baltimore  History of Present Illness:   [name] 69 yo female history of sinus node dysfunction with pacemaker, bipolar, chronic diastolic HF. This is 3rd ER visit in last month for SOB. She was admitted in 11/2019 and manged for diastolic HF. Seen back in ER 6/17 and today for recurrent symptoms. Has been seen as outpatient by PA Leonides Sake for ongoing symptoms who arranged stress test as reported below. Start on prn SL NG, norvasc 5mg  daily.    She reports 1-2 months of progressing SOB/DOE. DOE with walking distances like from the parking lot into the grocery. Can get exertional chest tightness midchest, mild 2/10 in severity, resolves with rest. No recent LE edema. Has had some recent orthopnea.    K 3.9 Cr 0.82 BUN 15 WBC 5.2 Hgb 13.7 Plt 255  hstrop 4--> CXR mild central pulm congestion 11/2019 echo LVEF 99-37%, grade I diastolic dysfunction, normal RV, normal IVC 12/2019 nuclear stress; small moderate intensity apical anterior defect consistent with ischemia, though some breast attenuation noted.      Past Medical History:  Diagnosis Date  . Anxiety   . Bipolar 1 disorder ([place])   . 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 Breast cancer, left breast ([place]) 07/22/2011  . Cancer ([place])   . CHF (congestive heart failure) ([place])   . Depression   . High cholesterol   . Hypertension   . IBS (irritable bowel syndrome)   . Impaired fasting glucose   . Memory loss   . OA (osteoarthritis)   .  Obesity   . Personal history of chemotherapy 2009  . Personal history of radiation therapy 2009  . Polycystic ovarian disease   . 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 Sleep apnea     Past Surgical History:  Procedure Laterality Date  . ABDOMINAL HYSTERECTOMY    . APPENDECTOMY    . BREAST BIOPSY Left 2011  . BREAST BIOPSY  2008  . BREAST LUMPECTOMY Left 2008  . CHOLECYSTECTOMY    . COLONOSCOPY N/A 09/13/2013   Procedure: COLONOSCOPY;  Surgeon: [name], MD;  [facility];  Service: Endoscopy;  Laterality: N/A;  730  . EP IMPLANTABLE DEVICE N/A 01/06/2016   Procedure: Pacemaker Implant;  Surgeon: [name], MD;  Location: [place] CV LAB;  Service: Cardiovascular;  Laterality: N/A;  . ESOPHAGOGASTRODUODENOSCOPY N/A 01/17/2014   Procedure: ESOPHAGOGASTRODUODENOSCOPY (EGD);  Surgeon: [name], MD;  [facility];  Service: Endoscopy;  Laterality: N/A;  245  . TOTAL KNEE ARTHROPLASTY Bilateral right knee   2012, 2007     Inpatient Medications: Scheduled Meds: . nitroGLYCERIN  0.4 mg Sublingual Once  . ondansetron  4 mg Intravenous Once   Continuous Infusions:  PRN Meds:   Allergies:    Allergies  Allergen Reactions  . Imipramine Hives and Rash  . Tricor [Fenofibrate] Other (See Comments)    Pain, "aching"    Social History:   Social History   Socioeconomic History  .  Marital status: Married    Spouse name: Not on file  . Number of children: 3  . Years of education: college  . Highest education level: Associate degree: occupational, Hotel manager, or vocational program  Occupational History  . Occupation: Therapist, sports  Tobacco Use  . Smoking status: Never Smoker  . Smokeless tobacco: Never Used  Vaping Use  . Vaping Use: Never used  Substance and Sexual Activity  . Alcohol use: No    Alcohol/week: 0.0 standard drinks  . Drug use: No  . Sexual activity: Never  Other Topics Concern  . Not on file  Social History  Narrative   04/24/2013 [name] was born and grew up in [place]. She reports that her childhood was "tough." She has 2 older sisters and a younger brother. She achieved an Associates Degree in nursing. 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 She has 2 daughters and one son. She denies any legal difficulties. She affiliates as Engineer, manufacturing. Her hobbies include reading, sewing, crafts, and cooking. She reports that her social support system consists of her friend and passed her. 04/24/2013 [name]      Lives alone.   Right-handed.   No caffeine use.   Social Determinants of Health   Financial Resource Strain:   . Difficulty of Paying Living Expenses:   Food Insecurity:   . Worried About Charity fundraiser in the Last Year:   . Arboriculturist in the Last Year:   Transportation Needs:   . Film/video editor (Medical):   [name] Lack of Transportation (Non-Medical):   Physical Activity:   . Days of Exercise per Week:   . Minutes of Exercise per Session:   Stress:   . Feeling of Stress :   Social Connections:   . Frequency of Communication with Friends and Family:   . Frequency of Social Gatherings with Friends and Family:   . Attends Religious Services:   . Active Member of Clubs or Organizations:   . Attends Archivist Meetings:   [name] Marital Status:   Intimate Partner Violence:   . Fear of Current or Ex-Partner:   . Emotionally Abused:   [name] Physically Abused:   . Sexually Abused:     Family History:    Family History  Problem Relation Age of Onset  . Bipolar disorder Mother   . Diabetes Mother   . Bipolar disorder Sister   . Diabetes Sister   . Alcohol abuse Father   . Hypertension Father   . Heart disease Other      ROS:  Please see the history of present illness.  All other ROS reviewed and negative.     Physical Exam/Data:   Vitals:   12/31/19 1322 12/31/19 1400 12/31/19 1500  BP: (!) 154/80 (!)  141/67 (!) 160/68  Pulse: 64 66 63  Resp: 18 12 19   Temp: 97.7 F (36.5 C)    TempSrc: Oral    SpO2: 95% 97% 94%  Weight: 128 kg    Height: 5\' 4"  (1.626 m)     No intake or output data in the 24 hours ending 12/31/19 1535 Last 3 Weights 12/31/2019 12/12/2019 12/06/2019  Weight (lbs) 282 lb 3 oz 283 lb 258 lb  Weight (kg) 128 kg 128.368 kg 117.028 kg     Body mass index is 48.44 kg/m.  General:  Well nourished, well developed, in no acute distress HEENT:  normal Lymph: no adenopathy Neck: no JVD Endocrine:  No thryomegaly Vascular: No carotid bruits; FA pulses 2+ bilaterally without bruits  Cardiac:  normal S1, S2; RRR; no murmur  Lungs:  clear to auscultation bilaterally, no wheezing, rhonchi or rales  Abd: soft, nontender, no hepatomegaly  Ext: no edema Musculoskeletal:  No deformities, BUE and BLE strength normal and equal Skin: warm and dry  Neuro:  CNs 2-12 intact, no focal abnormalities noted Psych:  Normal affect     Laboratory Data:  High Sensitivity Troponin:   Recent Labs  Lab 12/04/19 2329 12/05/19 0136 12/05/19 1802 12/05/19 1941 12/31/19 1353  TROPONINIHS 7 6 5 5 4      Chemistry Recent Labs  Lab 12/31/19 1353  NA 139  K 3.9  CL 99  CO2 28  GLUCOSE 114*  BUN 15  CREATININE 0.82  CALCIUM 9.6  GFRNONAA >60  GFRAA >60  ANIONGAP 12    No results for input(s): PROT, ALBUMIN, AST, ALT, ALKPHOS, BILITOT in the last 168 hours. Hematology Recent Labs  Lab 12/31/19 1353  WBC 5.2  RBC 4.31  HGB 13.7  HCT 42.5  MCV 98.6  MCH 31.8  MCHC 32.2  RDW 13.1  PLT 255   BNPNo results for input(s): BNP, PROBNP in the last 168 hours.  DDimer No results for input(s): DDIMER in the last 168 hours.   Radiology/Studies:  DG Chest 2 View  Result Date: 12/31/2019 CLINICAL DATA:  Chest pain. Additional history provided: Shortness of breath this morning. EXAM: CHEST - 2 VIEW COMPARISON:  Prior chest radiograph 12/27/2019 and earlier FINDINGS: Redemonstrated  left chest dual lead pacer/AICD device. Heart size within normal limits. Mild central pulmonary vascular congestion appears similar to prior examination. No evidence of airspace consolidation within the lungs. No frank pulmonary edema. No evidence of pleural effusion or pneumothorax. No acute bony abnormality identified. Thoracic spondylosis. Eventration of the right hemidiaphragm, chronic and unchanged. IMPRESSION: Mild central pulmonary vascular congestion, unchanged. No evidence of airspace consolidation or frank pulmonary edema. Electronically Signed   By: Kellie Simmering DO   On: 12/31/2019 14:59   {   Assessment and Plan:   1. SOB/Chest pain - difficult interpretation of her nuclear stress given her BMI of 48, did suggest some apical anterior ischemia though some evidence of breast attenuation. - difficult assessment of her true volume status on exam given her body habitus. BNP from last admit may be underpresentative given diastolic HF and obesity   - overall mixed symptoms with mixed/limited data thus far in a patient with progressing symptoms - would consider RHC/LHC for defintive evaluation of her coronaries and filling pressures. RHC could also look at her PA pressures  given her known OSA, her obesity also puts her at risk for OHS.    Would monitor at [facility] overnight, pending additional labs results would plan on RHC/LHC tomorrow. I discussed with patient and she is in agreement.  - Rounding team tomorrow will need to contact [facility] to schedule and place orders   2. Nausea - unclear etiology, f/u lipase and liver enzymes  3.Dizziness - seems to have increased since starting norvasc - she reports adequate fluid hydration - check orthostatics   I have reviewed the risks, indications, and alternatives to cardiac catheterization, possible angioplasty, and stenting with the patient today. Risks include but are not limited to bleeding, infection, vascular injury, stroke, myocardial  infection, arrhythmia, kidney injury, radiation-related injury in the case of prolonged fluoroscopy use, emergency cardiac surgery,  and death. The patient understands the risks of serious complication is 1-2 in 6754 with diagnostic cardiac cath and 1-2% or less with angioplasty/stenting.   For questions or updates, please contact [place] Please consult [email or web address] for contact info under    Signed, Carlyle Dolly, MD  12/31/2019 3:35 PM

## 2019-12-31 NOTE — H&P (Addendum)
[facility] History & Physical  [name] MD  [name] 12/31/2019  Chief Complaint: Chest pain HPI: The patient is a 69 y.o. year-old w/ hx of diast CHF admitted for diuresis in May 2021. Now presenting w/ chest pain x 1-2 days, worsening today. Also some recent nausea, fatigue for 1-2 wks.  Says recent stress test was "plus or minus". Cardiology is evaluating and notes that w/ progressive symptoms and difficult interpretation of recent stress test, suggests admit here overnight w/ plans to schedule her for LHC/ R[facility] at [facility] for tomorrow. We are asked to see for admit.   Pt denies fever, chills, prod cough.  CP is anterior. Lots of stress, her husband x 46 hrs is bipolar and lives in a group home. Does c/o dizziness w/ standing, orthostatics in ED were not abnormal.    No n/v/d, no voiding issues.    Admit 5/25- 12/06/19 here -- h/o PPM, bipolar/ anxiety came in w/ SOB while working out at [employer].  Wheezing x past weeks. CXR suggested IS edema, BNP wnl, trop neg and normal EKG. No CP. Admit , IV lasix w/ good diuresis. SOB improved. ECHO w/ G1DD. DC"d on low dose lasix.  D/ w cards, outpt ischemic w/u planned. [place] home.   ROS  no joint pain   no HA  no blurry vision  no rash  no diarrhea  no nausea/ vomiting  no dysuria  no difficulty voiding  no change in urine color   Past Medical History  Past Medical History:  Diagnosis Date  . Anxiety   . Bipolar 1 disorder ([place])   . 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 Breast cancer, left breast ([place]) 07/22/2011  . Cancer ([place])   . CHF (congestive heart failure) ([place])   . Depression   . High cholesterol   . Hypertension   . IBS (irritable bowel syndrome)   . Impaired fasting glucose   . Memory loss   . OA (osteoarthritis)   . Obesity   . Personal history of chemotherapy 2009  . Personal history of radiation therapy 2009  . Polycystic ovarian disease   . Presence of  permanent cardiac pacemaker 01/06/2016  . Sleep apnea    Past Surgical History  Past Surgical History:  Procedure Laterality Date  . ABDOMINAL HYSTERECTOMY    . APPENDECTOMY    . BREAST BIOPSY Left 2011  . BREAST BIOPSY  2008  . BREAST LUMPECTOMY Left 2008  . CHOLECYSTECTOMY    . COLONOSCOPY N/A 09/13/2013   Procedure: COLONOSCOPY;  Surgeon: [name], MD;  [facility];  Service: Endoscopy;  Laterality: N/A;  730  . EP IMPLANTABLE DEVICE N/A 01/06/2016   Procedure: Pacemaker Implant;  Surgeon: [name], MD;  Location: [place] CV LAB;  Service: Cardiovascular;  Laterality: N/A;  . ESOPHAGOGASTRODUODENOSCOPY N/A 01/17/2014   Procedure: ESOPHAGOGASTRODUODENOSCOPY (EGD);  Surgeon: [name], MD;  [facility];  Service: Endoscopy;  Laterality: N/A;  245  . TOTAL KNEE ARTHROPLASTY Bilateral right knee   2012, 2007   Family History  Family History  Problem Relation Age of Onset  . Bipolar disorder Mother   . Diabetes Mother   . Bipolar disorder Sister   . Diabetes Sister   . Alcohol abuse Father   . Hypertension Father   . Heart disease Other    Social History  reports that she has never smoked. She has never  used smokeless tobacco. She reports that she does not drink alcohol and does not use drugs. Allergies  Allergies  Allergen Reactions  . Imipramine Hives and Rash  . Tricor [Fenofibrate] Other (See Comments)    Pain, "aching"   Home medications Prior to Admission medications   Medication Sig Start Date End Date Taking? Authorizing Provider  acetaminophen (TYLENOL 8 HOUR) 650 MG CR tablet Take 650-1,300 mg by mouth See admin instructions. 1300MG  IN THE MORNING AND 650MG  AT BEDTIME   Yes [provider]  ALPRAZolam (XANAX XR) 1 MG 24 hr tablet Take 1 mg by mouth every morning.    Yes [provider]  ALPRAZolam [name]) 1 MG tablet Take 0.5 mg by mouth daily as needed for anxiety.    Yes [provider]   amLODipine (NORVASC) 5 MG tablet Take 1 tablet (5 mg total) by mouth daily. 12/27/19 03/26/20 Yes Verta Ellen., NP  calcium carbonate (TUMS - DOSED IN MG ELEMENTAL CALCIUM) 500 MG chewable tablet Chew 1-3 tablets by mouth daily as needed for indigestion or heartburn.   Yes [provider]  furosemide (LASIX) 20 MG tablet Take 1 tablet (20 mg total) by mouth daily. Start on 5/30 12/06/19 12/05/20 Yes Kathie Dike, MD  glucosamine-chondroitin 500-400 MG tablet Take 2 tablets by mouth every morning.    Yes [provider]  lamoTRIgine (LAMICTAL) 200 MG tablet Take 400 mg by mouth at bedtime.    Yes [provider]  nitroGLYCERIN (NITROSTAT) 0.4 MG SL tablet Place 1 tablet (0.4 mg total) under the tongue every 5 (five) minutes as needed. 12/27/19  Yes Verta Ellen., NP  QUEtiapine (SEROQUEL) 200 MG tablet Take 400 mg by mouth at bedtime.  05/07/13  Yes [name], MD  Vitamin D, Ergocalciferol, (DRISDOL) 50000 units CAPS capsule Take 50,000 Units by mouth every Sunday.    Yes [provider]       Exam Gen alert, obese WF, no distress No rash, cyanosis or gangrene Sclera anicteric, throat clear  No jvd or bruits Chest clear bilat to bases, no rales or wheezing, good air movement RRR no MRG Abd soft ntnd no mass or ascites +bs GU defer MS no joint effusions or deformity Ext no pitting LE edema, no wounds or ulcers Neuro is alert, Ox 3 , nf     Home meds:  - norvasc 5 qd/ lasix 20 qd/ sl ntg prn  - seroquel 400 hs/ xanax 1mg  qam + 0.5mg  prn/ lamictal 400 hs   - prn's/ vitamins/ supplements   Labs wnl except glu 114   CXR 6/21 - pulm vasc congestion   BP 140 / 68, HR 65 RR 20 97.7  94- 98 % on RA   Rx here - asa 243mg     12/05/19 - LVEF 65-70%, mild LVH, G1DD. No valve disease.     Assessment/ Plan: 1. Chest pain - progressive, recent stress results difficult to interpret.  Trop neg. Seen by cardiology, plan admit here and  the rounding cardiologist tomorrow will need to schedule her for heart cath at [facility].  2. Anxiety - cont home med Xanax 1mg  xl qd and prn another 1mg  bid 3. Obesity 4. Diast CHF - cxr appearance w/ vasc congestion again as on prior admit whereupon they diuresed her for 24 hrs. Will give IV lasix 20 mg bid x 2.  5. Bipolar d/o - cont meds 6. HTN - norvasc, IV lasix 7. H/o L breast Ca (  2013)    [name]  MD 12/31/2019, 3:34 PM

## 2019-12-31 NOTE — ED Provider Notes (Signed)
Emergency Department Provider Note   I have reviewed the triage vital signs and the nursing notes.   HISTORY  Chief Complaint Chest Pain   HPI [name] is a 69 y.o. female with past history reviewed below presents to the emergency department with worsening chest pain, shortness of breath, nausea. Patient has been dealing with the above symptoms for 1 to 2 weeks. She states over that time she has been dealing more with fatigue and some nausea along with shortness of breath. She will have occasional chest tightness/heaviness but nothing persistent. This morning she is developed more persistent heaviness in the chest along with the nausea and shortness of breath symptoms. She has somnolence which is also been present for some time. She follows with cardiology and  there has been some discussion recently about ischemic type work-up and heart cath but nothing scheduled as of yet. She is having some active, very mild, chest heaviness. She is also experiencing nausea but no abdominal pain. No fevers or chills. She has been vaccinated for COVID. She took an 81 mg ASA this AM but no Nitro.    Past Medical History:  Diagnosis Date  . Anxiety   . Bipolar 1 disorder ([place])   . 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 Breast cancer, left breast ([place]) 07/22/2011  . Cancer ([place])   . CHF (congestive heart failure) ([place])   . Depression   . High cholesterol   . Hypertension   . IBS (irritable bowel syndrome)   . Impaired fasting glucose   . Memory loss   . OA (osteoarthritis)   . Obesity   . Personal history of chemotherapy 2009  . Personal history of radiation therapy 2009  . Polycystic ovarian disease   . 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 Sleep apnea     Patient Active Problem List   Diagnosis Date Noted  . Precordial chest pain 12/31/2019  . Dyspnea 12/05/2019  . Acute on chronic diastolic (congestive) heart failure  ([place]) 12/05/2019  . Memory loss 12/12/2017  . Morbid obesity ([place]) 09/05/2017  . Sinus node dysfunction ([place]) 01/06/2016  . Bradycardia 10/22/2015  . Bipolar disorder ([place]) 10/22/2015  . Anxiety 10/22/2015  . Pre-syncope 10/22/2015  . Episodic lightheadedness   . GERD (gastroesophageal reflux disease) 01/08/2014  . Hypertension 11/28/2013  . Palpitations 11/28/2013  . Osteoarthritis 11/08/2013  . Lumbar back pain 11/08/2013  . Hypertriglyceridemia 08/06/2013  . Depression 04/20/2013  . History of left breast cancer (2013) 07/22/2011    Past Surgical History:  Procedure Laterality Date  . ABDOMINAL HYSTERECTOMY    . APPENDECTOMY    . BREAST BIOPSY Left 2011  . BREAST BIOPSY  2008  . BREAST LUMPECTOMY Left 2008  . CHOLECYSTECTOMY    . COLONOSCOPY N/A 09/13/2013   Procedure: COLONOSCOPY;  Surgeon: [name], MD;  [facility];  Service: Endoscopy;  Laterality: N/A;  730  . EP IMPLANTABLE DEVICE N/A 01/06/2016   Procedure: Pacemaker Implant;  Surgeon: [name], MD;  Location: [facility];  Service: Cardiovascular;  Laterality: N/A;  . ESOPHAGOGASTRODUODENOSCOPY N/A 01/17/2014   Procedure: ESOPHAGOGASTRODUODENOSCOPY (EGD);  Surgeon: [name], MD;  Location: [facility];  Service: Endoscopy;  Laterality: N/A;  245  . TOTAL KNEE ARTHROPLASTY Bilateral right knee   2012, 2007    Allergies Imipramine and Tricor [fenofibrate]  Family History  Problem Relation Age of Onset  . Bipolar  disorder Mother   . Diabetes Mother   . Bipolar disorder Sister   . Diabetes Sister   . Alcohol abuse Father   . Hypertension Father   . Heart disease Other     Social History Social History   Tobacco Use  . Smoking status: Never Smoker  . Smokeless tobacco: Never Used  Vaping Use  . Vaping Use: Never used  Substance Use Topics  . Alcohol use: No    Alcohol/week: 0.0 standard drinks  . Drug use: No    Review of Systems  Constitutional: No  fever/chills Eyes: No visual changes. ENT: No sore throat. Cardiovascular: Positive chest pain. Respiratory: Positive shortness of breath. Gastrointestinal: No abdominal pain. Positive nausea, no vomiting.  No diarrhea.  No constipation. Genitourinary: Negative for dysuria. Musculoskeletal: Negative for back pain. Skin: Negative for rash. Neurological: Negative for headaches, focal weakness or numbness.  10-point ROS otherwise negative.  ____________________________________________   PHYSICAL EXAM:  VITAL SIGNS: ED Triage Vitals [12/31/19 1322]  Enc Vitals Group     BP (!) 154/80     Pulse Rate 64     Resp 18     Temp 97.7 F (36.5 C)     Temp Source Oral     SpO2 95 %     Weight 282 lb 3 oz (128 kg)     Height 5\' 4"  (1.626 m)   Constitutional: Alert and oriented. Well appearing and in no acute distress. Eyes: Conjunctivae are normal.  Head: Atraumatic. Nose: No congestion/rhinnorhea. Mouth/Throat: Mucous membranes are moist.  Neck: No stridor.   Cardiovascular: Normal rate, regular rhythm. Good peripheral circulation. Grossly normal heart sounds.   Respiratory: Normal respiratory effort.  No retractions. Lungs CTAB. Gastrointestinal: Soft and nontender. No distention.  Musculoskeletal: No lower extremity tenderness with trace B/L LE edema. No gross deformities of extremities. Neurologic:  Normal speech and language.  Skin:  Skin is warm, dry and intact. No rash noted.   ____________________________________________   LABS (all labs ordered are listed, but only abnormal results are displayed)  Labs Reviewed  BASIC METABOLIC PANEL - Abnormal; Notable for the following components:      Result Value   Glucose, Bld 114 (*)    All other components within normal limits  SARS CORONAVIRUS 2 BY RT PCR (HOSPITAL ORDER, [facility])  CBC  LIPASE, BLOOD  HEPATIC FUNCTION PANEL  TROPONIN I (HIGH SENSITIVITY)  TROPONIN I (HIGH SENSITIVITY)    ____________________________________________  EKG   EKG Interpretation  Date/Time:  Monday December 31 2019 13:26:25 EDT Ventricular Rate:  63 PR Interval:  222 QRS Duration: 80 QT Interval:  398 QTC Calculation: 407 R Axis:   -24 Text Interpretation: Atrial-paced rhythm with prolonged AV conduction Minimal voltage criteria for LVH, may be normal variant ( R in aVL ) Abnormal ECG No STEMI Confirmed by Nanda Quinton [phone]) on 12/31/2019 2:13:27 PM       ____________________________________________  RADIOLOGY  DG Chest 2 View  Result Date: 12/31/2019 CLINICAL DATA:  Chest pain. Additional history provided: Shortness of breath this morning. EXAM: CHEST - 2 VIEW COMPARISON:  Prior chest radiograph 12/27/2019 and earlier FINDINGS: Redemonstrated left chest dual lead pacer/AICD device. Heart size within normal limits. Mild central pulmonary vascular congestion appears similar to prior examination. No evidence of airspace consolidation within the lungs. No frank pulmonary edema. No evidence of pleural effusion or pneumothorax. No acute bony abnormality identified. Thoracic spondylosis. Eventration of the right hemidiaphragm, chronic and unchanged.  IMPRESSION: Mild central pulmonary vascular congestion, unchanged. No evidence of airspace consolidation or frank pulmonary edema. Electronically Signed   By: Kellie Simmering DO   On: 12/31/2019 14:59    ____________________________________________   PROCEDURES  Procedure(s) performed:   Procedures  None  ____________________________________________   INITIAL IMPRESSION / ASSESSMENT AND PLAN / ED COURSE  Pertinent labs & imaging results that were available during my care of the patient were reviewed by me and considered in my medical decision making (see chart for details).   Patient with past history reviewed below presents to the emergency department with nausea, chest discomfort, shortness of breath symptoms. Some of these symptoms have  been ongoing but chest discomfort is worsening today. She is having very mild chest discomfort here improved from earlier today but remains present. I will give aspirin to equal a full dose and nitroglycerin to control pain. Patient's initial troponin is within normal limits. Will discuss symptoms with Cardiology on call to discuss admit with possible heart cath as inpatient with worsening pain symptoms today.   03:14 PM  Spoke with Dr. Harl Bowie with Cardiology.  We reviewed the recent stress test and symptoms.  Initial troponin is negative.  No heparin for now but they will consult.  Plan is for overnight observation here at [facility] and they can direct to the admitting team as to if/when transfer and heart cath will be advised.   Discussed patient's case with [facility] to request admission. Patient and family (if present) updated with plan. Care transferred to [facility] service.  I reviewed all nursing notes, vitals, pertinent old records, EKGs, labs, imaging (as available).  ____________________________________________  FINAL CLINICAL IMPRESSION(S) / ED DIAGNOSES  Final diagnoses:  Precordial chest pain     MEDICATIONS GIVEN DURING THIS VISIT:  Medications  nitroGLYCERIN (NITROSTAT) SL tablet 0.4 mg (has no administration in time range)  ALPRAZolam (XANAX) tablet 0.5-1 mg (1 mg Oral Given 12/31/19 1757)  QUEtiapine (SEROQUEL) tablet 400 mg (400 mg Oral Given 12/31/19 2337)  calcium carbonate (TUMS - dosed in mg elemental calcium) chewable tablet 200-600 mg of elemental calcium (has no administration in time range)  enoxaparin (LOVENOX) injection 60 mg (60 mg Subcutaneous Given 12/31/19 1757)  sodium chloride flush (NS) 0.9 % injection 3 mL (3 mLs Intravenous Given 01/01/20 0900)  sodium chloride flush (NS) 0.9 % injection 3 mL (has no administration in time range)  0.9 %  sodium chloride infusion (has no administration in time range)  acetaminophen (TYLENOL) tablet 650 mg (650 mg Oral Given 12/31/19  2338)    Or  acetaminophen (TYLENOL) suppository 650 mg ( Rectal See Alternative 12/31/19 2338)  zolpidem (AMBIEN) tablet 5 mg (has no administration in time range)  ondansetron (ZOFRAN) tablet 4 mg (has no administration in time range)    Or  ondansetron (ZOFRAN) injection 4 mg (has no administration in time range)  polyethylene glycol (MIRALAX / GLYCOLAX) packet 17 g (has no administration in time range)  metoprolol tartrate (LOPRESSOR) injection 5 mg (has no administration in time range)  lamoTRIgine (LAMICTAL) tablet 400 mg (400 mg Oral Given 12/31/19 2338)  0.9 %  sodium chloride infusion ( Intravenous New Bag/Given 01/01/20 1030)  ALPRAZolam (XANAX) tablet 0.5 mg (0.5 mg Oral Given 01/01/20 1011)  nitroGLYCERIN (NITROSTAT) SL tablet 0.4 mg (0.4 mg Sublingual Given 12/31/19 1541)  aspirin chewable tablet 243 mg (243 mg Oral Given 12/31/19 1532)  ondansetron (ZOFRAN) injection 4 mg (4 mg Intravenous Given 12/31/19 1539)  furosemide (LASIX) injection 20  mg (20 mg Intravenous Given 01/01/20 0505)  aspirin chewable tablet 81 mg (81 mg Oral Given 01/01/20 1107)    Note:  This document was prepared using Dragon voice recognition software and may include unintentional dictation errors.  Nanda Quinton, MD, [facility] Emergency Medicine    Elara Cocke, [facility], MD 01/01/20 1136

## 2019-12-31 NOTE — ED Triage Notes (Signed)
Chest pain onset this am. 

## 2020-01-01 ENCOUNTER — Encounter (HOSPITAL_COMMUNITY): Payer: PPO

## 2020-01-01 ENCOUNTER — Encounter (HOSPITAL_COMMUNITY)
Admission: EM | Disposition: A | Discharge status: Home / Self Care | Payer: Self-pay | Source: Home / Self Care | Attending: Internal Medicine

## 2020-01-01 DIAGNOSIS — R0600 Dyspnea, unspecified: Secondary | ICD-10-CM

## 2020-01-01 DIAGNOSIS — I10 Essential (primary) hypertension: Secondary | ICD-10-CM

## 2020-01-01 DIAGNOSIS — I5033 Acute on chronic diastolic (congestive) heart failure: Secondary | ICD-10-CM

## 2020-01-01 DIAGNOSIS — R072 Precordial pain: Principal | ICD-10-CM

## 2020-01-01 HISTORY — PX: RIGHT/LEFT HEART CATH AND CORONARY ANGIOGRAPHY: CATH118266

## 2020-01-01 LAB — POCT I-STAT 7, (LYTES, BLD GAS, ICA,H+H)
Acid-Base Excess: 6 mmol/L — ABNORMAL HIGH (ref 0.0–2.0)
Bicarbonate: 31.3 mmol/L — ABNORMAL HIGH (ref 20.0–28.0)
Calcium, Ion: 1.23 mmol/L (ref 1.15–1.40)
HCT: 43 % (ref 36.0–46.0)
Hemoglobin: 14.6 g/dL (ref 12.0–15.0)
O2 Saturation: 97 %
Potassium: 3.8 mmol/L (ref 3.5–5.1)
Sodium: 141 mmol/L (ref 135–145)
TCO2: 33 mmol/L — ABNORMAL HIGH (ref 22–32)
pCO2 arterial: 45.8 mmHg (ref 32.0–48.0)
pH, Arterial: 7.443 (ref 7.350–7.450)
pO2, Arterial: 85 mmHg (ref 83.0–108.0)

## 2020-01-01 LAB — POCT I-STAT EG7
Acid-Base Excess: 7 mmol/L — ABNORMAL HIGH (ref 0.0–2.0)
Bicarbonate: 33.1 mmol/L — ABNORMAL HIGH (ref 20.0–28.0)
Calcium, Ion: 1.22 mmol/L (ref 1.15–1.40)
HCT: 42 % (ref 36.0–46.0)
Hemoglobin: 14.3 g/dL (ref 12.0–15.0)
O2 Saturation: 73 %
Potassium: 3.7 mmol/L (ref 3.5–5.1)
Sodium: 142 mmol/L (ref 135–145)
TCO2: 35 mmol/L — ABNORMAL HIGH (ref 22–32)
pCO2, Ven: 53.4 mmHg (ref 44.0–60.0)
pH, Ven: 7.401 (ref 7.250–7.430)
pO2, Ven: 39 mmHg (ref 32.0–45.0)

## 2020-01-01 LAB — POCT ACTIVATED CLOTTING TIME: Activated Clotting Time: 213 seconds

## 2020-01-01 SURGERY — RIGHT/LEFT HEART CATH AND CORONARY ANGIOGRAPHY
Anesthesia: LOCAL

## 2020-01-01 MED ORDER — SODIUM CHLORIDE 0.9 % IV SOLN: INTRAVENOUS | Status: AC

## 2020-01-01 MED ORDER — HEPARIN SODIUM (PORCINE) 1000 UNIT/ML IJ SOLN
INTRAMUSCULAR | Status: DC | PRN
  Administered 2020-01-01: 6000 [IU] via INTRAVENOUS

## 2020-01-01 MED ORDER — SODIUM CHLORIDE 0.9 % IV SOLN
INTRAVENOUS | Status: AC | PRN
  Administered 2020-01-01: 10 mL/h via INTRAVENOUS

## 2020-01-01 MED ORDER — SODIUM CHLORIDE 0.9 % IV SOLN: INTRAVENOUS | Status: DC

## 2020-01-01 MED ORDER — FENTANYL CITRATE (PF) 100 MCG/2ML IJ SOLN
INTRAMUSCULAR | Status: AC
  Filled 2020-01-01: qty 2

## 2020-01-01 MED ORDER — ALPRAZOLAM 0.5 MG PO TABS
0.5000 mg | ORAL_TABLET | Freq: Two times a day (BID) | ORAL | Status: DC
  Administered 2020-01-01 – 2020-01-02 (×3): 0.5 mg via ORAL
  Filled 2020-01-01 (×3): qty 1

## 2020-01-01 MED ORDER — SODIUM CHLORIDE 0.9 % IV SOLN: 250.0000 mL | INTRAVENOUS | Status: DC | PRN

## 2020-01-01 MED ORDER — HYDRALAZINE HCL 20 MG/ML IJ SOLN: 10.0000 mg | INTRAMUSCULAR | Status: AC | PRN

## 2020-01-01 MED ORDER — LABETALOL HCL 5 MG/ML IV SOLN: 10.0000 mg | INTRAVENOUS | Status: AC | PRN

## 2020-01-01 MED ORDER — MIDAZOLAM HCL 2 MG/2ML IJ SOLN
INTRAMUSCULAR | Status: AC
  Filled 2020-01-01: qty 2

## 2020-01-01 MED ORDER — IOHEXOL 350 MG/ML SOLN
INTRAVENOUS | Status: DC | PRN
  Administered 2020-01-01: 30 mL

## 2020-01-01 MED ORDER — ASPIRIN 81 MG PO CHEW
81.0000 mg | CHEWABLE_TABLET | ORAL | Status: DC
Start: 2020-01-02 — End: 2020-01-01

## 2020-01-01 MED ORDER — HEPARIN (PORCINE) IN NACL 1000-0.9 UT/500ML-% IV SOLN
INTRAVENOUS | Status: DC | PRN
  Administered 2020-01-01 (×2): 500 mL

## 2020-01-01 MED ORDER — SODIUM CHLORIDE 0.9% FLUSH
3.0000 mL | INTRAVENOUS | Status: DC | PRN
  Administered 2020-01-02: 3 mL via INTRAVENOUS

## 2020-01-01 MED ORDER — LIDOCAINE HCL (PF) 1 % IJ SOLN
INTRAMUSCULAR | Status: DC | PRN
  Administered 2020-01-01: 4 mL
  Administered 2020-01-01: 15 mL

## 2020-01-01 MED ORDER — ENOXAPARIN SODIUM 60 MG/0.6ML ~~LOC~~ SOLN: 60.0000 mg | Freq: Every day | SUBCUTANEOUS | Status: DC

## 2020-01-01 MED ORDER — SODIUM CHLORIDE 0.9% FLUSH
3.0000 mL | Freq: Two times a day (BID) | INTRAVENOUS | Status: DC
  Administered 2020-01-01 – 2020-01-02 (×2): 3 mL via INTRAVENOUS

## 2020-01-01 MED ORDER — ASPIRIN 81 MG PO CHEW
81.0000 mg | CHEWABLE_TABLET | ORAL | Status: AC
  Administered 2020-01-01: 81 mg via ORAL
  Filled 2020-01-01: qty 1

## 2020-01-01 MED ORDER — MIDAZOLAM HCL 2 MG/2ML IJ SOLN
INTRAMUSCULAR | Status: DC | PRN
  Administered 2020-01-01: 2 mg via INTRAVENOUS
  Administered 2020-01-01 (×2): 1 mg via INTRAVENOUS

## 2020-01-01 MED ORDER — FENTANYL CITRATE (PF) 100 MCG/2ML IJ SOLN
INTRAMUSCULAR | Status: DC | PRN
  Administered 2020-01-01 (×3): 25 ug via INTRAVENOUS

## 2020-01-01 MED ORDER — VERAPAMIL HCL 2.5 MG/ML IV SOLN
INTRAVENOUS | Status: AC
  Filled 2020-01-01: qty 2

## 2020-01-01 MED ORDER — HEPARIN SODIUM (PORCINE) 1000 UNIT/ML IJ SOLN
INTRAMUSCULAR | Status: AC
  Filled 2020-01-01: qty 1

## 2020-01-01 MED ORDER — HEPARIN (PORCINE) IN NACL 1000-0.9 UT/500ML-% IV SOLN
INTRAVENOUS | Status: AC
  Filled 2020-01-01: qty 1000

## 2020-01-01 MED ORDER — LIDOCAINE HCL (PF) 1 % IJ SOLN
INTRAMUSCULAR | Status: AC
  Filled 2020-01-01: qty 30

## 2020-01-01 MED ORDER — VERAPAMIL HCL 2.5 MG/ML IV SOLN
INTRAVENOUS | Status: DC | PRN
  Administered 2020-01-01: 10 mL via INTRA_ARTERIAL

## 2020-01-01 SURGICAL SUPPLY — 14 items
CATH 5FR JL3.5 JR4 ANG PIG MP (CATHETERS) ×1 IMPLANT
CATH SWAN GANZ 7F STRAIGHT (CATHETERS) ×1 IMPLANT
DEVICE RAD COMP TR BAND LRG (VASCULAR PRODUCTS) ×1 IMPLANT
GLIDESHEATH SLEND SS 6F .021 (SHEATH) ×1 IMPLANT
GLIDESHEATH SLENDER 7FR .021G (SHEATH) ×1 IMPLANT
GUIDEWIRE INQWIRE 1.5J.035X260 (WIRE) IMPLANT
INQWIRE 1.5J .035X260CM (WIRE) ×2
KIT HEART LEFT (KITS) ×2 IMPLANT
PACK CARDIAC CATHETERIZATION (CUSTOM PROCEDURE TRAY) ×2 IMPLANT
SHEATH PINNACLE 7F 10CM (SHEATH) ×1 IMPLANT
SHEATH PROBE COVER 6X72 (BAG) ×1 IMPLANT
TRANSDUCER W/STOPCOCK (MISCELLANEOUS) ×2 IMPLANT
TUBING CIL FLEX 10 FLL-RA (TUBING) ×2 IMPLANT
WIRE EMERALD 3MM-J .025X260CM (WIRE) ×2 IMPLANT

## 2020-01-01 NOTE — Progress Notes (Signed)
Ambulated in room to bathroom for bathroom use

## 2020-01-01 NOTE — H&P (View-Only) (Signed)
Progress Note  Patient Name: [name] Date of Encounter: 01/01/2020  [facility] Cardiologist: Kate Sable, MD    Subjective   Doesn't feel well-nonspecific, no chest pain or shortness of breath. BP low initially this am. Says she's not tolerating norvasc  Inpatient Medications    Scheduled Meds: . ALPRAZolam  1 mg Oral q morning - 10a  . amLODipine  5 mg Oral Daily  . enoxaparin (LOVENOX) injection  60 mg Subcutaneous Q24H  . lamoTRIgine  400 mg Oral QHS  . QUEtiapine  400 mg Oral QHS  . sodium chloride flush  3 mL Intravenous Q12H   Continuous Infusions: . sodium chloride     PRN Meds: sodium chloride, acetaminophen **OR** acetaminophen, ALPRAZolam, calcium carbonate, metoprolol tartrate, nitroGLYCERIN, ondansetron **OR** ondansetron (ZOFRAN) IV, polyethylene glycol, sodium chloride flush, zolpidem   Vital Signs    Vitals:   12/31/19 2230 12/31/19 2316 01/01/20 0315 01/01/20 0500  BP: (!) 146/69 136/67 102/60   Pulse: 72 63 63   Resp: (!) 21 16 16    Temp:  98 F (36.7 C) 98 F (36.7 C)   TempSrc:  Oral Oral   SpO2: 95% 97% 94%   Weight:    127 kg  Height:       No intake or output data in the 24 hours ending 01/01/20 0753 Last 3 Weights 01/01/2020 12/31/2019 12/12/2019  Weight (lbs) 279 lb 15.8 oz 282 lb 3 oz 283 lb  Weight (kg) 127 kg 128 kg 128.368 kg      Telemetry    Paced rhythm - Personally Reviewed  ECG       Physical Exam    GEN: No acute distress.   Neck: No JVD Cardiac: RRR, no murmurs, rubs, or gallops.  Respiratory: Clear to auscultation bilaterally. GI: Soft, nontender, non-distended  MS: No edema; No deformity. Neuro:  Nonfocal  Psych: Normal affect   Labs    High Sensitivity Troponin:   Recent Labs  Lab 12/05/19 0136 12/05/19 1802 12/05/19 1941 12/31/19 1353 12/31/19 1542  TROPONINIHS 6 5 5 4 4       Chemistry Recent Labs  Lab 12/31/19 1353 12/31/19 1542  NA 139  --   K 3.9  --   CL 99  --     CO2 28  --   GLUCOSE 114*  --   BUN 15  --   CREATININE 0.82  --   CALCIUM 9.6  --   PROT  --  7.3  ALBUMIN  --  4.2  AST  --  27  ALT  --  25  ALKPHOS  --  87  BILITOT  --  0.6  GFRNONAA >60  --   GFRAA >60  --   ANIONGAP 12  --      Hematology Recent Labs  Lab 12/31/19 1353  WBC 5.2  RBC 4.31  HGB 13.7  HCT 42.5  MCV 98.6  MCH 31.8  MCHC 32.2  RDW 13.1  PLT 255    BNPNo results for input(s): BNP, PROBNP in the last 168 hours.   DDimer No results for input(s): DDIMER in the last 168 hours.   Radiology    DG Chest 2 View  Result Date: 12/31/2019 CLINICAL DATA:  Chest pain. Additional history provided: Shortness of breath this morning. EXAM: CHEST - 2 VIEW COMPARISON:  Prior chest radiograph 12/27/2019 and earlier FINDINGS: Redemonstrated left chest dual lead pacer/AICD device. Heart size within normal limits. Mild central pulmonary vascular congestion appears  similar to prior examination. No evidence of airspace consolidation within the lungs. No frank pulmonary edema. No evidence of pleural effusion or pneumothorax. No acute bony abnormality identified. Thoracic spondylosis. Eventration of the right hemidiaphragm, chronic and unchanged. IMPRESSION: Mild central pulmonary vascular congestion, unchanged. No evidence of airspace consolidation or frank pulmonary edema. Electronically Signed   By: Kellie Simmering DO   On: 12/31/2019 14:59    Cardiac Studies   NST 12/14/19 No diagnostic ST segment changes indicate ischemia. Small, moderate intensity, reversible apical anterior defect that is consistent with ischemia, although noted in the setting of breast attenuation. This is a low risk study. Nuclear stress EF: 74%.   Patient Profile     69 y.o. female  with a hx of sinus dysfunction with pacemaker who is being seen today for the evaluation of SOB     Assessment & Plan    SOB/Chest pain with recent NST difficult to interpret-? Apical ant ischemia but some breast  attenuation.Troponins negative. Dr. Harl Bowie recommends RHC/LHC today-will arrange I have reviewed the risks, indications, and alternatives to angioplasty and stenting with the patient. Risks include but are not limited to bleeding, infection, vascular injury, stroke, myocardial infection, arrhythmia, kidney injury, radiation-related injury in the case of prolonged fluoroscopy use, emergency cardiac surgery, and death. The patient understands the risks of serious complication is low (<1%) and patient agrees to proceed.     Diastolic CHF I/O's not done-seems compensated on exam  Obesity  OSA  Sinus node dysfunction S/P pacemaker  Dizziness since increase in norvasc- orthostatic this am but BP 98 systolic this am came up to 121 with sitting dropped to 78 standing-stop norvasc  Nausea-lipase and liver enzymes normal       For questions or updates, please contact [facility] Please consult [email or web address] for contact info under        Signed, Ermalinda Barrios, PA-C  01/01/2020, 7:53 AM

## 2020-01-01 NOTE — Plan of Care (Signed)
  Problem: Safety: Goal: Ability to remain free from injury will improve Outcome: Progressing   Problem: Pain Managment: Goal: General experience of comfort will improve Outcome: Progressing   

## 2020-01-01 NOTE — Progress Notes (Signed)
Patient seen and evaluated this a.m. and complains of overall feeling of unwellness and some chest pressure.  She is also having some low blood pressures and orthostasis and therefore patient's Norvasc has been discontinued.  She is planned for transfer to [facility] for cardiac catheterization and will remain on the cardiology service upon transfer as discussed with cardiology.  Hospitalist team will sign off.  Total care time: 20 minutes.

## 2020-01-01 NOTE — Progress Notes (Addendum)
Progress Note  Patient Name: [name] Date of Encounter: 01/01/2020  [facility] Cardiologist: Kate Sable, MD    Subjective   Doesn't feel well-nonspecific, no chest pain or shortness of breath. BP low initially this am. Says she's not tolerating norvasc  Inpatient Medications    Scheduled Meds: . ALPRAZolam  1 mg Oral q morning - 10a  . amLODipine  5 mg Oral Daily  . enoxaparin (LOVENOX) injection  60 mg Subcutaneous Q24H  . lamoTRIgine  400 mg Oral QHS  . QUEtiapine  400 mg Oral QHS  . sodium chloride flush  3 mL Intravenous Q12H   Continuous Infusions: . sodium chloride     PRN Meds: sodium chloride, acetaminophen **OR** acetaminophen, ALPRAZolam, calcium carbonate, metoprolol tartrate, nitroGLYCERIN, ondansetron **OR** ondansetron (ZOFRAN) IV, polyethylene glycol, sodium chloride flush, zolpidem   Vital Signs    Vitals:   12/31/19 2230 12/31/19 2316 01/01/20 0315 01/01/20 0500  BP: (!) 146/69 136/67 102/60   Pulse: 72 63 63   Resp: (!) 21 16 16    Temp:  98 F (36.7 C) 98 F (36.7 C)   TempSrc:  Oral Oral   SpO2: 95% 97% 94%   Weight:    127 kg  Height:       No intake or output data in the 24 hours ending 01/01/20 0753 Last 3 Weights 01/01/2020 12/31/2019 12/12/2019  Weight (lbs) 279 lb 15.8 oz 282 lb 3 oz 283 lb  Weight (kg) 127 kg 128 kg 128.368 kg      Telemetry    Paced rhythm - Personally Reviewed  ECG       Physical Exam    GEN: No acute distress.   Neck: No JVD Cardiac: RRR, no murmurs, rubs, or gallops.  Respiratory: Clear to auscultation bilaterally. GI: Soft, nontender, non-distended  MS: No edema; No deformity. Neuro:  Nonfocal  Psych: Normal affect   Labs    High Sensitivity Troponin:   Recent Labs  Lab 12/05/19 0136 12/05/19 1802 12/05/19 1941 12/31/19 1353 12/31/19 1542  TROPONINIHS 6 5 5 4 4       Chemistry Recent Labs  Lab 12/31/19 1353 12/31/19 1542  NA 139  --   K 3.9  --   CL 99  --     CO2 28  --   GLUCOSE 114*  --   BUN 15  --   CREATININE 0.82  --   CALCIUM 9.6  --   PROT  --  7.3  ALBUMIN  --  4.2  AST  --  27  ALT  --  25  ALKPHOS  --  87  BILITOT  --  0.6  GFRNONAA >60  --   GFRAA >60  --   ANIONGAP 12  --      Hematology Recent Labs  Lab 12/31/19 1353  WBC 5.2  RBC 4.31  HGB 13.7  HCT 42.5  MCV 98.6  MCH 31.8  MCHC 32.2  RDW 13.1  PLT 255    BNPNo results for input(s): BNP, PROBNP in the last 168 hours.   DDimer No results for input(s): DDIMER in the last 168 hours.   Radiology    DG Chest 2 View  Result Date: 12/31/2019 CLINICAL DATA:  Chest pain. Additional history provided: Shortness of breath this morning. EXAM: CHEST - 2 VIEW COMPARISON:  Prior chest radiograph 12/27/2019 and earlier FINDINGS: Redemonstrated left chest dual lead pacer/AICD device. Heart size within normal limits. Mild central pulmonary vascular congestion appears  similar to prior examination. No evidence of airspace consolidation within the lungs. No frank pulmonary edema. No evidence of pleural effusion or pneumothorax. No acute bony abnormality identified. Thoracic spondylosis. Eventration of the right hemidiaphragm, chronic and unchanged. IMPRESSION: Mild central pulmonary vascular congestion, unchanged. No evidence of airspace consolidation or frank pulmonary edema. Electronically Signed   By: Kellie Simmering DO   On: 12/31/2019 14:59    Cardiac Studies   NST 12/14/19 No diagnostic ST segment changes indicate ischemia. Small, moderate intensity, reversible apical anterior defect that is consistent with ischemia, although noted in the setting of breast attenuation. This is a low risk study. Nuclear stress EF: 74%.   Patient Profile     69 y.o. female  with a hx of sinus dysfunction with pacemaker who is being seen today for the evaluation of SOB     Assessment & Plan    SOB/Chest pain with recent NST difficult to interpret-? Apical ant ischemia but some breast  attenuation.Troponins negative. Dr. Harl Bowie recommends RHC/LHC today-will arrange I have reviewed the risks, indications, and alternatives to angioplasty and stenting with the patient. Risks include but are not limited to bleeding, infection, vascular injury, stroke, myocardial infection, arrhythmia, kidney injury, radiation-related injury in the case of prolonged fluoroscopy use, emergency cardiac surgery, and death. The patient understands the risks of serious complication is low (<8%) and patient agrees to proceed.     Diastolic CHF I/O's not done-seems compensated on exam  Obesity  OSA  Sinus node dysfunction S/P pacemaker  Dizziness since increase in norvasc- orthostatic this am but BP 98 systolic this am came up to 121 with sitting dropped to 78 standing-stop norvasc  Nausea-lipase and liver enzymes normal       For questions or updates, please contact [facility] Please consult [email or web address] for contact info under        Signed, Ermalinda Barrios, PA-C  01/01/2020, 7:53 AM

## 2020-01-01 NOTE — Progress Notes (Signed)
7 french sheath removed from RFV @ 17:33 - manual pressure held for 15 minutes. No bleeding / hematoma - dressing applied/ clean / intact.

## 2020-01-01 NOTE — Interval H&P Note (Signed)
History and Physical Interval Note:  01/01/2020 3:41 PM  [name]  has presented today for surgery, with the diagnosis of chest pain.  The various methods of treatment have been discussed with the patient and family. After consideration of risks, benefits and other options for treatment, the patient has consented to  Procedure(s): RIGHT/LEFT HEART CATH AND CORONARY ANGIOGRAPHY (N/A) as a surgical intervention.  The patient's history has been reviewed, patient examined, no change in status, stable for surgery.  I have reviewed the patient's chart and labs.  Questions were answered to the patient's satisfaction.    Cath Lab Visit (complete for each Cath Lab visit)  Clinical Evaluation Leading to the Procedure:   ACS: No.  Non-ACS:    Anginal Classification: CCS III  Anti-ischemic medical therapy: No Therapy  Non-Invasive Test Results: Low-risk stress test findings: cardiac mortality <1%/year  Prior CABG: No previous CABG        Lauree Chandler

## 2020-01-02 ENCOUNTER — Encounter (HOSPITAL_COMMUNITY): Payer: Self-pay | Admitting: Cardiovascular Disease

## 2020-01-02 ENCOUNTER — Ambulatory Visit: Payer: PPO | Admitting: Cardiology

## 2020-01-02 DIAGNOSIS — I5032 Chronic diastolic (congestive) heart failure: Secondary | ICD-10-CM

## 2020-01-02 LAB — BASIC METABOLIC PANEL
Anion gap: 11 (ref 5–15)
BUN: 16 mg/dL (ref 8–23)
CO2: 27 mmol/L (ref 22–32)
Calcium: 8.9 mg/dL (ref 8.9–10.3)
Chloride: 100 mmol/L (ref 98–111)
Creatinine, Ser: 0.91 mg/dL (ref 0.44–1.00)
GFR calc Af Amer: >60 mL/min (ref >60)
GFR calc non Af Amer: >60 mL/min (ref >60)
Glucose, Bld: 116 mg/dL — ABNORMAL HIGH (ref 70–99)
Potassium: 4 mmol/L (ref 3.5–5.1)
Sodium: 138 mmol/L (ref 135–145)

## 2020-01-02 LAB — CBC
HCT: 41.5 % (ref 36.0–46.0)
Hemoglobin: 13.4 g/dL (ref 12.0–15.0)
MCH: 31 pg (ref 26.0–34.0)
MCHC: 32.3 g/dL (ref 30.0–36.0)
MCV: 96.1 fL (ref 80.0–100.0)
Platelets: 248 10*3/uL (ref 150–400)
RBC: 4.32 MIL/uL (ref 3.87–5.11)
RDW: 13 % (ref 11.5–15.5)
WBC: 6.2 10*3/uL (ref 4.0–10.5)
nRBC: 0 % (ref 0.0–0.2)

## 2020-01-02 LAB — POCT ACTIVATED CLOTTING TIME: Activated Clotting Time: 158 seconds

## 2020-01-02 MED ORDER — FUROSEMIDE 20 MG PO TABS
40.0000 mg | ORAL_TABLET | Freq: Every day | ORAL | 2 refills | Status: DC
Start: 2020-01-02 — End: 2020-02-25

## 2020-01-02 NOTE — Progress Notes (Signed)
Progress Note  Patient Name: [name] Date of Encounter: 01/02/2020  [facility] Cardiologist: Kate Sable, MD   Subjective   Cath showed normal coronary arteries and normal filling pressures. No shortness of breath at rest but still feels winded with activity. Better than before. No chest pain. She still feels very tired. No palpitations. Dizziness and nausea have resolved.  Inpatient Medications    Scheduled Meds: . ALPRAZolam  0.5 mg Oral BID  . enoxaparin (LOVENOX) injection  60 mg Subcutaneous Daily  . lamoTRIgine  400 mg Oral QHS  . QUEtiapine  400 mg Oral QHS  . sodium chloride flush  3 mL Intravenous Q12H  . sodium chloride flush  3 mL Intravenous Q12H   Continuous Infusions: . sodium chloride    . sodium chloride     PRN Meds: sodium chloride, sodium chloride, acetaminophen **OR** acetaminophen, ALPRAZolam, calcium carbonate, metoprolol tartrate, nitroGLYCERIN, ondansetron **OR** ondansetron (ZOFRAN) IV, polyethylene glycol, sodium chloride flush, sodium chloride flush, zolpidem   Vital Signs    Vitals:   01/01/20 1815 01/01/20 2025 01/01/20 2200 01/02/20 0414  BP: 107/67 136/68 113/66 (!) 114/56  Pulse: 67 65 62 65  Resp: 20 20  18   Temp: 98.3 F (36.8 C) 98.6 F (37 C)  (!) 97.5 F (36.4 C)  TempSrc: Oral Oral  Oral  SpO2: 97% 94% 94% 90%  Weight:    126.2 kg  Height:        Intake/Output Summary (Last 24 hours) at 01/02/2020 0757 Last data filed at 01/01/2020 2200 Gross per 24 hour  Intake 82.33 ml  Output --  Net 82.33 ml   Last 3 Weights 01/02/2020 01/01/2020 12/31/2019  Weight (lbs) 278 lb 4.8 oz 279 lb 15.8 oz 282 lb 3 oz  Weight (kg) 126.236 kg 127 kg 128 kg      Telemetry    Atrial paced. - Personally Reviewed  ECG    No new ECG tracing today. - Personally Reviewed  Physical Exam   GEN: Obese Caucasian female in no acute distress.   Neck: Supple. No obvious JVD but difficult to assess due to body  habitus. Cardiac: RRR. No murmurs, rubs, or gallops. Right femoral cath site soft with no signs of hematoma.  Respiratory: Clear to auscultation bilaterally. GI: Soft, non-distended, and non-tender. MS: No lower extremity edema. No deformity. Skin: Warm and dry. Neuro:  No focal deficits. Psych: Normal affect. Responds appropriately.  Labs    High Sensitivity Troponin:   Recent Labs  Lab 12/05/19 0136 12/05/19 1802 12/05/19 1941 12/31/19 1353 12/31/19 1542  TROPONINIHS 6 5 5 4 4       Chemistry Recent Labs  Lab 12/31/19 1353 12/31/19 1542 01/01/20 1635 01/02/20 0330  NA 139  --  141  142 138  K 3.9  --  3.8  3.7 4.0  CL 99  --   --  100  CO2 28  --   --  27  GLUCOSE 114*  --   --  116*  BUN 15  --   --  16  CREATININE 0.82  --   --  0.91  CALCIUM 9.6  --   --  8.9  PROT  --  7.3  --   --   ALBUMIN  --  4.2  --   --   AST  --  27  --   --   ALT  --  25  --   --   ALKPHOS  --  87  --   --   BILITOT  --  0.6  --   --   GFRNONAA >60  --   --  >60  GFRAA >60  --   --  >60  ANIONGAP 12  --   --  11     Hematology Recent Labs  Lab 12/31/19 1353 01/01/20 1635 01/02/20 0330  WBC 5.2  --  6.2  RBC 4.31  --  4.32  HGB 13.7 14.6  14.3 13.4  HCT 42.5 43.0  42.0 41.5  MCV 98.6  --  96.1  MCH 31.8  --  31.0  MCHC 32.2  --  32.3  RDW 13.1  --  13.0  PLT 255  --  248    BNPNo results for input(s): BNP, PROBNP in the last 168 hours.   DDimer No results for input(s): DDIMER in the last 168 hours.   Radiology    DG Chest 2 View  Result Date: 12/31/2019 CLINICAL DATA:  Chest pain. Additional history provided: Shortness of breath this morning. EXAM: CHEST - 2 VIEW COMPARISON:  Prior chest radiograph 12/27/2019 and earlier FINDINGS: Redemonstrated left chest dual lead pacer/AICD device. Heart size within normal limits. Mild central pulmonary vascular congestion appears similar to prior examination. No evidence of airspace consolidation within the lungs. No frank  pulmonary edema. No evidence of pleural effusion or pneumothorax. No acute bony abnormality identified. Thoracic spondylosis. Eventration of the right hemidiaphragm, chronic and unchanged. IMPRESSION: Mild central pulmonary vascular congestion, unchanged. No evidence of airspace consolidation or frank pulmonary edema. Electronically Signed   By: Kellie Simmering DO   On: 12/31/2019 14:59   CARDIAC CATHETERIZATION  Result Date: 01/01/2020 Normal coronary arteries Normal filling pressures No further ischemic workup   Cardiac Studies   Right/Left Cardiac Catheterization 01/01/2020: Normal coronary arteries. Normal filling pressures.  No further ischemic workup.  Diagnostic Dominance: Right    Patient Profile     69 y.o. female with a history of sinus dysfunction s/p PPM followed by Dr. Lovena Le, chronic diastolic CHF, sleep apnea, hypertension, hyperlipidemia, anxiety/depression/bipolar disorder who has had multiple ED visits recently for shortness of breath. She had an outpatient nuclear stress test on 12/14/2019 which was difficult to interpret - had a small reversible apical anterior defect consistent with ischemia but in the setting of breast attenuation. She was admitted to [facility] on 12/31/2019 for further evaluation of shortness of breath and chest pain. Decision was made to transfer her to [name] for right/left heart catheterization.  Assessment & Plan    Dyspnea/Chest Pain Chronic Diastolic CHF - High-sensitivity troponin negative x2. - Chest x-ray showed mild central pulmonary vascular congestion but no airspace consolidation or frank pulmonary edema.  - BNP was not checked.  - Right/left heart cath yesterday showed normal coronaries and normal filling pressures.  - Echo in 11/2019 showed LVEF of 65-70% with only mild LVH and grade 1 diastolic dysfunction. - D-dimer in 11/2019 was negative and patient does not have any signs of PE (no tachypnea, tachycardia, or hypoxia; no recent  travel or surgeries). - Patient only on Lasix 20mg  daily at home. Can increase to 40mg  daily.  - Deconditioning and body habitus may also be contributing to patient's dyspnea. Encouraged increasing physical activity.  Othostatic Hypotension - Patient reported dizziness at [facility] and orthostatics were positive.  - Amlodipine was stopped.  - Dizziness has resolved.  - Will recheck orthostatic prior to discharge.   Fatigue - One of patient's biggest  complaints to today is just continue fatigue. - No signs of infection. - Will check TSH.   Sinus Node Dysfunction S/p PPM - Device functioning appropriately. - Followed by Dr. Lovena Le.   Disposition: Suspect patient can be discharged today. MD to follow with any additional recommendations.  For questions or updates, please contact Seth Ward Please consult [email or web address] for contact info under        Signed, Darreld Mclean, PA-C  01/02/2020, 7:57 AM

## 2020-01-02 NOTE — Discharge Instructions (Signed)
Heart Failure Education: 1. Weigh yourself EVERY morning after you go to the bathroom but before you eat or drink anything. Write this number down in a weight log/diary. If you gain 3 pounds overnight or 5 pounds in a week, call the office. 2. Take your medicines as prescribed. If you have concerns about your medications, please call us before you stop taking them.  3. Eat low salt foods--Limit salt (sodium) to 2000 mg per day. This will help prevent your body from holding onto fluid. Read food labels as many processed foods have a lot of sodium, especially canned goods and prepackaged meats. If you would like some assistance choosing low sodium foods, we would be happy to set you up with a nutritionist. 4. Stay as active as you can everyday. Staying active will give you more energy and make your muscles stronger. Start with 5 minutes at a time and work your way up to 30 minutes a day. Break up your activities--do some in the morning and some in the afternoon. Start with 3 days per week and work your way up to 5 days as you can.  If you have chest pain, feel short of breath, dizzy, or lightheaded, STOP. If you don't feel better after a short rest, call 911. If you do feel better, call the office to let us know you have symptoms with exercise. 5. Limit all fluids for the day to less than 2 liters. Fluid includes all drinks, coffee, juice, ice chips, soup, jello, and all other liquids.  Post Cardiac Catheterization: NO HEAVY LIFTING OR SEXUAL ACTIVITY X 7 DAYS. NO DRIVING X 2-3 DAYS. NO SOAKING BATHS, HOT TUBS, POOLS, ETC., X 7 DAYS.  Groin Site Care: Refer to this sheet in the next few weeks. These instructions provide you with information on caring for yourself after your procedure. Your caregiver may also give you more specific instructions. Your treatment has been planned according to current medical practices, but problems sometimes occur. Call your caregiver if you have any problems or questions  after your procedure. HOME CARE INSTRUCTIONS  You may shower 24 hours after the procedure. Remove the bandage (dressing) and gently wash the site with plain soap and water. Gently pat the site dry.   Do not apply powder or lotion to the site.   Do not sit in a bathtub, swimming pool, or whirlpool for 5 to 7 days.   No bending, squatting, or lifting anything over 10 pounds (4.5 kg) as directed by your caregiver.   Inspect the site at least twice daily.   Do not drive home if you are discharged the same day of the procedure. Have someone else drive you.  What to expect:  Any bruising will usually fade within 1 to 2 weeks.   Blood that collects in the tissue (hematoma) may be painful to the touch. It should usually decrease in size and tenderness within 1 to 2 weeks.  SEEK IMMEDIATE MEDICAL CARE IF:  You have unusual pain at the groin site or down the affected leg.   You have redness, warmth, swelling, or pain at the groin site.   You have drainage (other than a small amount of blood on the dressing).   You have chills.   You have a fever or persistent symptoms for more than 72 hours.   You have a fever and your symptoms suddenly get worse.   Your leg becomes pale, cool, tingly, or numb.   You have heavy bleeding from the  site. Hold pressure on the site. .   Radial Site Care  This sheet gives you information about how to care for yourself after your procedure. Your health care provider may also give you more specific instructions. If you have problems or questions, contact your health care provider. What can I expect after the procedure? After the procedure, it is common to have:  Bruising and tenderness at the catheter insertion area. Follow these instructions at home: Medicines  Take over-the-counter and prescription medicines only as told by your health care provider. Insertion site care  Follow instructions from your health care provider about how to take care of  your insertion site. Make sure you: ? Wash your hands with soap and water before you change your bandage (dressing). If soap and water are not available, use hand sanitizer. ? Change your dressing as told by your health care provider. ? Leave stitches (sutures), skin glue, or adhesive strips in place. These skin closures may need to stay in place for 2 weeks or longer. If adhesive strip edges start to loosen and curl up, you may trim the loose edges. Do not remove adhesive strips completely unless your health care provider tells you to do that.  Check your insertion site every day for signs of infection. Check for: ? Redness, swelling, or pain. ? Fluid or blood. ? Pus or a bad smell. ? Warmth.  Do not take baths, swim, or use a hot tub until your health care provider approves.  You may shower 24-48 hours after the procedure, or as directed by your health care provider. ? Remove the dressing and gently wash the site with plain soap and water. ? Pat the area dry with a clean towel. ? Do not rub the site. That could cause bleeding.  Do not apply powder or lotion to the site. Activity   For 24 hours after the procedure, or as directed by your health care provider: ? Do not flex or bend the affected arm. ? Do not push or pull heavy objects with the affected arm. ? Do not drive yourself home from the hospital or clinic. You may drive 24 hours after the procedure unless your health care provider tells you not to. ? Do not operate machinery or power tools.  Do not lift anything that is heavier than 10 lb (4.5 kg), or the limit that you are told, until your health care provider says that it is safe.  Ask your health care provider when it is okay to: ? Return to work or school. ? Resume usual physical activities or sports. ? Resume sexual activity. General instructions  If the catheter site starts to bleed, raise your arm and put firm pressure on the site. If the bleeding does not stop, get  help right away. This is a medical emergency.  If you went home on the same day as your procedure, a responsible adult should be with you for the first 24 hours after you arrive home.  Keep all follow-up visits as told by your health care provider. This is important. Contact a health care provider if:  You have a fever.  You have redness, swelling, or yellow drainage around your insertion site. Get help right away if:  You have unusual pain at the radial site.  The catheter insertion area swells very fast.  The insertion area is bleeding, and the bleeding does not stop when you hold steady pressure on the area.  Your arm or hand becomes pale, cool,  tingly, or numb. These symptoms may represent a serious problem that is an emergency. Do not wait to see if the symptoms will go away. Get medical help right away. Call your local emergency services (911 in the [place] Do not drive yourself to the hospital. Summary  After the procedure, it is common to have bruising and tenderness at the site.  Follow instructions from your health care provider about how to take care of your radial site wound. Check the wound every day for signs of infection.  Do not lift anything that is heavier than 10 lb (4.5 kg), or the limit that you are told, until your health care provider says that it is safe. This information is not intended to replace advice given to you by your health care provider. Make sure you discuss any questions you have with your health care provider. Document Revised: 08/03/2017 Document Reviewed: 08/03/2017 Elsevier Patient Education  2020 [place].   Femoral Site Care This sheet gives you information about how to care for yourself after your procedure. Your health care provider may also give you more specific instructions. If you have problems or questions, contact your health care provider. What can I expect after the procedure? After the procedure, it is common to  have:  Bruising that usually fades within 1-2 weeks.  Tenderness at the site. Follow these instructions at home: Wound care  Follow instructions from your health care provider about how to take care of your insertion site. Make sure you: ? Wash your hands with soap and water before you change your bandage (dressing). If soap and water are not available, use hand sanitizer. ? Change your dressing as told by your health care provider. ? Leave stitches (sutures), skin glue, or adhesive strips in place. These skin closures may need to stay in place for 2 weeks or longer. If adhesive strip edges start to loosen and curl up, you may trim the loose edges. Do not remove adhesive strips completely unless your health care provider tells you to do that.  Do not take baths, swim, or use a hot tub until your health care provider approves.  You may shower 24-48 hours after the procedure or as told by your health care provider. ? Gently wash the site with plain soap and water. ? Pat the area dry with a clean towel. ? Do not rub the site. This may cause bleeding.  Do not apply powder or lotion to the site. Keep the site clean and dry.  Check your femoral site every day for signs of infection. Check for: ? Redness, swelling, or pain. ? Fluid or blood. ? Warmth. ? Pus or a bad smell. Activity  For the first 2-3 days after your procedure, or as long as directed: ? Avoid climbing stairs as much as possible. ? Do not squat.  Do not lift anything that is heavier than 10 lb (4.5 kg), or the limit that you are told, until your health care provider says that it is safe.  Rest as directed. ? Avoid sitting for a long time without moving. Get up to take short walks every 1-2 hours.  Do not drive for 24 hours if you were given a medicine to help you relax (sedative). General instructions  Take over-the-counter and prescription medicines only as told by your health care provider.  Keep all follow-up  visits as told by your health care provider. This is important. Contact a health care provider if you have:  A fever or  chills.  You have redness, swelling, or pain around your insertion site. Get help right away if:  The catheter insertion area swells very fast.  You pass out.  You suddenly start to sweat or your skin gets clammy.  The catheter insertion area is bleeding, and the bleeding does not stop when you hold steady pressure on the area.  The area near or just beyond the catheter insertion site becomes pale, cool, tingly, or numb. These symptoms may represent a serious problem that is an emergency. Do not wait to see if the symptoms will go away. Get medical help right away. Call your local emergency services (911 in the [place] Do not drive yourself to the hospital. Summary  After the procedure, it is common to have bruising that usually fades within 1-2 weeks.  Check your femoral site every day for signs of infection.  Do not lift anything that is heavier than 10 lb (4.5 kg), or the limit that you are told, until your health care provider says that it is safe. This information is not intended to replace advice given to you by your health care provider. Make sure you discuss any questions you have with your health care provider. Document Revised: 07/11/2017 Document Reviewed: 07/11/2017 Elsevier Patient Education  [place] Heart-healthy meal planning includes:  Eating less unhealthy fats.  Eating more healthy fats.  Making other changes in your diet. Talk with your doctor or a diet specialist (dietitian) to create an eating plan that is right for you. What is my plan? Your doctor may recommend an eating plan that includes:  Total fat: ______% or less of total calories a day.  Saturated fat: ______% or less of total calories a day.  Cholesterol: less than _________mg a day. What are tips for following this  plan? Cooking Avoid frying your food. Try to bake, boil, grill, or broil it instead. You can also reduce fat by:  Removing the skin from poultry.  Removing all visible fats from meats.  Steaming vegetables in water or broth. Meal planning   At meals, divide your plate into four equal parts: ? Fill one-half of your plate with vegetables and green salads. ? Fill one-fourth of your plate with whole grains. ? Fill one-fourth of your plate with lean protein foods.  Eat 4-5 servings of vegetables per day. A serving of vegetables is: ? 1 cup of raw or cooked vegetables. ? 2 cups of raw leafy greens.  Eat 4-5 servings of fruit per day. A serving of fruit is: ? 1 medium whole fruit. ?  cup of dried fruit. ?  cup of fresh, frozen, or canned fruit. ?  cup of 100% fruit juice.  Eat more foods that have soluble fiber. These are apples, broccoli, carrots, beans, peas, and barley. Try to get 20-30 g of fiber per day.  Eat 4-5 servings of nuts, legumes, and seeds per week: ? 1 serving of dried beans or legumes equals  cup after being cooked. ? 1 serving of nuts is  cup. ? 1 serving of seeds equals 1 tablespoon. General information  Eat more home-cooked food. Eat less restaurant, buffet, and fast food.  Limit or avoid alcohol.  Limit foods that are high in starch and sugar.  Avoid fried foods.  Lose weight if you are overweight.  Keep track of how much salt (sodium) you eat. This is important if you have high blood pressure. Ask your doctor to tell you more  about this.  Try to add vegetarian meals each week. Fats  Choose healthy fats. These include olive oil and canola oil, flaxseeds, walnuts, almonds, and seeds.  Eat more omega-3 fats. These include salmon, mackerel, sardines, tuna, flaxseed oil, and ground flaxseeds. Try to eat fish at least 2 times each week.  Check food labels. Avoid foods with trans fats or high amounts of saturated fat.  Limit saturated  fats. ? These are often found in animal products, such as meats, butter, and cream. ? These are also found in plant foods, such as palm oil, palm kernel oil, and coconut oil.  Avoid foods with partially hydrogenated oils in them. These have trans fats. Examples are stick margarine, some tub margarines, cookies, crackers, and other baked goods. What foods can I eat? Fruits All fresh, canned (in natural juice), or frozen fruits. Vegetables Fresh or frozen vegetables (raw, steamed, roasted, or grilled). Green salads. Grains Most grains. Choose whole wheat and whole grains most of the time. Rice and pasta, including brown rice and pastas made with whole wheat. Meats and other proteins Lean, well-trimmed beef, veal, pork, and lamb. Chicken and [place] without skin. All fish and shellfish. Wild duck, rabbit, pheasant, and venison. Egg whites or low-cholesterol egg substitutes. Dried beans, peas, lentils, and tofu. Seeds and most nuts. Dairy Low-fat or nonfat cheeses, including ricotta and mozzarella. Skim or 1% milk that is liquid, powdered, or evaporated. Buttermilk that is made with low-fat milk. Nonfat or low-fat yogurt. Fats and oils Non-hydrogenated (trans-free) margarines. Vegetable oils, including soybean, sesame, sunflower, olive, peanut, safflower, corn, canola, and cottonseed. Salad dressings or mayonnaise made with a vegetable oil. Beverages Mineral water. Coffee and tea. Diet carbonated beverages. Sweets and desserts Sherbet, gelatin, and fruit ice. Small amounts of dark chocolate. Limit all sweets and desserts. Seasonings and condiments All seasonings and condiments. The items listed above may not be a complete list of foods and drinks you can eat. Contact a dietitian for more options. What foods should I avoid? Fruits Canned fruit in heavy syrup. Fruit in cream or butter sauce. Fried fruit. Limit coconut. Vegetables Vegetables cooked in cheese, cream, or butter sauce. Fried  vegetables. Grains Breads that are made with saturated or trans fats, oils, or whole milk. Croissants. Sweet rolls. Donuts. High-fat crackers, such as cheese crackers. Meats and other proteins Fatty meats, such as hot dogs, ribs, sausage, bacon, rib-eye roast or steak. High-fat deli meats, such as salami and bologna. Caviar. Domestic duck and goose. Organ meats, such as liver. Dairy Cream, sour cream, cream cheese, and creamed cottage cheese. Whole-milk cheeses. Whole or 2% milk that is liquid, evaporated, or condensed. Whole buttermilk. Cream sauce or high-fat cheese sauce. Yogurt that is made from whole milk. Fats and oils Meat fat, or shortening. Cocoa butter, hydrogenated oils, palm oil, coconut oil, palm kernel oil. Solid fats and shortenings, including bacon fat, salt pork, lard, and butter. Nondairy cream substitutes. Salad dressings with cheese or sour cream. Beverages Regular sodas and juice drinks with added sugar. Sweets and desserts Frosting. Pudding. Cookies. Cakes. Pies. Milk chocolate or white chocolate. Buttered syrups. Full-fat ice cream or ice cream drinks. The items listed above may not be a complete list of foods and drinks to avoid. Contact a dietitian for more information. Summary  Heart-healthy meal planning includes eating less unhealthy fats, eating more healthy fats, and making other changes in your diet.  Eat a balanced diet. This includes fruits and vegetables, low-fat or nonfat dairy, lean protein, nuts  and legumes, whole grains, and heart-healthy oils and fats. This information is not intended to replace advice given to you by your health care provider. Make sure you discuss any questions you have with your health care provider. Document Revised: 09/01/2017 Document Reviewed: 08/05/2017 Elsevier Patient Education  2020 [employer].

## 2020-01-02 NOTE — Discharge Summary (Signed)
Discharge Summary    Patient ID: [name] MRN: [id number]; DOB: 1950/11/23  Admit date: 12/31/2019 Discharge date: 01/02/2020  Primary Care Provider: Kathyrn Drown, MD  Primary Cardiologist: Rozann Lesches, MD  Primary Electrophysiologist:  Cristopher Peru, MD   Discharge Diagnoses    Principal Problem:   Dyspnea Active Problems:   Chronic diastolic CHF (congestive heart failure) (HCC)   Precordial chest pain   Orthostatic hypotension - resolved   History of left breast cancer (2013)   Bipolar disorder [facility])   Anxiety    Diagnostic Studies/Procedures   Right/Left Cardiac Catheterization 01/01/2020: Normal coronary arteries. Normal filling pressures.  No further ischemic workup.  Diagnostic Dominance: Right     History of Present Illness     [name] is a 69 y.o. female with a history of sinus dysfunction s/p PPM followed by Dr. Lovena Le, chronic diastolic CHF, sleep apnea, hypertension, hyperlipidemia, anxiety/depression/bipolar disorder. She has had severe ED visits/hospitalizations over the last couple months for shortness of breath. Echo on 12/05/2019 showed LVEF of 65-70% with normal wall motion, mild LVH, and grade I diastolic dysfunction. She had an outpatient Myoview 12/14/2019 which was difficult to interpret. There was a small reversible apical anterior defect consistent with ischemia but in the setting of breast attenuation.   Patient presented to [facility] ED on 12/31/2019 for chest pain, shortness of breath, and nausea. She reported 1-2 months of progression of shortness of breath. She reported dyspnea on exertion with walking short distance such as from the parking lot into the grocery store. She also reported mil exertion chest tightness that she ranked as a 2/10. Resolves with rest. She described recent orthopnea but no lower extremity edema.  In the ED, EKG showed no acute ST/T changes. High-sensitivity negative x2. Chest x-ray showed  mild central pulmonary vascular congestion unchanged from prior imaging but no evidence of airspace consolidation or frank edema. WBC 5.2, Hgb 13.7, Plts 255. Na 138, K 4.0, Glucose 116, BUN 16, Cr 0.91. LFTs normal. Lipase normal. She was admitted and received 2 doses of IV Lasix 20mg . Cardiology was consulted and decision was made to transfer patient to [facility] for right/left cardiac catheterization.  Hospital Course     Consultants: None.  Chronic Diastolic CHF with Dyspnea and Chest Pain Patient transferred to [facility] for right/left heart catheterization for further evaluation of persistent dyspnea and chest tightness. Cath showed normal coronaries and normal filling pressures. Patient tolerated the procedure well. Right groin site soft with no signs of hematoma. Renal function stable. Doing well this morning. She still has some shortness of breath with activity but not a rest. Suspect dyspnea is multifactorial due to diastolic dysfunction, deconditioning, and obesity (BMI 47). She had a negative D-dimer last mont and is not tachypneic, tachycardic, or hypoxic so do not think patient has PE. Patient on Lasix 20mg  daily at home. Will increase to 40mg  daily at home. Discharge weight 278.3 lbs. Discussed importance of daily weights and limiting sodium intake. Repeat BMET at follow-up visit given increase in diuretic.  Othostatic Hypotension Patient has history of hypertension but reported dizziness at [facility] and orthostatics were positive. Amlodipine was stopped. Dizziness as resolved. Repeat orthostatics on day of discharge were negative. BP well controlled.  Fatigue One of patient's biggest complaints to today is continued fatigue. She has no signs of infection. No leukocytosis or fever. She states thyroid has been checked in the past and was normal but cannot remember when this was last  checked. Consider rechecking this as outpatient.  Sinus Node Dysfunction S/p PPM Device functioning  appropriately. Followed by Dr. Lovena Le.  Anxiety/Depression/Bipolar Disorder Continue home medications.   Patient seen and examined today by Dr. Debara Pickett and determined to be stable for discharge. Outpatient follow-up has been arranged. Medications as below.  Did the patient have an acute coronary syndrome (MI, NSTEMI, STEMI, etc) this admission?:  No                               Did the patient have a percutaneous coronary intervention (stent / angioplasty)?:  No.   _____________  Discharge Vitals Blood pressure (!) 114/56, pulse 65, temperature (!) 97.5 F (36.4 C), temperature source Oral, resp. rate 18, height 5\' 4"  (1.626 m), weight 126.2 kg, SpO2 90 %.  Filed Weights   12/31/19 1322 01/01/20 0500 01/02/20 0414  Weight: 128 kg 127 kg 126.2 kg   Orthostatic VS for the past 24 hrs:  BP- Lying Pulse- Lying BP- Sitting Pulse- Sitting BP- Standing at 0 minutes Pulse- Standing at 0 minutes  01/02/20 1240 -- -- -- -- 129/62 81  01/02/20 1238 -- -- 127/59 68 -- --  01/02/20 1236 132/63 66 -- -- -- --    Labs & Radiologic Studies    CBC Recent Labs    12/31/19 1353 12/31/19 1353 01/01/20 1635 01/02/20 0330  WBC 5.2  --   --  6.2  HGB 13.7   < > 14.6  14.3 13.4  HCT 42.5   < > 43.0  42.0 41.5  MCV 98.6  --   --  96.1  PLT 255  --   --  248   < > = values in this interval not displayed.   Basic Metabolic Panel Recent Labs    12/31/19 1353 12/31/19 1353 01/01/20 1635 01/02/20 0330  NA 139   < > 141  142 138  K 3.9   < > 3.8  3.7 4.0  CL 99  --   --  100  CO2 28  --   --  27  GLUCOSE 114*  --   --  116*  BUN 15  --   --  16  CREATININE 0.82  --   --  0.91  CALCIUM 9.6  --   --  8.9   < > = values in this interval not displayed.   Liver Function Tests Recent Labs    12/31/19 1542  AST 27  ALT 25  ALKPHOS 87  BILITOT 0.6  PROT 7.3  ALBUMIN 4.2   Recent Labs    12/31/19 1542  LIPASE 18   High Sensitivity Troponin:   Recent Labs  Lab 12/05/19 0136  12/05/19 1802 12/05/19 1941 12/31/19 1353 12/31/19 1542  TROPONINIHS 6 5 5 4 4     BNP Invalid input(s): POCBNP D-Dimer No results for input(s): DDIMER in the last 72 hours. Hemoglobin A1C No results for input(s): HGBA1C in the last 72 hours. Fasting Lipid Panel No results for input(s): CHOL, HDL, LDLCALC, TRIG, CHOLHDL, LDLDIRECT in the last 72 hours. Thyroid Function Tests No results for input(s): TSH, T4TOTAL, T3FREE, THYROIDAB in the last 72 hours.  Invalid input(s): FREET3 _____________  DG Chest 2 View  Result Date: 12/31/2019 CLINICAL DATA:  Chest pain. Additional history provided: Shortness of breath this morning. EXAM: CHEST - 2 VIEW COMPARISON:  Prior chest radiograph 12/27/2019 and earlier FINDINGS: Redemonstrated left chest dual lead pacer/AICD  device. Heart size within normal limits. Mild central pulmonary vascular congestion appears similar to prior examination. No evidence of airspace consolidation within the lungs. No frank pulmonary edema. No evidence of pleural effusion or pneumothorax. No acute bony abnormality identified. Thoracic spondylosis. Eventration of the right hemidiaphragm, chronic and unchanged. IMPRESSION: Mild central pulmonary vascular congestion, unchanged. No evidence of airspace consolidation or frank pulmonary edema. Electronically Signed   By: Kellie Simmering DO   On: 12/31/2019 14:59   DG Chest 2 View  Result Date: 12/27/2019 CLINICAL DATA:  69 year old female with shortness of breath. EXAM: CHEST - 2 VIEW COMPARISON:  Portable chest 12/04/2019 and earlier. FINDINGS: PA and lateral views. Cardiac and mediastinal contours remain within normal limits. Stable left chest dual lead cardiac pacemaker. Visualized tracheal air column is within normal limits. No pneumothorax, pulmonary edema, pleural effusion or confluent pulmonary opacity. Mild eventration of the right hemidiaphragm is stable (normal variant). No acute osseous abnormality identified. Negative  visible bowel gas pattern. Right upper quadrant cholecystectomy clips. IMPRESSION: No acute cardiopulmonary abnormality. Electronically Signed   By: Genevie Ann M.D.   On: 12/27/2019 13:50   CARDIAC CATHETERIZATION  Result Date: 01/01/2020 Normal coronary arteries Normal filling pressures No further ischemic workup  NM Myocar Multi W/Spect W/Wall Motion / EF  Result Date: 12/17/2019  No diagnostic ST segment changes indicate ischemia.  Small, moderate intensity, reversible apical anterior defect that is consistent with ischemia, although noted in the setting of breast attenuation.  This is a low risk study.  Nuclear stress EF: 74%.    DG Chest Port 1 View  Result Date: 12/04/2019 CLINICAL DATA:  SHORTNESS OF BREATH AND GENERALIZED WEAKNESS EXAM: PORTABLE CHEST 1 VIEW COMPARISON:  Radiograph 02/02/2016, CT 06/16/2007 FINDINGS: Low volumes with some basilar atelectatic change. There is increasing interstitial opacities throughout both lungs with a basilar and perihilar predominance. Some mild septal and fissural thickening is noted as well. The pulmonary vascularity is congested and indistinct. Cardiac silhouette is enlarged from prior portable radiograph. Pacer pack overlies left chest wall with leads in the right atrium and cardiac apex in similar position to comparison. No pneumothorax. No visible effusion at this time. No acute osseous or soft tissue abnormality. Degenerative changes are present in the imaged spine and shoulders. Dextrocurvature of the spine similar to prior. IMPRESSION: Cardiomegaly with increasing interstitial opacities throughout both lungs with a basilar and perihilar predominance. Findings likely represent CHF/volume overload with some developing interstitial pulmonary edema. Electronically Signed   By: Lovena Le M.D.   On: 12/04/2019 22:39   ECHOCARDIOGRAM COMPLETE  Result Date: 12/05/2019    ECHOCARDIOGRAM REPORT   Patient Name:   JESI JURGENS Date of Exam:  12/05/2019 Medical Rec #:  [id number]             Height:       64.0 in Accession #:    [id number]            Weight:       285.0 lb Date of Birth:  [birth year]              BSA:          2.274 m Patient Age:    50 years              BP:           175/79 mmHg Patient Gender: F  HR:           66 bpm. Exam Location:  [name] Procedure: 2D Echo Indications:    Dyspnea 786.09 / R06.00                 Abnormal ECG 794.31 / R94.31  History:        Patient has prior history of Echocardiogram examinations, most                 recent 10/23/2015. Signs/Symptoms:Dyspnea; Risk                 Factors:Hypertension and Non-Smoker. Bradycardia, Breast cancer,                 left breast , Bipolar disorder , Sinus node dysfunction.  Sonographer:    Leavy Cella RDCS (AE) Referring Phys: [id number] DAVID MANUEL Wilson  1. Left ventricular ejection fraction, by estimation, is 65 to 70%. The left ventricle has normal function. The left ventricle has no regional wall motion abnormalities. There is mild left ventricular hypertrophy. Left ventricular diastolic parameters are consistent with Grade I diastolic dysfunction (impaired relaxation).  2. Right ventricular systolic function is normal. The right ventricular size is normal. Tricuspid regurgitation signal is inadequate for assessing PA pressure.  3. The mitral valve is grossly normal. Trivial mitral valve regurgitation.  4. The aortic valve is tricuspid. Aortic valve regurgitation is not visualized.  5. The inferior vena cava is normal in size with greater than 50% respiratory variability, suggesting right atrial pressure of 3 mmHg. FINDINGS  Left Ventricle: Left ventricular ejection fraction, by estimation, is 65 to 70%. The left ventricle has normal function. The left ventricle has no regional wall motion abnormalities. The left ventricular internal cavity size was normal in size. There is  mild left ventricular hypertrophy. Left ventricular diastolic  parameters are consistent with Grade I diastolic dysfunction (impaired relaxation). Right Ventricle: The right ventricular size is normal. No increase in right ventricular wall thickness. Right ventricular systolic function is normal. Tricuspid regurgitation signal is inadequate for assessing PA pressure. Left Atrium: Left atrial size was normal in size. Right Atrium: Right atrial size was normal in size. Pericardium: There is no evidence of pericardial effusion. Presence of pericardial fat pad. Mitral Valve: The mitral valve is grossly normal. Trivial mitral valve regurgitation. Tricuspid Valve: The tricuspid valve is grossly normal. Tricuspid valve regurgitation is trivial. Aortic Valve: The aortic valve is tricuspid. Aortic valve regurgitation is not visualized. Mild aortic valve annular calcification. Pulmonic Valve: The pulmonic valve was not well visualized. Pulmonic valve regurgitation is not visualized. Aorta: The aortic root is normal in size and structure. Venous: The inferior vena cava is normal in size with greater than 50% respiratory variability, suggesting right atrial pressure of 3 mmHg. IAS/Shunts: No atrial level shunt detected by color flow Doppler.  LEFT VENTRICLE PLAX 2D LVIDd:         3.72 cm  Diastology LVIDs:         1.89 cm  LV e' lateral:   7.51 cm/s LV PW:         1.32 cm  LV E/e' lateral: 8.4 LV IVS:        1.06 cm  LV e' medial:    6.31 cm/s LVOT diam:     2.00 cm  LV E/e' medial:  10.0 LVOT Area:     3.14 cm  RIGHT VENTRICLE RV S prime:     14.50 cm/s TAPSE (M-mode): 1.8 cm  LEFT ATRIUM           Index       RIGHT ATRIUM           Index LA diam:      3.30 cm 1.45 cm/m  RA Area:     11.40 cm LA Vol (A2C): 33.1 ml 14.55 ml/m RA Volume:   28.90 ml  12.71 ml/m LA Vol (A4C): 37.8 ml 16.62 ml/m   AORTA Ao Root diam: 2.80 cm MITRAL VALVE MV Area (PHT): 2.87 cm    SHUNTS MV Decel Time: 264 msec    Systemic Diam: 2.00 cm MV E velocity: 63.10 cm/s MV A velocity: 93.10 cm/s MV E/A ratio:   0.68 Rozann Lesches MD Electronically signed by Rozann Lesches MD Signature Date/Time: 12/05/2019/12:38:14 PM    Final    Disposition   Patient is being discharged home today in good condition.  Follow-up Plans & Appointments     Follow-up Information    Imogene Burn, PA-C Follow up.   Specialty: Cardiology Why: Hospital follow-up scheduled for 01/21/2020 at 12:30pm with Ermalinda Barrios, one of the PAs in our office. If this date/time does not work for you, please call our office to reschedule. Contact information: [place] [address] [phone]        Kathyrn Drown, MD Follow up.   Specialty: Family Medicine Why: Please call your primary care physician's office to schedule a follow-up visit within the next 1-2 weeks.  Contact information: [place] [address] [phone]              Discharge Instructions    Diet - low sodium heart healthy   Complete by: As directed    Increase activity slowly   Complete by: As directed       Discharge Medications   Allergies as of 01/02/2020      Reactions   Imipramine Hives, Rash   Tricor [fenofibrate] Other (See Comments)   Pain, "aching"      Medication List    STOP taking these medications   amLODipine 5 MG tablet Commonly known as: NORVASC   nitroGLYCERIN 0.4 MG SL tablet Commonly known as: NITROSTAT     TAKE these medications   ALPRAZolam 1 MG 24 hr tablet Commonly known as: XANAX XR Take 1 mg by mouth every morning.   ALPRAZolam 1 MG tablet Commonly known as: XANAX Take 0.5 mg by mouth daily as needed for anxiety.   calcium carbonate 500 MG chewable tablet Commonly known as: TUMS - dosed in mg elemental calcium Chew 1-3 tablets by mouth daily as needed for indigestion or heartburn.   furosemide 20 MG tablet Commonly known as: Lasix Take 2 tablets (40 mg total) by mouth daily. Start on 5/30 What changed: how much to take   glucosamine-chondroitin  500-400 MG tablet Take 2 tablets by mouth every morning.   lamoTRIgine 200 MG tablet Commonly known as: LAMICTAL Take 400 mg by mouth at bedtime.   QUEtiapine 200 MG tablet Commonly known as: SEROQUEL Take 400 mg by mouth at bedtime.   Tylenol 8 Hour 650 MG CR tablet Generic drug: acetaminophen Take 650-1,300 mg by mouth See admin instructions. 1300MG  IN THE MORNING AND 650MG  AT BEDTIME   Vitamin D (Ergocalciferol) 1.25 MG (50000 UNIT) Caps capsule Commonly known as: DRISDOL Take 50,000 Units by mouth every Sunday.          Outstanding Labs/Studies   Repeat BMET at  follow-up.  Duration of Discharge Encounter   Greater than 30 minutes including physician time.  Signed, Darreld Mclean, PA-C 01/02/2020, 1:03 PM

## 2020-01-03 ENCOUNTER — Institutional Professional Consult (permissible substitution): Payer: PPO | Admitting: Neurology

## 2020-01-03 ENCOUNTER — Ambulatory Visit (INDEPENDENT_AMBULATORY_CARE_PROVIDER_SITE_OTHER): Payer: PPO | Admitting: *Deleted

## 2020-01-03 DIAGNOSIS — R001 Bradycardia, unspecified: Secondary | ICD-10-CM | POA: Diagnosis not present

## 2020-01-03 LAB — CUP PACEART REMOTE DEVICE CHECK
Date Time Interrogation Session: 20210624062917
Implantable Lead Implant Date: 20170627
Implantable Lead Implant Date: 20170627
Implantable Lead Location: 753859
Implantable Lead Location: 753860
Implantable Lead Model: 377
Implantable Lead Model: 377
Implantable Lead Serial Number: 49436227
Implantable Lead Serial Number: 49471106
Implantable Pulse Generator Implant Date: 20170627
Pulse Gen Model: 394969
Pulse Gen Serial Number: 68786455

## 2020-01-04 ENCOUNTER — Ambulatory Visit: Payer: PPO | Admitting: Family Medicine

## 2020-01-04 NOTE — Progress Notes (Signed)
Remote pacemaker transmission.   

## 2020-01-07 ENCOUNTER — Telehealth: Payer: Self-pay | Admitting: Neurology

## 2020-01-07 ENCOUNTER — Other Ambulatory Visit: Payer: Self-pay

## 2020-01-07 ENCOUNTER — Ambulatory Visit: Payer: PPO | Admitting: Neurology

## 2020-01-07 ENCOUNTER — Encounter: Payer: Self-pay | Admitting: Neurology

## 2020-01-07 VITALS — BP 142/82 | HR 63 | Ht 64.0 in | Wt 282.0 lb

## 2020-01-07 DIAGNOSIS — I5032 Chronic diastolic (congestive) heart failure: Secondary | ICD-10-CM | POA: Diagnosis not present

## 2020-01-07 DIAGNOSIS — Z950 Presence of cardiac pacemaker: Secondary | ICD-10-CM

## 2020-01-07 DIAGNOSIS — R0609 Other forms of dyspnea: Secondary | ICD-10-CM

## 2020-01-07 DIAGNOSIS — I495 Sick sinus syndrome: Secondary | ICD-10-CM | POA: Diagnosis not present

## 2020-01-07 DIAGNOSIS — Z9189 Other specified personal risk factors, not elsewhere classified: Secondary | ICD-10-CM | POA: Insufficient documentation

## 2020-01-07 DIAGNOSIS — E662 Morbid (severe) obesity with alveolar hypoventilation: Secondary | ICD-10-CM | POA: Insufficient documentation

## 2020-01-07 DIAGNOSIS — F3181 Bipolar II disorder: Secondary | ICD-10-CM

## 2020-01-07 DIAGNOSIS — F4323 Adjustment disorder with mixed anxiety and depressed mood: Secondary | ICD-10-CM | POA: Diagnosis not present

## 2020-01-07 NOTE — Patient Instructions (Signed)
Quality Sleep Information, Adult Quality sleep is important for your mental and physical health. It also improves your quality of life. Quality sleep means you:  Are asleep for most of the time you are in bed.  Fall asleep within 30 minutes.  Wake up no more than once a night.  Are awake for no longer than 20 minutes if you do wake up during the night. Most adults need 7-8 hours of quality sleep each night. How can poor sleep affect me? If you do not get enough quality sleep, you may have:  Mood swings.  Daytime sleepiness.  Confusion.  Decreased reaction time.  Sleep disorders, such as insomnia and sleep apnea.  Difficulty with: ? Solving problems. ? Coping with stress. ? Paying attention. These issues may affect your performance and productivity at work, school, and at home. Lack of sleep may also put you at higher risk for accidents, suicide, and risky behaviors. If you do not get quality sleep you may also be at higher risk for several health problems, including:  Infections.  Type 2 diabetes.  Heart disease.  High blood pressure.  Obesity.  Worsening of long-term conditions, like arthritis, kidney disease, depression, Parkinson's disease, and epilepsy. What actions can I take to get more quality sleep?      Stick to a sleep schedule. Go to sleep and wake up at about the same time each day. Do not try to sleep less on weekdays and make up for lost sleep on weekends. This does not work.  Try to get about 30 minutes of exercise on most days. Do not exercise 2-3 hours before going to bed.  Limit naps during the day to 30 minutes or less.  Do not use any products that contain nicotine or tobacco, such as cigarettes or e-cigarettes. If you need help quitting, ask your health care provider.  Do not drink caffeinated beverages for at least 8 hours before going to bed. Coffee, tea, and some sodas contain caffeine.  Do not drink alcohol close to bedtime.  Do not  eat large meals close to bedtime.  Do not take naps in the late afternoon.  Try to get at least 30 minutes of sunlight every day. Morning sunlight is best.  Make time to relax before bed. Reading, listening to music, or taking a hot bath promotes quality sleep.  Make your bedroom a place that promotes quality sleep. Keep your bedroom dark, quiet, and at a comfortable room temperature. Make sure your bed is comfortable. Take out sleep distractions like TV, a computer, smartphone, and bright lights.  If you are lying awake in bed for longer than 20 minutes, get up and do a relaxing activity until you feel sleepy.  Work with your health care provider to treat medical conditions that may affect sleeping, such as: ? Nasal obstruction. ? Snoring. ? Sleep apnea and other sleep disorders.  Talk to your health care provider if you think any of your prescription medicines may cause you to have difficulty falling or staying asleep.  If you have sleep problems, talk with a sleep consultant. If you think you have a sleep disorder, talk with your health care provider about getting evaluated by a specialist. Where to find more information  National Sleep Foundation website: https://sleepfoundation.org  National Heart, Lung, and Blood Institute (NHLBI): www.nhlbi.nih.gov/files/docs/public/sleep/healthy_sleep.pdf  Centers for Disease Control and Prevention (CDC): www.cdc.gov/sleep/index.html Contact a health care provider if you:  Have trouble getting to sleep or staying asleep.  Often wake up   very early in the morning and cannot get back to sleep.  Have daytime sleepiness.  Have daytime sleep attacks of suddenly falling asleep and sudden muscle weakness (narcolepsy).  Have a tingling sensation in your legs with a strong urge to move your legs (restless legs syndrome).  Stop breathing briefly during sleep (sleep apnea).  Think you have a sleep disorder or are taking a medicine that is  affecting your quality of sleep. Summary  Most adults need 7-8 hours of quality sleep each night.  Getting enough quality sleep is an important part of health and well-being.  Make your bedroom a place that promotes quality sleep and avoid things that may cause you to have poor sleep, such as alcohol, caffeine, smoking, and large meals.  Talk to your health care provider if you have trouble falling asleep or staying asleep. This information is not intended to replace advice given to you by your health care provider. Make sure you discuss any questions you have with your health care provider. Document Revised: 10/05/2017 Document Reviewed: 10/05/2017 Elsevier Patient Education  [place]. Hypersomnia Hypersomnia is a condition in which a person feels very tired during the day even though he or she gets plenty of sleep at night. A person with this condition may take naps during the day and may find it very difficult to wake up from sleep. Hypersomnia may affect a person's ability to think, concentrate, drive, or remember things. What are the causes? The cause of this condition may not be known. Possible causes include:  Certain medicines.  Sleep disorders, such as narcolepsy and sleep apnea.  Injury to the head, brain, or spinal cord.  Drug or alcohol use.  Gastroesophageal reflux disease (GERD).  Tumors.  Certain medical conditions, such as depression, diabetes, or an underactive thyroid gland (hypothyroidism). What are the signs or symptoms? The main symptoms of hypersomnia include:  Feeling very tired throughout the day, regardless of how much sleep you got the night before.  Having trouble waking up. Others may find it difficult to wake you up when you are sleeping.  Sleeping for longer and longer periods at a time.  Taking naps throughout the day. Other symptoms may include:  Feeling restless, anxious, or annoyed.  Lacking energy.  Having trouble  with: ? Remembering. ? Speaking. ? Thinking.  Loss of appetite.  Seeing, hearing, tasting, smelling, or feeling things that are not real (hallucinations). How is this diagnosed? This condition may be diagnosed based on:  Your symptoms and medical history.  Your sleeping habits. Your health care provider may ask you to write down your sleeping habits in a daily sleep log, along with any symptoms you have.  A series of tests that are done while you sleep (sleep study or polysomnogram).  A test that measures how quickly you can fall asleep during the day (daytime nap study or multiple sleep latency test). How is this treated? Treatment can help you manage your condition. Treatment may include:  Following a regular sleep routine.  Lifestyle changes, such as changing your eating habits, getting regular exercise, and avoiding alcohol or caffeinated beverages.  Taking medicines to make you more alert (stimulants) during the day.  Treating any underlying medical causes of hypersomnia. Follow these instructions at home: Sleep routine   Schedule the same bedtime and wake-up time each day.  Practice a relaxing bedtime routine. This may include reading, meditation, deep breathing, or taking a warm bath before going to sleep.  Get regular exercise  each day. Avoid strenuous exercise in the evening hours.  Keep your sleep environment at a cooler temperature, darkened, and quiet.  Sleep with pillows and a mattress that are comfortable and supportive.  Schedule short 20-minute naps for when you feel sleepiest during the day.  Talk with your employer or teachers about your hypersomnia. If possible, adjust your schedule so that: ? You have a regular daytime work schedule. ? You can take a scheduled nap during the day. ? You do not have to work or be active at night.  Do not eat a heavy meal for a few hours before bedtime. Eat your meals at about the same times every day.  Avoid  drinking alcohol or caffeinated beverages. Safety   Do not drive or use heavy machinery if you are sleepy. Ask your health care provider if it is safe for you to drive.  Wear a life jacket when swimming or spending time near water. General instructions  Take supplements and over-the-counter and prescription medicines only as told by your health care provider.  Keep a sleep log that will help your doctor manage your condition. This may include information about: ? What time you go to bed each night. ? How often you wake up at night. ? How many hours you sleep at night. ? How often and for how long you nap during the day. ? Any observations from others, such as leg movements during sleep, sleep walking, or snoring.  Keep all follow-up visits as told by your health care provider. This is important. Contact a health care provider if:  You have new symptoms.  Your symptoms get worse. Get help right away if:  You have serious thoughts about hurting yourself or someone else. If you ever feel like you may hurt yourself or others, or have thoughts about taking your own life, get help right away. You can go to your nearest emergency department or call:  Your local emergency services (911 in the [place]  A suicide crisis helpline, such as the [facility] at (802) 792-3787. This is open 24 hours a day. Summary  Hypersomnia refers to a condition in which you feel very tired during the day even though you get plenty of sleep at night.  A person with this condition may take naps during the day and may find it very difficult to wake up from sleep.  Hypersomnia may affect a person's ability to think, concentrate, drive, or remember things.  Treatment, such as following a regular sleep routine and making some lifestyle changes, can help you manage your condition. This information is not intended to replace advice given to you by your health care provider. Make sure you  discuss any questions you have with your health care provider. Document Revised: 06/30/2017 Document Reviewed: 06/30/2017 Elsevier Patient Education  2020 [employer].

## 2020-01-07 NOTE — Telephone Encounter (Signed)
I talked to Patient and she was alone patient said she has been feeling this way for six weeks. I asked patient had she seen her PCP for this she said no . I relayed to patient if she needed me to call 911 and she relayed she was fine . I relayed to patient to please follow up with her pcp or call 911 . I did speak to RN Myriam Jacobson about this for direction .

## 2020-01-07 NOTE — Telephone Encounter (Signed)
Patient forgot to tell Dr. Brett Fairy when she stands she feels very week and dizzy and nauseated she just wanted to make Dr. Beacher May aware .

## 2020-01-07 NOTE — Progress Notes (Signed)
SLEEP MEDICINE CLINIC    Provider:  Larey Seat, MD  Primary [name] Physician:  [name], [name]     Referring Provider: Kathyrn [name], [place],  [place] [id number]          Chief Complaint according to patient   Patient presents with:    . New Patient (Initial Visit)     pt alone, rm 11. presents today she was recently dx with CHF and has been in and out hospital. Cardio recommend a sleep evaluation- she states that she had a PSG 20 or more years ago that was negative.  she does snore in sleep and has concerns of feeling exhausted      HISTORY OF PRESENT ILLNESS:  [name] is a 69 y.o. year old White or Caucasian female patient and was seen here upon a referral on 01/07/2020 from Andrea Wolfgang Phoenix, MD and cardiologist Andrea. Crissie [name] and Andrea. Domenic Polite, MD  Chief concern according to patient :   She has been very fatigued short of breath and feels exhausted.  She has bipolar disorder and felt memory was affected has been seen by [name] within the last 3 years    [name] reports that her primary [name] physician had urged her probably a year ago to go through a sleep evaluation but about doing the last 6 weeks she had cardiovascular evaluations was diagnosed with CHF and have been repeatedly  admitted and observed in the hospital for short-term.  Her cardiologist now urges the sleep study as well.  She has been very fatigued short of breath and feels exhausted.  She has never been a smoker or tobacco user in any form she has a past history of depression anxiety, she had a pacemaker placed 3 years ago, she is a breast cancer survivor left breast, she has a history of hypertension, bilateral knee replacements colonoscopies she underwent a lumpectomy of the left breast and over 10 years ago had a cholecystectomy and hysterectomy.  The diagnosis of CHF was stated on 12-05-19.  She reports that she  was fluid overloaded was admitted to [facility] and had an echocardiogram she followed up with the cardiology nurse practitioner [name] on 2 June had a normal stress test on 4 June and on 21 June just last week was evaluated with a normal cardiac cath.  She was hospitalized until 6-23.  Her cardiologist states that she had recent dyspnea due to diastolic heart failure, intermittent wheezing was noted and her chest x-ray had revealed interstitial edema.  Cardiac enzymes were negative the EKG did not show acute changes.  She was diuresed and her echocardiogram showed a normal ejection fraction with a grade 1 diastolic dysfunction.  Andrea. Domenic [name] agreed with outpatient follow-up discharged on low-dose Lasix.  She had seen [name] in October 2020 for sinus node dysfunction s/p Biotronik PPM insertion in the year 2018.     Sleep relevant medical history: Nocturia: may be once ,  Tonsillectomy in childhood, no TBI, no ENT procedures but wisdom teeth removed. .   Family medical [name] history: No other family member on CPAP with OSA, insomnia, and no sleep walkers.    Social history:  Patient is retired from Nursing profession and left at age 48 , she  lives in a household with her niece- her husband lives in psychiatric facility-  Family status is married , with  3 adult children, no grandchildren.  Pets are present, a cat.  Tobacco use- never .  ETOH use; never ,  Caffeine intake in form of Coffee( 2-3) Soda( none) Tea ( none ) -no energy drinks. Regular exercise in form of walking, [employer] member  Hobbies : gardening       Sleep habits are as follows: The patient's dinner time is between 5 PM. The patient goes to bed at 11 PM and continues to sleep for 8 hours, wakes rarely bathroom breaks,and wakes still dizzy and not refreshed, going back to bed , feeling anxious, fleeing back to bed- and stays there for another 3-4 hours. She may get up at 2 PM.  The preferred sleep position is I on her right side or in  supine, with the support of 1 pillow. Dreams are reportedly rare.  Noon- 2 PM is the usual rise time. The patient wakes up spontaneously.  She reports not feeling refreshed or restored in AM, with symptoms such as dry mouth, dizziness, anxiety, palpitations,   and residual fatigue.  Naps are taken frequently, lasting from 1-4 hours and are more refreshing than nocturnal sleep.    Review of Systems: Out of a complete 14 system review, the patient complains of only the following symptoms, and all other reviewed systems are negative.:  Fatigue, sleepiness , snoring, HYPERSOMNIA up to 12 hours, CHF fatigue, SOB, cold feet.    How likely are you to doze in the following situations: 0 = not likely, 1 = slight chance, 2 = moderate chance, 3 = high chance   Sitting and Reading? Watching Television? Sitting inactive in a public place (theater or meeting)? As a passenger in a car for an hour without a break? Lying down in the afternoon when circumstances permit? Sitting and talking to someone? Sitting quietly after lunch without alcohol? In a car, while stopped for a few minutes in traffic?   Total = 8- 16/ 24 points - much more in the last 6-8 weeks.   FSS endorsed at 54/ 63 points.   GDS - 8 points/ 15 - high degree, followed by [name].   Social History   Socioeconomic History  . Marital status: Married    Spouse name: Not on file  . Number of children: 3  . Years of education: college  . Highest education level: Associate degree: occupational, Hotel manager, or vocational program  Occupational History  . Occupation: Therapist, sports  Tobacco Use  . Smoking status: Never Smoker  . Smokeless tobacco: Never Used  Vaping Use  . Vaping Use: Never used  Substance and Sexual Activity  . Alcohol use: No    Alcohol/week: 0.0 standard drinks  . Drug use: No  . Sexual activity: Never  Other Topics Concern  . Not on file  Social History Narrative   04/24/2013 [name] was born and grew up in [place]. She reports that her childhood was "tough." She has 2 older sisters and a younger brother. She achieved an Associates Degree in nursing. 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 She has 2 daughters and one son. She denies any legal difficulties. She affiliates as Engineer, manufacturing. Her hobbies include reading, sewing, crafts, and cooking. She reports that her social support system consists of her friend and passed her. 04/24/2013 [name]      Lives alone.   Right-handed.   No caffeine use.   Social Determinants of Health  Financial Resource Strain:   . Difficulty of Paying Living Expenses:   Food Insecurity:   . Worried About Charity fundraiser in the Last Year:   . Arboriculturist in the Last Year:   Transportation Needs:   . Film/video editor (Medical):   [name] Lack of Transportation (Non-Medical):   Physical Activity:   . Days of Exercise per Week:   . Minutes of Exercise per Session:   Stress:   . Feeling of Stress :   Social Connections:   . Frequency of Communication with Friends and Family:   . Frequency of Social Gatherings with Friends and Family:   . Attends Religious Services:   . Active Member of Clubs or Organizations:   . Attends Archivist Meetings:   [name] Marital Status:     Family History  Problem Relation Age of Onset  . Bipolar disorder Mother   . Diabetes Mother   . Bipolar disorder Sister   . Diabetes Sister   . Alcohol abuse Father   . Hypertension Father   . Heart disease Other     Past Medical History:  Diagnosis Date  . Anxiety   . Bipolar 1 disorder ([place])   . 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 Breast cancer, left breast ([place]) 07/22/2011  . Cancer ([place])   . CHF (congestive heart failure) ([place])   . Depression   . High cholesterol   . Hypertension   . IBS (irritable bowel syndrome)   . Impaired fasting glucose   . Memory loss   . OA  (osteoarthritis)   . Obesity   . Personal history of chemotherapy 2009  . Personal history of radiation therapy 2009  . Polycystic ovarian disease   . 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 Sleep apnea     Past Surgical History:  Procedure Laterality Date  . ABDOMINAL HYSTERECTOMY    . APPENDECTOMY    . BREAST BIOPSY Left 2011  . BREAST BIOPSY  2008  . BREAST LUMPECTOMY Left 2008  . CHOLECYSTECTOMY    . COLONOSCOPY N/A 09/13/2013   Procedure: COLONOSCOPY;  Surgeon: [name], MD;  [facility];  Service: Endoscopy;  Laterality: N/A;  730  . EP IMPLANTABLE DEVICE N/A 01/06/2016   Procedure: Pacemaker Implant;  Surgeon: [name], MD;  Location: [facility];  Service: Cardiovascular;  Laterality: N/A;  . ESOPHAGOGASTRODUODENOSCOPY N/A 01/17/2014   Procedure: ESOPHAGOGASTRODUODENOSCOPY (EGD);  Surgeon: [name], MD;  [facility];  Service: Endoscopy;  Laterality: N/A;  245  . RIGHT/LEFT HEART CATH AND CORONARY ANGIOGRAPHY N/A 01/01/2020   Procedure: RIGHT/LEFT HEART CATH AND CORONARY ANGIOGRAPHY;  Surgeon: [name], MD;  Location: [facility];  Service: Cardiovascular;  Laterality: N/A;  . TOTAL KNEE ARTHROPLASTY Bilateral right knee   2012, 2007     Current Outpatient Medications on File Prior to Visit  Medication Sig Dispense Refill  . acetaminophen (TYLENOL 8 HOUR) 650 MG CR tablet Take 650-1,300 mg by mouth See admin instructions. 1300MG  IN THE MORNING AND 650MG  AT BEDTIME    . ALPRAZolam (XANAX XR) 1 MG 24 hr tablet Take 1 mg by mouth every morning.     [name] ALPRAZolam (XANAX) 1 MG tablet Take 0.5 mg by mouth daily as needed for anxiety.     [name] amLODipine (NORVASC) 5 MG tablet Take 5 mg by mouth daily.    [name]  calcium carbonate (TUMS - DOSED IN MG ELEMENTAL CALCIUM) 500 MG chewable tablet Chew 1-3 tablets by mouth daily as needed for indigestion or heartburn.    . furosemide (LASIX) 20 MG tablet Take 2  tablets (40 mg total) by mouth daily. Start on 5/30 60 tablet 2  . glucosamine-chondroitin 500-400 MG tablet Take 2 tablets by mouth every morning.     . lamoTRIgine (LAMICTAL) 200 MG tablet Take 400 mg by mouth at bedtime.     . Multiple Vitamin (MULTIVITAMIN) tablet Take 1 tablet by mouth daily.    . QUEtiapine (SEROQUEL) 200 MG tablet Take 400 mg by mouth at bedtime.     . Vitamin D, Ergocalciferol, (DRISDOL) 50000 units CAPS capsule Take 50,000 Units by mouth every Sunday.      No current facility-administered medications on file prior to visit.    Allergies  Allergen Reactions  . Imipramine Hives and Rash  . Tricor [Fenofibrate] Other (See Comments)    Pain, "aching"    Physical exam:  Today's Vitals   01/07/20 0851  BP: (!) 142/82  Pulse: 63  Weight: 282 lb (127.9 kg)  Height: 5\' 4"  (1.626 m)   Body mass index is 48.41 kg/m.   Wt Readings from Last 3 Encounters:  01/07/20 282 lb (127.9 kg)  01/02/20 278 lb 4.8 oz (126.2 kg)  12/12/19 283 lb (128.4 kg)     Ht Readings from Last 3 Encounters:  01/07/20 5\' 4"  (1.626 m)  12/31/19 5\' 4"  (1.626 m)  12/12/19 5\' 4"  (1.626 m)      General: The patient is awake, alert and appears not in acute distress. The patient is well groomed. Head: Normocephalic, atraumatic. Neck is supple. Mallampati  2,  neck circumference:18 inches .  Nasal airflow barely patent.   Retrognathia is seen.   Cardiovascular:  Regular rate and cardiac rhythm by pulse,  without distended neck veins. Respiratory: Lungs are clear to auscultation.  Skin:  Without evidence of ankle edema, or rash. Trunk: BMI is 48.41 kg/m2.  The patient's posture is reclined,   Neurologic exam : The patient is awake and alert, oriented to place and time.   Memory subjective described as intact.  Attention span & concentration ability appears normal.  Speech is fluent,  without  dysarthria, dysphonia or aphasia.  Mood and affect are appropriate.   Cranial nerves:  no loss of smell or taste reported  Pupils are equal and briskly reactive to light. Funduscopic exam deferred.  Extraocular movements in vertical and horizontal planes were intact and without nystagmus. No Diplopia. Visual fields by finger perimetry are intact. Hearing was intact to soft voice and finger rubbing.  Facial sensation intact to fine touch.  Facial motor strength is symmetric and tongue and uvula move midline.  Neck ROM : rotation, tilt and flexion extension were normal for age and shoulder shrug was symmetrical.    Motor exam:  Symmetric bulk, tone and ROM.   Normal tone without cog- wheeling, symmetric grip strength .   Sensory:  Fine touch, pinprick and vibration were tested  and  normal.  Proprioception tested in the upper extremities was normal.   Coordination: Rapid alternating movements in the fingers/hands were of normal speed.  The Finger-to-nose maneuver with evidence of dysmetria and tremor on the right more than left-.   Gait and station: Patient could rise unassisted from a seated position, but needed to brace herself. She  walked without assistive device. She was SOB.  Stance is of  normal width/ base and the patient turned with 4 steps.  Toe and heel walk were deferred.  Deep tendon reflexes: in the  upper and lower extremities are symmetric and intact.  Babinski response was deferred .     After spending a total time of 45 minutes face to face and additional time for physical and neurologic examination, review of laboratory studies,  personal review of imaging studies, reports and results of other testing and review of referral information / records as far as provided in visit, I have established the following assessments:Bipolar patient on many psych meds- highly depressed and this is likely part of her hypersomnia- the "nesting " in bed as a safe place , hiding from anxiety-  now CHF dx and high risk for OHV , OSA, CSA.    In short, Jake Goodson is  presenting with many risk factors-   [name] has intrinsic risk factors to have central or obstructive sleep apnea.    At baseline today her blood pressure was elevated, she was not short of breath while speaking, she has a mild action tremor in both hands dysmetria on the right, her BMI has reached 48.5 and predisposes her further to obesity hypoventilation.  Also her degree of diastolic dysfunction was supposedly mild, I would consider her recent cardiac history placing her at high risk.  I will order an attended sleep study because I do want to be able to split the protocol if she reaches 30 apneas per hour or more and she is fully vaccinated so a split-night protocol can be implemented. I did not address any memory concerns, balance concerns or further evaluated the tremor, as she is still an active patient of my partner Andrea. Krista [name] when she saw her in June/ July 2019.    My Plan is to proceed with: attended SPLIT night protocol.     I would like to thank Andrea Drown, MD and Andrea.  Crissie [name],  [name] , for allowing me to meet with and to take [name] of this pleasant patient.   I plan to follow up either personally or through one of our NPs  within 2-3 month.   CC: I will share my note with all physicians named above.   Electronically signed by: Larey Seat, MD 01/07/2020 9:10 AM  [facility] Board certified by The [employer] of Sleep Medicine and Diplomate of the [employer] of Sleep Medicine. Board certified In Neurology through the [place], Fellow of the [employer] of Neurology. Medical Director of [facility].

## 2020-01-07 NOTE — Telephone Encounter (Signed)
Instructions were provided to Hinton Dyer that since the patient was here for sleep consult and VSS at the time of the visit, if she continues to feel weak and have dizziness she should reach out to her PCP and of course call 911 if worsening. Advised that patient should have someone come drive her if too weak and dizzy. Pt should not be driving alone. Pt verbalized understanding and will follow up with PCP

## 2020-01-08 NOTE — Progress Notes (Signed)
Cardiology Office Note    Date:  01/21/2020   ID:  [name], [place] 17-Oct-1950, MRN [id number]  PCP:  Andrea Drown, MD  Cardiologist: Andrea Lesches, MD EPS: Andrea Peru, MD  Chief Complaint  Patient presents with  . Hospitalization Follow-up    History of Present Illness:  [name] is a 69 y.o. female with a history of sinus dysfunction s/p PPM followed by Dr. Lovena [name], chronic diastolic CHF, sleep apnea, hypertension,hyperlipidemia, anxiety/depression/bipolar disorder.  Patient had worsening dyspnea on exertion echo 12/05/2019 LVEF 65 to 70% with normal wall motion mild LVH and grade 1 DD.  Myoview 12/14/2019 was difficult to interpret a small reversible apical anterior defect consistent with ischemia but in the setting of breast attenuation.  The ED 12/31/2019 with chest pain shortness of breath and nausea.  Cardiac cath showed normal coronary arteries and LV function.  Patient diuresed.  Dyspnea was felt to be multifactorial due to diastolic dysfunction, deconditioning, and obesity.  Lasix increased prior to discharge.  She did become orthostatic in the hospital and amlodipine was stopped with resolution of dizziness and orthostasis.  Patient comes in for f/u. BP running high 170/100. Better after she takes her psych meds at night. Breathing better. Trying to be more active. Has lost 10 lbs. Is going to have a sleep study. Avoids salt.   Past Medical History:  Diagnosis Date  . Anxiety   . Bipolar 1 disorder ([place])   . 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 Breast cancer, left breast ([place]) 07/22/2011  . Cancer ([place])   . CHF (congestive heart failure) ([place])   . Depression   . High cholesterol   . Hypertension   . IBS (irritable bowel syndrome)   . Impaired fasting glucose   . Memory loss   . OA (osteoarthritis)   . Obesity   . Personal history of chemotherapy 2009  . Personal history of radiation therapy 2009  .  Polycystic ovarian disease   . 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 Sleep apnea     Past Surgical History:  Procedure Laterality Date  . ABDOMINAL HYSTERECTOMY    . APPENDECTOMY    . BREAST BIOPSY Left 2011  . BREAST BIOPSY  2008  . BREAST LUMPECTOMY Left 2008  . CHOLECYSTECTOMY    . COLONOSCOPY N/A 09/13/2013   Procedure: COLONOSCOPY;  Surgeon: [name], MD;  [facility];  Service: Endoscopy;  Laterality: N/A;  730  . EP IMPLANTABLE DEVICE N/A 01/06/2016   Procedure: Pacemaker Implant;  Surgeon: [name], MD;  Location: [facility];  Service: Cardiovascular;  Laterality: N/A;  . ESOPHAGOGASTRODUODENOSCOPY N/A 01/17/2014   Procedure: ESOPHAGOGASTRODUODENOSCOPY (EGD);  Surgeon: [name], MD;  Location[facility];  Service: Endoscopy;  Laterality: N/A;  245  . RIGHT/LEFT HEART CATH AND CORONARY ANGIOGRAPHY N/A 01/01/2020   Procedure: RIGHT/LEFT HEART CATH AND CORONARY ANGIOGRAPHY;  Surgeon: [name], MD;  Location: [facility];  Service: Cardiovascular;  Laterality: N/A;  . TOTAL KNEE ARTHROPLASTY Bilateral right knee   2012, 2007    Current Medications: Current Meds  Medication Sig  . acetaminophen (TYLENOL 8 HOUR) 650 MG CR tablet Take 650-1,300 mg by mouth See admin instructions. 1300MG  IN THE MORNING AND 650MG  AT BEDTIME  . ALPRAZolam (XANAX XR) 1 MG 24 hr tablet Take 1 mg by mouth every morning.   [name] ALPRAZolam (XANAX) 1 MG  tablet Take 0.5 mg by mouth daily as needed for anxiety.   . calcium carbonate (TUMS - DOSED IN MG ELEMENTAL CALCIUM) 500 MG chewable tablet Chew 1-3 tablets by mouth daily as needed for indigestion or heartburn.  . furosemide (LASIX) 20 MG tablet Take 2 tablets (40 mg total) by mouth daily. Start on 5/30  . glucosamine-chondroitin 500-400 MG tablet Take 2 tablets by mouth every morning.   . lamoTRIgine (LAMICTAL) 200 MG tablet Take 400 mg by mouth at bedtime.   . Multiple Vitamin  (MULTIVITAMIN) tablet Take 1 tablet by mouth daily.  . QUEtiapine (SEROQUEL) 200 MG tablet Take 400 mg by mouth at bedtime.   . Vitamin D, Ergocalciferol, (DRISDOL) 50000 units CAPS capsule Take 50,000 Units by mouth every Sunday.      Allergies:   Imipramine and Tricor [fenofibrate]   Social History   Socioeconomic History  . Marital status: Married    Spouse name: Not on file  . Number of children: 3  . Years of education: college  . Highest education level: Associate degree: occupational, Hotel manager, or vocational program  Occupational History  . Occupation: Therapist, sports  Tobacco Use  . Smoking status: Never Smoker  . Smokeless tobacco: Never Used  Vaping Use  . Vaping Use: Never used  Substance and Sexual Activity  . Alcohol use: No    Alcohol/week: 0.0 standard drinks  . Drug use: No  . Sexual activity: Never  Other Topics Concern  . Not on file  Social History Narrative   04/24/2013 [name] was born and grew up in [place]. She reports that her childhood was "tough." She has 2 older sisters and a younger brother. She achieved an Associates Degree in nursing. 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 She has 2 daughters and one son. She denies any legal difficulties. She affiliates as Engineer, manufacturing. Her hobbies include reading, sewing, crafts, and cooking. She reports that her social support system consists of her friend and passed her. 04/24/2013 [name]      Lives alone.   Right-handed.   No caffeine use.   Social Determinants of Health   Financial Resource Strain:   . Difficulty of Paying Living Expenses:   Food Insecurity:   . Worried About Charity fundraiser in the Last Year:   . Arboriculturist in the Last Year:   Transportation Needs:   . Film/video editor (Medical):   [name] Lack of Transportation (Non-Medical):   Physical Activity:   . Days of Exercise per Week:   . Minutes of Exercise per Session:   Stress:   .  Feeling of Stress :   Social Connections:   . Frequency of Communication with Friends and Family:   . Frequency of Social Gatherings with Friends and Family:   . Attends Religious Services:   . Active Member of Clubs or Organizations:   . Attends Archivist Meetings:   [name] Marital Status:      Family History:  The patient's family history includes Alcohol abuse in her father; Bipolar disorder in her mother and sister; Diabetes in her mother and sister; Heart disease in an other family member; Hypertension in her father.   ROS:   Please see the history of present illness.    ROS All other systems reviewed and are negative.   PHYSICAL EXAM:   VS:  BP (!) 144/90   Pulse 67  Ht 5\' 4"  (1.626 m)   Wt 280 lb (127 kg)   SpO2 96%   BMI 48.06 kg/m   Physical Exam  GEN: Obese, in no acute distress  Neck: no JVD, carotid bruits, or masses Cardiac:RRR; no murmurs, rubs, or gallops  Respiratory:  clear to auscultation bilaterally, normal work of breathing GI: soft, nontender, nondistended, + BS Ext: without cyanosis, clubbing, or edema, Good distal pulses bilaterally Neuro:  Alert and Oriented x 3 Psych: euthymic mood, full affect  Wt Readings from Last 3 Encounters:  01/21/20 280 lb (127 kg)  01/15/20 281 lb (127.5 kg)  01/07/20 282 lb (127.9 kg)      Studies/Labs Reviewed:   EKG:  EKG is not ordered today.   Recent Labs: 12/04/2019: B Natriuretic Peptide 46.0 12/31/2019: ALT 25 01/02/2020: BUN 16; Creatinine, Ser 0.91; Hemoglobin 13.4; Platelets 248; Potassium 4.0; Sodium 138 01/15/2020: TSH 1.580   Lipid Panel    Component Value Date/Time   CHOL 158 12/21/2018 0810   TRIG 249 (H) 12/21/2018 0810   HDL 38 (L) 12/21/2018 0810   CHOLHDL 4.2 12/21/2018 0810   CHOLHDL 3.3 08/22/2014 0828   VLDL 27 08/22/2014 0828   LDLCALC 70 12/21/2018 0810    Additional studies/ records that were reviewed today include:  Right/Left Cardiac Catheterization 01/01/2020: Normal  coronary arteries. Normal filling pressures.   No further ischemic workup.   Echo 12/05/2019 IMPRESSIONS     1. Left ventricular ejection fraction, by estimation, is 65 to 70%. The  left ventricle has normal function. The left ventricle has no regional  wall motion abnormalities. There is mild left ventricular hypertrophy.  Left ventricular diastolic parameters  are consistent with Grade I diastolic dysfunction (impaired relaxation).   2. Right ventricular systolic function is normal. The right ventricular  size is normal. Tricuspid regurgitation signal is inadequate for assessing  PA pressure.   3. The mitral valve is grossly normal. Trivial mitral valve  regurgitation.   4. The aortic valve is tricuspid. Aortic valve regurgitation is not  visualized.   5. The inferior vena cava is normal in size with greater than 50%  respiratory variability, suggesting right atrial pressure of 3 mmHg.   FINDINGS   Left Ventricle: Left ventricular ejection fraction, by estimation, is 65  to 70%. The left ventricle has normal function. The left ventricle has no  regional wall motion abnormalities. The left ventricular internal cavity  size was normal in size. There is   mild left ventricular hypertrophy. Left ventricular diastolic parameters  are consistent with Grade I diastolic dysfunction (impaired relaxation).   Right Ventricle: The right ventricular size is normal. No increase in  right ventricular wall thickness. Right ventricular systolic function is  normal. Tricuspid regurgitation signal is inadequate for assessing PA  pressure.   Left Atrium: Left atrial size was normal in size.   Right Atrium: Right atrial size was normal in size.   Pericardium: There is no evidence of pericardial effusion. Presence of  pericardial fat pad.   Mitral Valve: The mitral valve is grossly normal. Trivial mitral valve  regurgitation.   Tricuspid Valve: The tricuspid valve is grossly normal.  Tricuspid valve  regurgitation is trivial.   Aortic Valve: The aortic valve is tricuspid. Aortic valve regurgitation is  not visualized. Mild aortic valve annular calcification.   Pulmonic Valve: The pulmonic valve was not well visualized. Pulmonic valve  regurgitation is not visualized.   Aorta: The aortic root is normal in size and structure.  Venous: The inferior vena cava is normal in size with greater than 50%  respiratory variability, suggesting right atrial pressure of 3 mmHg.   IAS/Shunts: No atrial level shunt detected by color flow Doppler.      ASSESSMENT:    1. Shortness of breath   2. Chronic diastolic CHF (congestive heart failure) ([place])   3. Orthostatic hypotension - resolved   4. Sinus node dysfunction ([facility])   5. Essential hypertension   6. Hyperlipidemia, unspecified hyperlipidemia type      PLAN:  In order of problems listed above:  Dyspnea felt to be multifactorial including diastolic dysfunction, obesity, deconditioning.  Cardiac cath 12/2019 normal coronary arteries and LV function.  Patient has lost 10 pounds and is trying to exercise.  She is also scheduled for sleep study.  Breathing improved.  Continue low-dose Lasix.  Diastolic CHF compensated on low-dose Lasix  Orthostatic hypotension in the hospital resolved with stopping amlodipine now with elevated blood pressures.  We will try low-dose lisinopril 5 mg once daily.  She will call us next week with blood pressure readings and we can titrate up as tolerated.  Sinus node dysfunction status post pacemaker followed by Dr. Lovena [name]  Essential hypertension blood pressure elevated at home and here.  She is watching her salt closely.  Begin lisinopril and follow-up in a couple weeks.  Bmet in 2 weeks.  Hyperlipidemia triglycerides 249, LDL 70 12/2018-due for repeat-will order  Medication Adjustments/Labs and Tests Ordered: Current medicines are reviewed at length with the patient today.  Concerns  regarding medicines are outlined above.  Medication changes, Labs and Tests ordered today are listed in the Patient Instructions below. Patient Instructions  Medication Instructions:  Your physician has recommended you make the following change in your medication:  Start Lisinpril 5 mg Daily   *If you need a refill on your cardiac medications before your next appointment, please call your pharmacy*   Lab Work: Your physician recommends that you return for lab work in: 2 Weeks   If you have labs (blood work) drawn today and your tests are completely normal, you will receive your results only by: [name] MyChart Message (if you have MyChart) OR . A paper copy in the mail If you have any lab test that is abnormal or we need to change your treatment, we will call you to review the results.   Testing/Procedures: NONE    Follow-Up: At [facility], you and your health needs are our priority.  As part of our continuing mission to provide you with exceptional heart care, we have created designated Provider Care Teams.  These Care Teams include your primary Cardiologist (physician) and Advanced Practice Providers (APPs -  Physician Assistants and Nurse Practitioners) who all work together to provide you with the care you need, when you need it.  We recommend signing up for the patient portal called "MyChart".  Sign up information is provided on this After Visit Summary.  MyChart is used to connect with patients for Virtual Visits (Telemedicine).  Patients are able to view lab/test results, encounter notes, upcoming appointments, etc.  Non-urgent messages can be sent to your provider as well.   To learn more about what you can do with MyChart, go to [email or web address].    Your next appointment:   3-4 week(s)  The format for your next appointment:   In Person  Provider:   Ermalinda Barrios, PA-C   Other Instructions Thank you for choosing [facility]!  Sumner Boast, PA-C  01/21/2020 12:49 PM    [facility] [place], [place], [place]  [address] Phone: [phone]; Fax: [phone]

## 2020-01-11 ENCOUNTER — Ambulatory Visit: Payer: PPO

## 2020-01-15 ENCOUNTER — Ambulatory Visit (INDEPENDENT_AMBULATORY_CARE_PROVIDER_SITE_OTHER): Payer: PPO | Admitting: Family Medicine

## 2020-01-15 ENCOUNTER — Encounter: Payer: Self-pay | Admitting: Family Medicine

## 2020-01-15 ENCOUNTER — Other Ambulatory Visit: Payer: Self-pay

## 2020-01-15 VITALS — BP 128/74 | Temp 97.3°F | Wt 281.0 lb

## 2020-01-15 DIAGNOSIS — M8580 Other specified disorders of bone density and structure, unspecified site: Secondary | ICD-10-CM

## 2020-01-15 DIAGNOSIS — Z780 Asymptomatic menopausal state: Secondary | ICD-10-CM | POA: Diagnosis not present

## 2020-01-15 DIAGNOSIS — R0600 Dyspnea, unspecified: Secondary | ICD-10-CM

## 2020-01-15 DIAGNOSIS — Z23 Encounter for immunization: Secondary | ICD-10-CM

## 2020-01-15 DIAGNOSIS — R5383 Other fatigue: Secondary | ICD-10-CM

## 2020-01-15 NOTE — Progress Notes (Signed)
   Subjective:    Patient ID: [name], female    DOB: 24-Feb-1951, 69 y.o.   MRN: [id number]  HPI Patient comes in today for hospital follow up. She has been diagnosed with CHF and had heart cath on 6/22. Has been experiencing SOB.  Patient relates that she finds himself feeling fatigued a lot she is concerned about the CHF she is also concerned about what else other else could be going on We did discuss how her ejection fraction look good and coronary arteries look good Patient has more so diastolic dysfunction that currently is not causing CHF We also discussed how some of her shortness of breath could be multifactorial including anxiety stress as well as possibility of pulmonary issues Patient has sleep study coming up which is a good idea Patient would benefit from pulmonary evaluation and PFT May have restrictive lung disease due to morbid obesity  Review of Systems     Objective:   Physical Exam Lungs are clear no crackles heart regular no edema in the lower legs blood pressure good       Assessment & Plan:  Ejection fraction good Coronary arteries good Diastolic dysfunction Importance of healthy diet regular activity and taking her meds  Sleep study coming up a good idea for the patient  Referral to pulmonary for further evaluation possible PFT may have underlying restrictive lung disease due to morbid obesity  Thyroid testing pending due to fatigue Pneumococcal shot today Bone density recommended

## 2020-01-16 LAB — TSH: TSH: 1.58 u[IU]/mL (ref 0.450–4.500)

## 2020-01-16 LAB — T4, FREE: Free T4: 1.01 ng/dL (ref 0.82–1.77)

## 2020-01-21 ENCOUNTER — Encounter: Payer: Self-pay | Admitting: Physician Assistant

## 2020-01-21 ENCOUNTER — Other Ambulatory Visit: Payer: Self-pay

## 2020-01-21 ENCOUNTER — Ambulatory Visit: Payer: PPO | Admitting: Physician Assistant

## 2020-01-21 VITALS — BP 144/90 | HR 67 | Ht 64.0 in | Wt 280.0 lb

## 2020-01-21 DIAGNOSIS — I495 Sick sinus syndrome: Secondary | ICD-10-CM | POA: Diagnosis not present

## 2020-01-21 DIAGNOSIS — I5032 Chronic diastolic (congestive) heart failure: Secondary | ICD-10-CM | POA: Diagnosis not present

## 2020-01-21 DIAGNOSIS — E785 Hyperlipidemia, unspecified: Secondary | ICD-10-CM | POA: Diagnosis not present

## 2020-01-21 DIAGNOSIS — I10 Essential (primary) hypertension: Secondary | ICD-10-CM

## 2020-01-21 DIAGNOSIS — R0602 Shortness of breath: Secondary | ICD-10-CM

## 2020-01-21 DIAGNOSIS — I951 Orthostatic hypotension: Secondary | ICD-10-CM | POA: Diagnosis not present

## 2020-01-21 MED ORDER — LISINOPRIL 5 MG PO TABS
5.0000 mg | ORAL_TABLET | Freq: Every day | ORAL | 3 refills | Status: DC
Start: 2020-01-21 — End: 2020-03-06

## 2020-01-21 NOTE — Patient Instructions (Signed)
Medication Instructions:  Your physician has recommended you make the following change in your medication:  Start Lisinpril 5 mg Daily   *If you need a refill on your cardiac medications before your next appointment, please call your pharmacy*   Lab Work: Your physician recommends that you return for lab work in: 2 Weeks   If you have labs (blood work) drawn today and your tests are completely normal, you will receive your results only by: [name] MyChart Message (if you have MyChart) OR . A paper copy in the mail If you have any lab test that is abnormal or we need to change your treatment, we will call you to review the results.   Testing/Procedures: NONE    Follow-Up: At [facility], you and your health needs are our priority.  As part of our continuing mission to provide you with exceptional heart care, we have created designated Provider Care Teams.  These Care Teams include your primary Cardiologist (physician) and Advanced Practice Providers (APPs -  Physician Assistants and Nurse Practitioners) who all work together to provide you with the care you need, when you need it.  We recommend signing up for the patient portal called "MyChart".  Sign up information is provided on this After Visit Summary.  MyChart is used to connect with patients for Virtual Visits (Telemedicine).  Patients are able to view lab/test results, encounter notes, upcoming appointments, etc.  Non-urgent messages can be sent to your provider as well.   To learn more about what you can do with MyChart, go to [email or web address].    Your next appointment:   3-4 week(s)  The format for your next appointment:   In Person  Provider:   Ermalinda Barrios, PA-C   Other Instructions Thank you for choosing [facility]!

## 2020-01-22 ENCOUNTER — Encounter: Payer: Self-pay | Admitting: Family Medicine

## 2020-01-27 ENCOUNTER — Ambulatory Visit (INDEPENDENT_AMBULATORY_CARE_PROVIDER_SITE_OTHER): Payer: PPO | Admitting: Neurology

## 2020-01-27 DIAGNOSIS — I5032 Chronic diastolic (congestive) heart failure: Secondary | ICD-10-CM

## 2020-01-27 DIAGNOSIS — F3181 Bipolar II disorder: Secondary | ICD-10-CM

## 2020-01-27 DIAGNOSIS — E662 Morbid (severe) obesity with alveolar hypoventilation: Secondary | ICD-10-CM

## 2020-01-27 DIAGNOSIS — I495 Sick sinus syndrome: Secondary | ICD-10-CM

## 2020-01-27 DIAGNOSIS — R0609 Other forms of dyspnea: Secondary | ICD-10-CM

## 2020-01-27 DIAGNOSIS — G4733 Obstructive sleep apnea (adult) (pediatric): Secondary | ICD-10-CM

## 2020-01-27 DIAGNOSIS — Z9189 Other specified personal risk factors, not elsewhere classified: Secondary | ICD-10-CM

## 2020-01-29 ENCOUNTER — Ambulatory Visit: Payer: PPO | Admitting: Family Medicine

## 2020-02-01 ENCOUNTER — Ambulatory Visit
Admission: RE | Admit: 2020-02-01 | Discharge: 2020-02-01 | Disposition: A | Discharge status: Home / Self Care | Payer: PPO | Source: Ambulatory Visit | Attending: Family Medicine | Admitting: Family Medicine

## 2020-02-01 ENCOUNTER — Ambulatory Visit: Payer: PPO

## 2020-02-01 ENCOUNTER — Other Ambulatory Visit: Payer: Self-pay

## 2020-02-01 DIAGNOSIS — Z1231 Encounter for screening mammogram for malignant neoplasm of breast: Secondary | ICD-10-CM | POA: Diagnosis not present

## 2020-02-06 ENCOUNTER — Other Ambulatory Visit (HOSPITAL_COMMUNITY)
Admission: RE | Admit: 2020-02-06 | Discharge: 2020-02-06 | Disposition: A | Discharge status: Home / Self Care | Payer: PPO | Source: Ambulatory Visit | Attending: Physician Assistant | Admitting: Physician Assistant

## 2020-02-06 DIAGNOSIS — E785 Hyperlipidemia, unspecified: Secondary | ICD-10-CM | POA: Diagnosis not present

## 2020-02-06 LAB — BASIC METABOLIC PANEL
Anion gap: 10 (ref 5–15)
BUN: 21 mg/dL (ref 8–23)
CO2: 27 mmol/L (ref 22–32)
Calcium: 9.3 mg/dL (ref 8.9–10.3)
Chloride: 101 mmol/L (ref 98–111)
Creatinine, Ser: 0.85 mg/dL (ref 0.44–1.00)
GFR calc Af Amer: >60 mL/min (ref >60)
GFR calc non Af Amer: >60 mL/min (ref >60)
Glucose, Bld: 134 mg/dL — ABNORMAL HIGH (ref 70–99)
Potassium: 4.2 mmol/L (ref 3.5–5.1)
Sodium: 138 mmol/L (ref 135–145)

## 2020-02-06 LAB — LIPID PANEL
Cholesterol: 176 mg/dL (ref 0–200)
HDL: 35 mg/dL — ABNORMAL LOW (ref >40)
LDL Cholesterol: 95 mg/dL (ref 0–99)
Total CHOL/HDL Ratio: 5 RATIO
Triglycerides: 230 mg/dL — ABNORMAL HIGH (ref <150)
VLDL: 46 mg/dL — ABNORMAL HIGH (ref 0–40)

## 2020-02-07 ENCOUNTER — Telehealth: Payer: Self-pay | Admitting: *Deleted

## 2020-02-07 ENCOUNTER — Other Ambulatory Visit: Payer: Self-pay | Admitting: *Deleted

## 2020-02-07 DIAGNOSIS — R739 Hyperglycemia, unspecified: Secondary | ICD-10-CM

## 2020-02-07 MED ORDER — FISH OIL 1000 MG PO CAPS: 1000.0000 mg | ORAL_CAPSULE | Freq: Two times a day (BID) | ORAL | 0 refills | Status: DC

## 2020-02-07 NOTE — Telephone Encounter (Signed)
-----   Message from Imogene Burn, PA-C sent at 02/06/2020  5:08 PM EDT ----- Triglycerides high and glucose high. Needs work up for possible diabetes by PCP. Start fish oil 1000 mg twice daily and reduce simple sugars in diet. thanks

## 2020-02-08 DIAGNOSIS — R739 Hyperglycemia, unspecified: Secondary | ICD-10-CM | POA: Diagnosis not present

## 2020-02-09 LAB — HEMOGLOBIN A1C
Est. average glucose Bld gHb Est-mCnc: 126 mg/dL
Hgb A1c MFr Bld: 6 % — ABNORMAL HIGH (ref 4.8–5.6)

## 2020-02-10 ENCOUNTER — Encounter: Payer: Self-pay | Admitting: Family Medicine

## 2020-02-10 DIAGNOSIS — R7303 Prediabetes: Secondary | ICD-10-CM

## 2020-02-10 HISTORY — DX: Prediabetes: R73.03

## 2020-02-13 ENCOUNTER — Telehealth: Payer: Self-pay | Admitting: Neurology

## 2020-02-13 ENCOUNTER — Encounter: Payer: Self-pay | Admitting: Neurology

## 2020-02-13 NOTE — Telephone Encounter (Signed)
-----   Message from Larey Seat, MD sent at 02/13/2020  8:42 AM EDT ----- The Wake baseline 02 saturation was 94%, with the lowest being 79%. Time spent below 89% saturation equaled 125 minutes.  The arousals were noted as: 15 were spontaneous, 0 were associated with PLMs, 5 were associated with respiratory events. The patient had a total of 0 Periodic Limb Movements Snoring was noted. EKG in paced rhythm (NSR). IMPRESSION:  1. Mild Obstructive Sleep Apnea and hypopnea with severe oxygen desaturation, prolonged desaturation time and clustered in REM sleep. (OSA). 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    RECOMMENDATIONS:  Advise full-night, attended, PAP titration study to optimize therapy.  This patient may be oxygen dependent.

## 2020-02-13 NOTE — Procedures (Signed)
PATIENT'S NAME:  [name], [name] DOB:      [birth year]      MR#:    [id number]     DATE OF RECORDING: 01/27/2020 REFERRING M.D.:  [name], MD Study Performed:   Baseline Polysomnogram HISTORY:  69 y.o. year old Caucasian female patient was seen here upon a referral on 01/07/2020 from Dr Wolfgang Phoenix, MD and cardiologist Dr. Crissie Sickles and Dr. Domenic Polite, MD  Chief concern according to patient :   She has been very fatigued, short of breath, and feels exhausted.  She has bipolar disorder and memory is also affected- for which has been seen by Dr. Krista Blue within the last 3 years( active patient)     [name] reports that her primary care physician had urged her a year ago to go through a sleep evaluation but within the last 6 weeks she had cardiovascular evaluations, was diagnosed with CHF, and has been repeatedly admitted and observed in the hospital for short-term.  Her cardiologist now urges the sleep study as well.  She has been very fatigued short of breath and feels exhausted.  She has never been a smoker or tobacco user in any form. She has a past history of depression anxiety, she had a pacemaker placed 3 years ago, she is a breast cancer survivor /left breast, she has a history of hypertension, bilateral knee replacements colonoscopies she underwent a lumpectomy of the left breast and over 10 years ago had a cholecystectomy and hysterectomy.  The diagnosis of CHF was stated on 12-05-19.  She reports that she was fluid overloaded, admitted to [facility] where she had an echocardiogram. She followed up with the cardiology nurse practitioner on 2 June, had a normal stress test on 4 June and on 21 June just last week was evaluated with a (normal) cardiac cath.  She was hospitalized until 6-23. Her cardiologist states that she had recent dyspnea due to diastolic heart failure, intermittent wheezing was noted, and her chest x-ray had revealed interstitial edema.  After diuresis, her echocardiogram showed a  normal ejection fraction with a grade 1 diastolic dysfunction.  Dr. Domenic Polite agreed with outpatient follow-up discharged on low-dose Lasix.  She had seen Crissie Sickles in October 2020 for sinus node dysfunction s/p Biotronik PPM insertion in the year 2018.    The patient endorsed the Epworth Sleepiness Scale at 16 points.    The patient's weight 282 pounds with a height of 64 (inches), resulting in a BMI of 48.2 kg/m2.  The patient's neck circumference measured 18 inches.  CURRENT MEDICATIONS: Tylenol, Xanax, Norvasc, Lasix, Lamictal, Seroquel   PROCEDURE:  This is a multichannel digital polysomnogram utilizing the Somnostar 11.2 system.  Electrodes and sensors were applied and monitored per AASM Specifications.   EEG, EOG, Chin and Limb EMG, were sampled at 200 Hz.  ECG, Snore and Nasal Pressure, Thermal Airflow, Respiratory Effort, CPAP Flow and Pressure, Oximetry was sampled at 50 Hz. Digital video and audio were recorded.      BASELINE STUDY  Lights Out was at 22:47 and Lights On at 05:00.  Total recording time (TRT) was 373.5 minutes, with a total sleep time (TST) of 275 minutes.   The patient's sleep latency was 34 minutes.  REM latency was 266 minutes.  The sleep efficiency was 73.6 %.     SLEEP ARCHITECTURE: WASO (Wake after sleep onset) was 64 minutes.  There were 15 minutes in Stage N1, 54 minutes Stage N2, 173.5 minutes Stage N3 and 32.5 minutes  in Stage REM.  The percentage of Stage N1 was 5.5%, Stage N2 was 19.6%, Stage N3 was 63.1% and Stage R (REM sleep) was 11.8%.   RESPIRATORY ANALYSIS:  There were a total of 63 respiratory events:  12 obstructive apneas, 0 central apneas and 0 mixed apneas with a total of 12 apneas and an apnea index (AI) of 2.6 /hour. There were 51 hypopneas with a hypopnea index of 11.1 /hour. (RERAs).  The total APNEA/HYPOPNEA INDEX (AHI) was 13.7/hour.  21 events occurred in REM sleep and 66 events in NREM.  The REM AHI was  38.8 /hour, versus a non-REM AHI of  10.4. The patient spent 78 minutes of total sleep time in the supine position and 197 minutes in non-supine. The supine AHI was 37.7 versus a non-supine AHI of 4.3.  OXYGEN SATURATION & C02:  The Wake baseline 02 saturation was 94%, with the lowest being 79%. Time spent below 89% saturation equaled 125 minutes.  The arousals were noted as: 15 were spontaneous, 0 were associated with PLMs, 5 were associated with respiratory events. The patient had a total of 0 Periodic Limb Movements Snoring was noted. EKG in paced rhythm (NSR). IMPRESSION:  1. Mild Obstructive Sleep Apnea and hypopnea with severe oxygen desaturation, prolonged desaturation time and clustered in REM sleep. (OSA). 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    RECOMMENDATIONS:  Advise full-night, attended, PAP titration study to optimize therapy.  This patient may be oxygen dependent.    I certify that I have reviewed the entire raw data recording prior to the issuance of this report in accordance with the Standards of Accreditation of the American Academy of Sleep Medicine (AASM)   Larey Seat, MD Diplomat, American Board of Psychiatry and Neurology  Diplomat, American Board of Sleep Medicine Market researcher, [place] [facility] [employer]

## 2020-02-13 NOTE — Telephone Encounter (Signed)
Called patient to discuss sleep study results. No answer at this time. LVM for the patient to call back.   

## 2020-02-13 NOTE — Addendum Note (Signed)
Addended by: Larey Seat on: 02/13/2020 08:42 AM   Modules accepted: Orders

## 2020-02-13 NOTE — Progress Notes (Signed)
The [place] baseline 02 saturation was 94%, with the lowest being 79%. Time spent below 89% saturation equaled 125 minutes.  The arousals were noted as: 15 were spontaneous, 0 were associated with PLMs, 5 were associated with respiratory events. The patient had a total of 0 Periodic Limb Movements Snoring was noted. EKG in paced rhythm (NSR). IMPRESSION:  1. Mild Obstructive Sleep Apnea and hypopnea with severe oxygen desaturation, prolonged desaturation time and clustered in REM sleep. (OSA). 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    RECOMMENDATIONS:  Advise full-night, attended, PAP titration study to optimize therapy.  This patient may be oxygen dependent.

## 2020-02-19 ENCOUNTER — Telehealth: Payer: Self-pay | Admitting: Neurology

## 2020-02-19 NOTE — Telephone Encounter (Signed)
Pt said someone called her today. Pt returning phone call.

## 2020-02-20 NOTE — Progress Notes (Signed)
Cardiology Office Note    Date:  02/25/2020   ID:  [name], [name] Nov 28, 1950, MRN [id number]  PCP:  Kathyrn Drown, MD  Cardiologist: Rozann Lesches, MD EPS: Cristopher Peru, MD  Chief Complaint  Patient presents with  . Follow-up    History of Present Illness:  [name] is a 69 y.o. female with a history of sinus dysfunction s/p PPM followed by Dr. Lovena Le, chronic diastolic CHF, sleep apnea, hypertension,hyperlipidemia, anxiety/depression/bipolar disorder.   Patient had worsening dyspnea on exertion echo 12/05/2019 LVEF 65 to 70% with normal wall motion mild LVH and grade 1 DD.  Myoview 12/14/2019 was difficult to interpret a small reversible apical anterior defect consistent with ischemia but in the setting of breast attenuation.  The ED 12/31/2019 with chest pain shortness of breath and nausea.  Cardiac cath showed normal coronary arteries and LV function.  Patient diuresed.  Dyspnea was felt to be multifactorial due to diastolic dysfunction, deconditioning, and obesity.  Lasix increased prior to discharge.  She did become orthostatic in the hospital and amlodipine was stopped with resolution of dizziness and orthostasis.   I saw patient 01/21/20 and BP running high.would improve after taking her psych meds at night. I restarted her lisinopril 5 mg once daily.  Patient comes in for f/u. BP dropped so she decreased lasix 20 mg once daily. Overall feeling better. Was diagnosed with  Sleep apnea and seeing Dr. Melvyn Novas next week as well.    Past Medical History:  Diagnosis Date  . Anxiety   . Bipolar 1 disorder ([place])   . 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 Breast cancer, left breast ([place]) 07/22/2011  . Cancer ([place])   . CHF (congestive heart failure) ([place])   . Depression   . High cholesterol   . Hypertension   . IBS (irritable bowel syndrome)   . Impaired fasting glucose   . Memory loss   . OA (osteoarthritis)   .  Obesity   . Personal history of chemotherapy 2009  . Personal history of radiation therapy 2009  . Polycystic ovarian disease   . Prediabetes 02/10/2020  . 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 Sleep apnea     Past Surgical History:  Procedure Laterality Date  . ABDOMINAL HYSTERECTOMY    . APPENDECTOMY    . BREAST BIOPSY Left 2011  . BREAST BIOPSY  2008  . BREAST LUMPECTOMY Left 2008  . CHOLECYSTECTOMY    . COLONOSCOPY N/A 09/13/2013   Procedure: COLONOSCOPY;  Surgeon: [name], MD;  Location: [facility];  Service: Endoscopy;  Laterality: N/A;  730  . EP IMPLANTABLE DEVICE N/A 01/06/2016   Procedure: Pacemaker Implant;  Surgeon: [name], MD;  Location: [facility];  Service: Cardiovascular;  Laterality: N/A;  . ESOPHAGOGASTRODUODENOSCOPY N/A 01/17/2014   Procedure: ESOPHAGOGASTRODUODENOSCOPY (EGD);  Surgeon: [name], MD;  Location: [facility];  Service: Endoscopy;  Laterality: N/A;  245  . RIGHT/LEFT HEART CATH AND CORONARY ANGIOGRAPHY N/A 01/01/2020   Procedure: RIGHT/LEFT HEART CATH AND CORONARY ANGIOGRAPHY;  Surgeon: [name], MD;  Location: [facility];  Service: Cardiovascular;  Laterality: N/A;  . TOTAL KNEE ARTHROPLASTY Bilateral right knee   2012, 2007    Current Medications: Current Meds  Medication Sig  . acetaminophen (TYLENOL 8 HOUR) 650 MG CR tablet Take 650-1,300 mg by mouth See admin instructions. 1300MG  IN THE MORNING AND 650MG  AT  BEDTIME  . ALPRAZolam (XANAX XR) 1 MG 24 hr tablet Take 1 mg by mouth every morning.   [name] ALPRAZolam (XANAX) 1 MG tablet Take 0.5 mg by mouth daily as needed for anxiety.   . calcium carbonate (TUMS - DOSED IN MG ELEMENTAL CALCIUM) 500 MG chewable tablet Chew 1-3 tablets by mouth daily as needed for indigestion or heartburn.  . furosemide (LASIX) 20 MG tablet Take 20 mg by mouth.  [name] glucosamine-chondroitin 500-400 MG tablet Take 2 tablets by mouth every morning.   .  lamoTRIgine (LAMICTAL) 200 MG tablet Take 400 mg by mouth at bedtime.   [name] lisinopril (ZESTRIL) 5 MG tablet Take 1 tablet (5 mg total) by mouth daily.  . Multiple Vitamin (MULTIVITAMIN) tablet Take 1 tablet by mouth daily.  . QUEtiapine (SEROQUEL) 200 MG tablet Take 500 mg by mouth at bedtime.   . Vitamin D, Ergocalciferol, (DRISDOL) 50000 units CAPS capsule Take 50,000 Units by mouth every Sunday.      Allergies:   Imipramine and Tricor [fenofibrate]   Social History   Socioeconomic History  . Marital status: Married    Spouse name: Not on file  . Number of children: 3  . Years of education: college  . Highest education level: Associate degree: occupational, Hotel manager, or vocational program  Occupational History  . Occupation: Therapist, sports  Tobacco Use  . Smoking status: Never Smoker  . Smokeless tobacco: Never Used  Vaping Use  . Vaping Use: Never used  Substance and Sexual Activity  . Alcohol use: No    Alcohol/week: 0.0 standard drinks  . Drug use: No  . Sexual activity: Never  Other Topics Concern  . Not on file  Social History Narrative   04/24/2013 [name] was born and grew up in [place]. She reports that her childhood was "tough." She has 2 older sisters and a younger brother. She achieved an Associates Degree in nursing. 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 She has 2 daughters and one son. She denies any legal difficulties. She affiliates as Engineer, manufacturing. Her hobbies include reading, sewing, crafts, and cooking. She reports that her social support system consists of her friend and passed her. 04/24/2013 [name]      Lives alone.   Right-handed.   No caffeine use.   Social Determinants of Health   Financial Resource Strain:   . Difficulty of Paying Living Expenses:   Food Insecurity:   . Worried About Charity fundraiser in the Last Year:   . Arboriculturist in the Last Year:   Transportation Needs:   . Lexicographer (Medical):   [name] Lack of Transportation (Non-Medical):   Physical Activity:   . Days of Exercise per Week:   . Minutes of Exercise per Session:   Stress:   . Feeling of Stress :   Social Connections:   . Frequency of Communication with Friends and Family:   . Frequency of Social Gatherings with Friends and Family:   . Attends Religious Services:   . Active Member of Clubs or Organizations:   . Attends Archivist Meetings:   [name] Marital Status:      Family History:  The patient's family history includes Alcohol abuse in her father; Bipolar disorder in her mother and sister; Diabetes in her mother and sister; Heart disease in an other family member; Hypertension in her father.   ROS:  Please see the history of present illness.    ROS All other systems reviewed and are negative.   PHYSICAL EXAM:   VS:  BP 120/62   Pulse 72   Ht 5\' 4"  (1.626 m)   Wt 278 lb (126.1 kg)   SpO2 95%   BMI 47.72 kg/m   Physical Exam  GEN: Well nourished, well developed, in no acute distress  Neck:bilateral carotid bruits no JVD, or masses Cardiac:RRR; no murmurs, rubs, or gallops  Respiratory:  clear to auscultation bilaterally, normal work of breathing GI: soft, nontender, nondistended, + BS Ext: without cyanosis, clubbing, or edema, Good distal pulses bilaterally Neuro:  Alert and Oriented x 3Psych: euthymic mood, full affect  Wt Readings from Last 3 Encounters:  02/25/20 278 lb (126.1 kg)  01/21/20 280 lb (127 kg)  01/15/20 281 lb (127.5 kg)      Studies/Labs Reviewed:   EKG:  EKG is not ordered today.   Recent Labs: 12/04/2019: B Natriuretic Peptide 46.0 12/31/2019: ALT 25 01/02/2020: Hemoglobin 13.4; Platelets 248 01/15/2020: TSH 1.580 02/06/2020: BUN 21; Creatinine, Ser 0.85; Potassium 4.2; Sodium 138   Lipid Panel    Component Value Date/Time   CHOL 176 02/06/2020 0859   CHOL 158 12/21/2018 0810   TRIG 230 (H) 02/06/2020 0859   HDL 35 (L) 02/06/2020 0859    HDL 38 (L) 12/21/2018 0810   CHOLHDL 5.0 02/06/2020 0859   VLDL 46 (H) 02/06/2020 0859   LDLCALC 95 02/06/2020 0859   LDLCALC 70 12/21/2018 0810    Additional studies/ records that were reviewed today include:  Right/Left Cardiac Catheterization 01/01/2020: Normal coronary arteries. Normal filling pressures.   No further ischemic workup.   Echo 12/05/2019 IMPRESSIONS     1. Left ventricular ejection fraction, by estimation, is 65 to 70%. The  left ventricle has normal function. The left ventricle has no regional  wall motion abnormalities. There is mild left ventricular hypertrophy.  Left ventricular diastolic parameters  are consistent with Grade I diastolic dysfunction (impaired relaxation).   2. Right ventricular systolic function is normal. The right ventricular  size is normal. Tricuspid regurgitation signal is inadequate for assessing  PA pressure.   3. The mitral valve is grossly normal. Trivial mitral valve  regurgitation.   4. The aortic valve is tricuspid. Aortic valve regurgitation is not  visualized.   5. The inferior vena cava is normal in size with greater than 50%  respiratory variability, suggesting right atrial pressure of 3 mmHg.   FINDINGS   Left Ventricle: Left ventricular ejection fraction, by estimation, is 65  to 70%. The left ventricle has normal function. The left ventricle has no  regional wall motion abnormalities. The left ventricular internal cavity  size was normal in size. There is   mild left ventricular hypertrophy. Left ventricular diastolic parameters  are consistent with Grade I diastolic dysfunction (impaired relaxation).   Right Ventricle: The right ventricular size is normal. No increase in  right ventricular wall thickness. Right ventricular systolic function is  normal. Tricuspid regurgitation signal is inadequate for assessing PA  pressure.   Left Atrium: Left atrial size was normal in size.   Right Atrium: Right atrial size was  normal in size.   Pericardium: There is no evidence of pericardial effusion. Presence of  pericardial fat pad.   Mitral Valve: The mitral valve is grossly normal. Trivial mitral valve  regurgitation.   Tricuspid Valve: The tricuspid valve is grossly normal. Tricuspid valve  regurgitation is trivial.  Aortic Valve: The aortic valve is tricuspid. Aortic valve regurgitation is  not visualized. Mild aortic valve annular calcification.   Pulmonic Valve: The pulmonic valve was not well visualized. Pulmonic valve  regurgitation is not visualized.   Aorta: The aortic root is normal in size and structure.   Venous: The inferior vena cava is normal in size with greater than 50%  respiratory variability, suggesting right atrial pressure of 3 mmHg.   IAS/Shunts: No atrial level shunt detected by color flow Doppler.          ASSESSMENT:    1. Shortness of breath   2. Chronic diastolic CHF (congestive heart failure) ([place])   3. Orthostatic hypotension - resolved   4. Sinus node dysfunction ([facility])   5. Essential hypertension   6. Mixed hyperlipidemia   7. Bilateral carotid bruits      PLAN:  In order of problems listed above:  Dyspnea felt to be multifactorial including diastolic dysfunction, obesity, deconditioning.  Cardiac cath 12/2019 normal coronary arteries and LV function.  Patient has lost 10 pounds and is trying to exercise.  She has also been diagnosed with sleep apnea. Breathing improved.  She decreased lasix to 20 mg once daily. See's pulmonary next week.   Diastolic CHF compensated on low-dose Lasix/lisinopril. Went over echo with patient in detail   Orthostatic hypotension in the hospital resolved with stopping amlodipine then develpoled elevated blood pressures.  I started low-dose lisinopril 5 mg once daily. and BP's better. F/u renal normal.     Sinus node dysfunction status post pacemaker followed by Dr. Lovena Le   Essential hypertension blood pressure much better   I started lisinopril   Crt 0.85 02/06/20  Hyperlipidemia triglycerides 230, LDL 95 02/06/20-didn't tolerate fish oil   Carotid bruits-check carotid dopplers   Medication Adjustments/Labs and Tests Ordered: Current medicines are reviewed at length with the patient today.  Concerns regarding medicines are outlined above.  Medication changes, Labs and Tests ordered today are listed in the Patient Instructions below. Patient Instructions  Medication Instructions:  Your physician recommends that you continue on your current medications as directed. Please refer to the Current Medication list given to you today.  *If you need a refill on your cardiac medications before your next appointment, please call your pharmacy*   Lab Work: None  If you have labs (blood work) drawn today and your tests are completely normal, you will receive your results only by: [name] MyChart Message (if you have MyChart) OR . A paper copy in the mail If you have any lab test that is abnormal or we need to change your treatment, we will call you to review the results.   Testing/Procedures: Your physician has requested that you have a carotid duplex. This test is an ultrasound of the carotid arteries in your neck. It looks at blood flow through these arteries that supply the brain with blood. Allow one hour for this exam. There are no restrictions or special instructions.   Follow-Up: At [facility], you and your health needs are our priority.  As part of our continuing mission to provide you with exceptional heart care, we have created designated Provider Care Teams.  These Care Teams include your primary Cardiologist (physician) and Advanced Practice Providers (APPs -  Physician Assistants and Nurse Practitioners) who all work together to provide you with the care you need, when you need it.  We recommend signing up for the patient portal called "MyChart".  Sign up information is provided on  this After Visit Summary.   MyChart is used to connect with patients for Virtual Visits (Telemedicine).  Patients are able to view lab/test results, encounter notes, upcoming appointments, etc.  Non-urgent messages can be sent to your provider as well.   To learn more about what you can do with MyChart, go to [email or web address].    Your next appointment:   6 month(s)  The format for your next appointment:   In Person  Provider:   Rozann Lesches, MD   Other Instructions None     Signed, Ermalinda Barrios, PA-C  02/25/2020 1:55 PM    [address], [place], [place]  [address] Phone: [phone]; Fax: [phone]

## 2020-02-25 ENCOUNTER — Ambulatory Visit: Payer: PPO | Admitting: Physician Assistant

## 2020-02-25 ENCOUNTER — Other Ambulatory Visit: Payer: Self-pay

## 2020-02-25 ENCOUNTER — Encounter: Payer: Self-pay | Admitting: Physician Assistant

## 2020-02-25 VITALS — BP 120/62 | HR 72 | Ht 64.0 in | Wt 278.0 lb

## 2020-02-25 DIAGNOSIS — I951 Orthostatic hypotension: Secondary | ICD-10-CM

## 2020-02-25 DIAGNOSIS — I495 Sick sinus syndrome: Secondary | ICD-10-CM

## 2020-02-25 DIAGNOSIS — R0602 Shortness of breath: Secondary | ICD-10-CM

## 2020-02-25 DIAGNOSIS — R0989 Other specified symptoms and signs involving the circulatory and respiratory systems: Secondary | ICD-10-CM

## 2020-02-25 DIAGNOSIS — E782 Mixed hyperlipidemia: Secondary | ICD-10-CM

## 2020-02-25 DIAGNOSIS — I10 Essential (primary) hypertension: Secondary | ICD-10-CM

## 2020-02-25 DIAGNOSIS — I5032 Chronic diastolic (congestive) heart failure: Secondary | ICD-10-CM

## 2020-02-25 NOTE — Telephone Encounter (Signed)
Called patient to schedule CPAP study. LVM for pt to call me back

## 2020-02-25 NOTE — Patient Instructions (Signed)
Medication Instructions:  Your physician recommends that you continue on your current medications as directed. Please refer to the Current Medication list given to you today.  *If you need a refill on your cardiac medications before your next appointment, please call your pharmacy*   Lab Work: None  If you have labs (blood work) drawn today and your tests are completely normal, you will receive your results only by: [name] MyChart Message (if you have MyChart) OR . A paper copy in the mail If you have any lab test that is abnormal or we need to change your treatment, we will call you to review the results.   Testing/Procedures: Your physician has requested that you have a carotid duplex. This test is an ultrasound of the carotid arteries in your neck. It looks at blood flow through these arteries that supply the brain with blood. Allow one hour for this exam. There are no restrictions or special instructions.   Follow-Up: At [facility], you and your health needs are our priority.  As part of our continuing mission to provide you with exceptional heart care, we have created designated Provider Care Teams.  These Care Teams include your primary Cardiologist (physician) and Advanced Practice Providers (APPs -  Physician Assistants and Nurse Practitioners) who all work together to provide you with the care you need, when you need it.  We recommend signing up for the patient portal called "MyChart".  Sign up information is provided on this After Visit Summary.  MyChart is used to connect with patients for Virtual Visits (Telemedicine).  Patients are able to view lab/test results, encounter notes, upcoming appointments, etc.  Non-urgent messages can be sent to your provider as well.   To learn more about what you can do with MyChart, go to [email or web address].    Your next appointment:   6 month(s)  The format for your next appointment:   In Person  Provider:   Rozann Lesches,  MD   Other Instructions None

## 2020-02-28 ENCOUNTER — Ambulatory Visit (INDEPENDENT_AMBULATORY_CARE_PROVIDER_SITE_OTHER): Payer: PPO

## 2020-02-28 ENCOUNTER — Other Ambulatory Visit: Payer: Self-pay

## 2020-02-28 ENCOUNTER — Ambulatory Visit
Admission: EM | Admit: 2020-02-28 | Discharge: 2020-02-28 | Disposition: A | Discharge status: Home / Self Care | Payer: PPO | Attending: Emergency Medicine | Admitting: Emergency Medicine

## 2020-02-28 ENCOUNTER — Encounter: Payer: Self-pay | Admitting: Emergency Medicine

## 2020-02-28 DIAGNOSIS — R0602 Shortness of breath: Secondary | ICD-10-CM

## 2020-02-28 DIAGNOSIS — J90 Pleural effusion, not elsewhere classified: Secondary | ICD-10-CM | POA: Diagnosis not present

## 2020-02-28 MED ORDER — ALBUTEROL SULFATE HFA 108 (90 BASE) MCG/ACT IN AERS: 1.0000 | INHALATION_SPRAY | Freq: Four times a day (QID) | RESPIRATORY_TRACT | 0 refills | Status: DC | PRN

## 2020-02-28 NOTE — ED Provider Notes (Signed)
[place]   [id number] 02/28/20 Arrival Time: 1806   Chief Complaint  Patient presents with  . Shortness of Breath     SUBJECTIVE: History from: patient.  Indiyah Paone is a 69 y.o. female with history of CHF presents to urgent care for complaint of shortness of breath that started yesterday after being in the room that had mold.  Denies sick exposure to COVID, flu or strep.  Denies recent travel.  Has not tried any OTC medication.  Denies aggravating factor.  Reports symptom has improved today.  Report previous symptoms in the past.   Denies fever, chills, fatigue, sinus pain, rhinorrhea, sore throat,  wheezing, chest pain, nausea, changes in bowel or bladder habits.      ROS: As per HPI.  All other pertinent ROS negative.     Past Medical History:  Diagnosis Date  . Anxiety   . Bipolar 1 disorder ([place])   . 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 Breast cancer, left breast ([place]) 07/22/2011  . Cancer ([place])   . CHF (congestive heart failure) ([place])   . Depression   . High cholesterol   . Hypertension   . IBS (irritable bowel syndrome)   . Impaired fasting glucose   . Memory loss   . OA (osteoarthritis)   . Obesity   . Personal history of chemotherapy 2009  . Personal history of radiation therapy 2009  . Polycystic ovarian disease   . Prediabetes 02/10/2020  . 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 Sleep apnea    Past Surgical History:  Procedure Laterality Date  . ABDOMINAL HYSTERECTOMY    . APPENDECTOMY    . BREAST BIOPSY Left 2011  . BREAST BIOPSY  2008  . BREAST LUMPECTOMY Left 2008  . CHOLECYSTECTOMY    . COLONOSCOPY N/A 09/13/2013   Procedure: COLONOSCOPY;  Surgeon: [name], MD;  [facility];  Service: Endoscopy;  Laterality: N/A;  730  . EP IMPLANTABLE DEVICE N/A 01/06/2016   Procedure: Pacemaker Implant;  Surgeon: [name], MD;  Location: [facility];   Service: Cardiovascular;  Laterality: N/A;  . ESOPHAGOGASTRODUODENOSCOPY N/A 01/17/2014   Procedure: ESOPHAGOGASTRODUODENOSCOPY (EGD);  Surgeon: [name], MD;  Location: [facility];  Service: Endoscopy;  Laterality: N/A;  245  . RIGHT/LEFT HEART CATH AND CORONARY ANGIOGRAPHY N/A 01/01/2020   Procedure: RIGHT/LEFT HEART CATH AND CORONARY ANGIOGRAPHY;  Surgeon: [name], MD;  Location: [facility];  Service: Cardiovascular;  Laterality: N/A;  . TOTAL KNEE ARTHROPLASTY Bilateral right knee   2012, 2007   Allergies  Allergen Reactions  . Imipramine Hives and Rash  . Tricor [Fenofibrate] Other (See Comments)    Pain, "aching"   No current facility-administered medications on file prior to encounter.   Current Outpatient Medications on File Prior to Encounter  Medication Sig Dispense Refill  . acetaminophen (TYLENOL 8 HOUR) 650 MG CR tablet Take 650-1,300 mg by mouth See admin instructions. 1300MG  IN THE MORNING AND 650MG  AT BEDTIME    . ALPRAZolam (XANAX XR) 1 MG 24 hr tablet Take 1 mg by mouth every morning.     [name] ALPRAZolam (XANAX) 1 MG tablet Take 0.5 mg by mouth daily as needed for anxiety.     . calcium carbonate (TUMS - DOSED IN MG ELEMENTAL CALCIUM) 500 MG chewable tablet Chew 1-3 tablets by mouth daily as needed for indigestion or heartburn.    [name]  furosemide (LASIX) 20 MG tablet Take 20 mg by mouth.    [name] glucosamine-chondroitin 500-400 MG tablet Take 2 tablets by mouth every morning.     . lamoTRIgine (LAMICTAL) 200 MG tablet Take 400 mg by mouth at bedtime.     [name] lisinopril (ZESTRIL) 5 MG tablet Take 1 tablet (5 mg total) by mouth daily. 90 tablet 3  . Multiple Vitamin (MULTIVITAMIN) tablet Take 1 tablet by mouth daily.    . QUEtiapine (SEROQUEL) 200 MG tablet Take 500 mg by mouth at bedtime.     . Vitamin D, Ergocalciferol, (DRISDOL) 50000 units CAPS capsule Take 50,000 Units by mouth every Sunday.      Social History   Socioeconomic History  .  Marital status: Married    Spouse name: Not on file  . Number of children: 3  . Years of education: college  . Highest education level: Associate degree: occupational, Hotel manager, or vocational program  Occupational History  . Occupation: Therapist, sports  Tobacco Use  . Smoking status: Never Smoker  . Smokeless tobacco: Never Used  Vaping Use  . Vaping Use: Never used  Substance and Sexual Activity  . Alcohol use: No    Alcohol/week: 0.0 standard drinks  . Drug use: No  . Sexual activity: Never  Other Topics Concern  . Not on file  Social History Narrative   04/24/2013 [name] was born and grew up in [place]. She reports that her childhood was "tough." She has 2 older sisters and a younger brother. She achieved an Associates Degree in nursing. 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 She has 2 daughters and one son. She denies any legal difficulties. She affiliates as Engineer, manufacturing. Her hobbies include reading, sewing, crafts, and cooking. She reports that her social support system consists of her friend and passed her. 04/24/2013 [name]      Lives alone.   Right-handed.   No caffeine use.   Social Determinants of Health   Financial Resource Strain:   . Difficulty of Paying Living Expenses: Not on file  Food Insecurity:   . Worried About Charity fundraiser in the Last Year: Not on file  . Ran Out of Food in the Last Year: Not on file  Transportation Needs:   . Lack of Transportation (Medical): Not on file  . Lack of Transportation (Non-Medical): Not on file  Physical Activity:   . Days of Exercise per Week: Not on file  . Minutes of Exercise per Session: Not on file  Stress:   . Feeling of Stress : Not on file  Social Connections:   . Frequency of Communication with Friends and Family: Not on file  . Frequency of Social Gatherings with Friends and Family: Not on file  . Attends Religious Services: Not on file  . Active Member of  Clubs or Organizations: Not on file  . Attends Archivist Meetings: Not on file  . Marital Status: Not on file  Intimate Partner Violence:   . Fear of Current or Ex-Partner: Not on file  . Emotionally Abused: Not on file  . Physically Abused: Not on file  . Sexually Abused: Not on file   Family History  Problem Relation Age of Onset  . Bipolar disorder Mother   . Diabetes Mother   . Bipolar disorder Sister   . Diabetes Sister   . Alcohol abuse Father   . Hypertension Father   .  Heart disease Other     OBJECTIVE:  Vitals:   02/28/20 1907 02/28/20 1909  BP:  138/83  Pulse:  64  Resp:  19  Temp:  98 F (36.7 C)  TempSrc:  Oral  SpO2:  94%  Weight: 275 lb (124.7 kg)   Height: 5\' 4"  (1.626 m)     Physical Exam Vitals and nursing note reviewed.  Constitutional:      General: She is not in acute distress.    Appearance: Normal appearance. She is normal weight. She is not ill-appearing, toxic-appearing or diaphoretic.  HENT:     Head: Normocephalic.     Right Ear: Tympanic membrane, ear canal and external ear normal. There is no impacted cerumen.     Left Ear: Tympanic membrane, ear canal and external ear normal. There is no impacted cerumen.     Nose: Nose normal. No congestion.  Cardiovascular:     Rate and Rhythm: Normal rate and regular rhythm.     Pulses: Normal pulses.     Heart sounds: Normal heart sounds. No murmur heard.  No friction rub. No gallop.   Pulmonary:     Effort: Pulmonary effort is normal. No respiratory distress.     Breath sounds: Normal breath sounds. No stridor. No wheezing, rhonchi or rales.  Chest:     Chest wall: No tenderness.  Musculoskeletal:     Right lower leg: No edema.     Left lower leg: No edema.  Neurological:     Mental Status: She is alert and oriented to person, place, and time.     LABS:  No results found for this or any previous visit (from the past 24 hour(s)).   RADIOLOGY DG Chest 2 View  Result Date:  02/28/2020 CLINICAL DATA:  Shortness of breath EXAM: CHEST - 2 VIEW COMPARISON:  12/31/2019 FINDINGS: Left-sided pacing device as before. No focal opacity or pleural effusion. Stable cardiomediastinal silhouette. No pneumothorax. IMPRESSION: No active cardiopulmonary disease. Electronically Signed   By: Donavan Foil M.D.   On: 02/28/2020 19:59   ASSESSMENT & PLAN:  1. SOB (shortness of breath)     Meds ordered this encounter  Medications  . albuterol (VENTOLIN HFA) 108 (90 Base) MCG/ACT inhaler    Sig: Inhale 1-2 puffs into the lungs every 6 (six) hours as needed for wheezing or shortness of breath.    Dispense:  18 g    Refill:  0   Patient stable at discharge.  Chest x-ray is negative for cardiopulmonary disease.  I have reviewed the x-ray myself and the radiologist interpretation.  I am in agreement with the radiologist interpretation.   Discharge instructions ProAir was prescribed for shortness of breath/take as directed Continue to take Lasix as prescribed Follow-up with PCP Return or go to ED for worsening of symptoms  Reviewed expectations re: course of current medical issues. Questions answered. Outlined signs and symptoms indicating need for more acute intervention. Patient verbalized understanding. After Visit Summary given.     Note: This document was prepared using Dragon voice recognition software and may include unintentional dictation errors.     Emerson Monte, FNP 02/28/20 2010

## 2020-02-28 NOTE — ED Triage Notes (Addendum)
Pt has increased shortness of breath since yesterday after being in a room that had mold.  States she feels like she is having trouble taking a deep breath in.   Pt hospitalized x2 in the past few months for chf exacerbation.  resp even and non labored at this time. Pt ambulated to room with no difficulty.

## 2020-02-28 NOTE — Discharge Instructions (Addendum)
ProAir was prescribed for shortness of breath/take as directed Continue to take Lasix as prescribed Follow-up with PCP Return or go to ED for worsening of symptoms

## 2020-03-03 ENCOUNTER — Ambulatory Visit (HOSPITAL_COMMUNITY)
Admission: RE | Admit: 2020-03-03 | Discharge: 2020-03-03 | Disposition: A | Discharge status: Home / Self Care | Payer: PPO | Source: Ambulatory Visit | Attending: Physician Assistant | Admitting: Physician Assistant

## 2020-03-03 ENCOUNTER — Other Ambulatory Visit: Payer: Self-pay

## 2020-03-03 DIAGNOSIS — R0989 Other specified symptoms and signs involving the circulatory and respiratory systems: Secondary | ICD-10-CM | POA: Insufficient documentation

## 2020-03-03 DIAGNOSIS — I6523 Occlusion and stenosis of bilateral carotid arteries: Secondary | ICD-10-CM | POA: Diagnosis not present

## 2020-03-04 ENCOUNTER — Ambulatory Visit (INDEPENDENT_AMBULATORY_CARE_PROVIDER_SITE_OTHER): Payer: PPO | Admitting: Neurology

## 2020-03-04 DIAGNOSIS — Z9189 Other specified personal risk factors, not elsewhere classified: Secondary | ICD-10-CM

## 2020-03-04 DIAGNOSIS — I495 Sick sinus syndrome: Secondary | ICD-10-CM

## 2020-03-04 DIAGNOSIS — G4733 Obstructive sleep apnea (adult) (pediatric): Secondary | ICD-10-CM | POA: Diagnosis not present

## 2020-03-04 DIAGNOSIS — E662 Morbid (severe) obesity with alveolar hypoventilation: Secondary | ICD-10-CM

## 2020-03-04 DIAGNOSIS — G4734 Idiopathic sleep related nonobstructive alveolar hypoventilation: Secondary | ICD-10-CM

## 2020-03-04 DIAGNOSIS — R0609 Other forms of dyspnea: Secondary | ICD-10-CM

## 2020-03-04 DIAGNOSIS — F3181 Bipolar II disorder: Secondary | ICD-10-CM

## 2020-03-04 DIAGNOSIS — I5032 Chronic diastolic (congestive) heart failure: Secondary | ICD-10-CM

## 2020-03-06 ENCOUNTER — Ambulatory Visit: Payer: PPO | Admitting: Internal Medicine

## 2020-03-06 ENCOUNTER — Encounter: Payer: Self-pay | Admitting: Internal Medicine

## 2020-03-06 ENCOUNTER — Telehealth: Payer: Self-pay | Admitting: Internal Medicine

## 2020-03-06 ENCOUNTER — Other Ambulatory Visit: Payer: Self-pay

## 2020-03-06 DIAGNOSIS — R0609 Other forms of dyspnea: Secondary | ICD-10-CM

## 2020-03-06 DIAGNOSIS — I10 Essential (primary) hypertension: Secondary | ICD-10-CM | POA: Diagnosis not present

## 2020-03-06 DIAGNOSIS — R0600 Dyspnea, unspecified: Secondary | ICD-10-CM | POA: Diagnosis not present

## 2020-03-06 MED ORDER — FAMOTIDINE 20 MG PO TABS: ORAL_TABLET | ORAL | 11 refills | Status: DC

## 2020-03-06 MED ORDER — PANTOPRAZOLE SODIUM 40 MG PO TBEC: 40.0000 mg | DELAYED_RELEASE_TABLET | Freq: Every day | ORAL | 2 refills | Status: DC

## 2020-03-06 MED ORDER — TELMISARTAN 40 MG PO TABS: ORAL_TABLET | ORAL | 11 refills | Status: DC

## 2020-03-06 NOTE — Assessment & Plan Note (Signed)
Body mass index is 47.2 kg/m.  -   Lab Results  Component Value Date   TSH 1.580 01/15/2020     Contributing to gerd risk/ doe/reviewed the need and the process to achieve and maintain neg calorie balance > defer f/u primary care including intermittently monitoring thyroid status

## 2020-03-06 NOTE — Telephone Encounter (Signed)
LMTCB for the pt 

## 2020-03-06 NOTE — Assessment & Plan Note (Signed)
Onset summer of 2020 assoc with atypical cp/ wheezing - RHC/LHC 01/01/20 with nl pressures and nl coronaries  - placed on ACEi 2016 and again 01/21/20 d/c'd 03/06/2020  And rx for gerd started   Symptoms are markedly disproportionate to objective findings and not clear to what extent this is actually a pulmonary  problem but pt does appear to have difficult to sort out respiratory symptoms of unknown origin for which  DDX  = almost all start with A and  include Adherence, Ace Inhibitors, Acid Reflux, Active Sinus Disease, Alpha 1 Antitripsin deficiency, Anxiety masquerading as Airways dz,  ABPA,  Allergy(esp in young), Aspiration (esp in elderly), Adverse effects of meds,  Active smoking or Vaping, A bunch of PE's/clot burden (a few small clots can't cause this syndrome unless there is already severe underlying pulm or vascular dz with poor reserve),  Anemia or thyroid disorder, plus two Bs  = Bronchiectasis and Beta blocker use..and one C= CHF     Adherence is always the initial "prime suspect" and is a multilayered concern that requires a "trust but verify" approach in every patient - starting with knowing how to use medications, especially inhalers, correctly, keeping up with refills and understanding the fundamental difference between maintenance and prns vs those medications only taken for a very short course and then stopped and not refilled.  - return with all meds in hand using a trust but verify approach to confirm accurate Medication  Reconciliation The principal here is that until we are certain that the  patients are doing what we've asked, it makes no sense to ask them to do more.   ? Acid (or non-acid) GERD > always difficult to exclude as up to 75% of pts in some series report no assoc GI/ Heartburn symptoms and she does have atypical cp's she very difficult time describing  > rec max (24h)  acid suppression and diet restrictions/ reviewed and instructions given in writing.   ? acei effects:   ACE inhibitors are problematic in  pts with non specific chest and airway complaints because  even experienced pulmonologists can't always distinguish ace effects from copd/asthma.  By themselves they don't actually cause a problem, much like oxygen can't by itself start a fire, but they certainly serve as a powerful catalyst or enhancer for any "fire"  or inflammatory process in the upper airway, be it caused by an ET  tube or more commonly reflux (especially in the obese or pts with known GERD or who are on biphoshonates).   ? Anxiet/ depression/ deconditioning  > usually at the bottom of this list of usual suspects but should be much higher on this pt's based on H and P and note already on psychotropics and may interfere with adherence and also interpretation of response or lack thereof to symptom management which can be quite subjective.   Anemia/ thyroid dz ruled out   Lab Results  Component Value Date   HGB 13.4 01/02/2020   HGB 14.3 01/01/2020   HGB 14.6 01/01/2020   HGB 13.3 08/31/2017   HGB 13.3 07/22/2011   HGB 13.4 07/21/2010   HGB 13.7 12/02/2009    Lab Results  Component Value Date   TSH 1.580 01/15/2020     ? A bunch of pe's > D dimer nl - while a normal  or high normal value (seen commonly in the elderly or chronically ill)  may miss small peripheral pe, the clot burden with sob is moderately high and the  d dimer  has a very high neg pred value if used in this setting.    ? chf > could have an element of cardiac asthma that was corrected by diuresis prior to [facility] R[facility] but she says she only feels a smidge better so doubt chf/ihd

## 2020-03-06 NOTE — Progress Notes (Signed)
[name], female    DOB: 1951/03/15,    MRN: [id number]   Brief patient profile:  69 yowf never smoker healthy child/ adult and healthy IUP x 3 last at age 69 with baseline wt 180 p IUP with new onset summer 2020 noticed a change in singing voice and wheezing and doe x inclines and noted problem with mildew sob/ worse wheeze and resolved but seen next day at [facility] > albuterol but never used and referred to pulmonary clinic in [place]  03/06/2020 by Dr  [name] s/p complete cards w/u to include RHC/LHC 01/01/20 with nl pressures and nl coronaries      History of Present Illness  03/06/2020  Pulmonary/ 1st office eval/[name]  On lisinopril since 01/21/20  And wt 275 at initial eval  Chief Complaint  Patient presents with  . Pulmonary Consult    Referred by Dr. Wolfgang [name]. Pt c/o DOE and wheezing since May 2021. She states gets winded walking up even a slight incline.   Dyspnea:  [employer] shopping  difficult better since last admit  Cough: noisy breathing with urge to cough if take breath in worse x weeks      Sleep: lie flat cpap per [facility] SABA use: has not used inhaler   No obvious day to day or daytime variability or assoc excess/ purulent sputum or mucus plugs or hemoptysis or cp or chest tightness, subjective wheeze or overt sinus or hb symptoms.   Sleeping as above without nocturnal  or early am exacerbation  of respiratory  c/o's or need for noct saba. Also denies any obvious fluctuation of symptoms with weather or environmental changes or other aggravating or alleviating factors except as outlined above   No unusual exposure hx or h/o childhood pna/ asthma or knowledge of premature birth.  Current Allergies, Complete Past Medical History, Past Surgical History, Family History, and Social History were reviewed in [employer] record.  ROS  The following are not active complaints unless bolded Hoarseness, sore throat, dysphagia, dental problems, itching,  sneezing,  nasal congestion or discharge of excess mucus or purulent secretions, ear ache,   fever, chills, sweats, unintended wt loss or wt gain, classically pleuritic or exertional cp,  orthopnea pnd or arm/hand swelling  or leg swelling, presyncope, palpitations, abdominal pain, anorexia, nausea, vomiting, diarrhea  or change in bowel habits or change in bladder habits, change in stools or change in urine, dysuria, hematuria,  rash, arthralgias, visual complaints, headache, numbness, weakness or ataxia or problems with walking or coordination,  change in mood or  memory.            Past Medical History:  Diagnosis Date  . Anxiety   . Bipolar 1 disorder ([place])   . 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 Breast cancer, left breast ([place]) 07/22/2011  . Cancer ([place])   . CHF (congestive heart failure) ([place])   . Depression   . High cholesterol   . Hypertension   . IBS (irritable bowel syndrome)   . Impaired fasting glucose   . Memory loss   . OA (osteoarthritis)   . Obesity   . Personal history of chemotherapy 2009  . Personal history of radiation therapy 2009  . Polycystic ovarian disease   . Prediabetes 02/10/2020  . 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 Sleep apnea     Outpatient Medications Prior to Visit  Medication Sig Dispense Refill  . acetaminophen (TYLENOL 8  HOUR) 650 MG CR tablet Take 650-1,300 mg by mouth See admin instructions. 1300MG  IN THE MORNING AND 650MG  AT BEDTIME    . albuterol (VENTOLIN HFA) 108 (90 Base) MCG/ACT inhaler Inhale 1-2 puffs into the lungs every 6 (six) hours as needed for wheezing or shortness of breath. 18 g 0  . ALPRAZolam (XANAX XR) 1 MG 24 hr tablet Take 1 mg by mouth every morning.     [name] ALPRAZolam (XANAX) 1 MG tablet Take 0.5 mg by mouth daily as needed for anxiety.     . calcium carbonate (TUMS - DOSED IN MG ELEMENTAL CALCIUM) 500 MG chewable tablet Chew 1-3 tablets by mouth daily as needed for  indigestion or heartburn.    . furosemide (LASIX) 20 MG tablet Take 20 mg by mouth daily.     [name] glucosamine-chondroitin 500-400 MG tablet Take 2 tablets by mouth every morning.     . lamoTRIgine (LAMICTAL) 200 MG tablet Take 400 mg by mouth at bedtime.     [name] lisinopril (ZESTRIL) 5 MG tablet Take 1 tablet (5 mg total) by mouth daily. 90 tablet 3  . Multiple Vitamin (MULTIVITAMIN) tablet Take 1 tablet by mouth daily.    . QUEtiapine (SEROQUEL) 200 MG tablet Take 500 mg by mouth at bedtime.     . Vitamin D, Ergocalciferol, (DRISDOL) 50000 units CAPS capsule Take 50,000 Units by mouth every Sunday.      No facility-administered medications prior to visit.     Objective:     BP 108/68 (BP Location: Left Arm, Cuff Size: Large)   Pulse 60   Temp 97.7 F (36.5 C)   Ht 5\' 4"  (1.626 m)   Wt 275 lb (124.7 kg)   SpO2 96% Comment: on RA  BMI 47.20 kg/m   SpO2: 96 % (on RA)   Obese amb somber wf nad   HEENT : pt wearing mask not removed for exam due to covid -19 concerns.    NECK :  without JVD/Nodes/TM/ nl carotid upstrokes bilaterally   LUNGS: no acc muscle use,  Nl contour chest which is clear to A and P bilaterally without cough on insp or exp maneuvers   CV:  RRR  no s3 or murmur or increase in P2, and no edema   ABD:  soft and nontender with nl inspiratory excursion in the supine position. No bruits or organomegaly appreciated, bowel sounds nl  MS:  Nl gait/ ext warm without deformities, calf tenderness, cyanosis or clubbing No obvious joint restrictions   SKIN: warm and dry without lesions    NEURO:  alert, approp, nl sensorium with  no motor or cerebellar deficits apparent.     I personally reviewed images and agree with radiology impression as follows:  CXR:   Pa and lat 02/28/20 No active cardiopulmonary disease.   Labs   reviewed:      Chemistry      Component Value Date/Time   NA 138 02/06/2020 0859   NA 142 12/21/2018 0810   K 4.2 02/06/2020 0859   CL  101 02/06/2020 0859   CO2 27 02/06/2020 0859   BUN 21 02/06/2020 0859   BUN 20 12/21/2018 0810   CREATININE 0.85 02/06/2020 0859   CREATININE 0.80 12/30/2015 1138      Component Value Date/Time   CALCIUM 9.3 02/06/2020 0859   ALKPHOS 87 12/31/2019 1542   AST 27 12/31/2019 1542   ALT 25 12/31/2019 1542   BILITOT 0.6 12/31/2019 1542   BILITOT 0.5  12/21/2018 0810        Lab Results  Component Value Date   WBC 6.2 01/02/2020   HGB 13.4 01/02/2020   HCT 41.5 01/02/2020   MCV 96.1 01/02/2020   PLT 248 01/02/2020       EOS                                                               0.1                                    12/04/19   Lab Results  Component Value Date   DDIMER 0.49 12/05/2019      Lab Results  Component Value Date   TSH 1.580 01/15/2020      BNP    46   12/04/19                Assessment   DOE (dyspnea on exertion) Onset summer of 2020 assoc with atypical cp/ wheezing - RHC/LHC 01/01/20 with nl pressures and nl coronaries  - placed on ACEi 2016 and again 01/21/20 d/c'd 03/06/2020  And rx for gerd started   Symptoms are markedly disproportionate to objective findings and not clear to what extent this is actually a pulmonary  problem but pt does appear to have difficult to sort out respiratory symptoms of unknown origin for which  DDX  = almost all start with A and  include Adherence, Ace Inhibitors, Acid Reflux, Active Sinus Disease, Alpha 1 Antitripsin deficiency, Anxiety masquerading as Airways dz,  ABPA,  Allergy(esp in young), Aspiration (esp in elderly), Adverse effects of meds,  Active smoking or Vaping, A bunch of PE's/clot burden (a few small clots can't cause this syndrome unless there is already severe underlying pulm or vascular dz with poor reserve),  Anemia or thyroid disorder, plus two Bs  = Bronchiectasis and Beta blocker use..and one C= CHF     Adherence is always the initial "prime suspect" and is a multilayered concern that requires a "trust  but verify" approach in every patient - starting with knowing how to use medications, especially inhalers, correctly, keeping up with refills and understanding the fundamental difference between maintenance and prns vs those medications only taken for a very short course and then stopped and not refilled.  - return with all meds in hand using a trust but verify approach to confirm accurate Medication  Reconciliation The principal here is that until we are certain that the  patients are doing what we've asked, it makes no sense to ask them to do more.   ? Acid (or non-acid) GERD > always difficult to exclude as up to 75% of pts in some series report no assoc GI/ Heartburn symptoms and she does have atypical cp's she very difficult time describing  > rec max (24h)  acid suppression and diet restrictions/ reviewed and instructions given in writing.   ? acei effects:  ACE inhibitors are problematic in  pts with non specific chest and airway complaints because  even experienced pulmonologists can't always distinguish ace effects from copd/asthma.  By themselves they don't actually cause a problem, much like oxygen can't by itself start a fire, but they certainly serve  as a powerful catalyst or enhancer for any "fire"  or inflammatory process in the upper airway, be it caused by an ET  tube or more commonly reflux (especially in the obese or pts with known GERD or who are on biphoshonates).   ? Anxiet/ depression/ deconditioning  > usually at the bottom of this list of usual suspects but should be much higher on this pt's based on H and P and note already on psychotropics and may interfere with adherence and also interpretation of response or lack thereof to symptom management which can be quite subjective.   Anemia/ thyroid dz ruled out   Lab Results  Component Value Date   HGB 13.4 01/02/2020   HGB 14.3 01/01/2020   HGB 14.6 01/01/2020   HGB 13.3 08/31/2017   HGB 13.3 07/22/2011   HGB 13.4 07/21/2010    HGB 13.7 12/02/2009    Lab Results  Component Value Date   TSH 1.580 01/15/2020     ? A bunch of pe's > D dimer nl - while a normal  or high normal value (seen commonly in the elderly or chronically ill)  may miss small peripheral pe, the clot burden with sob is moderately high and the d dimer  has a very high neg pred value if used in this setting.    ? chf > could have an element of cardiac asthma that was corrected by diuresis prior to [place] but she says she only feels a smidge better so doubt chf/ihd      Essential hypertension Try off acei 03/06/2020   In the best review of chronic cough to date ( NEJM 2016 375 2585-2778) ,  ACEi are now felt to cause cough in up to  20% of pts which is a 4 fold increase from previous reports and does not include the variety of non-specific complaints we see in pulmonary clinic in pts on ACEi but previously attributed to another dx like  Copd/asthma and  include PNDS, throat and chest congestion, "bronchitis", unexplained dyspnea and noct "strangling" sensations, and hoarseness/ loss of singing voice, but also  atypical /refractory GERD symptoms like dysphagia and "bad heartburn"   The only way I know ACEi not contributing to ongoing symptoms =  minimum of 6 weeks then regroup.    >>> try micardis 40 mg daily and regroup in 6 weeks, call sooner if needed     Morbid obesity ([place]) Body mass index is 47.2 kg/m.  -   Lab Results  Component Value Date   TSH 1.580 01/15/2020     Contributing to gerd risk/ doe/reviewed the need and the process to achieve and maintain neg calorie balance > defer f/u primary care including intermittently monitoring thyroid status        Each maintenance medication was reviewed in detail including emphasizing most importantly the difference between maintenance and prns and under what circumstances the prns are to be triggered using an action plan format where appropriate.  Total time for H and P, chart review,  counseling,  and generating customized AVS unique to this office visit / charting = 60 min            Christinia Gully, MD 03/06/2020

## 2020-03-06 NOTE — Patient Instructions (Addendum)
Stop lisinopril   Start telmasartan 40 mg one daily - break in half if too strong   Pantoprazole (protonix) 40 mg   Take  30-60 min before first meal of the day and Pepcid (famotidine)  20 mg one after supper  until return to office - this is the best way to tell whether stomach acid is contributing to your problem.     GERD (REFLUX)  is an extremely common cause of respiratory symptoms just like yours , many times with no obvious heartburn at all.    It can be treated with medication, but also with lifestyle changes including elevation of the head of your bed (ideally with 6 -8inch blocks under the headboard of your bed),  Smoking cessation, avoidance of late meals, excessive alcohol, and avoid fatty foods, chocolate, peppermint, colas, red wine, and acidic juices such as orange juice.  NO MINT OR MENTHOL PRODUCTS SO NO COUGH DROPS  USE SUGARLESS CANDY INSTEAD (Jolley ranchers or Stover's or Life Savers) or even ice chips will also do - the key is to swallow to prevent all throat clearing. NO OIL BASED VITAMINS - use powdered substitutes.  Avoid fish oil when coughing.     Please schedule a follow up office visit in 6 weeks, call sooner if needed

## 2020-03-06 NOTE — Assessment & Plan Note (Signed)
Try off acei 03/06/2020   In the best review of chronic cough to date ( NEJM 2016 375 9735-3299) ,  ACEi are now felt to cause cough in up to  20% of pts which is a 4 fold increase from previous reports and does not include the variety of non-specific complaints we see in pulmonary clinic in pts on ACEi but previously attributed to another dx like  Copd/asthma and  include PNDS, throat and chest congestion, "bronchitis", unexplained dyspnea and noct "strangling" sensations, and hoarseness/ loss of singing voice, but also  atypical /refractory GERD symptoms like dysphagia and "bad heartburn"   The only way I know ACEi not contributing to ongoing symptoms =  minimum of 6 weeks then regroup.    >>> try micardis 40 mg daily and regroup in 6 weeks, call sooner if needed         Each maintenance medication was reviewed in detail including emphasizing most importantly the difference between maintenance and prns and under what circumstances the prns are to be triggered using an action plan format where appropriate.  Total time for H and P, chart review, counseling,  and generating customized AVS unique to this office visit / charting = 60 min

## 2020-03-10 DIAGNOSIS — H25811 Combined forms of age-related cataract, right eye: Secondary | ICD-10-CM | POA: Diagnosis not present

## 2020-03-10 DIAGNOSIS — H25813 Combined forms of age-related cataract, bilateral: Secondary | ICD-10-CM | POA: Diagnosis not present

## 2020-03-10 DIAGNOSIS — H524 Presbyopia: Secondary | ICD-10-CM | POA: Diagnosis not present

## 2020-03-11 NOTE — Telephone Encounter (Signed)
Pt returned a phone call. pt can be reached [phone]

## 2020-03-11 NOTE — Telephone Encounter (Signed)
lmtcb for pt.  

## 2020-03-11 NOTE — Telephone Encounter (Signed)
Spoke with pt, states she wanted to make MW aware that she has lost 8lb in the past 2 mos, and that she is planning on starting her cpap soon.    Forwarding to MW as Juluis Rainier.

## 2020-03-14 ENCOUNTER — Encounter: Payer: Self-pay | Admitting: Internal Medicine

## 2020-03-14 ENCOUNTER — Other Ambulatory Visit: Payer: Self-pay

## 2020-03-14 ENCOUNTER — Ambulatory Visit: Payer: PPO | Admitting: Internal Medicine

## 2020-03-14 VITALS — BP 128/74 | HR 63 | Ht 64.0 in | Wt 273.0 lb

## 2020-03-14 DIAGNOSIS — I495 Sick sinus syndrome: Secondary | ICD-10-CM | POA: Diagnosis not present

## 2020-03-14 DIAGNOSIS — I5032 Chronic diastolic (congestive) heart failure: Secondary | ICD-10-CM | POA: Diagnosis not present

## 2020-03-14 NOTE — Patient Instructions (Signed)

## 2020-03-14 NOTE — Progress Notes (Signed)
HPI [name] returns today for follow-up.  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  She has had occasional palpitations but for the most part her symptoms have been controlled.  She has class II dyspnea which is multifactorial.  She is pending a CPAP as she has been found to have sleep apnea.  She denies chest pain.  She has not had syncope. Allergies  Allergen Reactions  . Imipramine Hives and Rash  . Tricor [Fenofibrate] Other (See Comments)    Pain, "aching"     Current Outpatient Medications  Medication Sig Dispense Refill  . acetaminophen (TYLENOL 8 HOUR) 650 MG CR tablet Take 650-1,300 mg by mouth See admin instructions. 1300MG  IN THE MORNING AND 650MG  AT BEDTIME    . albuterol (VENTOLIN HFA) 108 (90 Base) MCG/ACT inhaler Inhale 1-2 puffs into the lungs every 6 (six) hours as needed for wheezing or shortness of breath. 18 g 0  . ALPRAZolam (XANAX XR) 1 MG 24 hr tablet Take 1 mg by mouth every morning.     [name] ALPRAZolam (XANAX) 1 MG tablet Take 0.5 mg by mouth daily as needed for anxiety.     . calcium carbonate (TUMS - DOSED IN MG ELEMENTAL CALCIUM) 500 MG chewable tablet Chew 1-3 tablets by mouth daily as needed for indigestion or heartburn.    . furosemide (LASIX) 20 MG tablet Take 20 mg by mouth daily as needed for fluid or edema.     [name] glucosamine-chondroitin 500-400 MG tablet Take 2 tablets by mouth every morning.     . lamoTRIgine (LAMICTAL) 200 MG tablet Take 400 mg by mouth at bedtime.     [name] lisinopril (ZESTRIL) 5 MG tablet Take 1 tablet by mouth daily as needed. When BP is elevated    . Multiple Vitamin (MULTIVITAMIN) tablet Take 1 tablet by mouth daily.    . QUEtiapine (SEROQUEL) 200 MG tablet Take 500 mg by mouth at bedtime.     . Vitamin D, Ergocalciferol, (DRISDOL) 50000 units CAPS capsule  Take 50,000 Units by mouth every Sunday.      No current facility-administered medications for this visit.     Past Medical History:  Diagnosis Date  . Anxiety   . Bipolar 1 disorder ([place])   . 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 Breast cancer, left breast ([place]) 07/22/2011  . Cancer ([place])   . CHF (congestive heart failure) ([place])   . Depression   . High cholesterol   . Hypertension   . IBS (irritable bowel syndrome)   . Impaired fasting glucose   . Memory loss   . OA (osteoarthritis)   . Obesity   . Personal history of chemotherapy 2009  . Personal history of radiation therapy 2009  . Polycystic ovarian disease   . Prediabetes 02/10/2020  . 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 Sleep apnea     ROS:   All systems reviewed and negative except as noted in the HPI.   Past Surgical History:  Procedure Laterality Date  . ABDOMINAL HYSTERECTOMY    . APPENDECTOMY    . BREAST BIOPSY Left 2011  . BREAST BIOPSY  2008  . BREAST LUMPECTOMY Left 2008  . CHOLECYSTECTOMY    . COLONOSCOPY N/A 09/13/2013   Procedure: COLONOSCOPY;  Surgeon: [name], MD;  [facility];  Service: Endoscopy;  Laterality: N/A;  730  . EP IMPLANTABLE DEVICE N/A 01/06/2016   Procedure: Pacemaker Implant;  Surgeon: [name], MD;  Location: [facility];  Service: Cardiovascular;  Laterality: N/A;  . ESOPHAGOGASTRODUODENOSCOPY N/A 01/17/2014   Procedure: ESOPHAGOGASTRODUODENOSCOPY (EGD);  Surgeon: [name], MD;  Location: [facility];  Service: Endoscopy;  Laterality: N/A;  245  . RIGHT/LEFT HEART CATH AND CORONARY ANGIOGRAPHY N/A 01/01/2020   Procedure: RIGHT/LEFT HEART CATH AND CORONARY ANGIOGRAPHY;  Surgeon: [name], MD;  Location: [facility];  Service: Cardiovascular;  Laterality: N/A;  . TOTAL KNEE ARTHROPLASTY Bilateral right knee   2012, 2007     Family History  Problem Relation Age of  Onset  . Bipolar disorder Mother   . Diabetes Mother   . Bipolar disorder Sister   . Diabetes Sister   . Alcohol abuse Father   . Hypertension Father   . Heart disease Other      Social History   Socioeconomic History  . Marital status: Married    Spouse name: Not on file  . Number of children: 3  . Years of education: college  . Highest education level: Associate degree: occupational, Hotel manager, or vocational program  Occupational History  . Occupation: Therapist, sports  Tobacco Use  . Smoking status: Never Smoker  . Smokeless tobacco: Never Used  Vaping Use  . Vaping Use: Never used  Substance and Sexual Activity  . Alcohol use: No    Alcohol/week: 0.0 standard drinks  . Drug use: No  . Sexual activity: Never  Other Topics Concern  . Not on file  Social History Narrative   04/24/2013 [name] was born and grew up in [place]. She reports that her childhood was "tough." She has 2 older sisters and a younger brother. She achieved an Associates Degree in nursing. 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 She has 2 daughters and one son. She denies any legal difficulties. She affiliates as Engineer, manufacturing. Her hobbies include reading, sewing, crafts, and cooking. She reports that her social support system consists of her friend and passed her. 04/24/2013 [name]      Lives alone.   Right-handed.   No caffeine use.   Social Determinants of Health   Financial Resource Strain:   . Difficulty of Paying Living Expenses: Not on file  Food Insecurity:   . Worried About Charity fundraiser in the Last Year: Not on file  . Ran Out of Food in the Last Year: Not on file  Transportation Needs:   . Lack of Transportation (Medical): Not on file  . Lack of Transportation (Non-Medical): Not on file  Physical Activity:   . Days of Exercise per Week: Not on file  . Minutes of Exercise per Session: Not on file  Stress:   . Feeling of Stress : Not on  file  Social Connections:   . Frequency of Communication with Friends and Family: Not on file  . Frequency of Social Gatherings with Friends and Family: Not on file  . Attends Religious Services: Not on file  . Active Member of Clubs or Organizations: Not on file  . Attends Archivist Meetings: Not on file  . Marital Status: Not on file  Intimate Partner Violence:   . Fear of Current or Ex-Partner: Not on file  . Emotionally Abused: Not on  file  . Physically Abused: Not on file  . Sexually Abused: Not on file     BP 128/74   Pulse 63   Ht 5\' 4"  (1.626 m)   Wt 273 lb (123.8 kg)   SpO2 94%   BMI 46.86 kg/m   Physical Exam:  Well appearing NAD HEENT: Unremarkable Neck:  No JVD, no thyromegally Lymphatics:  No adenopathy Back:  No CVA tenderness Lungs:  Clear with no wheezes HEART:  Regular rate rhythm, no murmurs, no rubs, no clicks Abd:  soft, positive bowel sounds, no organomegally, no rebound, no guarding Ext:  2 plus pulses, no edema, no cyanosis, no clubbing Skin:  No rashes no nodules Neuro:  CN II through XII intact, motor grossly intact   DEVICE  Normal device function.  See PaceArt for details.   Assess/Plan: 1. Obesity - she has lost 8lbs. She is encouraged to eat less and increase her activity. 2. Diastolic heart failure - I encouraged her to avoid salty foods and to lose weight. 3. Sinus node dysfunction - she is asymptomatic, s/p PPM insertion. 4. PPM - her Biotronik DDD PM is working normally.  Andrea Overlie Yusuf Yu,MD

## 2020-03-20 ENCOUNTER — Telehealth: Payer: Self-pay | Admitting: Neurology

## 2020-03-20 NOTE — Telephone Encounter (Signed)
I called [name]. I advised [name] that Dr. Brett Fairy reviewed their sleep study results and found that [name] was best treated with CPAP at pressure of 9 cm. Dr. Brett Fairy recommends that [name] starts auto CPAP. I reviewed PAP compliance expectations with the [name]. [name] is agreeable to starting a CPAP. I advised [name] that an order will be sent to a DME, [facility] and [facility] will call the [name] within about one week after they file with the [name]'s insurance. [facility]) will show the [name] how to use the machine, fit for masks, and troubleshoot the CPAP if needed. A follow up appt was made for insurance purposes with Ward Givens, NP on 06/19/2020 at 10:30 am. [name] verbalized understanding to arrive 15 minutes early and bring their CPAP. A letter with all of this information in it will be mailed to the [name] as a reminder. I verified with the [name] that the address we have on file is correct. [name] verbalized understanding of results. [name] had no questions at this time but was encouraged to call back if questions arise. I have sent the order to [facility]) and have received confirmation that they have received the order.

## 2020-03-20 NOTE — Addendum Note (Signed)
Addended by: Larey Seat on: 03/20/2020 01:07 PM   Modules accepted: Orders

## 2020-03-20 NOTE — Procedures (Signed)
PATIENT'S NAME:  [name], [name] DOB:      06-06-1951      MR#:    [id number]     DATE OF RECORDING: 03/04/2020 REFERRING M.D.:  [name], MD Study Performed:   CPAP  Titration HISTORY:  Pt with CHF history who is returning to [facility] at [facility] for PAP titration study following PSG from 01/27/20 with an AHI of 13.7/h , REM AHI of 38.8/h and total desaturation time of 125 minutes, and Spo2 nadir of 79%.  The patient endorsed the Epworth Sleepiness Scale at 16 points.   The patient's weight 282 pounds with a height of 64 (inches), resulting in a BMI of 48.2 kg/m2. The patient's neck circumference measured 18 inches.  CURRENT MEDICATIONS: Tylenol, Xanax, Norvasc, Lasix, Lamictal, Seroquel   PROCEDURE:  This is a multichannel digital polysomnogram utilizing the SomnoStar 11.2 system.  Electrodes and sensors were applied and monitored per AASM Specifications.   EEG, EOG, Chin and Limb EMG, were sampled at 200 Hz.  ECG, Snore and Nasal Pressure, Thermal Airflow, Respiratory Effort, CPAP Flow and Pressure, Oximetry was sampled at 50 Hz. Digital video and audio were recorded.      CPAP was initiated at 5 cmH20 with heated humidity per AASM split night standards and pressure was advanced to 9 cmH20 because of hypopneas, apneas and desaturations.  At a PAP pressure of 9 cmH20, there was a reduction of the AHI to 0.3/h with improvement of sleep apnea. Mask used ResMed AirFit N 30 in [employer] was at 22:41 and Lights On at 05:18. Total recording time (TRT) was 398 minutes, with a total sleep time (TST) of 326.5 minutes. The patient's sleep latency was 28.5 minutes. REM latency was 49.5 minutes.  The sleep efficiency was 82.3 %.    SLEEP ARCHITECTURE: WASO (Wake after sleep onset) was 41 minutes.  There were 14.5 minutes in Stage N1, 58.5 minutes Stage N2, 121 minutes Stage N3 and 132.5 minutes in Stage REM.  The percentage of Stage N1 was 4.4%, Stage N2 was 17.9%, Stage N3 was 37.1% and Stage R  (REM sleep) was 40.6%.   RESPIRATORY ANALYSIS:  There was a total of 1 respiratory events: 0 obstructive apneas, 0 central apneas and 0 mixed apneas with a total of 0 apneas and an apnea index (AI) of 0 /hour. There were 1 hypopneas with a hypopnea index of .2/hour.   The total APNEA/HYPOPNEA INDEX  (AHI) was 0.2 /hour . 0 events occurred in REM sleep and 1 events in NREM. The REM AHI was 0.0 /hour versus a non-REM AHI of 0.3 /hour.  The patient spent 43.5 minutes of total sleep time in the supine position and 283 minutes in non-supine. The supine AHI was 1.4/h, versus a non-supine AHI of 0.0/h.  OXYGEN SATURATION & C02:  The baseline 02 saturation was 92%, with the lowest being 85%. Time spent below 89% saturation equaled 4 minutes.  The arousals were noted as: 29 were spontaneous, 0 were associated with PLMs, 1 was associated with respiratory events and the PLM Arousal was 0 /hour. Audio and video analysis did not show any abnormal or unusual movements, behaviors, phonations or vocalizations.   The patient took bathroom breaks. EKG was in keeping with normal sinus rhythm (NSR).  DIAGNOSIS Obstructive Sleep Apnea and Sleep Related Hypoxemia were alleviated under CPAP pressure of 9 cm water and using a ResMed AirFit N30 nasal (small sized) mask.   PLANS/RECOMMENDATIONS: The patient was fitted with  a ResMed AirFit N30 small mask.   Auto CPAP ordered for setting from 6-12 cm water, 2 cm EPR and above named interface, under heated humidification.  1. Any apnea patient should avoid sedatives, hypnotics, and alcohol consumption at bedtime. 2. CPAP therapy compliance is defined as 4 hours or more of nightly use.   DISCUSSION: A follow up appointment will be scheduled in the Sleep Clinic at [facility].   Please call [phone] with any questions.      I certify that I have reviewed the entire raw data recording prior to the issuance of this report in accordance with the  Standards of Accreditation of the American Academy of Sleep Medicine (AASM)    Larey Seat, M.D. Diplomat, Tax adviser of Psychiatry and Neurology  Diplomat, Tax adviser of Sleep Medicine Market researcher, [employer] [facility] at [employer]

## 2020-03-20 NOTE — Telephone Encounter (Signed)
-----   Message from Larey Seat, MD sent at 03/20/2020  1:07 PM EDT ----- DIAGNOSIS Obstructive Sleep Apnea and Sleep Related Hypoxemia were alleviated under CPAP pressure of 9 cm water and using a ResMed AirFit N30 nasal (small sized) mask.   PLANS/RECOMMENDATIONS: The patient was fitted with a ResMed AirFit N30 small mask.   Auto CPAP ordered for setting from 6-12 cm water, 2 cm EPR and above named interface, under heated humidification.  1. Any apnea patient should avoid sedatives, hypnotics, and alcohol consumption at bedtime. 2. CPAP therapy compliance is defined as 4 hours or more of nightly use.

## 2020-03-20 NOTE — Progress Notes (Signed)
DIAGNOSIS Obstructive Sleep Apnea and Sleep Related Hypoxemia were alleviated under CPAP pressure of 9 cm water and using a ResMed AirFit N30 nasal (small sized) mask.   PLANS/RECOMMENDATIONS: The patient was fitted with a ResMed AirFit N30 small mask.   Auto CPAP ordered for setting from 6-12 cm water, 2 cm EPR and above named interface, under heated humidification.  1. Any apnea patient should avoid sedatives, hypnotics, and alcohol consumption at bedtime. 2. CPAP therapy compliance is defined as 4 hours or more of nightly use.

## 2020-03-21 ENCOUNTER — Other Ambulatory Visit: Payer: Self-pay

## 2020-03-21 ENCOUNTER — Ambulatory Visit (HOSPITAL_COMMUNITY)
Admission: RE | Admit: 2020-03-21 | Discharge: 2020-03-21 | Disposition: A | Discharge status: Home / Self Care | Payer: PPO | Source: Ambulatory Visit | Attending: Family Medicine | Admitting: Family Medicine

## 2020-03-21 DIAGNOSIS — Z780 Asymptomatic menopausal state: Secondary | ICD-10-CM | POA: Insufficient documentation

## 2020-03-21 DIAGNOSIS — M8580 Other specified disorders of bone density and structure, unspecified site: Secondary | ICD-10-CM | POA: Insufficient documentation

## 2020-04-03 ENCOUNTER — Ambulatory Visit (INDEPENDENT_AMBULATORY_CARE_PROVIDER_SITE_OTHER): Payer: PPO | Admitting: Emergency Medicine

## 2020-04-03 DIAGNOSIS — R001 Bradycardia, unspecified: Secondary | ICD-10-CM | POA: Diagnosis not present

## 2020-04-03 LAB — CUP PACEART REMOTE DEVICE CHECK
Battery Remaining Percentage: 70 %
Brady Statistic RA Percent Paced: 98 %
Brady Statistic RV Percent Paced: 2 %
Date Time Interrogation Session: 20210923071534
Implantable Lead Implant Date: 20170627
Implantable Lead Implant Date: 20170627
Implantable Lead Location: 753859
Implantable Lead Location: 753860
Implantable Lead Model: 377
Implantable Lead Model: 377
Implantable Lead Serial Number: 49436227
Implantable Lead Serial Number: 49471106
Implantable Pulse Generator Implant Date: 20170627
Lead Channel Impedance Value: 429 Ohm
Lead Channel Impedance Value: 546 Ohm
Lead Channel Pacing Threshold Amplitude: 0.9 V
Lead Channel Pacing Threshold Amplitude: 1.8 V
Lead Channel Pacing Threshold Pulse Width: 0.4 ms
Lead Channel Pacing Threshold Pulse Width: 0.4 ms
Lead Channel Sensing Intrinsic Amplitude: 1.6 mV
Lead Channel Sensing Intrinsic Amplitude: 9.1 mV
Lead Channel Setting Pacing Amplitude: 2 V
Lead Channel Setting Pacing Amplitude: 2.4 V
Lead Channel Setting Pacing Pulse Width: 0.4 ms
Pulse Gen Model: 394969
Pulse Gen Serial Number: 68786455

## 2020-04-08 NOTE — Progress Notes (Signed)
Remote pacemaker transmission.   

## 2020-04-17 ENCOUNTER — Other Ambulatory Visit: Payer: Self-pay

## 2020-04-17 ENCOUNTER — Encounter: Payer: Self-pay | Admitting: Internal Medicine

## 2020-04-17 ENCOUNTER — Ambulatory Visit: Payer: PPO | Admitting: Internal Medicine

## 2020-04-17 DIAGNOSIS — R0600 Dyspnea, unspecified: Secondary | ICD-10-CM | POA: Diagnosis not present

## 2020-04-17 DIAGNOSIS — I10 Essential (primary) hypertension: Secondary | ICD-10-CM | POA: Diagnosis not present

## 2020-04-17 DIAGNOSIS — R0609 Other forms of dyspnea: Secondary | ICD-10-CM

## 2020-04-17 NOTE — Assessment & Plan Note (Signed)
Onset summer of 2020 assoc with atypical cp/ wheezing - RHC/LHC 01/01/20 with nl pressures and nl coronaries  - placed on ACEi 2016 and again 01/21/20 d/c'd 03/06/2020  And rx for gerd started but never taken  - 04/17/2020 improved to her satisfaction, working out at y now with sustained treadmill and recumbent bike  Clearly improved and no further w/u needed, asked to monitor speed and resistance(or gradient) / duration of ex going forward with cpst an option if ever feels like she is losing ground.

## 2020-04-17 NOTE — Assessment & Plan Note (Signed)
Try off acei 03/06/2020  > cough and sob much improved  By 04/17/2020   Although even in retrospect it may not be clear the ACEi contributed to the pt's symptoms,  Pt clearly improved off them and adding them back at this point or in the future would risk confusion in interpretation of non-specific respiratory symptoms to which this patient is prone  ie  Better not to muddy the waters here.   Adequate control on present rx, reviewed in detail with pt > no change in rx needed    >>> rec continue micardis 40 mg daily pending f/u with PCP

## 2020-04-17 NOTE — Progress Notes (Addendum)
[name], female    DOB: 04-07-1951    MRN: [id number]   Brief patient profile:  69 yowf never smoker healthy child/ adult and healthy IUP x 3 last at age 69 with baseline wt 180 p IUP with new onset summer 2020 noticed a change in singing voice and wheezing and doe x inclines and aggravated with exp to mildew  >> sob/ worse wheeze and resolved when seen next day at [facility] >  rx   albuterol but never used and referred to pulmonary clinic in [place]  03/06/2020 by Dr  [name] s/p complete cards w/u to include RHC/LHC 01/01/20 with nl pressures and nl coronaries.     History of Present Illness  03/06/2020  Pulmonary/ 1st office eval/[name]  On lisinopril since 01/21/20  And wt 275 at initial eval  Chief Complaint  Patient presents with   Pulmonary Consult    Referred by Dr. Wolfgang [name]. Pt c/o DOE and wheezing since May 2021. She states gets winded walking up even a slight incline.   Dyspnea:  [employer] shopping  difficult better since last admit  Cough: noisy breathing with urge to cough if take breath in worse x weeks      Sleep: lie flat cpap per [place] SABA use: has not used inhaler  rec Stop lisinopril  Start telmasartan 40 mg one daily - break in half if too strong Pantoprazole (protonix) 40 mg   Take  30-60 min before first meal of the day and Pepcid (famotidine)  20 mg one after supper  GERD diet    04/17/2020  f/u ov/[facility] office/[name] re: unexplained sob/ wheeze  Did not take acid  Chief Complaint  Patient presents with   Follow-up    Breathing has improved some since the last visit. She has not had to use her albuterol recently.   Dyspnea:  Now doing treadmill 4 days week x 15 min @  ??? Mph/ grade/ also does recumbent x 45 min x ? Speed / distance  Cough: still clearing throat but improved despite no gerd rx/ no candy handy as rec Sleeping:wedge under matress and no cpap until November  SABA use: none  02: none    No obvious day to day or daytime variability or  assoc excess/ purulent sputum or mucus plugs or hemoptysis or cp or chest tightness, subjective wheeze or overt sinus or hb symptoms.  Sleeping  without nocturnal  or early am exacerbation  of respiratory  c/o's or need for noct saba. Also denies any obvious fluctuation of symptoms with weather or environmental changes or other aggravating or alleviating factors except as outlined above   No unusual exposure hx or h/o childhood pna/ asthma or knowledge of premature birth.  Current Allergies, Complete Past Medical History, Past Surgical History, Family History, and Social History were reviewed in [employer] record.  ROS  The following are not active complaints unless bolded Hoarseness, sore throat, dysphagia, dental problems, itching, sneezing,  nasal congestion or discharge of excess mucus or purulent secretions, ear ache,   fever, chills, sweats, unintended wt loss is intentional or wt gain, classically pleuritic or exertional cp,  orthopnea pnd or arm/hand swelling  or leg swelling, presyncope, palpitations, abdominal pain, anorexia, nausea, vomiting, diarrhea  or change in bowel habits or change in bladder habits, change in stools or change in urine, dysuria, hematuria,  rash, arthralgias, visual complaints, headache, numbness, weakness or ataxia or problems with walking or coordination,  change in mood or  memory.        Current Meds  Medication Sig   acetaminophen (TYLENOL 8 HOUR) 650 MG CR tablet Take 650-1,300 mg by mouth See admin instructions. 1300MG  IN THE MORNING AND 650MG  AT BEDTIME   albuterol (VENTOLIN HFA) 108 (90 Base) MCG/ACT inhaler Inhale 1-2 puffs into the lungs every 6 (six) hours as needed for wheezing or shortness of breath.   ALPRAZolam (XANAX XR) 1 MG 24 hr tablet Take 1 mg by mouth every morning.    ALPRAZolam (XANAX) 1 MG tablet Take 0.5 mg by mouth daily as needed for anxiety.    calcium carbonate (TUMS - DOSED IN MG ELEMENTAL CALCIUM) 500 MG  chewable tablet Chew 1-3 tablets by mouth daily as needed for indigestion or heartburn.   furosemide (LASIX) 20 MG tablet Take 20 mg by mouth daily as needed for fluid or edema.    glucosamine-chondroitin 500-400 MG tablet Take 2 tablets by mouth every morning.    lamoTRIgine (LAMICTAL) 200 MG tablet Take 400 mg by mouth at bedtime.    lisinopril (ZESTRIL) 5 MG tablet Take 1 tablet by mouth daily as needed. When BP is elevated   Multiple Vitamin (MULTIVITAMIN) tablet Take 1 tablet by mouth daily.   QUEtiapine (SEROQUEL) 200 MG tablet Take 500 mg by mouth at bedtime.    telmisartan (MICARDIS) 40 MG tablet Take 40 mg by mouth daily.   Vitamin D, Ergocalciferol, (DRISDOL) 50000 units CAPS capsule Take 50,000 Units by mouth every Sunday.            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    NECK :  without JVD/Nodes/TM/ nl carotid upstrokes bilaterally   LUNGS: no acc muscle use,  Nl contour chest which is clear  to A and P bilaterally without cough on insp or exp maneuvers   CV:  RRR  no s3 or murmur or increase in P2, and no edema   ABD:  Obese soft and nontender with nl inspiratory excursion in the supine position. No bruits or organomegaly appreciated, bowel sounds nl  MS:  Nl gait/ ext warm without deformities, calf tenderness, cyanosis or clubbing No obvious joint restrictions   SKIN: warm and dry without lesions    NEURO:  alert, approp, nl sensorium with  no motor or cerebellar deficits apparent.                       Assessment

## 2020-04-17 NOTE — Assessment & Plan Note (Signed)
Body mass index is 46.17 kg/m.  -  trending down/ encourage strongly  Lab Results  Component Value Date   TSH 1.580 01/15/2020     Contributing to gerd risk/ doe/reviewed the need and the process to achieve and maintain neg calorie balance > defer f/u primary care including intermittently monitoring thyroid status            Each maintenance medication was reviewed in detail including emphasizing most importantly the difference between maintenance and prns and under what circumstances the prns are to be triggered using an action plan format where appropriate.  Total time for H and P, chart review, counseling,   and generating customized AVS unique to this summary final f/u office visit / charting  > 30 min

## 2020-04-17 NOTE — Patient Instructions (Signed)
For throat clearing that bothers you I rec 1st try pepcid 20 mg after breakfast and supper x one month then call for referral to ENT   Use hard rock candy to resist the urge to clear your throat    If you are satisfied with your treatment plan,  let your doctor know and he/she can either refill your medications or you can return here when your prescription runs out.     If in any way you are not 100% satisfied,  please tell us.  If 100% better, tell your friends!  Pulmonary follow up is as needed

## 2020-04-30 DIAGNOSIS — H25012 Cortical age-related cataract, left eye: Secondary | ICD-10-CM | POA: Diagnosis not present

## 2020-04-30 DIAGNOSIS — H2512 Age-related nuclear cataract, left eye: Secondary | ICD-10-CM | POA: Diagnosis not present

## 2020-05-07 DIAGNOSIS — H25011 Cortical age-related cataract, right eye: Secondary | ICD-10-CM | POA: Diagnosis not present

## 2020-05-07 DIAGNOSIS — H2511 Age-related nuclear cataract, right eye: Secondary | ICD-10-CM | POA: Diagnosis not present

## 2020-05-09 ENCOUNTER — Telehealth: Payer: Self-pay

## 2020-05-09 NOTE — Telephone Encounter (Signed)
Last labs 02/08/2020: Lipid, Met 7 HgbA1c

## 2020-05-09 NOTE — Telephone Encounter (Signed)
Patient has physical for 12/29 and needing labs done

## 2020-05-11 NOTE — Telephone Encounter (Signed)
Lipid, liver, metabolic 7-prediabetes, hyperlipidemia, high risk meds

## 2020-05-12 ENCOUNTER — Other Ambulatory Visit: Payer: Self-pay | Admitting: *Deleted

## 2020-05-12 DIAGNOSIS — R7303 Prediabetes: Secondary | ICD-10-CM

## 2020-05-12 DIAGNOSIS — E781 Pure hyperglyceridemia: Secondary | ICD-10-CM

## 2020-05-12 DIAGNOSIS — Z79899 Other long term (current) drug therapy: Secondary | ICD-10-CM

## 2020-05-12 NOTE — Telephone Encounter (Signed)
Labs ordered. Lmtc.

## 2020-05-13 NOTE — Telephone Encounter (Signed)
Patient notified and verbalized understanding. 

## 2020-05-14 DIAGNOSIS — G4733 Obstructive sleep apnea (adult) (pediatric): Secondary | ICD-10-CM | POA: Diagnosis not present

## 2020-06-13 DIAGNOSIS — G4733 Obstructive sleep apnea (adult) (pediatric): Secondary | ICD-10-CM | POA: Diagnosis not present

## 2020-06-18 NOTE — Progress Notes (Deleted)
PATIENT: [name] DOB: [birth year]  REASON FOR VISIT: follow up HISTORY FROM: patient  HISTORY OF PRESENT ILLNESS: Today 06/18/20  HISTORY Andrea Massengale Baldwinis a 69 y.o. year old White or Caucasian female patientand was seen here upon a referralon 01/07/2020 from Dr Wolfgang Phoenix, MD and cardiologist Dr. Crissie Sickles and Dr. Domenic Polite, MD  Chiefconcernaccording to patient :  She has been very fatigued short of breath and feels exhausted.  She has bipolar disorder and felt memory was affected has been seen by Dr Krista Blue within the last 3 years    [name] reports that her primary care physician had urged her probably a year ago to go through a sleep evaluation but about doing the last 6 weeks she had cardiovascular evaluations was diagnosed with CHF and have been repeatedly  admitted and observed in the hospital for short-term.  Her cardiologist now urges the sleep study as well.  She has been very fatigued short of breath and feels exhausted.  She has never been a smoker or tobacco user in any form she has a past history of depression anxiety, she had a pacemaker placed 3 years ago, she is a breast cancer survivor left breast, she has a history of hypertension, bilateral knee replacements colonoscopies she underwent a lumpectomy of the left breast and over 10 years ago had a cholecystectomy and hysterectomy.  The diagnosis of CHF was stated on 12-05-19.  She reports that she was fluid overloaded was admitted to [facility] and had an echocardiogram she followed up with the cardiology nurse practitioner [name] on 2 June had a normal stress test on 4 June and on 21 June just last week was evaluated with a normal cardiac cath.  She was hospitalized until 6-23.  Her cardiologist states that she had recent dyspnea due to diastolic heart failure, intermittent wheezing was noted and her chest x-ray had revealed interstitial edema.  Cardiac enzymes were negative the EKG did not show acute  changes.  She was diuresed and her echocardiogram showed a normal ejection fraction with a grade 1 diastolic dysfunction.  Dr. Domenic Polite agreed with outpatient follow-up discharged on low-dose Lasix.  She had seen Crissie Sickles in October 2020 for sinus node dysfunction s/p Biotronik PPM insertion in the year 2018.   Sleeprelevant medical history: Nocturia: may be once ,  Tonsillectomy in childhood, no TBI, no ENT procedures but wisdom teeth removed. [name] history:No other family member on CPAP with OSA, insomnia, and no sleep walkers.  Social history:Patient is retired from Nursing profession and left at age 14 , she  lives in a household with her niece- her husband lives in psychiatric facility-  Family status is married , with 3 adult children, no grandchildren.  Pets are present, a cat.  Tobacco use- never . ETOH use; never ,  Caffeine intake in form of Coffee( 2-3) Soda( none) Tea ( none ) -no energy drinks. Regular exercise in form of walking, [employer] member  Hobbies : gardening     Sleep habits are as follows:The patient's dinner time is between 5 PM. The patient goes to bed at 11 PM and continues to sleep for 8 hours, wakes rarely bathroom breaks,and wakes still dizzy and not refreshed, going back to bed , feeling anxious, fleeing back to bed- and stays there for another 3-4 hours. She may get up at 2 PM.  The preferred sleep position is I on her right side or in supine, with the support  of 1 pillow. Dreams are reportedly rare.  Noon- 2 PM is the usual rise time. The patient wakes up spontaneously.  She reports not feeling refreshed or restored in AM, with symptoms such as dry mouth, dizziness, anxiety, palpitations,   and residual fatigue.  Naps are taken frequently, lasting from 1-4 hours and are more refreshing than nocturnal sleep.    REVIEW OF SYSTEMS: Out of a complete 14 system review of symptoms, the patient complains only of the following symptoms,  and all other reviewed systems are negative.  ALLERGIES: Allergies  Allergen Reactions  . Imipramine Hives and Rash  . Tricor [Fenofibrate] Other (See Comments)    Pain, "aching"    HOME MEDICATIONS: Outpatient Medications Prior to Visit  Medication Sig Dispense Refill  . acetaminophen (TYLENOL 8 HOUR) 650 MG CR tablet Take 650-1,300 mg by mouth See admin instructions. 1300MG  IN THE MORNING AND 650MG  AT BEDTIME    . albuterol (VENTOLIN HFA) 108 (90 Base) MCG/ACT inhaler Inhale 1-2 puffs into the lungs every 6 (six) hours as needed for wheezing or shortness of breath. 18 g 0  . ALPRAZolam (XANAX XR) 1 MG 24 hr tablet Take 1 mg by mouth every morning.     [name] ALPRAZolam (XANAX) 1 MG tablet Take 0.5 mg by mouth daily as needed for anxiety.     . calcium carbonate (TUMS - DOSED IN MG ELEMENTAL CALCIUM) 500 MG chewable tablet Chew 1-3 tablets by mouth daily as needed for indigestion or heartburn.    . furosemide (LASIX) 20 MG tablet Take 20 mg by mouth daily as needed for fluid or edema.     [name] glucosamine-chondroitin 500-400 MG tablet Take 2 tablets by mouth every morning.     . lamoTRIgine (LAMICTAL) 200 MG tablet Take 400 mg by mouth at bedtime.     . Multiple Vitamin (MULTIVITAMIN) tablet Take 1 tablet by mouth daily.    . QUEtiapine (SEROQUEL) 200 MG tablet Take 500 mg by mouth at bedtime.     [name] telmisartan (MICARDIS) 40 MG tablet Take 40 mg by mouth daily.    . Vitamin D, Ergocalciferol, (DRISDOL) 50000 units CAPS capsule Take 50,000 Units by mouth every Sunday.      No facility-administered medications prior to visit.    PAST MEDICAL HISTORY: Past Medical History:  Diagnosis Date  . Anxiety   . Bipolar 1 disorder ([place])   . 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 Breast cancer, left breast ([place]) 07/22/2011  . Cancer ([place])   . CHF (congestive heart failure) ([place])   . Depression   . High cholesterol   . Hypertension   . IBS (irritable bowel  syndrome)   . Impaired fasting glucose   . Memory loss   . OA (osteoarthritis)   . Obesity   . Personal history of chemotherapy 2009  . Personal history of radiation therapy 2009  . Polycystic ovarian disease   . Prediabetes 02/10/2020  . 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 Sleep apnea     PAST SURGICAL HISTORY: Past Surgical History:  Procedure Laterality Date  . ABDOMINAL HYSTERECTOMY    . APPENDECTOMY    . BREAST BIOPSY Left 2011  . BREAST BIOPSY  2008  . BREAST LUMPECTOMY Left 2008  . CHOLECYSTECTOMY    . COLONOSCOPY N/A 09/13/2013   Procedure: COLONOSCOPY;  Surgeon: [name], MD;  [facility];  Service: Endoscopy;  Laterality: N/A;  730  .  EP IMPLANTABLE DEVICE N/A 01/06/2016   Procedure: Pacemaker Implant;  Surgeon: [name], MD;  Location: [place] [facility];  Service: Cardiovascular;  Laterality: N/A;  . ESOPHAGOGASTRODUODENOSCOPY N/A 01/17/2014   Procedure: ESOPHAGOGASTRODUODENOSCOPY (EGD);  Surgeon: [name], MD;  [facility]: [facility];  Service: Endoscopy;  Laterality: N/A;  245  . RIGHT/LEFT HEART CATH AND CORONARY ANGIOGRAPHY N/A 01/01/2020   Procedure: RIGHT/LEFT HEART CATH AND CORONARY ANGIOGRAPHY;  Surgeon: [name], MD;  Location: [place] [facility];  Service: Cardiovascular;  Laterality: N/A;  . TOTAL KNEE ARTHROPLASTY Bilateral right knee   2012, 2007    FAMILY HISTORY: Family History  Problem Relation Age of Onset  . Bipolar disorder Mother   . Diabetes Mother   . Bipolar disorder Sister   . Diabetes Sister   . Alcohol abuse Father   . Hypertension Father   . Heart disease Other     SOCIAL HISTORY: Social History   Socioeconomic History  . Marital status: Married    Spouse name: Not on file  . Number of children: 3  . Years of education: college  . Highest education level: Associate degree: occupational, Hotel manager, or vocational program  Occupational History  . Occupation: Therapist, sports   Tobacco Use  . Smoking status: Never Smoker  . Smokeless tobacco: Never Used  Vaping Use  . Vaping Use: Never used  Substance and Sexual Activity  . Alcohol use: No    Alcohol/week: 0.0 standard drinks  . Drug use: No  . Sexual activity: Never  Other Topics Concern  . Not on file  Social History Narrative   04/24/2013 [name] was born and grew up in [place]. She reports that her childhood was "tough." She has 2 older sisters and a younger brother. She achieved an Associates Degree in nursing. 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 She has 2 daughters and one son. She denies any legal difficulties. She affiliates as Engineer, manufacturing. Her hobbies include reading, sewing, crafts, and cooking. She reports that her social support system consists of her friend and passed her. 04/24/2013 [name]      Lives alone.   Right-handed.   No caffeine use.   Social Determinants of Health   Financial Resource Strain:   . Difficulty of Paying Living Expenses: Not on file  Food Insecurity:   . Worried About Charity fundraiser in the Last Year: Not on file  . Ran Out of Food in the Last Year: Not on file  Transportation Needs:   . Lack of Transportation (Medical): Not on file  . Lack of Transportation (Non-Medical): Not on file  Physical Activity:   . Days of Exercise per Week: Not on file  . Minutes of Exercise per Session: Not on file  Stress:   . Feeling of Stress : Not on file  Social Connections:   . Frequency of Communication with Friends and Family: Not on file  . Frequency of Social Gatherings with Friends and Family: Not on file  . Attends Religious Services: Not on file  . Active Member of Clubs or Organizations: Not on file  . Attends Archivist Meetings: Not on file  . Marital Status: Not on file  Intimate Partner Violence:   . Fear of Current or Ex-Partner: Not on file  . Emotionally Abused: Not on file  .  Physically Abused: Not on file  . Sexually Abused:  Not on file      PHYSICAL EXAM  There were no vitals filed for this visit. There is no height or weight on file to calculate BMI.  Generalized: Well developed, in no acute distress   Neurological examination  Mentation: Alert oriented to time, place, history taking. Follows all commands speech and language fluent Cranial nerve II-XII: Pupils were equal round reactive to light. Extraocular movements were full, visual field were full on confrontational test. Facial sensation and strength were normal. Uvula tongue midline. Head turning and shoulder shrug  were normal and symmetric. Motor: The motor testing reveals 5 over 5 strength of all 4 extremities. Good symmetric motor tone is noted throughout.  Sensory: Sensory testing is intact to soft touch on all 4 extremities. No evidence of extinction is noted.  Coordination: Cerebellar testing reveals good finger-nose-finger and heel-to-shin bilaterally.  Gait and station: Gait is normal. Tandem gait is normal. Romberg is negative. No drift is seen.  Reflexes: Deep tendon reflexes are symmetric and normal bilaterally.   DIAGNOSTIC DATA (LABS, IMAGING, TESTING) - I reviewed patient records, labs, notes, testing and imaging myself where available.  Lab Results  Component Value Date   WBC 6.2 01/02/2020   HGB 13.4 01/02/2020   HCT 41.5 01/02/2020   MCV 96.1 01/02/2020   PLT 248 01/02/2020      Component Value Date/Time   NA 138 02/06/2020 0859   NA 142 12/21/2018 0810   K 4.2 02/06/2020 0859   CL 101 02/06/2020 0859   CO2 27 02/06/2020 0859   GLUCOSE 134 (H) 02/06/2020 0859   BUN 21 02/06/2020 0859   BUN 20 12/21/2018 0810   CREATININE 0.85 02/06/2020 0859   CREATININE 0.80 12/30/2015 1138   CALCIUM 9.3 02/06/2020 0859   PROT 7.3 12/31/2019 1542   PROT 6.8 12/21/2018 0810   ALBUMIN 4.2 12/31/2019 1542   ALBUMIN 4.6 12/21/2018 0810   AST 27 12/31/2019 1542   ALT 25 12/31/2019  1542   ALKPHOS 87 12/31/2019 1542   BILITOT 0.6 12/31/2019 1542   BILITOT 0.5 12/21/2018 0810   GFRNONAA >60 02/06/2020 0859   GFRAA >60 02/06/2020 0859   Lab Results  Component Value Date   CHOL 176 02/06/2020   HDL 35 (L) 02/06/2020   LDLCALC 95 02/06/2020   TRIG 230 (H) 02/06/2020   CHOLHDL 5.0 02/06/2020   Lab Results  Component Value Date   HGBA1C 6.0 (H) 02/08/2020   Lab Results  Component Value Date   VHQIONGE95 284 12/12/2017   Lab Results  Component Value Date   TSH 1.580 01/15/2020      ASSESSMENT AND PLAN 69 y.o. year old female  has a past medical history of Anxiety, Bipolar 1 disorder ([place]), Bradycardia (12/2015), Breast cancer, left breast ([place]) (07/22/2011), Cancer ([place]), CHF (congestive heart failure) ([place]), Depression, High cholesterol, Hypertension, IBS (irritable bowel syndrome), Impaired fasting glucose, Memory loss, OA (osteoarthritis), Obesity, Personal history of chemotherapy (2009), Personal history of radiation therapy (2009), Polycystic ovarian disease, Prediabetes (02/10/2020), Presence of permanent cardiac pacemaker (01/06/2016), and Sleep apnea. here with ***   I spent *** minutes of face-to-face and non-face-to-face time with patient.  This included previsit chart review, lab review, study review, order entry, electronic health record documentation, patient education.  Ward Givens, MSN, NP-C 06/18/2020, 4:50 PM [facility] [address] [place], [place] [address] [phone]

## 2020-06-19 ENCOUNTER — Ambulatory Visit: Payer: PPO | Admitting: Adult Health

## 2020-06-19 ENCOUNTER — Encounter: Payer: Self-pay | Admitting: Adult Health

## 2020-06-19 ENCOUNTER — Other Ambulatory Visit: Payer: Self-pay

## 2020-06-19 VITALS — BP 120/72 | Ht 64.0 in | Wt 271.6 lb

## 2020-06-19 DIAGNOSIS — Z9989 Dependence on other enabling machines and devices: Secondary | ICD-10-CM

## 2020-06-19 DIAGNOSIS — G4733 Obstructive sleep apnea (adult) (pediatric): Secondary | ICD-10-CM | POA: Diagnosis not present

## 2020-06-19 NOTE — Progress Notes (Signed)
PATIENT: [name] DOB: April 19, 1951  REASON FOR VISIT: follow up HISTORY FROM: patient  HISTORY OF PRESENT ILLNESS: Today 06/19/20   [name] is a 69 year old female with a history of obstructive sleep apnea on CPAP.  She returns today for follow-up.  Her download indicates that she use her machine nightly for compliance of 100%.  She used her machine greater than 4 hours each night.  On average she uses her machine 6 and 1 minute.  Her residual AHI is 1.2 on 6 to 12 cm of water with EPR 2.  Leak in the 95th percentile is 35.9 L/min.  She states that she does feel the mask leaking at night.  She states that she feels that she is not getting as much rest because the mask leaks all night.  She returns today for an evaluation.  HISTORY Andrea Kohler Baldwinis a 69 y.o. year old White or Caucasian female patientand was seen here upon a referralon 01/07/2020 from Dr Wolfgang Phoenix, MD and cardiologist Dr. Crissie Sickles and Dr. Domenic Polite, MD  Chiefconcernaccording to patient :  She has been very fatigued short of breath and feels exhausted.  She has bipolar disorder and felt memory was affected has been seen by Dr Krista Blue within the last 3 years    [name] reports that her primary care physician had urged her probably a year ago to go through a sleep evaluation but about doing the last 6 weeks she had cardiovascular evaluations was diagnosed with CHF and have been repeatedly  admitted and observed in the hospital for short-term.  Her cardiologist now urges the sleep study as well.  She has been very fatigued short of breath and feels exhausted.  She has never been a smoker or tobacco user in any form she has a past history of depression anxiety, she had a pacemaker placed 3 years ago, she is a breast cancer survivor left breast, she has a history of hypertension, bilateral knee replacements colonoscopies she underwent a lumpectomy of the left breast and over 10 years ago had a  cholecystectomy and hysterectomy.  The diagnosis of CHF was stated on 12-05-19.  She reports that she was fluid overloaded was admitted to [facility] and had an echocardiogram she followed up with the cardiology nurse practitioner [name] on 2 June had a normal stress test on 4 June and on 21 June just last week was evaluated with a normal cardiac cath.  She was hospitalized until 6-23.  Her cardiologist states that she had recent dyspnea due to diastolic heart failure, intermittent wheezing was noted and her chest x-ray had revealed interstitial edema.  Cardiac enzymes were negative the EKG did not show acute changes.  She was diuresed and her echocardiogram showed a normal ejection fraction with a grade 1 diastolic dysfunction.  Dr. Domenic Polite agreed with outpatient follow-up discharged on low-dose Lasix.  She had seen Crissie Sickles in October 2020 for sinus node dysfunction s/p Biotronik PPM insertion in the year 2018.   Sleeprelevant medical history: Nocturia: may be once ,  Tonsillectomy in childhood, no TBI, no ENT procedures but wisdom teeth removed. [name] history:No other family member on CPAP with OSA, insomnia, and no sleep walkers.  Social history:Patient is retired from Nursing profession and left at age 59 , she  lives in a household with her niece- her husband lives in psychiatric facility-  Family status is married , with 3 adult children, no grandchildren.  Pets are present,  a cat.  Tobacco use- never . ETOH use; never ,  Caffeine intake in form of Coffee( 2-3) Soda( none) Tea ( none ) -no energy drinks. Regular exercise in form of walking, [employer] member  Hobbies : gardening     Sleep habits are as follows:The patient's dinner time is between 5 PM. The patient goes to bed at 11 PM and continues to sleep for 8 hours, wakes rarely bathroom breaks,and wakes still dizzy and not refreshed, going back to bed , feeling anxious, fleeing back to bed- and stays there for  another 3-4 hours. She may get up at 2 PM.  The preferred sleep position is I on her right side or in supine, with the support of 1 pillow. Dreams are reportedly rare.  Noon- 2 PM is the usual rise time. The patient wakes up spontaneously.  She reports not feeling refreshed or restored in AM, with symptoms such as dry mouth, dizziness, anxiety, palpitations,   and residual fatigue.  Naps are taken frequently, lasting from 1-4 hours and are more refreshing than nocturnal sleep.   REVIEW OF SYSTEMS: Out of a complete 14 system review of symptoms, the patient complains only of the following symptoms, and all other reviewed systems are negative.  FSS 18 ESS 5  ALLERGIES: Allergies  Allergen Reactions  . Imipramine Hives and Rash  . Tricor [Fenofibrate] Other (See Comments)    Pain, "aching"    HOME MEDICATIONS: Outpatient Medications Prior to Visit  Medication Sig Dispense Refill  . acetaminophen (TYLENOL) 650 MG CR tablet Take 650-1,300 mg by mouth See admin instructions. 1300MG  IN THE MORNING AND 650MG  AT BEDTIME    . albuterol (VENTOLIN HFA) 108 (90 Base) MCG/ACT inhaler Inhale 1-2 puffs into the lungs every 6 (six) hours as needed for wheezing or shortness of breath. 18 g 0  . ALPRAZolam (XANAX XR) 1 MG 24 hr tablet Take 1 mg by mouth every morning.     [name] ALPRAZolam (XANAX) 1 MG tablet Take 0.5 mg by mouth daily as needed for anxiety.     . calcium carbonate (TUMS - DOSED IN MG ELEMENTAL CALCIUM) 500 MG chewable tablet Chew 1-3 tablets by mouth daily as needed for indigestion or heartburn.    . furosemide (LASIX) 20 MG tablet Take 20 mg by mouth daily as needed for fluid or edema.     [name] glucosamine-chondroitin 500-400 MG tablet Take 2 tablets by mouth every morning.     . lamoTRIgine (LAMICTAL) 200 MG tablet Take 400 mg by mouth at bedtime.    . Multiple Vitamin (MULTIVITAMIN) tablet Take 1 tablet by mouth daily.    . QUEtiapine (SEROQUEL) 200 MG tablet Take 400 mg by mouth at bedtime.     [name] telmisartan (MICARDIS) 40 MG tablet Take 40 mg by mouth daily.    . Vitamin D, Ergocalciferol, (DRISDOL) 50000 units CAPS capsule Take 50,000 Units by mouth every Sunday.      No facility-administered medications prior to visit.    PAST MEDICAL HISTORY: Past Medical History:  Diagnosis Date  . Anxiety   . Bipolar 1 disorder ([place])   . 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 Breast cancer, left breast ([place]) 07/22/2011  . Cancer ([place])   . CHF (congestive heart failure) ([place])   . Depression   . High cholesterol   . Hypertension   . IBS (irritable bowel syndrome)   . Impaired fasting glucose   . Memory loss   .  OA (osteoarthritis)   . Obesity   . Personal history of chemotherapy 2009  . Personal history of radiation therapy 2009  . Polycystic ovarian disease   . Prediabetes 02/10/2020  . 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 Sleep apnea     PAST SURGICAL HISTORY: Past Surgical History:  Procedure Laterality Date  . ABDOMINAL HYSTERECTOMY    . APPENDECTOMY    . BREAST BIOPSY Left 2011  . BREAST BIOPSY  2008  . BREAST LUMPECTOMY Left 2008  . CHOLECYSTECTOMY    . COLONOSCOPY N/A 09/13/2013   Procedure: COLONOSCOPY;  Surgeon: [name], MD;  [facility];  Service: Endoscopy;  Laterality: N/A;  730  . EP IMPLANTABLE DEVICE N/A 01/06/2016   Procedure: Pacemaker Implant;  Surgeon: [name], MD;  Location: [facility];  Service: Cardiovascular;  Laterality: N/A;  . ESOPHAGOGASTRODUODENOSCOPY N/A 01/17/2014   Procedure: ESOPHAGOGASTRODUODENOSCOPY (EGD);  Surgeon: [name], MD;  [facility];  Service: Endoscopy;  Laterality: N/A;  245  . RIGHT/LEFT HEART CATH AND CORONARY ANGIOGRAPHY N/A 01/01/2020   Procedure: RIGHT/LEFT HEART CATH AND CORONARY ANGIOGRAPHY;  Surgeon: [name], MD;  Location: [facility];  Service: Cardiovascular;  Laterality: N/A;  . TOTAL KNEE  ARTHROPLASTY Bilateral right knee   2012, 2007    FAMILY HISTORY: Family History  Problem Relation Age of Onset  . Bipolar disorder Mother   . Diabetes Mother   . Bipolar disorder Sister   . Diabetes Sister   . Alcohol abuse Father   . Hypertension Father   . Heart disease Other     SOCIAL HISTORY: Social History   Socioeconomic History  . Marital status: Married    Spouse name: Not on file  . Number of children: 3  . Years of education: college  . Highest education level: Associate degree: occupational, Hotel manager, or vocational program  Occupational History  . Occupation: Therapist, sports  Tobacco Use  . Smoking status: Never Smoker  . Smokeless tobacco: Never Used  Vaping Use  . Vaping Use: Never used  Substance and Sexual Activity  . Alcohol use: No    Alcohol/week: 0.0 standard drinks  . Drug use: No  . Sexual activity: Never  Other Topics Concern  . Not on file  Social History Narrative   04/24/2013 [name] was born and grew up in [place]. She reports that her childhood was "tough." She has 2 older sisters and a younger brother. She achieved an Associates Degree in nursing. 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 She has 2 daughters and one son. She denies any legal difficulties. She affiliates as Engineer, manufacturing. Her hobbies include reading, sewing, crafts, and cooking. She reports that her social support system consists of her friend and passed her. 04/24/2013 [name]      Lives alone.   Right-handed.   No caffeine use.   Social Determinants of Health   Financial Resource Strain: Not on file  Food Insecurity: Not on file  Transportation Needs: Not on file  Physical Activity: Not on file  Stress: Not on file  Social Connections: Not on file  Intimate Partner Violence: Not on file      PHYSICAL EXAM  Vitals:   06/19/20 1030  BP: 120/72  Weight: 271 lb 9.6 oz (123.2 kg)  Height: 5\' 4"  (1.626 m)   Body mass  index is 46.62 kg/m.  Generalized: Well developed, in no acute distress  Chest: Lungs clear to auscultation bilaterally  Neurological examination  Mentation: Alert oriented to time, place, history taking. Follows all commands speech and language fluent Cranial nerve II-XII: Extraocular movements were full, visual field were full on confrontational test Head turning and shoulder shrug  were normal and symmetric. Motor: The motor testing reveals 5 over 5 strength of all 4 extremities. Good symmetric motor tone is noted throughout.  Sensory: Sensory testing is intact to soft touch on all 4 extremities. No evidence of extinction is noted.  Gait and station: Gait is normal.    DIAGNOSTIC DATA (LABS, IMAGING, TESTING) - I reviewed patient records, labs, notes, testing and imaging myself where available.  Lab Results  Component Value Date   WBC 6.2 01/02/2020   HGB 13.4 01/02/2020   HCT 41.5 01/02/2020   MCV 96.1 01/02/2020   PLT 248 01/02/2020      Component Value Date/Time   NA 138 02/06/2020 0859   NA 142 12/21/2018 0810   K 4.2 02/06/2020 0859   CL 101 02/06/2020 0859   CO2 27 02/06/2020 0859   GLUCOSE 134 (H) 02/06/2020 0859   BUN 21 02/06/2020 0859   BUN 20 12/21/2018 0810   CREATININE 0.85 02/06/2020 0859   CREATININE 0.80 12/30/2015 1138   CALCIUM 9.3 02/06/2020 0859   PROT 7.3 12/31/2019 1542   PROT 6.8 12/21/2018 0810   ALBUMIN 4.2 12/31/2019 1542   ALBUMIN 4.6 12/21/2018 0810   AST 27 12/31/2019 1542   ALT 25 12/31/2019 1542   ALKPHOS 87 12/31/2019 1542   BILITOT 0.6 12/31/2019 1542   BILITOT 0.5 12/21/2018 0810   GFRNONAA >60 02/06/2020 0859   GFRAA >60 02/06/2020 0859   Lab Results  Component Value Date   CHOL 176 02/06/2020   HDL 35 (L) 02/06/2020   LDLCALC 95 02/06/2020   TRIG 230 (H) 02/06/2020   CHOLHDL 5.0 02/06/2020   Lab Results  Component Value Date   HGBA1C 6.0 (H) 02/08/2020   Lab Results  Component Value Date   ZOXWRUEA54 098 12/12/2017    Lab Results  Component Value Date   TSH 1.580 01/15/2020      ASSESSMENT AND PLAN 69 y.o. year old female  has a past medical history of Anxiety, Bipolar 1 disorder ([place]), Bradycardia (12/2015), Breast cancer, left breast ([place]) (07/22/2011), Cancer ([place]), CHF (congestive heart failure) ([place]), Depression, High cholesterol, Hypertension, IBS (irritable bowel syndrome), Impaired fasting glucose, Memory loss, OA (osteoarthritis), Obesity, Personal history of chemotherapy (2009), Personal history of radiation therapy (2009), Polycystic ovarian disease, Prediabetes (02/10/2020), Presence of permanent cardiac pacemaker (01/06/2016), and Sleep apnea. here with:  1. OSA on CPAP  - CPAP compliance excellent - Good treatment of AHI  - mask refitting due to mask leaking - Encourage patient to use CPAP nightly and > 4 hours each night - F/U in 1 year or sooner if needed   I spent 25 minutes of face-to-face and non-face-to-face time with patient.  This included previsit chart review, lab review, study review, order entry, electronic health record documentation, patient education.  Ward Givens, MSN, NP-C 06/19/2020, 11:01 AM [facility] [address] [place], [place] [address] [phone]

## 2020-06-19 NOTE — Progress Notes (Signed)
Order for  Mask refitting sent to [place] via community msg. Confirmation received that the order transmitted was successful.

## 2020-06-19 NOTE — Patient Instructions (Addendum)
Continue using CPAP nightly and greater than 4 hours each night.  °Mask refitting °If your symptoms worsen or you develop new symptoms please let us know.  ° °

## 2020-06-30 DIAGNOSIS — R7303 Prediabetes: Secondary | ICD-10-CM | POA: Diagnosis not present

## 2020-06-30 DIAGNOSIS — E781 Pure hyperglyceridemia: Secondary | ICD-10-CM | POA: Diagnosis not present

## 2020-06-30 DIAGNOSIS — Z79899 Other long term (current) drug therapy: Secondary | ICD-10-CM | POA: Diagnosis not present

## 2020-07-01 LAB — LIPID PANEL
Chol/HDL Ratio: 4.8 ratio — ABNORMAL HIGH (ref 0.0–4.4)
Cholesterol, Total: 176 mg/dL (ref 100–199)
HDL: 37 mg/dL — ABNORMAL LOW (ref >39)
LDL Chol Calc (NIH): 97 mg/dL (ref 0–99)
Triglycerides: 246 mg/dL — ABNORMAL HIGH (ref 0–149)
VLDL Cholesterol Cal: 42 mg/dL — ABNORMAL HIGH (ref 5–40)

## 2020-07-01 LAB — BASIC METABOLIC PANEL
BUN/Creatinine Ratio: 17 (ref 12–28)
BUN: 14 mg/dL (ref 8–27)
CO2: 26 mmol/L (ref 20–29)
Calcium: 9.8 mg/dL (ref 8.7–10.3)
Chloride: 103 mmol/L (ref 96–106)
Creatinine, Ser: 0.83 mg/dL (ref 0.57–1.00)
GFR calc Af Amer: 84 mL/min/{1.73_m2} (ref >59)
GFR calc non Af Amer: 73 mL/min/{1.73_m2} (ref >59)
Glucose: 127 mg/dL — ABNORMAL HIGH (ref 65–99)
Potassium: 4.9 mmol/L (ref 3.5–5.2)
Sodium: 146 mmol/L — ABNORMAL HIGH (ref 134–144)

## 2020-07-01 LAB — HEPATIC FUNCTION PANEL
ALT: 18 IU/L (ref 0–32)
AST: 19 IU/L (ref 0–40)
Albumin: 4.6 g/dL (ref 3.8–4.8)
Alkaline Phosphatase: 108 IU/L (ref 44–121)
Bilirubin Total: 0.5 mg/dL (ref 0.0–1.2)
Bilirubin, Direct: 0.16 mg/dL (ref 0.00–0.40)
Total Protein: 6.9 g/dL (ref 6.0–8.5)

## 2020-07-02 ENCOUNTER — Telehealth: Payer: Self-pay | Admitting: Adult Health

## 2020-07-02 NOTE — Telephone Encounter (Signed)
Spoke to pt and she is just documenting that she is trying to use machine but is unable due to it not working properly and has appt next Wednesday  to get repaired.

## 2020-07-02 NOTE — Telephone Encounter (Signed)
Pt called, part of the CPAP machine is not working. [facility] said could fix until the middle of next week, Wednesday at the earliest. Concerned about my insurance. Would like a call from the nurse.

## 2020-07-03 ENCOUNTER — Ambulatory Visit (INDEPENDENT_AMBULATORY_CARE_PROVIDER_SITE_OTHER): Payer: PPO

## 2020-07-03 DIAGNOSIS — R001 Bradycardia, unspecified: Secondary | ICD-10-CM | POA: Diagnosis not present

## 2020-07-04 LAB — CUP PACEART REMOTE DEVICE CHECK
Date Time Interrogation Session: 20211222082010
Implantable Lead Implant Date: 20170627
Implantable Lead Implant Date: 20170627
Implantable Lead Location: 753859
Implantable Lead Location: 753860
Implantable Lead Model: 377
Implantable Lead Model: 377
Implantable Lead Serial Number: 49436227
Implantable Lead Serial Number: 49471106
Implantable Pulse Generator Implant Date: 20170627
Pulse Gen Model: 394969
Pulse Gen Serial Number: 68786455

## 2020-07-09 ENCOUNTER — Other Ambulatory Visit: Payer: Self-pay | Admitting: *Deleted

## 2020-07-09 ENCOUNTER — Ambulatory Visit (INDEPENDENT_AMBULATORY_CARE_PROVIDER_SITE_OTHER): Payer: PPO | Admitting: Family Medicine

## 2020-07-09 ENCOUNTER — Encounter: Payer: Self-pay | Admitting: Family Medicine

## 2020-07-09 ENCOUNTER — Other Ambulatory Visit: Payer: Self-pay

## 2020-07-09 VITALS — BP 128/84 | Temp 97.8°F | Ht 64.0 in | Wt 272.2 lb

## 2020-07-09 DIAGNOSIS — E781 Pure hyperglyceridemia: Secondary | ICD-10-CM | POA: Diagnosis not present

## 2020-07-09 DIAGNOSIS — Z1211 Encounter for screening for malignant neoplasm of colon: Secondary | ICD-10-CM

## 2020-07-09 DIAGNOSIS — Z0000 Encounter for general adult medical examination without abnormal findings: Secondary | ICD-10-CM

## 2020-07-09 DIAGNOSIS — I10 Essential (primary) hypertension: Secondary | ICD-10-CM | POA: Diagnosis not present

## 2020-07-09 NOTE — Progress Notes (Signed)
Gi referral put in to dr Karilyn Cota for colonoscopy in sping time

## 2020-07-09 NOTE — Progress Notes (Signed)
Subjective:    Patient ID: [name], female    DOB: 1950/12/13, 69 y.o.   MRN: [id number]  HPI The patient comes in today for a wellness visit.   Patient working hard at trying to be healthy At times her blood pressure is on the low end which concerns her How those days she takes that half a tablet She states multiple days she only uses 1/2 tablet other days a whole tablet  A review of their health history was completed.  A review of medications was also completed.  Any needed refills; no  Eating habits: eating good  Falls/  MVA accidents in past few months: no  Regular exercise: going to [facility] 5 times a week  Specialist pt sees on regular basis:Cardiologist and psychiatrist   Preventative health issues were discussed.   Additional concerns: discuss recent labs Discuss weight and labs We did discuss healthy diet regular activity Her lab work triglycerides are mildly elevated but are not in a worrisome range    Review of Systems  Constitutional: Negative for activity change, appetite change and fatigue.  HENT: Negative for congestion and rhinorrhea.   Respiratory: Negative for cough and shortness of breath.   Cardiovascular: Negative for chest pain and leg swelling.  Gastrointestinal: Negative for abdominal pain and diarrhea.  Endocrine: Negative for polydipsia and polyphagia.  Skin: Negative for color change.  Neurological: Negative for dizziness and weakness.  Psychiatric/Behavioral: Negative for behavioral problems and confusion.       Objective:   Physical Exam Vitals reviewed.  Constitutional:      General: She is not in acute distress.    Appearance: She is well-nourished.  HENT:     Head: Normocephalic and atraumatic.  Eyes:     General:        Right eye: No discharge.        Left eye: No discharge.  Neck:     Trachea: No tracheal deviation.  Cardiovascular:     Rate and Rhythm: Normal rate and regular rhythm.     Heart sounds: Normal  heart sounds. No murmur heard.   Pulmonary:     Effort: Pulmonary effort is normal. No respiratory distress.     Breath sounds: Normal breath sounds.  Musculoskeletal:        General: No edema.  Lymphadenopathy:     Cervical: No cervical adenopathy.  Skin:    General: Skin is warm and dry.  Neurological:     Mental Status: She is alert.     Coordination: Coordination normal.  Psychiatric:        Mood and Affect: Mood and affect normal.        Behavior: Behavior normal.     Patient defers on GYN exam as well as breast exam      Assessment & Plan:  Patient was encouraged to get her Covid booster and flu shot ASAP 1. Hypertriglyceridemia Dietary measures the best approach  2. Essential hypertension I have encouraged patient to just do a half a tablet daily to help keep her blood pressure under control on the days that it is slightly more elevated take a full tablet this would help avoid some of the low blood pressure readings she has been having that causes her to feel washed out she will follow-up within 6 months  3. Encounter for subsequent annual wellness visit (AWV) in Medicare patient Adult wellness-complete.wellness physical was conducted today. Importance of diet and exercise were discussed in detail.  In  addition to this a discussion regarding safety was also covered. We also reviewed over immunizations and gave recommendations regarding current immunization needed for age.  In addition to this additional areas were also touched on including: Preventative health exams needed:  Colonoscopy springtime this coming year  Patient was advised yearly wellness exam

## 2020-07-10 ENCOUNTER — Encounter (INDEPENDENT_AMBULATORY_CARE_PROVIDER_SITE_OTHER): Payer: Self-pay | Admitting: *Deleted

## 2020-07-14 DIAGNOSIS — G4733 Obstructive sleep apnea (adult) (pediatric): Secondary | ICD-10-CM | POA: Diagnosis not present

## 2020-07-16 NOTE — Progress Notes (Signed)
Remote pacemaker transmission.   

## 2020-07-22 ENCOUNTER — Encounter: Payer: Self-pay | Admitting: Adult Health

## 2020-07-22 DIAGNOSIS — Z9989 Dependence on other enabling machines and devices: Secondary | ICD-10-CM

## 2020-07-22 DIAGNOSIS — G4733 Obstructive sleep apnea (adult) (pediatric): Secondary | ICD-10-CM

## 2020-07-23 NOTE — Telephone Encounter (Addendum)
Sent CMM for [facility]/adpt for pt needs mask refitting. Jacquelyne Balint, RN got it, thanks

## 2020-08-05 DIAGNOSIS — H353210 Exudative age-related macular degeneration, right eye, stage unspecified: Secondary | ICD-10-CM | POA: Diagnosis not present

## 2020-08-05 DIAGNOSIS — H538 Other visual disturbances: Secondary | ICD-10-CM | POA: Diagnosis not present

## 2020-08-05 DIAGNOSIS — H353131 Nonexudative age-related macular degeneration, bilateral, early dry stage: Secondary | ICD-10-CM | POA: Diagnosis not present

## 2020-08-12 ENCOUNTER — Encounter (INDEPENDENT_AMBULATORY_CARE_PROVIDER_SITE_OTHER): Payer: Self-pay | Admitting: Ophthalmology

## 2020-08-12 ENCOUNTER — Other Ambulatory Visit: Payer: Self-pay

## 2020-08-12 ENCOUNTER — Ambulatory Visit (INDEPENDENT_AMBULATORY_CARE_PROVIDER_SITE_OTHER): Payer: PPO | Admitting: Ophthalmology

## 2020-08-12 ENCOUNTER — Encounter: Payer: Self-pay | Admitting: Family Medicine

## 2020-08-12 DIAGNOSIS — H353132 Nonexudative age-related macular degeneration, bilateral, intermediate dry stage: Secondary | ICD-10-CM

## 2020-08-12 DIAGNOSIS — H353211 Exudative age-related macular degeneration, right eye, with active choroidal neovascularization: Secondary | ICD-10-CM | POA: Diagnosis not present

## 2020-08-12 DIAGNOSIS — H3321 Serous retinal detachment, right eye: Secondary | ICD-10-CM

## 2020-08-12 DIAGNOSIS — H35351 Cystoid macular degeneration, right eye: Secondary | ICD-10-CM

## 2020-08-12 DIAGNOSIS — H35071 Retinal telangiectasis, right eye: Secondary | ICD-10-CM | POA: Diagnosis not present

## 2020-08-12 DIAGNOSIS — H43821 Vitreomacular adhesion, right eye: Secondary | ICD-10-CM | POA: Insufficient documentation

## 2020-08-12 MED ORDER — FLUORESCEIN SODIUM 10 % IV SOLN
500.0000 mg | INTRAVENOUS | Status: AC | PRN
  Administered 2020-08-12: 500 mg via INTRAVENOUS

## 2020-08-12 NOTE — Assessment & Plan Note (Signed)
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

## 2020-08-12 NOTE — Assessment & Plan Note (Addendum)
Macular telangiectasis (MAC-TEL), or parafoveal telangiectasis is a condition of "unknown" cause.  Findings in or near the macula (center of vision) consist of microaneurysms (leaking small capillaries), often with leakage of fluid which in the active phase can impact fine discriminatory vision, and in some cases trigger profound scarring in the macula, with severe permanent vision loss.  Standard treatment is observation and periodic examinations to monitor for treatable complications.   The cause  of this condition is "unknown".  However, the practice of Dr. Zadie Rhine has discovered an association with sleep apnea with its nightly periods of low oxygen in the blood stream (hypoxia), retained carbon dioxide (hypercapnia), associated with transient nocturnal hypertensive episodes.   More recently, some patients also been found to have advanced lung disease, whether asthma or COPD, with similar findings.  Dr. Zadie Rhine has been evaluating the association of sleep apnea, nightly hypoxia, and Macular telangiectasis for over 18 years.  Most patients are found to be noncompliant with sleep apnea therapy or testing in the past.  Resumption of CPAP or similar therapy is strongly recommended if ordered in the past.  Upon review of risk factors or findings positive for sleep apnea, more formal, extensive sleep laboratory or home testing, may be recommended.  Numerous patients, proven to have MAC-TEL, have improved or resolved their ey condition promptly, within weeks, of the use of nighttime oxygen supplementation or continuous positive airway pressure (CPAP).We will not institute therapy for pseudophakic CME at this time as we will await proper fitting and adjustments of her CPAP appliance.  If this compliance improves and functions well for her, this macular edema and leakage may very well subside in each eye without further therapy

## 2020-08-12 NOTE — Progress Notes (Signed)
08/12/2020     CHIEF COMPLAINT Patient presents for Blurred Vision (Pt referred by Dr. Gershon Crane for Wet AMD OD and Dry AMD OS/Pt c/o changes in vision OD. Pt states she has been seeing crescent moons in OD vision. Pt states when she covers OS, OD sees wavy lines while looking at [employer]. Pt had cat sx OS 10/20, OD 10/27. Pt also started use of CPAP in October. Has been having trouble with mask fit.)   HISTORY OF PRESENT ILLNESS: [name] is a 70 y.o. female who presents to the clinic today for:   HPI    Blurred Vision    In right eye.  Onset was gradual.  Vision is blurred and distorted.  Severity is moderate.  This started 2.5 weeks ago.  Occurring constantly.  It is worse throughout the day and when reading.  Context:  reading.  Since onset it is stable.  Treatments tried include no treatments.  Response to treatment was no improvement.  I, the attending physician,  performed the HPI with the patient and updated documentation appropriately. Additional comments: Pt referred by Dr. Gershon Crane for Wet AMD OD and Dry AMD OS Pt c/o changes in vision OD. Pt states she has been seeing crescent moons in OD vision. Pt states when she covers OS, OD sees wavy lines while looking at [employer]. Pt had cat sx OS 10/20, OD 10/27. Pt also started use of CPAP in October. Has been having trouble with mask fit.       Last edited by Tilda Franco on 08/12/2020  8:57 AM. (History)      Referring physician: Kathyrn Drown, MD [address] [place],  [place] [address]  HISTORICAL INFORMATION:   Selected notes from the MEDICAL RECORD NUMBER    Lab Results  Component Value Date   HGBA1C 6.0 (H) 02/08/2020     CURRENT MEDICATIONS: No current outpatient medications on file. (Ophthalmic Drugs)   No current facility-administered medications for this visit. (Ophthalmic Drugs)   Current Outpatient Medications (Other)  Medication Sig  . acetaminophen (TYLENOL) 650 MG CR tablet  Take 650-1,300 mg by mouth See admin instructions. 1300MG IN THE MORNING AND 650MG AT BEDTIME  . albuterol (VENTOLIN HFA) 108 (90 Base) MCG/ACT inhaler Inhale 1-2 puffs into the lungs every 6 (six) hours as needed for wheezing or shortness of breath.  . ALPRAZolam (XANAX XR) 1 MG 24 hr tablet Take 1 mg by mouth every morning.   [name] ALPRAZolam (XANAX) 1 MG tablet Take 0.5 mg by mouth daily as needed for anxiety.   . calcium carbonate (TUMS - DOSED IN MG ELEMENTAL CALCIUM) 500 MG chewable tablet Chew 1-3 tablets by mouth daily as needed for indigestion or heartburn.  . furosemide (LASIX) 20 MG tablet Take 20 mg by mouth daily as needed for fluid or edema.   [name] glucosamine-chondroitin 500-400 MG tablet Take 2 tablets by mouth every morning.   . lamoTRIgine (LAMICTAL) 200 MG tablet Take 400 mg by mouth at bedtime.  . Multiple Vitamin (MULTIVITAMIN) tablet Take 1 tablet by mouth daily.  . QUEtiapine (SEROQUEL) 200 MG tablet Take 400 mg by mouth at bedtime.  [name] telmisartan (MICARDIS) 40 MG tablet Take 40 mg by mouth daily.  . Vitamin D, Ergocalciferol, (DRISDOL) 50000 units CAPS capsule Take 50,000 Units by mouth every Sunday.    No current facility-administered medications for this visit. (Other)      REVIEW OF SYSTEMS:    ALLERGIES  Allergies  Allergen Reactions  . Imipramine Hives and Rash  . Tricor [Fenofibrate] Other (See Comments)    Pain, "aching"    PAST MEDICAL HISTORY Past Medical History:  Diagnosis Date  . Anxiety   . Bipolar 1 disorder ([place])   . 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 Breast cancer, left breast ([place]) 07/22/2011  . Cancer ([place])   . CHF (congestive heart failure) ([place])   . Depression   . High cholesterol   . Hypertension   . IBS (irritable bowel syndrome)   . Impaired fasting glucose   . Memory loss   . OA (osteoarthritis)   . Obesity   . Personal history of chemotherapy 2009  . Personal history of radiation  therapy 2009  . Polycystic ovarian disease   . Prediabetes 02/10/2020  . 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 Sleep apnea    Past Surgical History:  Procedure Laterality Date  . ABDOMINAL HYSTERECTOMY    . APPENDECTOMY    . BREAST BIOPSY Left 2011  . BREAST BIOPSY  2008  . BREAST LUMPECTOMY Left 2008  . CHOLECYSTECTOMY    . COLONOSCOPY N/A 09/13/2013   Procedure: COLONOSCOPY;  Surgeon: [name], MD;  [facility];  Service: Endoscopy;  Laterality: N/A;  730  . EP IMPLANTABLE DEVICE N/A 01/06/2016   Procedure: Pacemaker Implant;  Surgeon: [name], MD;  Location: [facility];  Service: Cardiovascular;  Laterality: N/A;  . ESOPHAGOGASTRODUODENOSCOPY N/A 01/17/2014   Procedure: ESOPHAGOGASTRODUODENOSCOPY (EGD);  Surgeon: [name], MD;  [facility]: [facility];  Service: Endoscopy;  Laterality: N/A;  245  . RIGHT/LEFT HEART CATH AND CORONARY ANGIOGRAPHY N/A 01/01/2020   Procedure: RIGHT/LEFT HEART CATH AND CORONARY ANGIOGRAPHY;  Surgeon: [name], MD;  Location: [facility];  Service: Cardiovascular;  Laterality: N/A;  . TOTAL KNEE ARTHROPLASTY Bilateral right knee   2012, 2007    FAMILY HISTORY Family History  Problem Relation Age of Onset  . Bipolar disorder Mother   . Diabetes Mother   . Bipolar disorder Sister   . Diabetes Sister   . Alcohol abuse Father   . Hypertension Father   . Heart disease Other     SOCIAL HISTORY Social History   Tobacco Use  . Smoking status: Never Smoker  . Smokeless tobacco: Never Used  Vaping Use  . Vaping Use: Never used  Substance Use Topics  . Alcohol use: No    Alcohol/week: 0.0 standard drinks  . Drug use: No         OPHTHALMIC EXAM: Base Eye Exam    Visual Acuity (Snellen - Linear)      Right Left   Dist [place] 20/50 20/25   Dist ph [place] 20/40        Tonometry (Tonopen, 9:02 AM)      Right Left   Pressure 14 13       Pupils      Pupils Dark Light Shape  React APD   Right PERRL 5 4 Round Brisk None   Left PERRL 5 4 Round Brisk None       Visual Fields (Counting fingers)      Left Right    Full Full       Extraocular Movement      Right Left    Full Full       Neuro/Psych    Oriented x3: Yes   Mood/Affect:  Normal       Dilation    Both eyes: 1.0% Mydriacyl, 2.5% Phenylephrine @ 9:02 AM        Slit Lamp and Fundus Exam    External Exam      Right Left   External Normal Normal       Slit Lamp Exam      Right Left   Lids/Lashes Normal Normal   Conjunctiva/Sclera White and quiet White and quiet   Cornea Clear Clear   Anterior Chamber Deep and quiet Deep and quiet   Iris Round and reactive Round and reactive   Lens Centered posterior chamber intraocular lens Centered posterior chamber intraocular lens   Anterior Vitreous Normal Normal       Fundus Exam      Right Left   Posterior Vitreous Normal Normal   Disc Normal Normal   C/D Ratio 0.1 0.2   Macula 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, Soft drusen, no macular thickening, no membrane, no hemorrhage 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, Soft drusen, no macular thickening, no membrane, no hemorrhage   Vessels Normal Normal   Periphery Normal Normal          IMAGING AND PROCEDURES  Imaging and Procedures for 08/12/20  OCT, Retina - OU - Both Eyes       Right Eye Quality was good. Scan locations included subfoveal. Central Foveal Thickness: 400. Findings include vitreomacular adhesion , subretinal fluid, retinal drusen , cystoid macular edema, abnormal foveal contour.   Left Eye Quality was good. Scan locations included subfoveal. Central Foveal Thickness: 325. Findings include vitreomacular adhesion , retinal drusen , abnormal foveal contour, no IRF, no SRF, cystoid macular edema.   Notes OD with micro-CME in the temporal aspect of the fovea with a serous foveal elevation.  Vitreomacular adhesion with no real inner foveal traction is  noted incidentally.  Additionally there is also micro-CME on the superonasal aspect of the foveal avascular zone  OS with what could be described as micro-CME temporally yet no overt thickening.       Fluorescein Angiography Optos (Transit OD)       Injection:  500 mg Fluorescein Sodium 10 % injection   NDC: [phone]   Route: Intravenous, Site: Right ArmRight Eye   Progression has no prior data. Early phase findings include window defect. Mid/Late phase findings include leakage, window defect, microaneurysm. Choroidal neovascularization is not present.   Left Eye   Progression has no prior data. Mid/Late phase findings include microaneurysm, leakage. Choroidal neovascularization is not present.   Notes As well asMicroscopic perifoveal leakage inferior, and temporal portions of the macula OD, As well as superonasal.  This has the appearance of either pseudophakic CME mild resolving or a possibly macular telangiectasis  Please note that the patient is in the midst of a recent diagnosis of sleep apnea and attempting to find an appropriate mask, set up that she is able to comply with the nightly use of CPAP.  She remains highly motivated.  OS similarly has some small microaneurysms inferior to the fovea but no overt CME.  Watch temporal to the fovea as well       Color Fundus Photography Optos - OU - Both Eyes       Right Eye Macula : retinal pigment epithelium abnormalities, drusen. Vessels : normal observations. Periphery : normal observations.   Left Eye Progression has no prior data. Disc findings include normal observations. Macula : drusen, retinal pigment epithelium abnormalities. Vessels : normal observations.  Periphery : normal observations.   Notes Intermediate age-related macular degeneration by color fundus photography.  Normal optic nerves normal periphery normal media                ASSESSMENT/PLAN:  Cystoid macular edema, right eye The micro-CME  as well as combined with a serous retinal detachment subfoveal right eye requires the performance of fluorescein angiography to look for signs of full on r wet AMD or is this MAC-TEL  Type 2 macular telangiectasis, right Macular telangiectasis (MAC-TEL), or parafoveal telangiectasis is a condition of "unknown" cause.  Findings in or near the macula (center of vision) consist of microaneurysms (leaking small capillaries), often with leakage of fluid which in the active phase can impact fine discriminatory vision, and in some cases trigger profound scarring in the macula, with severe permanent vision loss.  Standard treatment is observation and periodic examinations to monitor for treatable complications.   The cause  of this condition is "unknown".  However, the practice of Dr. Zadie Rhine has discovered an association with sleep apnea with its nightly periods of low oxygen in the blood stream (hypoxia), retained carbon dioxide (hypercapnia), associated with transient nocturnal hypertensive episodes.   More recently, some patients also been found to have advanced lung disease, whether asthma or COPD, with similar findings.  Dr. Zadie Rhine has been evaluating the association of sleep apnea, nightly hypoxia, and Macular telangiectasis for over 18 years.  Most patients are found to be noncompliant with sleep apnea therapy or testing in the past.  Resumption of CPAP or similar therapy is strongly recommended if ordered in the past.  Upon review of risk factors or findings positive for sleep apnea, more formal, extensive sleep laboratory or home testing, may be recommended.  Numerous patients, proven to have MAC-TEL, have improved or resolved their ey condition promptly, within weeks, of the use of nighttime oxygen supplementation or continuous positive airway pressure (CPAP).We will not institute therapy for pseudophakic CME at this time as we will await proper fitting and adjustments of her CPAP appliance.  If this compliance  improves and functions well for her, this macular edema and leakage may very well subside in each eye without further therapy      ICD-10-CM   1. Serous retinal detachment of right eye  H33.21 OCT, Retina - OU - Both Eyes  2. Exudative age-related macular degeneration of right eye with active choroidal neovascularization (HCC)  H35.3211 Fluorescein Angiography Optos (Transit OD)    Color Fundus Photography Optos - OU - Both Eyes    Fluorescein Sodium 10 % injection 500 mg  3. Intermediate stage nonexudative age-related macular degeneration of both eyes  H35.3132 Fluorescein Angiography Optos (Transit OD)    Color Fundus Photography Optos - OU - Both Eyes    Fluorescein Sodium 10 % injection 500 mg  4. Cystoid macular edema, right eye  H35.351 OCT, Retina - OU - Both Eyes  5. Vitreomacular adhesion of right eye  H43.821   6. Type 2 macular telangiectasis, right  H35.071     1.  Patient is is receiving new CPAP equipment next couple of days.  Of asked return in 2 weeks hopefully after excellent compliance has been instituted with CPAP and we may very well see these parafoveal macular leakages improve  2.  I explained again to her that there is no signs of active CNVM in either eye.  3.  Typical treatment for MAC-TEL is observation and/or enrollment in a clinical trial which of not  shown benefit to date.  Clinical trials involve injectable medicines into the eye.  Ophthalmic Meds Ordered this visit:  Meds ordered this encounter  Medications  . Fluorescein Sodium 10 % injection 500 mg       Return in about 2 weeks (around 08/26/2020) for OCT, DILATE OU.  There are no Patient Instructions on file for this visit.   Explained the diagnoses, plan, and follow up with the patient and they expressed understanding.  Patient expressed understanding of the importance of proper follow up care.   Clent Demark Rankin M.D. Diseases & Surgery of the Retina and Vitreous [facility] 08/12/20     Abbreviations: M myopia (nearsighted); A astigmatism; H hyperopia (farsighted); P presbyopia; Mrx spectacle prescription;  CTL contact lenses; OD right eye; OS left eye; OU both eyes  XT exotropia; ET esotropia; PEK punctate epithelial keratitis; PEE punctate epithelial erosions; DES dry eye syndrome; MGD meibomian gland dysfunction; ATs artificial tears; PFAT's preservative free artificial tears; [place] nuclear sclerotic cataract; PSC posterior subcapsular cataract; ERM epi-retinal membrane; PVD posterior vitreous detachment; RD retinal detachment; DM diabetes mellitus; DR diabetic retinopathy; NPDR non-proliferative diabetic retinopathy; PDR proliferative diabetic retinopathy; CSME clinically significant macular edema; DME diabetic macular edema; dbh dot blot hemorrhages; CWS cotton wool spot; POAG primary open angle glaucoma; C/D cup-to-disc ratio; HVF humphrey visual field; GVF goldmann visual field; OCT optical coherence tomography; IOP intraocular pressure; BRVO Branch retinal vein occlusion; CRVO central retinal vein occlusion; CRAO central retinal artery occlusion; BRAO branch retinal artery occlusion; RT retinal tear; SB scleral buckle; PPV pars plana vitrectomy; VH Vitreous hemorrhage; PRP panretinal laser photocoagulation; IVK intravitreal kenalog; VMT vitreomacular traction; MH Macular hole;  NVD neovascularization of the disc; NVE neovascularization elsewhere; AREDS age related eye disease study; ARMD age related macular degeneration; POAG primary open angle glaucoma; EBMD epithelial/anterior basement membrane dystrophy; ACIOL anterior chamber intraocular lens; IOL intraocular lens; PCIOL posterior chamber intraocular lens; Phaco/IOL phacoemulsification with intraocular lens placement; [place] photorefractive keratectomy; LASIK laser assisted in situ keratomileusis; HTN hypertension; DM diabetes mellitus; COPD chronic obstructive pulmonary disease

## 2020-08-14 DIAGNOSIS — G4733 Obstructive sleep apnea (adult) (pediatric): Secondary | ICD-10-CM | POA: Diagnosis not present

## 2020-08-26 ENCOUNTER — Ambulatory Visit (INDEPENDENT_AMBULATORY_CARE_PROVIDER_SITE_OTHER): Payer: PPO | Admitting: Ophthalmology

## 2020-08-26 ENCOUNTER — Encounter (INDEPENDENT_AMBULATORY_CARE_PROVIDER_SITE_OTHER): Payer: Self-pay | Admitting: Ophthalmology

## 2020-08-26 ENCOUNTER — Other Ambulatory Visit: Payer: Self-pay

## 2020-08-26 DIAGNOSIS — G4733 Obstructive sleep apnea (adult) (pediatric): Secondary | ICD-10-CM | POA: Diagnosis not present

## 2020-08-26 DIAGNOSIS — H3321 Serous retinal detachment, right eye: Secondary | ICD-10-CM

## 2020-08-26 DIAGNOSIS — H353132 Nonexudative age-related macular degeneration, bilateral, intermediate dry stage: Secondary | ICD-10-CM

## 2020-08-26 DIAGNOSIS — H353211 Exudative age-related macular degeneration, right eye, with active choroidal neovascularization: Secondary | ICD-10-CM | POA: Diagnosis not present

## 2020-08-26 NOTE — Assessment & Plan Note (Signed)
I encourage patient to continue on use of CPAP and making the mask work best she is able.  Also informed her that there are multiple new types of mask including some nasal fits that some folks find easier to comply with and better fit to confirm usage throughout the night

## 2020-08-26 NOTE — Assessment & Plan Note (Signed)
Intermediate age-related macular degeneration with overlying serous retinal detachment of the right eye.  Bilateral macular telangiectasis with central cavitary change confirmed on the left eye with better enhanced use of nightly CPAP and mask fit in the last 2 weeks

## 2020-08-26 NOTE — Assessment & Plan Note (Signed)
No change in serous subfoveal change detachment of the right eye not associated with CNVM by angiography on February 1, thus this is likely associated with MAC-TEL and will continue to observe this condition as patient continues with enhance compliance using her CPAP device

## 2020-08-26 NOTE — Progress Notes (Signed)
08/26/2020     CHIEF COMPLAINT Patient presents for Retina Follow Up (2 Week F/U OU//Pt c/o new "dent" OD, instead of a wave. Pt denies any other changes to [place] OU. No ocular pain, flashes, or floaters OU.)   HISTORY OF PRESENT ILLNESS: [name] is a 70 y.o. female who presents to the clinic today for:   HPI    Retina Follow Up    Patient presents with  Other.  In both eyes.  This started 2 weeks ago.  Severity is mild.  Duration of 2 weeks.  Since onset it is stable. Additional comments: 2 Week F/U OU  Pt c/o new "dent" OD, instead of a wave. Pt denies any other changes to [place] OU. No ocular pain, flashes, or floaters OU.       Last edited by Rockie Neighbours, [place] on 08/26/2020  8:19 AM. (History)      Referring physician: Kathyrn Drown, MD [address] [place],  [place] [address]  HISTORICAL INFORMATION:   Selected notes from the MEDICAL RECORD NUMBER    Lab Results  Component Value Date   HGBA1C 6.0 (H) 02/08/2020     CURRENT MEDICATIONS: No current outpatient medications on file. (Ophthalmic Drugs)   No current facility-administered medications for this visit. (Ophthalmic Drugs)   Current Outpatient Medications (Other)  Medication Sig  . acetaminophen (TYLENOL) 650 MG CR tablet Take 650-1,300 mg by mouth See admin instructions. 1300MG IN THE MORNING AND 650MG AT BEDTIME  . albuterol (VENTOLIN HFA) 108 (90 Base) MCG/ACT inhaler Inhale 1-2 puffs into the lungs every 6 (six) hours as needed for wheezing or shortness of breath.  . ALPRAZolam (XANAX XR) 1 MG 24 hr tablet Take 1 mg by mouth every morning.   [name] ALPRAZolam (XANAX) 1 MG tablet Take 0.5 mg by mouth daily as needed for anxiety.   . calcium carbonate (TUMS - DOSED IN MG ELEMENTAL CALCIUM) 500 MG chewable tablet Chew 1-3 tablets by mouth daily as needed for indigestion or heartburn.  . furosemide (LASIX) 20 MG tablet Take 20 mg by mouth daily as needed for fluid or edema.   [name]  glucosamine-chondroitin 500-400 MG tablet Take 2 tablets by mouth every morning.   . lamoTRIgine (LAMICTAL) 200 MG tablet Take 400 mg by mouth at bedtime.  . Multiple Vitamin (MULTIVITAMIN) tablet Take 1 tablet by mouth daily.  . QUEtiapine (SEROQUEL) 200 MG tablet Take 400 mg by mouth at bedtime.  [name] telmisartan (MICARDIS) 40 MG tablet Take 40 mg by mouth daily.  . Vitamin D, Ergocalciferol, (DRISDOL) 50000 units CAPS capsule Take 50,000 Units by mouth every Sunday.    No current facility-administered medications for this visit. (Other)      REVIEW OF SYSTEMS:    ALLERGIES Allergies  Allergen Reactions  . Imipramine Hives and Rash  . Tricor [Fenofibrate] Other (See Comments)    Pain, "aching"    PAST MEDICAL HISTORY Past Medical History:  Diagnosis Date  . Anxiety   . Bipolar 1 disorder ([place])   . 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 Breast cancer, left breast ([place]) 07/22/2011  . Cancer ([place])   . CHF (congestive heart failure) ([place])   . Depression   . High cholesterol   . Hypertension   . IBS (irritable bowel syndrome)   . Impaired fasting glucose   . Memory loss   . OA (osteoarthritis)   . Obesity   . Personal  history of chemotherapy 2009  . Personal history of radiation therapy 2009  . Polycystic ovarian disease   . Prediabetes 02/10/2020  . 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 Sleep apnea    Past Surgical History:  Procedure Laterality Date  . ABDOMINAL HYSTERECTOMY    . APPENDECTOMY    . BREAST BIOPSY Left 2011  . BREAST BIOPSY  2008  . BREAST LUMPECTOMY Left 2008  . CHOLECYSTECTOMY    . COLONOSCOPY N/A 09/13/2013   Procedure: COLONOSCOPY;  Surgeon: [name], MD;  [facility];  Service: Endoscopy;  Laterality: N/A;  730  . EP IMPLANTABLE DEVICE N/A 01/06/2016   Procedure: Pacemaker Implant;  Surgeon: [name], MD;  Location: [facility];  Service: Cardiovascular;   Laterality: N/A;  . ESOPHAGOGASTRODUODENOSCOPY N/A 01/17/2014   Procedure: ESOPHAGOGASTRODUODENOSCOPY (EGD);  Surgeon: [name], MD;  [facility];  Service: Endoscopy;  Laterality: N/A;  245  . RIGHT/LEFT HEART CATH AND CORONARY ANGIOGRAPHY N/A 01/01/2020   Procedure: RIGHT/LEFT HEART CATH AND CORONARY ANGIOGRAPHY;  Surgeon: [name], MD;  Location: [facility];  Service: Cardiovascular;  Laterality: N/A;  . TOTAL KNEE ARTHROPLASTY Bilateral right knee   2012, 2007    FAMILY HISTORY Family History  Problem Relation Age of Onset  . Bipolar disorder Mother   . Diabetes Mother   . Bipolar disorder Sister   . Diabetes Sister   . Alcohol abuse Father   . Hypertension Father   . Heart disease Other     SOCIAL HISTORY Social History   Tobacco Use  . Smoking status: Never Smoker  . Smokeless tobacco: Never Used  Vaping Use  . Vaping Use: Never used  Substance Use Topics  . Alcohol use: No    Alcohol/week: 0.0 standard drinks  . Drug use: No         OPHTHALMIC EXAM: Base Eye Exam    Visual Acuity (ETDRS)      Right Left   Dist [place] 20/50 +2 20/25 +2   Dist ph [place] 20/30 -2        Tonometry (Tonopen, 8:20 AM)      Right Left   Pressure 09 10       Pupils      Pupils Dark Light Shape React APD   Right PERRL 5 4 Round Brisk None   Left PERRL 5 4 Round Brisk None       Visual Fields (Counting fingers)      Left Right    Full Full       Extraocular Movement      Right Left    Full Full       Neuro/Psych    Oriented x3: Yes   Mood/Affect: Normal       Dilation    Both eyes: 1.0% Mydriacyl, 2.5% Phenylephrine @ 8:23 AM        Slit Lamp and Fundus Exam    External Exam      Right Left   External Normal Normal       Slit Lamp Exam      Right Left   Lids/Lashes Normal Normal   Conjunctiva/Sclera White and quiet White and quiet   Cornea Clear Clear   Anterior Chamber Deep and quiet Deep and quiet   Iris Round and  reactive Round and reactive   Lens Centered posterior chamber intraocular lens Centered posterior chamber intraocular lens   Anterior Vitreous Normal  Normal       Fundus Exam      Right Left   Posterior Vitreous Normal Normal   Disc Normal Normal   C/D Ratio 0.1 0.2   Macula 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, Soft drusen, pigment over drusen INF,  no macular thickening, no membrane, no hemorrhage 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, Soft drusen, no macular thickening, no membrane, no hemorrhage   Vessels Normal Normal   Periphery Normal Normal          IMAGING AND PROCEDURES  Imaging and Procedures for 08/26/20  OCT, Retina - OU - Both Eyes       Right Eye Quality was good. Scan locations included subfoveal. Central Foveal Thickness: 406. Progression has been stable. Findings include vitreomacular adhesion , subretinal fluid, retinal drusen , cystoid macular edema, abnormal foveal contour.   Left Eye Quality was good. Scan locations included subfoveal. Central Foveal Thickness: 320. Progression has improved. Findings include vitreomacular adhesion , retinal drusen , abnormal foveal contour, no IRF, no SRF, cystoid macular edema.   Notes OD with micro-CME in the temporal aspect of the fovea with a serous foveal elevation.  Vitreomacular adhesion with no real inner foveal traction is noted incidentally.  Additionally there is also micro-CME on the superonasal aspect of the foveal avascular zone  OS with what could be described as micro-CME temporally yet no overt thickening.  OS now with central small cavitary change in the foveal location again consistent with MAC-TEL Yet the center thickness is overall diminished in the left eye                ASSESSMENT/PLAN:  Intermediate stage nonexudative age-related macular degeneration of both eyes Intermediate age-related macular degeneration with overlying serous retinal detachment of the right eye.   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  Also informed her that there are multiple new types of mask including some nasal fits that some folks find easier to comply with and better fit to confirm usage throughout the night      ICD-10-CM   1. Exudative age-related macular degeneration of right eye with active choroidal neovascularization (HCC)  H35.3211 OCT, Retina - OU - Both Eyes  2. Intermediate stage nonexudative age-related macular degeneration of both eyes  H35.3132   3. Serous retinal detachment of right eye  H33.21   4. Obstructive sleep apnea of adult  G47.33     1.  We will continue to monitor closely.  No signs of wet ARMD.  2.  Small subfoveal serous detachment associated with multifocal leakage in the perifoveal capillaries noted by fluorescein angiogram 2 weeks prior, consistent with MAC-TEL, particularly with central inner retinal cavitary pseudocystoid change OS.  3.  I have encouraged all attempts be made an excellent compliance with use of nightly CPAP mask and fitting.  Ophthalmic Meds Ordered this visit:  No orders of the defined types were placed in this encounter.  Return in about 6 weeks (around 10/07/2020) for COLOR FP, OCT.  There are no Patient Instructions on file for this visit.   Explained the diagnoses, plan, and follow up with the patient and they expressed understanding.  Patient expressed  understanding of the importance of proper follow up care.   Clent Demark Izaha Shughart M.D. Diseases & Surgery of the Retina and Vitreous [facility]ina [facility] Diabetic [place] 08/26/20     Abbreviations: M myopia (nearsighted); A astigmatism; H hyperopia (farsighted); P presbyopia; Mrx spectacle prescription;  CTL contact lenses; OD right eye; OS left eye; OU both eyes  XT exotropia; ET esotropia; PEK punctate epithelial keratitis; PEE punctate epithelial erosions; DES dry eye syndrome; MGD meibomian gland dysfunction; ATs artificial tears; PFAT's preservative free artificial tears; [place] nuclear sclerotic cataract; PSC posterior subcapsular cataract; ERM epi-retinal membrane; PVD posterior vitreous detachment; RD retinal detachment; DM diabetes mellitus; DR diabetic retinopathy; NPDR non-proliferative diabetic retinopathy; PDR proliferative diabetic retinopathy; CSME clinically significant macular edema; DME diabetic macular edema; dbh dot blot hemorrhages; CWS cotton wool spot; POAG primary open angle glaucoma; C/D cup-to-disc ratio; HVF humphrey visual field; GVF goldmann visual field; OCT optical coherence tomography; IOP intraocular pressure; BRVO Branch retinal vein occlusion; CRVO central retinal vein occlusion; CRAO central retinal artery occlusion; BRAO branch retinal artery occlusion; RT retinal tear; SB scleral buckle; PPV pars plana vitrectomy; VH Vitreous hemorrhage; PRP panretinal laser photocoagulation; IVK intravitreal kenalog; VMT vitreomacular traction; MH Macular hole;  NVD neovascularization of the disc; NVE neovascularization elsewhere; AREDS age related eye disease study; ARMD age related macular degeneration; POAG primary open angle glaucoma; EBMD epithelial/anterior basement membrane dystrophy; ACIOL anterior chamber intraocular lens; IOL intraocular lens; PCIOL posterior chamber intraocular lens; Phaco/IOL phacoemulsification with intraocular lens placement; [place] photorefractive keratectomy; LASIK laser  assisted in situ keratomileusis; HTN hypertension; DM diabetes mellitus; COPD chronic obstructive pulmonary disease

## 2020-08-27 ENCOUNTER — Ambulatory Visit: Payer: PPO | Admitting: Cardiology

## 2020-08-27 ENCOUNTER — Encounter: Payer: Self-pay | Admitting: Cardiology

## 2020-08-27 VITALS — BP 136/84 | HR 76 | Ht 64.0 in | Wt 284.0 lb

## 2020-08-27 DIAGNOSIS — I10 Essential (primary) hypertension: Secondary | ICD-10-CM | POA: Diagnosis not present

## 2020-08-27 DIAGNOSIS — I6523 Occlusion and stenosis of bilateral carotid arteries: Secondary | ICD-10-CM | POA: Diagnosis not present

## 2020-08-27 DIAGNOSIS — I495 Sick sinus syndrome: Secondary | ICD-10-CM | POA: Diagnosis not present

## 2020-08-27 DIAGNOSIS — I6521 Occlusion and stenosis of right carotid artery: Secondary | ICD-10-CM

## 2020-08-27 NOTE — Patient Instructions (Addendum)
Medication Instructions:  Your physician recommends that you continue on your current medications as directed. Please refer to the Current Medication list given to you today.  *If you need a refill on your cardiac medications before your next appointment, please call your pharmacy*   Lab Work: None today  If you have labs (blood work) drawn today and your tests are completely normal, you will receive your results only by: [name] MyChart Message (if you have MyChart) OR . A paper copy in the mail If you have any lab test that is abnormal or we need to change your treatment, we will call you to review the results.   Testing/Procedures: Carotid Artery Ultra sound in August 2022   Follow-Up: At [facility], you and your health needs are our priority.  As part of our continuing mission to provide you with exceptional heart care, we have created designated Provider Care Teams.  These Care Teams include your primary Cardiologist (physician) and Advanced Practice Providers (APPs -  Physician Assistants and Nurse Practitioners) who all work together to provide you with the care you need, when you need it.  We recommend signing up for the patient portal called "MyChart".  Sign up information is provided on this After Visit Summary.  MyChart is used to connect with patients for Virtual Visits (Telemedicine).  Patients are able to view lab/test results, encounter notes, upcoming appointments, etc.  Non-urgent messages can be sent to your provider as well.   To learn more about what you can do with MyChart, go to [email or web address].    Your next appointment:   12 month(s)  The format for your next appointment:   In Person  Provider:   Rozann Lesches, MD   Other Instructions None         Thank you for choosing [facility] !

## 2020-08-27 NOTE — Progress Notes (Signed)
Cardiology Office Note  Date: 08/27/2020   ID: [name], [place] 1951/04/08, MRN [id number]  PCP:  Kathyrn Drown, MD  Cardiologist:  Rozann Lesches, MD Electrophysiologist:  Cristopher Peru, MD   Chief Complaint  Patient presents with  . Cardiac follow-up    History of Present Illness: [name] is a 70 y.o. female former patient of Dr. Bronson Ing now presenting to establish follow-up with me.  I reviewed her records and updated the chart.  She was last seen by Dr. Lovena Le in September 2021.  She follows with Dr. Lovena Le, [place] pacemaker in place with history of sinus node dysfunction.  Device interrogation in December 2021 revealed normal function.  She had a fairly extensive cardiac work-up last year for evaluation of shortness of breath. Echocardiogram revealed vigorous LVEF at 65 to 70% with only mild diastolic dysfunction. She underwent a right and left heart catheterization in June 2021 that revealed normal pressures and normal coronary arteries. We discussed the studies today. Main focus at this time is control of blood pressure and also exercise. She tells me that she has been going to the [facility] 4 days a week, does treadmill and elliptical machine, 30 minutes each. She has been feeling better.   Past Medical History:  Diagnosis Date  . Anxiety   . Bipolar 1 disorder ([place])   . Breast cancer, left breast ([place]) 07/22/2011  . Depression   . Essential hypertension   . Hyperlipidemia   . IBS (irritable bowel syndrome)   . Memory loss   . OA (osteoarthritis)   . Obesity   . Personal history of chemotherapy 2009  . Personal history of radiation therapy 2009  . Polycystic ovarian disease   . Prediabetes   . Sinus node dysfunction (HCC) 12/2015   Biotronik pacemaker - Dr. Lovena Le  . Sleep apnea     Past Surgical History:  Procedure Laterality Date  . ABDOMINAL HYSTERECTOMY    . APPENDECTOMY    . BREAST BIOPSY Left 2011  . BREAST BIOPSY  2008  .  BREAST LUMPECTOMY Left 2008  . CHOLECYSTECTOMY    . COLONOSCOPY N/A 09/13/2013   Procedure: COLONOSCOPY;  Surgeon: [name], MD;  [facility];  Service: Endoscopy;  Laterality: N/A;  730  . EP IMPLANTABLE DEVICE N/A 01/06/2016   Procedure: Pacemaker Implant;  Surgeon: [name], MD;  Location: [facility];  Service: Cardiovascular;  Laterality: N/A;  . ESOPHAGOGASTRODUODENOSCOPY N/A 01/17/2014   Procedure: ESOPHAGOGASTRODUODENOSCOPY (EGD);  Surgeon: [name], MD;  [facility];  Service: Endoscopy;  Laterality: N/A;  245  . RIGHT/LEFT HEART CATH AND CORONARY ANGIOGRAPHY N/A 01/01/2020   Procedure: RIGHT/LEFT HEART CATH AND CORONARY ANGIOGRAPHY;  Surgeon: [name], MD;  Location: [facility];  Service: Cardiovascular;  Laterality: N/A;  . TOTAL KNEE ARTHROPLASTY Bilateral right knee   2012, 2007    Current Outpatient Medications  Medication Sig Dispense Refill  . acetaminophen (TYLENOL) 650 MG CR tablet Take 650-1,300 mg by mouth See admin instructions. 1300MG  IN THE MORNING AND 650MG  AT BEDTIME    . albuterol (VENTOLIN HFA) 108 (90 Base) MCG/ACT inhaler Inhale 1-2 puffs into the lungs every 6 (six) hours as needed for wheezing or shortness of breath. 18 g 0  . ALPRAZolam (XANAX XR) 1 MG 24 hr tablet Take 1 mg by mouth every morning.     [name] ALPRAZolam (XANAX) 1 MG tablet Take 0.5 mg by mouth  daily as needed for anxiety.     . calcium carbonate (TUMS - DOSED IN MG ELEMENTAL CALCIUM) 500 MG chewable tablet Chew 1-3 tablets by mouth daily as needed for indigestion or heartburn.    [name] glucosamine-chondroitin 500-400 MG tablet Take 2 tablets by mouth every morning.     . lamoTRIgine (LAMICTAL) 200 MG tablet Take 400 mg by mouth at bedtime.    . Multiple Vitamin (MULTIVITAMIN) tablet Take 1 tablet by mouth daily.    . QUEtiapine (SEROQUEL) 200 MG tablet Take 600 mg by mouth at bedtime.    [name] telmisartan (MICARDIS) 20 MG tablet     .  Vitamin D, Ergocalciferol, (DRISDOL) 50000 units CAPS capsule Take 50,000 Units by mouth every Sunday.      No current facility-administered medications for this visit.   Allergies:  Imipramine and Tricor [fenofibrate]   ROS: No palpitations or syncope.  Physical Exam: VS:  BP 136/84   Pulse 76   Ht 5\' 4"  (1.626 m)   Wt 284 lb (128.8 kg)   SpO2 97%   BMI 48.75 kg/m , BMI Body mass index is 48.75 kg/m.  Wt Readings from Last 3 Encounters:  08/27/20 284 lb (128.8 kg)  07/09/20 272 lb 3.2 oz (123.5 kg)  06/19/20 271 lb 9.6 oz (123.2 kg)    General: Patient appears comfortable at rest. HEENT: Conjunctiva and lids normal, wearing a mask. Neck: Supple, no elevated JVP or carotid bruits, no thyromegaly. Lungs: Clear to auscultation, nonlabored breathing at rest. Cardiac: Regular rate and rhythm, no S3 or significant systolic murmur, no pericardial rub. Extremities: No pitting edema.  ECG:  An ECG dated 12/31/2019 was personally reviewed today and demonstrated:  Atrial paced rhythm with increased voltage.  Recent Labwork: 12/04/2019: B Natriuretic Peptide 46.0 01/02/2020: Hemoglobin 13.4; Platelets 248 01/15/2020: TSH 1.580 06/30/2020: ALT 18; AST 19; BUN 14; Creatinine, Ser 0.83; Potassium 4.9; Sodium 146     Component Value Date/Time   CHOL 176 06/30/2020 0848   TRIG 246 (H) 06/30/2020 0848   HDL 37 (L) 06/30/2020 0848   CHOLHDL 4.8 (H) 06/30/2020 0848   CHOLHDL 5.0 02/06/2020 0859   VLDL 46 (H) 02/06/2020 0859   LDLCALC 97 06/30/2020 0848    Other Studies Reviewed Today:  Echocardiogram 12/05/2019: 1. Left ventricular ejection fraction, by estimation, is 65 to 70%. The  left ventricle has normal function. The left ventricle has no regional  wall motion abnormalities. There is mild left ventricular hypertrophy.  Left ventricular diastolic parameters  are consistent with Grade I diastolic dysfunction (impaired relaxation).  2. Right ventricular systolic function is  normal. The right ventricular  size is normal. Tricuspid regurgitation signal is inadequate for assessing  PA pressure.  3. The mitral valve is grossly normal. Trivial mitral valve  regurgitation.  4. The aortic valve is tricuspid. Aortic valve regurgitation is not  visualized.  5. The inferior vena cava is normal in size with greater than 50%  respiratory variability, suggesting right atrial pressure of 3 mmHg.   Right and left heart catheterization 01/01/2020: Flowsheet Row Most Recent Value  Fick Cardiac Output 6.71 L/min  Fick Cardiac Output Index 2.97 (L/min)/BSA  RA A Wave 4 mmHg  RA V Wave 2 mmHg  RA Mean 1 mmHg  RV Systolic Pressure 30 mmHg  RV Diastolic Pressure 2 mmHg  RV EDP 5 mmHg  PA Systolic Pressure 34 mmHg  PA Diastolic Pressure 12 mmHg  PA Mean 20 mmHg  PW A Wave 9 mmHg  PW V Wave 9 mmHg  PW Mean 7 mmHg  AO Systolic Pressure 174 mmHg  AO Diastolic Pressure 74 mmHg  AO Mean 944 mmHg  LV Systolic Pressure 967 mmHg  LV Diastolic Pressure 6 mmHg  LV EDP 9 mmHg  AOp Systolic Pressure 591 mmHg  AOp Diastolic Pressure 75 mmHg  AOp Mean Pressure 638 mmHg  LVp Systolic Pressure 466 mmHg  LVp Diastolic Pressure 2 mmHg  LVp EDP Pressure 7 mmHg  QP/QS 1  TPVR Index 6.72 HRUI  TSVR Index 35.65 HRUI  PVR SVR Ratio 0.12  TPVR/TSVR Ratio 0.19   Normal coronary arteries Normal filling pressures  No further ischemic workup  Carotid Dopplers 03/03/2020: IMPRESSION: Right:  Heterogeneous plaque at the right carotid bifurcation, with discordant results regarding degree of stenosis by established duplex criteria. Peak velocity suggests 50%-69% stenosis, with the ICA/ CCA ratio suggesting a lesser degree of stenosis. If establishing a more accurate degree of stenosis is required, cerebral angiogram should be considered, or as a second best test, CTA.  Left:  Color duplex indicates minimal heterogeneous plaque, with no hemodynamically significant stenosis  by duplex criteria in the extracranial cerebrovascular circulation.  Assessment and Plan:  1. Sinus node dysfunction status post Biotronik pacemaker. She follows with Dr. Lovena Le, device function normal at most recent interrogation.  2. History of shortness of breath. She had a reassuring cardiac work-up last year including echocardiogram and cardiac catheterization, noted above. Would continue with efforts at blood pressure control, tolerating Micardis. Agree with exercise plan. She states that she is feeling better.  3. Carotid artery disease, moderate RICA stenosis based on Dopplers from last August. We will obtain a follow-up study this year. Currently not on statin therapy.  Medication Adjustments/Labs and Tests Ordered: Current medicines are reviewed at length with the patient today.  Concerns regarding medicines are outlined above.   Tests Ordered: Orders Placed This Encounter  Procedures  . US Carotid Duplex Bilateral    Medication Changes: No orders of the defined types were placed in this encounter.   Disposition:  Follow up 1 year in the [place] office.  Signed, [name], MD, [facility] 08/27/2020 12:03 PM    [facility] [address], [place], [place] [address] Phone: [phone]; Fax: [phone]

## 2020-09-11 DIAGNOSIS — G4733 Obstructive sleep apnea (adult) (pediatric): Secondary | ICD-10-CM | POA: Diagnosis not present

## 2020-09-15 ENCOUNTER — Encounter (INDEPENDENT_AMBULATORY_CARE_PROVIDER_SITE_OTHER): Payer: PPO | Admitting: Ophthalmology

## 2020-09-23 DIAGNOSIS — H353210 Exudative age-related macular degeneration, right eye, stage unspecified: Secondary | ICD-10-CM | POA: Diagnosis not present

## 2020-09-23 DIAGNOSIS — Z961 Presence of intraocular lens: Secondary | ICD-10-CM | POA: Diagnosis not present

## 2020-09-24 ENCOUNTER — Ambulatory Visit (INDEPENDENT_AMBULATORY_CARE_PROVIDER_SITE_OTHER): Payer: PPO | Admitting: Ophthalmology

## 2020-09-24 ENCOUNTER — Other Ambulatory Visit: Payer: Self-pay

## 2020-09-24 ENCOUNTER — Encounter (INDEPENDENT_AMBULATORY_CARE_PROVIDER_SITE_OTHER): Payer: Self-pay | Admitting: Ophthalmology

## 2020-09-24 DIAGNOSIS — H35071 Retinal telangiectasis, right eye: Secondary | ICD-10-CM

## 2020-09-24 DIAGNOSIS — H35351 Cystoid macular degeneration, right eye: Secondary | ICD-10-CM

## 2020-09-24 DIAGNOSIS — G4733 Obstructive sleep apnea (adult) (pediatric): Secondary | ICD-10-CM | POA: Diagnosis not present

## 2020-09-24 DIAGNOSIS — H353211 Exudative age-related macular degeneration, right eye, with active choroidal neovascularization: Secondary | ICD-10-CM

## 2020-09-24 NOTE — Assessment & Plan Note (Signed)
Likely improving as defined by OCT on temporal aspect of macula OD

## 2020-09-24 NOTE — Assessment & Plan Note (Signed)
Less CME in the classic area temporal to the fovea OD on excellent CPAP compliance for the last 4 weeks

## 2020-09-24 NOTE — Assessment & Plan Note (Signed)
As of this date patient has had excellent fitting mask now for the last 4 weeks.  Prior to that was having difficulty having a successful night wearing the CPAP mask

## 2020-09-24 NOTE — Progress Notes (Signed)
09/24/2020     CHIEF COMPLAINT Patient presents for Retina Follow Up (4 Week f\u OU. OCT/Pt c/o TV looking concaved with OU open. Pt states she sees wavy lines with OU open as well. Pt has seen new FOL in outside corner of OS vision x 1 week. Pt also noticed an "X" in OD vision.)   HISTORY OF PRESENT ILLNESS: [name] is a 70 y.o. female who presents to the clinic today for:   HPI    Retina Follow Up    Patient presents with  Wet AMD.  In right eye.  Severity is moderate.  Duration of 4 weeks.  Since onset it is stable.  I, the attending physician,  performed the HPI with the patient and updated documentation appropriately. Additional comments: 4 Week f\u OU. OCT Pt c/o TV looking concaved with OU open. Pt states she sees wavy lines with OU open as well. Pt has seen new FOL in outside corner of OS vision x 1 week. Pt also noticed an "X" in OD vision.          Comments    Patient has had excellent fitting of her CPAP mask and usage nightly for the last 4 weeks as compared to her prior examinations.       Last edited by Hurman Horn, MD on 09/24/2020  2:45 PM. (History)      Referring physician: Kathyrn Drown, MD [address] [place],  [place] [address]  HISTORICAL INFORMATION:   Selected notes from the MEDICAL RECORD NUMBER    Lab Results  Component Value Date   HGBA1C 6.0 (H) 02/08/2020     CURRENT MEDICATIONS: No current outpatient medications on file. (Ophthalmic Drugs)   No current facility-administered medications for this visit. (Ophthalmic Drugs)   Current Outpatient Medications (Other)  Medication Sig  . acetaminophen (TYLENOL) 650 MG CR tablet Take 650-1,300 mg by mouth See admin instructions. 1300MG  IN THE MORNING AND 650MG  AT BEDTIME  . albuterol (VENTOLIN HFA) 108 (90 Base) MCG/ACT inhaler Inhale 1-2 puffs into the lungs every 6 (six) hours as needed for wheezing or shortness of breath.  . ALPRAZolam (XANAX XR) 1 MG 24 hr tablet Take  1 mg by mouth every morning.   [name] ALPRAZolam (XANAX) 1 MG tablet Take 0.5 mg by mouth daily as needed for anxiety.   . calcium carbonate (TUMS - DOSED IN MG ELEMENTAL CALCIUM) 500 MG chewable tablet Chew 1-3 tablets by mouth daily as needed for indigestion or heartburn.  [name] glucosamine-chondroitin 500-400 MG tablet Take 2 tablets by mouth every morning.   . lamoTRIgine (LAMICTAL) 200 MG tablet Take 400 mg by mouth at bedtime.  . Multiple Vitamin (MULTIVITAMIN) tablet Take 1 tablet by mouth daily.  . QUEtiapine (SEROQUEL) 200 MG tablet Take 600 mg by mouth at bedtime.  [name] telmisartan (MICARDIS) 20 MG tablet   . Vitamin D, Ergocalciferol, (DRISDOL) 50000 units CAPS capsule Take 50,000 Units by mouth every Sunday.    No current facility-administered medications for this visit. (Other)      REVIEW OF SYSTEMS:    ALLERGIES Allergies  Allergen Reactions  . Imipramine Hives and Rash  . Tricor [Fenofibrate] Other (See Comments)    Pain, "aching"    PAST MEDICAL HISTORY Past Medical History:  Diagnosis Date  . Anxiety   . Bipolar 1 disorder ([place])   . Breast cancer, left breast ([facility]) 07/22/2011  . Depression   . Essential hypertension   .  Hyperlipidemia   . IBS (irritable bowel syndrome)   . Memory loss   . OA (osteoarthritis)   . Obesity   . Personal history of chemotherapy 2009  . Personal history of radiation therapy 2009  . Polycystic ovarian disease   . Prediabetes   . Sinus node dysfunction (HCC) 12/2015   Biotronik pacemaker - Dr. Lovena Le  . Sleep apnea    Past Surgical History:  Procedure Laterality Date  . ABDOMINAL HYSTERECTOMY    . APPENDECTOMY    . BREAST BIOPSY Left 2011  . BREAST BIOPSY  2008  . BREAST LUMPECTOMY Left 2008  . CHOLECYSTECTOMY    . COLONOSCOPY N/A 09/13/2013   Procedure: COLONOSCOPY;  Surgeon: [name], MD;  [facility];  Service: Endoscopy;  Laterality: N/A;  730  . EP IMPLANTABLE DEVICE N/A 01/06/2016   Procedure: Pacemaker  Implant;  Surgeon: [name], MD;  Location: [facility];  Service: Cardiovascular;  Laterality: N/A;  . ESOPHAGOGASTRODUODENOSCOPY N/A 01/17/2014   Procedure: ESOPHAGOGASTRODUODENOSCOPY (EGD);  Surgeon: [name], MD;  [facility];  Service: Endoscopy;  Laterality: N/A;  245  . RIGHT/LEFT HEART CATH AND CORONARY ANGIOGRAPHY N/A 01/01/2020   Procedure: RIGHT/LEFT HEART CATH AND CORONARY ANGIOGRAPHY;  Surgeon: [name], MD;  Location: [facility];  Service: Cardiovascular;  Laterality: N/A;  . TOTAL KNEE ARTHROPLASTY Bilateral right knee   2012, 2007    FAMILY HISTORY Family History  Problem Relation Age of Onset  . Bipolar disorder Mother   . Diabetes Mother   . Bipolar disorder Sister   . Diabetes Sister   . Alcohol abuse Father   . Hypertension Father   . Heart disease Other     SOCIAL HISTORY Social History   Tobacco Use  . Smoking status: Never Smoker  . Smokeless tobacco: Never Used  Vaping Use  . Vaping Use: Never used  Substance Use Topics  . Alcohol use: No    Alcohol/week: 0.0 standard drinks  . Drug use: No         OPHTHALMIC EXAM:  Base Eye Exam    Visual Acuity (Snellen - Linear)      Right Left   Dist [place] 20/50 20/30   Dist ph [place] 20/40 -1        Tonometry (Tonopen, 1:37 PM)      Right Left   Pressure 9 13       Pupils      Pupils Dark Light Shape React APD   Right PERRL 5 4 Round Brisk None   Left PERRL 5 4 Round Brisk None       Visual Fields (Counting fingers)      Left Right    Full Full       Neuro/Psych    Oriented x3: Yes   Mood/Affect: Normal       Dilation    Both eyes: 1.0% Mydriacyl, 2.5% Phenylephrine @ 1:37 PM        Slit Lamp and Fundus Exam    External Exam      Right Left   External Normal Normal       Slit Lamp Exam      Right Left   Lids/Lashes Normal Normal   Conjunctiva/Sclera White and quiet White and quiet   Cornea Clear Clear   Anterior Chamber Deep  and quiet Deep and quiet   Iris Round and reactive Round and reactive   Lens Centered posterior  chamber intraocular lens Centered posterior chamber intraocular lens   Anterior Vitreous Normal Normal       Fundus Exam      Right Left   Posterior Vitreous Normal Normal   Disc Normal Normal   C/D Ratio 0.1 0.2   Macula 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, Soft drusen, pigment over drusen INF,  no macular thickening, no membrane, no hemorrhage 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, Soft drusen, no macular thickening, no membrane, no hemorrhage   Vessels Normal Normal   Periphery Normal no holes or tears, 28 and 20 diopter lens Normal no holes or tears, 28 and 20 diopter lens          IMAGING AND PROCEDURES  Imaging and Procedures for 09/24/20  OCT, Retina - OU - Both Eyes       Right Eye Quality was good. Scan locations included subfoveal. Central Foveal Thickness: 384. Progression has improved. Findings include retinal drusen , abnormal foveal contour.   Left Eye Quality was good. Scan locations included subfoveal. Central Foveal Thickness: 323. Progression has been stable. Findings include retinal drusen , abnormal foveal contour, epiretinal membrane.   Notes As compared to initial OCT August 12, 2020, OD has no residual CME on the temporal aspect of the macula.  Moreover the central subfoveal serous elevation is also beginning to subside and less thickening centrally OD  Coincidentally the findings above coincide with excellent compliance of CPAP use nightly over the last 4 weeks  OS no change in configuration or anatomy                ASSESSMENT/PLAN:  Obstructive sleep apnea of adult As of this date patient has had excellent fitting mask now for the last 4 weeks.  Prior to that was having difficulty having a successful night wearing the CPAP mask  Cystoid macular edema, right eye Likely improving as defined by OCT on temporal aspect of macula  OD  Type 2 macular telangiectasis, right Less CME in the classic area temporal to the fovea OD on excellent CPAP compliance for the last 4 weeks      ICD-10-CM   1. Exudative age-related macular degeneration of right eye with active choroidal neovascularization (HCC)  H35.3211 OCT, Retina - OU - Both Eyes  2. Obstructive sleep apnea of adult  G47.33   3. Cystoid macular edema, right eye  H35.351   4. Type 2 macular telangiectasis, right  H35.071     1.  2.  3.  Ophthalmic Meds Ordered this visit:  No orders of the defined types were placed in this encounter.      Return in about 2 weeks (around 10/08/2020) for DILATE OU, OPTOS FFA R/L, COLOR FP, OCT.  There are no Patient Instructions on file for this visit.   Explained the diagnoses, plan, and follow up with the patient and they expressed understanding.  Patient expressed understanding of the importance of proper follow up care.   Clent Demark Rankin M.D. Diseases & Surgery of the Retina and Vitreous [facility] 09/24/20     Abbreviations: M myopia (nearsighted); A astigmatism; H hyperopia (farsighted); P presbyopia; Mrx spectacle prescription;  CTL contact lenses; OD right eye; OS left eye; OU both eyes  XT exotropia; ET esotropia; PEK punctate epithelial keratitis; PEE punctate epithelial erosions; DES dry eye syndrome; MGD meibomian gland dysfunction; ATs artificial tears; PFAT's preservative free artificial tears; [place] nuclear sclerotic cataract; PSC posterior subcapsular cataract; ERM epi-retinal membrane; PVD posterior vitreous detachment;  RD retinal detachment; DM diabetes mellitus; DR diabetic retinopathy; NPDR non-proliferative diabetic retinopathy; PDR proliferative diabetic retinopathy; CSME clinically significant macular edema; DME diabetic macular edema; dbh dot blot hemorrhages; CWS cotton wool spot; POAG primary open angle glaucoma; C/D cup-to-disc ratio; HVF humphrey visual field; GVF goldmann  visual field; OCT optical coherence tomography; IOP intraocular pressure; BRVO Branch retinal vein occlusion; CRVO central retinal vein occlusion; CRAO central retinal artery occlusion; BRAO branch retinal artery occlusion; RT retinal tear; SB scleral buckle; PPV pars plana vitrectomy; VH Vitreous hemorrhage; PRP panretinal laser photocoagulation; IVK intravitreal kenalog; VMT vitreomacular traction; MH Macular hole;  NVD neovascularization of the disc; NVE neovascularization elsewhere; AREDS age related eye disease study; ARMD age related macular degeneration; POAG primary open angle glaucoma; EBMD epithelial/anterior basement membrane dystrophy; ACIOL anterior chamber intraocular lens; IOL intraocular lens; PCIOL posterior chamber intraocular lens; Phaco/IOL phacoemulsification with intraocular lens placement; [place] photorefractive keratectomy; LASIK laser assisted in situ keratomileusis; HTN hypertension; DM diabetes mellitus; COPD chronic obstructive pulmonary disease

## 2020-10-02 ENCOUNTER — Ambulatory Visit (INDEPENDENT_AMBULATORY_CARE_PROVIDER_SITE_OTHER): Payer: PPO

## 2020-10-02 DIAGNOSIS — R001 Bradycardia, unspecified: Secondary | ICD-10-CM

## 2020-10-04 LAB — CUP PACEART REMOTE DEVICE CHECK
Date Time Interrogation Session: 20220324181505
Implantable Lead Implant Date: 20170627
Implantable Lead Implant Date: 20170627
Implantable Lead Location: 753859
Implantable Lead Location: 753860
Implantable Lead Model: 377
Implantable Lead Model: 377
Implantable Lead Serial Number: 49436227
Implantable Lead Serial Number: 49471106
Implantable Pulse Generator Implant Date: 20170627
Pulse Gen Model: 394969
Pulse Gen Serial Number: 68786455

## 2020-10-07 ENCOUNTER — Encounter (INDEPENDENT_AMBULATORY_CARE_PROVIDER_SITE_OTHER): Payer: Self-pay | Admitting: Ophthalmology

## 2020-10-07 ENCOUNTER — Ambulatory Visit (INDEPENDENT_AMBULATORY_CARE_PROVIDER_SITE_OTHER): Payer: PPO | Admitting: Ophthalmology

## 2020-10-07 ENCOUNTER — Other Ambulatory Visit: Payer: Self-pay

## 2020-10-07 DIAGNOSIS — G4733 Obstructive sleep apnea (adult) (pediatric): Secondary | ICD-10-CM

## 2020-10-07 DIAGNOSIS — H353211 Exudative age-related macular degeneration, right eye, with active choroidal neovascularization: Secondary | ICD-10-CM

## 2020-10-07 DIAGNOSIS — H35071 Retinal telangiectasis, right eye: Secondary | ICD-10-CM

## 2020-10-07 DIAGNOSIS — H353132 Nonexudative age-related macular degeneration, bilateral, intermediate dry stage: Secondary | ICD-10-CM

## 2020-10-07 MED ORDER — FLUORESCEIN SODIUM 10 % IV SOLN
500.0000 mg | INTRAVENOUS | Status: AC | PRN
  Administered 2020-10-07: 500 mg via INTRAVENOUS

## 2020-10-07 NOTE — Assessment & Plan Note (Signed)
Perhaps a slightly improved by FFA with late last diffuse leakage but this is a subjective measure and there is no objective measure of this continue to monitor the FFA and OCT as consistent use of CPAP continues

## 2020-10-07 NOTE — Progress Notes (Signed)
10/07/2020     CHIEF COMPLAINT Patient presents for Retina Follow Up (2 Wk FP/FFA R/L//Pt sts OD feels like "grain of sand." Pt reports irritation OD. Pt denies changes to [place] OU since last visit. Pt sts she has been compliant with nightly CPAP use.)   HISTORY OF PRESENT ILLNESS: [name] is a 70 y.o. female who presents to the clinic today for:   HPI    Retina Follow Up    Patient presents with  Wet AMD.  In right eye.  This started 2 weeks ago.  Severity is mild.  Duration of 2 weeks.  Since onset it is stable. Additional comments: 2 Wk FP/FFA R/L  Pt sts OD feels like "grain of sand." Pt reports irritation OD. Pt denies changes to [place] OU since last visit. Pt sts she has been compliant with nightly CPAP use.       Last edited by Rockie Neighbours, [place] on 10/07/2020 10:58 AM. (History)      Referring physician: Kathyrn Drown, MD [address] [place],  [place] [address]  HISTORICAL INFORMATION:   Selected notes from the MEDICAL RECORD NUMBER    Lab Results  Component Value Date   HGBA1C 6.0 (H) 02/08/2020     CURRENT MEDICATIONS: No current outpatient medications on file. (Ophthalmic Drugs)   No current facility-administered medications for this visit. (Ophthalmic Drugs)   Current Outpatient Medications (Other)  Medication Sig  . acetaminophen (TYLENOL) 650 MG CR tablet Take 650-1,300 mg by mouth See admin instructions. 1300MG  IN THE MORNING AND 650MG  AT BEDTIME  . albuterol (VENTOLIN HFA) 108 (90 Base) MCG/ACT inhaler Inhale 1-2 puffs into the lungs every 6 (six) hours as needed for wheezing or shortness of breath.  . ALPRAZolam (XANAX XR) 1 MG 24 hr tablet Take 1 mg by mouth every morning.   [name] ALPRAZolam (XANAX) 1 MG tablet Take 0.5 mg by mouth daily as needed for anxiety.   . calcium carbonate (TUMS - DOSED IN MG ELEMENTAL CALCIUM) 500 MG chewable tablet Chew 1-3 tablets by mouth daily as needed for indigestion or heartburn.  [name]  glucosamine-chondroitin 500-400 MG tablet Take 2 tablets by mouth every morning.   . lamoTRIgine (LAMICTAL) 200 MG tablet Take 400 mg by mouth at bedtime.  . Multiple Vitamin (MULTIVITAMIN) tablet Take 1 tablet by mouth daily.  . QUEtiapine (SEROQUEL) 200 MG tablet Take 600 mg by mouth at bedtime.  [name] telmisartan (MICARDIS) 20 MG tablet   . Vitamin D, Ergocalciferol, (DRISDOL) 50000 units CAPS capsule Take 50,000 Units by mouth every Sunday.    No current facility-administered medications for this visit. (Other)      REVIEW OF SYSTEMS:    ALLERGIES Allergies  Allergen Reactions  . Imipramine Hives and Rash  . Tricor [Fenofibrate] Other (See Comments)    Pain, "aching"    PAST MEDICAL HISTORY Past Medical History:  Diagnosis Date  . Anxiety   . Bipolar 1 disorder ([place])   . Breast cancer, left breast ([facility]) 07/22/2011  . Depression   . Essential hypertension   . Hyperlipidemia   . IBS (irritable bowel syndrome)   . Memory loss   . OA (osteoarthritis)   . Obesity   . Personal history of chemotherapy 2009  . Personal history of radiation therapy 2009  . Polycystic ovarian disease   . Prediabetes   . Sinus node dysfunction (HCC) 12/2015   Biotronik pacemaker - Dr. Lovena Le  . Sleep apnea  Past Surgical History:  Procedure Laterality Date  . ABDOMINAL HYSTERECTOMY    . APPENDECTOMY    . BREAST BIOPSY Left 2011  . BREAST BIOPSY  2008  . BREAST LUMPECTOMY Left 2008  . CHOLECYSTECTOMY    . COLONOSCOPY N/A 09/13/2013   Procedure: COLONOSCOPY;  Surgeon: [name], MD;  [facility];  Service: Endoscopy;  Laterality: N/A;  730  . EP IMPLANTABLE DEVICE N/A 01/06/2016   Procedure: Pacemaker Implant;  Surgeon: [name], MD;  Location: [facility];  Service: Cardiovascular;  Laterality: N/A;  . ESOPHAGOGASTRODUODENOSCOPY N/A 01/17/2014   Procedure: ESOPHAGOGASTRODUODENOSCOPY (EGD);  Surgeon: [name], MD;  [facility];  Service:  Endoscopy;  Laterality: N/A;  245  . RIGHT/LEFT HEART CATH AND CORONARY ANGIOGRAPHY N/A 01/01/2020   Procedure: RIGHT/LEFT HEART CATH AND CORONARY ANGIOGRAPHY;  Surgeon: [name], MD;  Location: [facility];  Service: Cardiovascular;  Laterality: N/A;  . TOTAL KNEE ARTHROPLASTY Bilateral right knee   2012, 2007    FAMILY HISTORY Family History  Problem Relation Age of Onset  . Bipolar disorder Mother   . Diabetes Mother   . Bipolar disorder Sister   . Diabetes Sister   . Alcohol abuse Father   . Hypertension Father   . Heart disease Other     SOCIAL HISTORY Social History   Tobacco Use  . Smoking status: Never Smoker  . Smokeless tobacco: Never Used  Vaping Use  . Vaping Use: Never used  Substance Use Topics  . Alcohol use: No    Alcohol/week: 0.0 standard drinks  . Drug use: No         OPHTHALMIC EXAM: Base Eye Exam    Visual Acuity (ETDRS)      Right Left   Dist [place] 20/50 20/30 -1   Dist ph [place] 20/40 NI       Tonometry (Tonopen, 10:51 AM)      Right Left   Pressure 10 12       Pupils      Pupils Dark Light Shape React APD   Right PERRL 5 4 Round Brisk None   Left PERRL 5 4 Round Brisk None       Visual Fields (Counting fingers)      Left Right    Full Full       Extraocular Movement      Right Left    Full Full       Neuro/Psych    Oriented x3: Yes   Mood/Affect: Normal       Dilation    Both eyes: 1.0% Mydriacyl, 2.5% Phenylephrine @ 10:58 AM        Slit Lamp and Fundus Exam    External Exam      Right Left   External Normal Normal       Slit Lamp Exam      Right Left   Lids/Lashes Normal Normal   Conjunctiva/Sclera White and quiet White and quiet   Cornea Clear Clear   Anterior Chamber Deep and quiet Deep and quiet   Iris Round and reactive Round and reactive   Lens Centered posterior chamber intraocular lens Centered posterior chamber intraocular lens   Anterior Vitreous Normal Normal       Fundus Exam       Right Left   Posterior Vitreous Normal Normal   Disc Normal Normal   C/D Ratio 0.1 0.2   Macula Intermediate age related  macular degeneration, Soft drusen, pigment over drusen INF,  no macular thickening, no membrane, no hemorrhage 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, Soft drusen, no macular thickening, no membrane, no hemorrhage   Vessels Normal Normal   Periphery Normal no holes or tears, 28 and 20 diopter lens Normal no holes or tears, 28 and 20 diopter lens          IMAGING AND PROCEDURES  Imaging and Procedures for 10/07/20  OCT, Retina - OU - Both Eyes       Right Eye Quality was good. Scan locations included subfoveal. Central Foveal Thickness: 385. Findings include abnormal foveal contour, retinal drusen , subretinal fluid.   Left Eye Quality was good. Scan locations included subfoveal. Central Foveal Thickness: 327. Progression has been stable. Findings include retinal drusen , no IRF, no SRF.   Notes Slightly less intraretinal fluid inferior to the foveal zone as compared to OCT's dated 08/12/2020 and 08/26/2020, coincident with an improved overall central field thickness  Now on CPAP consistent use for the last 6 weeks       Color Fundus Photography Optos - OU - Both Eyes       Right Eye Macula : retinal pigment epithelium abnormalities, drusen. Vessels : normal observations. Periphery : normal observations.   Left Eye Progression has no prior data. Disc findings include normal observations. Macula : drusen, retinal pigment epithelium abnormalities. Vessels : normal observations. Periphery : normal observations.   Notes Intermediate age-related macular degeneration by color fundus photography.  Normal optic nerves normal periphery normal media       Fluorescein Angiography Optos (Transit OD)       Injection:  500 mg Fluorescein Sodium 10 % injection   NDC: [phone]   Route: IntravenousRight Eye   Progression has improved. Early  phase findings include window defect. Mid/Late phase findings include leakage. Choroidal neovascularization is not present.   Left Eye Early phase findings include window defect. Mid/Late phase findings include window defect. Choroidal neovascularization is not present.   Notes Patient on CPAP use now 6 weeks consistently with the right fitting mask and headgear.  Careful evaluation discloses the inferior for 30 around to the 9 o'clock position which was leaking extensively on prior FFA 08-12-20 may have a subjective improvement in less discrete micro aneurysmal late leakages in a perifoveal region between the 430 and 9 o'clock position day some 6 weeks post successful CPAP use and coincident with slight decrease in paracentral thickness on OCT                ASSESSMENT/PLAN:  Obstructive sleep apnea of adult Patient continues to be compliant with CPAP use.  Type 2 macular telangiectasis, right Perhaps a slightly improved by FFA with late last diffuse leakage but this is a subjective measure and there is no objective measure of this continue to monitor the FFA and OCT as consistent use of CPAP continues  Intermediate stage nonexudative age-related macular degeneration of both eyes No signs of CNVM will observe      ICD-10-CM   1. Exudative age-related macular degeneration of right eye with active choroidal neovascularization (HCC)  H35.3211 OCT, Retina - OU - Both Eyes    Color Fundus Photography Optos - OU - Both Eyes    Fluorescein Angiography Optos (Transit OD)    Fluorescein Sodium 10 % injection 500 mg  2. Obstructive sleep apnea of adult  G47.33   3. Type 2 macular telangiectasis, right  H35.071   4. Intermediate stage nonexudative  age-related macular degeneration of both eyes  H35.3132     1.  2.  3.  Ophthalmic Meds Ordered this visit:  Meds ordered this encounter  Medications  . Fluorescein Sodium 10 % injection 500 mg       Return in about 3 months  (around 01/07/2021) for DILATE OU, OCT.  There are no Patient Instructions on file for this visit.   Explained the diagnoses, plan, and follow up with the patient and they expressed understanding.  Patient expressed understanding of the importance of proper follow up care.   Clent Demark Mackenzy Grumbine M.D. Diseases & Surgery of the Retina and Vitreous [facility] 10/07/20     Abbreviations: M myopia (nearsighted); A astigmatism; H hyperopia (farsighted); P presbyopia; Mrx spectacle prescription;  CTL contact lenses; OD right eye; OS left eye; OU both eyes  XT exotropia; ET esotropia; PEK punctate epithelial keratitis; PEE punctate epithelial erosions; DES dry eye syndrome; MGD meibomian gland dysfunction; ATs artificial tears; PFAT's preservative free artificial tears; [place] nuclear sclerotic cataract; PSC posterior subcapsular cataract; ERM epi-retinal membrane; PVD posterior vitreous detachment; RD retinal detachment; DM diabetes mellitus; DR diabetic retinopathy; NPDR non-proliferative diabetic retinopathy; PDR proliferative diabetic retinopathy; CSME clinically significant macular edema; DME diabetic macular edema; dbh dot blot hemorrhages; CWS cotton wool spot; POAG primary open angle glaucoma; C/D cup-to-disc ratio; HVF humphrey visual field; GVF goldmann visual field; OCT optical coherence tomography; IOP intraocular pressure; BRVO Branch retinal vein occlusion; CRVO central retinal vein occlusion; CRAO central retinal artery occlusion; BRAO branch retinal artery occlusion; RT retinal tear; SB scleral buckle; PPV pars plana vitrectomy; VH Vitreous hemorrhage; PRP panretinal laser photocoagulation; IVK intravitreal kenalog; VMT vitreomacular traction; MH Macular hole;  NVD neovascularization of the disc; NVE neovascularization elsewhere; AREDS age related eye disease study; ARMD age related macular degeneration; POAG primary open angle glaucoma; EBMD epithelial/anterior basement membrane  dystrophy; ACIOL anterior chamber intraocular lens; IOL intraocular lens; PCIOL posterior chamber intraocular lens; Phaco/IOL phacoemulsification with intraocular lens placement; [place] photorefractive keratectomy; LASIK laser assisted in situ keratomileusis; HTN hypertension; DM diabetes mellitus; COPD chronic obstructive pulmonary disease

## 2020-10-07 NOTE — Assessment & Plan Note (Signed)
No signs of CNVM will observe

## 2020-10-07 NOTE — Assessment & Plan Note (Signed)
Patient continues to be compliant with CPAP use.

## 2020-10-08 ENCOUNTER — Encounter (INDEPENDENT_AMBULATORY_CARE_PROVIDER_SITE_OTHER): Payer: PPO | Admitting: Ophthalmology

## 2020-10-09 DIAGNOSIS — F4323 Adjustment disorder with mixed anxiety and depressed mood: Secondary | ICD-10-CM | POA: Diagnosis not present

## 2020-10-12 DIAGNOSIS — G4733 Obstructive sleep apnea (adult) (pediatric): Secondary | ICD-10-CM | POA: Diagnosis not present

## 2020-10-13 NOTE — Progress Notes (Signed)
Remote pacemaker transmission.   

## 2020-10-23 DIAGNOSIS — G4733 Obstructive sleep apnea (adult) (pediatric): Secondary | ICD-10-CM | POA: Diagnosis not present

## 2020-10-29 DIAGNOSIS — F4323 Adjustment disorder with mixed anxiety and depressed mood: Secondary | ICD-10-CM | POA: Diagnosis not present

## 2020-11-03 DIAGNOSIS — F3176 Bipolar disorder, in full remission, most recent episode depressed: Secondary | ICD-10-CM | POA: Diagnosis not present

## 2020-11-03 DIAGNOSIS — F3174 Bipolar disorder, in full remission, most recent episode manic: Secondary | ICD-10-CM | POA: Diagnosis not present

## 2020-11-03 DIAGNOSIS — F3175 Bipolar disorder, in partial remission, most recent episode depressed: Secondary | ICD-10-CM | POA: Diagnosis not present

## 2020-11-06 DIAGNOSIS — F4323 Adjustment disorder with mixed anxiety and depressed mood: Secondary | ICD-10-CM | POA: Diagnosis not present

## 2020-11-09 DIAGNOSIS — H3410 Central retinal artery occlusion, unspecified eye: Secondary | ICD-10-CM

## 2020-11-09 HISTORY — DX: Central retinal artery occlusion, unspecified eye: H34.10

## 2020-11-11 DIAGNOSIS — G4733 Obstructive sleep apnea (adult) (pediatric): Secondary | ICD-10-CM | POA: Diagnosis not present

## 2020-11-25 DIAGNOSIS — F4323 Adjustment disorder with mixed anxiety and depressed mood: Secondary | ICD-10-CM | POA: Diagnosis not present

## 2020-12-01 ENCOUNTER — Encounter (INDEPENDENT_AMBULATORY_CARE_PROVIDER_SITE_OTHER): Payer: Self-pay | Admitting: Ophthalmology

## 2020-12-01 ENCOUNTER — Other Ambulatory Visit: Payer: Self-pay

## 2020-12-01 ENCOUNTER — Telehealth: Payer: Self-pay | Admitting: Cardiology

## 2020-12-01 ENCOUNTER — Encounter: Payer: Self-pay | Admitting: Family Medicine

## 2020-12-01 ENCOUNTER — Ambulatory Visit (INDEPENDENT_AMBULATORY_CARE_PROVIDER_SITE_OTHER): Payer: PPO | Admitting: Ophthalmology

## 2020-12-01 DIAGNOSIS — H34232 Retinal artery branch occlusion, left eye: Secondary | ICD-10-CM

## 2020-12-01 DIAGNOSIS — H353132 Nonexudative age-related macular degeneration, bilateral, intermediate dry stage: Secondary | ICD-10-CM | POA: Diagnosis not present

## 2020-12-01 DIAGNOSIS — E781 Pure hyperglyceridemia: Secondary | ICD-10-CM

## 2020-12-01 DIAGNOSIS — I10 Essential (primary) hypertension: Secondary | ICD-10-CM

## 2020-12-01 DIAGNOSIS — H3581 Retinal edema: Secondary | ICD-10-CM | POA: Diagnosis not present

## 2020-12-01 DIAGNOSIS — R7303 Prediabetes: Secondary | ICD-10-CM

## 2020-12-01 NOTE — Telephone Encounter (Signed)
Please give pt a call - was seen by the retina specialists Dr. 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 Dr. Zadie Rhine told the pt to contact her cardiologist.   She would like to speak w/ a nurse today.   Please call [phone]

## 2020-12-01 NOTE — Telephone Encounter (Signed)
I reviewed the chart.  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  She is already on aspirin.  Typically would need to reassess degree of carotid atherosclerosis and also exclude atrial fibrillation.  I see that she was scheduled to have carotid Dopplers later in August, go ahead and get these done soon.  Would also place a 14-day Zio patch.  Ideally, she should also be on statin therapy, would start Crestor 10 mg daily.  She is a former patient Dr. Bronson Ing that I met in February, also following with Dr. Wolfgang Phoenix at this point.

## 2020-12-01 NOTE — Progress Notes (Signed)
12/01/2020     CHIEF COMPLAINT Patient presents for Retina Evaluation and Flashes/floaters   HISTORY OF PRESENT ILLNESS: [name] is a 70 y.o. female who presents to the clinic today for:   HPI    Flashes/floaters    Laterality: left eye   Onset: 3 weeks ago   Duration: Constant   Course: gradually worsening       Last edited by Hurman Horn, MD on 12/01/2020  3:03 PM. (History)      Referring physician: Kathyrn Drown, MD [address] [place],  [place] [address]  HISTORICAL INFORMATION:   Selected notes from the MEDICAL RECORD NUMBER    Lab Results  Component Value Date   HGBA1C 6.0 (H) 02/08/2020     CURRENT MEDICATIONS: No current outpatient medications on file. (Ophthalmic Drugs)   No current facility-administered medications for this visit. (Ophthalmic Drugs)   Current Outpatient Medications (Other)  Medication Sig  . acetaminophen (TYLENOL) 650 MG CR tablet Take 650-1,300 mg by mouth See admin instructions. 1300MG  IN THE MORNING AND 650MG  AT BEDTIME  . albuterol (VENTOLIN HFA) 108 (90 Base) MCG/ACT inhaler Inhale 1-2 puffs into the lungs every 6 (six) hours as needed for wheezing or shortness of breath.  . ALPRAZolam (XANAX XR) 1 MG 24 hr tablet Take 1 mg by mouth every morning.   [name] ALPRAZolam (XANAX) 1 MG tablet Take 0.5 mg by mouth daily as needed for anxiety.   . calcium carbonate (TUMS - DOSED IN MG ELEMENTAL CALCIUM) 500 MG chewable tablet Chew 1-3 tablets by mouth daily as needed for indigestion or heartburn.  [name] glucosamine-chondroitin 500-400 MG tablet Take 2 tablets by mouth every morning.   . lamoTRIgine (LAMICTAL) 200 MG tablet Take 400 mg by mouth at bedtime.  . Multiple Vitamin (MULTIVITAMIN) tablet Take 1 tablet by mouth daily.  . QUEtiapine (SEROQUEL) 200 MG tablet Take 600 mg by mouth at bedtime.  [name] telmisartan (MICARDIS) 20 MG tablet   . Vitamin D, Ergocalciferol, (DRISDOL) 50000 units CAPS capsule Take 50,000 Units by  mouth every Sunday.    No current facility-administered medications for this visit. (Other)      REVIEW OF SYSTEMS:    ALLERGIES Allergies  Allergen Reactions  . Imipramine Hives and Rash  . Tricor [Fenofibrate] Other (See Comments)    Pain, "aching"    PAST MEDICAL HISTORY Past Medical History:  Diagnosis Date  . Anxiety   . Bipolar 1 disorder ([facility])   . Breast cancer, left breast ([facility]) 07/22/2011  . Depression   . Essential hypertension   . Hyperlipidemia   . IBS (irritable bowel syndrome)   . Memory loss   . OA (osteoarthritis)   . Obesity   . Personal history of chemotherapy 2009  . Personal history of radiation therapy 2009  . Polycystic ovarian disease   . Prediabetes   . Sinus node dysfunction ([facility]) 12/2015   Biotronik pacemaker - Dr. Lovena Le  . Sleep apnea    Past Surgical History:  Procedure Laterality Date  . ABDOMINAL HYSTERECTOMY    . APPENDECTOMY    . BREAST BIOPSY Left 2011  . BREAST BIOPSY  2008  . BREAST LUMPECTOMY Left 2008  . CHOLECYSTECTOMY    . COLONOSCOPY N/A 09/13/2013   Procedure: COLONOSCOPY;  Surgeon: [name], MD;  [facility];  Service: Endoscopy;  Laterality: N/A;  730  . EP IMPLANTABLE DEVICE N/A 01/06/2016   Procedure: Pacemaker Implant;  Surgeon: Carleene Overlie  Peyton Najjar, MD;  Location: [facility];  Service: Cardiovascular;  Laterality: N/A;  . ESOPHAGOGASTRODUODENOSCOPY N/A 01/17/2014   Procedure: ESOPHAGOGASTRODUODENOSCOPY (EGD);  Surgeon: [name], MD;  [facility]: [facility];  Service: Endoscopy;  Laterality: N/A;  245  . RIGHT/LEFT HEART CATH AND CORONARY ANGIOGRAPHY N/A 01/01/2020   Procedure: RIGHT/LEFT HEART CATH AND CORONARY ANGIOGRAPHY;  Surgeon: [name], MD;  Location: [facility];  Service: Cardiovascular;  Laterality: N/A;  . TOTAL KNEE ARTHROPLASTY Bilateral right knee   2012, 2007    FAMILY HISTORY Family History  Problem Relation Age of Onset  . Bipolar disorder Mother    . Diabetes Mother   . Bipolar disorder Sister   . Diabetes Sister   . Alcohol abuse Father   . Hypertension Father   . Heart disease Other     SOCIAL HISTORY Social History   Tobacco Use  . Smoking status: Never Smoker  . Smokeless tobacco: Never Used  Vaping Use  . Vaping Use: Never used  Substance Use Topics  . Alcohol use: No    Alcohol/week: 0.0 standard drinks  . Drug use: No         OPHTHALMIC EXAM:  Base Eye Exam    Visual Acuity (ETDRS)      Right Left   Dist [place] 20/50 20/40   Dist ph [place] 20/30 NI       Tonometry (Tonopen, 3:06 PM)      Right Left   Pressure 12 12       Neuro/Psych    Oriented x3: Yes   Mood/Affect: Normal       Dilation    Both eyes: 1.0% Mydriacyl, 2.5% Phenylephrine @ 3:07 PM        Slit Lamp and Fundus Exam    External Exam      Right Left   External Normal Normal       Slit Lamp Exam      Right Left   Lids/Lashes Normal Normal   Conjunctiva/Sclera White and quiet White and quiet   Cornea Clear Clear   Anterior Chamber Deep and quiet Deep and quiet   Iris Round and reactive Round and reactive   Lens Centered posterior chamber intraocular lens Centered posterior chamber intraocular lens   Anterior Vitreous Normal Normal       Fundus Exam      Right Left   Posterior Vitreous Normal Normal   Disc Normal Normal   C/D Ratio 0.1 0.2   Macula 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, Soft drusen, pigment over drusen INF,  no macular thickening, no membrane, no hemorrhage 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, Soft drusen, no macular thickening, no membrane, no hemorrhage   Vessels Normal EmbolusPartial plaque occlusion cholesterol, superior to the optic nerve with PERI papillary cotton-wool spotsThis suggests branch retinal artery occlusion, incomplete.   Periphery Normal no holes or tears, 28 and 20 diopter lens Normal no holes or tears, 28 and 20 diopter lens          IMAGING AND PROCEDURES  Imaging  and Procedures for 12/01/20  Color Fundus Photography Optos - OU - Both Eyes       Right Eye Progression has been stable. Disc findings include normal observations. Macula : retinal pigment epithelium abnormalities, drusen. Vessels : normal observations. Periphery : normal observations.   Left Eye Progression has worsened. Disc findings include normal observations. Macula : drusen, retinal pigment epithelium abnormalities. Periphery : normal observations.  Notes Intermediate age-related macular degeneration by color fundus photography.  Normal optic nerves normal periphery normal media  OS with Cholesterol plaque intraluminal superior to the optic nerve at the second-order branching of the superior branch retinal artery with adjacent cotton-wool spot formation suggesting ischemia, no overall retinal whitening however                ASSESSMENT/PLAN:  BRAO (branch retinal artery occlusion), left New onset visual symptom left eye coincident with branch retinal artery occlusion cholesterol plaque yellowish intraluminal seen superior to the optic nerve and visualized with incomplete occlusion yet with PERI papillary cotton-wool spots coincident and coinciding with the visual field relative deficit  Patient instructed to commence with use of aspirin daily 325 mg and follow-up with medical doctor promptly for reevaluation for possible carotid stenosis and/or plaque formation as an embolic phenomenon.  Intermediate stage nonexudative age-related macular degeneration of both eyes No interval change in ARMD OU      ICD-10-CM   1. BRAO (branch retinal artery occlusion), left  H34.232 Color Fundus Photography Optos - OU - Both Eyes  2. Cotton wool spots  H35.81 Color Fundus Photography Optos - OU - Both Eyes  3. Intermediate stage nonexudative age-related macular degeneration of both eyes  H35.3132     1.  I recommend follow-up with Dr. [name] promptly for consideration of  carotid artery evaluation with Dopplers and/or other vascular work-up including echocardiogram of the heart and aortic root if possible looking for source of cholesterol emboli  2.  3.  Ophthalmic Meds Ordered this visit:  No orders of the defined types were placed in this encounter.      Return in about 2 weeks (around 12/15/2020) for DILATE OU, COLOR FP, OPTOS FFA L/R.  There are no Patient Instructions on file for this visit.   Explained the diagnoses, plan, and follow up with the patient and they expressed understanding.  Patient expressed understanding of the importance of proper follow up care.   Clent Demark Jennyfer Nickolson M.D. Diseases & Surgery of the Retina and Vitreous [facility] 12/01/20     Abbreviations: M myopia (nearsighted); A astigmatism; H hyperopia (farsighted); P presbyopia; Mrx spectacle prescription;  CTL contact lenses; OD right eye; OS left eye; OU both eyes  XT exotropia; ET esotropia; PEK punctate epithelial keratitis; PEE punctate epithelial erosions; DES dry eye syndrome; MGD meibomian gland dysfunction; ATs artificial tears; PFAT's preservative free artificial tears; [place] nuclear sclerotic cataract; PSC posterior subcapsular cataract; ERM epi-retinal membrane; PVD posterior vitreous detachment; RD retinal detachment; DM diabetes mellitus; DR diabetic retinopathy; NPDR non-proliferative diabetic retinopathy; PDR proliferative diabetic retinopathy; CSME clinically significant macular edema; DME diabetic macular edema; dbh dot blot hemorrhages; CWS cotton wool spot; POAG primary open angle glaucoma; C/D cup-to-disc ratio; HVF humphrey visual field; GVF goldmann visual field; OCT optical coherence tomography; IOP intraocular pressure; BRVO Branch retinal vein occlusion; CRVO central retinal vein occlusion; CRAO central retinal artery occlusion; BRAO branch retinal artery occlusion; RT retinal tear; SB scleral buckle; PPV pars plana vitrectomy; VH Vitreous  hemorrhage; PRP panretinal laser photocoagulation; IVK intravitreal kenalog; VMT vitreomacular traction; MH Macular hole;  NVD neovascularization of the disc; NVE neovascularization elsewhere; AREDS age related eye disease study; ARMD age related macular degeneration; POAG primary open angle glaucoma; EBMD epithelial/anterior basement membrane dystrophy; ACIOL anterior chamber intraocular lens; IOL intraocular lens; PCIOL posterior chamber intraocular lens; Phaco/IOL phacoemulsification with intraocular lens placement; PRK photorefractive keratectomy; LASIK laser assisted in situ keratomileusis; HTN hypertension;  DM diabetes mellitus; COPD chronic obstructive pulmonary disease

## 2020-12-01 NOTE — Telephone Encounter (Signed)
Pt states that she was told to contact her cardiologist as soon as possible.

## 2020-12-01 NOTE — Assessment & Plan Note (Signed)
No interval change in ARMD OU

## 2020-12-01 NOTE — Assessment & Plan Note (Addendum)
New onset visual symptom left eye coincident with branch retinal artery occlusion cholesterol plaque yellowish intraluminal seen superior to the optic nerve and visualized with incomplete occlusion yet with PERI papillary cotton-wool spots coincident and coinciding with the visual field relative deficit  Patient instructed to commence with use of aspirin daily 325 mg and follow-up with medical doctor promptly for reevaluation for possible carotid stenosis and/or plaque formation as an embolic phenomenon.

## 2020-12-02 ENCOUNTER — Other Ambulatory Visit: Payer: Self-pay | Admitting: Cardiology

## 2020-12-02 ENCOUNTER — Ambulatory Visit (INDEPENDENT_AMBULATORY_CARE_PROVIDER_SITE_OTHER): Payer: PPO

## 2020-12-02 ENCOUNTER — Telehealth: Payer: Self-pay | Admitting: Cardiology

## 2020-12-02 DIAGNOSIS — I4891 Unspecified atrial fibrillation: Secondary | ICD-10-CM

## 2020-12-02 MED ORDER — ROSUVASTATIN CALCIUM 10 MG PO TABS: 10.0000 mg | ORAL_TABLET | Freq: Every day | ORAL | 3 refills | Status: DC

## 2020-12-02 NOTE — Telephone Encounter (Signed)
Please give pt a call concerning the [facility]   [phone]

## 2020-12-02 NOTE — Telephone Encounter (Signed)
Nurses I received a note from ophthalmology regarding this patient She has been set up for an office visit on Thursday Also Dr. Domenic Polite is moving forward with several things including monitor, Crestor 10 mg, they will be moving up the carotid ultrasound that was scheduled for August  Continuing the 325 mg aspirin and keeping the appointment so we can recheck blood pressure and discuss all of this in detail  If possible I would prefer for the patient to do a metabolic 7, lipid, liver tomorrow morning fasting so that we would have results back by Thursday  No other interventions necessary at this moment- we can do detailed discussion on Thursday  Please forward a copy of this message to Braley and also put in the orders for the lab work hopefully she can do this on Wednesday  Thanks-Dr. Nicki Reaper

## 2020-12-02 NOTE — Telephone Encounter (Signed)
Pt states that she placed monitor with the help of Zio. She was unsure if monitor was on. Ensured pt that if monitors light came on and went off that monitor is on.

## 2020-12-02 NOTE — Addendum Note (Signed)
Addended by: Dairl Ponder on: 12/02/2020 10:28 AM   Modules accepted: Orders

## 2020-12-02 NOTE — Telephone Encounter (Signed)
Pt notified of Dr. Myles Gip note. Crestor called in and msg to be sent to scheduler. Pt states that she would prefer to pick up monitor in office rather than having it mailed to her.

## 2020-12-03 ENCOUNTER — Telehealth: Payer: Self-pay

## 2020-12-03 DIAGNOSIS — I10 Essential (primary) hypertension: Secondary | ICD-10-CM | POA: Diagnosis not present

## 2020-12-03 DIAGNOSIS — E781 Pure hyperglyceridemia: Secondary | ICD-10-CM | POA: Diagnosis not present

## 2020-12-03 DIAGNOSIS — R7303 Prediabetes: Secondary | ICD-10-CM | POA: Diagnosis not present

## 2020-12-03 NOTE — Telephone Encounter (Signed)
Spoke to pt and verbalized that pt's appointment is first available for carotid doppler. Pt voiced understanding and is willing to wait until December 11, 2020 for appointment.

## 2020-12-03 NOTE — Telephone Encounter (Signed)
NEW MESSAGE    PATIENT WANTS ORDER FOR CAROTID CHANGED TO STAT, SHE WANTS TO HAVE IT DONE TODAY.

## 2020-12-03 NOTE — Telephone Encounter (Signed)
-----   Message from Levonne Hubert, LPN sent at 0/07/6427  8:19 AM EDT ----- Regarding: Carotid Doppler Pt has carotid dopplers scheduled for Aug. Please reschedule for now.   Thank you,  Alda Berthold

## 2020-12-03 NOTE — Telephone Encounter (Signed)
LMTCB TO RESCHEDULE CAROTID DOPPLER

## 2020-12-04 ENCOUNTER — Encounter: Payer: Self-pay | Admitting: Family Medicine

## 2020-12-04 ENCOUNTER — Other Ambulatory Visit: Payer: Self-pay

## 2020-12-04 ENCOUNTER — Ambulatory Visit (INDEPENDENT_AMBULATORY_CARE_PROVIDER_SITE_OTHER): Payer: PPO | Admitting: Family Medicine

## 2020-12-04 VITALS — BP 127/74 | HR 65 | Temp 97.2°F | Wt 275.6 lb

## 2020-12-04 DIAGNOSIS — R7303 Prediabetes: Secondary | ICD-10-CM

## 2020-12-04 DIAGNOSIS — H349 Unspecified retinal vascular occlusion: Secondary | ICD-10-CM

## 2020-12-04 DIAGNOSIS — E785 Hyperlipidemia, unspecified: Secondary | ICD-10-CM | POA: Diagnosis not present

## 2020-12-04 DIAGNOSIS — I10 Essential (primary) hypertension: Secondary | ICD-10-CM | POA: Diagnosis not present

## 2020-12-04 LAB — HEPATIC FUNCTION PANEL
ALT: 16 IU/L (ref 0–32)
AST: 22 IU/L (ref 0–40)
Albumin: 4.7 g/dL (ref 3.8–4.8)
Alkaline Phosphatase: 111 IU/L (ref 44–121)
Bilirubin Total: 0.8 mg/dL (ref 0.0–1.2)
Bilirubin, Direct: 0.23 mg/dL (ref 0.00–0.40)
Total Protein: 6.9 g/dL (ref 6.0–8.5)

## 2020-12-04 LAB — BASIC METABOLIC PANEL
BUN/Creatinine Ratio: 18 (ref 12–28)
BUN: 16 mg/dL (ref 8–27)
CO2: 24 mmol/L (ref 20–29)
Calcium: 9.8 mg/dL (ref 8.7–10.3)
Chloride: 100 mmol/L (ref 96–106)
Creatinine, Ser: 0.9 mg/dL (ref 0.57–1.00)
Glucose: 112 mg/dL — ABNORMAL HIGH (ref 65–99)
Potassium: 4.4 mmol/L (ref 3.5–5.2)
Sodium: 141 mmol/L (ref 134–144)
eGFR: 70 mL/min/{1.73_m2} (ref >59)

## 2020-12-04 LAB — LIPID PANEL
Chol/HDL Ratio: 3.5 ratio (ref 0.0–4.4)
Cholesterol, Total: 139 mg/dL (ref 100–199)
HDL: 40 mg/dL (ref >39)
LDL Chol Calc (NIH): 67 mg/dL (ref 0–99)
Triglycerides: 194 mg/dL — ABNORMAL HIGH (ref 0–149)
VLDL Cholesterol Cal: 32 mg/dL (ref 5–40)

## 2020-12-04 NOTE — Progress Notes (Signed)
   Subjective:    Patient ID: [name], female    DOB: [birth year], 70 y.o.   MRN: [id number] Very nice patient here today for follow-up HPI Pt here for possible stroke in eye. Saw Dr.Rankin Monday. Seeing Dr.Rankin for macular degeneration. Having flashes in left eye and blurriness in outer aspect for past 3 weeks.  Patient denies any unilateral numbness weakness States she is going back to see GI specialist within the next 2 weeks Review of Systems     Objective:   Physical Exam Lungs are clear heart regular pulse normal BP good extremities no edema  Results for orders placed or performed in visit on 12/01/20  Lipid panel  Result Value Ref Range   Cholesterol, Total 139 100 - 199 mg/dL   Triglycerides 194 (H) 0 - 149 mg/dL   HDL 40 >39 mg/dL   VLDL Cholesterol Cal 32 5 - 40 mg/dL   LDL Chol Calc (NIH) 67 0 - 99 mg/dL   Chol/HDL Ratio 3.5 0.0 - 4.4 ratio  Hepatic function panel  Result Value Ref Range   Total Protein 6.9 6.0 - 8.5 g/dL   Albumin 4.7 3.8 - 4.8 g/dL   Bilirubin Total 0.8 0.0 - 1.2 mg/dL   Bilirubin, Direct 0.23 0.00 - 0.40 mg/dL   Alkaline Phosphatase 111 44 - 121 IU/L   AST 22 0 - 40 IU/L   ALT 16 0 - 32 IU/L  Basic metabolic panel  Result Value Ref Range   Glucose 112 (H) 65 - 99 mg/dL   BUN 16 8 - 27 mg/dL   Creatinine, Ser 0.90 0.57 - 1.00 mg/dL   eGFR 70 >59 mL/min/1.73   BUN/Creatinine Ratio 18 12 - 28   Sodium 141 134 - 144 mmol/L   Potassium 4.4 3.5 - 5.2 mmol/L   Chloride 100 96 - 106 mmol/L   CO2 24 20 - 29 mmol/L   Calcium 9.8 8.7 - 10.3 mg/dL   Discussion with the patient how to minimize her risk of strokes including keeping A1c under good control sugars under good control healthy diet healthy blood pressure and keeping LDL below 70.  Continuing statins as well.     Assessment & Plan:  1. Prediabetes We will check A1c before next visit continue to watch starches glucose slightly up Importance of minimizing starches regular  activity discussed.  2. Essential hypertension Blood pressure very good watch diet closely recheck her again in 1 month encourage her to continue to exercise at the [facility] plus also be mindful of eating habits we will go ahead with referral for nutritionist as well  3. Retinal artery occlusion Was seen by the eye specialist felt to have a retinal artery occlusion due to a cholesterol plaque carotid ultrasound next week she is on 325 mg aspirin daily and also was started on Crestor we will recheck a lipid liver profile in 8 weeks  4. Hyperlipidemia, unspecified hyperlipidemia type Hyperlipidemia see discussion above nutritionist consult as well We will have the patient do follow-up lipid liver profile in 8 weeks to make sure she is getting along with the Crestor  Patient prefers to follow-up with cardiology at [facility] office because of severe difficulty leaving town

## 2020-12-05 NOTE — Addendum Note (Signed)
Addended by: Dairl Ponder on: 12/05/2020 11:08 AM   Modules accepted: Orders

## 2020-12-05 NOTE — Progress Notes (Signed)
Cardiology referral ordered in [facility]

## 2020-12-11 ENCOUNTER — Other Ambulatory Visit: Payer: Self-pay

## 2020-12-11 ENCOUNTER — Ambulatory Visit (HOSPITAL_COMMUNITY)
Admission: RE | Admit: 2020-12-11 | Discharge: 2020-12-11 | Disposition: A | Discharge status: Home / Self Care | Payer: PPO | Source: Ambulatory Visit | Attending: Cardiology | Admitting: Cardiology

## 2020-12-11 ENCOUNTER — Encounter: Payer: Self-pay | Admitting: Family Medicine

## 2020-12-11 DIAGNOSIS — I6521 Occlusion and stenosis of right carotid artery: Secondary | ICD-10-CM | POA: Diagnosis not present

## 2020-12-11 DIAGNOSIS — I10 Essential (primary) hypertension: Secondary | ICD-10-CM | POA: Diagnosis not present

## 2020-12-11 DIAGNOSIS — I6523 Occlusion and stenosis of bilateral carotid arteries: Secondary | ICD-10-CM | POA: Diagnosis not present

## 2020-12-11 DIAGNOSIS — E785 Hyperlipidemia, unspecified: Secondary | ICD-10-CM | POA: Diagnosis not present

## 2020-12-11 DIAGNOSIS — H539 Unspecified visual disturbance: Secondary | ICD-10-CM | POA: Diagnosis not present

## 2020-12-12 ENCOUNTER — Telehealth: Payer: Self-pay

## 2020-12-12 DIAGNOSIS — G4733 Obstructive sleep apnea (adult) (pediatric): Secondary | ICD-10-CM | POA: Diagnosis not present

## 2020-12-12 NOTE — Telephone Encounter (Signed)
I spoke with patient and she states she is already taking Crestor 10 mg daily by Dr.luking.

## 2020-12-12 NOTE — Telephone Encounter (Signed)
Left message to return call 

## 2020-12-12 NOTE — Telephone Encounter (Signed)
-----  Message from [name], MD sent at 12/11/2020 11:23 AM EDT ----- Results reviewed.  Follow-up carotid Dopplers indicate less than 50% bilateral ICA stenosis.  Overall reassuring.  We met recently in the office.  Would ask whether she would be opposed to starting low-dose statin therapy for further plaque stabilization.  Her last LDL was 97.  Could start low dose with Crestor 5 mg daily.

## 2020-12-15 ENCOUNTER — Other Ambulatory Visit: Payer: Self-pay

## 2020-12-15 ENCOUNTER — Ambulatory Visit (INDEPENDENT_AMBULATORY_CARE_PROVIDER_SITE_OTHER): Payer: PPO | Admitting: Ophthalmology

## 2020-12-15 ENCOUNTER — Encounter (INDEPENDENT_AMBULATORY_CARE_PROVIDER_SITE_OTHER): Payer: Self-pay | Admitting: Ophthalmology

## 2020-12-15 ENCOUNTER — Telehealth: Payer: Self-pay | Admitting: Adult Health

## 2020-12-15 DIAGNOSIS — H34232 Retinal artery branch occlusion, left eye: Secondary | ICD-10-CM | POA: Diagnosis not present

## 2020-12-15 DIAGNOSIS — H3321 Serous retinal detachment, right eye: Secondary | ICD-10-CM

## 2020-12-15 DIAGNOSIS — G4733 Obstructive sleep apnea (adult) (pediatric): Secondary | ICD-10-CM | POA: Diagnosis not present

## 2020-12-15 DIAGNOSIS — H35071 Retinal telangiectasis, right eye: Secondary | ICD-10-CM

## 2020-12-15 DIAGNOSIS — H353132 Nonexudative age-related macular degeneration, bilateral, intermediate dry stage: Secondary | ICD-10-CM

## 2020-12-15 MED ORDER — FLUORESCEIN SODIUM 10 % IV SOLN
500.0000 mg | INTRAVENOUS | Status: AC | PRN
  Administered 2020-12-15: 500 mg via INTRAVENOUS

## 2020-12-15 NOTE — Assessment & Plan Note (Addendum)
As of this date, patient using her CPAP consistently for now 3 months  I reviewed the findings of the macular OCT of the right left eye as compared to onset of her visits in this office February 2022.  I am disclosing Temporal aspect of the macula right eye demonstrated the resolution of the tiny black dots on the OCT, as demonstrable CME temporal and nasal aspect of the fovea February 2022 as compared to now with complete resolution  Demonstrable evidence of the benefit of preventing nightly hypoxic maculopathy with use of CPAP  this case involving obstructive sleep apnea

## 2020-12-15 NOTE — Telephone Encounter (Signed)
6/15 OV cancelled due to np being out of office.

## 2020-12-15 NOTE — Assessment & Plan Note (Signed)
Stable OD °

## 2020-12-15 NOTE — Progress Notes (Signed)
12/15/2020     CHIEF COMPLAINT Patient presents for Retina Follow Up (2 Wk F/U, FP/FFA L/R//Pt sts OS temporally seems "opaque." Pt denies ocular pain. OD stable. Pt reports occasional flash of light OD. )   HISTORY OF PRESENT ILLNESS: [name] is a 70 y.o. female who presents to the clinic today for:   HPI    Retina Follow Up    Diagnosis: Other   Laterality: left eye   Onset: 2 weeks ago   Severity: moderate   Duration: 2 weeks   Course: stable   Comments: 2 Wk F/U, FP/FFA L/R  Pt sts OS temporally seems "opaque." Pt denies ocular pain. OD stable. Pt reports occasional flash of light OD.        Last edited by Rockie Neighbours, [place] on 12/15/2020  3:52 PM. (History)      Referring physician: Kathyrn Drown, MD [address] [place],  [place] [address]  HISTORICAL INFORMATION:   Selected notes from the MEDICAL RECORD NUMBER    Lab Results  Component Value Date   HGBA1C 6.0 (H) 02/08/2020     CURRENT MEDICATIONS: No current outpatient medications on file. (Ophthalmic Drugs)   No current facility-administered medications for this visit. (Ophthalmic Drugs)   Current Outpatient Medications (Other)  Medication Sig  . acetaminophen (TYLENOL) 650 MG CR tablet Take 650-1,300 mg by mouth See admin instructions. 1300MG  IN THE MORNING AND 650MG  AT BEDTIME  . albuterol (VENTOLIN HFA) 108 (90 Base) MCG/ACT inhaler Inhale 1-2 puffs into the lungs every 6 (six) hours as needed for wheezing or shortness of breath.  . ALPRAZolam (XANAX XR) 1 MG 24 hr tablet Take 1 mg by mouth every morning.   [name] ALPRAZolam (XANAX) 1 MG tablet Take 0.5 mg by mouth daily as needed for anxiety.   . calcium carbonate (TUMS - DOSED IN MG ELEMENTAL CALCIUM) 500 MG chewable tablet Chew 1-3 tablets by mouth daily as needed for indigestion or heartburn.  [name] glucosamine-chondroitin 500-400 MG tablet Take 2 tablets by mouth every morning.   . lamoTRIgine (LAMICTAL) 200 MG tablet Take 400 mg  by mouth at bedtime.  . Multiple Vitamin (MULTIVITAMIN) tablet Take 1 tablet by mouth daily.  . QUEtiapine (SEROQUEL) 200 MG tablet Take 600 mg by mouth at bedtime.  . rosuvastatin (CRESTOR) 10 MG tablet Take 1 tablet (10 mg total) by mouth daily.  [name] telmisartan (MICARDIS) 20 MG tablet   . Vitamin D, Ergocalciferol, (DRISDOL) 50000 units CAPS capsule Take 50,000 Units by mouth every Sunday.    No current facility-administered medications for this visit. (Other)      REVIEW OF SYSTEMS:    ALLERGIES Allergies  Allergen Reactions  . Imipramine Hives and Rash  . Tricor [Fenofibrate] Other (See Comments)    Pain, "aching"    PAST MEDICAL HISTORY Past Medical History:  Diagnosis Date  . Anxiety   . Bipolar 1 disorder ([facility])   . Breast cancer, left breast ([facility]) 07/22/2011  . Depression   . Essential hypertension   . Hyperlipidemia   . IBS (irritable bowel syndrome)   . Memory loss   . OA (osteoarthritis)   . Obesity   . Personal history of chemotherapy 2009  . Personal history of radiation therapy 2009  . Polycystic ovarian disease   . Prediabetes   . Sinus node dysfunction ([facility]) 12/2015   Biotronik pacemaker - Dr. Lovena Le  . Sleep apnea    Past Surgical History:  Procedure Laterality Date  .  ABDOMINAL HYSTERECTOMY    . APPENDECTOMY    . BREAST BIOPSY Left 2011  . BREAST BIOPSY  2008  . BREAST LUMPECTOMY Left 2008  . CHOLECYSTECTOMY    . COLONOSCOPY N/A 09/13/2013   Procedure: COLONOSCOPY;  Surgeon: [name], MD;  [facility];  Service: Endoscopy;  Laterality: N/A;  730  . EP IMPLANTABLE DEVICE N/A 01/06/2016   Procedure: Pacemaker Implant;  Surgeon: [name], MD;  Location: [facility];  Service: Cardiovascular;  Laterality: N/A;  . ESOPHAGOGASTRODUODENOSCOPY N/A 01/17/2014   Procedure: ESOPHAGOGASTRODUODENOSCOPY (EGD);  Surgeon: [name], MD;  [facility]: [facility];  Service: Endoscopy;  Laterality: N/A;  245  . RIGHT/LEFT HEART  CATH AND CORONARY ANGIOGRAPHY N/A 01/01/2020   Procedure: RIGHT/LEFT HEART CATH AND CORONARY ANGIOGRAPHY;  Surgeon: [name], MD;  Location: [facility];  Service: Cardiovascular;  Laterality: N/A;  . TOTAL KNEE ARTHROPLASTY Bilateral right knee   2012, 2007    FAMILY HISTORY Family History  Problem Relation Age of Onset  . Bipolar disorder Mother   . Diabetes Mother   . Bipolar disorder Sister   . Diabetes Sister   . Alcohol abuse Father   . Hypertension Father   . Heart disease Other     SOCIAL HISTORY Social History   Tobacco Use  . Smoking status: Never Smoker  . Smokeless tobacco: Never Used  Vaping Use  . Vaping Use: Never used  Substance Use Topics  . Alcohol use: No    Alcohol/week: 0.0 standard drinks  . Drug use: No         OPHTHALMIC EXAM:  Base Eye Exam    Visual Acuity (ETDRS)      Right Left   Dist [place] 20/50 -1 20/30 -1   Dist ph [place] 20/40 NI       Tonometry (Tonopen, 3:57 PM)      Right Left   Pressure 12 11       Pupils      Pupils Dark Light Shape React APD   Right PERRL 7 6 Round Brisk None   Left PERRL 7 6 Round Brisk None       Visual Fields (Counting fingers)      Left Right    Full Full       Extraocular Movement      Right Left    Full Full       Neuro/Psych    Oriented x3: Yes   Mood/Affect: Normal       Dilation    Both eyes: 1.0% Mydriacyl, 2.5% Phenylephrine @ 3:57 PM        Slit Lamp and Fundus Exam    External Exam      Right Left   External Normal Normal       Slit Lamp Exam      Right Left   Lids/Lashes Normal Normal   Conjunctiva/Sclera White and quiet White and quiet   Cornea Clear Clear   Anterior Chamber Deep and quiet Deep and quiet   Iris Round and reactive Round and reactive   Lens Centered posterior chamber intraocular lens Centered posterior chamber intraocular lens, 1+ Posterior capsular opacification   Anterior Vitreous Normal Normal       Fundus Exam      Right  Left   Posterior Vitreous Normal Normal   Disc Normal Normal   C/D Ratio 0.1 0.2   Macula 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, Soft  drusen, pigment over drusen INF,  no macular thickening, no membrane, no hemorrhage 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, Soft drusen, no macular thickening, no membrane, no hemorrhage   Vessels Normal EmbolusPartial plaque occlusion cholesterol, superior to the optic nerve, This suggests branch retinal artery occlusion, incomplete, peripapillary cotton wool spots have subsided   Periphery Normal no holes or tears, 28 and 20 diopter lens Normal no holes or tears, 28 and 20 diopter lens          IMAGING AND PROCEDURES  Imaging and Procedures for 12/15/20  OCT, Retina - OU - Both Eyes       Right Eye Quality was good. Scan locations included subfoveal. Central Foveal Thickness: 349. Findings include abnormal foveal contour, retinal drusen , subretinal fluid.   Left Eye Quality was good. Scan locations included subfoveal. Central Foveal Thickness: 348. Progression has been stable. Findings include retinal drusen , no IRF, no SRF.   Notes Slightly less intraretinal fluid inferior to the foveal zone as compared to OCT's dated 08/12/2020 and 08/26/2020, coincident with an improved overall central field thickness  Now on CPAP consistent use for the last 3 months       Color Fundus Photography Optos - OU - Both Eyes       Right Eye Progression has been stable. Disc findings include normal observations. Macula : retinal pigment epithelium abnormalities, drusen. Vessels : normal observations. Periphery : normal observations.   Left Eye Progression has been stable. Disc findings include normal observations. Macula : drusen, retinal pigment epithelium abnormalities. Periphery : normal observations.   Notes Intermediate age-related macular degeneration by color fundus photography.  Normal optic nerves normal periphery normal media  OS with  Cholesterol plaque intraluminal superior to the optic nerve at the second-order branching of the superior branch retinal artery with adjacent cotton-wool spot formation suggesting ischemia, no overall retinal whitening however       Fluorescein Angiography Optos (Transit OS)       Injection:  500 mg Fluorescein Sodium 10 % injection   NDC: [phone]   Route: IntravenousRight Eye Mid/Late phase findings include window defect, microaneurysm.   Left Eye   Progression has improved. Early phase findings include window defect, microaneurysm. Mid/Late phase findings include delayed filling, window defect.   Notes No late leakage seen in the parafoveal capillary network OD as noted February 2022, now that patient is compliant and has a well fitted mask for CPAP use  OD also this correlates with resolution of CNVM perifoveal CME and smaller serous subfoveal detachment as noted by OCT comparisons February versus today  OS no completion of small branch retinal artery occlusion left eye superiorly.  No areas of capillary nonperfusion.  Observe                ASSESSMENT/PLAN:  Obstructive sleep apnea of adult As of this date, patient using her CPAP consistently for now 3 months  I reviewed the findings of the macular OCT of the right left eye as compared to onset of her visits in this office February 2022.  I am disclosing Temporal aspect of the macula right eye demonstrated the resolution of the tiny black dots on the OCT, as demonstrable CME temporal and nasal aspect of the fovea February 2022 as compared to now with complete resolution  Demonstrable evidence of the benefit of preventing nightly hypoxic maculopathy with use of CPAP  this case involving obstructive sleep apnea  BRAO (branch retinal artery occlusion), left Partial occlusion, incomplete, no progression  to complete occlusion noted as there is no ischemic infarction noted.  Nonetheless small cholesterol plaque remain  superior to the optic nerve.  Patient continues on aspirin.  Worse angiography discloses that Retinal perfusion has been maintained, though thinned and attenuated superiorly left eye  Intermediate stage nonexudative age-related macular degeneration of both eyes No signs of CNVM  Serous retinal detachment of right eye Stable OD  Type 2 macular telangiectasis, right OD central foveal thickness has diminished from 406 mm with small CME temporal aspect of the macula  also improved and has resolved as of today with a central thickness of 349 mm,  The subfoveal serous elevation remains although With subfoveal hyper reflective material diminishing From initial height of 202 t microns now to 180 m, as compared to February 2022       ICD-10-CM   1. BRAO (branch retinal artery occlusion), left  H34.232 OCT, Retina - OU - Both Eyes    Color Fundus Photography Optos - OU - Both Eyes    Fluorescein Angiography Optos (Transit OS)    Fluorescein Sodium 10 % injection 500 mg  2. Obstructive sleep apnea of adult  G47.33   3. Intermediate stage nonexudative age-related macular degeneration of both eyes  H35.3132   4. Serous retinal detachment of right eye  H33.21   5. Type 2 macular telangiectasis, right  H35.071     1.  BRAO, with no completion of occlusion.  Per do recommend patient continue on aspirin 1 tablet daily  2.  I do also recommend patient continue on CPAP as the macular telangiectasis, perifoveal CME has improved significantly in the right eye demonstrable on fluorescein angiography today as well as on OCT as compared to February 2022  3.  Ophthalmic Meds Ordered this visit:  Meds ordered this encounter  Medications  . Fluorescein Sodium 10 % injection 500 mg       Return in about 4 months (around 04/16/2021) for DILATE OU, OCT.  There are no Patient Instructions on file for this visit.   Explained the diagnoses, plan, and follow up with the patient and they expressed  understanding.  Patient expressed understanding of the importance of proper follow up care.   Clent Demark [facility] M.D. Diseases & Surgery of the Retina and Vitreous Retina & Diabetic [place] 12/15/20     Abbreviations: M myopia (nearsighted); A astigmatism; H hyperopia (farsighted); P presbyopia; Mrx spectacle prescription;  CTL contact lenses; OD right eye; OS left eye; OU both eyes  XT exotropia; ET esotropia; PEK punctate epithelial keratitis; PEE punctate epithelial erosions; DES dry eye syndrome; MGD meibomian gland dysfunction; ATs artificial tears; PFAT's preservative free artificial tears; [place] nuclear sclerotic cataract; PSC posterior subcapsular cataract; ERM epi-retinal membrane; PVD posterior vitreous detachment; RD retinal detachment; DM diabetes mellitus; DR diabetic retinopathy; NPDR non-proliferative diabetic retinopathy; PDR proliferative diabetic retinopathy; CSME clinically significant macular edema; DME diabetic macular edema; dbh dot blot hemorrhages; CWS cotton wool spot; POAG primary open angle glaucoma; C/D cup-to-disc ratio; HVF humphrey visual field; GVF goldmann visual field; OCT optical coherence tomography; IOP intraocular pressure; BRVO Branch retinal vein occlusion; CRVO central retinal vein occlusion; CRAO central retinal artery occlusion; BRAO branch retinal artery occlusion; RT retinal tear; SB scleral buckle; PPV pars plana vitrectomy; VH Vitreous hemorrhage; PRP panretinal laser photocoagulation; IVK intravitreal kenalog; VMT vitreomacular traction; MH Macular hole;  NVD neovascularization of the disc; NVE neovascularization elsewhere; AREDS age related eye disease study; ARMD age related macular degeneration; POAG primary open angle  glaucoma; EBMD epithelial/anterior basement membrane dystrophy; ACIOL anterior chamber intraocular lens; IOL intraocular lens; PCIOL posterior chamber intraocular lens; Phaco/IOL phacoemulsification with intraocular lens placement; [place]  photorefractive keratectomy; LASIK laser assisted in situ keratomileusis; HTN hypertension; DM diabetes mellitus; COPD chronic obstructive pulmonary disease

## 2020-12-15 NOTE — Assessment & Plan Note (Signed)
OD central foveal thickness has diminished from 406 mm with small CME temporal aspect of the macula  also improved and has resolved as of today with a central thickness of 349 mm,  The subfoveal serous elevation remains although With subfoveal hyper reflective material diminishing From initial height of 202 t microns now to 180 m, as compared to February 2022

## 2020-12-15 NOTE — Assessment & Plan Note (Signed)
No signs of CNVM

## 2020-12-15 NOTE — Assessment & Plan Note (Signed)
Partial occlusion, incomplete, no progression to complete occlusion noted as there is no ischemic infarction noted.  Nonetheless small cholesterol plaque remain superior to the optic nerve.  Patient continues on aspirin.  Worse angiography discloses that Retinal perfusion has been maintained, though thinned and attenuated superiorly left eye

## 2020-12-23 DIAGNOSIS — I4891 Unspecified atrial fibrillation: Secondary | ICD-10-CM | POA: Diagnosis not present

## 2020-12-24 ENCOUNTER — Ambulatory Visit: Payer: PPO | Admitting: Adult Health

## 2020-12-31 LAB — CUP PACEART REMOTE DEVICE CHECK
Battery Remaining Percentage: 70 %
Brady Statistic RA Percent Paced: 98 %
Brady Statistic RV Percent Paced: 3 %
Date Time Interrogation Session: 20220622083833
Implantable Lead Implant Date: 20170627
Implantable Lead Implant Date: 20170627
Implantable Lead Location: 753859
Implantable Lead Location: 753860
Implantable Lead Model: 377
Implantable Lead Model: 377
Implantable Lead Serial Number: 49436227
Implantable Lead Serial Number: 49471106
Implantable Pulse Generator Implant Date: 20170627
Lead Channel Impedance Value: 488 Ohm
Lead Channel Impedance Value: 507 Ohm
Lead Channel Pacing Threshold Amplitude: 1.2 V
Lead Channel Pacing Threshold Amplitude: 1.8 V
Lead Channel Pacing Threshold Pulse Width: 0.4 ms
Lead Channel Pacing Threshold Pulse Width: 0.4 ms
Lead Channel Sensing Intrinsic Amplitude: 1.4 mV
Lead Channel Sensing Intrinsic Amplitude: 808 mV
Lead Channel Setting Pacing Amplitude: 2 V
Lead Channel Setting Pacing Amplitude: 2.4 V
Lead Channel Setting Pacing Pulse Width: 0.4 ms
Pulse Gen Model: 394969
Pulse Gen Serial Number: 68786455

## 2021-01-01 ENCOUNTER — Ambulatory Visit (INDEPENDENT_AMBULATORY_CARE_PROVIDER_SITE_OTHER): Payer: PPO

## 2021-01-01 DIAGNOSIS — I495 Sick sinus syndrome: Secondary | ICD-10-CM

## 2021-01-06 ENCOUNTER — Ambulatory Visit: Payer: PPO | Admitting: Family Medicine

## 2021-01-07 ENCOUNTER — Encounter: Payer: Self-pay | Admitting: Adult Health

## 2021-01-07 ENCOUNTER — Ambulatory Visit: Payer: PPO | Admitting: Adult Health

## 2021-01-07 VITALS — BP 130/73 | HR 66 | Ht 64.0 in | Wt 279.0 lb

## 2021-01-07 DIAGNOSIS — Z9989 Dependence on other enabling machines and devices: Secondary | ICD-10-CM

## 2021-01-07 DIAGNOSIS — G4733 Obstructive sleep apnea (adult) (pediatric): Secondary | ICD-10-CM | POA: Diagnosis not present

## 2021-01-07 NOTE — Patient Instructions (Signed)
Continue using CPAP nightly and greater than 4 hours each night °If your symptoms worsen or you develop new symptoms please let us know.  ° °

## 2021-01-07 NOTE — Progress Notes (Signed)
PATIENT: [name] DOB: [birth year]  REASON FOR VISIT: follow up HISTORY FROM: patient Primary neurologist: Dr. Brett Fairy   HISTORY OF PRESENT ILLNESS: Today 01/07/21:  [name] is a 70 year old female with a history of obstructive sleep apnea on CPAP.  She returns today for follow-up.  She reports that the CPAP works well for her.  She does state that she has had some trouble getting the correct Mac[employer]is from her DME company.  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  She has been followed by Dr. Zadie Rhine.    06/19/20:  [name] is a 70 year old female with a history of obstructive sleep apnea on CPAP.  She returns today for follow-up.  Her download indicates that she use her machine nightly for compliance of 100%.  She used her machine greater than 4 hours each night.  On average she uses her machine 6 and 1 minute.  Her residual AHI is 1.2 on 6 to 12 cm of water with EPR 2.  Leak in the 95th percentile is 35.9 L/min.  She states that she does feel the mask leaking at night.  She states that she feels that she is not getting as much rest because the mask leaks all night.  She returns today for an evaluation.  HISTORY [name] is a 70 y.o. year old White or Caucasian female patient and was seen here upon a referral on 01/07/2020 from Dr Wolfgang Phoenix, MD and cardiologist Dr. Crissie Sickles and Dr. Domenic Polite, MD  Chief concern according to patient :   She has been very fatigued short of breath and feels exhausted.  She has bipolar disorder and felt memory was affected has been seen by Dr Krista Blue within the last 3 years     [name] reports that her primary care physician had urged her probably a year ago to go through a sleep evaluation but about doing the last 6 weeks she had cardiovascular evaluations was diagnosed with CHF and have been repeatedly  admitted and observed in the hospital for short-term.  Her cardiologist now urges the sleep study as  well.  She has been very fatigued short of breath and feels exhausted.  She has never been a smoker or tobacco user in any form she has a past history of depression anxiety, she had a pacemaker placed 3 years ago, she is a breast cancer survivor left breast, she has a history of hypertension, bilateral knee replacements colonoscopies she underwent a lumpectomy of the left breast and over 10 years ago had a cholecystectomy and hysterectomy.  The diagnosis of CHF was stated on 12-05-19.  She reports that she was fluid overloaded was admitted to [facility] and had an echocardiogram she followed up with the cardiology nurse practitioner [name] on 2 June had a normal stress test on 4 June and on 21 June just last week was evaluated with a normal cardiac cath.  She was hospitalized until 6-23.  Her cardiologist states that she had recent dyspnea due to diastolic heart failure, intermittent wheezing was noted and her chest x-ray had revealed interstitial edema.  Cardiac enzymes were negative the EKG did not show acute changes.  She was diuresed and her echocardiogram showed a normal ejection fraction with a grade 1 diastolic dysfunction.  Dr. Domenic Polite agreed with outpatient follow-up discharged on low-dose Lasix.  She had seen Crissie Sickles in October 2020 for sinus node dysfunction s/p Biotronik PPM insertion in the  year 2018.     Sleep relevant medical history: Nocturia: may be once ,  Tonsillectomy in childhood, no TBI, no ENT procedures but wisdom teeth removed. .   Family medical [name] history: No other family member on CPAP with OSA, insomnia, and no sleep walkers.    Social history:  Patient is retired from Nursing profession and left at age 64 , she  lives in a household with her niece- her husband lives in psychiatric facility-  Family status is married , with 3 adult children, no grandchildren.  Pets are present, a cat.  Tobacco use- never .  ETOH use; never ,  Caffeine intake in form of Coffee( 2-3) Soda(  none) Tea ( none ) -no energy drinks. Regular exercise in form of walking, [employer] member  Hobbies : gardening        Sleep habits are as follows: The patient's dinner time is between 5 PM. The patient goes to bed at 11 PM and continues to sleep for 8 hours, wakes rarely bathroom breaks,and wakes still dizzy and not refreshed, going back to bed , feeling anxious, fleeing back to bed- and stays there for another 3-4 hours. She may get up at 2 PM.  The preferred sleep position is I on her right side or in supine, with the support of 1 pillow. Dreams are reportedly rare.  Noon- 2 PM is the usual rise time. The patient wakes up spontaneously.  She reports not feeling refreshed or restored in AM, with symptoms such as dry mouth, dizziness, anxiety, palpitations,   and residual fatigue.  Naps are taken frequently, lasting from 1-4 hours and are more refreshing than nocturnal sleep.   REVIEW OF SYSTEMS: Out of a complete 14 system review of symptoms, the patient complains only of the following symptoms, and all other reviewed systems are negative.   ESS 2  ALLERGIES: Allergies  Allergen Reactions   Imipramine Hives and Rash   Tricor [Fenofibrate] Other (See Comments)    Pain, "aching"    HOME MEDICATIONS: Outpatient Medications Prior to Visit  Medication Sig Dispense Refill   acetaminophen (TYLENOL) 650 MG CR tablet Take 650-1,300 mg by mouth See admin instructions. 1300MG  IN THE MORNING AND 650MG  AT BEDTIME     albuterol (VENTOLIN HFA) 108 (90 Base) MCG/ACT inhaler Inhale 1-2 puffs into the lungs every 6 (six) hours as needed for wheezing or shortness of breath. 18 g 0   ALPRAZolam (XANAX XR) 1 MG 24 hr tablet Take 1 mg by mouth in the morning and at bedtime.     ALPRAZolam (XANAX) 1 MG tablet Take 0.5 mg by mouth daily as needed for anxiety.      calcium carbonate (TUMS - DOSED IN MG ELEMENTAL CALCIUM) 500 MG chewable tablet Chew 1-3 tablets by mouth daily as needed for indigestion or  heartburn.     glucosamine-chondroitin 500-400 MG tablet Take 2 tablets by mouth every morning.      lamoTRIgine (LAMICTAL) 200 MG tablet Take 400 mg by mouth at bedtime.     Multiple Vitamin (MULTIVITAMIN) tablet Take 1 tablet by mouth daily.     QUEtiapine (SEROQUEL) 200 MG tablet Take 600 mg by mouth at bedtime.     rosuvastatin (CRESTOR) 10 MG tablet Take 1 tablet (10 mg total) by mouth daily. 90 tablet 3   telmisartan (MICARDIS) 20 MG tablet      Vitamin D, Ergocalciferol, (DRISDOL) 50000 units CAPS capsule Take 50,000 Units by mouth every Sunday.  No facility-administered medications prior to visit.    PAST MEDICAL HISTORY: Past Medical History:  Diagnosis Date   Anxiety    Bipolar 1 disorder ([place])    Breast cancer, left breast ([place]) 07/22/2011   Depression    Essential hypertension    Hyperlipidemia    IBS (irritable bowel syndrome)    Memory loss    OA (osteoarthritis)    Obesity    Personal history of chemotherapy 2009   Personal history of radiation therapy 2009   Polycystic ovarian disease    Prediabetes    Sinus node dysfunction ([place]) 12/2015   Biotronik pacemaker - Dr. Lovena Le   Sleep apnea     PAST SURGICAL HISTORY: Past Surgical History:  Procedure Laterality Date   ABDOMINAL HYSTERECTOMY     APPENDECTOMY     BREAST BIOPSY Left 2011   BREAST BIOPSY  2008   BREAST LUMPECTOMY Left 2008   CHOLECYSTECTOMY     COLONOSCOPY N/A 09/13/2013   Procedure: COLONOSCOPY;  Surgeon: [name], MD;  [facility];  Service: Endoscopy;  Laterality: N/A;  730   EP IMPLANTABLE DEVICE N/A 01/06/2016   Procedure: Pacemaker Implant;  Surgeon: [name], MD;  Location: [facility];  Service: Cardiovascular;  Laterality: N/A;   ESOPHAGOGASTRODUODENOSCOPY N/A 01/17/2014   Procedure: ESOPHAGOGASTRODUODENOSCOPY (EGD);  Surgeon: [name], MD;  [facility];  Service: Endoscopy;  Laterality: N/A;  245   RIGHT/LEFT HEART CATH AND CORONARY  ANGIOGRAPHY N/A 01/01/2020   Procedure: RIGHT/LEFT HEART CATH AND CORONARY ANGIOGRAPHY;  Surgeon: [name], MD;  Location: [facility];  Service: Cardiovascular;  Laterality: N/A;   TOTAL KNEE ARTHROPLASTY Bilateral right knee   2012, 2007    FAMILY HISTORY: Family History  Problem Relation Age of Onset   Bipolar disorder Mother    Diabetes Mother    Bipolar disorder Sister    Diabetes Sister    Alcohol abuse Father    Hypertension Father    Heart disease Other     SOCIAL HISTORY: Social History   Socioeconomic History   Marital status: Married    Spouse name: Not on file   Number of children: 3   Years of education: college   Highest education level: Associate degree: occupational, Hotel manager, or vocational program  Occupational History   Occupation: Therapist, sports  Tobacco Use   Smoking status: Never   Smokeless tobacco: Never  Vaping Use   Vaping Use: Never used  Substance and Sexual Activity   Alcohol use: No    Alcohol/week: 0.0 standard drinks   Drug use: No   Sexual activity: Never  Other Topics Concern   Not on file  Social History Narrative   04/24/2013 [name] was born and grew up in [place]. She reports that her childhood was "tough." She has 2 older sisters and a younger brother. She achieved an Associates Degree in nursing. 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 She has 2 daughters and one son. She denies any legal difficulties. She affiliates as Engineer, manufacturing. Her hobbies include reading, sewing, crafts, and cooking. She reports that her social support system consists of her friend and passed her. 04/24/2013 [name]      Lives alone.   Right-handed.   No caffeine use.   Social Determinants of Health   Financial Resource Strain: Not on file  Food Insecurity: Not on file  Transportation Needs: Not on file  Physical Activity: Not on file  Stress: Not on file  Social Connections: Not on  file  Intimate Partner Violence: Not on file      PHYSICAL EXAM  Vitals:   01/07/21 0819  Weight: 279 lb (126.6 kg)  Height: 5\' 4"  (1.626 m)   Body mass index is 47.89 kg/m.  Generalized: Well developed, in no acute distress  Chest: Lungs clear to auscultation bilaterally  Neurological examination  Mentation: Alert oriented to time, place, history taking. Follows all commands speech and language fluent Cranial nerve II-XII: Extraocular movements were full, visual field were full on confrontational test Head turning and shoulder shrug  were normal and symmetric. Motor: The motor testing reveals 5 over 5 strength of all 4 extremities. Good symmetric motor tone is noted throughout.  Sensory: Sensory testing is intact to soft touch on all 4 extremities. No evidence of extinction is noted.  Gait and station: Gait is normal.    DIAGNOSTIC DATA (LABS, IMAGING, TESTING) - I reviewed patient records, labs, notes, testing and imaging myself where available.  Lab Results  Component Value Date   WBC 6.2 01/02/2020   HGB 13.4 01/02/2020   HCT 41.5 01/02/2020   MCV 96.1 01/02/2020   PLT 248 01/02/2020      Component Value Date/Time   NA 141 12/03/2020 0845   K 4.4 12/03/2020 0845   CL 100 12/03/2020 0845   CO2 24 12/03/2020 0845   GLUCOSE 112 (H) 12/03/2020 0845   GLUCOSE 134 (H) 02/06/2020 0859   BUN 16 12/03/2020 0845   CREATININE 0.90 12/03/2020 0845   CREATININE 0.80 12/30/2015 1138   CALCIUM 9.8 12/03/2020 0845   PROT 6.9 12/03/2020 0845   ALBUMIN 4.7 12/03/2020 0845   AST 22 12/03/2020 0845   ALT 16 12/03/2020 0845   ALKPHOS 111 12/03/2020 0845   BILITOT 0.8 12/03/2020 0845   GFRNONAA 73 06/30/2020 0848   GFRAA 84 06/30/2020 0848   Lab Results  Component Value Date   CHOL 139 12/03/2020   HDL 40 12/03/2020   LDLCALC 67 12/03/2020   TRIG 194 (H) 12/03/2020   CHOLHDL 3.5 12/03/2020   Lab Results  Component Value Date   HGBA1C 6.0 (H) 02/08/2020   Lab  Results  Component Value Date   XKPVVZSM27 078 12/12/2017   Lab Results  Component Value Date   TSH 1.580 01/15/2020      ASSESSMENT AND PLAN 70 y.o. year old female  has a past medical history of Anxiety, Bipolar 1 disorder ([place]), Breast cancer, left breast ([place]) (07/22/2011), Depression, Essential hypertension, Hyperlipidemia, IBS (irritable bowel syndrome), Memory loss, OA (osteoarthritis), Obesity, Personal history of chemotherapy (2009), Personal history of radiation therapy (2009), Polycystic ovarian disease, Prediabetes, Sinus node dysfunction ([place]) (12/2015), and Sleep apnea. here with:  OSA on CPAP  - CPAP compliance excellent - Good treatment of AHI  - Encourage patient to use CPAP nightly and > 4 hours each night - F/U in 1 year or sooner if needed   Ward Givens, MSN, NP-C 01/07/2021, 8:31 AM [facility] [address], [place], [place] [address] [phone]

## 2021-01-08 ENCOUNTER — Encounter (INDEPENDENT_AMBULATORY_CARE_PROVIDER_SITE_OTHER): Payer: PPO | Admitting: Ophthalmology

## 2021-01-11 DIAGNOSIS — G4733 Obstructive sleep apnea (adult) (pediatric): Secondary | ICD-10-CM | POA: Diagnosis not present

## 2021-01-13 ENCOUNTER — Ambulatory Visit: Payer: PPO | Admitting: Adult Health

## 2021-01-20 NOTE — Progress Notes (Signed)
Remote pacemaker transmission.   

## 2021-01-26 ENCOUNTER — Emergency Department (HOSPITAL_COMMUNITY): Payer: PPO

## 2021-01-26 ENCOUNTER — Encounter (HOSPITAL_COMMUNITY): Payer: Self-pay

## 2021-01-26 ENCOUNTER — Other Ambulatory Visit: Payer: Self-pay

## 2021-01-26 ENCOUNTER — Emergency Department (HOSPITAL_COMMUNITY)
Admission: EM | Admit: 2021-01-26 | Discharge: 2021-01-26 | Disposition: A | Discharge status: Home / Self Care | Payer: PPO | Attending: Emergency Medicine | Admitting: Emergency Medicine

## 2021-01-26 DIAGNOSIS — R6889 Other general symptoms and signs: Secondary | ICD-10-CM | POA: Diagnosis not present

## 2021-01-26 DIAGNOSIS — Z7982 Long term (current) use of aspirin: Secondary | ICD-10-CM | POA: Insufficient documentation

## 2021-01-26 DIAGNOSIS — Z853 Personal history of malignant neoplasm of breast: Secondary | ICD-10-CM | POA: Insufficient documentation

## 2021-01-26 DIAGNOSIS — I10 Essential (primary) hypertension: Secondary | ICD-10-CM | POA: Diagnosis not present

## 2021-01-26 DIAGNOSIS — Z743 Need for continuous supervision: Secondary | ICD-10-CM | POA: Diagnosis not present

## 2021-01-26 DIAGNOSIS — R0789 Other chest pain: Secondary | ICD-10-CM | POA: Diagnosis not present

## 2021-01-26 DIAGNOSIS — Z79899 Other long term (current) drug therapy: Secondary | ICD-10-CM | POA: Insufficient documentation

## 2021-01-26 DIAGNOSIS — Z96653 Presence of artificial knee joint, bilateral: Secondary | ICD-10-CM | POA: Diagnosis not present

## 2021-01-26 DIAGNOSIS — R079 Chest pain, unspecified: Secondary | ICD-10-CM | POA: Diagnosis not present

## 2021-01-26 LAB — D-DIMER, QUANTITATIVE: D-Dimer, Quant: 0.44 ug/mL-FEU (ref 0.00–0.50)

## 2021-01-26 LAB — CBC
HCT: 41.1 % (ref 36.0–46.0)
Hemoglobin: 13.2 g/dL (ref 12.0–15.0)
MCH: 32.1 pg (ref 26.0–34.0)
MCHC: 32.1 g/dL (ref 30.0–36.0)
MCV: 100 fL (ref 80.0–100.0)
Platelets: 206 10*3/uL (ref 150–400)
RBC: 4.11 MIL/uL (ref 3.87–5.11)
RDW: 13.2 % (ref 11.5–15.5)
WBC: 6.1 10*3/uL (ref 4.0–10.5)
nRBC: 0 % (ref 0.0–0.2)

## 2021-01-26 LAB — TROPONIN I (HIGH SENSITIVITY)
Troponin I (High Sensitivity): 2 ng/L (ref <18)
Troponin I (High Sensitivity): 3 ng/L (ref <18)

## 2021-01-26 LAB — HEPATIC FUNCTION PANEL
ALT: 23 U/L (ref 0–44)
AST: 30 U/L (ref 15–41)
Albumin: 4.2 g/dL (ref 3.5–5.0)
Alkaline Phosphatase: 77 U/L (ref 38–126)
Bilirubin, Direct: 0.2 mg/dL (ref 0.0–0.2)
Indirect Bilirubin: 0.4 mg/dL (ref 0.3–0.9)
Total Bilirubin: 0.6 mg/dL (ref 0.3–1.2)
Total Protein: 7 g/dL (ref 6.5–8.1)

## 2021-01-26 LAB — BASIC METABOLIC PANEL
Anion gap: 8 (ref 5–15)
BUN: 21 mg/dL (ref 8–23)
CO2: 28 mmol/L (ref 22–32)
Calcium: 9.9 mg/dL (ref 8.9–10.3)
Chloride: 102 mmol/L (ref 98–111)
Creatinine, Ser: 0.79 mg/dL (ref 0.44–1.00)
GFR, Estimated: >60 mL/min (ref >60)
Glucose, Bld: 162 mg/dL — ABNORMAL HIGH (ref 70–99)
Potassium: 4.8 mmol/L (ref 3.5–5.1)
Sodium: 138 mmol/L (ref 135–145)

## 2021-01-26 MED ORDER — PANTOPRAZOLE SODIUM 40 MG IV SOLR
40.0000 mg | Freq: Once | INTRAVENOUS | Status: AC
  Administered 2021-01-26: 40 mg via INTRAVENOUS
  Filled 2021-01-26: qty 40

## 2021-01-26 NOTE — ED Provider Notes (Signed)
[facility] Provider Note   CSN: [id number] Arrival date & time: 01/26/21  1001     History Chief Complaint  Patient presents with  . Chest Pain    [name] is a 70 y.o. female.  Patient states that she had left-sided chest pain today that lasted 30 minutes.  She also states she has had a cardiac cath within the last few months that was unremarkable  The history is provided by the patient and medical records. No language interpreter was used.  Chest Pain Pain location:  L chest Pain quality: aching   Pain radiates to:  Does not radiate Pain severity:  Moderate Onset quality:  Sudden Duration:  30 minutes Timing:  Intermittent Progression:  Resolved Chronicity:  New Context: not breathing   Relieved by:  Nothing Worsened by:  Nothing Associated symptoms: no abdominal pain, no back pain, no cough, no fatigue and no headache       Past Medical History:  Diagnosis Date  . Anxiety   . Bipolar 1 disorder ([place])   . Breast cancer, left breast ([place]) 07/22/2011  . Depression   . Essential hypertension   . Hyperlipidemia   . IBS (irritable bowel syndrome)   . Memory loss   . OA (osteoarthritis)   . Obesity   . Personal history of chemotherapy 2009  . Personal history of radiation therapy 2009  . Polycystic ovarian disease   . Prediabetes   . Sinus node dysfunction ([facility]) 12/2015   Biotronik pacemaker - Dr. Lovena Le  . Sleep apnea     Patient Active Problem List   Diagnosis Date Noted  . 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 Cotton wool spots 12/01/2020  . Obstructive sleep apnea of adult 08/26/2020  . Cystoid macular edema, right eye 08/12/2020  . Intermediate stage nonexudative age-related macular degeneration of both eyes 08/12/2020  . Vitreomacular adhesion of right eye 08/12/2020  . Serous retinal detachment of right eye 08/12/2020  . Type 2 macular telangiectasis, 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  . Essential hypertension  03/06/2020  . Prediabetes 02/10/2020  . Cardiac pacemaker in situ 01/07/2020  . Bipolar 2 disorder, major depressive episode ([place]) 01/07/2020  . At risk for central sleep apnea 01/07/2020  . Obesity with alveolar hypoventilation and body mass index (BMI) of 40 or greater ([place]) 01/07/2020  . Precordial chest pain 12/31/2019  . DOE (dyspnea on exertion) 12/05/2019  . Chronic diastolic CHF (congestive heart failure) ([place]) 12/05/2019  . Memory loss 12/12/2017  . Morbid obesity ([place]) 09/05/2017  . Sinus node dysfunction ([place]) 01/06/2016  . Bradycardia 10/22/2015  . Bipolar disorder ([place]) 10/22/2015  . Anxiety 10/22/2015  . Pre-syncope 10/22/2015  . Episodic lightheadedness   . GERD (gastroesophageal reflux disease) 01/08/2014  . Orthostatic hypotension - resolved 11/28/2013  . Palpitations 11/28/2013  . Osteoarthritis 11/08/2013  . Lumbar back pain 11/08/2013  . Hypertriglyceridemia 08/06/2013  . Depression 04/20/2013  . History of left breast cancer (2013) 07/22/2011    Past Surgical History:  Procedure Laterality Date  . ABDOMINAL HYSTERECTOMY    . APPENDECTOMY    . BREAST BIOPSY Left 2011  . BREAST BIOPSY  2008  . BREAST LUMPECTOMY Left 2008  . CHOLECYSTECTOMY    . COLONOSCOPY N/A 09/13/2013   Procedure: COLONOSCOPY;  Surgeon: [name], MD;  [facility];  Service: Endoscopy;  Laterality: N/A;  730  . EP IMPLANTABLE DEVICE N/A 01/06/2016   Procedure: Pacemaker Implant;  Surgeon: [name], MD;  Location: [facility];  Service: Cardiovascular;  Laterality: N/A;  . ESOPHAGOGASTRODUODENOSCOPY N/A 01/17/2014   Procedure: ESOPHAGOGASTRODUODENOSCOPY (EGD);  Surgeon: [name], MD;  [facility];  Service: Endoscopy;  Laterality: N/A;  245  . RIGHT/LEFT HEART CATH AND CORONARY ANGIOGRAPHY N/A 01/01/2020   Procedure: RIGHT/LEFT HEART CATH AND CORONARY ANGIOGRAPHY;  Surgeon: [name], MD;  Location: [facility];  Service:  Cardiovascular;  Laterality: N/A;  . TOTAL KNEE ARTHROPLASTY Bilateral right knee   2012, 2007     OB History   No obstetric history on file.     Family History  Problem Relation Age of Onset  . Bipolar disorder Mother   . Diabetes Mother   . Bipolar disorder Sister   . Diabetes Sister   . Alcohol abuse Father   . Hypertension Father   . Heart disease Other     Social History   Tobacco Use  . Smoking status: Never  . Smokeless tobacco: Never  Vaping Use  . Vaping Use: Never used  Substance Use Topics  . Alcohol use: No    Alcohol/week: 0.0 standard drinks  . Drug use: No    Home Medications Prior to Admission medications   Medication Sig Start Date End Date Taking? Authorizing Provider  acetaminophen (TYLENOL) 650 MG CR tablet Take 650-1,300 mg by mouth See admin instructions. 1300MG  IN THE MORNING AND 650MG  AT BEDTIME    [provider]  albuterol (VENTOLIN HFA) 108 (90 Base) MCG/ACT inhaler Inhale 1-2 puffs into the lungs every 6 (six) hours as needed for wheezing or shortness of breath. 02/28/20   Avegno, Darrelyn Hillock, FNP  ALPRAZolam (XANAX XR) 1 MG 24 hr tablet Take 1 mg by mouth in the morning and at bedtime.    [provider]  ALPRAZolam [name]) 1 MG tablet Take 0.5 mg by mouth daily as needed for anxiety.     [provider]  aspirin 325 MG EC tablet Take 325 mg by mouth daily.    [provider]  calcium carbonate (TUMS - DOSED IN MG ELEMENTAL CALCIUM) 500 MG chewable tablet Chew 1-3 tablets by mouth daily as needed for indigestion or heartburn.    [provider]  glucosamine-chondroitin 500-400 MG tablet Take 2 tablets by mouth every morning.     [provider]  lamoTRIgine (LAMICTAL) 200 MG tablet Take 400 mg by mouth at bedtime.    [provider]  Multiple Vitamin (MULTIVITAMIN) tablet Take 1 tablet by mouth daily.    [provider]  QUEtiapine (SEROQUEL) 200 MG tablet Take 600 mg by  mouth at bedtime. 05/07/13   [name], MD  rosuvastatin (CRESTOR) 10 MG tablet Take 1 tablet (10 mg total) by mouth daily. 12/02/20 03/02/21  [name], MD  telmisartan (MICARDIS) 20 MG tablet     [provider]  Vitamin D, Ergocalciferol, (DRISDOL) 50000 units CAPS capsule Take 50,000 Units by mouth every Sunday.     [provider]    Allergies    Imipramine and Tricor [fenofibrate]  Review of Systems   Review of Systems  Constitutional:  Negative for appetite change and fatigue.  HENT:  Negative for congestion, ear discharge and sinus pressure.   Eyes:  Negative for discharge.  Respiratory:  Negative for cough.   Cardiovascular:  Positive for chest pain.  Gastrointestinal:  Negative for abdominal pain and diarrhea.  Genitourinary:  Negative for frequency and hematuria.  Musculoskeletal:  Negative for back pain.  Skin:  Negative for rash.  Neurological:  Negative for seizures and headaches.  Psychiatric/Behavioral:  Negative for hallucinations.    Physical Exam Updated Vital Signs BP 130/74   Pulse 67   Temp 97.9 F (36.6 C) (Oral)   Resp (!) 21   Ht 5\' 4"  (1.626 m)   Wt 122.5 kg   SpO2 98%   BMI 46.35 kg/m   Physical Exam Vitals and nursing note reviewed.  Constitutional:      Appearance: She is well-developed.  HENT:     Head: Normocephalic.     Nose: Nose normal.  Eyes:     General: No scleral icterus.    Conjunctiva/sclera: Conjunctivae normal.  Neck:     Thyroid: No thyromegaly.  Cardiovascular:     Rate and Rhythm: Normal rate and regular rhythm.     Heart sounds: No murmur heard.   No friction rub. No gallop.  Pulmonary:     Breath sounds: No stridor. No wheezing or rales.  Chest:     Chest wall: No tenderness.  Abdominal:     General: There is no distension.     Tenderness: There is no abdominal tenderness. There is no rebound.  Musculoskeletal:        General: Normal range of motion.     Cervical back: Neck  supple.  Lymphadenopathy:     Cervical: No cervical adenopathy.  Skin:    Findings: No erythema or rash.  Neurological:     Mental Status: She is alert and oriented to person, place, and time.     Motor: No abnormal muscle tone.     Coordination: Coordination normal.  Psychiatric:        Behavior: Behavior normal.    ED Results / Procedures / Treatments   Labs (all labs ordered are listed, but only abnormal results are displayed) Labs Reviewed  BASIC METABOLIC PANEL - Abnormal; Notable for the following components:      Result Value   Glucose, Bld 162 (*)    All other components within normal limits  CBC  HEPATIC FUNCTION PANEL  D-DIMER, QUANTITATIVE  TROPONIN I (HIGH SENSITIVITY)  TROPONIN I (HIGH SENSITIVITY)    EKG None  Radiology DG Chest 2 View  Result Date: 01/26/2021 CLINICAL DATA:  Chest pain. EXAM: CHEST - 2 VIEW COMPARISON:  02/28/2020 FINDINGS: The lungs are clear without focal pneumonia, edema, pneumothorax or pleural effusion. The cardiopericardial silhouette is within normal limits for size. The visualized bony structures of the thorax show no acute abnormality. Permanent pacemaker again noted. Telemetry leads overlie the chest. IMPRESSION: No active cardiopulmonary disease. Electronically Signed   By: Misty Stanley M.D.   On: 01/26/2021 11:37    Procedures Procedures   Medications Ordered in ED Medications  pantoprazole (PROTONIX) injection 40 mg (40 mg Intravenous Given 01/26/21 1154)    ED Course  I have reviewed the triage vital signs and the nursing notes.  Pertinent labs & imaging results that were available during my care of the patient were reviewed by me and considered in my medical decision making (see chart for details).    MDM Rules/Calculators/A&P                          Patient with 2 negative troponins and normal D-dimer.  Chest pain resolved.  Doubt coronary artery disease.  Patient will follow-up with PCP.  She has been on GERD  medicine before but  states that she would prefer to stay off of the right now Final Clinical Impression(s) / ED Diagnoses Final diagnoses:  Atypical chest pain    Rx / DC Orders ED Discharge Orders     None        Milton Ferguson, MD 01/26/21 1337

## 2021-01-26 NOTE — ED Triage Notes (Addendum)
Pt arrived [facility]  from home with c/o chest pain that radiated to left side. Pt took two 325 mg of aspirin EC at home around 9 am when pain started. EMS gave 1 nitro about 0940.

## 2021-01-26 NOTE — ED Notes (Signed)
Pt verbalized she had a stroke in her left eye and followed by Dr. Zadie Rhine.

## 2021-01-26 NOTE — Discharge Instructions (Addendum)
Follow-up with your family doctor or your cardiologist in the next week.  Return if any problems

## 2021-02-02 ENCOUNTER — Encounter: Payer: Self-pay | Admitting: Family Medicine

## 2021-02-02 DIAGNOSIS — E785 Hyperlipidemia, unspecified: Secondary | ICD-10-CM

## 2021-02-02 DIAGNOSIS — R7303 Prediabetes: Secondary | ICD-10-CM

## 2021-02-03 NOTE — Telephone Encounter (Signed)
Nurses I recommend A1c and lipid  Prediabetes hyperlipidemia

## 2021-02-04 NOTE — Addendum Note (Signed)
Addended by: Vicente Males on: 02/04/2021 10:02 AM   Modules accepted: Orders

## 2021-02-05 ENCOUNTER — Encounter (INDEPENDENT_AMBULATORY_CARE_PROVIDER_SITE_OTHER): Payer: Self-pay | Admitting: *Deleted

## 2021-02-05 ENCOUNTER — Other Ambulatory Visit: Payer: Self-pay

## 2021-02-05 ENCOUNTER — Ambulatory Visit (INDEPENDENT_AMBULATORY_CARE_PROVIDER_SITE_OTHER): Payer: PPO | Admitting: Family Medicine

## 2021-02-05 VITALS — BP 96/62 | Temp 97.3°F | Ht 64.0 in | Wt 276.0 lb

## 2021-02-05 DIAGNOSIS — E785 Hyperlipidemia, unspecified: Secondary | ICD-10-CM | POA: Diagnosis not present

## 2021-02-05 DIAGNOSIS — Z1211 Encounter for screening for malignant neoplasm of colon: Secondary | ICD-10-CM

## 2021-02-05 DIAGNOSIS — R0789 Other chest pain: Secondary | ICD-10-CM | POA: Diagnosis not present

## 2021-02-05 DIAGNOSIS — R7303 Prediabetes: Secondary | ICD-10-CM | POA: Diagnosis not present

## 2021-02-05 MED ORDER — TELMISARTAN 20 MG PO TABS: 10.0000 mg | ORAL_TABLET | Freq: Every day | ORAL | 3 refills | Status: DC

## 2021-02-05 NOTE — Progress Notes (Signed)
   Subjective:    Patient ID: [name], female    DOB: 04/07/1951, 71 y.o.   MRN: [id number]  Hypertension This is a chronic problem. The current episode started more than 1 year ago. Risk factors for coronary artery disease include dyslipidemia and post-menopausal state.   Patient had he labs drawn this am before her appt. Patient was in ER a week ago for atypical chest pain- ruled non cardiac- muscle cramp- follows up with Cardiology Monday  Review of Systems A1c and lipid ordered await results    Objective:   Physical Exam Lungs clear heart regular pulse normal extremities no edema   ER record labs were reviewed with patient and discussed with patient    Assessment & Plan:  Patient needs colonoscopy go forward with this Musculoskeletal chest pain no need for any further testing Continue current measures

## 2021-02-06 ENCOUNTER — Other Ambulatory Visit: Payer: Self-pay | Admitting: Family Medicine

## 2021-02-06 DIAGNOSIS — Z1231 Encounter for screening mammogram for malignant neoplasm of breast: Secondary | ICD-10-CM

## 2021-02-06 LAB — LIPID PANEL
Chol/HDL Ratio: 2.3 ratio (ref 0.0–4.4)
Cholesterol, Total: 96 mg/dL — ABNORMAL LOW (ref 100–199)
HDL: 41 mg/dL (ref >39)
LDL Chol Calc (NIH): 25 mg/dL (ref 0–99)
Triglycerides: 188 mg/dL — ABNORMAL HIGH (ref 0–149)
VLDL Cholesterol Cal: 30 mg/dL (ref 5–40)

## 2021-02-06 LAB — HEMOGLOBIN A1C
Est. average glucose Bld gHb Est-mCnc: 123 mg/dL
Hgb A1c MFr Bld: 5.9 % — ABNORMAL HIGH (ref 4.8–5.6)

## 2021-02-11 ENCOUNTER — Other Ambulatory Visit (HOSPITAL_COMMUNITY): Payer: PPO

## 2021-02-11 DIAGNOSIS — G4733 Obstructive sleep apnea (adult) (pediatric): Secondary | ICD-10-CM | POA: Diagnosis not present

## 2021-02-11 NOTE — Progress Notes (Signed)
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 Lovena Le, chronic diastolic CHF, sleep apnea, hypertension,hyperlipidemia, anxiety/depression/bipolar disorder.   Patient had worsening dyspnea on exertion echo 12/05/2019 LVEF 65 to 70% with normal wall motion mild LVH and grade 1 DD.  Myoview 12/14/2019 was difficult to interpret a small reversible apical anterior defect consistent with ischemia but in the setting of breast attenuation.  The ED 12/31/2019 with chest pain shortness of breath and nausea.  Cardiac cath showed normal coronary arteries and LV function.  Patient diuresed.  Dyspnea was felt to be multifactorial due to diastolic dysfunction, deconditioning, and obesity.  Lasix increased prior to discharge.  She did become orthostatic in the hospital and amlodipine was stopped with resolution of dizziness and orthostasis.   I saw patient 01/21/20 and BP running high.would improve after taking her psych meds at night. I restarted her lisinopril 5 mg once daily.  Last saw Dr. Domenic Polite 08/2020 DOE chronic and encourage to start exercise program. 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 Started on Crestor 10 mg once daily.   In ER 01/26/21 with atypical chest pain, troponins negative.  Patient thinks the pain was from shoulder and M-S. No further chest pain. Chronic DOE. Going to [facility] 4 times a week. Doing 1 hr on elliptical and bike. Feeling stronger. Also starting a  diabetes prevention program. Micardis decreased by Dr. Wolfgang Phoenix a 1 /1/2 weeks ago for Low BP and now BP sometimes 130/90. Checking BP before she takes meds. Getting some extra salt but  still  getting some.        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 Lovena Le   Sleep apnea     Past Surgical History:  Procedure Laterality Date   ABDOMINAL HYSTERECTOMY     APPENDECTOMY     BREAST BIOPSY Left 2011   BREAST BIOPSY  2008   BREAST LUMPECTOMY Left 2008   CHOLECYSTECTOMY     COLONOSCOPY N/A 09/13/2013   Procedure: COLONOSCOPY;  Surgeon: [name], MD;  [facility];  Service: Endoscopy;  Laterality: N/A;  730   EP IMPLANTABLE DEVICE N/A 01/06/2016   Procedure: Pacemaker Implant;  Surgeon: [name], MD;  Location: [place] [facility];  Service: Cardiovascular;  Laterality: N/A;   ESOPHAGOGASTRODUODENOSCOPY N/A 01/17/2014   Procedure: ESOPHAGOGASTRODUODENOSCOPY (EGD);  Surgeon:  [name], MD;  [facility]: [facility];  Service: Endoscopy;  Laterality: N/A;  245   RIGHT/LEFT HEART CATH AND CORONARY ANGIOGRAPHY N/A 01/01/2020   Procedure: RIGHT/LEFT HEART CATH AND CORONARY ANGIOGRAPHY;  Surgeon: [name], MD;  Location: [facility];  Service: Cardiovascular;  Laterality: N/A;   TOTAL KNEE ARTHROPLASTY Bilateral right knee   2012, 2007    Current Medications: Current Meds  Medication Sig   acetaminophen (TYLENOL) 650 MG CR tablet Take 650-1,300 mg by mouth See admin  instructions. '1300MG'$  IN THE MORNING AND '650MG'$  AT BEDTIME   albuterol (VENTOLIN HFA) 108 (90 Base) MCG/ACT inhaler Inhale 1-2 puffs into the lungs every 6 (six) hours as needed for wheezing or shortness of breath.   ALPRAZolam (XANAX XR) 1 MG 24 hr tablet Take 1 mg by mouth in the morning and at bedtime.   ALPRAZolam (XANAX) 1 MG tablet Take 0.5 mg by mouth daily as needed for anxiety.    aspirin 325 MG EC tablet Take 325 mg by mouth daily.   glucosamine-chondroitin 500-400 MG tablet Take 2 tablets by mouth every morning.    lamoTRIgine (LAMICTAL) 200 MG tablet Take 400 mg by mouth at bedtime.   Multiple Vitamin (MULTIVITAMIN) tablet Take 1 tablet by mouth daily.   QUEtiapine (SEROQUEL) 200 MG tablet Take 600 mg by mouth at bedtime.   rosuvastatin (CRESTOR) 10 MG tablet Take 1 tablet (10 mg total) by mouth daily.   telmisartan (MICARDIS) 20 MG tablet Take 0.5 tablets (10 mg total) by mouth daily.   Vitamin D, Ergocalciferol, (DRISDOL) 50000 units CAPS capsule Take 50,000 Units by mouth every Sunday.      Allergies:   Imipramine and Tricor [fenofibrate]   Social History   Socioeconomic History   Marital status: Married    Spouse name: Not on file   Number of children: 3   Years of education: college   Highest education level: Associate degree: occupational, Hotel manager, or vocational program  Occupational History   Occupation: Therapist, sports  Tobacco Use   Smoking status: Never   Smokeless tobacco: Never  Vaping Use   Vaping Use: Never used  Substance and Sexual Activity   Alcohol use: No    Alcohol/week: 0.0 standard drinks   Drug use: No   Sexual activity: Not Currently  Other Topics Concern   Not on file  Social History Narrative   04/24/2013 [name] was born and grew up in [place]. She reports that her childhood was "tough." She has 2 older sisters and a younger brother. She achieved an Associates Degree in nursing. She has been working as an Therapist, sports for 40 years. She is separated  from her husband for 6 years. She has 2 daughters and one son. She denies any legal difficulties. She affiliates as Engineer, manufacturing. Her hobbies include reading, sewing, crafts, and cooking. She reports that her social support system consists of her friend and passed her. 04/24/2013 [name]      Lives alone.   Right-handed.   No caffeine use.   Social Determinants of Health   Financial Resource Strain: Not on file  Food Insecurity: Not on file  Transportation Needs: Not on file  Physical Activity: Not on file  Stress: Not on file  Social Connections: Not on file     Family History:  The patient's  family history includes Alcohol abuse in her father; Bipolar disorder in her mother and sister; Diabetes in her mother and sister; Heart disease in  an other family member; Hypertension in her father.   ROS:   Please see the history of present illness.    ROS All other systems reviewed and are negative.   PHYSICAL EXAM:   VS:  BP 130/80   Pulse 70   Ht '5\' 4"'$  (1.626 m)   Wt 277 lb (125.6 kg)   SpO2 95%   BMI 47.55 kg/m   Physical Exam  GEN: Obese, in no acute distress  Neck: no JVD, carotid bruits, or masses Cardiac:RRR; no murmurs, rubs, or gallops  Respiratory:  clear to auscultation bilaterally, normal work of breathing GI: soft, nontender, nondistended, + BS Ext: without cyanosis, clubbing, or edema, Good distal pulses bilaterally Neuro:  Alert and Oriented x 3, Psych: euthymic mood, full affect  Wt Readings from Last 3 Encounters:  02/16/21 277 lb (125.6 kg)  02/05/21 276 lb (125.2 kg)  01/26/21 270 lb (122.5 kg)      Studies/Labs Reviewed:   EKG:  EKG is not ordered today.    Recent Labs: 01/26/2021: ALT 23; BUN 21; Creatinine, Ser 0.79; Hemoglobin 13.2; Platelets 206; Potassium 4.8; Sodium 138   Lipid Panel    Component Value Date/Time   CHOL 96 (L) 02/05/2021 0803   TRIG 188 (H) 02/05/2021 0803   HDL 41 02/05/2021 0803   CHOLHDL 2.3 02/05/2021 0803   CHOLHDL 5.0  02/06/2020 0859   VLDL 46 (H) 02/06/2020 0859   LDLCALC 25 02/05/2021 0803    Additional studies/ records that were reviewed today include:  Zio 12/2020 Study Highlights  ZIO XT reviewed.  13 days, 16 hours analyzed.  Predominant rhythm is sinus with intermittent atrial pacing and also episodes of ventricular pacing.  Heart rate ranged from 58 bpm up to 122 bpm with average heart rate 70 bpm.  Some junctional rhythm was also noted with heart rate generally in the 60s to 70s.  There were rare PACs including couplets and triplets representing less than 1% total beats.  There were rare PVCs representing less than 1% total beats and a brief episode of ventricular trigeminy.  There were no pauses.   Carotid dopplers 12/2020  IMPRESSION: 1. Bilateral carotid bifurcation plaque resulting in less than 50% diameter ICA stenosis. 2. Antegrade bilateral vertebral arterial flow.     Electronically Signed   By: Lucrezia Europe M.D.   On: 12/11/2020 10:45   Echocardiogram 12/05/2019:  1. Left ventricular ejection fraction, by estimation, is 65 to 70%. The  left ventricle has normal function. The left ventricle has no regional  wall motion abnormalities. There is mild left ventricular hypertrophy.  Left ventricular diastolic parameters  are consistent with Grade I diastolic dysfunction (impaired relaxation).   2. Right ventricular systolic function is normal. The right ventricular  size is normal. Tricuspid regurgitation signal is inadequate for assessing  PA pressure.   3. The mitral valve is grossly normal. Trivial mitral valve  regurgitation.   4. The aortic valve is tricuspid. Aortic valve regurgitation is not  visualized.   5. The inferior vena cava is normal in size with greater than 50%  respiratory variability, suggesting right atrial pressure of 3 mmHg.   Right and left heart catheterization 01/01/2020: Flowsheet Row Most Recent Value  Fick Cardiac Output 6.71 L/min  Fick Cardiac Output  Index 2.97 (L/min)/BSA  RA A Wave 4 mmHg  RA V Wave 2 mmHg  RA Mean 1 mmHg  RV Systolic Pressure 30 mmHg  RV Diastolic Pressure 2 mmHg  RV EDP 5  mmHg  PA Systolic Pressure 34 mmHg  PA Diastolic Pressure 12 mmHg  PA Mean 20 mmHg  PW A Wave 9 mmHg  PW V Wave 9 mmHg  PW Mean 7 mmHg  AO Systolic Pressure 123XX123 mmHg  AO Diastolic Pressure 74 mmHg  AO Mean [id number] mmHg  LV Systolic Pressure [id number] mmHg  LV Diastolic Pressure 6 mmHg  LV EDP 9 mmHg  AOp Systolic Pressure 0000000 mmHg  AOp Diastolic Pressure 75 mmHg  AOp Mean Pressure A999333 mmHg  LVp Systolic Pressure Q000111Q mmHg  LVp Diastolic Pressure 2 mmHg  LVp EDP Pressure 7 mmHg  QP/QS 1  TPVR Index 6.72 HRUI  TSVR Index 35.65 HRUI  PVR SVR Ratio 0.12  TPVR/TSVR Ratio 0.19    Normal coronary arteries Normal filling pressures   No further ischemic workup   Carotid Dopplers 03/03/2020: IMPRESSION: Right:   Heterogeneous plaque at the right carotid bifurcation, with discordant results regarding degree of stenosis by established duplex criteria. Peak velocity suggests 50%-69% stenosis, with the ICA/ CCA ratio suggesting a lesser degree of stenosis. If establishing a more accurate degree of stenosis is required, cerebral angiogram should be considered, or as a second best test, CTA.   Left:   Color duplex indicates minimal heterogeneous plaque, with no hemodynamically significant stenosis by duplex criteria in the extracranial cerebrovascular circulation.           Risk Assessment/Calculations:         ASSESSMENT:    1. Shortness of breath   2. History of chest pain   3. Chronic diastolic CHF (congestive heart failure) ([place])   4. Sinus node dysfunction ([facility])   5. Essential hypertension   6. Retinal artery occlusion      PLAN:  In order of problems listed above:  Dyspnea on exertion felt to be multifactorial including deconditioning diastolic dysfunction and obesity-feeling better exercising 4 days a  week.  History of chest pain normal cardiac cath 12/2019 normal LV function as well-no recent chest pain  Chronic diastolic CHF compensated  History of sinus dysfunction status post pacemaker followed by Dr. Lovena Le  Hypertension BP was running low and micardis decreased by Dr. Wolfgang Phoenix. She's had some high readings but checking before she takes her meds. I've asked her to check after her meds and 2 gm sodium diet.   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 fib.  Shared Decision Making/Informed Consent        Medication Adjustments/Labs and Tests Ordered: Current medicines are reviewed at length with the patient today.  Concerns regarding medicines are outlined above.  Medication changes, Labs and Tests ordered today are listed in the Patient Instructions below. There are no Patient Instructions on file for this visit.   Signed, Ermalinda Barrios, PA-C  02/16/2021 1:55 PM    [facility] [address], [place], [place]  [address] Phone: [phone]; Fax: [phone]

## 2021-02-13 ENCOUNTER — Ambulatory Visit (HOSPITAL_COMMUNITY): Payer: PPO

## 2021-02-16 ENCOUNTER — Ambulatory Visit: Payer: PPO | Admitting: Physician Assistant

## 2021-02-16 ENCOUNTER — Encounter: Payer: Self-pay | Admitting: Family Medicine

## 2021-02-16 ENCOUNTER — Other Ambulatory Visit: Payer: Self-pay

## 2021-02-16 ENCOUNTER — Encounter: Payer: Self-pay | Admitting: Physician Assistant

## 2021-02-16 VITALS — BP 130/80 | HR 70 | Ht 64.0 in | Wt 277.0 lb

## 2021-02-16 DIAGNOSIS — I5032 Chronic diastolic (congestive) heart failure: Secondary | ICD-10-CM | POA: Diagnosis not present

## 2021-02-16 DIAGNOSIS — Z87898 Personal history of other specified conditions: Secondary | ICD-10-CM | POA: Diagnosis not present

## 2021-02-16 DIAGNOSIS — H349 Unspecified retinal vascular occlusion: Secondary | ICD-10-CM | POA: Diagnosis not present

## 2021-02-16 DIAGNOSIS — R0602 Shortness of breath: Secondary | ICD-10-CM

## 2021-02-16 DIAGNOSIS — I495 Sick sinus syndrome: Secondary | ICD-10-CM | POA: Diagnosis not present

## 2021-02-16 DIAGNOSIS — I10 Essential (primary) hypertension: Secondary | ICD-10-CM

## 2021-02-16 NOTE — Patient Instructions (Signed)
Medication Instructions:  Your physician recommends that you continue on your current medications as directed. Please refer to the Current Medication list given to you today.  *If you need a refill on your cardiac medications before your next appointment, please call your pharmacy*   Lab Work: None today  If you have labs (blood work) drawn today and your tests are completely normal, you will receive your results only by: [place] (if you have MyChart) OR A paper copy in the mail If you have any lab test that is abnormal or we need to change your treatment, we will call you to review the results.   Testing/Procedures: None today    Follow-Up: At [facility], you and your health needs are our priority.  As part of our continuing mission to provide you with exceptional heart care, we have created designated Provider Care Teams.  These Care Teams include your primary Cardiologist (physician) and Advanced Practice Providers (APPs -  Physician Assistants and Nurse Practitioners) who all work together to provide you with the care you need, when you need it.  We recommend signing up for the patient portal called "MyChart".  Sign up information is provided on this After Visit Summary.  MyChart is used to connect with patients for Virtual Visits (Telemedicine).  Patients are able to view lab/test results, encounter notes, upcoming appointments, etc.  Non-urgent messages can be sent to your provider as well.   To learn more about what you can do with MyChart, go to [email or web address].    Your next appointment:   6 month(s)  The format for your next appointment:   In Person  Provider:   Rozann Lesches, MD   Other Instructions   Take blood pressure 2 hours after taking medications.    Follow 2 gram sodium diet  Two Gram Sodium Diet 2000 mg  What is Sodium? Sodium is a mineral found naturally in many foods. The most significant source of sodium in the diet is table  salt, which is about 40% sodium.  Processed, convenience, and preserved foods also contain a large amount of sodium.  The body needs only 500 mg of sodium daily to function,  A normal diet provides more than enough sodium even if you do not use salt.  Why Limit Sodium? A build up of sodium in the body can cause thirst, increased blood pressure, shortness of breath, and water retention.  Decreasing sodium in the diet can reduce edema and risk of heart attack or stroke associated with high blood pressure.  Keep in mind that there are many other factors involved in these health problems.  Heredity, obesity, lack of exercise, cigarette smoking, stress and what you eat all play a role.  General Guidelines: Do not add salt at the table or in cooking.  One teaspoon of salt contains over 2 grams of sodium. Read food labels Avoid processed and convenience foods Ask your dietitian before eating any foods not dicussed in the menu planning guidelines Consult your physician if you wish to use a salt substitute or a sodium containing medication such as antacids.  Limit milk and milk products to 16 oz (2 cups) per day.  Shopping Hints: READ LABELS!! "Dietetic" does not necessarily mean low sodium. Salt and other sodium ingredients are often added to foods during processing.    Menu Planning Guidelines Food Group Choose More Often Avoid  Beverages (see also the milk group All fruit juices, low-sodium, salt-free vegetables juices, low-sodium carbonated beverages Regular vegetable or  tomato juices, commercially softened water used for drinking or cooking  Breads and Cereals Enriched white, wheat, rye and pumpernickel bread, hard rolls and dinner rolls; muffins, cornbread and waffles; most dry cereals, cooked cereal without added salt; unsalted crackers and breadsticks; low sodium or homemade bread crumbs Bread, rolls and crackers with salted tops; quick breads; instant hot cereals; pancakes; commercial bread  stuffing; self-rising flower and biscuit mixes; regular bread crumbs or cracker crumbs  Desserts and Sweets Desserts and sweets mad with mild should be within allowance Instant pudding mixes and cake mixes  Fats Butter or margarine; vegetable oils; unsalted salad dressings, regular salad dressings limited to 1 Tbs; light, sour and heavy cream Regular salad dressings containing bacon fat, bacon bits, and salt pork; snack dips made with instant soup mixes or processed cheese; salted nuts  Fruits Most fresh, frozen and canned fruits Fruits processed with salt or sodium-containing ingredient (some dried fruits are processed with sodium sulfites        Vegetables Fresh, frozen vegetables and low- sodium canned vegetables Regular canned vegetables, sauerkraut, pickled vegetables, and others prepared in brine; frozen vegetables in sauces; vegetables seasoned with ham, bacon or salt pork  Condiments, Sauces, Miscellaneous  Salt substitute with physician's approval; pepper, herbs, spices; vinegar, lemon or lime juice; hot pepper sauce; garlic powder, onion powder, low sodium soy sauce (1 Tbs.); low sodium condiments (ketchup, chili sauce, mustard) in limited amounts (1 tsp.) fresh ground horseradish; unsalted tortilla chips, pretzels, potato chips, popcorn, salsa (1/4 cup) Any seasoning made with salt including garlic salt, celery salt, onion salt, and seasoned salt; sea salt, rock salt, kosher salt; meat tenderizers; monosodium glutamate; mustard, regular soy sauce, barbecue, sauce, chili sauce, teriyaki sauce, steak sauce, Worcestershire sauce, and most flavored vinegars; canned gravy and mixes; regular condiments; salted snack foods, olives, picles, relish, horseradish sauce, catsup   Food preparation: Try these seasonings Meats:    Pork Sage, onion Serve with applesauce  Chicken Poultry seasoning, thyme, parsley Serve with cranberry sauce  Lamb Curry powder, rosemary, garlic, thyme Serve with mint sauce  or jelly  Veal Marjoram, basil Serve with current jelly, cranberry sauce  Beef Pepper, bay leaf Serve with dry mustard, unsalted chive butter  Fish [place] leaf, dill Serve with unsalted lemon butter, unsalted parsley butter  Vegetables:    Asparagus Lemon juice   Broccoli Lemon juice   Carrots Mustard dressing parsley, mint, nutmeg, glazed with unsalted butter and sugar   Green beans Marjoram, lemon juice, nutmeg,dill seed   Tomatoes Basil, marjoram, onion   Spice /blend for [employer]" 4 tsp ground thyme 1 tsp ground sage 3 tsp ground rosemary 4 tsp ground marjoram   Test your knowledge A product that says "Salt Free" may still contain sodium. True or False Garlic Powder and Hot Pepper Sauce an be used as alternative seasonings.True or False Processed foods have more sodium than fresh foods.  True or False Canned Vegetables have less sodium than froze True or False   WAYS TO DECREASE YOUR SODIUM INTAKE Avoid the use of added salt in cooking and at the table.  Table salt (and other prepared seasonings which contain salt) is probably one of the greatest sources of sodium in the diet.  Unsalted foods can gain flavor from the sweet, sour, and butter taste sensations of herbs and spices.  Instead of using salt for seasoning, try the following seasonings with the foods listed.  Remember: how you use them to enhance natural food flavors is limited  only by your creativity... Allspice-Meat, fish, eggs, fruit, peas, red and yellow vegetables Almond Extract-Fruit baked goods Anise Seed-Sweet breads, fruit, carrots, beets, cottage cheese, cookies (tastes like licorice) Basil-Meat, fish, eggs, vegetables, rice, vegetables salads, soups, sauces Bay Leaf-Meat, fish, stews, poultry Burnet-Salad, vegetables (cucumber-like flavor) Caraway Seed-Bread, cookies, cottage cheese, meat, vegetables, cheese, rice Cardamon-Baked goods, fruit, soups Celery Powder or seed-Salads, salad dressings, sauces, meatloaf,  soup, bread.Do not use  celery salt Chervil-Meats, salads, fish, eggs, vegetables, cottage cheese (parsley-like flavor) Chili Power-Meatloaf, chicken cheese, corn, eggplant, egg dishes Chives-Salads cottage cheese, egg dishes, soups, vegetables, sauces Cilantro-Salsa, casseroles Cinnamon-Baked goods, fruit, pork, lamb, chicken, carrots Cloves-Fruit, baked goods, fish, pot roast, green beans, beets, carrots Coriander-Pastry, cookies, meat, salads, cheese (lemon-orange flavor) Cumin-Meatloaf, fish,cheese, eggs, cabbage,fruit pie (caraway flavor) [employer], fruit, eggs, fish, poultry, cottage cheese, vegetables Dill Seed-Meat, cottage cheese, poultry, vegetables, fish, salads, bread Fennel Seed-Bread, cookies, apples, pork, eggs, fish, beets, cabbage, cheese, Licorice-like flavor Garlic-(buds or powder) Salads, meat, poultry, fish, bread, butter, vegetables, potatoes.Do not  use garlic salt Ginger-Fruit, vegetables, baked goods, meat, fish, poultry Horseradish Root-Meet, vegetables, butter Lemon Juice or Extract-Vegetables, fruit, tea, baked goods, fish salads Mace-Baked goods fruit, vegetables, fish, poultry (taste like nutmeg) Maple Extract-Syrups Marjoram-Meat, chicken, fish, vegetables, breads, green salads (taste like Sage) Mint-Tea, lamb, sherbet, vegetables, desserts, carrots, cabbage Mustard, Dry or Seed-Cheese, eggs, meats, vegetables, poultry Nutmeg-Baked goods, fruit, chicken, eggs, vegetables, desserts Onion Powder-Meat, fish, poultry, vegetables, cheese, eggs, bread, rice salads (Do not use   Onion salt) Orange Extract-Desserts, baked goods Oregano-Pasta, eggs, cheese, onions, pork, lamb, fish, chicken, vegetables, green salads Paprika-Meat, fish, poultry, eggs, cheese, vegetables Parsley Flakes-Butter, vegetables, meat fish, poultry, eggs, bread, salads (certain forms may   Contain sodium Pepper-Meat fish, poultry, vegetables, eggs Peppermint Extract-Desserts, baked  goods Poppy Seed-Eggs, bread, cheese, fruit dressings, baked goods, noodles, vegetables, cottage  [employer], poultry, meat, fish, cauliflower, turnips,eggs bread Saffron-Rice, bread, veal, chicken, fish, eggs Sage-Meat, fish, poultry, onions, eggplant, tomateos, pork, stews Savory-Eggs, salads, poultry, meat, rice, vegetables, soups, pork Tarragon-Meat, poultry, fish, eggs, butter, vegetables (licorice-like flavor)  Thyme-Meat, poultry, fish, eggs, vegetables, (clover-like flavor), sauces, soups Tumeric-Salads, butter, eggs, fish, rice, vegetables (saffron-like flavor) Vanilla Extract-Baked goods, candy Vinegar-Salads, vegetables, meat marinades Walnut Extract-baked goods, candy   2. Choose your Foods Wisely   The following is a list of foods to avoid which are high in sodium:  Meats-Avoid all smoked, canned, salt cured, dried and kosher meat and fish as well as Anchovies   [employer] meats:Bologna, Liverwurst, Pastrami Canned meat or fish  Marinated herring Caviar    Pepperoni Corned Beef   Pizza Dried chipped beef  Salami Frozen breaded fish or meat Salt pork Frankfurters or hot dogs  Sardines Gefilte fish   Sausage Ham (boiled ham, Proscuitto Smoked butt    spiced ham)   Spam      TV Dinners Vegetables Canned vegetables (Regular) Relish Canned mushrooms  Sauerkraut Olives    Tomato juice Pickles  Bakery and Dessert Products Canned puddings  Cream pies Cheesecake   Decorated cakes Cookies  Beverages/Juices Tomato juice, regular  Gatorade   V-8 vegetable juice, regular  Breads and Cereals Biscuit mixes   Salted potato chips, corn chips, pretzels Bread stuffing mixes  Salted crackers and rolls Pancake and waffle mixes Self-rising flour  Seasonings Accent    Meat sauces Barbecue sauce  Meat tenderizer Catsup    Monosodium glutamate (MSG) Celery salt   Onion  salt Chili sauce   Prepared mustard Garlic  salt   Salt, seasoned salt, sea salt Gravy mixes   Soy sauce Horseradish   Steak sauce Ketchup   Tartar sauce Lite salt    Teriyaki sauce Marinade mixes   Worcestershire sauce  Others Baking powder   Cocoa and cocoa mixes Baking soda   Commercial casserole mixes Candy-caramels, chocolate  Dehydrated soups    Bars, fudge,nougats  Instant rice and pasta mixes Canned broth or soup  Maraschino cherries Cheese, aged and processed cheese and cheese spreads  Learning Assessment Quiz  Indicated T (for True) or F (for False) for each of the following statements:  _____ Fresh fruits and vegetables and unprocessed grains are generally low in sodium _____ Water may contain a considerable amount of sodium, depending on the source _____ You can always tell if a food is high in sodium by tasting it _____ Certain laxatives my be high in sodium and should be avoided unless prescribed   by a physician or pharmacist _____ Salt substitutes may be used freely by anyone on a sodium restricted diet _____ Sodium is present in table salt, food additives and as a natural component of   most foods _____ Table salt is approximately 90% sodium _____ Limiting sodium intake may help prevent excess fluid accumulation in the body _____ On a sodium-restricted diet, seasonings such as bouillon soy sauce, and    cooking wine should be used in place of table salt _____ On an ingredient list, a product which lists monosodium glutamate as the first   ingredient is an appropriate food to include on a low sodium diet  Circle the best answer(s) to the following statements (Hint: there may be more than one correct answer)  11. On a low-sodium diet, some acceptable snack items are:    A. Olives  F. Bean dip   K. Grapefruit juice    B. Salted Pretzels G. Commercial Popcorn   L. Canned peaches    C. Carrot Sticks  H. Bouillon   M. Unsalted nuts   D. [place] fries  I. Peanut butter crackers N. Salami   E. Sweet pickles J.  Tomato Juice   O. Pizza  12.  Seasonings that may be used freely on a reduced - sodium diet include   A. Lemon wedges F.Monosodium glutamate K. Celery seed    B.Soysauce   G. Pepper   L. Mustard powder   C. Sea salt  H. Cooking wine  M. Onion flakes   D. Vinegar  E. Prepared horseradish N. Salsa   E. Sage   J. Worcestershire sauce  O. Chutney

## 2021-02-27 DIAGNOSIS — F4323 Adjustment disorder with mixed anxiety and depressed mood: Secondary | ICD-10-CM | POA: Diagnosis not present

## 2021-03-04 ENCOUNTER — Telehealth: Payer: Self-pay | Admitting: *Deleted

## 2021-03-04 NOTE — Chronic Care Management (AMB) (Signed)
  Chronic Care Management   Outreach Note  03/04/2021 Name: Zylee Malicoat MRN: [id number] DOB: 06/26/1951  Yehudit Boysel is a 70 y.o. year old female who is a primary care patient of Luking, Elayne Snare, MD. I reached out to Karie Schwalbe by phone today in response to a referral sent by Ms. Ansel Bong PCP, Kathyrn Drown, MD.      An unsuccessful telephone outreach was attempted today. The patient was referred to the case management team for assistance with care management and care coordination.   Follow Up Plan: A HIPAA compliant phone message was left for the patient providing contact information and requesting a return call. The care management team will reach out to the patient again over the next 7 days.  If patient returns call to provider office, please advise to call [place] at [phone].  [place] [facility]  Direct Dial: [phone]

## 2021-03-04 NOTE — Chronic Care Management (AMB) (Signed)
  Chronic Care Management   Note  03/04/2021 Name: Nubia Ziesmer MRN: [id number] DOB: May 15, 1951  Leilany Digeronimo is a 70 y.o. year old female who is a primary care patient of Luking, Elayne Snare, MD. I reached out to Karie Schwalbe by phone today in response to a referral sent by Ms. Ansel Bong PCP, Kathyrn Drown, MD.      Ms. Mccurdy was given information about Chronic Care Management services today including:  CCM service includes personalized support from designated clinical staff supervised by her physician, including individualized plan of care and coordination with other care providers 24/7 contact phone numbers for assistance for urgent and routine care needs. Service will only be billed when office clinical staff spend 20 minutes or more in a month to coordinate care. Only one practitioner may furnish and bill the service in a calendar month. The patient may stop CCM services at any time (effective at the end of the month) by phone call to the office staff. The patient will be responsible for cost sharing (co-pay) of up to 20% of the service fee (after annual deductible is met).  Patient agreed to services and verbal consent obtained.   Follow up plan: Telephone appointment with care management team member scheduled for: [facility] 03/18/21  [place] [facility]  Direct Dial: [phone]

## 2021-03-09 ENCOUNTER — Other Ambulatory Visit: Payer: Self-pay

## 2021-03-09 ENCOUNTER — Ambulatory Visit (INDEPENDENT_AMBULATORY_CARE_PROVIDER_SITE_OTHER): Payer: PPO | Admitting: Family Medicine

## 2021-03-09 ENCOUNTER — Encounter: Payer: Self-pay | Admitting: Family Medicine

## 2021-03-09 VITALS — BP 132/78 | Temp 97.3°F | Wt 281.2 lb

## 2021-03-09 DIAGNOSIS — I5189 Other ill-defined heart diseases: Secondary | ICD-10-CM | POA: Diagnosis not present

## 2021-03-09 DIAGNOSIS — I10 Essential (primary) hypertension: Secondary | ICD-10-CM | POA: Diagnosis not present

## 2021-03-09 DIAGNOSIS — R7303 Prediabetes: Secondary | ICD-10-CM | POA: Diagnosis not present

## 2021-03-09 NOTE — Progress Notes (Signed)
   Subjective:    Patient ID: [name], female    DOB: 11-04-1950, 70 y.o.   MRN: [id number]  HPI Pt here to have DMV forms completed. Last page is for [place].   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 Diastolic dysfunction stable Bipolar disease stable with psychiatry Prediabetes stable Hypertension stable No need for any lab work Forms were filled out May continue to drive Follow-up every 6 months to 12 months here for regular medical issues otherwise follow through with specialist Yearly form recommended

## 2021-03-14 DIAGNOSIS — G4733 Obstructive sleep apnea (adult) (pediatric): Secondary | ICD-10-CM | POA: Diagnosis not present

## 2021-03-18 ENCOUNTER — Ambulatory Visit (INDEPENDENT_AMBULATORY_CARE_PROVIDER_SITE_OTHER): Payer: PPO | Admitting: *Deleted

## 2021-03-18 DIAGNOSIS — I10 Essential (primary) hypertension: Secondary | ICD-10-CM

## 2021-03-18 DIAGNOSIS — I5189 Other ill-defined heart diseases: Secondary | ICD-10-CM

## 2021-03-18 NOTE — Patient Instructions (Addendum)
Visit Information   PATIENT GOALS:   Goals Addressed             This Visit's Progress    Track and Manage My Blood Pressure-Hypertension       Timeframe:  Long-Range Goal Priority:  Medium Start Date:         03/18/2021                    Expected End Date:          09/15/2021             Follow Up Date  05/13/2021   - check blood pressure daily - write blood pressure results in a log or diary  - follow low sodium diet - look over education sent via My Chart- low sodium diet - continue exercising at the [facility]- keep up the good work   Why is this important?   You won't feel high blood pressure, but it can still hurt your blood vessels.  High blood pressure can cause heart or kidney problems. It can also cause a stroke.  Making lifestyle changes like losing a little weight or eating less salt will help.  Checking your blood pressure at home and at different times of the day can help to control blood pressure.  If the doctor prescribes medicine remember to take it the way the doctor ordered.  Call the office if you cannot afford the medicine or if there are questions about it.     Notes:      Track and Manage Symptoms-Heart Failure       Timeframe:  Long-Range Goal Priority:  Medium Start Date:            03/18/2021                 Expected End Date:     09/15/2021                  Follow Up Date 05/13/2021   - develop a rescue plan - eat more whole grains, fruits and vegetables, lean meats and healthy fats - follow rescue plan if symptoms flare-up - know when to call the doctor - track symptoms and what helps feel better or worse  - call your doctor if you gain 3 pounds overnight or 5 pounds in one week - take all medications as prescribed - look over education sent via My Chart- heart failure action plan   Why is this important?   You will be able to handle your symptoms better if you keep track of them.  Making some simple changes to your lifestyle will help.  Eating  healthy is one thing you can do to take good care of yourself.    Notes:         Consent to CCM Services: [name] was given information about Chronic Care Management services including:  CCM service includes personalized support from designated clinical staff supervised by her physician, including individualized plan of care and coordination with other care providers 24/7 contact phone numbers for assistance for urgent and routine care needs. Service will only be billed when office clinical staff spend 20 minutes or more in a month to coordinate care. Only one practitioner may furnish and bill the service in a calendar month. The patient may stop CCM services at any time (effective at the end of the month) by phone call to the office staff. The patient will be responsible for cost sharing (  co-pay) of up to 20% of the service fee (after annual deductible is met).  Patient agreed to services and verbal consent obtained.   Patient verbalizes understanding of instructions provided today and agrees to view in [place].   Telephone follow up appointment with care management team member scheduled for:  05/13/2021  Heart Failure Action Plan A heart failure action plan helps you understand what to do when you have symptoms of heart failure. Your action plan is a color-coded plan that lists the symptoms to watch for and indicates what actions to take. If you have symptoms in the red zone, you need medical care right away. If you have symptoms in the yellow zone, you are having problems. If you have symptoms in the green zone, you are doing well. Follow the plan that was created by you and your health care provider. Review your plan each time you visit your health care provider. Red zone These signs and symptoms mean you should get medical help right away: You have trouble breathing when resting. You have a dry cough that is getting worse. You have swelling or pain in your legs or abdomen  that is getting worse. You suddenly gain more than 2-3 lb (0.9-1.4 kg) in 24 hours, or more than 5 lb (2.3 kg) in a week. This amount may be more or less depending on your condition. You have trouble staying awake or you feel confused. You have chest pain. You do not have an appetite. You pass out. You have worsening sadness or depression. If you have any of these symptoms, call your local emergency services (911 in the [place] right away. Do not drive yourself to the hospital. Yellow zone These signs and symptoms mean your condition may be getting worse and you should make some changes: You have trouble breathing when you are active, or you need to sleep with your head raised on extra pillows to help you breathe. You have swelling in your legs or abdomen. You gain 2-3 lb (0.9-1.4 kg) in 24 hours, or 5 lb (2.3 kg) in a week. This amount may be more or less depending on your condition. You get tired easily. You have trouble sleeping. You have a dry cough. If you have any of these symptoms: Contact your health care provider within the next day. Your health care provider may adjust your medicines. Green zone These signs mean you are doing well and can continue what you are doing: You do not have shortness of breath. You have very little swelling or no new swelling. Your weight is stable (no gain or loss). You have a normal activity level. You do not have chest pain or any other new symptoms. Follow these instructions at home: Take over-the-counter and prescription medicines only as told by your health care provider. Weigh yourself daily. Your target weight is __________ lb (__________ kg). Call your health care provider if you gain more than __________ lb (__________ kg) in 24 hours, or more than __________ lb (__________ kg) in a week. Health care provider name: _____________________________________________________ Health care provider phone number:  _____________________________________________________ Eat a heart-healthy diet. Work with a diet and nutrition specialist (dietitian) to create an eating plan that is best for you. Keep all follow-up visits. This is important. Where to find more information American Heart Association: www.heart.org Summary A heart failure action plan helps you understand what to do when you have symptoms of heart failure. Follow the action plan that was created by you and your health care provider.  Get help right away if you have any symptoms in the red zone. This information is not intended to replace advice given to you by your health care provider. Make sure you discuss any questions you have with your health care provider. Document Revised: 02/11/2020 Document Reviewed: 02/11/2020 Elsevier Patient Education  2022 [place]. Low-Sodium Eating Plan Sodium, which is an element that makes up salt, helps you maintain a healthy balance of fluids in your body. Too much sodium can increase your blood pressure and cause fluid and waste to be held in your body. Your health care provider or dietitian may recommend following this plan if you have high blood pressure (hypertension), kidney disease, liver disease, or heart failure. Eating less sodium can help lower your blood pressure, reduce swelling, and protect your heart, liver, and kidneys. What are tips for following this plan? Reading food labels The Nutrition Facts label lists the amount of sodium in one serving of the food. If you eat more than one serving, you must multiply the listed amount of sodium by the number of servings. Choose foods with less than 140 mg of sodium per serving. Avoid foods with 300 mg of sodium or more per serving. Shopping  Look for lower-sodium products, often labeled as "low-sodium" or "no salt added." Always check the sodium content, even if foods are labeled as "unsalted" or "no salt added." Buy fresh foods. Avoid canned  foods and pre-made or frozen meals. Avoid canned, cured, or processed meats. Buy breads that have less than 80 mg of sodium per slice. Cooking  Eat more home-cooked food and less restaurant, buffet, and fast food. Avoid adding salt when cooking. Use salt-free seasonings or herbs instead of table salt or sea salt. Check with your health care provider or pharmacist before using salt substitutes. Cook with plant-based oils, such as canola, sunflower, or olive oil. Meal planning When eating at a restaurant, ask that your food be prepared with less salt or no salt, if possible. Avoid dishes labeled as brined, pickled, cured, smoked, or made with soy sauce, miso, or teriyaki sauce. Avoid foods that contain MSG (monosodium glutamate). MSG is sometimes added to [place] food, bouillon, and some canned foods. Make meals that can be grilled, baked, poached, roasted, or steamed. These are generally made with less sodium. General information Most people on this plan should limit their sodium intake to 1,500-2,000 mg (milligrams) of sodium each day. What foods should I eat? Fruits Fresh, frozen, or canned fruit. Fruit juice. Vegetables Fresh or frozen vegetables. "No salt added" canned vegetables. "No salt added" tomato sauce and paste. Low-sodium or reduced-sodium tomato and vegetable juice. Grains Low-sodium cereals, including oats, puffed wheat and rice, and shredded wheat. Low-sodium crackers. Unsalted rice. Unsalted pasta. Low-sodium bread. Whole-grain breads and whole-grain pasta. Meats and other proteins Fresh or frozen (no salt added) meat, poultry, seafood, and fish. Low-sodium canned tuna and salmon. Unsalted nuts. Dried peas, beans, and lentils without added salt. Unsalted canned beans. Eggs. Unsalted nut butters. Dairy Milk. Soy milk. Cheese that is naturally low in sodium, such as ricotta cheese, fresh mozzarella, or Swiss cheese. Low-sodium or reduced-sodium cheese. Cream cheese.  Yogurt. Seasonings and condiments Fresh and dried herbs and spices. Salt-free seasonings. Low-sodium mustard and ketchup. Sodium-free salad dressing. Sodium-free light mayonnaise. Fresh or refrigerated horseradish. Lemon juice. Vinegar. Other foods Homemade, reduced-sodium, or low-sodium soups. Unsalted popcorn and pretzels. Low-salt or salt-free chips. The items listed above may not be a complete list of foods and beverages you  can eat. Contact a dietitian for more information. What foods should I avoid? Vegetables Sauerkraut, pickled vegetables, and relishes. Olives. [place] fries. Onion rings. Regular canned vegetables (not low-sodium or reduced-sodium). Regular canned tomato sauce and paste (not low-sodium or reduced-sodium). Regular tomato and vegetable juice (not low-sodium or reduced-sodium). Frozen vegetables in sauces. Grains Instant hot cereals. Bread stuffing, pancake, and biscuit mixes. Croutons. Seasoned rice or pasta mixes. Noodle soup cups. Boxed or frozen macaroni and cheese. Regular salted crackers. Self-rising flour. Meats and other proteins Meat or fish that is salted, canned, smoked, spiced, or pickled. Precooked or cured meat, such as sausages or meat loaves. Berniece Salines. Ham. Pepperoni. Hot dogs. Corned beef. Chipped beef. Salt pork. Jerky. Pickled herring. Anchovies and sardines. Regular canned tuna. Salted nuts. Dairy Processed cheese and cheese spreads. Hard cheeses. Cheese curds. Blue cheese. Feta cheese. String cheese. Regular cottage cheese. Buttermilk. Canned milk. Fats and oils Salted butter. Regular margarine. Ghee. Bacon fat. Seasonings and condiments Onion salt, garlic salt, seasoned salt, table salt, and sea salt. Canned and packaged gravies. Worcestershire sauce. Tartar sauce. Barbecue sauce. Teriyaki sauce. Soy sauce, including reduced-sodium. Steak sauce. Fish sauce. Oyster sauce. Cocktail sauce. Horseradish that you find on the shelf. Regular ketchup and mustard. Meat  flavorings and tenderizers. Bouillon cubes. Hot sauce. Pre-made or packaged marinades. Pre-made or packaged taco seasonings. Relishes. Regular salad dressings. Salsa. Other foods Salted popcorn and pretzels. Corn chips and puffs. Potato and tortilla chips. Canned or dried soups. Pizza. Frozen entrees and pot pies. The items listed above may not be a complete list of foods and beverages you should avoid. Contact a dietitian for more information. Summary Eating less sodium can help lower your blood pressure, reduce swelling, and protect your heart, liver, and kidneys. Most people on this plan should limit their sodium intake to 1,500-2,000 mg (milligrams) of sodium each day. Canned, boxed, and frozen foods are high in sodium. Restaurant foods, fast foods, and pizza are also very high in sodium. You also get sodium by adding salt to food. Try to cook at home, eat more fresh fruits and vegetables, and eat less fast food and canned, processed, or prepared foods. This information is not intended to replace advice given to you by your health care provider. Make sure you discuss any questions you have with your health care provider. Document Revised: 08/03/2019 Document Reviewed: 05/30/2019 Elsevier Patient Education  2022 [place], BSN RN Case Manager [facility] [phone]   CLINICAL CARE PLAN: Patient Care Plan: Heart Failure (Adult)     Problem Identified: Symptom Exacerbation (Heart Failure)   Priority: Medium     Long-Range Goal: Symptom Exacerbation Prevented or Minimized   Start Date: 03/18/2021  Expected End Date: 09/15/2021  This Visit's Progress: On track  Priority: Medium  Note:   Current Barriers:  Knowledge deficit related to basic heart failure pathophysiology and self care management- patient can benefit from reinforcement of heart failure action plan, diet teaching, pt weighs daily with weight today 277 pounds. Pt reports niece lives  with her. Pt reports she is independent in all aspects of her care, still drives, reports has all medications and taking as prescribed.  Pt states she attends [employer] 4 days per week and uses elliptical, is active and leaves home, attends church regularly. Case Manager Clinical Goal(s):  patient will verbalize understanding of Heart Failure Action Plan and when to call doctor patient will take all Heart Failure mediations as prescribed patient will weigh  daily and record (notifying MD of 3 lb weight gain over night or 5 lb in a week) Interventions:  Collaboration with Kathyrn Drown, MD regarding development and update of comprehensive plan of care as evidenced by provider attestation and co-signature Inter-disciplinary care team collaboration (see longitudinal plan of care) Basic overview and discussion of pathophysiology of Heart Failure reviewed  Provided verbal education on low sodium diet Provided written education on low sodium diet Reviewed Heart Failure Action Plan in depth and provided written copy Assessed need for readable accurate scales in home Provided education about placing scale on hard, flat surface Advised patient to weigh each morning after emptying bladder Discussed importance of daily weight and advised patient to weigh and record daily Reviewed role of diuretics in prevention of fluid overload and management of heart failure Reviewed medications and importance of taking as prescribed Self-Care Activities: Calls provider office for new concerns or questions Patient Goals: - develop a rescue plan - eat more whole grains, fruits and vegetables, lean meats and healthy fats - follow rescue plan if symptoms flare-up - know when to call the doctor - track symptoms and what helps feel better or worse  - call your doctor if you gain 3 pounds overnight or 5 pounds in one week - take all medications as prescribed - look over education sent via My Chart- heart failure action  -  follow activity or exercise plan - warm up and cool down for 10 minutes before and after exercise - track weight in diary - use salt in moderation - watch for swelling in feet, ankles and legs every day - weigh myself daily Follow Up Plan: Telephone follow up appointment with care management team member scheduled for:   05/13/2021    Patient Care Plan: Hypertension (Adult)     Problem Identified: Hypertension (Hypertension)   Priority: Medium     Long-Range Goal: Hypertension Monitored   This Visit's Progress: On track  Priority: Medium  Note:   Objective:  Last practice recorded BP readings:  BP Readings from Last 3 Encounters:  03/09/21 132/78  02/16/21 130/80  02/05/21 96/62   Most recent eGFR/CrCl:  Lab Results  Component Value Date   EGFR 70 12/03/2020    No components found for: CRCL Current Barriers:  Knowledge Deficits related to basic understanding of hypertension pathophysiology and self care management- patient does not always adhere to low sodium diet although she is trying to do better with her diet and is exercising 4 days weekly.  Pt checks blood pressure daily with recent reading 120/70 Case Manager Clinical Goal(s):  patient will verbalize understanding of plan for hypertension management patient will attend all scheduled medical appointments: 9/16 mammogram, 10/11 Dr. Zadie Rhine,  08/07/21 primary care provider patient will demonstrate improved adherence to prescribed treatment plan for hypertension as evidenced by taking all medications as prescribed, monitoring and recording blood pressure as directed, adhering to low sodium/DASH diet Interventions:  Collaboration with Kathyrn Drown, MD regarding development and update of comprehensive plan of care as evidenced by provider attestation and co-signature Inter-disciplinary care team collaboration (see longitudinal plan of care) Evaluation of current treatment plan related to hypertension self management and  patient's adherence to plan as established by provider. Reviewed medications with patient and discussed importance of compliance Discussed plans with patient for ongoing care management follow up and provided patient with direct contact information for care management team Advised patient, providing education and rationale, to monitor blood pressure daily and record, calling PCP  for findings outside established parameters.  Reviewed scheduled/upcoming provider appointments  Reviewed low sodium diet and sent education via My Chart Encouraged pt to continue exercise program at [place]:  Attends all scheduled provider appointments Calls provider office for new concerns, questions, or BP outside discussed parameters Checks BP and records as discussed Follows a low sodium diet/DASH diet Patient Goals: - check blood pressure daily - write blood pressure results in a log or diary  - follow low sodium diet - look over education sent via My Chart- low sodium diet - continue exercising at the [facility]- keep up the good work Follow Up Plan: Telephone follow up appointment with care management team member scheduled for:  05/13/2021

## 2021-03-18 NOTE — Chronic Care Management (AMB) (Signed)
Chronic Care Management   CCM RN Visit Note  03/18/2021 Name: [name] MRN: [id number] DOB: [birth year]  Subjective: [name] is a 70 y.o. year old female who is a primary care patient of Luking, Elayne Snare, MD. The care management team was consulted for assistance with disease management and care coordination needs.    Engaged with patient by telephone for initial visit in response to provider referral for case management and/or care coordination services.   Consent to Services:  The patient was given the following information about Chronic Care Management services today, agreed to services, and gave verbal consent: 1. CCM service includes personalized support from designated clinical staff supervised by the primary care provider, including individualized plan of care and coordination with other care providers 2. 24/7 contact phone numbers for assistance for urgent and routine care needs. 3. Service will only be billed when office clinical staff spend 20 minutes or more in a month to coordinate care. 4. Only one practitioner may furnish and bill the service in a calendar month. 5.The patient may stop CCM services at any time (effective at the end of the month) by phone call to the office staff. 6. The patient will be responsible for cost sharing (co-pay) of up to 20% of the service fee (after annual deductible is met). Patient agreed to services and consent obtained.  Patient agreed to services and verbal consent obtained.   Assessment: Review of patient past medical history, allergies, medications, health status, including review of consultants reports, laboratory and other test data, was performed as part of comprehensive evaluation and provision of chronic care management services.   SDOH (Social Determinants of Health) assessments and interventions performed:    CCM Care Plan  Allergies  Allergen Reactions   Imipramine Hives and Rash   Tricor [Fenofibrate] Other (See Comments)     Pain, "aching"    Outpatient Encounter Medications as of 03/18/2021  Medication Sig Note   acetaminophen (TYLENOL) 650 MG CR tablet Take 650-1,300 mg by mouth See admin instructions. 1300MG  IN THE MORNING AND 650MG  AT BEDTIME 12/25/2018: 2 Tylenol XR BID   ALPRAZolam (XANAX XR) 1 MG 24 hr tablet Take 1 mg by mouth in the morning and at bedtime.    ALPRAZolam (XANAX) 1 MG tablet Take 0.5 mg by mouth daily as needed for anxiety.     aspirin 325 MG EC tablet Take 325 mg by mouth daily.    glucosamine-chondroitin 500-400 MG tablet Take 2 tablets by mouth every morning.     lamoTRIgine (LAMICTAL) 200 MG tablet Take 400 mg by mouth at bedtime.    Multiple Vitamin (MULTIVITAMIN) tablet Take 1 tablet by mouth daily.    QUEtiapine (SEROQUEL) 200 MG tablet Take 600 mg by mouth at bedtime.    telmisartan (MICARDIS) 20 MG tablet Take 0.5 tablets (10 mg total) by mouth daily. 02/05/2021: 1/2 tab daily   Vitamin D, Ergocalciferol, (DRISDOL) 50000 units CAPS capsule Take 50,000 Units by mouth every Sunday.     rosuvastatin (CRESTOR) 10 MG tablet Take 1 tablet (10 mg total) by mouth daily.    No facility-administered encounter medications on file as of 03/18/2021.    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   Cotton wool spots 12/01/2020   Obstructive sleep apnea of adult 08/26/2020   Cystoid macular edema, right eye 08/12/2020   Intermediate stage nonexudative age-related macular degeneration of both eyes 08/12/2020   Vitreomacular adhesion  of right eye 08/12/2020   Serous retinal detachment of right eye 08/12/2020   Type 2 macular telangiectasis, 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   Essential hypertension 03/06/2020   Prediabetes 02/10/2020   Cardiac pacemaker in situ 01/07/2020   Bipolar 2 disorder, major depressive episode ([name]) 01/07/2020   At risk for central sleep apnea 01/07/2020   Obesity with alveolar hypoventilation and body mass index  (BMI) of 40 or greater ([name]) 01/07/2020   Precordial chest pain 12/31/2019   DOE (dyspnea on exertion) 12/05/2019   Chronic diastolic CHF (congestive heart failure) ([name]) 12/05/2019   Memory loss 12/12/2017   Morbid obesity ([name]) 09/05/2017   Sinus node dysfunction ([name]) 01/06/2016   Bradycardia 10/22/2015   Bipolar disorder ([name]) 10/22/2015   Anxiety 10/22/2015   Pre-syncope 10/22/2015   Episodic lightheadedness    GERD (gastroesophageal reflux disease) 01/08/2014   Orthostatic hypotension - resolved 11/28/2013   Palpitations 11/28/2013   Osteoarthritis 11/08/2013   Lumbar back pain 11/08/2013   Hypertriglyceridemia 08/06/2013   Depression 04/20/2013   History of left breast cancer (2013) 07/22/2011    Conditions to be addressed/monitored:CHF and HTN  Care Plan : Heart Failure (Adult)  Updates made by Kassie Mends, RN since 03/18/2021 12:00 AM     Problem: Symptom Exacerbation (Heart Failure)   Priority: Medium     Long-Range Goal: Symptom Exacerbation Prevented or Minimized   Start Date: 03/18/2021  Expected End Date: 09/15/2021  This Visit's Progress: On track  Priority: Medium  Note:   Current Barriers:  Knowledge deficit related to basic heart failure pathophysiology and self care management- patient can benefit from reinforcement of heart failure action plan, diet teaching, pt weighs daily with weight today 277 pounds. Pt reports niece lives with her. Pt reports she is independent in all aspects of her care, still drives, reports has all medications and taking as prescribed.  Pt states she attends [employer] 4 days per week and uses elliptical, is active and leaves home, attends church regularly. Case Manager Clinical Goal(s):  patient will verbalize understanding of Heart Failure Action Plan and when to call doctor patient will take all Heart Failure mediations as prescribed patient will weigh daily and record (notifying MD of 3 lb weight gain over night or 5 lb in a  week) Interventions:  Collaboration with Kathyrn Drown, MD regarding development and update of comprehensive plan of care as evidenced by provider attestation and co-signature Inter-disciplinary care team collaboration (see longitudinal plan of care) Basic overview and discussion of pathophysiology of Heart Failure reviewed  Provided verbal education on low sodium diet Provided written education on low sodium diet Reviewed Heart Failure Action Plan in depth and provided written copy Assessed need for readable accurate scales in home Provided education about placing scale on hard, flat surface Advised patient to weigh each morning after emptying bladder Discussed importance of daily weight and advised patient to weigh and record daily Reviewed role of diuretics in prevention of fluid overload and management of heart failure Reviewed medications and importance of taking as prescribed Self-Care Activities: Calls provider office for new concerns or questions Patient Goals: - develop a rescue plan - eat more whole grains, fruits and vegetables, lean meats and healthy fats - follow rescue plan if symptoms flare-up - know when to call the doctor - track symptoms and what helps feel better or worse  - call your doctor if you gain 3 pounds overnight or 5 pounds in one week - take all medications as prescribed - look  over education sent via My Chart- heart failure action  - follow activity or exercise plan - warm up and cool down for 10 minutes before and after exercise - track weight in diary - use salt in moderation - watch for swelling in feet, ankles and legs every day - weigh myself daily Follow Up Plan: Telephone follow up appointment with care management team member scheduled for:   05/13/2021    Care Plan : Hypertension (Adult)  Updates made by Kassie Mends, RN since 03/18/2021 12:00 AM     Problem: Hypertension (Hypertension)   Priority: Medium     Long-Range Goal:  Hypertension Monitored   This Visit's Progress: On track  Priority: Medium  Note:   Objective:  Last practice recorded BP readings:  BP Readings from Last 3 Encounters:  03/09/21 132/78  02/16/21 130/80  02/05/21 96/62   Most recent eGFR/CrCl:  Lab Results  Component Value Date   EGFR 70 12/03/2020    No components found for: CRCL Current Barriers:  Knowledge Deficits related to basic understanding of hypertension pathophysiology and self care management- patient does not always adhere to low sodium diet although she is trying to do better with her diet and is exercising 4 days weekly.  Pt checks blood pressure daily with recent reading 120/70 Case Manager Clinical Goal(s):  patient will verbalize understanding of plan for hypertension management patient will attend all scheduled medical appointments: 9/16 mammogram, 10/11 Dr. Zadie Rhine,  08/07/21 primary care provider patient will demonstrate improved adherence to prescribed treatment plan for hypertension as evidenced by taking all medications as prescribed, monitoring and recording blood pressure as directed, adhering to low sodium/DASH diet Interventions:  Collaboration with Kathyrn Drown, MD regarding development and update of comprehensive plan of care as evidenced by provider attestation and co-signature Inter-disciplinary care team collaboration (see longitudinal plan of care) Evaluation of current treatment plan related to hypertension self management and patient's adherence to plan as established by provider. Reviewed medications with patient and discussed importance of compliance Discussed plans with patient for ongoing care management follow up and provided patient with direct contact information for care management team Advised patient, providing education and rationale, to monitor blood pressure daily and record, calling PCP for findings outside established parameters.  Reviewed scheduled/upcoming provider appointments   Reviewed low sodium diet and sent education via My Chart Encouraged pt to continue exercise program at [place]:  Attends all scheduled provider appointments Calls provider office for new concerns, questions, or BP outside discussed parameters Checks BP and records as discussed Follows a low sodium diet/DASH diet Patient Goals: - check blood pressure daily - write blood pressure results in a log or diary  - follow low sodium diet - look over education sent via My Chart- low sodium diet - continue exercising at the [facility]- keep up the good work Follow Up Plan: Telephone follow up appointment with care management team member scheduled for:  05/13/2021      Plan:Telephone follow up appointment with care management team member scheduled for:  05/13/2021  Jacqlyn Larsen [facility], BSN RN Case Manager [place] [phone]

## 2021-03-23 DIAGNOSIS — G4733 Obstructive sleep apnea (adult) (pediatric): Secondary | ICD-10-CM | POA: Diagnosis not present

## 2021-03-27 ENCOUNTER — Ambulatory Visit
Admission: RE | Admit: 2021-03-27 | Discharge: 2021-03-27 | Disposition: A | Discharge status: Home / Self Care | Payer: PPO | Source: Ambulatory Visit | Attending: Family Medicine | Admitting: Family Medicine

## 2021-03-27 ENCOUNTER — Other Ambulatory Visit: Payer: Self-pay

## 2021-03-27 DIAGNOSIS — Z1231 Encounter for screening mammogram for malignant neoplasm of breast: Secondary | ICD-10-CM

## 2021-03-27 HISTORY — DX: Malignant neoplasm of unspecified site of unspecified female breast: C50.919

## 2021-04-02 ENCOUNTER — Ambulatory Visit (INDEPENDENT_AMBULATORY_CARE_PROVIDER_SITE_OTHER): Payer: PPO

## 2021-04-02 DIAGNOSIS — I495 Sick sinus syndrome: Secondary | ICD-10-CM

## 2021-04-02 LAB — CUP PACEART REMOTE DEVICE CHECK
Date Time Interrogation Session: 20220922121933
Implantable Lead Implant Date: 20170627
Implantable Lead Implant Date: 20170627
Implantable Lead Location: 753859
Implantable Lead Location: 753860
Implantable Lead Model: 377
Implantable Lead Model: 377
Implantable Lead Serial Number: 49436227
Implantable Lead Serial Number: 49471106
Implantable Pulse Generator Implant Date: 20170627
Pulse Gen Model: 394969
Pulse Gen Serial Number: 68786455

## 2021-04-06 ENCOUNTER — Other Ambulatory Visit: Payer: Self-pay

## 2021-04-06 ENCOUNTER — Ambulatory Visit (INDEPENDENT_AMBULATORY_CARE_PROVIDER_SITE_OTHER): Payer: PPO | Admitting: Family Medicine

## 2021-04-06 ENCOUNTER — Encounter: Payer: Self-pay | Admitting: Family Medicine

## 2021-04-06 ENCOUNTER — Telehealth: Payer: Self-pay | Admitting: *Deleted

## 2021-04-06 DIAGNOSIS — U071 COVID-19: Secondary | ICD-10-CM

## 2021-04-06 MED ORDER — MOLNUPIRAVIR EUA 200MG CAPSULE: 4.0000 | ORAL_CAPSULE | Freq: Two times a day (BID) | ORAL | 0 refills | Status: AC

## 2021-04-06 NOTE — Progress Notes (Signed)
   Subjective:    Patient ID: [name], female    DOB: 05-12-1951, 70 y.o.   MRN: [id number]  HPI  Patient tested positive for Covid on Friday. Patient  had my two Moderna shots and boosters. Patient has flu like symptoms and feels pretty crummy, but has had no high fever or dyspnea. She is drinking lots of fluids  Viral-like symptoms over the past few days body aches rundown feels tired denies wheezing or difficulty breathing patient is on multiple medicines.  To do drug analysis plus also talk with pharmacy/pharmacist Virtual Visit via Telephone Note  I connected with Karie Schwalbe on 04/06/21 at  4:50 PM EDT by telephone and verified that I am speaking with the correct person using two identifiers.  Location: Patient: home Provider: office   I discussed the limitations, risks, security and privacy concerns of performing an evaluation and management service by telephone and the availability of in person appointments. I also discussed with the patient that there may be a patient responsible charge related to this service. The patient expressed understanding and agreed to proceed.   History of Present Illness:    Observations/Objective:   Assessment and Plan:   Follow Up Instructions:    I discussed the assessment and treatment plan with the patient. The patient was provided an opportunity to ask questions and all were answered. The patient agreed with the plan and demonstrated an understanding of the instructions.   The patient was advised to call back or seek an in-person evaluation if the symptoms worsen or if the condition fails to improve as anticipated.  I provided 15 minutes of non-face-to-face time during this encounter.    Review of Systems     Objective:   Physical Exam  Today's visit was via telephone Physical exam was not possible for this visit       Assessment & Plan:   COVID infection  Covid infection This is a viral process.  Mild cases  are treated with supportive measures at home such as Tylenol rest fluids.  In some situations monoclonal antibodies may be appropriate depending on the patient's risk criteria.  The patient was educated regarding progressive illness including respiratory, persistent vomiting, change in mental status.  If any of these occur ER evaluation is recommended. Patient was educated about the following as well Covid-19 respiratory warning: Covid-19 is a virus that causes hypoxia (low oxygen level in blood) in some people. If you develop any changes in your usual breathing pattern: difficulty catching your breath, more short winded with activity or with resting, or anything that concerns you about your breathing, do not hesitate to go to the emergency department immediately for evaluation. Please do not delay to get treatment.   Agrees with plan of care discussed today. Understands warning signs to seek further care: Chest pain, shortness of breath, mental confusion, profuse vomiting, any significant change in health. Understands to follow-up if symptoms do not improve, or worsen.    Molnupiravir was prescribed Paxlovid had to many contraindications

## 2021-04-06 NOTE — Telephone Encounter (Signed)
Ms. evalina, tabak are scheduled for a virtual visit with your provider today.    Just as we do with appointments in the office, we must obtain your consent to participate.  Your consent will be active for this visit and any virtual visit you may have with one of our providers in the next 365 days.    If you have a MyChart account, I can also send a copy of this consent to you electronically.  All virtual visits are billed to your insurance company just like a traditional visit in the office.  As this is a virtual visit, video technology does not allow for your provider to perform a traditional examination.  This may limit your provider's ability to fully assess your condition.  If your provider identifies any concerns that need to be evaluated in person or the need to arrange testing such as labs, EKG, etc, we will make arrangements to do so.    Although advances in technology are sophisticated, we cannot ensure that it will always work on either your end or our end.  If the connection with a video visit is poor, we may have to switch to a telephone visit.  With either a video or telephone visit, we are not always able to ensure that we have a secure connection.   I need to obtain your verbal consent now.   Are you willing to proceed with your visit today?   [name] has provided verbal consent on 04/06/2021 for a virtual visit (video or telephone).

## 2021-04-06 NOTE — Telephone Encounter (Signed)
Nurses  Please connect with Neoma Laming.  If she is within 5 days of the beginning of [place] she is eligible for Paxlovid or molnupiravir if she is interested in utilizing a antiviral  Please let me know because of this is time sensitive

## 2021-04-08 NOTE — Progress Notes (Signed)
Remote pacemaker transmission.   

## 2021-04-10 DIAGNOSIS — I10 Essential (primary) hypertension: Secondary | ICD-10-CM

## 2021-04-13 DIAGNOSIS — G4733 Obstructive sleep apnea (adult) (pediatric): Secondary | ICD-10-CM | POA: Diagnosis not present

## 2021-04-16 ENCOUNTER — Encounter (INDEPENDENT_AMBULATORY_CARE_PROVIDER_SITE_OTHER): Payer: PPO | Admitting: Ophthalmology

## 2021-04-20 ENCOUNTER — Encounter: Payer: Self-pay | Admitting: Urgent Care

## 2021-04-20 ENCOUNTER — Ambulatory Visit (INDEPENDENT_AMBULATORY_CARE_PROVIDER_SITE_OTHER): Payer: PPO

## 2021-04-20 ENCOUNTER — Ambulatory Visit
Admission: EM | Admit: 2021-04-20 | Discharge: 2021-04-20 | Disposition: A | Discharge status: Home / Self Care | Payer: PPO | Attending: Urgent Care | Admitting: Urgent Care

## 2021-04-20 ENCOUNTER — Other Ambulatory Visit: Payer: Self-pay

## 2021-04-20 DIAGNOSIS — R5383 Other fatigue: Secondary | ICD-10-CM

## 2021-04-20 DIAGNOSIS — R0600 Dyspnea, unspecified: Secondary | ICD-10-CM

## 2021-04-20 DIAGNOSIS — R0602 Shortness of breath: Secondary | ICD-10-CM

## 2021-04-20 DIAGNOSIS — R5381 Other malaise: Secondary | ICD-10-CM

## 2021-04-20 DIAGNOSIS — I5032 Chronic diastolic (congestive) heart failure: Secondary | ICD-10-CM

## 2021-04-20 DIAGNOSIS — U071 COVID-19: Secondary | ICD-10-CM

## 2021-04-20 NOTE — Discharge Instructions (Addendum)
For sore throat or cough try using a honey-based tea. Use 3 teaspoons of honey with juice squeezed from half lemon. Place shaved pieces of ginger into 1/2-1 cup of water and warm over stove top. Then mix the ingredients and repeat every 4 hours as needed. Please take Tylenol 500mg -650mg  every 6 hours for aches and pains, fevers. Hydrate very well with at least 2 liters of water. Eat light meals such as soups to replenish electrolytes and soft fruits, veggies. Start an antihistamine like Zyrtec for postnasal drainage, sinus congestion.  You can take this together with pseudoephedrine (Sudafed) at a dose of 30 mg 2-3 times a day as needed for the same kind of congestion.

## 2021-04-20 NOTE — ED Provider Notes (Signed)
[place]   MRN: [id number] DOB: 12-10-1950  Subjective:   [name] is a 70 y.o. female presenting for 4-day history of recurrent fatigue, shortness of breath, dyspnea.  She does have a history of this as well.  Tested positive for COVID-19 on 04/06/2021.  Was prescribed molnupiravir which she completed.  Felt like she had improvement with the medication but symptoms returned at the end of last week.  Patient is not a smoker.  No history of asthma.  Has congestive heart failure, last echocardiogram was 11/29/2019 and had a normal ejection fraction.  She has a pacemaker.  No chest pain.  No history of pulmonary embolism.  No current facility-administered medications for this encounter.  Current Outpatient Medications:    acetaminophen (TYLENOL) 650 MG CR tablet, Take 650-1,300 mg by mouth See admin instructions. 1300MG  IN THE MORNING AND 650MG  AT BEDTIME, Disp: , Rfl:    ALPRAZolam (XANAX XR) 1 MG 24 hr tablet, Take 1 mg by mouth in the morning and at bedtime., Disp: , Rfl:    ALPRAZolam (XANAX) 1 MG tablet, Take 0.5 mg by mouth daily as needed for anxiety. , Disp: , Rfl:    aspirin 325 MG EC tablet, Take 325 mg by mouth daily., Disp: , Rfl:    glucosamine-chondroitin 500-400 MG tablet, Take 2 tablets by mouth every morning. , Disp: , Rfl:    lamoTRIgine (LAMICTAL) 200 MG tablet, Take 400 mg by mouth at bedtime., Disp: , Rfl:    Multiple Vitamin (MULTIVITAMIN) tablet, Take 1 tablet by mouth daily., Disp: , Rfl:    QUEtiapine (SEROQUEL) 200 MG tablet, Take 600 mg by mouth at bedtime., Disp: , Rfl:    rosuvastatin (CRESTOR) 10 MG tablet, Take 1 tablet (10 mg total) by mouth daily., Disp: 90 tablet, Rfl: 3   telmisartan (MICARDIS) 20 MG tablet, Take 0.5 tablets (10 mg total) by mouth daily., Disp: 45 tablet, Rfl: 3   Vitamin D, Ergocalciferol, (DRISDOL) 50000 units CAPS capsule, Take 50,000 Units by mouth every Sunday. 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 Lovena Le   Sleep apnea      Past Surgical History:  Procedure Laterality Date   ABDOMINAL HYSTERECTOMY     APPENDECTOMY     BREAST BIOPSY Left 2011   BREAST BIOPSY  2008   BREAST LUMPECTOMY Left 2008   CHOLECYSTECTOMY     COLONOSCOPY N/A 09/13/2013   Procedure: COLONOSCOPY;  Surgeon: [name], MD;  [facility];  Service: Endoscopy;  Laterality: N/A;  730   EP IMPLANTABLE DEVICE N/A 01/06/2016   Procedure: Pacemaker Implant;  Surgeon: [name], MD;  Location: [place] [place] [facility];  Service: Cardiovascular;  Laterality: N/A;   ESOPHAGOGASTRODUODENOSCOPY N/A 01/17/2014   Procedure: ESOPHAGOGASTRODUODENOSCOPY (EGD);  Surgeon: [name], MD;  [facility];  Service: Endoscopy;  Laterality: N/A;  245   RIGHT/LEFT HEART CATH AND CORONARY ANGIOGRAPHY N/A 01/01/2020   Procedure: RIGHT/LEFT HEART CATH AND CORONARY ANGIOGRAPHY;  Surgeon: [name], MD;  Location: [place] [facility];  Service: Cardiovascular;  Laterality: N/A;   TOTAL KNEE ARTHROPLASTY Bilateral right knee   2012, 2007    Family History  Problem Relation Age of Onset   Bipolar disorder Mother    Diabetes Mother    Bipolar disorder Sister    Diabetes Sister    Alcohol abuse Father    Hypertension Father     Heart disease Other     Social History   Tobacco Use   Smoking status: Never   Smokeless tobacco: Never  Vaping Use   Vaping Use: Never used  Substance Use Topics   Alcohol use: No    Alcohol/week: 0.0 standard drinks   Drug use: No    ROS   Objective:   Vitals: BP 140/81 (BP Location: Right Arm)   Pulse 97   Temp 97.8 F (36.6 C) (Oral)   Resp 16   SpO2 95%   Physical Exam Constitutional:      General: She is not in acute distress.    Appearance: Normal appearance. She is well-developed. She is obese. She is not ill-appearing, toxic-appearing or diaphoretic.  HENT:     Head: Normocephalic and atraumatic.     Nose: Nose normal.     Mouth/Throat:     Mouth: Mucous membranes are moist.  Eyes:     Extraocular Movements: Extraocular movements intact.     Pupils: Pupils are equal, round, and reactive to light.  Cardiovascular:     Rate and Rhythm: Normal rate and regular rhythm.     Pulses: Normal pulses.     Heart sounds: Normal heart sounds. No murmur heard.   No friction rub. No gallop.  Pulmonary:     Effort: Pulmonary effort is normal. No respiratory distress.     Breath sounds: No stridor. No wheezing, rhonchi or rales.     Comments: Decreased lung sounds which may be due to body habitus. Skin:    General: Skin is warm and dry.     Findings: No rash.  Neurological:     Mental Status: She is alert and oriented to person, place, and time.  Psychiatric:        Mood and Affect: Mood normal.        Behavior: Behavior normal.        Thought Content: Thought content normal.    DG Chest 2 View  Result Date: 04/20/2021 CLINICAL DATA:  Shortness of breath EXAM: CHEST - 2 VIEW COMPARISON:  Chest radiograph 01/26/2021 FINDINGS: A left chest wall cardiac device and associated leads are stable. The cardiomediastinal silhouette is stable. Minimal opacity in the lateral left base likely reflects subsegmental atelectasis. Otherwise, there is no focal consolidation or  pulmonary edema. There is no pleural effusion or pneumothorax. There is mild multilevel degenerative change of the thoracic spine. There is no acute osseous abnormality. IMPRESSION: No radiographic evidence of acute cardiopulmonary process. Electronically Signed   By: Valetta Mole M.D.   On: 04/20/2021 14:50     Assessment and Plan :   PDMP not reviewed this encounter.  1. Dyspnea, unspecified type  2. Shortness of breath   3. Malaise and fatigue   4. COVID-19   5. Chronic diastolic congestive heart failure (HCC)     Vital signs hemodynamically stable. Recommended very close follow up with her PCP. Use supportive care otherwise, maintain all regular medications. Will avoid ER visit for now but counseled on ER precautions as the next step would be chest CT scan if she continues to have symptoms Counseled patient on potential for adverse effects with medications prescribed/recommended today, ER and return-to-clinic precautions discussed, patient verbalized understanding.    Jaynee Eagles, PA-C 04/20/21 [id number]

## 2021-04-21 ENCOUNTER — Telehealth: Payer: Self-pay | Admitting: Cardiology

## 2021-04-21 ENCOUNTER — Encounter (INDEPENDENT_AMBULATORY_CARE_PROVIDER_SITE_OTHER): Payer: PPO | Admitting: Ophthalmology

## 2021-04-21 NOTE — Telephone Encounter (Signed)
Please give pt a call - having shortness of breath and weakness   [phone]

## 2021-04-21 NOTE — Telephone Encounter (Signed)
Returned the call to pt.  Pt states she was having some fatique and weakness.  Pt was recently diagnosed with Covid and states her symptoms have came back but her test is negative. Went to Urgent Care yesterday and was advised to f/u with PCP.  Pt denies Chest Pain.  Does have some SOB, but not new.   Advised pt to call her PCP to schedule a follow-up appointment and she verbalized understanding and was thankful for the call back.

## 2021-04-25 ENCOUNTER — Other Ambulatory Visit: Payer: Self-pay | Admitting: Internal Medicine

## 2021-04-28 ENCOUNTER — Ambulatory Visit (INDEPENDENT_AMBULATORY_CARE_PROVIDER_SITE_OTHER): Payer: PPO | Admitting: Family Medicine

## 2021-04-28 ENCOUNTER — Other Ambulatory Visit: Payer: Self-pay

## 2021-04-28 ENCOUNTER — Other Ambulatory Visit (HOSPITAL_COMMUNITY)
Admission: RE | Admit: 2021-04-28 | Discharge: 2021-04-28 | Disposition: A | Discharge status: Home / Self Care | Payer: PPO | Source: Ambulatory Visit | Attending: Family Medicine | Admitting: Family Medicine

## 2021-04-28 ENCOUNTER — Encounter: Payer: Self-pay | Admitting: Family Medicine

## 2021-04-28 VITALS — BP 133/82 | HR 90 | Temp 97.0°F | Ht 64.0 in | Wt 275.0 lb

## 2021-04-28 DIAGNOSIS — R0609 Other forms of dyspnea: Secondary | ICD-10-CM

## 2021-04-28 DIAGNOSIS — F3174 Bipolar disorder, in full remission, most recent episode manic: Secondary | ICD-10-CM | POA: Diagnosis not present

## 2021-04-28 DIAGNOSIS — F3175 Bipolar disorder, in partial remission, most recent episode depressed: Secondary | ICD-10-CM | POA: Diagnosis not present

## 2021-04-28 DIAGNOSIS — U099 Post covid-19 condition, unspecified: Secondary | ICD-10-CM | POA: Insufficient documentation

## 2021-04-28 LAB — D-DIMER, QUANTITATIVE: D-Dimer, Quant: 0.34 ug/mL-FEU (ref 0.00–0.50)

## 2021-04-28 LAB — CREATININE, SERUM
Creatinine, Ser: 0.79 mg/dL (ref 0.44–1.00)
GFR, Estimated: >60 mL/min (ref >60)

## 2021-04-28 NOTE — Progress Notes (Signed)
   Subjective:    Patient ID: [name], female    DOB: September 21, 1950, 70 y.o.   MRN: [id number]  HPI  Dyspnea w/ excertion follow up UC on 04/20/21- patient reports more fatigue and SOB w/ exertion after Covid 3 weeks ago  Fatigue tiredness feeling run down  Was not having this before COVID.  She relates a fair amount of shortness of breath with activity denies swelling in the legs no PND denies any chest tightness pressure pain does have some intermittent coughing not bringing up any phlegm.  Also having some trouble with mental focus ever since having COVID. Patient on multiple medicines med list reviewed   Review of Systems     Objective:   Physical Exam Lungs clear respiratory rate normal heart regular pulse normal       Assessment & Plan:  1. DOE (dyspnea on exertion) Significant shortness of breath with activity we will check a D-dimer.  If this is negative then does not need to have CT scan to rule out pulmonary embolus.  I have encouraged patient do some walking and gradually increase the amount hopefully over the course the next few weeks or see significant improvement - D-Dimer, Quantitative - Creatinine, serum  2. Post-COVID chronic dyspnea It is possible that this could persist for quite some time but hopefully over the next few weeks should see improvement recheck her in 4 weeks - D-Dimer, Quantitative - Creatinine, serum  It should be noted that the D-dimer came back normal.  Therefore very unlikely to have pulmonary blood clot no need to have any type of intervention at this point

## 2021-04-29 ENCOUNTER — Encounter (INDEPENDENT_AMBULATORY_CARE_PROVIDER_SITE_OTHER): Payer: Self-pay | Admitting: Ophthalmology

## 2021-04-29 ENCOUNTER — Ambulatory Visit (INDEPENDENT_AMBULATORY_CARE_PROVIDER_SITE_OTHER): Payer: PPO | Admitting: Ophthalmology

## 2021-04-29 DIAGNOSIS — H34232 Retinal artery branch occlusion, left eye: Secondary | ICD-10-CM

## 2021-04-29 DIAGNOSIS — G4733 Obstructive sleep apnea (adult) (pediatric): Secondary | ICD-10-CM | POA: Diagnosis not present

## 2021-04-29 DIAGNOSIS — H43821 Vitreomacular adhesion, right eye: Secondary | ICD-10-CM

## 2021-04-29 DIAGNOSIS — H353132 Nonexudative age-related macular degeneration, bilateral, intermediate dry stage: Secondary | ICD-10-CM | POA: Diagnosis not present

## 2021-04-29 NOTE — Assessment & Plan Note (Signed)
As of this date Minor VM attachment still noticed without inner retinal foveal traction

## 2021-04-29 NOTE — Assessment & Plan Note (Signed)
Compliant, use

## 2021-04-29 NOTE — Progress Notes (Signed)
04/29/2021     CHIEF COMPLAINT Patient presents for  Chief Complaint  Patient presents with   Retina Follow Up    2 Wk F/U, FP/FFA L/R  Pt sts OS temporally seems "opaque." Pt denies ocular pain. OD stable. Pt reports occasional flash of light OD.       HISTORY OF PRESENT ILLNESS: [name] is a 70 y.o. female who presents to the clinic today for:   HPI     Retina Follow Up   Patient presents with  Other (BRAO).  In left eye.  This started 4 weeks ago.  Severity is moderate.  Duration of 4 weeks.  Since onset it is stable. Additional comments: 2 Wk F/U, FP/FFA L/R  Pt sts OS temporally seems "opaque." Pt denies ocular pain. OD stable. Pt reports occasional flash of light OD.         Comments   4 mos fu OU oct. Patient states vision is stable and unchanged since last visit. Denies any new floaters or FOL. Pt states she is on Lutein 20mg  qam, and multivitamin with magnesium qam. 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       Last edited by Laurin Coder on 04/29/2021  1:22 PM.      Referring physician: Kathyrn Drown, MD [address] [place],  [place] [address]  HISTORICAL INFORMATION:   Selected notes from the MEDICAL RECORD NUMBER    Lab Results  Component Value Date   HGBA1C 5.9 (H) 02/05/2021     CURRENT MEDICATIONS: No current outpatient medications on file. (Ophthalmic Drugs)   No current facility-administered medications for this visit. (Ophthalmic Drugs)   Current Outpatient Medications (Other)  Medication Sig   acetaminophen (TYLENOL) 650 MG CR tablet Take 650-1,300 mg by mouth See admin instructions. 1300MG  IN THE MORNING AND 650MG  AT BEDTIME   ALPRAZolam (XANAX XR) 1 MG 24 hr tablet Take 1 mg by mouth in the morning and at bedtime.   ALPRAZolam (XANAX)  1 MG tablet Take 0.5 mg by mouth daily as needed for anxiety.    aspirin 325 MG EC tablet Take 325 mg by mouth daily.   glucosamine-chondroitin 500-400 MG tablet Take 2 tablets by mouth every morning.    lamoTRIgine (LAMICTAL) 200 MG tablet Take 400 mg by mouth at bedtime.   Multiple Vitamin (MULTIVITAMIN) tablet Take 1 tablet by mouth daily.   QUEtiapine (SEROQUEL) 200 MG tablet Take 600 mg by mouth at bedtime.   rosuvastatin (CRESTOR) 10 MG tablet Take 1 tablet (10 mg total) by mouth daily.   telmisartan (MICARDIS) 20 MG tablet Take 0.5 tablets (10 mg total) by mouth daily.   Vitamin D, Ergocalciferol, (DRISDOL) 50000 units CAPS capsule Take 50,000 Units by mouth every Sunday.    No current facility-administered medications for this visit. 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 Lovena Le   Sleep apnea    Past Surgical History:  Procedure Laterality Date   ABDOMINAL HYSTERECTOMY     APPENDECTOMY     BREAST BIOPSY Left 2011   BREAST BIOPSY  2008   BREAST LUMPECTOMY Left 2008   CHOLECYSTECTOMY     COLONOSCOPY N/A 09/13/2013   Procedure: COLONOSCOPY;  Surgeon: [name], MD;  [facility];  Service: Endoscopy;  Laterality: N/A;  730    EP IMPLANTABLE DEVICE N/A 01/06/2016   Procedure: Pacemaker Implant;  Surgeon: [name], MD;  Location: [facility];  Service: Cardiovascular;  Laterality: N/A;   ESOPHAGOGASTRODUODENOSCOPY N/A 01/17/2014   Procedure: ESOPHAGOGASTRODUODENOSCOPY (EGD);  Surgeon: [name], MD;  Location: [facility];  Service: Endoscopy;  Laterality: N/A;  245   RIGHT/LEFT HEART CATH AND CORONARY ANGIOGRAPHY N/A 01/01/2020   Procedure: RIGHT/LEFT HEART CATH AND CORONARY ANGIOGRAPHY;  Surgeon: [name], MD;  Location: [facility];  Service: Cardiovascular;  Laterality: N/A;   TOTAL KNEE ARTHROPLASTY Bilateral right knee   2012, 2007    FAMILY HISTORY Family History  Problem Relation Age of Onset   Bipolar disorder Mother    Diabetes Mother    Bipolar disorder Sister    Diabetes Sister    Alcohol abuse Father    Hypertension Father    Heart disease Other     SOCIAL HISTORY Social History   Tobacco Use   Smoking status: Never   Smokeless tobacco: Never  Vaping Use   Vaping Use: Never used  Substance Use Topics   Alcohol use: No    Alcohol/week: 0.0 standard drinks   Drug use: No         OPHTHALMIC EXAM:  Base Eye Exam     Visual Acuity (ETDRS)       Right Left   Dist [place] 20/40 -2 20/30 -1   Dist ph [place] 20/30 -2          Tonometry (Tonopen, 1:21 PM)       Right Left   Pressure 11 15         Pupils       Pupils Dark Light Shape React APD   Right PERRL 5 4 Round Brisk None   Left PERRL 5 4 Round Brisk None         Extraocular Movement       Right Left    Full Full         Neuro/Psych     Oriented x3: Yes   Mood/Affect: Normal         Dilation     Both eyes: 1.0% Mydriacyl, 2.5% Phenylephrine @ 1:21 PM           Slit Lamp and Fundus Exam     External Exam       Right Left   External Normal Normal         Slit Lamp Exam       Right  Left   Lids/Lashes Normal Normal   Conjunctiva/Sclera White and  quiet White and quiet   Cornea Clear Clear   Anterior Chamber Deep and quiet Deep and quiet   Iris Round and reactive Round and reactive   Lens Centered posterior chamber intraocular lens Centered posterior chamber intraocular lens, 1+ Posterior capsular opacification   Anterior Vitreous Normal Normal         Fundus Exam       Right Left   Posterior Vitreous Normal Normal   Disc Normal Normal   C/D Ratio 0.1 0.2   Macula 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, Soft drusen, pigment over drusen INF,  no macular thickening, no membrane, no hemorrhage 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, Soft drusen, no macular thickening, no membrane, no hemorrhage   Vessels Normal EmbolusPartial plaque occlusion cholesterol, superior to the optic nerve, This suggests branch retinal artery occlusion, incomplete, peripapillary cotton wool spots have subsided   Periphery Normal no holes or tears, 28 and 20 diopter lens Normal no holes or tears, 28 and 20 diopter lens            IMAGING AND PROCEDURES  Imaging and Procedures for 04/29/21  OCT, Retina - OU - Both Eyes       Right Eye Quality was good. Scan locations included subfoveal. Central Foveal Thickness: 326. Findings include abnormal foveal contour, retinal drusen , subretinal fluid.   Left Eye Quality was good. Scan locations included subfoveal. Central Foveal Thickness: 320. Progression has been stable. Findings include retinal drusen , no IRF, no SRF.   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  Vitreomacular adhesion of right eye As of this date Minor VM attachment still noticed without inner retinal foveal traction     ICD-10-CM   1. 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 Obstructive sleep apnea of adult  G47.33     3. Intermediate stage nonexudative age-related macular degeneration of both eyes  H35.3132     4. Vitreomacular adhesion of right eye  H43.821       1.  No signs of wet AMD OU.  2.  OD with a small subset foveal serous like detachment beneath vitelliform type lesion in a foveal location continues to abate and subside slowly now at 7 months post reinstitution of excellent compliance with CPAP use preventing nightly hypoxic macular damage superimposed hypertensive damage as well  3.  Ophthalmic Meds Ordered this visit:  No orders of the defined types were placed in this encounter.      Return in about 4 months (around 08/30/2021) for DILATE OU, OCT.  There are no Patient Instructions on file for this visit.   Explained the diagnoses, plan, and follow up with the patient and they expressed understanding.  Patient expressed understanding of the  importance of proper follow up care.   Clent Demark Kristyanna Barcelo M.D. Diseases & Surgery of the Retina and Vitreous [facility] 04/29/21     Abbreviations: M myopia (nearsighted); A astigmatism; H hyperopia (farsighted); P presbyopia; Mrx spectacle prescription;  CTL contact lenses; OD right eye; OS left eye; OU both eyes  XT exotropia; ET esotropia; PEK punctate epithelial keratitis; PEE punctate epithelial erosions; DES dry eye syndrome; MGD meibomian gland dysfunction; ATs artificial tears; PFAT's preservative free artificial tears; Simpson nuclear sclerotic  cataract; PSC posterior subcapsular cataract; ERM epi-retinal membrane; PVD posterior vitreous detachment; RD retinal detachment; DM diabetes mellitus; DR diabetic retinopathy; NPDR non-proliferative diabetic retinopathy; PDR proliferative diabetic retinopathy; CSME clinically significant macular edema; DME diabetic macular edema; dbh dot blot hemorrhages; CWS cotton wool spot; POAG primary open angle glaucoma; C/D cup-to-disc ratio; HVF humphrey visual field; GVF goldmann visual field; OCT optical coherence tomography; IOP intraocular pressure; BRVO Branch retinal vein occlusion; CRVO central retinal vein occlusion; CRAO central retinal artery occlusion; BRAO branch retinal artery occlusion; RT retinal tear; SB scleral buckle; PPV pars plana vitrectomy; VH Vitreous hemorrhage; PRP panretinal laser photocoagulation; IVK intravitreal kenalog; VMT vitreomacular traction; MH Macular hole;  NVD neovascularization of the disc; NVE neovascularization elsewhere; AREDS age related eye disease study; ARMD age related macular degeneration; POAG primary open angle glaucoma; EBMD epithelial/anterior basement membrane dystrophy; ACIOL anterior chamber intraocular lens; IOL intraocular lens; PCIOL posterior chamber intraocular lens; Phaco/IOL phacoemulsification with intraocular lens placement; [place] photorefractive keratectomy; LASIK laser assisted in situ keratomileusis; HTN hypertension; DM diabetes mellitus; COPD chronic obstructive pulmonary disease

## 2021-04-29 NOTE — Assessment & Plan Note (Signed)
Large subfoveal drusen vitelliform lichen formation with a relative translucency deep to the fovea, has decreased in thickness from 221 m some 7 months previous to now 175 m in height coincident with excellent compliance with nightly CPAP use.

## 2021-05-08 ENCOUNTER — Telehealth: Payer: Self-pay | Admitting: *Deleted

## 2021-05-08 NOTE — Chronic Care Management (AMB) (Signed)
  Care Management   Note  05/08/2021 Name: [name] MRN: [id number] DOB: 1951-05-16  [name] is a 70 y.o. year old female who is a primary care patient of Luking, Elayne Snare, MD and is actively engaged with the care management team. I reached out to Karie Schwalbe by phone today to assist with re-scheduling a follow up visit with the RN Case Manager  Follow up plan: Patient declines further follow up and engagement by the care management team. Appropriate care team members and provider have been notified via electronic communication. The care management team is available to follow up with the patient after provider conversation with the patient regarding recommendation for care management engagement and subsequent re-referral to the care management team.   [facility]  Direct Dial: [phone]

## 2021-05-11 ENCOUNTER — Ambulatory Visit: Payer: Self-pay | Admitting: *Deleted

## 2021-05-11 DIAGNOSIS — I5032 Chronic diastolic (congestive) heart failure: Secondary | ICD-10-CM

## 2021-05-11 DIAGNOSIS — I10 Essential (primary) hypertension: Secondary | ICD-10-CM

## 2021-05-11 NOTE — Chronic Care Management (AMB) (Signed)
   05/11/2021  [name] 1951-01-03 [id number]   RN care manager received notification from [facility] care guide that patient declines any further services.  Case closed.  Jacqlyn Larsen [facility] BSN RN Case Manager [facility] [phone]

## 2021-05-13 ENCOUNTER — Telehealth: Payer: PPO

## 2021-05-14 DIAGNOSIS — G4733 Obstructive sleep apnea (adult) (pediatric): Secondary | ICD-10-CM | POA: Diagnosis not present

## 2021-05-18 ENCOUNTER — Encounter: Payer: Self-pay | Admitting: Family Medicine

## 2021-05-25 DIAGNOSIS — G4733 Obstructive sleep apnea (adult) (pediatric): Secondary | ICD-10-CM | POA: Diagnosis not present

## 2021-06-09 ENCOUNTER — Other Ambulatory Visit: Payer: Self-pay

## 2021-06-09 ENCOUNTER — Ambulatory Visit (INDEPENDENT_AMBULATORY_CARE_PROVIDER_SITE_OTHER): Payer: PPO | Admitting: Family Medicine

## 2021-06-09 ENCOUNTER — Encounter: Payer: Self-pay | Admitting: Family Medicine

## 2021-06-09 VITALS — BP 126/78 | Temp 97.2°F | Wt 273.8 lb

## 2021-06-09 DIAGNOSIS — I495 Sick sinus syndrome: Secondary | ICD-10-CM

## 2021-06-09 DIAGNOSIS — E781 Pure hyperglyceridemia: Secondary | ICD-10-CM

## 2021-06-09 DIAGNOSIS — Z23 Encounter for immunization: Secondary | ICD-10-CM | POA: Diagnosis not present

## 2021-06-09 DIAGNOSIS — Z79899 Other long term (current) drug therapy: Secondary | ICD-10-CM

## 2021-06-09 DIAGNOSIS — R7303 Prediabetes: Secondary | ICD-10-CM

## 2021-06-09 NOTE — Progress Notes (Signed)
   Subjective:    Patient ID: [name], female    DOB: [birth year], 70 y.o.   MRN: [id number]  HPI Pt here for follow up on DOE. Feeling a little better. Has not been taking blood pressure medication due to bringing blood pressure down to low and gets "these sinking shortness of breath".  Pt husband was home for a few days and pt reports he was manic and demanding. She is under fair amount of stress associated with that She is trying to the best she can She is taking her medicines She stopped taking blood pressure medicine because her blood pressure readings were too low She feels like things are going fairly well currently  Review of Systems     Objective:   Physical Exam General-in no acute distress Eyes-no discharge Lungs-respiratory rate normal, CTA CV-no murmurs,RRR Extremities skin warm dry no edema Neuro grossly normal Behavior normal, alert        Assessment & Plan:  1. Sinus node dysfunction (HCC) Under the care of cardiology overall doing well currently  2. Hypertriglyceridemia Continue cholesterol medication.  Help lower the risk of stroke - Basic Metabolic Panel (BMET) - Hemoglobin A1c - Lipid Profile  3. Prediabetes Previous A1c looks good recommend A1c within the next 30 days or at least before next follow-up visit monitor diet closely healthy diet regular activity - Basic Metabolic Panel (BMET) - Hemoglobin A1c - Lipid Profile  4. Need for vaccination Flu shot - Flu Vaccine QUAD 6+ mos PF IM (Fluarix Quad PF)  5. High risk medication use Metabolic 7 ordered because medications - Basic Metabolic Panel (BMET) - Hemoglobin A1c - Lipid Profile  Because she has had low blood pressure readings and her current blood pressure reading is good we will discontinue her Micardis

## 2021-06-13 DIAGNOSIS — G4733 Obstructive sleep apnea (adult) (pediatric): Secondary | ICD-10-CM | POA: Diagnosis not present

## 2021-06-17 DIAGNOSIS — F4323 Adjustment disorder with mixed anxiety and depressed mood: Secondary | ICD-10-CM | POA: Diagnosis not present

## 2021-06-22 ENCOUNTER — Ambulatory Visit
Admission: EM | Admit: 2021-06-22 | Discharge: 2021-06-22 | Disposition: A | Discharge status: Home / Self Care | Payer: PPO | Attending: Family Medicine | Admitting: Family Medicine

## 2021-06-22 ENCOUNTER — Other Ambulatory Visit: Payer: Self-pay

## 2021-06-22 DIAGNOSIS — R0602 Shortness of breath: Secondary | ICD-10-CM

## 2021-06-22 DIAGNOSIS — R5383 Other fatigue: Secondary | ICD-10-CM

## 2021-06-22 DIAGNOSIS — I5032 Chronic diastolic (congestive) heart failure: Secondary | ICD-10-CM

## 2021-06-22 NOTE — ED Triage Notes (Signed)
Patient states she Covid a few months ago.   Patient states she has been SOB for the past 3 days and just feeling drained.   She states she took Coricidin at bedtime but no meds today.  Denies Fever.   Denies Exposure.

## 2021-06-22 NOTE — ED Provider Notes (Signed)
[facility]    CSN: [id number] Arrival date & time: 06/22/21  1412      History   Chief Complaint No chief complaint on file.   HPI [name] is a 70 y.o. female.   Presenting today with 3-day history of shortness of breath, severe fatigue.  Denies congestion, sore throat, cough, chest pain, palpitations, abdominal pain, nausea vomiting diarrhea.  Taking Coricidin with no relief.  Had COVID a few months ago and has had off-and-on shortness of breath since then.  Also has heart failure but this is well controlled with current medications and she is not having any leg swelling or orthopnea.   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   Cotton wool spots 12/01/2020   Obstructive sleep apnea of adult 08/26/2020   Cystoid macular edema, right eye 08/12/2020   Intermediate stage nonexudative age-related macular degeneration of both eyes 08/12/2020   Vitreomacular adhesion of right eye 08/12/2020   Serous retinal detachment of right eye 08/12/2020   Type 2 macular telangiectasis, 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   Essential hypertension 03/06/2020   Prediabetes 02/10/2020   Cardiac pacemaker in situ 01/07/2020   Bipolar 2 disorder, major depressive episode ([place]) 01/07/2020   At risk for central sleep apnea  01/07/2020   Obesity with alveolar hypoventilation and body mass index (BMI) of 40 or greater ([place]) 01/07/2020   Precordial chest pain 12/31/2019   DOE (dyspnea on exertion) 12/05/2019   Chronic diastolic CHF (congestive heart failure) ([place]) 12/05/2019   Memory loss 12/12/2017   Morbid obesity ([place]) 09/05/2017   Sinus node dysfunction ([place]) 01/06/2016   Bradycardia 10/22/2015   Bipolar disorder ([place]) 10/22/2015   Anxiety 10/22/2015   Pre-syncope 10/22/2015   Episodic lightheadedness    GERD (gastroesophageal reflux disease) 01/08/2014   Orthostatic hypotension - resolved 11/28/2013   Palpitations 11/28/2013   Osteoarthritis 11/08/2013   Lumbar back pain 11/08/2013   Hypertriglyceridemia 08/06/2013   Depression 04/20/2013   History of left breast cancer (2013) 07/22/2011    Past Surgical History:  Procedure Laterality Date   ABDOMINAL HYSTERECTOMY     APPENDECTOMY     BREAST BIOPSY Left 2011   BREAST BIOPSY  2008   BREAST LUMPECTOMY Left 2008   CHOLECYSTECTOMY     COLONOSCOPY N/A 09/13/2013   Procedure: COLONOSCOPY;  Surgeon: [name], MD;  [facility];  Service: Endoscopy;  Laterality: N/A;  730   EP IMPLANTABLE DEVICE N/A 01/06/2016   Procedure: Pacemaker Implant;  Surgeon: [name], MD;  Location: [facility];  Service: Cardiovascular;  Laterality: N/A;   ESOPHAGOGASTRODUODENOSCOPY N/A 01/17/2014   Procedure: ESOPHAGOGASTRODUODENOSCOPY (EGD);  Surgeon: Mechele Dawley  Laural Golden, MD;  [facility];  Service: Endoscopy;  Laterality: N/A;  245   RIGHT/LEFT HEART CATH AND CORONARY ANGIOGRAPHY N/A 01/01/2020   Procedure: RIGHT/LEFT HEART CATH AND CORONARY ANGIOGRAPHY;  Surgeon: [name], MD;  Location: [facility];  Service: Cardiovascular;  Laterality: N/A;   TOTAL KNEE ARTHROPLASTY Bilateral right knee   2012, 2007    OB History   No obstetric history on file.      Home Medications    Prior to Admission medications    Medication Sig Start Date End Date Taking? Authorizing Provider  acetaminophen (TYLENOL) 650 MG CR tablet Take 650-1,300 mg by mouth See admin instructions. 1300MG  IN THE MORNING AND 650MG  AT BEDTIME    [provider]  ALPRAZolam (XANAX XR) 1 MG 24 hr tablet Take 1 mg by mouth in the morning and at bedtime.    [provider]  ALPRAZolam [name]) 1 MG tablet Take 0.5 mg by mouth daily as needed for anxiety.     [provider]  aspirin 325 MG EC tablet Take 325 mg by mouth daily.    [provider]  glucosamine-chondroitin 500-400 MG tablet Take 2 tablets by mouth every morning.     [provider]  lamoTRIgine (LAMICTAL) 200 MG tablet Take 400 mg by mouth at bedtime.    [provider]  Multiple Vitamin (MULTIVITAMIN) tablet Take 1 tablet by mouth daily.    [provider]  QUEtiapine (SEROQUEL) 200 MG tablet Take 600 mg by mouth at bedtime. 05/07/13   [name], MD  rosuvastatin (CRESTOR) 10 MG tablet Take 1 tablet (10 mg total) by mouth daily. 12/02/20 04/20/21  [name], MD  Vitamin D, Ergocalciferol, (DRISDOL) 50000 units CAPS capsule Take 50,000 Units by mouth every Sunday.     [provider]    Family History Family History  Problem Relation Age of Onset   Bipolar disorder Mother    Diabetes Mother    Bipolar disorder Sister    Diabetes Sister    Alcohol abuse Father    Hypertension Father    Heart disease Other     Social History Social History   Tobacco Use   Smoking status: Never   Smokeless tobacco: Never  Vaping Use   Vaping Use: Never used  Substance Use Topics   Alcohol use: No    Alcohol/week: 0.0 standard drinks   Drug use: No     Allergies   Imipramine and Tricor [fenofibrate]   Review of Systems Review of Systems Per HPI  Physical Exam Triage Vital Signs ED Triage Vitals  Enc Vitals Group     BP 06/22/21 1602 (!) 142/83     Pulse Rate 06/22/21 1602 66      Resp 06/22/21 1602 18     Temp 06/22/21 1602 97.6 F (36.4 C)     Temp Source 06/22/21 1602 Oral     SpO2 06/22/21 1602 92 %     Weight --      Height --      Head Circumference --      Peak Flow --      Pain Score 06/22/21 1558 0     Pain Loc --      Pain Edu? --      Excl. in [place]? --    No data found.  Updated Vital Signs BP (!) 142/83 (BP Location: Right Arm)   Pulse 66   Temp 97.6 F (  36.4 C) (Oral)   Resp 18   SpO2 95%   Visual Acuity Right Eye Distance:   Left Eye Distance:   Bilateral Distance:    Right Eye Near:   Left Eye Near:    Bilateral Near:     Physical Exam Vitals and nursing note reviewed.  Constitutional:      Appearance: Normal appearance.  HENT:     Head: Atraumatic.     Right Ear: Tympanic membrane and external ear normal.     Left Ear: Tympanic membrane and external ear normal.     Nose: Nose normal.     Mouth/Throat:     Mouth: Mucous membranes are moist.  Eyes:     Extraocular Movements: Extraocular movements intact.     Conjunctiva/sclera: Conjunctivae normal.  Cardiovascular:     Rate and Rhythm: Normal rate and regular rhythm.     Heart sounds: Normal heart sounds.  Pulmonary:     Effort: Pulmonary effort is normal.     Breath sounds: Normal breath sounds. No wheezing or rales.  Musculoskeletal:        General: No swelling. Normal range of motion.     Cervical back: Normal range of motion and neck supple.  Skin:    General: Skin is warm and dry.  Neurological:     Mental Status: She is alert and oriented to person, place, and time.  Psychiatric:        Mood and Affect: Mood normal.        Thought Content: Thought content normal.     UC Treatments / Results  Labs (all labs ordered are listed, but only abnormal results are displayed) Labs Reviewed  COVID-19, FLU A+B NAA    EKG   Radiology No results found.  Procedures Procedures (including critical care time)  Medications Ordered in UC Medications - No  data to display  Initial Impression / Assessment and Plan / UC Course  I have reviewed the triage vital signs and the nursing notes.  Pertinent labs & imaging results that were available during my care of the patient were reviewed by me and considered in my medical decision making (see chart for details).     Vital signs and exam benign and reassuring with no focal abnormalities.  COVID and flu testing pending for rule out, very unlikely to be related to her heart failure.  Possibly a post-COVID effect.  Continue to monitor, follow-up for any worsening symptoms.  Final Clinical Impressions(s) / UC Diagnoses   Final diagnoses:  Chronic diastolic CHF (congestive heart failure) (HCC)  SOB (shortness of breath)  Fatigue, unspecified type   Discharge Instructions   None    ED Prescriptions   None    PDMP not reviewed this encounter.   Volney American, [place] 06/22/21 1958

## 2021-06-23 DIAGNOSIS — Z6841 Body Mass Index (BMI) 40.0 and over, adult: Secondary | ICD-10-CM | POA: Diagnosis not present

## 2021-06-23 DIAGNOSIS — G4733 Obstructive sleep apnea (adult) (pediatric): Secondary | ICD-10-CM | POA: Diagnosis not present

## 2021-06-23 DIAGNOSIS — I6523 Occlusion and stenosis of bilateral carotid arteries: Secondary | ICD-10-CM | POA: Diagnosis not present

## 2021-06-23 DIAGNOSIS — E785 Hyperlipidemia, unspecified: Secondary | ICD-10-CM | POA: Diagnosis not present

## 2021-06-23 DIAGNOSIS — I495 Sick sinus syndrome: Secondary | ICD-10-CM | POA: Diagnosis not present

## 2021-06-23 DIAGNOSIS — F419 Anxiety disorder, unspecified: Secondary | ICD-10-CM | POA: Diagnosis not present

## 2021-06-23 DIAGNOSIS — G8929 Other chronic pain: Secondary | ICD-10-CM | POA: Diagnosis not present

## 2021-06-23 DIAGNOSIS — E662 Morbid (severe) obesity with alveolar hypoventilation: Secondary | ICD-10-CM | POA: Diagnosis not present

## 2021-06-23 DIAGNOSIS — F319 Bipolar disorder, unspecified: Secondary | ICD-10-CM | POA: Diagnosis not present

## 2021-06-23 DIAGNOSIS — E559 Vitamin D deficiency, unspecified: Secondary | ICD-10-CM | POA: Diagnosis not present

## 2021-06-23 DIAGNOSIS — F1320 Sedative, hypnotic or anxiolytic dependence, uncomplicated: Secondary | ICD-10-CM | POA: Diagnosis not present

## 2021-06-23 DIAGNOSIS — M1990 Unspecified osteoarthritis, unspecified site: Secondary | ICD-10-CM | POA: Diagnosis not present

## 2021-06-23 LAB — COVID-19, FLU A+B NAA
Influenza A, NAA: DETECTED — AB
Influenza B, NAA: NOT DETECTED
SARS-CoV-2, NAA: NOT DETECTED

## 2021-07-02 ENCOUNTER — Ambulatory Visit (INDEPENDENT_AMBULATORY_CARE_PROVIDER_SITE_OTHER): Payer: PPO

## 2021-07-02 ENCOUNTER — Telehealth: Payer: PPO | Admitting: Physician Assistant

## 2021-07-02 DIAGNOSIS — I495 Sick sinus syndrome: Secondary | ICD-10-CM | POA: Diagnosis not present

## 2021-07-02 DIAGNOSIS — R3989 Other symptoms and signs involving the genitourinary system: Secondary | ICD-10-CM | POA: Diagnosis not present

## 2021-07-02 LAB — CUP PACEART REMOTE DEVICE CHECK
Date Time Interrogation Session: 20221222074549
Implantable Lead Implant Date: 20170627
Implantable Lead Implant Date: 20170627
Implantable Lead Location: 753859
Implantable Lead Location: 753860
Implantable Lead Model: 377
Implantable Lead Model: 377
Implantable Lead Serial Number: 49436227
Implantable Lead Serial Number: 49471106
Implantable Pulse Generator Implant Date: 20170627
Pulse Gen Model: 394969
Pulse Gen Serial Number: 68786455

## 2021-07-02 MED ORDER — CEPHALEXIN 500 MG PO CAPS: 500.0000 mg | ORAL_CAPSULE | Freq: Two times a day (BID) | ORAL | 0 refills | Status: AC

## 2021-07-02 NOTE — Progress Notes (Signed)
I have spent 5 minutes in review of e-visit questionnaire, review and updating patient chart, medical decision making and response to patient.   Nolyn Eilert Cody Kelby Adell, PA-C    

## 2021-07-02 NOTE — Progress Notes (Signed)

## 2021-07-03 ENCOUNTER — Telehealth: Payer: Self-pay | Admitting: Family Medicine

## 2021-07-03 NOTE — Telephone Encounter (Signed)
Left message for patient to call back and schedule Medicare Annual Wellness Visit (AWV) in office.   If unable to come into the office for AWV,  please offer to do virtually or by telephone.  Last AWV: 07/09/2020  Please schedule at anytime with [facility].  40 minute appointment  Any questions, please contact me at [phone]

## 2021-07-10 NOTE — Progress Notes (Signed)
Remote pacemaker transmission.   

## 2021-07-13 DIAGNOSIS — F4323 Adjustment disorder with mixed anxiety and depressed mood: Secondary | ICD-10-CM | POA: Diagnosis not present

## 2021-07-14 DIAGNOSIS — G4733 Obstructive sleep apnea (adult) (pediatric): Secondary | ICD-10-CM | POA: Diagnosis not present

## 2021-07-30 ENCOUNTER — Telehealth: Payer: Self-pay | Admitting: Family Medicine

## 2021-07-30 NOTE — Telephone Encounter (Signed)
Copied from [place] [id number]. Topic: Medicare AWV >> Jul 30, 2021 11:45 AM Cher Nakai R wrote: Reason for CRM:  Left message for patient to call back and schedule Medicare Annual Wellness Visit (AWV) in office.   If unable to come into the office for AWV,  please offer to do virtually or by telephone.  Last AWV: 07/09/2020  Please schedule at anytime with [facility].  40 minute appointment  Any questions, please contact me at [phone]

## 2021-08-07 ENCOUNTER — Ambulatory Visit: Payer: PPO | Admitting: Family Medicine

## 2021-08-19 ENCOUNTER — Encounter: Payer: Self-pay | Admitting: Cardiology

## 2021-08-19 ENCOUNTER — Ambulatory Visit (INDEPENDENT_AMBULATORY_CARE_PROVIDER_SITE_OTHER): Payer: PPO | Admitting: Cardiology

## 2021-08-19 ENCOUNTER — Other Ambulatory Visit: Payer: Self-pay

## 2021-08-19 VITALS — BP 126/84 | HR 70 | Ht 64.0 in | Wt 279.0 lb

## 2021-08-19 DIAGNOSIS — Z8679 Personal history of other diseases of the circulatory system: Secondary | ICD-10-CM

## 2021-08-19 DIAGNOSIS — I6523 Occlusion and stenosis of bilateral carotid arteries: Secondary | ICD-10-CM | POA: Diagnosis not present

## 2021-08-19 DIAGNOSIS — I495 Sick sinus syndrome: Secondary | ICD-10-CM

## 2021-08-19 NOTE — Progress Notes (Signed)
Cardiology Office Note  Date: 08/19/2021   ID: [name], DOB [birth year], MRN [id number]  PCP:  Kathyrn Drown, MD  Cardiologist:  Rozann Lesches, MD Electrophysiologist:  Cristopher Peru, MD   Chief Complaint  Patient presents with   Cardiac follow-up    History of Present Illness: [name] is a 71 y.o. female last seen in August 2022 by Ms. Bonnell Public PA-C.  She is here for a follow-up visit.  States that she had COVID 19 during the winter months of last year, just now getting back to her baseline stamina.  She is going to the [facility], uses a step machine and also treadmill, usually 3 to 4 days a week.  I did talk with her about the wellness program through [facility] at the [facility] and we will plan to make a referral.  I went over her medications.  At this point she is not taking any regular antihypertensive medications, following blood pressure trend at home.  She is also following with Dr. Wolfgang Phoenix.  She has a Biotronik pacemaker in place with followed by Dr. Lovena Le.  Device interrogation in December 2022 revealed normal function.  She remains on aspirin and also Crestor, her last LDL was 25.  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 Lovena Le   Sleep apnea     Past Surgical History:  Procedure Laterality Date   ABDOMINAL HYSTERECTOMY     APPENDECTOMY     BREAST BIOPSY Left 2011   BREAST BIOPSY  2008   BREAST LUMPECTOMY Left 2008    CHOLECYSTECTOMY     COLONOSCOPY N/A 09/13/2013   Procedure: COLONOSCOPY;  Surgeon: [name], MD;  [facility];  Service: Endoscopy;  Laterality: N/A;  730   EP IMPLANTABLE DEVICE N/A 01/06/2016   Procedure: Pacemaker Implant;  Surgeon: [name], MD;  Location: [facility];  Service: Cardiovascular;  Laterality: N/A;   ESOPHAGOGASTRODUODENOSCOPY N/A 01/17/2014   Procedure: ESOPHAGOGASTRODUODENOSCOPY (EGD);  Surgeon: [name], MD;  [facility];  Service: Endoscopy;  Laterality: N/A;  245   RIGHT/LEFT HEART CATH AND CORONARY ANGIOGRAPHY N/A 01/01/2020   Procedure: RIGHT/LEFT HEART CATH AND CORONARY ANGIOGRAPHY;  Surgeon: [name], MD;  Location: [facility];  Service: Cardiovascular;  Laterality: N/A;   TOTAL KNEE ARTHROPLASTY Bilateral right knee   2012, 2007    Current Outpatient Medications  Medication Sig Dispense Refill   acetaminophen (TYLENOL) 650 MG CR tablet Take 650-1,300 mg by mouth See admin instructions. 1300MG  IN THE MORNING AND 650MG  AT BEDTIME     ALPRAZolam (XANAX XR) 1 MG 24 hr tablet Take 1 mg by mouth in the morning and at bedtime.     ALPRAZolam (XANAX) 1 MG tablet Take 0.5 mg by mouth daily as needed for anxiety.  aspirin 325 MG EC tablet Take 325 mg by mouth daily.     glucosamine-chondroitin 500-400 MG tablet Take 2 tablets by mouth every morning.      lamoTRIgine (LAMICTAL) 200 MG tablet Take 400 mg by mouth at bedtime.     Multiple Vitamin (MULTIVITAMIN) tablet Take 1 tablet by mouth daily.     QUEtiapine (SEROQUEL) 200 MG tablet Take 600 mg by mouth at bedtime.     rosuvastatin (CRESTOR) 10 MG tablet Take 1 tablet (10 mg total) by mouth daily. 90 tablet 3   Vitamin D, Ergocalciferol, (DRISDOL) 50000 units CAPS capsule Take 50,000 Units by mouth every Sunday.      No current facility-administered medications for this visit.   Allergies:  Imipramine and Tricor [fenofibrate]   ROS: Occasional  palpitations, nothing prolonged.  No syncope.  Physical Exam: VS:  BP 126/84    Pulse 70    Ht 5\' 4"  (1.626 m)    Wt 279 lb (126.6 kg)    SpO2 97%    BMI 47.89 kg/m , BMI Body mass index is 47.89 kg/m.  Wt Readings from Last 3 Encounters:  08/19/21 279 lb (126.6 kg)  06/09/21 273 lb 12.8 oz (124.2 kg)  04/28/21 275 lb (124.7 kg)    General: Patient appears comfortable at rest. HEENT: Conjunctiva and lids normal, wearing a mask. Neck: Supple, no elevated JVP or carotid bruits, no thyromegaly. Lungs: Clear to auscultation, nonlabored breathing at rest. Cardiac: Regular rate and rhythm, no S3 or significant systolic murmur, no pericardial rub. Extremities: No pitting edema.  ECG:  An ECG dated 01/26/2021 was personally reviewed today and demonstrated:  Atrial paced rhythm.  Recent Labwork: 01/26/2021: ALT 23; AST 30; BUN 21; Hemoglobin 13.2; Platelets 206; Potassium 4.8; Sodium 138 04/28/2021: Creatinine, Ser 0.79     Component Value Date/Time   CHOL 96 (L) 02/05/2021 0803   TRIG 188 (H) 02/05/2021 0803   HDL 41 02/05/2021 0803   CHOLHDL 2.3 02/05/2021 0803   CHOLHDL 5.0 02/06/2020 0859   VLDL 46 (H) 02/06/2020 0859   LDLCALC 25 02/05/2021 0803    Other Studies Reviewed Today:  Echocardiogram 12/05/2019:  1. Left ventricular ejection fraction, by estimation, is 65 to 70%. The  left ventricle has normal function. The left ventricle has no regional  wall motion abnormalities. There is mild left ventricular hypertrophy.  Left ventricular diastolic parameters  are consistent with Grade I diastolic dysfunction (impaired relaxation).   2. Right ventricular systolic function is normal. The right ventricular  size is normal. Tricuspid regurgitation signal is inadequate for assessing  PA pressure.   3. The mitral valve is grossly normal. Trivial mitral valve  regurgitation.   4. The aortic valve is tricuspid. Aortic valve regurgitation is not  visualized.   5. The inferior vena  cava is normal in size with greater than 50%  respiratory variability, suggesting right atrial pressure of 3 mmHg.   Cardiac catheterization 01/01/2020: Normal coronary arteries Normal filling pressures   Fick Cardiac Output 6.71 L/min  Fick Cardiac Output Index 2.97 (L/min)/BSA  RA A Wave 4 mmHg  RA V Wave 2 mmHg  RA Mean 1 mmHg  RV Systolic Pressure 30 mmHg  RV Diastolic Pressure 2 mmHg  RV EDP 5 mmHg  PA Systolic Pressure 34 mmHg  PA Diastolic Pressure 12 mmHg  PA Mean 20 mmHg  PW A Wave 9 mmHg  PW V Wave 9 mmHg  PW Mean 7 mmHg  AO Systolic Pressure 633 mmHg  AO Diastolic Pressure 74 mmHg  AO Mean 696 mmHg  LV Systolic Pressure 295 mmHg  LV Diastolic Pressure 6 mmHg  LV EDP 9 mmHg  AOp Systolic Pressure 284 mmHg  AOp Diastolic Pressure 75 mmHg  AOp Mean Pressure 132 mmHg  LVp Systolic Pressure 440 mmHg  LVp Diastolic Pressure 2 mmHg  LVp EDP Pressure 7 mmHg  QP/QS 1  TPVR Index 6.72 HRUI  TSVR Index 35.65 HRUI  PVR SVR Ratio 0.12  TPVR/TSVR Ratio 0.19    Assessment and Plan:  1.  Sinus node dysfunction with Biotronik pacemaker in place and follow-up with Dr. Lovena Le.  She is due for an office visit to see him, this will be arranged.  Last remote interrogation revealed normal device function.  2.  History of recurrent shortness of breath over the years.  She has had a reassuring cardiac work-up including echocardiogram and cardiac catheterization as outlined above.  Recommended continued regular exercise plan, referring to [facility] wellness plan as well.  3.  Carotid artery disease, less than 50% bilateral ICA stenosis by Dopplers in June 2022.  She is on aspirin and statin therapy.  4.  History of hypertension, currently following blood pressure trend, she discontinued Micardis due to low blood pressures.  Medication Adjustments/Labs and Tests Ordered: Current medicines are reviewed at length with the patient today.  Concerns regarding medicines are outlined above.    Tests Ordered: No orders of the defined types were placed in this encounter.   Medication Changes: No orders of the defined types were placed in this encounter.   Disposition:  Follow up  1 year.  Signed, [name], MD, [facility] 08/19/2021 1:37 PM    [facility] at [facility] [address], [place], [place] [address] Phone: [phone]; Fax: [phone]

## 2021-08-19 NOTE — Patient Instructions (Signed)
Medication Instructions:  Your physician recommends that you continue on your current medications as directed. Please refer to the Current Medication list given to you today.   Labwork: None today  Testing/Procedures: None today  Follow-Up: 1 year  Any Other Special Instructions Will Be Listed Below (If Applicable).  If you need a refill on your cardiac medications before your next appointment, please call your pharmacy.   Thank you for choosing [facility] !

## 2021-08-19 NOTE — Addendum Note (Signed)
Addended by: Barbarann Ehlers A on: 08/19/2021 03:03 PM   Modules accepted: Orders

## 2021-08-20 NOTE — Progress Notes (Signed)
Met pt at [facility] while she was exercising.  Explained program to pt.  Can do T/TH 2pm-315p at [facility], already a member Scheduled intake for 08/25/21 at 115pm.  We will bring her into the current class.

## 2021-08-22 DIAGNOSIS — G4733 Obstructive sleep apnea (adult) (pediatric): Secondary | ICD-10-CM | POA: Diagnosis not present

## 2021-08-24 ENCOUNTER — Telehealth: Payer: Self-pay

## 2021-08-24 NOTE — Telephone Encounter (Signed)
Text received from pt requesting call  Call to pt. Would prefer to start PREP in next [place] class which will be early May. Will recall her closer to start of class to do intake

## 2021-08-28 DIAGNOSIS — Z79899 Other long term (current) drug therapy: Secondary | ICD-10-CM | POA: Diagnosis not present

## 2021-08-28 DIAGNOSIS — E781 Pure hyperglyceridemia: Secondary | ICD-10-CM | POA: Diagnosis not present

## 2021-08-28 DIAGNOSIS — R7303 Prediabetes: Secondary | ICD-10-CM | POA: Diagnosis not present

## 2021-08-29 LAB — HEMOGLOBIN A1C
Est. average glucose Bld gHb Est-mCnc: 126 mg/dL
Hgb A1c MFr Bld: 6 % — ABNORMAL HIGH (ref 4.8–5.6)

## 2021-08-29 LAB — LIPID PANEL
Chol/HDL Ratio: 2.6 ratio (ref 0.0–4.4)
Cholesterol, Total: 110 mg/dL (ref 100–199)
HDL: 43 mg/dL (ref >39)
LDL Chol Calc (NIH): 43 mg/dL (ref 0–99)
Triglycerides: 138 mg/dL (ref 0–149)
VLDL Cholesterol Cal: 24 mg/dL (ref 5–40)

## 2021-08-29 LAB — BASIC METABOLIC PANEL
BUN/Creatinine Ratio: 20 (ref 12–28)
BUN: 18 mg/dL (ref 8–27)
CO2: 25 mmol/L (ref 20–29)
Calcium: 10 mg/dL (ref 8.7–10.3)
Chloride: 103 mmol/L (ref 96–106)
Creatinine, Ser: 0.9 mg/dL (ref 0.57–1.00)
Glucose: 125 mg/dL — ABNORMAL HIGH (ref 70–99)
Potassium: 4.4 mmol/L (ref 3.5–5.2)
Sodium: 143 mmol/L (ref 134–144)
eGFR: 70 mL/min/{1.73_m2} (ref >59)

## 2021-09-01 ENCOUNTER — Other Ambulatory Visit: Payer: Self-pay

## 2021-09-01 ENCOUNTER — Ambulatory Visit (INDEPENDENT_AMBULATORY_CARE_PROVIDER_SITE_OTHER): Payer: PPO | Admitting: Ophthalmology

## 2021-09-01 ENCOUNTER — Ambulatory Visit (INDEPENDENT_AMBULATORY_CARE_PROVIDER_SITE_OTHER): Payer: PPO

## 2021-09-01 ENCOUNTER — Ambulatory Visit (INDEPENDENT_AMBULATORY_CARE_PROVIDER_SITE_OTHER): Payer: PPO | Admitting: Family Medicine

## 2021-09-01 ENCOUNTER — Encounter (INDEPENDENT_AMBULATORY_CARE_PROVIDER_SITE_OTHER): Payer: Self-pay | Admitting: *Deleted

## 2021-09-01 ENCOUNTER — Encounter (INDEPENDENT_AMBULATORY_CARE_PROVIDER_SITE_OTHER): Payer: Self-pay | Admitting: Ophthalmology

## 2021-09-01 ENCOUNTER — Encounter: Payer: Self-pay | Admitting: Family Medicine

## 2021-09-01 VITALS — Ht 64.0 in | Wt 275.0 lb

## 2021-09-01 VITALS — BP 131/77 | HR 67 | Temp 96.8°F | Wt 275.6 lb

## 2021-09-01 DIAGNOSIS — H43821 Vitreomacular adhesion, right eye: Secondary | ICD-10-CM

## 2021-09-01 DIAGNOSIS — H35071 Retinal telangiectasis, right eye: Secondary | ICD-10-CM | POA: Diagnosis not present

## 2021-09-01 DIAGNOSIS — Z1211 Encounter for screening for malignant neoplasm of colon: Secondary | ICD-10-CM | POA: Diagnosis not present

## 2021-09-01 DIAGNOSIS — F3181 Bipolar II disorder: Secondary | ICD-10-CM | POA: Diagnosis not present

## 2021-09-01 DIAGNOSIS — Z0000 Encounter for general adult medical examination without abnormal findings: Secondary | ICD-10-CM | POA: Diagnosis not present

## 2021-09-01 DIAGNOSIS — G4733 Obstructive sleep apnea (adult) (pediatric): Secondary | ICD-10-CM

## 2021-09-01 DIAGNOSIS — I5032 Chronic diastolic (congestive) heart failure: Secondary | ICD-10-CM | POA: Diagnosis not present

## 2021-09-01 DIAGNOSIS — H34232 Retinal artery branch occlusion, left eye: Secondary | ICD-10-CM | POA: Diagnosis not present

## 2021-09-01 NOTE — Progress Notes (Signed)
Referral ordered in EPIC. 

## 2021-09-01 NOTE — Progress Notes (Signed)
Subjective:   [name] is a 71 y.o. female who presents for Medicare Annual (Subsequent) preventive examination.  Review of Systems     Cardiac Risk Factors include: advanced age (>51men, >44 women);hypertension;sedentary lifestyle;obesity (BMI >30kg/m2);Other (see comment), Risk factor comments: CHF     Objective:    Today's Vitals   09/01/21 0954  Weight: 275 lb (124.7 kg)  Height: 5\' 4"  (1.626 m)   Body mass index is 47.2 kg/m.  Advanced Directives 09/01/2021 03/18/2021 01/26/2021 12/31/2019 12/31/2019 12/05/2019 12/04/2019  Does Patient Have a Medical Advance Directive? Yes Yes Yes Yes Yes Yes No  Type of Paramedic of [place];Living will [place];Living will Living will [place];Living will - [place] -  Does patient want to make changes to medical advance directive? - No - Patient declined No - Patient declined No - Patient declined - No - Patient declined -  Copy of [place] in Chart? No - copy requested No - copy requested - No - copy requested - No - copy requested -  Would patient like information on creating a medical advance directive? - - Yes (ED - Information included in AVS);No - Patient declined - Yes (ED - Information included in AVS) - -  Pre-existing out of facility DNR order (yellow form or pink MOST form) - - - - - - -    Current Medications (verified) Outpatient Encounter Medications as of 09/01/2021  Medication Sig   acetaminophen (TYLENOL) 650 MG CR tablet Take 650-1,300 mg by mouth See admin instructions. 1300MG  IN THE MORNING AND 650MG  AT BEDTIME   ALPRAZolam (XANAX XR) 1 MG 24 hr tablet Take 1 mg by mouth in the morning and at bedtime.   ALPRAZolam (XANAX) 1 MG tablet Take 0.5 mg by mouth daily as needed for anxiety.    aspirin 325 MG EC tablet Take 325 mg by mouth daily.   Cholecalciferol (VITAMIN D3) 1.25 MG (50000 UT) CAPS Take 1 capsule by mouth  once a week.   glucosamine-chondroitin 500-400 MG tablet Take 2 tablets by mouth every morning.    lamoTRIgine (LAMICTAL) 200 MG tablet Take 400 mg by mouth at bedtime.   Lutein 20 MG CAPS    Multiple Vitamin (MULTIVITAMIN) tablet Take 1 tablet by mouth daily.   QUEtiapine (SEROQUEL) 200 MG tablet Take 600 mg by mouth at bedtime.   rosuvastatin (CRESTOR) 10 MG tablet Take 1 tablet (10 mg total) by mouth daily.   telmisartan (MICARDIS) 20 MG tablet Take by mouth. (Patient not taking: Reported on 09/01/2021)   [DISCONTINUED] Vitamin D, Ergocalciferol, (DRISDOL) 50000 units CAPS capsule Take 50,000 Units by mouth every Sunday.    No facility-administered encounter medications on file as of 09/01/2021.    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 Lovena Le   Sleep apnea    Past Surgical History:  Procedure Laterality Date   ABDOMINAL HYSTERECTOMY     APPENDECTOMY     BREAST BIOPSY Left 2011   BREAST BIOPSY  2008   BREAST LUMPECTOMY Left 2008   CHOLECYSTECTOMY     COLONOSCOPY N/A 09/13/2013   Procedure: COLONOSCOPY;  Surgeon: [name], MD;  [facility];  Service: Endoscopy;  Laterality: N/A;  730   EP IMPLANTABLE DEVICE N/A 01/06/2016   Procedure: Pacemaker Implant;  Surgeon: [name], MD;  Location: [facility];  Service: Cardiovascular;  Laterality: N/A;    ESOPHAGOGASTRODUODENOSCOPY N/A 01/17/2014   Procedure: ESOPHAGOGASTRODUODENOSCOPY (EGD);  Surgeon: [name], MD;  [facility];  Service: Endoscopy;  Laterality: N/A;  245   RIGHT/LEFT HEART CATH AND CORONARY ANGIOGRAPHY N/A 01/01/2020   Procedure: RIGHT/LEFT HEART CATH AND CORONARY ANGIOGRAPHY;  Surgeon: [name], MD;  Location: [facility];  Service: Cardiovascular;  Laterality: N/A;   TOTAL KNEE ARTHROPLASTY Bilateral right knee   2012, 2007   Family History  Problem Relation Age of Onset   Bipolar disorder Mother    Diabetes Mother    Bipolar disorder Sister    Diabetes Sister    Alcohol abuse Father    Hypertension Father    Heart disease Other    Social History   Socioeconomic History   Marital status: Married    Spouse name: Not on file   Number of children: 3   Years of education: college   Highest education level: Associate degree: occupational, Hotel manager, or vocational program  Occupational History   Occupation: Therapist, sports  Tobacco Use   Smoking status: Never   Smokeless tobacco: Never  Vaping Use   Vaping Use: Never used  Substance and Sexual Activity   Alcohol use: No    Alcohol/week: 0.0 standard drinks   Drug use: No   Sexual activity: Not Currently  Other Topics Concern   Not on file  Social History Narrative   04/24/2013 [name] was born and grew up in [place]. She reports that her childhood was "tough." She has 2 older sisters and a younger brother. She achieved an Associates Degree in nursing. 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 She has 2 daughters and one son. She denies any legal difficulties. She affiliates as Engineer, manufacturing. Her hobbies include reading, sewing, crafts, and cooking. She reports that her social support system consists of her friend and passed her. 04/24/2013 [name]      Lives alone.   Right-handed.   No caffeine use.   Social Determinants of  Health   Financial Resource Strain: Low Risk    Difficulty of Paying Living Expenses: Not hard at all  Food Insecurity: No Food Insecurity   Worried About Charity fundraiser in the Last Year: Never true   [place] in the Last Year: Never true  Transportation Needs: No Transportation Needs   Lack of Transportation (Medical): No   Lack of Transportation (Non-Medical): No  Physical Activity: Sufficiently Active   Days of Exercise per Week: 4 days   Minutes of Exercise per Session: 50 min  Stress: No Stress Concern Present   Feeling of Stress : Only a little  Social Connections: Engineer, building services of Communication with Friends and  Family: More than three times a week   Frequency of Social Gatherings with Friends and Family: More than three times a week   Attends Religious Services: More than 4 times per year   Active Member of Clubs or Organizations: Yes   Attends Music therapist: More than 4 times per year   Marital Status: Married    Tobacco Counseling Counseling given: Not Answered   Clinical Intake:  Pre-visit preparation completed: Yes  Pain : No/denies pain     BMI - recorded: 47.2 Nutritional Status: BMI > 30  Obese Nutritional Risks: None Diabetes: No  How often do you need to have someone help you when you read instructions, pamphlets, or other written materials from your doctor or pharmacy?: 1 - Never  Diabetic?No  Interpreter Needed?: No  Information entered by :: mj Burton Gahan, lpn   Activities of Daily Living In your present state of health, do you have any difficulty performing the following activities: 09/01/2021  Hearing? N  Vision? N  Difficulty concentrating or making decisions? (No Data)  Comment Pt declined.  Walking or climbing stairs? N  Dressing or bathing? N  Doing errands, shopping? N  Preparing Food and eating ? N  Using the Toilet? N  In the past six months, have you accidently leaked urine? N  Do you  have problems with loss of bowel control? N  Managing your Medications? N  Managing your Finances? N  Housekeeping or managing your Housekeeping? N  Some recent data might be hidden    Patient Care Team: Kathyrn Drown, MD as PCP - General (Family Medicine) [name], MD as PCP - Electrophysiology (Cardiology) [name], MD as PCP - Cardiology (Cardiology) Chucky May, MD as Consulting Physician (Psychiatry)  Indicate any recent Medical Services you may have received from other than [facility] providers in the past year (date may be approximate).     Assessment:   This is a routine wellness examination for Emmamarie.  Hearing/Vision screen Hearing Screening - Comments:: No hearing issues.  Vision Screening - Comments:: Dr. Raynald Blend. Glasses. 2022.  Dietary issues and exercise activities discussed: Current Exercise Habits: Home exercise routine;Structured exercise class, Type of exercise: walking;Other - see comments;treadmill (Stepper), Time (Minutes): 50, Frequency (Times/Week): 4, Weekly Exercise (Minutes/Week): 200, Intensity: Mild, Exercise limited by: cardiac condition(s)   Goals Addressed             This Visit's Progress    Exercise 3x per week (30 min per time)       Continue to exercise. Do some volunteer work.        Depression Screen PHQ 2/9 Scores 09/01/2021 04/28/2021 03/18/2021 02/05/2021 12/04/2020 07/09/2020 06/19/2018  PHQ - 2 Score 0 0 1 2 0 1 2  PHQ- 9 Score - - - 7 - 6 5    Fall Risk Fall Risk  09/01/2021 04/28/2021 03/18/2021 03/09/2021 12/04/2020  Falls in the past year? 0 0 0 0 0  Number falls in past yr: 0 0 - 0 0  Injury with Fall? 0 0 - 0 0  Risk for fall due to : No Fall Risks No Fall Risks - No Fall Risks No Fall Risks  Follow up Falls prevention discussed Falls evaluation completed - Falls evaluation completed Falls evaluation completed    FALL RISK PREVENTION PERTAINING TO THE HOME:  Any stairs in or around the home? Yes   If so, are there any without handrails? No  Home free of loose throw  rugs in walkways, pet beds, electrical cords, etc? Yes  Adequate lighting in your home to reduce risk of falls? Yes   ASSISTIVE DEVICES UTILIZED TO PREVENT FALLS:  Life alert? No  Use of a cane, walker or w/c? No  Grab bars in the bathroom? Yes  Shower chair or bench in shower? No  Elevated toilet seat or a handicapped toilet? No   TIMED UP AND GO:  Was the test performed? Yes .  Length of time to ambulate 10 feet: 10 sec.   Gait steady and fast without use of assistive device  Cognitive Function: Normal cognitive status assessed by direct observation by this Nurse Health Advisor. No abnormalities found.   MMSE - Mini Mental State Exam 12/12/2017  Orientation to time 5  Orientation to Place 5  Registration 3  Attention/ Calculation 5  Recall 2  Language- name 2 objects 2  Language- repeat 1  Language- follow 3 step command 3  Language- read & follow direction 1  Write a sentence 1  Copy design 1  Total score 29        Immunizations Immunization History  Administered Date(s) Administered   Influenza, High Dose Seasonal PF 05/19/2018   Influenza,inj,Quad PF,6+ Mos 05/30/2017, 06/09/2021   Influenza-Unspecified 07/21/2016, 06/10/2017, 05/19/2018, 08/08/2020   Moderna Sars-Covid-2 Vaccination 08/16/2019, 09/14/2019, 07/14/2020, 06/08/2021   Pneumococcal Conjugate-13 06/19/2018   Pneumococcal Polysaccharide-23 01/15/2020   Td 08/01/2013   Tdap 10/10/2017    TDAP status: Up to date  Flu Vaccine status: Up to date  Pneumococcal vaccine status: Up to date  Covid-19 vaccine status: Completed vaccines  Qualifies for Shingles Vaccine? Yes   Zostavax completed No   Shingrix Completed?: No.    Education has been provided regarding the importance of this vaccine. Patient has been advised to call insurance company to determine out of pocket expense if they have not yet received this vaccine. Advised may  also receive vaccine at local pharmacy or Health Dept. Verbalized acceptance and understanding.  Screening Tests Health Maintenance  Topic Date Due   Zoster Vaccines- Shingrix (1 of 2) Never done   COLONOSCOPY (Pts 45-72yrs Insurance coverage will need to be confirmed)  09/13/2020   COVID-19 Vaccine (5 - Booster for Moderna series) 08/03/2021   MAMMOGRAM  03/28/2023   TETANUS/TDAP  10/11/2027   Pneumonia Vaccine 39+ Years old  Completed   INFLUENZA VACCINE  Completed   DEXA SCAN  Completed   Hepatitis C Screening  Completed   HPV VACCINES  Aged Out    Health Maintenance  Health Maintenance Due  Topic Date Due   Zoster Vaccines- Shingrix (1 of 2) Never done   COLONOSCOPY (Pts 45-49yrs Insurance coverage will need to be confirmed)  09/13/2020   COVID-19 Vaccine (5 - Booster for Moderna series) 08/03/2021    Colorectal cancer screening: Type of screening: Colonoscopy. Completed 09/14/2015. Repeat every 7 years  Mammogram status: Completed 03/27/2021. Repeat every year  Bone Density status: Completed 03/21/2020. Results reflect: Bone density results: NORMAL. Repeat every 2 years.  Lung Cancer Screening: (Low Dose CT Chest recommended if Age 64-80 years, 30 pack-year currently smoking OR have quit w/in 15years.) does not qualify.   Additional Screening:  Hepatitis C Screening: does qualify; Completed 09/05/2017  Vision Screening: Recommended annual ophthalmology exams for early detection of glaucoma and other disorders of the eye. Is the patient up to date with their annual eye exam?  Yes  Who is the provider or what is the name of the office  in which the patient attends annual eye exams? Dr. Zadie Rhine in [place] If pt is not established with a provider, would they like to be referred to a provider to establish care? No .   Dental Screening: Recommended annual dental exams for proper oral hygiene  Community Resource Referral / Chronic Care Management: CRR required this visit?  No    CCM required this visit?  No      Plan:     I have personally reviewed and noted the following in the patients chart:   Medical and social history Use of alcohol, tobacco or illicit drugs  Current medications and supplements including opioid prescriptions.  Functional ability and status Nutritional status Physical activity Advanced directives List of other physicians Hospitalizations, surgeries, and ER visits in previous 12 months Vitals Screenings to include cognitive, depression, and falls Referrals and appointments  In addition, I have reviewed and discussed with patient certain preventive protocols, quality metrics, and best practice recommendations. A written personalized care plan for preventive services as well as general preventive health recommendations were provided to patient.     Chriss Driver, LPN   [id number]   Nurse Notes: Seen by Dr. Wolfgang Phoenix prior to visit with wellness nurse. Dr. Wolfgang Phoenix discussed Shingrix with patient and ordered Colonoscopy. Pt declined 6CIT screen.

## 2021-09-01 NOTE — Addendum Note (Signed)
Addended by: Dairl Ponder on: 09/01/2021 02:50 PM   Modules accepted: Orders

## 2021-09-01 NOTE — Progress Notes (Signed)
09/01/2021     CHIEF COMPLAINT Patient presents for  Chief Complaint  Patient presents with   Branch Retinal Vein Occlusion      HISTORY OF PRESENT ILLNESS: [name] is a 71 y.o. female who presents to the clinic today for:   HPI   4 mos fu ou oct fp. Pt states "the outside of my right eye is blurred. When I am reading I can't see out of the right eye clearly, it is like a smudge." Pt states she is not taking blood pressure medicine, (telmisartan) Micardis.  Last edited by Laurin Coder on 09/01/2021  1:04 PM.      Referring physician: Kathyrn Drown, MD [address] [place],  [place] [address]  HISTORICAL INFORMATION:   Selected notes from the MEDICAL RECORD NUMBER    Lab Results  Component Value Date   HGBA1C 6.0 (H) 08/28/2021     CURRENT MEDICATIONS: No current outpatient medications on file. (Ophthalmic Drugs)   No current facility-administered medications for this visit. (Ophthalmic Drugs)   Current Outpatient Medications (Other)  Medication Sig   acetaminophen (TYLENOL) 650 MG CR tablet Take 650-1,300 mg by mouth See admin instructions. 1300MG IN THE MORNING AND 650MG AT BEDTIME   ALPRAZolam (XANAX XR) 1 MG 24 hr tablet Take 1 mg by mouth in the morning and at bedtime.   ALPRAZolam (XANAX) 1 MG tablet Take 0.5 mg by mouth daily as needed for anxiety.    aspirin 325 MG EC tablet Take 325 mg by mouth daily.   Cholecalciferol (VITAMIN D3) 1.25 MG (50000 UT) CAPS Take 1 capsule by mouth once a week.   glucosamine-chondroitin 500-400 MG tablet Take 2 tablets by mouth every morning.    lamoTRIgine (LAMICTAL) 200 MG tablet Take 400 mg by mouth at bedtime.   Lutein 20 MG CAPS    Multiple Vitamin (MULTIVITAMIN) tablet Take 1 tablet by mouth daily.   QUEtiapine (SEROQUEL) 200 MG tablet Take 600 mg by mouth at bedtime.   rosuvastatin (CRESTOR) 10 MG tablet Take 1 tablet (10 mg total) by mouth daily.   telmisartan (MICARDIS) 20 MG tablet Take by  mouth. (Patient not taking: Reported on 09/01/2021)   No current facility-administered medications for this visit. 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 Lovena Le   Sleep apnea    Past Surgical History:  Procedure Laterality Date   ABDOMINAL HYSTERECTOMY     APPENDECTOMY     BREAST BIOPSY Left 2011   BREAST BIOPSY  2008   BREAST LUMPECTOMY Left 2008   CHOLECYSTECTOMY  COLONOSCOPY N/A 09/13/2013   Procedure: COLONOSCOPY;  Surgeon: [name], MD;  [facility];  Service: Endoscopy;  Laterality: N/A;  730   EP IMPLANTABLE DEVICE N/A 01/06/2016   Procedure: Pacemaker Implant;  Surgeon: [name], MD;  Location: [facility];  Service: Cardiovascular;  Laterality: N/A;   ESOPHAGOGASTRODUODENOSCOPY N/A 01/17/2014   Procedure: ESOPHAGOGASTRODUODENOSCOPY (EGD);  Surgeon: [name], MD;  [facility];  Service: Endoscopy;  Laterality: N/A;  245   RIGHT/LEFT HEART CATH AND CORONARY ANGIOGRAPHY N/A 01/01/2020   Procedure: RIGHT/LEFT HEART CATH AND CORONARY ANGIOGRAPHY;  Surgeon: [name], MD;  Location: [facility];  Service: Cardiovascular;  Laterality: N/A;   TOTAL KNEE ARTHROPLASTY Bilateral  right knee   2012, 2007    FAMILY HISTORY Family History  Problem Relation Age of Onset   Bipolar disorder Mother    Diabetes Mother    Bipolar disorder Sister    Diabetes Sister    Alcohol abuse Father    Hypertension Father    Heart disease Other     SOCIAL HISTORY Social History   Tobacco Use   Smoking status: Never   Smokeless tobacco: Never  Vaping Use   Vaping Use: Never used  Substance Use Topics   Alcohol use: No    Alcohol/week: 0.0 standard drinks   Drug use: No         OPHTHALMIC EXAM:  Base Eye Exam     Visual Acuity (ETDRS)       Right Left   Dist cc 20/25 -1 20/30         Tonometry (Tonopen, 1:03 PM)       Right Left   Pressure 12 12         Pupils       Pupils Dark Light APD   Right PERRL 5 4 None   Left PERRL 5 4 None         Extraocular Movement       Right Left    Full Full         Neuro/Psych     Oriented x3: Yes   Mood/Affect: Normal         Dilation     Both eyes: 1.0% Mydriacyl, 2.5% Phenylephrine @ 1:03 PM           Slit Lamp and Fundus Exam     External Exam       Right Left   External Normal Normal         Slit Lamp Exam       Right Left   Lids/Lashes Normal Normal   Conjunctiva/Sclera White and quiet White and quiet   Cornea Clear Clear   Anterior Chamber Deep and quiet Deep and quiet   Iris Round and reactive Round and reactive   Lens Centered posterior chamber intraocular lens Centered posterior chamber intraocular lens, 1+ Posterior capsular opacification   Anterior Vitreous Normal Normal         Fundus Exam       Right Left   Posterior Vitreous Normal, clear vitreous, no PVD Normal, no PVD   Disc Normal Normal   C/D Ratio 0.1 0.2   Macula 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, Soft drusen, pigment over drusen INF,  no macular thickening, no membrane, no hemorrhage 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 Scan locations included subfoveal. Central Foveal Thickness: 333. Findings include abnormal foveal contour, retinal drusen , subretinal fluid.   Left Eye Quality was good. Scan locations included subfoveal. Central Foveal Thickness: 309. Progression has been stable. Findings include retinal drusen , no IRF, no SRF, vitreomacular adhesion .   Notes Much less intraretinal fluid inferior to the foveal zone as compared to OCT's dated 08/12/2020 and 08/26/2020, coincident with an improved overall central field thickness, from baseline of 221 m to now 175 m, yet now stabilized with continued vitreous attachment to the macular region, see the OCT.  Type I macular hole with elevation of the fovea from VMA yet with good acuity, and slightly less overall with use of consistently of CPAP.  We will continue to monitor and look for spontaneous resolution of the vitreomacular lesion  Now on CPAP consistent use for the last 7 months     Color Fundus Photography Optos - OU - Both Eyes       Right Eye Progression has been stable. Disc findings include normal observations. Macula : retinal pigment epithelium abnormalities, drusen. Vessels : normal observations. Periphery : normal observations.   Left Eye Progression has been stable. Disc findings include normal observations. Macula : drusen, retinal pigment epithelium abnormalities. Periphery : normal observations.   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  Normal optic nerves normal periphery normal media  OS with Cholesterol plaque intraluminal superior to the optic nerve at the second-order branching of the superior branch retinal artery, prior cotton-wool spot resolved             ASSESSMENT/PLAN:  Vitreomacular adhesion of right eye Likely some role in the formation of this drusenoid subfoveal deposit which is overall improved however with use of CPAP.  We will continue to monitor as the last 4 months this area has only stabilized with not much in the way of further resolution  Type 2 macular telangiectasis, right Patient continues on CPAP use onset of use was  Obstructive sleep apnea of adult Patient continues on CPAP use now     ICD-10-CM   1. BRAO (branch retinal artery occlusion), left  H34.232 OCT, Retina - OU - Both Eyes    Color Fundus Photography Optos - OU - Both Eyes    2. Vitreomacular adhesion of right eye  H43.821     3. Type 2 macular telangiectasis, right  H35.071     4. Obstructive sleep apnea of adult  G47.33       1.  OU overall intermediate ARMD stable, no signs of wet AMD 2.  Type II MAC-TEL likely OD, with serous elevation of the foveal region and drusenoid deposit centrally but some role of VMA may still be ongoing.  3.  Patient continues on excellent CPAP use now  Ophthalmic Meds Ordered this visit:  No orders of the defined types were placed in this  encounter.      Return in about 4 months (around 12/30/2021) for DILATE OU, OCT.  There are no Patient Instructions on file for this visit.   Explained the diagnoses, plan, and follow up with the patient and they expressed understanding.  Patient expressed understanding of the importance of proper follow up care.   Clent Demark Panhia Karl M.D. Diseases & Surgery of the Retina and Vitreous [facility] 09/01/21     Abbreviations: M myopia (nearsighted); A astigmatism;  H hyperopia (farsighted); P presbyopia; Mrx spectacle prescription;  CTL contact lenses; OD right eye; OS left eye; OU both eyes  XT exotropia; ET esotropia; PEK punctate epithelial keratitis; PEE punctate epithelial erosions; DES dry eye syndrome; MGD meibomian gland dysfunction; ATs artificial tears; PFAT's preservative free artificial tears; Bernard nuclear sclerotic cataract; PSC posterior subcapsular cataract; ERM epi-retinal membrane; PVD posterior vitreous detachment; RD retinal detachment; DM diabetes mellitus; DR diabetic retinopathy; NPDR non-proliferative diabetic retinopathy; PDR proliferative diabetic retinopathy; CSME clinically significant macular edema; DME diabetic macular edema; dbh dot blot hemorrhages; CWS cotton wool spot; POAG primary open angle glaucoma; C/D cup-to-disc ratio; HVF humphrey visual field; GVF goldmann visual field; OCT optical coherence tomography; IOP intraocular pressure; BRVO Branch retinal vein occlusion; CRVO central retinal vein occlusion; CRAO central retinal artery occlusion; BRAO branch retinal artery occlusion; RT retinal tear; SB scleral buckle; PPV pars plana vitrectomy; VH Vitreous hemorrhage; PRP panretinal laser photocoagulation; IVK intravitreal kenalog; VMT vitreomacular traction; MH Macular hole;  NVD neovascularization of the disc; NVE neovascularization elsewhere; AREDS age related eye disease study; ARMD age related macular degeneration; POAG primary open angle glaucoma; EBMD epithelial/anterior basement membrane dystrophy; ACIOL anterior chamber intraocular lens; IOL intraocular lens; PCIOL posterior chamber intraocular lens; Phaco/IOL phacoemulsification with intraocular lens placement; [place] photorefractive keratectomy; LASIK laser assisted in situ keratomileusis; HTN hypertension; DM diabetes mellitus; COPD chronic obstructive pulmonary disease

## 2021-09-01 NOTE — Patient Instructions (Signed)
[name] , Thank you for taking time to come for your Medicare Wellness Visit. I appreciate your ongoing commitment to your health goals. Please review the following plan we discussed and let me know if I can assist you in the future.   Screening recommendations/referrals: Colonoscopy: Done 09/13/2013, ordered today by Dr. Wolfgang Phoenix. Mammogram: Done 03/27/2021 Repeat annually  Bone Density: Done 03/21/2020 Repeat every 2 years  Recommended yearly ophthalmology/optometry visit for glaucoma screening and checkup Recommended yearly dental visit for hygiene and checkup  Vaccinations: Influenza vaccine: Done 08/08/2020 Repeat annually  Pneumococcal vaccine: Done 06/19/2018 and 01/15/2020 Tdap vaccine: Done 10/10/2017 Repeat in 10 years  Shingles vaccine: Discussed.   Covid-19:Done 08/16/2019, 09/14/2019, 07/14/2020 and 06/08/2021.  Advanced directives: Please bring a copy of your health care power of attorney and living will to the office to be added to your chart at your convenience.   Conditions/risks identified: KEEP UP THE GOOD WORK!!  Next appointment: Follow up in one year for your annual wellness visit 2024.   Preventive Care 1 Years and Older, Female Preventive care refers to lifestyle choices and visits with your health care provider that can promote health and wellness. What does preventive care include? A yearly physical exam. This is also called an annual well check. Dental exams once or twice a year. Routine eye exams. Ask your health care provider how often you should have your eyes checked. Personal lifestyle choices, including: Daily care of your teeth and gums. Regular physical activity. Eating a healthy diet. Avoiding tobacco and drug use. Limiting alcohol use. Practicing safe sex. Taking low-dose aspirin every day. Taking vitamin and mineral supplements as recommended by your health care provider. What happens during an annual well check? The services and screenings done  by your health care provider during your annual well check will depend on your age, overall health, lifestyle risk factors, and family history of disease. Counseling  Your health care provider may ask you questions about your: Alcohol use. Tobacco use. Drug use. Emotional well-being. Home and relationship well-being. Sexual activity. Eating habits. History of falls. Memory and ability to understand (cognition). Work and work Statistician. Reproductive health. Screening  You may have the following tests or measurements: Height, weight, and BMI. Blood pressure. Lipid and cholesterol levels. These may be checked every 5 years, or more frequently if you are over 35 years old. Skin check. Lung cancer screening. You may have this screening every year starting at age 73 if you have a 30-pack-year history of smoking and currently smoke or have quit within the past 15 years. Fecal occult blood test (FOBT) of the stool. You may have this test every year starting at age 2. Flexible sigmoidoscopy or colonoscopy. You may have a sigmoidoscopy every 5 years or a colonoscopy every 10 years starting at age 31. Hepatitis C blood test. Hepatitis B blood test. Sexually transmitted disease (STD) testing. Diabetes screening. This is done by checking your blood sugar (glucose) after you have not eaten for a while (fasting). You may have this done every 1-3 years. Bone density scan. This is done to screen for osteoporosis. You may have this done starting at age 41. Mammogram. This may be done every 1-2 years. Talk to your health care provider about how often you should have regular mammograms. Talk with your health care provider about your test results, treatment options, and if necessary, the need for more tests. Vaccines  Your health care provider may recommend certain vaccines, such as: Influenza vaccine. This is  recommended every year. Tetanus, diphtheria, and acellular pertussis (Tdap, Td) vaccine. You  may need a Td booster every 10 years. Zoster vaccine. You may need this after age 20. Pneumococcal 13-valent conjugate (PCV13) vaccine. One dose is recommended after age 59. Pneumococcal polysaccharide (PPSV23) vaccine. One dose is recommended after age 81. Talk to your health care provider about which screenings and vaccines you need and how often you need them. This information is not intended to replace advice given to you by your health care provider. Make sure you discuss any questions you have with your health care provider. Document Released: 07/25/2015 Document Revised: 03/17/2016 Document Reviewed: 04/29/2015 Elsevier Interactive Patient Education  2017 [place] Prevention in the Home Falls can cause injuries. They can happen to people of all ages. There are many things you can do to make your home safe and to help prevent falls. What can I do on the outside of my home? Regularly fix the edges of walkways and driveways and fix any cracks. Remove anything that might make you trip as you walk through a door, such as a raised step or threshold. Trim any bushes or trees on the path to your home. Use bright outdoor lighting. Clear any walking paths of anything that might make someone trip, such as rocks or tools. Regularly check to see if handrails are loose or broken. Make sure that both sides of any steps have handrails. Any raised decks and porches should have guardrails on the edges. Have any leaves, snow, or ice cleared regularly. Use sand or salt on walking paths during winter. Clean up any spills in your garage right away. This includes oil or grease spills. What can I do in the bathroom? Use night lights. Install grab bars by the toilet and in the tub and shower. Do not use towel bars as grab bars. Use non-skid mats or decals in the tub or shower. If you need to sit down in the shower, use a plastic, non-slip stool. Keep the floor dry. Clean up any water that spills  on the floor as soon as it happens. Remove soap buildup in the tub or shower regularly. Attach bath mats securely with double-sided non-slip rug tape. Do not have throw rugs and other things on the floor that can make you trip. What can I do in the bedroom? Use night lights. Make sure that you have a light by your bed that is easy to reach. Do not use any sheets or blankets that are too big for your bed. They should not hang down onto the floor. Have a firm chair that has side arms. You can use this for support while you get dressed. Do not have throw rugs and other things on the floor that can make you trip. What can I do in the kitchen? Clean up any spills right away. Avoid walking on wet floors. Keep items that you use a lot in easy-to-reach places. If you need to reach something above you, use a strong step stool that has a grab bar. Keep electrical cords out of the way. Do not use floor polish or wax that makes floors slippery. If you must use wax, use non-skid floor wax. Do not have throw rugs and other things on the floor that can make you trip. What can I do with my stairs? Do not leave any items on the stairs. Make sure that there are handrails on both sides of the stairs and use them. Fix handrails that are  broken or loose. Make sure that handrails are as long as the stairways. Check any carpeting to make sure that it is firmly attached to the stairs. Fix any carpet that is loose or worn. Avoid having throw rugs at the top or bottom of the stairs. If you do have throw rugs, attach them to the floor with carpet tape. Make sure that you have a light switch at the top of the stairs and the bottom of the stairs. If you do not have them, ask someone to add them for you. What else can I do to help prevent falls? Wear shoes that: Do not have high heels. Have rubber bottoms. Are comfortable and fit you well. Are closed at the toe. Do not wear sandals. If you use a stepladder: Make  sure that it is fully opened. Do not climb a closed stepladder. Make sure that both sides of the stepladder are locked into place. Ask someone to hold it for you, if possible. Clearly mark and make sure that you can see: Any grab bars or handrails. First and last steps. Where the edge of each step is. Use tools that help you move around (mobility aids) if they are needed. These include: Canes. Walkers. Scooters. Crutches. Turn on the lights when you go into a dark area. Replace any light bulbs as soon as they burn out. Set up your furniture so you have a clear path. Avoid moving your furniture around. If any of your floors are uneven, fix them. If there are any pets around you, be aware of where they are. Review your medicines with your doctor. Some medicines can make you feel dizzy. This can increase your chance of falling. Ask your doctor what other things that you can do to help prevent falls. This information is not intended to replace advice given to you by your health care provider. Make sure you discuss any questions you have with your health care provider. Document Released: 04/24/2009 Document Revised: 12/04/2015 Document Reviewed: 08/02/2014 Elsevier Interactive Patient Education  2017 [employer].

## 2021-09-01 NOTE — Assessment & Plan Note (Signed)
Patient continues on CPAP use onset of use was

## 2021-09-01 NOTE — Patient Instructions (Signed)

## 2021-09-01 NOTE — Progress Notes (Addendum)
° °  Subjective:    Patient ID: [name], female    DOB: 1950/09/14, 71 y.o.   MRN: [id number] This verifies that the history review of any tests, physical exam, assessment and plan were conducted by Dr. Sallee [name] and documented accordingly by him today Andrea Lange MD primary care [facility]  HPI Pt here for follow up. Pt states she is better than when she was last here and her shortness of breath has improved. Pt is taking 1 mg XR Xanax and 0.5 mg PRN. Taking all meds as directed.   Patient is improving gradually from her [name] Denies any major setbacks States she saw the cardiologist they felt things were going relatively well Chronic diastolic CHF (congestive heart failure) ([name]), Chronic  Bipolar 2 disorder, major depressive episode ([name]), Chronic  Morbid obesity ([name]), Chronic She follows with cardiologist regular basis they feel things are stable She does see her mental health specialist on a regular basis everything doing well Review of Systems     Objective:   Physical Exam  General-in no acute distress Eyes-no discharge Lungs-respiratory rate normal, CTA CV-no murmurs,RRR Extremities skin warm dry no edema Neuro grossly normal Behavior normal, alert       Assessment & Plan:  colonoscopy 1. Chronic diastolic CHF (congestive heart failure) (HCC) Stable we will follow through cardiology at least yearly  2. Bipolar 2 disorder, major depressive episode ([name]) Stable follow with specialist on a regular basis  3. Morbid obesity ([name]) Healthy diet regular activity recommended  Colonoscopy recommended Shingles vaccine recommended  Follow-up in 5 to 6 months

## 2021-09-01 NOTE — Assessment & Plan Note (Signed)
Likely some role in the formation of this drusenoid subfoveal deposit which is overall improved however with use of CPAP.  We will continue to monitor as the last 4 months this area has only stabilized with not much in the way of further resolution

## 2021-09-01 NOTE — Assessment & Plan Note (Signed)
Patient continues on CPAP use now

## 2021-09-07 ENCOUNTER — Ambulatory Visit: Payer: PPO | Admitting: Family Medicine

## 2021-09-10 ENCOUNTER — Ambulatory Visit (INDEPENDENT_AMBULATORY_CARE_PROVIDER_SITE_OTHER): Payer: PPO | Admitting: Ophthalmology

## 2021-09-10 ENCOUNTER — Other Ambulatory Visit: Payer: Self-pay

## 2021-09-10 ENCOUNTER — Encounter (INDEPENDENT_AMBULATORY_CARE_PROVIDER_SITE_OTHER): Payer: Self-pay | Admitting: Ophthalmology

## 2021-09-10 DIAGNOSIS — G4733 Obstructive sleep apnea (adult) (pediatric): Secondary | ICD-10-CM

## 2021-09-10 DIAGNOSIS — H353132 Nonexudative age-related macular degeneration, bilateral, intermediate dry stage: Secondary | ICD-10-CM

## 2021-09-10 DIAGNOSIS — H43821 Vitreomacular adhesion, right eye: Secondary | ICD-10-CM

## 2021-09-10 NOTE — Assessment & Plan Note (Signed)
Patient confirms she still compliant with CPAP use for a year ?

## 2021-09-10 NOTE — Assessment & Plan Note (Signed)
OU, stable overall, no sign of CNVM ? ?Subfoveal drusenoid pigment epithelial detachment type change continues to be smaller as patient coincidentally has used CPAP over the last 1 year.  The absence of flattening of the fovea, not likely to be vitreomacular adhesion or traction related that we sometimes see with tangential traction ?

## 2021-09-10 NOTE — Progress Notes (Signed)
09/10/2021     CHIEF COMPLAINT Patient presents for  Chief Complaint  Patient presents with   Macular Degeneration      HISTORY OF PRESENT ILLNESS: [name] is a 71 y.o. female who presents to the clinic today for:   HPI   Patient reports having poor understanding of what her current issues are with the macular degeneration and blood was at seeking explanation.  No interval change since her last visit no new onset visual acuity distortions or changes Last edited by Hurman Horn, MD on 09/10/2021  2:32 PM.      Referring physician: Kathyrn Drown, MD [address] [place],  [place] [address]  HISTORICAL INFORMATION:   Selected notes from the MEDICAL RECORD NUMBER    Lab Results  Component Value Date   HGBA1C 6.0 (H) 08/28/2021     CURRENT MEDICATIONS: No current outpatient medications on file. (Ophthalmic Drugs)   No current facility-administered medications for this visit. (Ophthalmic Drugs)   Current Outpatient Medications (Other)  Medication Sig   acetaminophen (TYLENOL) 650 MG CR tablet Take 650-1,300 mg by mouth See admin instructions. 1300MG IN THE MORNING AND 650MG AT BEDTIME   ALPRAZolam (XANAX XR) 1 MG 24 hr tablet Take 1 mg by mouth in the morning and at bedtime.   ALPRAZolam (XANAX) 1 MG tablet Take 0.5 mg by mouth daily as needed for anxiety.    aspirin 325 MG EC tablet Take 325 mg by mouth daily.   Cholecalciferol (VITAMIN D3) 1.25 MG (50000 UT) CAPS Take 1 capsule by mouth once a week.   glucosamine-chondroitin 500-400 MG tablet Take 2 tablets by mouth every morning.    lamoTRIgine (LAMICTAL) 200 MG tablet Take 400 mg by mouth at bedtime.   Lutein 20 MG CAPS    Multiple Vitamin (MULTIVITAMIN) tablet Take 1 tablet by mouth daily.   QUEtiapine (SEROQUEL) 200 MG tablet Take 600 mg by mouth at bedtime.   rosuvastatin (CRESTOR) 10 MG tablet Take 1 tablet (10 mg total) by mouth daily.   telmisartan (MICARDIS) 20 MG tablet Take by mouth.  (Patient not taking: Reported on 09/01/2021)   No current facility-administered medications for this visit. (Other)      REVIEW OF SYSTEMS: ROS   Negative for: Constitutional, Gastrointestinal, Neurological, Skin, Genitourinary, Musculoskeletal, HENT, Endocrine, Cardiovascular, Eyes, Respiratory, Psychiatric, Allergic/Imm, Heme/Lymph Last edited by Hurman Horn, MD on 09/10/2021  2:32 PM.       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 Lovena Le   Sleep apnea    Past Surgical History:  Procedure Laterality Date   ABDOMINAL HYSTERECTOMY     APPENDECTOMY  BREAST BIOPSY Left 2011   BREAST BIOPSY  2008   BREAST LUMPECTOMY Left 2008   CHOLECYSTECTOMY     COLONOSCOPY N/A 09/13/2013   Procedure: COLONOSCOPY;  Surgeon: [name], MD;  [facility];  Service: Endoscopy;  Laterality: N/A;  730   EP IMPLANTABLE DEVICE N/A 01/06/2016   Procedure: Pacemaker Implant;  Surgeon: [name], MD;  Location: [facility];  Service: Cardiovascular;  Laterality: N/A;   ESOPHAGOGASTRODUODENOSCOPY N/A 01/17/2014   Procedure: ESOPHAGOGASTRODUODENOSCOPY (EGD);  Surgeon: [name], MD;  [facility];  Service: Endoscopy;  Laterality: N/A;  245   RIGHT/LEFT HEART CATH AND  CORONARY ANGIOGRAPHY N/A 01/01/2020   Procedure: RIGHT/LEFT HEART CATH AND CORONARY ANGIOGRAPHY;  Surgeon: [name], MD;  Location: [facility];  Service: Cardiovascular;  Laterality: N/A;   TOTAL KNEE ARTHROPLASTY Bilateral right knee   2012, 2007    FAMILY HISTORY Family History  Problem Relation Age of Onset   Bipolar disorder Mother    Diabetes Mother    Bipolar disorder Sister    Diabetes Sister    Alcohol abuse Father    Hypertension Father    Heart disease Other     SOCIAL HISTORY Social History   Tobacco Use   Smoking status: Never   Smokeless tobacco: Never  Vaping Use   Vaping Use: Never used  Substance Use Topics   Alcohol use: No    Alcohol/week: 0.0 standard drinks   Drug use: No         OPHTHALMIC EXAM:  Base Eye Exam     Visual Acuity (ETDRS)       Right Left   Dist cc 20/25 20/30 +2    Correction: Glasses         Neuro/Psych     Oriented x3: Yes   Mood/Affect: Normal           Slit Lamp and Fundus Exam     External Exam       Right Left   External Normal Normal         Slit Lamp Exam       Right Left   Lids/Lashes Normal Normal   Conjunctiva/Sclera White and quiet White and quiet   Cornea Clear Clear   Anterior Chamber Deep and quiet Deep and quiet   Iris Round and reactive Round and reactive   Lens Centered posterior chamber intraocular lens Centered posterior chamber intraocular lens, 1+ Posterior capsular opacification   Anterior Vitreous Normal Normal         Fundus Exam       Right Left   Posterior Vitreous clear vitreous, no PVD no PVD   C/D Ratio 0.1 0.2            IMAGING AND PROCEDURES  Imaging and Procedures for 09/10/21           ASSESSMENT/PLAN:  Obstructive sleep apnea of adult Patient confirms she still compliant with CPAP use for a year  Vitreomacular adhesion of right eye Vitreomacular traction may cause vision loss from anatomic distortion to the center  of the vision, the macula.  If visual function is symptomatic or threatened, therapy may be needed.  Surgical intervention offers the highest chance of visual stability and improvement.  Distortion of the macula anatomy may cause splitting of the retinal layers, termed foveomacular retinoschisis, which can cause more permanent vision loss.  Epiretinal membranes may also be associated.  Macular hole may  also develop if vitreomacular traction progresses. The minor form of this condition is Vitreomacular adhesion, which is a natural change in the aging process of the eye, which requires observation only.  I referred the patient to the Internet to look at the vitreous in the retinal interface and how the natural vitreous is attached to the retina as were youngsters and as we get older the natural vitreous will will in many cases let go to what is called a vitreous detachment or posterior vitreous detachment usually accompanied by floaters.  This is happened in either of her eyes and have explained to her that the traction can cause elevation of the macular region that could be playing a role in her right eye however it is not likely a major component of her disorder because her right eye is getting better coincident with CPAP use over the last 1 year that was that was undertaken at my suggestion  Intermediate stage nonexudative age-related macular degeneration of both eyes OU, stable overall, no sign of CNVM  Subfoveal drusenoid pigment epithelial detachment type change continues to be smaller as patient coincidentally has used CPAP over the last 1 year.  The absence of flattening of the fovea, not likely to be vitreomacular adhesion or traction related that we sometimes see with tangential traction     ICD-10-CM   1. Obstructive sleep apnea of adult  G47.33     2. Vitreomacular adhesion of right eye  H43.821     3. Intermediate stage nonexudative age-related macular degeneration of both eyes  H35.3132        1.  Lengthy explanation was given regarding the condition of MAC-TEL which has been mentioned in a condition that I have concerns for her because of the previously untreated sleep apnea but less so now that she is on proper therapy that is CPAP to prevent nightly hypoxic and hypertensive spikes through the macular region without treatment.  2.  Similar case of MAC-TEL discussed gust and reviewed with the patient guarding the nightly use of CPAP.  3.  Ophthalmic Meds Ordered this visit:  No orders of the defined types were placed in this encounter.      Return for As scheduled, DILATE OU, OCT.  There are no Patient Instructions on file for this visit.   Explained the diagnoses, plan, and follow up with the patient and they expressed understanding.  Patient expressed understanding of the importance of proper follow up care.   Clent Demark Guerline Happ M.D. Diseases & Surgery of the Retina and Vitreous [facility] [place] 09/10/21     Abbreviations: M myopia (nearsighted); A astigmatism; H hyperopia (farsighted); P presbyopia; Mrx spectacle prescription;  CTL contact lenses; OD right eye; OS left eye; OU both eyes  XT exotropia; ET esotropia; PEK punctate epithelial keratitis; PEE punctate epithelial erosions; DES dry eye syndrome; MGD meibomian gland dysfunction; ATs artificial tears; PFAT's preservative free artificial tears; [place] nuclear sclerotic cataract; PSC posterior subcapsular cataract; ERM epi-retinal membrane; PVD posterior vitreous detachment; RD retinal detachment; DM diabetes mellitus; DR diabetic retinopathy; NPDR non-proliferative diabetic retinopathy; PDR proliferative diabetic retinopathy; CSME clinically significant macular edema; DME diabetic macular edema; dbh dot blot hemorrhages; CWS cotton wool spot; POAG primary open angle glaucoma; C/D cup-to-disc ratio; HVF humphrey visual field; GVF goldmann visual field; OCT optical coherence tomography; IOP intraocular  pressure; BRVO Branch retinal vein occlusion; CRVO central retinal vein occlusion; CRAO central retinal artery occlusion; BRAO branch retinal artery occlusion; RT retinal tear; SB scleral  buckle; PPV pars plana vitrectomy; VH Vitreous hemorrhage; PRP panretinal laser photocoagulation; IVK intravitreal kenalog; VMT vitreomacular traction; MH Macular hole;  NVD neovascularization of the disc; NVE neovascularization elsewhere; AREDS age related eye disease study; ARMD age related macular degeneration; POAG primary open angle glaucoma; EBMD epithelial/anterior basement membrane dystrophy; ACIOL anterior chamber intraocular lens; IOL intraocular lens; PCIOL posterior chamber intraocular lens; Phaco/IOL phacoemulsification with intraocular lens placement; [place] photorefractive keratectomy; LASIK laser assisted in situ keratomileusis; HTN hypertension; DM diabetes mellitus; COPD chronic obstructive pulmonary disease

## 2021-09-10 NOTE — Assessment & Plan Note (Signed)
Vitreomacular traction may cause vision loss from anatomic distortion to the center of the vision, the macula.  If visual function is symptomatic or threatened, therapy may be needed.  Surgical intervention offers the highest chance of visual stability and improvement.  Distortion of the macula anatomy may cause splitting of the retinal layers, termed foveomacular retinoschisis, which can cause more permanent vision loss.  Epiretinal membranes may also be associated.  Macular hole may also develop if vitreomacular traction progresses. ?The minor form of this condition is Vitreomacular adhesion, which is a natural change in the aging process of the eye, which requires observation only. ? ?I referred the patient to the Internet to look at the vitreous in the retinal interface and how the natural vitreous is attached to the retina as were youngsters and as we get older the natural vitreous will will in many cases let go to what is called a vitreous detachment or posterior vitreous detachment usually accompanied by floaters.  This is happened in either of her eyes and have explained to her that the traction can cause elevation of the macular region that could be playing a role in her right eye however it is not likely a major component of her disorder because her right eye is getting better coincident with CPAP use over the last 1 year that was that was undertaken at my suggestion ?

## 2021-09-22 ENCOUNTER — Encounter: Payer: Self-pay | Admitting: Internal Medicine

## 2021-09-22 ENCOUNTER — Ambulatory Visit: Payer: PPO | Admitting: Internal Medicine

## 2021-09-22 VITALS — BP 120/66 | HR 68 | Ht 64.0 in | Wt 281.0 lb

## 2021-09-22 DIAGNOSIS — I495 Sick sinus syndrome: Secondary | ICD-10-CM | POA: Diagnosis not present

## 2021-09-22 NOTE — Patient Instructions (Signed)
Medication Instructions:  Your physician recommends that you continue on your current medications as directed. Please refer to the Current Medication list given to you today.  *If you need a refill on your cardiac medications before your next appointment, please call your pharmacy*   Lab Work: NONE   If you have labs (blood work) drawn today and your tests are completely normal, you will receive your results only by: . MyChart Message (if you have MyChart) OR . A paper copy in the mail If you have any lab test that is abnormal or we need to change your treatment, we will call you to review the results.   Testing/Procedures: NONE    Follow-Up: At [facility], you and your health needs are our priority.  As part of our continuing mission to provide you with exceptional heart care, we have created designated Provider Care Teams.  These Care Teams include your primary Cardiologist (physician) and Advanced Practice Providers (APPs -  Physician Assistants and Nurse Practitioners) who all work together to provide you with the care you need, when you need it.  We recommend signing up for the patient portal called "MyChart".  Sign up information is provided on this After Visit Summary.  MyChart is used to connect with patients for Virtual Visits (Telemedicine).  Patients are able to view lab/test results, encounter notes, upcoming appointments, etc.  Non-urgent messages can be sent to your provider as well.   To learn more about what you can do with MyChart, go to [email or web address].    Your next appointment:   1 year(s)  The format for your next appointment:   In Person  Provider:   Gregg Taylor, MD   Other Instructions Thank you for choosing [facility]!    

## 2021-09-22 NOTE — Progress Notes (Signed)
? ? ? ? ?HPI ?[name] returns today for follow-up.  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  She has had occasional palpitations but for the most part her symptoms have been controlled.  She has class II dyspnea which is multifactorial.  She denies chest pain.  She has not had syncope. Her weight is up 8 more pounds. ?Allergies  ?Allergen Reactions  ? Imipramine Hives and Rash  ? Tricor [Fenofibrate] Other (See Comments)  ?  Pain, "aching"  ? ? ? ?Current Outpatient Medications  ?Medication Sig Dispense Refill  ? acetaminophen (TYLENOL) 650 MG CR tablet Take 650-1,300 mg by mouth See admin instructions. '1300MG'$  IN THE MORNING AND '650MG'$  AT BEDTIME    ? ALPRAZolam (XANAX XR) 1 MG 24 hr tablet Take 1 mg by mouth in the morning and at bedtime.    ? ALPRAZolam (XANAX) 1 MG tablet Take 0.5 mg by mouth daily as needed for anxiety.     ? aspirin 325 MG EC tablet Take 325 mg by mouth daily.    ? Cholecalciferol (VITAMIN D3) 1.25 MG (50000 UT) CAPS Take 1 capsule by mouth once a week.    ? glucosamine-chondroitin 500-400 MG tablet Take 2 tablets by mouth every morning.     ? lamoTRIgine (LAMICTAL) 200 MG tablet Take 400 mg by mouth at bedtime.    ? Lutein 20 MG CAPS     ? Multiple Vitamin (MULTIVITAMIN) tablet Take 1 tablet by mouth daily.    ? QUEtiapine (SEROQUEL) 200 MG tablet Take 600 mg by mouth at bedtime.    ? rosuvastatin (CRESTOR) 10 MG tablet Take 1 tablet (10 mg total) by mouth daily. 90 tablet 3  ? telmisartan (MICARDIS) 20 MG tablet Take by mouth. (Patient not taking: Reported on 09/22/2021)    ? ?No current facility-administered medications for this visit.  ? ? ? ?Past Medical History:  ?Diagnosis Date  ? Anxiety   ? Bipolar 1 disorder ([place])   ? Breast cancer ([place])   ? Breast cancer, left breast ([place]) 07/22/2011  ? Central artery  occlusion of retina 11/2020  ? OS  ? CHF (congestive heart failure) ([place])   ? Depression   ? Essential hypertension   ? Hyperlipidemia   ? IBS (irritable bowel syndrome)   ? Memory loss   ? OA (osteoarthritis)   ? Obesity   ? Personal history of chemotherapy 2009  ? Personal history of radiation therapy 2009  ? Polycystic ovarian disease   ? Prediabetes   ? Sinus node dysfunction ([place]) 12/2015  ? Biotronik pacemaker - Dr. Lovena Le  ? Sleep apnea   ? ? ?ROS: ? ? All systems reviewed and negative except as noted in the HPI. ? ? ?Past Surgical History:  ?Procedure Laterality Date  ? ABDOMINAL HYSTERECTOMY    ? APPENDECTOMY    ? BREAST BIOPSY Left 2011  ? BREAST BIOPSY  2008  ? BREAST LUMPECTOMY Left 2008  ? CHOLECYSTECTOMY    ? COLONOSCOPY N/A 09/13/2013  ? Procedure: COLONOSCOPY;  Surgeon: [name], MD;  [facility];  Service: Endoscopy;  Laterality: N/A;  730  ? EP IMPLANTABLE DEVICE N/A 01/06/2016  ? Procedure: Pacemaker Implant;  Surgeon: [name], MD;  Location: [facility];  Service: Cardiovascular;  Laterality: N/A;  ? ESOPHAGOGASTRODUODENOSCOPY N/A 01/17/2014  ? Procedure: ESOPHAGOGASTRODUODENOSCOPY (  EGD);  Surgeon: [name], MD;  [facility];  Service: Endoscopy;  Laterality: N/A;  245  ? RIGHT/LEFT HEART CATH AND CORONARY ANGIOGRAPHY N/A 01/01/2020  ? Procedure: RIGHT/LEFT HEART CATH AND CORONARY ANGIOGRAPHY;  Surgeon: [name], MD;  Location: [facility];  Service: Cardiovascular;  Laterality: N/A;  ? TOTAL KNEE ARTHROPLASTY Bilateral right knee  ? 2012, 2007  ? ? ? ?Family History  ?Problem Relation Age of Onset  ? Bipolar disorder Mother   ? Diabetes Mother   ? Bipolar disorder Sister   ? Diabetes Sister   ? Alcohol abuse Father   ? Hypertension Father   ? Heart disease Other   ? ? ? ?Social History  ? ?Socioeconomic History  ? Marital status: Married  ?  Spouse name: Not on file  ? Number of children: 3  ? Years of education: college  ?  Highest education level: Associate degree: occupational, Hotel manager, or vocational program  ?Occupational History  ? Occupation: Therapist, sports  ?Tobacco Use  ? Smoking status: Never  ? Smokeless tobacco: Never  ?Vaping Use  ? Vaping Use: Never used  ?Substance and Sexual Activity  ? Alcohol use: No  ?  Alcohol/week: 0.0 standard drinks  ? Drug use: No  ? Sexual activity: Not Currently  ?Other Topics Concern  ? Not on file  ?Social History Narrative  ? 04/24/2013 [name] was born and grew up in [place]. She reports that her childhood was "tough." She has 2 older sisters and a younger brother. She achieved an Associates Degree in nursing. 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 She has 2 daughters and one son. She denies any legal difficulties. She affiliates as Engineer, manufacturing. Her hobbies include reading, sewing, crafts, and cooking. She reports that her social support system consists of her friend and passed her. 04/24/2013 [name]  ?   ? Lives alone.  ? Right-handed.  ? No caffeine use.  ? ?Social Determinants of Health  ? ?Financial Resource Strain: Low Risk   ? Difficulty of Paying Living Expenses: Not hard at all  ?Food Insecurity: No Food Insecurity  ? Worried About Charity fundraiser in the Last Year: Never true  ? Ran Out of Food in the Last Year: Never true  ?Transportation Needs: No Transportation Needs  ? Lack of Transportation (Medical): No  ? Lack of Transportation (Non-Medical): No  ?Physical Activity: Sufficiently Active  ? Days of Exercise per Week: 4 days  ? Minutes of Exercise per Session: 50 min  ?Stress: No Stress Concern Present  ? Feeling of Stress : Only a little  ?Social Connections: Socially Integrated  ? Frequency of Communication with Friends and Family: More than three times a week  ? Frequency of Social Gatherings with Friends and Family: More than three times a week  ? Attends Religious Services: More than 4 times per year  ? Active Member of  Clubs or Organizations: Yes  ? Attends Archivist Meetings: More than 4 times per year  ? Marital Status: Married  ?Intimate Partner Violence: Not At Risk  ? Fear of Current or Ex-Partner: No  ? Emotionally Abused: No  ? Physically Abused: No  ? Sexually Abused: No  ? ? ? ?BP 120/66   Pulse 68   Ht '5\' 4"'$  (1.626 m)   Wt 281 lb (127.5 kg)   SpO2 97%   BMI  48.23 kg/m?  ? ?Physical Exam: ? ?Well appearing NAD ?HEENT: Unremarkable ?Neck:  No JVD, no thyromegally ?Lymphatics:  No adenopathy ?Back:  No CVA tenderness ?Lungs:  Clear ?HEART:  Regular rate rhythm, no murmurs, no rubs, no clicks ?Abd:  soft, positive bowel sounds, no organomegally, no rebound, no guarding ?Ext:  2 plus pulses, no edema, no cyanosis, no clubbing ?Skin:  No rashes no nodules ?Neuro:  CN II through XII intact, motor grossly intact ? ?DEVICE  ?Normal device function.  See PaceArt for details.  ? ?Assess/Plan:  ?1. Obesity - she has gained 8lbs. She is encouraged to eat less and increase her activity. We discussed consuming more protein and eating fewer carbs. ?2. Diastolic heart failure - I encouraged her to avoid salty foods and to lose weight. ?3. Sinus node dysfunction - she is asymptomatic, s/p PPM insertion. ?4. PPM - her Biotronik DDD PM is working normally. ?  ?Carleene Overlie Mistee Soliman,MD ?

## 2021-09-29 DIAGNOSIS — F4323 Adjustment disorder with mixed anxiety and depressed mood: Secondary | ICD-10-CM | POA: Diagnosis not present

## 2021-10-01 ENCOUNTER — Ambulatory Visit (INDEPENDENT_AMBULATORY_CARE_PROVIDER_SITE_OTHER): Payer: PPO

## 2021-10-01 DIAGNOSIS — I495 Sick sinus syndrome: Secondary | ICD-10-CM | POA: Diagnosis not present

## 2021-10-01 LAB — CUP PACEART REMOTE DEVICE CHECK
Date Time Interrogation Session: 20230323064631
Implantable Lead Implant Date: 20170627
Implantable Lead Implant Date: 20170627
Implantable Lead Location: 753859
Implantable Lead Location: 753860
Implantable Lead Model: 377
Implantable Lead Model: 377
Implantable Lead Serial Number: 49436227
Implantable Lead Serial Number: 49471106
Implantable Pulse Generator Implant Date: 20170627
Pulse Gen Model: 394969
Pulse Gen Serial Number: 68786455

## 2021-10-07 NOTE — Progress Notes (Signed)
Remote pacemaker transmission.   

## 2021-10-10 ENCOUNTER — Other Ambulatory Visit: Payer: Self-pay | Admitting: Cardiology

## 2021-10-30 DIAGNOSIS — F4323 Adjustment disorder with mixed anxiety and depressed mood: Secondary | ICD-10-CM | POA: Diagnosis not present

## 2021-11-06 ENCOUNTER — Telehealth: Payer: PPO | Admitting: Physician Assistant

## 2021-11-06 DIAGNOSIS — R3989 Other symptoms and signs involving the genitourinary system: Secondary | ICD-10-CM

## 2021-11-06 MED ORDER — SULFAMETHOXAZOLE-TRIMETHOPRIM 800-160 MG PO TABS: 1.0000 | ORAL_TABLET | Freq: Two times a day (BID) | ORAL | 0 refills | Status: DC

## 2021-11-06 NOTE — Progress Notes (Signed)

## 2021-11-13 ENCOUNTER — Telehealth: Payer: Self-pay

## 2021-11-13 NOTE — Telephone Encounter (Signed)
Call to pt to confirm start of PREP 11/24/21 at [facility] ?Pt agreeable ?Intake scheduled for 11/17/21 at 1pm at [facility]  ?Will meet pt in lobby  ?

## 2021-11-17 NOTE — Progress Notes (Signed)
[facility] PRE[facility] Evaluation ? ?Patient Details  ?Name: [name] ?MRN: [id number] ?Date of Birth: 1950-09-14 ?Age: 71 y.o. ?PCP: Kathyrn Drown, MD ? ?Vitals:  ? 11/17/21 1300  ?BP: 122/64  ?Pulse: 62  ?SpO2: 94%  ?Weight: 278 lb 6.4 oz (126.3 kg)  ? ? ? [facility] - 11/17/21 1600   ? ?  ? [facility]  ? [facility] [place]   ?  ? Referral   ? Referring Provider Domenic Polite   ? Reason for referral Heart Failure;Inactivity;Obesitity/Overweight;Stroke;Other   OSA  ?  ? Measurement  ? Waist Circumference 54 inches   ? Hip Circumference 59.5 inches   ? Body fat --   not measured 2/2 pacer  ?  ? Information for Trainer  ? Goals Lose wt, 20 lbs for 12 wks, move around easier, get out of chairs easier   ? Current Exercise uses stepper at Y, tries 3 x per week   ? Orthopedic Concerns Bil TKRs, dec ROM in left   ? 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 Current Barriers MD appts for husband   ? Restrictions/Precautions --   no falls in last 6 months  ? Medications that affect exercise Medication causing dizziness/drowsiness   ?  ? Timed Up and Go (TUGS)  ? Timed Up and Go Moderate risk 10-12 seconds   slow to rise from chair  ?  ? Mobility and Daily Activities  ? I find it easy to walk up or down two or more flights of stairs. 1   ? I have no trouble taking out the trash. 4   ? I do housework such as vacuuming and dusting on my own without difficulty. 4   ? I can easily lift a gallon of milk (8lbs). 4   ? I can easily walk a mile. 4   ? I have no trouble reaching into high cupboards or reaching down to pick up something from the floor. 2   ? I do not have trouble doing out-door work such as Armed forces logistics/support/administrative officer, raking leaves, or gardening. 4   ?  ? Mobility and Daily Activities  ? I feel younger than my age. 1   ? I feel independent. 2   ? I feel energetic. 2   ? I live an active life.  2   ? I feel strong. 1   ? I feel healthy. 1   ? I feel active as other people my  age. 1   ?  ? How fit and strong are you.  ? Fit and Strong Total Score 33   ? ?  ?  ? ?  ? ?Past Medical History:  ?Diagnosis Date  ? Anxiety   ? Bipolar 1 disorder ([place])   ? Breast cancer ([place])   ? Breast cancer, left breast ([place]) 07/22/2011  ? 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 OS  ? CHF (congestive heart failure) ([place])   ? Depression   ? Essential hypertension   ? Hyperlipidemia   ? IBS (irritable bowel syndrome)   ? Memory loss   ? OA (osteoarthritis)   ? Obesity   ? Personal history of chemotherapy 2009  ? Personal history of radiation therapy 2009  ? Polycystic ovarian disease   ? Prediabetes   ? Sinus node dysfunction ([place]) 12/2015  ? Biotronik pacemaker - Dr. Lovena Le  ? Sleep apnea   ? ?Past  Surgical History:  ?Procedure Laterality Date  ? ABDOMINAL HYSTERECTOMY    ? APPENDECTOMY    ? BREAST BIOPSY Left 2011  ? BREAST BIOPSY  2008  ? BREAST LUMPECTOMY Left 2008  ? CHOLECYSTECTOMY    ? COLONOSCOPY N/A 09/13/2013  ? Procedure: COLONOSCOPY;  Surgeon: [name], MD;  [facility];  Service: Endoscopy;  Laterality: N/A;  730  ? EP IMPLANTABLE DEVICE N/A 01/06/2016  ? Procedure: Pacemaker Implant;  Surgeon: [name], MD;  Location: [facility];  Service: Cardiovascular;  Laterality: N/A;  ? ESOPHAGOGASTRODUODENOSCOPY N/A 01/17/2014  ? Procedure: ESOPHAGOGASTRODUODENOSCOPY (EGD);  Surgeon: [name], MD;  [facility];  Service: Endoscopy;  Laterality: N/A;  245  ? RIGHT/LEFT HEART CATH AND CORONARY ANGIOGRAPHY N/A 01/01/2020  ? Procedure: RIGHT/LEFT HEART CATH AND CORONARY ANGIOGRAPHY;  Surgeon: [name], MD;  Location: [facility];  Service: Cardiovascular;  Laterality: N/A;  ? TOTAL KNEE ARTHROPLASTY Bilateral right knee  ? 2012, 2007  ? ?Social History  ? ?Tobacco Use  ?Smoking Status Never  ?Smokeless Tobacco Never  ?Per pt, hgb A1C is 6. Ideally we would also reduce this by the end of 12 wks.  ? ?[name] ?11/17/2021, 4:48 PM ? ? ?

## 2021-11-23 DIAGNOSIS — G4733 Obstructive sleep apnea (adult) (pediatric): Secondary | ICD-10-CM | POA: Diagnosis not present

## 2021-11-24 NOTE — Progress Notes (Signed)
[facility] PREP Weekly Session ? ?Patient Details  ?Name: [name] ?MRN: [id number] ?Date of Birth: 1951-06-28 ?Age: 71 y.o. ?PCP: Kathyrn Drown, MD ? ?Vitals:  ? 11/24/21 1400  ?Weight: 279 lb (126.6 kg)  ? ? ? [facility] Weekly seesion - 11/24/21 1700   ? ?  ? [facility] "PREP" Location  ? [facility]PREP[facility] Location [place]   ?  ? Weekly Session  ? Topic Discussed Goal setting and welcome to the program   scale of perceived exertion  ? Classes attended to date 1   ? ?  ?  ? ?  ? ? ?Barnett Hatter ?11/24/2021, 5:04 PM ? ? ?

## 2021-12-03 NOTE — Progress Notes (Signed)
[facility] PREP Weekly Session  Patient Details  Name: [name] MRN: [id number] Date of Birth: 10-18-1950 Age: 71 y.o. PCP: Kathyrn Drown, MD  Vitals:   12/01/21 1400  Weight: 274 lb (124.3 kg)     [facility] Weekly seesion - 12/03/21 1700       [facility] "PREP" Location   [facility]      Weekly Session   Topic Discussed Importance of resistance training;Other ways to be active    Minutes exercised this week 215 minutes    Classes attended to date [place] 12/03/2021, 5:01 PM

## 2021-12-08 NOTE — Progress Notes (Signed)
[facility] PREP Weekly Session  Patient Details  Name: [name] MRN: [id number] Date of Birth: Jun 22, 1951 Age: 71 y.o. PCP: Kathyrn Drown, MD  Vitals:   12/08/21 1400  Weight: 273 lb (123.8 kg)     [facility] Weekly seesion - 12/08/21 1600       [facility] "PREP" Location   [facility]      Weekly Session   Topic Discussed Healthy eating tips    Minutes exercised this week 200 minutes    Classes attended to date [place] 12/08/2021, 4:24 PM

## 2021-12-15 NOTE — Progress Notes (Signed)
[facility] PREP Weekly Session  Patient Details  Name: [name] MRN: [id number] Date of Birth: 1951-01-28 Age: 71 y.o. PCP: Kathyrn Drown, MD  Vitals:   12/15/21 1648  Weight: 282 lb 12.8 oz (128.3 kg)     [facility] Weekly seesion - 12/15/21 1600       [facility] "PREP" Location   [facility] "PREP" Location [place]      Weekly Session   Topic Discussed Health habits    Minutes exercised this week 205 minutes    Classes attended to date [place] 12/15/2021, 4:49 PM

## 2021-12-22 NOTE — Progress Notes (Signed)
[facility] PREP Weekly Session  Patient Details  Name: [name] MRN: [id number] Date of Birth: 10-May-1951 Age: 71 y.o. PCP: Kathyrn Drown, MD  Vitals:   12/22/21 1400  Weight: 269 lb (122 kg)     [facility] Weekly seesion - 12/22/21 1600       [facility] "PREP" Location   [facility] "PREP" Location [place]      Weekly Session   Topic Discussed Restaurant Eating    Minutes exercised this week 360 minutes    Classes attended to date 9           Salt and sugar demo  [place] 12/22/2021, 4:04 PM

## 2021-12-29 ENCOUNTER — Encounter (INDEPENDENT_AMBULATORY_CARE_PROVIDER_SITE_OTHER): Payer: Self-pay

## 2021-12-29 DIAGNOSIS — H34232 Retinal artery branch occlusion, left eye: Secondary | ICD-10-CM | POA: Diagnosis not present

## 2021-12-29 DIAGNOSIS — H26492 Other secondary cataract, left eye: Secondary | ICD-10-CM | POA: Diagnosis not present

## 2021-12-29 DIAGNOSIS — Z961 Presence of intraocular lens: Secondary | ICD-10-CM | POA: Diagnosis not present

## 2021-12-29 DIAGNOSIS — H353131 Nonexudative age-related macular degeneration, bilateral, early dry stage: Secondary | ICD-10-CM | POA: Diagnosis not present

## 2021-12-29 NOTE — Progress Notes (Signed)
[facility] PREP Weekly Session  Patient Details  Name: [name] MRN: [id number] Date of Birth: [birth year] Age: 71 y.o. PCP: Kathyrn Drown, MD  Vitals:   12/29/21 1625  Weight: 269 lb 3.2 oz (122.1 kg)     [facility] Weekly seesion - 12/29/21 1600       [facility] "PREP" Location   [facility]      Weekly Session   Topic Discussed Stress management and problem solving   Progressive relaxation exercise   Minutes exercised this week 310 minutes    Classes attended to date [place] 12/29/2021, 4:26 PM

## 2021-12-30 ENCOUNTER — Ambulatory Visit (INDEPENDENT_AMBULATORY_CARE_PROVIDER_SITE_OTHER): Payer: PPO | Admitting: Ophthalmology

## 2021-12-30 ENCOUNTER — Encounter (INDEPENDENT_AMBULATORY_CARE_PROVIDER_SITE_OTHER): Payer: Self-pay | Admitting: Ophthalmology

## 2021-12-30 ENCOUNTER — Encounter (INDEPENDENT_AMBULATORY_CARE_PROVIDER_SITE_OTHER): Payer: PPO | Admitting: Ophthalmology

## 2021-12-30 DIAGNOSIS — H33101 Unspecified retinoschisis, right eye: Secondary | ICD-10-CM | POA: Diagnosis not present

## 2021-12-30 DIAGNOSIS — H35071 Retinal telangiectasis, right eye: Secondary | ICD-10-CM | POA: Diagnosis not present

## 2021-12-30 DIAGNOSIS — H43821 Vitreomacular adhesion, right eye: Secondary | ICD-10-CM

## 2021-12-30 DIAGNOSIS — H353132 Nonexudative age-related macular degeneration, bilateral, intermediate dry stage: Secondary | ICD-10-CM

## 2021-12-30 DIAGNOSIS — G4733 Obstructive sleep apnea (adult) (pediatric): Secondary | ICD-10-CM

## 2021-12-30 DIAGNOSIS — H34232 Retinal artery branch occlusion, left eye: Secondary | ICD-10-CM | POA: Diagnosis not present

## 2021-12-30 LAB — CUP PACEART REMOTE DEVICE CHECK
Battery Remaining Percentage: 60 %
Brady Statistic RA Percent Paced: 97 %
Brady Statistic RV Percent Paced: 4 %
Date Time Interrogation Session: 20230621143242
Implantable Lead Implant Date: 20170627
Implantable Lead Implant Date: 20170627
Implantable Lead Location: 753859
Implantable Lead Location: 753860
Implantable Lead Model: 377
Implantable Lead Model: 377
Implantable Lead Serial Number: 49436227
Implantable Lead Serial Number: 49471106
Implantable Pulse Generator Implant Date: 20170627
Lead Channel Impedance Value: 507 Ohm
Lead Channel Impedance Value: 605 Ohm
Lead Channel Pacing Threshold Amplitude: 1.1 V
Lead Channel Pacing Threshold Pulse Width: 0.4 ms
Lead Channel Sensing Intrinsic Amplitude: 1.1 mV
Lead Channel Sensing Intrinsic Amplitude: 6.5 mV
Lead Channel Setting Pacing Amplitude: 2 V
Lead Channel Setting Pacing Amplitude: 2.4 V
Lead Channel Setting Pacing Pulse Width: 0.75 ms
Pulse Gen Model: 394969
Pulse Gen Serial Number: 68786455

## 2021-12-30 NOTE — Progress Notes (Signed)
12/30/2021     CHIEF COMPLAINT Patient presents for  Chief Complaint  Patient presents with   Macular Degeneration      HISTORY OF PRESENT ILLNESS: [name] is a 71 y.o. female who presents to the clinic today for:   HPI   4 mos dilate OU, OCT. Patient states "my vision seems like it has been blurrier to me. A few months ago I think I saw 4 rapid fire flashes in my left eye but it didn't change anything so I didn't call. I have more floaters too, all the time, but not sure which eye." Denies hospitalizations or surgeries in the last four months.  Patient has a history of large subfoveal drusenoid vitelliform type lesion which has coincidentally improved after patient successfully had OSA diagnosed in commencement of CPAP therapy some 14 to 15 months previous. Last edited by Hurman Horn, MD on 12/30/2021  2:01 PM.      Referring physician: Kathyrn Drown, MD [address] [place],  [place] [address]  HISTORICAL INFORMATION:   Selected notes from the MEDICAL RECORD NUMBER    Lab Results  Component Value Date   HGBA1C 6.0 (H) 08/28/2021     CURRENT MEDICATIONS: No current outpatient medications on file. (Ophthalmic Drugs)   No current facility-administered medications for this visit. (Ophthalmic Drugs)   Current Outpatient Medications (Other)  Medication Sig   acetaminophen (TYLENOL) 650 MG CR tablet Take 650-1,300 mg by mouth See admin instructions. 1300MG IN THE MORNING AND 650MG AT BEDTIME   ALPRAZolam (XANAX XR) 1 MG 24 hr tablet Take 1 mg by mouth in the morning and at bedtime.   ALPRAZolam (XANAX) 1 MG tablet Take 0.5 mg by mouth daily as needed for anxiety.    aspirin 325 MG EC tablet Take 325 mg by mouth daily.   Cholecalciferol (VITAMIN D3) 1.25 MG (50000 UT) CAPS Take 1 capsule by mouth once a week.   glucosamine-chondroitin 500-400 MG tablet Take 2 tablets by mouth every morning.    lamoTRIgine (LAMICTAL) 200 MG tablet Take 400 mg by mouth  at bedtime.   Lutein 20 MG CAPS    Multiple Vitamin (MULTIVITAMIN) tablet Take 1 tablet by mouth daily.   QUEtiapine (SEROQUEL) 200 MG tablet Take 600 mg by mouth at bedtime.   rosuvastatin (CRESTOR) 10 MG tablet TAKE 1 TABLET(10 MG) BY MOUTH DAILY   sulfamethoxazole-trimethoprim (BACTRIM DS) 800-160 MG tablet Take 1 tablet by mouth 2 (two) times daily.   telmisartan (MICARDIS) 20 MG tablet Take by mouth. (Patient not taking: Reported on 09/22/2021)   No current facility-administered medications for this visit. (Other)      REVIEW OF SYSTEMS: ROS   Negative for: Constitutional, Gastrointestinal, Neurological, Skin, Genitourinary, Musculoskeletal, HENT, Endocrine, Cardiovascular, Eyes, Respiratory, Psychiatric, Allergic/Imm, Heme/Lymph Last edited by Hurman Horn, MD on 12/30/2021  1:54 PM.       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 Lovena Le   Sleep apnea    Past Surgical History:  Procedure Laterality Date   ABDOMINAL HYSTERECTOMY     APPENDECTOMY     BREAST BIOPSY Left 2011   BREAST BIOPSY  2008   BREAST LUMPECTOMY Left 2008   CHOLECYSTECTOMY     COLONOSCOPY N/A 09/13/2013   Procedure: COLONOSCOPY;  Surgeon: [name], MD;  [facility];  Service: Endoscopy;   Laterality: N/A;  730   EP IMPLANTABLE DEVICE N/A 01/06/2016   Procedure: Pacemaker Implant;  Surgeon: [name], MD;  Location: [facility];  Service: Cardiovascular;  Laterality: N/A;   ESOPHAGOGASTRODUODENOSCOPY N/A 01/17/2014   Procedure: ESOPHAGOGASTRODUODENOSCOPY (EGD);  Surgeon: [name], MD;  [facility];  Service: Endoscopy;  Laterality: N/A;  245   RIGHT/LEFT HEART CATH AND CORONARY ANGIOGRAPHY N/A 01/01/2020   Procedure: RIGHT/LEFT HEART CATH AND CORONARY ANGIOGRAPHY;  Surgeon: [name], MD;  Location: [place];  Service: Cardiovascular;  Laterality: N/A;   TOTAL KNEE ARTHROPLASTY Bilateral right knee   2012, 2007    FAMILY HISTORY Family History  Problem Relation Age of Onset   Bipolar disorder Mother    Diabetes Mother    Bipolar disorder Sister    Diabetes Sister    Alcohol abuse Father    Hypertension Father    Heart disease Other     SOCIAL HISTORY Social History   Tobacco Use   Smoking status: Never   Smokeless tobacco: Never  Vaping Use   Vaping Use: Never used  Substance Use Topics   Alcohol use: No    Alcohol/week: 0.0 standard drinks of alcohol   Drug use: No         OPHTHALMIC EXAM:  Base Eye Exam     Visual Acuity (ETDRS)       Right Left   Dist cc 20/25 20/25 -2    Correction: Glasses         Tonometry (Tonopen, 1:16 PM)       Right Left   Pressure 8 7         Pupils       Pupils Dark Light APD   Right PERRL 4 3 None   Left PERRL 4 3 None         Extraocular Movement       Right Left    Full Full         Neuro/Psych     Oriented x3: Yes   Mood/Affect: Normal         Dilation     Both eyes: 1.0% Mydriacyl, 2.5% Phenylephrine @ 1:16 PM           Slit Lamp and Fundus Exam     External Exam       Right Left   External Normal Normal         Slit Lamp Exam       Right Left   Lids/Lashes Normal Normal   Conjunctiva/Sclera White and quiet  White and quiet   Cornea Clear Clear   Anterior Chamber Deep and quiet Deep and quiet   Iris Round and reactive Round and reactive   Lens  Centered posterior chamber intraocular lens Centered posterior chamber intraocular lens, 1+ Posterior capsular opacification   Anterior Vitreous Normal Normal         Fundus Exam       Right Left   Posterior Vitreous clear vitreous, no PVD no PVD   Disc Normal Normal   C/D Ratio 0.1 0.2   Macula 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, Soft drusen in the FAZ now with some pigment, pigment over drusen INF, no macular thickening, no membrane, no hemorrhage 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, Soft drusen, no macular thickening, no membrane, no hemorrhage   Vessels Normal EmbolusPartial plaque occlusion cholesterol, superior to the optic nerve, This suggests branch retinal artery occlusion, incomplete, peripapillary cotton wool spots have subsided   Periphery Normal no holes or tears, 28 and 20 diopter lens Normal no holes or tears, 28 and 20 diopter lens            IMAGING AND PROCEDURES  Imaging and Procedures for 12/30/21  OCT, Retina - OU - Both Eyes       Right Eye Quality was good. Scan locations included subfoveal. Central Foveal Thickness: 371. Findings include abnormal foveal contour, retinal drusen , subretinal fluid.   Left Eye Quality was good. Scan locations included subfoveal. Central Foveal Thickness: 314. Progression has been stable. Findings include no IRF, no SRF, retinal drusen , vitreomacular adhesion .   Notes Much less intraretinal fluid inferior to the foveal zone as compared to OCT's dated 08/12/2020 and 08/26/2020, coincident with an improved overall central field thickness, from baseline of 221 m to now 134 m, yet now stabilized with continued vitreous attachment to the macular region, see the OCT.  Type I macular hole with elevation of the fovea from VMA yet with good acuity, and slightly less overall  with use of consistently of CPAP.  We will continue to monitor and look for spontaneous resolution of the vitreomacular lesion but now with some inner foveal schisis we will continue to monitor with good acuity  Now on CPAP consistent use for the last 14 months             ASSESSMENT/PLAN:  Obstructive sleep apnea of adult On CPAP now roughly 15 months.  Type 2 macular telangiectasis, right Large foveal drusenoid deposit likely related to MAC-TEL in my opinion yet continues to improve coincident with successful CPAP use over the last 14 to 15 months.  Continue to observe.  Much less intraretinal fluid inferior to the foveal zone as compared to OCT's dated 08/12/2020 and 08/26/2020, coincident with an improved overall central field thickness, from baseline of 221 m to now 134 m, yet now stabilized with continued vitreous attachment to the macular region, see the OCT.  Type I macular hole with elevation of the fovea from VMA yet with good acuity, and slightly less overall with use of consistently of CPAP.  We will continue to monitor and look for spontaneous resolution of the vitreomacular lesion but now with some inner foveal schisis we will continue to monitor with good acuity  Now on CPAP consistent use for the last 14 months  Intermediate stage nonexudative age-related macular degeneration of both eyes OS stable  ODMuch less intraretinal fluid inferior to the foveal zone as compared to OCT's dated 08/12/2020 and 08/26/2020, coincident with an improved overall central field thickness, from baseline of 221 m to now 134 m, yet now stabilized with continued vitreous attachment to the macular region, see the OCT.  Type I macular  hole with elevation of the fovea from VMA yet with good acuity, and slightly less overall with use of consistently of CPAP.  We will continue to monitor and look for spontaneous resolution of the vitreomacular lesion but now with some inner foveal schisis we will  continue to monitor with good acuity  Now on CPAP consistent use for the last 14 months  Vitreomacular adhesion of right eye May be triggering some intermittent retinal foveal macular schisis  Macular retinoschisis of right eye We will monitor, if it visual acuity declines reevaluate     ICD-10-CM   1. 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 Obstructive sleep apnea of adult  G47.33     3. Type 2 macular telangiectasis, right  H35.071     4. Intermediate stage nonexudative age-related macular degeneration of both eyes  H35.3132     5. Vitreomacular adhesion of right eye  H43.821     6. Macular retinoschisis of right eye  H33.101       1.  History of BRAO, no signs of complication, OS  2.  Subfoveal large drusenoid vitelliform type lesion in the macula is continue to resolve and improve with sole treatment of CPAP for her previously and recently diagnosed OSA.  3.  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      Return in about 6 months (around 07/01/2022) for DILATE OU, OCT, COLOR FP.  There are no Patient Instructions on file for this visit.   Explained the diagnoses, plan, and follow up with the patient and they expressed understanding.  Patient expressed understanding of the importance of proper follow up care.   Clent Demark Oluwadamilare Tobler M.D. Diseases & Surgery of the Retina and Vitreous Retina & Diabetic [place] 12/30/21     Abbreviations: M myopia (nearsighted); A astigmatism; H hyperopia (farsighted); P presbyopia; Mrx spectacle prescription;  CTL contact lenses; OD right eye; OS left eye; OU both eyes  XT exotropia; ET esotropia; PEK punctate epithelial keratitis; PEE punctate epithelial erosions; DES dry eye syndrome; MGD meibomian gland  dysfunction; ATs artificial tears; PFAT's preservative free artificial tears; [place] nuclear sclerotic cataract; PSC posterior subcapsular cataract; ERM epi-retinal membrane; PVD posterior vitreous detachment; RD retinal detachment; DM diabetes mellitus; DR diabetic retinopathy; NPDR non-proliferative diabetic retinopathy; PDR proliferative diabetic retinopathy; CSME clinically significant macular edema; DME diabetic macular edema; dbh dot blot hemorrhages; CWS cotton wool spot; POAG primary open angle glaucoma; C/D cup-to-disc ratio; HVF humphrey visual field; GVF goldmann visual field; OCT optical coherence tomography; IOP intraocular pressure; BRVO Branch retinal vein occlusion; CRVO central retinal vein occlusion; CRAO central retinal artery occlusion; BRAO branch retinal artery occlusion; RT retinal tear; SB scleral buckle; PPV pars plana vitrectomy; VH Vitreous hemorrhage; PRP panretinal laser photocoagulation; IVK intravitreal kenalog; VMT vitreomacular traction; MH Macular hole;  NVD neovascularization of the disc; NVE neovascularization elsewhere; AREDS age related eye disease study; ARMD age related macular degeneration; POAG primary open angle glaucoma; EBMD epithelial/anterior basement membrane dystrophy; ACIOL anterior chamber intraocular lens; IOL intraocular lens; PCIOL posterior chamber intraocular lens; Phaco/IOL phacoemulsification with intraocular lens placement; [place] photorefractive keratectomy; LASIK laser assisted in situ keratomileusis; HTN hypertension; DM diabetes mellitus; COPD chronic obstructive pulmonary disease

## 2021-12-30 NOTE — Assessment & Plan Note (Signed)
May be triggering some intermittent retinal foveal macular schisis

## 2021-12-30 NOTE — Assessment & Plan Note (Signed)
On CPAP now roughly 15 months.

## 2021-12-30 NOTE — Assessment & Plan Note (Signed)
Large foveal drusenoid deposit likely related to MAC-TEL in my opinion yet continues to improve coincident with successful CPAP use over the last 14 to 15 months.  Continue to observe.  Much less intraretinal fluid inferior to the foveal zone as compared to OCT's dated 08/12/2020 and 08/26/2020, coincident with an improved overall central field thickness, from baseline of 221 m to now 134 m, yet now stabilized with continued vitreous attachment to the macular region, see the OCT.  Type I macular hole with elevation of the fovea from VMA yet with good acuity, and slightly less overall with use of consistently of CPAP.  We will continue to monitor and look for spontaneous resolution of the vitreomacular lesion but now with some inner foveal schisis we will continue to monitor with good acuity  Now on CPAP consistent use for the last 14 months

## 2021-12-30 NOTE — Assessment & Plan Note (Signed)
OS stable  ODMuch less intraretinal fluid inferior to the foveal zone as compared to OCT's dated 08/12/2020 and 08/26/2020, coincident with an improved overall central field thickness, from baseline of 221 m to now 134 m, yet now stabilized with continued vitreous attachment to the macular region, see the OCT.  Type I macular hole with elevation of the fovea from VMA yet with good acuity, and slightly less overall with use of consistently of CPAP.  We will continue to monitor and look for spontaneous resolution of the vitreomacular lesion but now with some inner foveal schisis we will continue to monitor with good acuity  Now on CPAP consistent use for the last 14 months

## 2021-12-30 NOTE — Assessment & Plan Note (Signed)
We will monitor, if it visual acuity declines reevaluate

## 2021-12-31 ENCOUNTER — Ambulatory Visit (INDEPENDENT_AMBULATORY_CARE_PROVIDER_SITE_OTHER): Payer: PPO

## 2021-12-31 DIAGNOSIS — I495 Sick sinus syndrome: Secondary | ICD-10-CM

## 2022-01-04 ENCOUNTER — Encounter: Payer: Self-pay | Admitting: Cardiology

## 2022-01-04 ENCOUNTER — Ambulatory Visit
Admission: EM | Admit: 2022-01-04 | Discharge: 2022-01-04 | Disposition: A | Discharge status: Home / Self Care | Payer: PPO | Attending: Nurse Practitioner | Admitting: Nurse Practitioner

## 2022-01-04 ENCOUNTER — Other Ambulatory Visit: Payer: Self-pay

## 2022-01-04 ENCOUNTER — Telehealth: Payer: Self-pay | Admitting: Cardiology

## 2022-01-04 DIAGNOSIS — R42 Dizziness and giddiness: Secondary | ICD-10-CM | POA: Diagnosis not present

## 2022-01-04 MED ORDER — MECLIZINE HCL 12.5 MG PO TABS: 12.5000 mg | ORAL_TABLET | Freq: Three times a day (TID) | ORAL | 0 refills | Status: DC | PRN

## 2022-01-04 NOTE — Telephone Encounter (Signed)
Patient states she was taken off of Micardis. The only cardiac medication she is taking is Crestor.   She reports her Seroquel 600 mg hs was recently reduced to 500 mg and wonders if this could be causing her dizziness.Her provider was not sure.    She describes a room spinning sensation, she has eaten and drinks lots of water daily.    Her BP is 138/84, HR 72    She is going to call the device clinic to send a reading from her pacemaker and the go to Urgent care.    I will FYI Dr.McDowell

## 2022-01-07 NOTE — Progress Notes (Signed)
Remote pacemaker transmission.   

## 2022-01-18 NOTE — Progress Notes (Unsigned)
PATIENT: [name] DOB: 02/07/1951  REASON FOR VISIT: follow up HISTORY FROM: patient PRIMARY NEUROLOGIST: Dr. Vickey Huger  Virtual Visit via Video Note  I connected with Minna Merritts on 01/19/22 at  1:00 PM EDT by a video enabled telemedicine application located remotely at [facility] and verified that I am speaking with the correct person using two identifiers who was located at their own home.   I discussed the limitations of evaluation and management by telemedicine and the availability of in person appointments. The patient expressed understanding and agreed to proceed.   PATIENT: [name] DOB: 02/06/1951  REASON FOR VISIT: follow up HISTORY FROM: patient  HISTORY OF PRESENT ILLNESS: Today 01/19/22:  Ms. Donnelly is a 71 year old female with  OSA on CPAP.She returns today for follow-up. Reports that CPAP is working well.  She states that she does notice that the CPAP straps is causing her hair to thin on the sides.  Denies any other issues.  She joins [place] today for virtual visit.    REVIEW OF SYSTEMS: Out of a complete 14 system review of symptoms, the patient complains only of the following symptoms, and all other reviewed systems are negative.  ALLERGIES: Allergies  Allergen Reactions   Imipramine Hives and Rash   Tricor [Fenofibrate] Other (See Comments)    Pain, "aching"    HOME MEDICATIONS: Outpatient Medications Prior to Visit  Medication Sig Dispense Refill   acetaminophen (TYLENOL) 650 MG CR tablet Take 650-1,300 mg by mouth See admin instructions. 1300MG  IN THE MORNING AND 650MG  AT BEDTIME     ALPRAZolam (XANAX XR) 1 MG 24 hr tablet Take 1 mg by mouth in the morning and at bedtime.     ALPRAZolam (XANAX) 1 MG tablet Take 0.5 mg by mouth daily as needed for anxiety.      aspirin 325 MG EC tablet Take 325 mg by mouth daily.     Cholecalciferol (VITAMIN D3) 1.25 MG (50000 UT) CAPS Take 1 capsule by mouth once a week.      glucosamine-chondroitin 500-400 MG tablet Take 2 tablets by mouth every morning.      lamoTRIgine (LAMICTAL) 200 MG tablet Take 400 mg by mouth at bedtime.     Lutein 20 MG CAPS      meclizine (ANTIVERT) 12.5 MG tablet Take 1 tablet (12.5 mg total) by mouth 3 (three) times daily as needed for dizziness. 30 tablet 0   Multiple Vitamin (MULTIVITAMIN) tablet Take 1 tablet by mouth daily.     QUEtiapine (SEROQUEL) 200 MG tablet Take 600 mg by mouth at bedtime.     rosuvastatin (CRESTOR) 10 MG tablet TAKE 1 TABLET(10 MG) BY MOUTH DAILY 90 tablet 3   sulfamethoxazole-trimethoprim (BACTRIM DS) 800-160 MG tablet Take 1 tablet by mouth 2 (two) times daily. 10 tablet 0   telmisartan (MICARDIS) 20 MG tablet Take by mouth. (Patient not taking: Reported on 09/22/2021)     No facility-administered medications prior to visit.    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 Ladona Ridgel   Sleep apnea     PAST SURGICAL HISTORY: Past Surgical History:  Procedure Laterality Date   ABDOMINAL HYSTERECTOMY     APPENDECTOMY     BREAST BIOPSY Left 2011   BREAST BIOPSY  2008   BREAST LUMPECTOMY Left 2008   CHOLECYSTECTOMY     COLONOSCOPY N/A 09/13/2013   Procedure: COLONOSCOPY;  Surgeon: Malissa Hippo, MD;  [facility];  Service: Endoscopy;  Laterality: N/A;  730   EP IMPLANTABLE DEVICE N/A 01/06/2016   Procedure: Pacemaker Implant;  Surgeon:  Marinus Maw, MD;  Location: [facility] INVASIVE CV LAB;  Service: Cardiovascular;  Laterality: N/A;   ESOPHAGOGASTRODUODENOSCOPY N/A 01/17/2014   Procedure: ESOPHAGOGASTRODUODENOSCOPY (EGD);  Surgeon: Malissa Hippo, MD;  [facility];  Service: Endoscopy;  Laterality: N/A;  245   RIGHT/LEFT HEART CATH AND CORONARY ANGIOGRAPHY N/A 01/01/2020   Procedure: RIGHT/LEFT HEART CATH AND CORONARY ANGIOGRAPHY;  Surgeon: Kathleene Hazel, MD;  Location: [facility] INVASIVE CV LAB;  Service: Cardiovascular;  Laterality: N/A;   TOTAL KNEE ARTHROPLASTY Bilateral right knee   2012, 2007    FAMILY HISTORY: Family History  Problem Relation Age of Onset   Bipolar disorder Mother    Diabetes Mother    Bipolar disorder Sister    Diabetes Sister    Alcohol abuse Father    Hypertension Father    Heart disease Other     SOCIAL HISTORY: Social History   Socioeconomic History   Marital status: Married    Spouse name: Not on file   Number of children: 3   Years of education: college   Highest education level: Associate degree: occupational, Scientist, product/process development, or vocational program  Occupational History   Occupation: Charity fundraiser  Tobacco Use   Smoking status: Never   Smokeless tobacco: Never  Vaping Use   Vaping Use: Never used  Substance and Sexual Activity   Alcohol use: No    Alcohol/week: 0.0 standard drinks of alcohol   Drug use: No   Sexual activity: Not Currently  Other Topics Concern   Not on file  Social History Narrative   04/24/2013 [name] was born and grew up in [place]. She reports that her childhood was "tough." She has 2 older sisters and a younger brother. She achieved an Associates Degree in nursing. She has been working as an Charity fundraiser for 40 years. She is separated from her husband for 6 years. She has 2 daughters and one son. She denies any legal difficulties. She affiliates as Curator. Her hobbies include reading, sewing, crafts, and cooking. She reports that her social support  system consists of her friend and passed her. 04/24/2013 [name]      Lives alone.   Right-handed.   No caffeine use.   Social Determinants of Health   Financial Resource Strain: Low Risk  (09/01/2021)   Overall Financial Resource Strain (CARDIA)    Difficulty of Paying Living Expenses: Not hard at all  Food Insecurity: No Food Insecurity (09/01/2021)   Hunger Vital Sign    Worried About Running Out of Food in the Last Year: Never true    Ran Out of Food in the Last Year: Never true  Transportation Needs: No Transportation Needs (09/01/2021)   PRAPARE - Transportation  Lack of Transportation (Medical): No    Lack of Transportation (Non-Medical): No  Physical Activity: Sufficiently Active (09/01/2021)   Exercise Vital Sign    Days of Exercise per Week: 4 days    Minutes of Exercise per Session: 50 min  Stress: No Stress Concern Present (09/01/2021)   [employer] of Occupational Health - Occupational Stress Questionnaire    Feeling of Stress : Only a little  Social Connections: Socially Integrated (09/01/2021)   Social Connection and Isolation Panel [NHANES]    Frequency of Communication with Friends and Family: More than three times a week    Frequency of Social Gatherings with Friends and Family: More than three times a week    Attends Religious Services: More than 4 times per year    Active Member of [employer] or Organizations: Yes    Attends Banker Meetings: More than 4 times per year    Marital Status: Married  Catering manager Violence: Not At Risk (09/01/2021)   Humiliation, Afraid, Rape, and Kick questionnaire    Fear of Current or Ex-Partner: No    Emotionally Abused: No    Physically Abused: No    Sexually Abused: No      PHYSICAL EXAM Generalized: Well developed, in no acute distress   Neurological examination  Mentation: Alert oriented to time, place, history taking. Follows all commands speech and language fluent Cranial nerve II-XII:Extraocular  movements were full. Facial symmetry noted.  Head turning and shoulder shrug  were normal and symmetric.   DIAGNOSTIC DATA (LABS, IMAGING, TESTING) - I reviewed patient records, labs, notes, testing and imaging myself where available.  Lab Results  Component Value Date   WBC 6.1 01/26/2021   HGB 13.2 01/26/2021   HCT 41.1 01/26/2021   MCV 100.0 01/26/2021   PLT 206 01/26/2021      Component Value Date/Time   NA 143 08/28/2021 0900   K 4.4 08/28/2021 0900   CL 103 08/28/2021 0900   CO2 25 08/28/2021 0900   GLUCOSE 125 (H) 08/28/2021 0900   GLUCOSE 162 (H) 01/26/2021 1032   BUN 18 08/28/2021 0900   CREATININE 0.90 08/28/2021 0900   CREATININE 0.80 12/30/2015 1138   CALCIUM 10.0 08/28/2021 0900   PROT 7.0 01/26/2021 1032   PROT 6.9 12/03/2020 0845   ALBUMIN 4.2 01/26/2021 1032   ALBUMIN 4.7 12/03/2020 0845   AST 30 01/26/2021 1032   ALT 23 01/26/2021 1032   ALKPHOS 77 01/26/2021 1032   BILITOT 0.6 01/26/2021 1032   BILITOT 0.8 12/03/2020 0845   GFRNONAA >60 04/28/2021 1043   GFRAA 84 06/30/2020 0848   Lab Results  Component Value Date   CHOL 110 08/28/2021   HDL 43 08/28/2021   LDLCALC 43 08/28/2021   TRIG 138 08/28/2021   CHOLHDL 2.6 08/28/2021   Lab Results  Component Value Date   HGBA1C 6.0 (H) 08/28/2021   Lab Results  Component Value Date   VITAMINB12 866 12/12/2017   Lab Results  Component Value Date   TSH 1.580 01/15/2020      ASSESSMENT AND PLAN 71 y.o. 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 here with:  OSA on CPAP  CPAP compliance excellent Residual AHI is good Encouraged patient to continue using CPAP nightly and > 4 hours each night F/U in 1 year or sooner if needed    Butch Penny, MSN, NP-C 01/19/2022, 12:55 PM [facility] [address] [place], [place] [address] [phone]

## 2022-01-19 ENCOUNTER — Telehealth: Payer: PPO | Admitting: Adult Health

## 2022-01-19 DIAGNOSIS — G4733 Obstructive sleep apnea (adult) (pediatric): Secondary | ICD-10-CM | POA: Diagnosis not present

## 2022-01-19 DIAGNOSIS — Z9989 Dependence on other enabling machines and devices: Secondary | ICD-10-CM

## 2022-01-19 NOTE — Progress Notes (Signed)
[facility] PREP Weekly Session  Patient Details  Name: [name] MRN: [id number] Date of Birth: 1951-04-02 Age: 71 y.o. PCP: Kathyrn Drown, MD  Vitals:   01/19/22 1609  Weight: 264 lb (119.7 kg)     [facility] Weekly seesion - 01/19/22 1600       [facility] "PREP" Location   [facility]PRE[facility] Location [facility]      Weekly Session   Topic Discussed Eating for the season   portions and label reading   Minutes exercised this week 260 minutes    Classes attended to date [place] 01/19/2022, 4:10 PM

## 2022-01-20 IMAGING — DX DG CHEST 1V PORT
1 series · 1 of 1 positions shown · non-contrast
Comparison: Radiograph 02/02/2016, CT 06/16/2007

CLINICAL DATA: SHORTNESS OF BREATH AND GENERALIZED WEAKNESS

EXAM:
PORTABLE CHEST 1 VIEW

[chest ap grid]
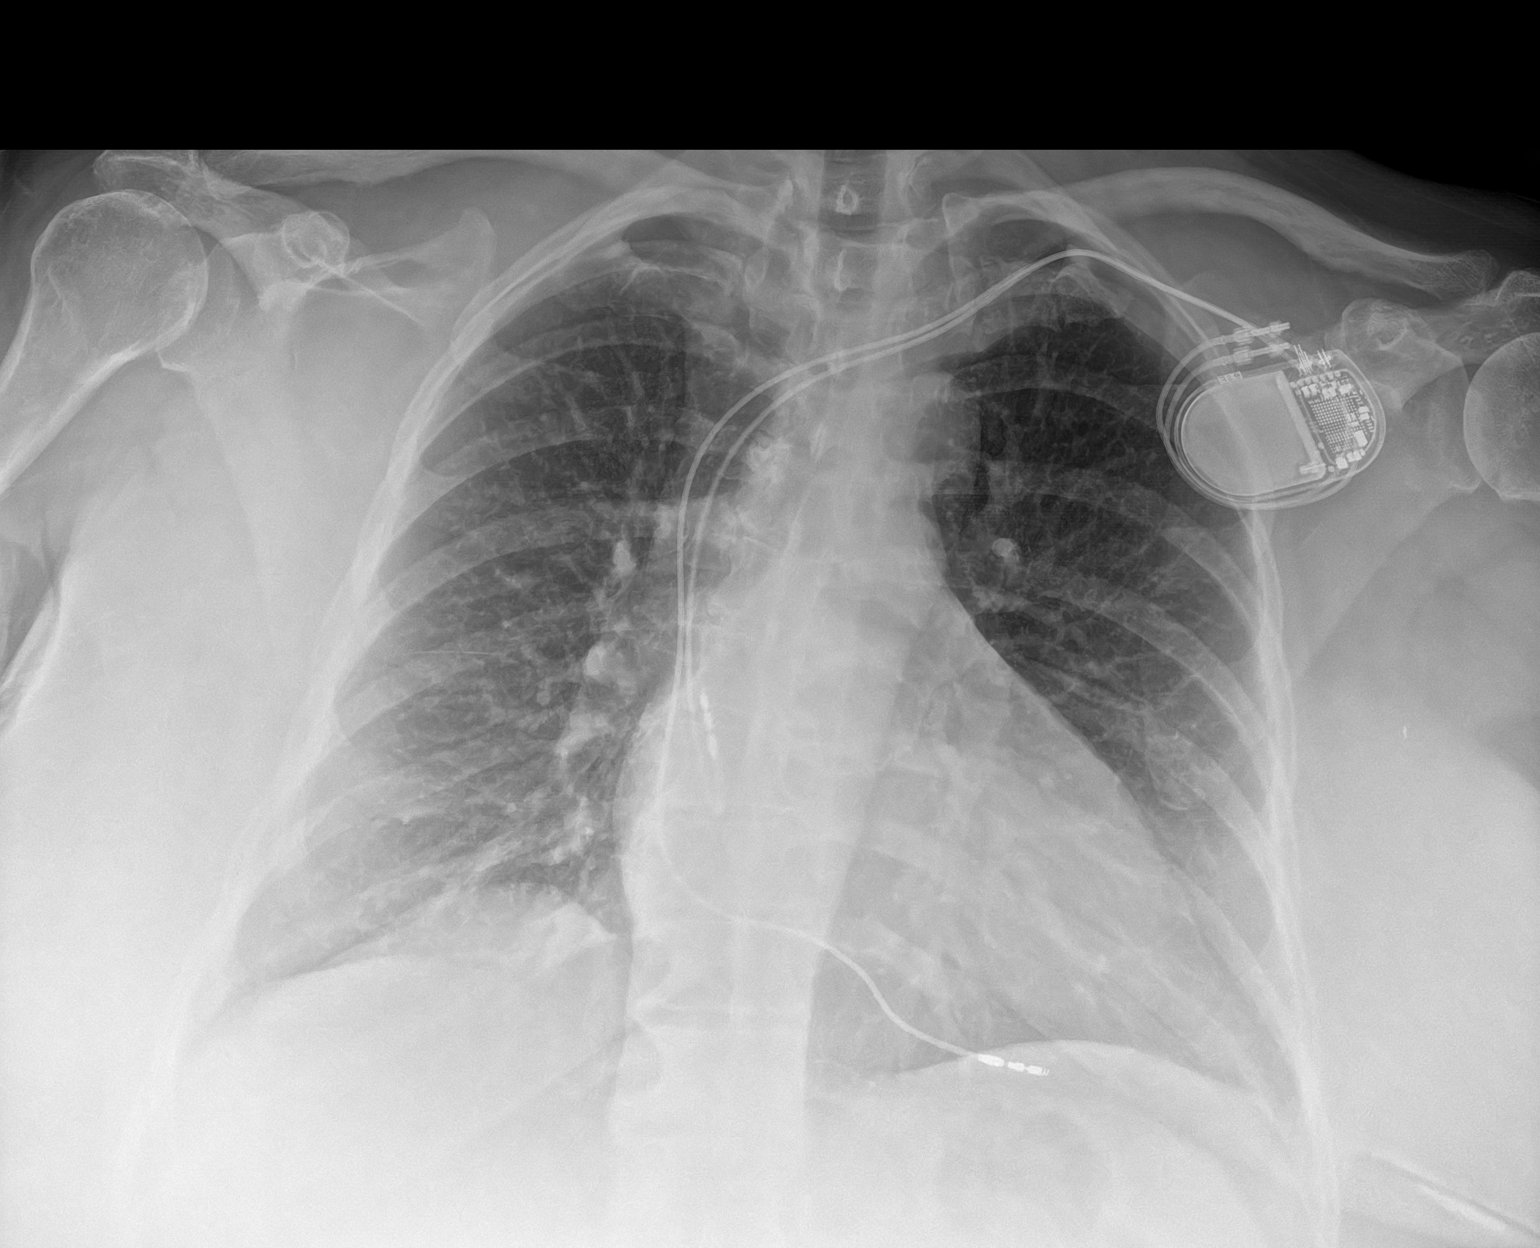

[1 of 1 positions shown; findings below may reference images not displayed]

FINDINGS: Low volumes with some basilar atelectatic change. There is
increasing interstitial opacities throughout both lungs with a
basilar and perihilar predominance. Some mild septal and fissural
thickening is noted as well. The pulmonary vascularity is congested
and indistinct. Cardiac silhouette is enlarged from prior portable
radiograph. Pacer pack overlies left chest wall with leads in the
right atrium and cardiac apex in similar position to comparison. No
pneumothorax. No visible effusion at this time. No acute osseous or
soft tissue abnormality. Degenerative changes are present in the
imaged spine and shoulders. Dextrocurvature of the spine similar to
prior.
IMPRESSION: Cardiomegaly with increasing interstitial opacities throughout both
lungs with a basilar and perihilar predominance. Findings likely
represent CHF/volume overload with some developing interstitial
pulmonary edema.

## 2022-01-26 NOTE — Progress Notes (Signed)
[facility] PREP Weekly Session  Patient Details  Name: [name] MRN: [id number] Date of Birth: 11-01-1950 Age: 71 y.o. PCP: Kathyrn Drown, MD  There were no vitals filed for this visit.   [facility] Weekly seesion - 01/26/22 1700       [facility] "PREP" Location   [facility] "PREP[facility] Location [facility]      Weekly Session   Topic Discussed Finding support    Minutes exercised this week 350 minutes    Classes attended to date 34             [place] 01/26/2022, 5:26 PM

## 2022-02-02 ENCOUNTER — Other Ambulatory Visit: Payer: Self-pay | Admitting: Cardiology

## 2022-02-02 NOTE — Progress Notes (Signed)
[facility] PREP Weekly Session  Patient Details  Name: [name] MRN: [id number] Date of Birth: 1951/06/15 Age: 71 y.o. PCP: Kathyrn Drown, MD  Vitals:   02/02/22 1400  Weight: 263 lb 11.2 oz (119.6 kg)     [facility] Weekly seesion - 02/02/22 1700       [facility] "PREP" Location   [facility]PREP[facility] Location [facility]      Weekly Session   Topic Discussed Calorie breakdown    Minutes exercised this week 300 minutes    Classes attended to date 35             [place] 02/02/2022, 5:01 PM

## 2022-02-04 ENCOUNTER — Telehealth: Payer: Self-pay

## 2022-02-04 ENCOUNTER — Encounter: Payer: Self-pay | Admitting: Family Medicine

## 2022-02-04 ENCOUNTER — Encounter: Payer: Self-pay | Admitting: Cardiology

## 2022-02-04 DIAGNOSIS — Z79899 Other long term (current) drug therapy: Secondary | ICD-10-CM

## 2022-02-04 DIAGNOSIS — I10 Essential (primary) hypertension: Secondary | ICD-10-CM

## 2022-02-04 DIAGNOSIS — E781 Pure hyperglyceridemia: Secondary | ICD-10-CM

## 2022-02-04 DIAGNOSIS — R7303 Prediabetes: Secondary | ICD-10-CM

## 2022-02-04 MED ORDER — ASPIRIN 81 MG PO TBEC: 81.0000 mg | DELAYED_RELEASE_TABLET | Freq: Every day | ORAL | 3 refills | Status: DC

## 2022-02-04 NOTE — Telephone Encounter (Signed)
Recommend lipid, liver, metabolic 7, X9B Prediabetes hyperlipidemia hypertension  Also please forward the following [employer] job with losing weight.  Continue healthy habits.  We will see you soon.  [facility]Care-Dr. Nicki Reaper

## 2022-02-04 NOTE — Telephone Encounter (Signed)
[name], MD  Bernita Raisin, RN Yes, reducing ASA to 81 mg daily would be reasonable for more long term use.

## 2022-02-08 DIAGNOSIS — E781 Pure hyperglyceridemia: Secondary | ICD-10-CM | POA: Diagnosis not present

## 2022-02-08 DIAGNOSIS — Z79899 Other long term (current) drug therapy: Secondary | ICD-10-CM | POA: Diagnosis not present

## 2022-02-08 DIAGNOSIS — R7303 Prediabetes: Secondary | ICD-10-CM | POA: Diagnosis not present

## 2022-02-08 DIAGNOSIS — I10 Essential (primary) hypertension: Secondary | ICD-10-CM | POA: Diagnosis not present

## 2022-02-09 ENCOUNTER — Encounter (INDEPENDENT_AMBULATORY_CARE_PROVIDER_SITE_OTHER): Payer: Self-pay | Admitting: Ophthalmology

## 2022-02-09 ENCOUNTER — Ambulatory Visit (INDEPENDENT_AMBULATORY_CARE_PROVIDER_SITE_OTHER): Payer: PPO

## 2022-02-09 ENCOUNTER — Ambulatory Visit
Admission: EM | Admit: 2022-02-09 | Discharge: 2022-02-09 | Disposition: A | Discharge status: Home / Self Care | Payer: PPO | Attending: Nurse Practitioner | Admitting: Nurse Practitioner

## 2022-02-09 ENCOUNTER — Encounter: Payer: Self-pay | Admitting: Emergency Medicine

## 2022-02-09 DIAGNOSIS — M25571 Pain in right ankle and joints of right foot: Secondary | ICD-10-CM

## 2022-02-09 DIAGNOSIS — M79671 Pain in right foot: Secondary | ICD-10-CM

## 2022-02-09 DIAGNOSIS — M7731 Calcaneal spur, right foot: Secondary | ICD-10-CM | POA: Diagnosis not present

## 2022-02-09 DIAGNOSIS — M19071 Primary osteoarthritis, right ankle and foot: Secondary | ICD-10-CM | POA: Diagnosis not present

## 2022-02-09 LAB — HEPATIC FUNCTION PANEL
ALT: 16 IU/L (ref 0–32)
AST: 24 IU/L (ref 0–40)
Albumin: 4.7 g/dL (ref 3.9–4.9)
Alkaline Phosphatase: 93 IU/L (ref 44–121)
Bilirubin Total: 0.6 mg/dL (ref 0.0–1.2)
Bilirubin, Direct: 0.19 mg/dL (ref 0.00–0.40)
Total Protein: 6.7 g/dL (ref 6.0–8.5)

## 2022-02-09 LAB — LIPID PANEL
Chol/HDL Ratio: 2.4 ratio (ref 0.0–4.4)
Cholesterol, Total: 105 mg/dL (ref 100–199)
HDL: 44 mg/dL (ref >39)
LDL Chol Calc (NIH): 36 mg/dL (ref 0–99)
Triglycerides: 148 mg/dL (ref 0–149)
VLDL Cholesterol Cal: 25 mg/dL (ref 5–40)

## 2022-02-09 LAB — HEMOGLOBIN A1C
Est. average glucose Bld gHb Est-mCnc: 123 mg/dL
Hgb A1c MFr Bld: 5.9 % — ABNORMAL HIGH (ref 4.8–5.6)

## 2022-02-09 LAB — BASIC METABOLIC PANEL
BUN/Creatinine Ratio: 8 — ABNORMAL LOW (ref 12–28)
BUN: 7 mg/dL — ABNORMAL LOW (ref 8–27)
CO2: 24 mmol/L (ref 20–29)
Calcium: 9.6 mg/dL (ref 8.7–10.3)
Chloride: 102 mmol/L (ref 96–106)
Creatinine, Ser: 0.9 mg/dL (ref 0.57–1.00)
Glucose: 117 mg/dL — ABNORMAL HIGH (ref 70–99)
Potassium: 4.1 mmol/L (ref 3.5–5.2)
Sodium: 144 mmol/L (ref 134–144)
eGFR: 69 mL/min/{1.73_m2} (ref >59)

## 2022-02-09 NOTE — ED Triage Notes (Signed)
Right ankle and lower leg pain.  States she was working out yesterday and that is when the pain started.

## 2022-02-09 NOTE — ED Provider Notes (Signed)
[facility]    CSN: [id number] Arrival date & time: 02/09/22  1612      History   Chief Complaint No chief complaint on file.   HPI [name] is a 71 y.o. female.   Patient presents with right ankle and right foot pain that began after pedaling backwards on a recumbent elliptical earlier this week.  Reports she started a new exercise program 3 months ago and has been doing well.  She denies any swelling, redness, decreased range of motion.  She does have significant pain around the ankle and into the midfoot.  No numbness or tingling into the toes, no shooting pain, no skin discoloration.  She is able to bear weight.    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   Essential hypertension 03/06/2020   Prediabetes 02/10/2020   Cardiac pacemaker in situ 01/07/2020   At risk for central  sleep apnea 01/07/2020   Obesity with alveolar hypoventilation and body mass index (BMI) of 40 or greater ([name]) 01/07/2020   DOE (dyspnea on exertion) 12/05/2019   Chronic diastolic CHF (congestive heart failure) ([place]) 12/05/2019   Memory loss 12/12/2017   Morbid obesity ([name]) 09/05/2017   Sinus node dysfunction ([place]) 01/06/2016   Bipolar disorder ([name]) 10/22/2015   Anxiety 10/22/2015   Episodic lightheadedness    GERD (gastroesophageal reflux disease) 01/08/2014   Osteoarthritis 11/08/2013   Lumbar back pain 11/08/2013   History of left breast cancer (2013) 07/22/2011    Past Surgical History:  Procedure Laterality Date   ABDOMINAL HYSTERECTOMY     APPENDECTOMY     BREAST BIOPSY Left 2011   BREAST BIOPSY  2008   BREAST LUMPECTOMY Left 2008   CHOLECYSTECTOMY     COLONOSCOPY N/A 09/13/2013   Procedure: COLONOSCOPY;  Surgeon: [name], MD;  [facility];  Service: Endoscopy;  Laterality: N/A;  730   EP IMPLANTABLE DEVICE N/A 01/06/2016   Procedure: Pacemaker Implant;  Surgeon: [name], MD;  Location: [place] CV LAB;  Service: Cardiovascular;  Laterality: N/A;   ESOPHAGOGASTRODUODENOSCOPY N/A 01/17/2014   Procedure: ESOPHAGOGASTRODUODENOSCOPY (EGD);  Surgeon: [name], MD;  [facility];  Service: Endoscopy;  Laterality: N/A;  245   RIGHT/LEFT HEART CATH AND CORONARY ANGIOGRAPHY  N/A 01/01/2020   Procedure: RIGHT/LEFT HEART CATH AND CORONARY ANGIOGRAPHY;  Surgeon: [name], MD;  Location: [place] CV LAB;  Service: Cardiovascular;  Laterality: N/A;   TOTAL KNEE ARTHROPLASTY Bilateral right knee   2012, 2007    OB History   No obstetric history on file.      Home Medications    Prior to Admission medications   Medication Sig Start Date End Date Taking? Authorizing Provider  acetaminophen (TYLENOL) 650 MG CR tablet Take 650-1,300 mg by mouth See admin instructions. '1300MG'$  IN THE MORNING AND '650MG'$  AT BEDTIME     [provider]  ALPRAZolam (XANAX XR) 1 MG 24 hr tablet Take 1 mg by mouth in the morning and at bedtime.    [provider]  ALPRAZolam [name]) 1 MG tablet Take 0.5 mg by mouth daily as needed for anxiety.     [provider]  aspirin EC 81 MG tablet Take 1 tablet (81 mg total) by mouth daily. Swallow whole. 02/04/22   [name], MD  Cholecalciferol (VITAMIN D3) 1.25 MG (50000 UT) CAPS Take 1 capsule by mouth once a week. 08/07/21   [provider]  glucosamine-chondroitin 500-400 MG tablet Take 2 tablets by mouth every morning.     [provider]  lamoTRIgine (LAMICTAL) 200 MG tablet Take 400 mg by mouth at bedtime.    [provider]  Lutein 20 MG CAPS  01/28/21   [provider]  meclizine (ANTIVERT) 12.5 MG tablet Take 1 tablet (12.5 mg total) by mouth 3 (three) times daily as needed for dizziness. 01/04/22   [name], NP  Multiple Vitamin (MULTIVITAMIN) tablet Take 1 tablet by mouth daily.    [provider]  QUEtiapine (SEROQUEL) 200 MG tablet Take 600 mg by mouth at bedtime. 05/07/13   [name], MD  rosuvastatin (CRESTOR) 10 MG tablet TAKE 1 TABLET(10 MG) BY MOUTH DAILY 02/02/22   [name], MD  telmisartan (MICARDIS) 20 MG tablet Take by mouth. Patient not taking: Reported on 09/22/2021 08/12/21   [provider]    Family History Family History  Problem Relation Age of Onset   Bipolar disorder Mother    Diabetes Mother    Bipolar disorder Sister    Diabetes Sister    Alcohol abuse Father    Hypertension Father    Heart disease Other     Social History Social History   Tobacco Use   Smoking status: Never   Smokeless tobacco: Never  Vaping Use   Vaping Use: Never used  Substance Use Topics   Alcohol use: No    Alcohol/week: 0.0 standard drinks of alcohol   Drug use: No     Allergies   Imipramine and Tricor [fenofibrate]   Review of Systems Review  of Systems Per HPI  Physical Exam Triage Vital Signs ED Triage Vitals  Enc Vitals Group     BP 02/09/22 1617 123/87     Pulse Rate 02/09/22 1617 96     Resp 02/09/22 1617 18     Temp 02/09/22 1617 98.5 F (36.9 C)     Temp Source 02/09/22 1617 Oral     SpO2 02/09/22 1617 94 %     Weight --      Height --      Head Circumference --      Peak Flow --      Pain Score 02/09/22 1618 8     Pain Loc --  Pain Edu? --      Excl. in [place]? --    No data found.  Updated Vital Signs BP 123/87 (BP Location: Right Arm)   Pulse 96   Temp 98.5 F (36.9 C) (Oral)   Resp 18   SpO2 94%   Visual Acuity Right Eye Distance:   Left Eye Distance:   Bilateral Distance:    Right Eye Near:   Left Eye Near:    Bilateral Near:     Physical Exam Vitals and nursing note reviewed.  Constitutional:      General: She is not in acute distress.    Appearance: Normal appearance. She is not toxic-appearing.  HENT:     Mouth/Throat:     Mouth: Mucous membranes are moist.     Pharynx: Oropharynx is clear.  Pulmonary:     Effort: Pulmonary effort is normal. No respiratory distress.  Musculoskeletal:     Right ankle: No swelling or deformity. Tenderness present over the lateral malleolus, ATF ligament, AITF ligament, CF ligament and base of 5th metatarsal. No medial malleolus tenderness. Normal pulse.     Left ankle: Normal.     Right foot: Normal range of motion and normal capillary refill. Tenderness and bony tenderness present. No swelling. Normal pulse.     Left foot: Normal.     Comments: Inspection: no swelling, obvious deformity, or redness to right foot or ankle Palpation: Right foot and ankle tender to palpation diffusely; no obvious deformities palpated ROM: Full ROM to ankle and flexibility of foot  Strength: 5/5 bilateral lower extremities Neurovascular: neurovascularly intact in left and right lower extremity   Skin:    General: Skin is warm and dry.     Capillary Refill:  Capillary refill takes less than 2 seconds.     Coloration: Skin is not jaundiced or pale.     Findings: No erythema.  Neurological:     Mental Status: She is alert and oriented to person, place, and time.  Psychiatric:        Behavior: Behavior is cooperative.      UC Treatments / Results  Labs (all labs ordered are listed, but only abnormal results are displayed) Labs Reviewed - No data to display  EKG   Radiology DG Foot Complete Right  Result Date: 02/09/2022 CLINICAL DATA:  Lateral pain post exercise EXAM: RIGHT FOOT COMPLETE - 3+ VIEW COMPARISON:  None Available. FINDINGS: Hallux valgus deformity. No fracture or dislocation. Normal mineralization. No radiodense foreign body. Calcaneal spurs. Regional soft tissues unremarkable. IMPRESSION: 1. No acute findings. 2. Degenerative changes as above Electronically Signed   By: Lucrezia Europe M.D.   On: 02/09/2022 16:47   DG Ankle Complete Right  Result Date: 02/09/2022 CLINICAL DATA:  Pain after exercise EXAM: RIGHT ANKLE - COMPLETE 3+ VIEW COMPARISON:  None Available. FINDINGS: No fracture or dislocation. Ankle mortise intact. Normal mineralization and alignment. Calcaneal spurs. IMPRESSION: 1. No acute findings. 2. Calcaneal spurs. Electronically Signed   By: Lucrezia Europe M.D.   On: 02/09/2022 16:44    Procedures Procedures (including critical care time)  Medications Ordered in UC Medications - No data to display  Initial Impression / Assessment and Plan / UC Course  I have reviewed the triage vital signs and the nursing notes.  Pertinent labs & imaging results that were available during my care of the patient were reviewed by me and considered in my medical decision making (see chart for details).    Patient is a very  pleasant, well-appearing 71 year old female presenting for right foot and ankle pain after exercising.  Foot and ankle x-rays today are negative for any acute fracture.  We discussed that there are incidental findings  of heel spurs.  Encouraged rest, ice, compression, elevation as needed to help with pain.  Follow-up with primary care provider if symptoms persist or worsen despite treatment. Final Clinical Impressions(s) / UC Diagnoses   Final diagnoses:  Acute right ankle pain  Right foot pain     Discharge Instructions      - The ankle and foot x-rays today are negative for any acute fracture -Please rest your foot and ankle, keep it elevated when sitting, use a compression wrap, and use ice as needed for pain -Follow-up with your primary care provider if your symptoms persist or worsen for more than 1 to 2 weeks without improvement despite this treatment     ED Prescriptions   None    PDMP not reviewed this encounter.   [name], NP 02/09/22 [phone]

## 2022-02-09 NOTE — Progress Notes (Signed)
[facility] PREP Weekly Session  Patient Details  Name: [name] MRN: [id number] Date of Birth: [birth year] Age: 71 y.o. PCP: Kathyrn Drown, MD  Vitals:   02/09/22 1400  Weight: 258 lb 8 oz (117.3 kg)     [facility] Weekly seesion - 02/09/22 1600       [facility] "PREP" Location   [facility] "PREP" Location [facility]      Weekly Session   Topic Discussed Hitting roadblocks    Minutes exercised this week 245 minutes    Classes attended to date 72           C/o right foot pain. Started yesterday after being on the seated elliptical.  Pt sts will go to urgent care to check out after class   Barnett Hatter 02/09/2022, 4:47 PM

## 2022-02-09 NOTE — Discharge Instructions (Signed)
-   The ankle and foot x-rays today are negative for any acute fracture -Please rest your foot and ankle, keep it elevated when sitting, use a compression wrap, and use ice as needed for pain -Follow-up with your primary care provider if your symptoms persist or worsen for more than 1 to 2 weeks without improvement despite this treatment

## 2022-02-19 NOTE — Progress Notes (Signed)
[facility] PREP Evaluation  Patient Details  Name: [name] MRN: [id number] Date of Birth: 1950/11/13 Age: 71 y.o. PCP: Kathyrn Drown, MD  Vitals:   02/18/22 1400  BP: 124/78  Pulse: 69  SpO2: 95%  Weight: 258 lb 6.4 oz (117.2 kg)     [facility] Eval - 02/19/22 1500       [facility] "PREP" Location   [facility] "PREP" Location [facility]      Referral    Program Start Date 02/18/22   PREP final measurement date     Measurement   Waist Circumference 49.5 inches    Hip Circumference 56 inches      Information for Trainer   Goals Reset      Mobility and Daily Activities   I find it easy to walk up or down two or more flights of stairs. 1    I have no trouble taking out the trash. 4    I do housework such as vacuuming and dusting on my own without difficulty. 3    I can easily lift a gallon of milk (8lbs). 4    I can easily walk a mile. 3    I have no trouble reaching into high cupboards or reaching down to pick up something from the floor. 1    I do not have trouble doing out-door work such as Armed forces logistics/support/administrative officer, raking leaves, or gardening. 4      Mobility and Daily Activities   I feel younger than my age. 2    I feel independent. 3    I feel energetic. 2    I live an active life.  2    I feel strong. 2    I feel healthy. 1    I feel active as other people my age. 1      How fit and strong are you.   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 Lovena Le   Sleep apnea    Past Surgical History:  Procedure Laterality Date   ABDOMINAL HYSTERECTOMY     APPENDECTOMY     BREAST BIOPSY Left 2011   BREAST BIOPSY  2008   BREAST LUMPECTOMY Left 2008   CHOLECYSTECTOMY     COLONOSCOPY N/A 09/13/2013   Procedure: COLONOSCOPY;  Surgeon: [name], MD;  [facility];  Service: Endoscopy;  Laterality: N/A;  730   EP IMPLANTABLE DEVICE N/A 01/06/2016   Procedure: Pacemaker Implant;  Surgeon: [name], MD;  Location: [place] [facility];  Service: Cardiovascular;  Laterality: N/A;   ESOPHAGOGASTRODUODENOSCOPY N/A 01/17/2014   Procedure: ESOPHAGOGASTRODUODENOSCOPY (EGD);  Surgeon: [name], MD;  [facility];  Service: Endoscopy;  Laterality: N/A;  245   RIGHT/LEFT HEART CATH AND CORONARY ANGIOGRAPHY N/A 01/01/2020   Procedure: RIGHT/LEFT HEART CATH AND CORONARY ANGIOGRAPHY;  Surgeon: [name], MD;  Location: [place] [facility];  Service: Cardiovascular;  Laterality: N/A;   TOTAL KNEE ARTHROPLASTY Bilateral right knee   2012, 2007   Social History   Tobacco Use  Smoking Status Never  Smokeless Tobacco Never  Cardio march: 150 to 240 Sit to stand: 10 to 10 Bicep curl: 15 to 20 Continue to work on balance and strength training  Barnett Hatter 02/19/2022, 3:32 PM

## 2022-02-26 ENCOUNTER — Other Ambulatory Visit: Payer: Self-pay | Admitting: Family Medicine

## 2022-02-26 DIAGNOSIS — Z1231 Encounter for screening mammogram for malignant neoplasm of breast: Secondary | ICD-10-CM

## 2022-03-01 ENCOUNTER — Encounter: Payer: Self-pay | Admitting: Family Medicine

## 2022-03-01 ENCOUNTER — Ambulatory Visit (INDEPENDENT_AMBULATORY_CARE_PROVIDER_SITE_OTHER): Payer: PPO | Admitting: Family Medicine

## 2022-03-01 ENCOUNTER — Encounter (INDEPENDENT_AMBULATORY_CARE_PROVIDER_SITE_OTHER): Payer: Self-pay | Admitting: *Deleted

## 2022-03-01 VITALS — BP 114/74 | HR 61 | Temp 97.7°F | Ht 64.0 in | Wt 264.4 lb

## 2022-03-01 DIAGNOSIS — I10 Essential (primary) hypertension: Secondary | ICD-10-CM

## 2022-03-01 NOTE — Patient Instructions (Signed)
Hi Deb Awesome job! Keep up the good work [employer] and regular physical activity We will see you by springtime Please remember to do your flu shot this fall as well as the updated COVID-vaccine when it comes out in October  TakeCare-Dr. Nicki Reaper

## 2022-03-01 NOTE — Progress Notes (Signed)
   Subjective:    Patient ID: [name], female    DOB: 1950-10-24, 71 y.o.   MRN: [id number]  HPI 6 month follow up on chronic medical conditions Overall she is doing fairly well She is taking her cholesterol medicine regular basis Watches her diet fairly closely Been exercising been able to lose some weight  Pain in bottom of right foot    Review of Systems     Objective:   Physical Exam General-in no acute distress Eyes-no discharge Lungs-respiratory rate normal, CTA CV-no murmurs,RRR Extremities skin warm dry no edema Neuro grossly normal Behavior normal, alert        Assessment & Plan:  1. Essential hypertension Blood pressure good control watch diet closely stay active-keeping blood pressure under control just through healthy diet regular activity  2. Morbid obesity ([place]) Doing a good job losing weight watching diet closely exercising regular basis  Follow up in 6 months Hyperlipidemia taking her medicine doing well

## 2022-03-05 DIAGNOSIS — G4733 Obstructive sleep apnea (adult) (pediatric): Secondary | ICD-10-CM | POA: Diagnosis not present

## 2022-03-08 ENCOUNTER — Encounter (INDEPENDENT_AMBULATORY_CARE_PROVIDER_SITE_OTHER): Payer: Self-pay | Admitting: *Deleted

## 2022-03-09 ENCOUNTER — Encounter: Payer: Self-pay | Admitting: Family Medicine

## 2022-03-18 ENCOUNTER — Encounter (INDEPENDENT_AMBULATORY_CARE_PROVIDER_SITE_OTHER): Payer: PPO | Admitting: Ophthalmology

## 2022-03-25 DIAGNOSIS — F4323 Adjustment disorder with mixed anxiety and depressed mood: Secondary | ICD-10-CM | POA: Diagnosis not present

## 2022-03-30 ENCOUNTER — Ambulatory Visit: Payer: PPO

## 2022-04-01 ENCOUNTER — Ambulatory Visit (INDEPENDENT_AMBULATORY_CARE_PROVIDER_SITE_OTHER): Payer: PPO

## 2022-04-01 DIAGNOSIS — I495 Sick sinus syndrome: Secondary | ICD-10-CM | POA: Diagnosis not present

## 2022-04-01 LAB — CUP PACEART REMOTE DEVICE CHECK
Date Time Interrogation Session: 20230921082912
Implantable Lead Implant Date: 20170627
Implantable Lead Implant Date: 20170627
Implantable Lead Location: 753859
Implantable Lead Location: 753860
Implantable Lead Model: 377
Implantable Lead Model: 377
Implantable Lead Serial Number: 49436227
Implantable Lead Serial Number: 49471106
Implantable Pulse Generator Implant Date: 20170627
Pulse Gen Model: 394969
Pulse Gen Serial Number: 68786455

## 2022-04-08 ENCOUNTER — Other Ambulatory Visit: Payer: Self-pay

## 2022-04-08 ENCOUNTER — Encounter: Payer: Self-pay | Admitting: Emergency Medicine

## 2022-04-08 ENCOUNTER — Ambulatory Visit (INDEPENDENT_AMBULATORY_CARE_PROVIDER_SITE_OTHER): Payer: PPO

## 2022-04-08 ENCOUNTER — Ambulatory Visit
Admission: EM | Admit: 2022-04-08 | Discharge: 2022-04-08 | Disposition: A | Discharge status: Home / Self Care | Payer: PPO | Attending: Family Medicine | Admitting: Family Medicine

## 2022-04-08 DIAGNOSIS — M25551 Pain in right hip: Secondary | ICD-10-CM | POA: Diagnosis not present

## 2022-04-08 DIAGNOSIS — W19XXXA Unspecified fall, initial encounter: Secondary | ICD-10-CM | POA: Diagnosis not present

## 2022-04-08 NOTE — ED Triage Notes (Signed)
Pt reports right hip pain since fall on Tuesday. Pt reports was getting up brush in the yard and reports tripped over limb and landed on right hip. Denies being on blood thinner, loc, hitting head.

## 2022-04-10 NOTE — ED Provider Notes (Signed)
[facility]    CSN: [id number] Arrival date & time: 04/08/22  1623      History   Chief Complaint Chief Complaint  Patient presents with   Fall    HPI [name] is a 71 y.o. female.   Patient presenting today with right posterior hip/buttock pain following a fall 2 days ago in the yard. States she fell forward initially, thinks maybe she then rolled onto her right side after the initial fall which caused the pain on the right side. She denies head injury, LOC, dizziness, N/V, other areas of significant pain, decreased ROM, numbness, tingling. Not on blood thinning agents. So far trying OTC pain relievers with minimal relief.     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   Essential hypertension 03/06/2020   Prediabetes 02/10/2020   Cardiac pacemaker in situ 01/07/2020   At risk  for central sleep apnea 01/07/2020   Obesity with alveolar hypoventilation and body mass index (BMI) of 40 or greater ([place]) 01/07/2020   DOE (dyspnea on exertion) 12/05/2019   Chronic diastolic CHF (congestive heart failure) ([place]) 12/05/2019   Memory loss 12/12/2017   Morbid obesity ([place]) 09/05/2017   Sinus node dysfunction ([place]) 01/06/2016   Bipolar disorder ([place]) 10/22/2015   Anxiety 10/22/2015   Episodic lightheadedness    GERD (gastroesophageal reflux disease) 01/08/2014   Osteoarthritis 11/08/2013   Lumbar back pain 11/08/2013   History of left breast cancer (2013) 07/22/2011    Past Surgical History:  Procedure Laterality Date   ABDOMINAL HYSTERECTOMY     APPENDECTOMY     BREAST BIOPSY Left 2011   BREAST BIOPSY  2008   BREAST LUMPECTOMY Left 2008   CHOLECYSTECTOMY     COLONOSCOPY N/A 09/13/2013   Procedure: COLONOSCOPY;  Surgeon: [name], MD;  [facility];  Service: Endoscopy;  Laterality: N/A;  730   EP IMPLANTABLE DEVICE N/A 01/06/2016   Procedure: Pacemaker Implant;  Surgeon: [name], MD;  Location: [place] [facility];  Service: Cardiovascular;  Laterality: N/A;   ESOPHAGOGASTRODUODENOSCOPY N/A 01/17/2014   Procedure: ESOPHAGOGASTRODUODENOSCOPY (EGD);  Surgeon: [name], MD;  [facility];  Service: Endoscopy;  Laterality: N/A;  245   RIGHT/LEFT HEART CATH AND CORONARY ANGIOGRAPHY N/A 01/01/2020  Procedure: RIGHT/LEFT HEART CATH AND CORONARY ANGIOGRAPHY;  Surgeon: [name], MD;  Location: [place] [facility];  Service: Cardiovascular;  Laterality: N/A;   TOTAL KNEE ARTHROPLASTY Bilateral right knee   2012, 2007    OB History   No obstetric history on file.      Home Medications    Prior to Admission medications   Medication Sig Start Date End Date Taking? Authorizing Provider  acetaminophen (TYLENOL) 650 MG CR tablet Take 650-1,300 mg by mouth See admin instructions. '1300MG'$  IN THE MORNING AND '650MG'$  AT BEDTIME     [provider]  ALPRAZolam (XANAX XR) 1 MG 24 hr tablet Take 1 mg by mouth in the morning and at bedtime.    [provider]  ALPRAZolam [name]) 1 MG tablet Take 0.5 mg by mouth daily as needed for anxiety.     [provider]  aspirin EC 81 MG tablet Take 1 tablet (81 mg total) by mouth daily. Swallow whole. 02/04/22   [name], MD  Cholecalciferol (VITAMIN D3) 1.25 MG (50000 UT) CAPS Take 1 capsule by mouth once a week. 08/07/21   [provider]  glucosamine-chondroitin 500-400 MG tablet Take 2 tablets by mouth every morning.     [provider]  lamoTRIgine (LAMICTAL) 200 MG tablet Take 400 mg by mouth at bedtime.    [provider]  Lutein 20 MG CAPS  01/28/21   [provider]  Multiple Vitamin (MULTIVITAMIN) tablet Take 1 tablet by mouth daily.    [provider]  QUEtiapine (SEROQUEL) 200 MG tablet Take 600 mg by mouth at bedtime. 05/07/13   [name], MD  rosuvastatin (CRESTOR) 10 MG tablet TAKE 1 TABLET(10 MG) BY MOUTH DAILY 02/02/22   [name], MD    Family History Family History  Problem Relation Age of Onset   Bipolar disorder Mother    Diabetes Mother    Bipolar disorder Sister    Diabetes Sister    Alcohol abuse Father    Hypertension Father    Heart disease Other     Social History Social History   Tobacco Use   Smoking status: Never   Smokeless tobacco: Never  Vaping Use   Vaping Use: Never used  Substance Use Topics   Alcohol use: No    Alcohol/week: 0.0 standard drinks of alcohol   Drug use: No     Allergies   Imipramine and Tricor [fenofibrate]   Review of Systems Review of Systems PER HPI  Physical Exam Triage Vital Signs ED Triage Vitals [04/08/22 1717]  Enc Vitals Group     BP 136/87     Pulse Rate 73     Resp 20     Temp 97.9 F (36.6 C)     Temp Source Oral     SpO2 91 %     Weight      Height      Head Circumference      Peak  Flow      Pain Score 3     Pain Loc      Pain Edu?      Excl. in [place]?    No data found.  Updated Vital Signs BP 136/87 (BP Location: Right Arm)   Pulse 73   Temp 97.9 F (36.6 C) (Oral)   Resp 20   SpO2 91%   Visual Acuity Right Eye Distance:   Left Eye Distance:   Bilateral Distance:  Right Eye Near:   Left Eye Near:    Bilateral Near:     Physical Exam Vitals and nursing note reviewed.  Constitutional:      Appearance: Normal appearance. She is not ill-appearing.  HENT:     Head: Atraumatic.  Eyes:     Extraocular Movements: Extraocular movements intact.     Conjunctiva/sclera: Conjunctivae normal.  Cardiovascular:     Rate and Rhythm: Normal rate and regular rhythm.     Heart sounds: Normal heart sounds.  Pulmonary:     Effort: Pulmonary effort is normal.     Breath sounds: Normal breath sounds.  Musculoskeletal:        General: Tenderness and signs of injury present. No swelling or deformity. Normal range of motion.     Cervical back: Normal range of motion and neck supple.     Comments: Ttp right posterior buttock, nontender to lateral or anterior hip. ROM right hip fully intact, normal gait  Skin:    General: Skin is warm and dry.  Neurological:     Mental Status: She is alert and oriented to person, place, and time.     Motor: No weakness.     Gait: Gait normal.     Comments: B/l LEs neurovascularly intact  Psychiatric:        Mood and Affect: Mood normal.        Thought Content: Thought content normal.        Judgment: Judgment normal.      UC Treatments / Results  Labs (all labs ordered are listed, but only abnormal results are displayed) Labs Reviewed - No data to display  EKG   Radiology DG Hip Unilat With Pelvis 2-3 Views Right  Result Date: 04/08/2022 CLINICAL DATA:  Right hip pain, fall EXAM: DG HIP (WITH OR WITHOUT PELVIS) 2-3V RIGHT COMPARISON:  None Available. FINDINGS: There is no evidence of hip fracture or dislocation. There  is no evidence of arthropathy or other focal bone abnormality. IMPRESSION: Negative. Electronically Signed   By: Rolm Baptise M.D.   On: 04/08/2022 18:13    Procedures Procedures (including critical care time)  Medications Ordered in UC Medications - No data to display  Initial Impression / Assessment and Plan / UC Course  I have reviewed the triage vital signs and the nursing notes.  Pertinent labs & imaging results that were available during my care of the patient were reviewed by me and considered in my medical decision making (see chart for details).     Exam very reassuring for muscular pain, patient requesting x-ray for reassurance which was neg for acute bony abnormality. Discussed heat, stretches, OTC pain relievers, rest. Return for worsening sxs.  Final Clinical Impressions(s) / UC Diagnoses   Final diagnoses:  Right hip pain   Discharge Instructions   None    ED Prescriptions   None    PDMP not reviewed this encounter.   Volney American, [place] 04/10/22 0710

## 2022-04-12 NOTE — Progress Notes (Signed)
Remote pacemaker transmission.   

## 2022-04-26 ENCOUNTER — Ambulatory Visit
Admission: RE | Admit: 2022-04-26 | Discharge: 2022-04-26 | Disposition: A | Discharge status: Home / Self Care | Payer: PPO | Source: Ambulatory Visit | Attending: Family Medicine | Admitting: Family Medicine

## 2022-04-26 DIAGNOSIS — Z1231 Encounter for screening mammogram for malignant neoplasm of breast: Secondary | ICD-10-CM | POA: Diagnosis not present

## 2022-04-30 ENCOUNTER — Encounter (INDEPENDENT_AMBULATORY_CARE_PROVIDER_SITE_OTHER): Payer: Self-pay | Admitting: Gastroenterology

## 2022-05-22 ENCOUNTER — Encounter (INDEPENDENT_AMBULATORY_CARE_PROVIDER_SITE_OTHER): Payer: Self-pay | Admitting: Gastroenterology

## 2022-05-24 ENCOUNTER — Telehealth: Payer: PPO | Admitting: Physician Assistant

## 2022-05-24 DIAGNOSIS — H00014 Hordeolum externum left upper eyelid: Secondary | ICD-10-CM | POA: Diagnosis not present

## 2022-05-24 MED ORDER — ERYTHROMYCIN 5 MG/GM OP OINT: 1.0000 | TOPICAL_OINTMENT | Freq: Two times a day (BID) | OPHTHALMIC | 0 refills | Status: DC

## 2022-05-24 NOTE — Progress Notes (Signed)
  E-Visit for Stye   We are sorry that you are not feeling well. Here is how we plan to help!  Based on what you have shared with me it looks like you have a stye.  A stye is an inflammation of the eyelid.  It is often a red, painful lump near the edge of the eyelid that may look like a boil or a pimple.  A stye develops when an infection occurs at the base of an eyelash.   We have made appropriate suggestions for you based upon your presentation: Simple styes can be treated without medical intervention.  Most styes either resolve spontaneously or resolve with simple home treatment by applying warm compresses or heated washcloth to the stye for about 10-15 minutes three to four times a day. This causes the stye to drain and resolve. I have prescribed erythromycin ointment to apply to the left upper eyelid twice daily for 5 days. I will let you know this may not be effective since it has been present for 2 weeks. When they are present this long, sometimes it requires seeing an eye doctor to have the stye taken care of. If this does not help, please seek in person care.   HOME CARE:  Wash your hands often! Let the stye open on its own. Don't squeeze or open it. Don't rub your eyes. This can irritate your eyes and let in bacteria.  If you need to touch your eyes, wash your hands first. Don't wear eye makeup or contact lenses until the area has healed.  GET HELP RIGHT AWAY IF:  Your symptoms do not improve. You develop blurred or loss of vision. Your symptoms worsen (increased discharge, pain or redness).   Thank you for choosing an e-visit.  Your e-visit answers were reviewed by a board certified advanced clinical practitioner to complete your personal care plan. Depending upon the condition, your plan could have included both over the counter or prescription medications.  Please review your pharmacy choice. Make sure the pharmacy is open so you can pick up prescription now. If there is a  problem, you may contact your provider through [employer] and have the prescription routed to another pharmacy.  Your safety is important to [place]. If you have drug allergies check your prescription carefully.   For the next 24 hours you can use MyChart to ask questions about today's visit, request a non-urgent call back, or ask for a work or school excuse. You will get an email in the next two days asking about your experience. I hope that your e-visit has been valuable and will speed your recovery.  I have spent 5 minutes in review of e-visit questionnaire, review and updating patient chart, medical decision making and response to patient.   Mar Daring, PA-C

## 2022-05-25 ENCOUNTER — Ambulatory Visit (INDEPENDENT_AMBULATORY_CARE_PROVIDER_SITE_OTHER): Payer: PPO | Admitting: Gastroenterology

## 2022-05-26 ENCOUNTER — Ambulatory Visit (INDEPENDENT_AMBULATORY_CARE_PROVIDER_SITE_OTHER): Payer: PPO | Admitting: Internal Medicine

## 2022-05-26 ENCOUNTER — Encounter: Payer: Self-pay | Admitting: Internal Medicine

## 2022-05-26 VITALS — BP 137/77 | HR 75 | Temp 97.3°F | Ht 64.0 in | Wt 258.1 lb

## 2022-05-26 DIAGNOSIS — K5904 Chronic idiopathic constipation: Secondary | ICD-10-CM | POA: Diagnosis not present

## 2022-05-26 DIAGNOSIS — D125 Benign neoplasm of sigmoid colon: Secondary | ICD-10-CM

## 2022-05-26 DIAGNOSIS — K219 Gastro-esophageal reflux disease without esophagitis: Secondary | ICD-10-CM

## 2022-05-26 NOTE — Patient Instructions (Signed)
We will schedule you for colonoscopy given your history of polyps prior.  For your constipation, I want you to start taking over the counter MiraLAX 1 capful daily.  If this does not adequately control your constipation, I would increase to 2 capfuls daily.  If this is still not adequate, then I would add on once daily Dulcolax (bisacodyl) tablet.   I also recommend increasing fiber in your diet or adding OTC Benefiber/Metamucil. Be sure to drink at least 4 to 6 glasses of water daily.   It was very nice meeting you today.  Dr. Abbey Chatters

## 2022-05-26 NOTE — Progress Notes (Addendum)
Primary Care Physician:  Kathyrn Drown, MD Primary Gastroenterologist:  Dr. Abbey Chatters  Chief Complaint  Patient presents with   Constipation    Referred for constipation. Started eating oatmeal daily and it has seemed to help. Would like to schedule colonoscopy.     HPI:   [name] is a 71 y.o. female who presents to the clinic today by referral from her PCP Dr. Wolfgang Phoenix to discuss colonoscopy.  Last colonoscopy 2015 with 2 polyps removed, 1 tubular adenoma in the sigmoid colon.  Recommended recall 5 years.  There is family history of colon cancer in her sister who has a colostomy bag.  States she was diagnosed in her 11s.  Patient denies any melena hematochezia.  No abdominal pain.  Does note 25 pound weight loss which has been intentional.  Does note mild constipation.  Usually has a bowel movement daily though sometimes small stool balls.  Some straining.  No rectal bleeding.  Has increased oatmeal in the a.m. which is helped some.  Mild intermittent reflux.  Previously on pantoprazole though states this interacts with other medications.  Now treats her symptoms with diet.  Elevates bed at home.  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 Lovena Le   Sleep apnea     Past Surgical History:  Procedure Laterality Date   ABDOMINAL HYSTERECTOMY     APPENDECTOMY     BREAST BIOPSY Left 2011   BREAST BIOPSY  2008   BREAST LUMPECTOMY Left 2008    CHOLECYSTECTOMY     COLONOSCOPY N/A 09/13/2013   Procedure: COLONOSCOPY;  Surgeon: [name], MD;  [facility];  Service: Endoscopy;  Laterality: N/A;  730   EP IMPLANTABLE DEVICE N/A 01/06/2016   Procedure: Pacemaker Implant;  Surgeon: [name], MD;  Location: [facility];  Service: Cardiovascular;  Laterality: N/A;   ESOPHAGOGASTRODUODENOSCOPY N/A 01/17/2014   Procedure: ESOPHAGOGASTRODUODENOSCOPY (EGD);  Surgeon: [name], MD;  [facility];  Service: Endoscopy;  Laterality: N/A;  245   RIGHT/LEFT HEART CATH AND CORONARY ANGIOGRAPHY N/A 01/01/2020   Procedure: RIGHT/LEFT HEART CATH AND CORONARY ANGIOGRAPHY;  Surgeon: [name], MD;  Location: [facility];  Service: Cardiovascular;  Laterality: N/A;   TOTAL KNEE ARTHROPLASTY Bilateral right knee   2012, 2007    Current Outpatient Medications  Medication Sig Dispense Refill   acetaminophen (TYLENOL) 650 MG CR tablet Take 650-1,300 mg by mouth See admin instructions. '1300MG'$  IN THE MORNING AND '650MG'$  AT BEDTIME     ALPRAZolam (XANAX XR) 1 MG 24 hr tablet Take 1 mg by mouth in the morning and at bedtime.     ALPRAZolam (XANAX) 1 MG tablet Take 0.5 mg by mouth daily as needed for anxiety.      aspirin EC 81 MG tablet  Take 1 tablet (81 mg total) by mouth daily. Swallow whole. 90 tablet 3   Cholecalciferol (VITAMIN D3) 1.25 MG (50000 UT) CAPS Take 1 capsule by mouth once a week.     erythromycin ophthalmic ointment Place 1 Application into the left eye in the morning and at bedtime. 10 g 0   glucosamine-chondroitin 500-400 MG tablet Take 2 tablets by mouth every morning.      lamoTRIgine (LAMICTAL) 200 MG tablet Take 400 mg by mouth at bedtime.     Lutein 20 MG CAPS      Multiple Vitamin (MULTIVITAMIN) tablet Take 1 tablet by mouth daily.     QUEtiapine (SEROQUEL) 200 MG tablet Take 600 mg by mouth at bedtime.     rosuvastatin (CRESTOR) 10 MG tablet TAKE 1 TABLET(10 MG) BY MOUTH  DAILY 90 tablet 3   No current facility-administered medications for this visit.    Allergies as of 05/26/2022 - Review Complete 05/26/2022  Allergen Reaction Noted   Imipramine Hives and Rash 07/22/2011   Tricor [fenofibrate] Other (See Comments) 07/30/2013    Family History  Problem Relation Age of Onset   Breast cancer Mother    Bipolar disorder Mother    Diabetes Mother    Alcohol abuse Father    Hypertension Father    Bipolar disorder Sister    Diabetes Sister    Heart disease Other     Social History   Socioeconomic History   Marital status: Married    Spouse name: Not on file   Number of children: 3   Years of education: college   Highest education level: Associate degree: occupational, Hotel manager, or vocational program  Occupational History   Occupation: Therapist, sports  Tobacco Use   Smoking status: Never    Passive exposure: Never   Smokeless tobacco: Never  Vaping Use   Vaping Use: Never used  Substance and Sexual Activity   Alcohol use: No    Alcohol/week: 0.0 standard drinks of alcohol   Drug use: No   Sexual activity: Not Currently  Other Topics Concern   Not on file  Social History Narrative   04/24/2013 [name] was born and grew up in [place]. She reports that her childhood was "tough." She has 2 older sisters and a younger brother. She achieved an Associates Degree in nursing. 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 She has 2 daughters and one son. She denies any legal difficulties. She affiliates as Engineer, manufacturing. Her hobbies include reading, sewing, crafts, and cooking. She reports that her social support system consists of her friend and passed her. 04/24/2013 [name]      Lives alone.   Right-handed.   No caffeine use.   Social Determinants of Health   Financial Resource Strain: Low Risk  (09/01/2021)   Overall Financial Resource Strain (CARDIA)    Difficulty of Paying Living Expenses: Not hard at  all  Food Insecurity: No Food Insecurity (09/01/2021)   Hunger Vital Sign    Worried About Running Out of Food in the Last Year: Never true    Ran Out of Food in the Last Year: Never true  Transportation Needs: No Transportation Needs (09/01/2021)   PRAPARE - Hydrologist (Medical): No    Lack of Transportation (Non-Medical): No  Physical Activity: Sufficiently Active (09/01/2021)   Exercise Vital Sign    Days of Exercise per Week: 4 days  Minutes of Exercise per Session: 50 min  Stress: No Stress Concern Present (09/01/2021)   [place]    Feeling of Stress : Only a little  Social Connections: Socially Integrated (09/01/2021)   Social Connection and Isolation Panel [NHANES]    Frequency of Communication with Friends and Family: More than three times a week    Frequency of Social Gatherings with Friends and Family: More than three times a week    Attends Religious Services: More than 4 times per year    Active Member of [employer] or Organizations: Yes    Attends Music therapist: More than 4 times per year    Marital Status: Married  Human resources officer Violence: Not At Risk (09/01/2021)   Humiliation, Afraid, Rape, and Kick questionnaire    Fear of Current or Ex-Partner: No    Emotionally Abused: No    Physically Abused: No    Sexually Abused: No    Subjective: Review of Systems  Constitutional:  Negative for chills and fever.  HENT:  Negative for congestion and hearing loss.   Eyes:  Negative for blurred vision and double vision.  Respiratory:  Negative for cough and shortness of breath.   Cardiovascular:  Negative for chest pain and palpitations.  Gastrointestinal:  Negative for abdominal pain, blood in stool, constipation, diarrhea, heartburn, melena and vomiting.  Genitourinary:  Negative for dysuria and urgency.  Musculoskeletal:  Negative for joint pain and myalgias.   Skin:  Negative for itching and rash.  Neurological:  Negative for dizziness and headaches.  Psychiatric/Behavioral:  Negative for depression. The patient is not nervous/anxious.        Objective: BP 137/77 (BP Location: Left Arm, Patient Position: Sitting, Cuff Size: Large)   Pulse 75   Temp (!) 97.3 F (36.3 C) (Oral)   Ht '5\' 4"'$  (1.626 m)   Wt 258 lb 1.6 oz (117.1 kg)   BMI 44.30 kg/m  Physical Exam Constitutional:      Appearance: Normal appearance.  HENT:     Head: Normocephalic and atraumatic.  Eyes:     Extraocular Movements: Extraocular movements intact.     Conjunctiva/sclera: Conjunctivae normal.  Cardiovascular:     Rate and Rhythm: Normal rate and regular rhythm.  Pulmonary:     Effort: Pulmonary effort is normal.     Breath sounds: Normal breath sounds.  Abdominal:     General: Bowel sounds are normal.     Palpations: Abdomen is soft.  Musculoskeletal:        General: No swelling. Normal range of motion.     Cervical back: Normal range of motion and neck supple.  Skin:    General: Skin is warm and dry.     Coloration: Skin is not jaundiced.  Neurological:     General: No focal deficit present.     Mental Status: She is alert and oriented to person, place, and time.  Psychiatric:        Mood and Affect: Mood normal.        Behavior: Behavior normal.      Assessment: *Adenomatous colon polyps  *Constipation-mild *GERD-mild intermittent  Plan: Will schedule for surveillance colonoscopy.The risks including infection, bleed, or perforation as well as benefits, limitations, alternatives and imponderables have been reviewed with the patient. Questions have been answered. All parties agreeable.  For patient's constipation, I recommended taking MiraLAX 1 capful daily.  If this is not adequate then increase to 2 capfuls daily.  If this is still not adequate then add on once daily Dulcolax.  Also recommended patient start taking fiber therapy.  Print out  given to patient today.  Encouraged to drink at least 6 glasses of water daily.  GERD well-controlled with diet.  Previously on pantoprazole though states this interacts with medications.  Continue to monitor.  No alarm symptoms today to warrant further investigation with upper endoscopy. 05/26/2022 2:19 PM   Disclaimer: This note was dictated with voice recognition software. Similar sounding words can inadvertently be transcribed and may not be corrected upon review.

## 2022-06-07 DIAGNOSIS — G4733 Obstructive sleep apnea (adult) (pediatric): Secondary | ICD-10-CM | POA: Diagnosis not present

## 2022-06-10 ENCOUNTER — Telehealth (INDEPENDENT_AMBULATORY_CARE_PROVIDER_SITE_OTHER): Payer: Self-pay | Admitting: *Deleted

## 2022-06-10 NOTE — Telephone Encounter (Signed)
LMOVM to call back to schedule TCS with Dr. Abbey Chatters, ASA 3 after christmas per pt request

## 2022-06-14 NOTE — Telephone Encounter (Signed)
Letter mailed

## 2022-06-25 NOTE — Telephone Encounter (Signed)
VM from pt. Called back and LMOVM to call back

## 2022-06-28 ENCOUNTER — Telehealth: Payer: Self-pay | Admitting: Internal Medicine

## 2022-06-28 DIAGNOSIS — H2000 Unspecified acute and subacute iridocyclitis: Secondary | ICD-10-CM | POA: Diagnosis not present

## 2022-06-28 DIAGNOSIS — H5711 Ocular pain, right eye: Secondary | ICD-10-CM | POA: Diagnosis not present

## 2022-06-28 DIAGNOSIS — S0511XA Contusion of eyeball and orbital tissues, right eye, initial encounter: Secondary | ICD-10-CM | POA: Diagnosis not present

## 2022-06-28 NOTE — Telephone Encounter (Signed)
The patient left a vm that she was sorry she missed your call.

## 2022-06-28 NOTE — Telephone Encounter (Signed)
Noted. Will call pt

## 2022-07-01 ENCOUNTER — Ambulatory Visit (INDEPENDENT_AMBULATORY_CARE_PROVIDER_SITE_OTHER): Payer: PPO

## 2022-07-01 ENCOUNTER — Encounter (INDEPENDENT_AMBULATORY_CARE_PROVIDER_SITE_OTHER): Payer: PPO | Admitting: Ophthalmology

## 2022-07-01 DIAGNOSIS — I495 Sick sinus syndrome: Secondary | ICD-10-CM | POA: Diagnosis not present

## 2022-07-01 LAB — CUP PACEART REMOTE DEVICE CHECK
Date Time Interrogation Session: 20231221124659
Implantable Lead Connection Status: 753985
Implantable Lead Connection Status: 753985
Implantable Lead Implant Date: 20170627
Implantable Lead Implant Date: 20170627
Implantable Lead Location: 753859
Implantable Lead Location: 753860
Implantable Lead Model: 377
Implantable Lead Model: 377
Implantable Lead Serial Number: 49436227
Implantable Lead Serial Number: 49471106
Implantable Pulse Generator Implant Date: 20170627
Pulse Gen Model: 394969
Pulse Gen Serial Number: 68786455

## 2022-07-02 ENCOUNTER — Encounter: Payer: Self-pay | Admitting: *Deleted

## 2022-07-02 MED ORDER — PEG 3350-KCL-NA BICARB-NACL 420 G PO SOLR: 4000.0000 mL | Freq: Once | ORAL | 0 refills | Status: AC

## 2022-07-02 NOTE — Telephone Encounter (Signed)
Pt has been scheduled for 07/23/22 at 11:30 am. Instructions mailed and prep sent to the pharmacy

## 2022-07-06 ENCOUNTER — Encounter (INDEPENDENT_AMBULATORY_CARE_PROVIDER_SITE_OTHER): Payer: PPO | Admitting: Ophthalmology

## 2022-07-07 DIAGNOSIS — H5711 Ocular pain, right eye: Secondary | ICD-10-CM | POA: Diagnosis not present

## 2022-07-07 DIAGNOSIS — H2000 Unspecified acute and subacute iridocyclitis: Secondary | ICD-10-CM | POA: Diagnosis not present

## 2022-07-07 DIAGNOSIS — S0511XA Contusion of eyeball and orbital tissues, right eye, initial encounter: Secondary | ICD-10-CM | POA: Diagnosis not present

## 2022-07-19 DIAGNOSIS — H33101 Unspecified retinoschisis, right eye: Secondary | ICD-10-CM | POA: Diagnosis not present

## 2022-07-19 DIAGNOSIS — H35351 Cystoid macular degeneration, right eye: Secondary | ICD-10-CM | POA: Diagnosis not present

## 2022-07-19 DIAGNOSIS — H353211 Exudative age-related macular degeneration, right eye, with active choroidal neovascularization: Secondary | ICD-10-CM | POA: Diagnosis not present

## 2022-07-19 DIAGNOSIS — H353132 Nonexudative age-related macular degeneration, bilateral, intermediate dry stage: Secondary | ICD-10-CM | POA: Diagnosis not present

## 2022-07-19 DIAGNOSIS — H34232 Retinal artery branch occlusion, left eye: Secondary | ICD-10-CM | POA: Diagnosis not present

## 2022-07-19 DIAGNOSIS — H35071 Retinal telangiectasis, right eye: Secondary | ICD-10-CM | POA: Diagnosis not present

## 2022-07-19 NOTE — Patient Instructions (Signed)
[name]  07/19/2022     '@PREFPERIOPPHARMACY'$ @   Your procedure is scheduled on  07/23/2022.   Report to [name] at  [facility]   Call this number if you have problems the morning of surgery:  [phone]  If you experience any cold or flu symptoms such as cough, fever, chills, shortness of breath, etc. between now and your scheduled surgery, please notify us at the above number.   Remember:  Follow the diet and prep instructions given to you by the office.     Take these medicines the morning of surgery with A SIP OF WATER                                            xanax(if needed).    Do not wear jewelry, make-up or nail polish.  Do not wear lotions, powders, or perfumes, or deodorant.  Do not shave 48 hours prior to surgery.  Men may shave face and neck.  Do not bring valuables to the hospital.  [facility] is not responsible for any belongings or valuables.  Contacts, dentures or bridgework may not be worn into surgery.  Leave your suitcase in the car.  After surgery it may be brought to your room.  For patients admitted to the hospital, discharge time will be determined by your treatment team.  Patients discharged the day of surgery will not be allowed to drive home and must have someone with them for 24 hours.    Special instructions:   DO NOT smoke tobacco or vape for 24 hours before your procedure.  Please read over the following fact sheets that you were given. Anesthesia Post-op Instructions and Care and Recovery After Surgery      Colonoscopy, Adult, Care After The following information offers guidance on how to care for yourself after your procedure. Your health care provider may also give you more specific instructions. If you have problems or questions, contact your health care provider. What can I expect after the procedure? After the procedure, it is common to have: A small amount of blood in your stool for 24 hours after the  procedure. Some gas. Mild cramping or bloating of your abdomen. Follow these instructions at home: Eating and drinking  Drink enough fluid to keep your urine pale yellow. Follow instructions from your health care provider about eating or drinking restrictions. Resume your normal diet as told by your health care provider. Avoid heavy or fried foods that are hard to digest. Activity Rest as told by your health care provider. Avoid sitting for a long time without moving. Get up to take short walks every 1-2 hours. This is important to improve blood flow and breathing. Ask for help if you feel weak or unsteady. Return to your normal activities as told by your health care provider. Ask your health care provider what activities are safe for you. Managing cramping and bloating  Try walking around when you have cramps or feel bloated. If directed, apply heat to your abdomen as told by your health care provider. Use the heat source that your health care provider recommends, such as a moist heat pack or a heating pad. Place a towel between your skin and the heat source. Leave the heat on for 20-30 minutes. Remove the heat if your skin turns bright red. This is especially  important if you are unable to feel pain, heat, or cold. You have a greater risk of getting burned. General instructions If you were given a sedative during the procedure, it can affect you for several hours. Do not drive or operate machinery until your health care provider says that it is safe. For the first 24 hours after the procedure: Do not sign important documents. Do not drink alcohol. Do your regular daily activities at a slower pace than normal. Eat soft foods that are easy to digest. Take over-the-counter and prescription medicines only as told by your health care provider. Keep all follow-up visits. This is important. Contact a health care provider if: You have blood in your stool 2-3 days after the procedure. Get  help right away if: You have more than a small spotting of blood in your stool. You have large blood clots in your stool. You have swelling of your abdomen. You have nausea or vomiting. You have a fever. You have increasing pain in your abdomen that is not relieved with medicine. These symptoms may be an emergency. Get help right away. Call 911. Do not wait to see if the symptoms will go away. Do not drive yourself to the hospital. Summary After the procedure, it is common to have a small amount of blood in your stool. You may also have mild cramping and bloating of your abdomen. If you were given a sedative during the procedure, it can affect you for several hours. Do not drive or operate machinery until your health care provider says that it is safe. Get help right away if you have a lot of blood in your stool, nausea or vomiting, a fever, or increased pain in your abdomen. This information is not intended to replace advice given to you by your health care provider. Make sure you discuss any questions you have with your health care provider. Document Revised: 02/18/2021 Document Reviewed: 02/18/2021 Elsevier Patient Education  [place] After The following information offers guidance on how to care for yourself after your procedure. Your health care provider may also give you more specific instructions. If you have problems or questions, contact your health care provider. What can I expect after the procedure? After the procedure, it is common to have: Tiredness. Little or no memory about what happened during or after the procedure. Impaired judgment when it comes to making decisions. Nausea or vomiting. Some trouble with balance. Follow these instructions at home: For the time period you were told by your health care provider:  Rest. Do not participate in activities where you could fall or become injured. Do not drive or use machinery. Do  not drink alcohol. Do not take sleeping pills or medicines that cause drowsiness. Do not make important decisions or sign legal documents. Do not take care of children on your own. Medicines Take over-the-counter and prescription medicines only as told by your health care provider. If you were prescribed antibiotics, take them as told by your health care provider. Do not stop using the antibiotic even if you start to feel better. Eating and drinking Follow instructions from your health care provider about what you may eat and drink. Drink enough fluid to keep your urine pale yellow. If you vomit: Drink clear fluids slowly and in small amounts as you are able. Clear fluids include water, ice chips, low-calorie sports drinks, and fruit juice that has water added to it (diluted fruit juice). Eat light and bland foods in  small amounts as you are able. These foods include bananas, applesauce, rice, lean meats, toast, and crackers. General instructions  Have a responsible adult stay with you for the time you are told. It is important to have someone help care for you until you are awake and alert. If you have sleep apnea, surgery and some medicines can increase your risk for breathing problems. Follow instructions from your health care provider about wearing your sleep device: When you are sleeping. This includes during daytime naps. While taking prescription pain medicines, sleeping medicines, or medicines that make you drowsy. Do not use any products that contain nicotine or tobacco. These products include cigarettes, chewing tobacco, and vaping devices, such as e-cigarettes. If you need help quitting, ask your health care provider. Contact a health care provider if: You feel nauseous or vomit every time you eat or drink. You feel light-headed. You are still sleepy or having trouble with balance after 24 hours. You get a rash. You have a fever. You have redness or swelling around the IV  site. Get help right away if: You have trouble breathing. You have new confusion after you get home. These symptoms may be an emergency. Get help right away. Call 911. Do not wait to see if the symptoms will go away. Do not drive yourself to the hospital. This information is not intended to replace advice given to you by your health care provider. Make sure you discuss any questions you have with your health care provider. Document Revised: 11/23/2021 Document Reviewed: 11/23/2021 Elsevier Patient Education  [place].

## 2022-07-21 ENCOUNTER — Encounter (HOSPITAL_COMMUNITY)
Admission: RE | Admit: 2022-07-21 | Discharge: 2022-07-21 | Disposition: A | Discharge status: Home / Self Care | Payer: PPO | Source: Ambulatory Visit | Attending: Internal Medicine | Admitting: Internal Medicine

## 2022-07-21 ENCOUNTER — Encounter (HOSPITAL_COMMUNITY): Payer: Self-pay

## 2022-07-21 DIAGNOSIS — Z01812 Encounter for preprocedural laboratory examination: Secondary | ICD-10-CM | POA: Insufficient documentation

## 2022-07-21 DIAGNOSIS — R7303 Prediabetes: Secondary | ICD-10-CM | POA: Diagnosis not present

## 2022-07-21 HISTORY — DX: Bradycardia, unspecified: R00.1

## 2022-07-21 LAB — BASIC METABOLIC PANEL
Anion gap: 9 (ref 5–15)
BUN: 20 mg/dL (ref 8–23)
CO2: 29 mmol/L (ref 22–32)
Calcium: 9.7 mg/dL (ref 8.9–10.3)
Chloride: 100 mmol/L (ref 98–111)
Creatinine, Ser: 0.88 mg/dL (ref 0.44–1.00)
GFR, Estimated: >60 mL/min (ref >60)
Glucose, Bld: 128 mg/dL — ABNORMAL HIGH (ref 70–99)
Potassium: 4.2 mmol/L (ref 3.5–5.1)
Sodium: 138 mmol/L (ref 135–145)

## 2022-07-22 ENCOUNTER — Encounter: Payer: Self-pay | Admitting: Internal Medicine

## 2022-07-22 NOTE — Progress Notes (Signed)
[facility] DEVICE PROGRAMMING  Patient Information: Name:  [name]  DOB:  [birth year]  MRN:  [id number]  Planned procedure:  colonoscopy with propofol Surgeon:  Dr. Abbey Chatters Date of procedure:  07/23/2022  Device Information:  Clinic EP Physician:  Cristopher Peru, MD   Device Type:  Pacemaker Manufacturer and Phone #:  Biotronik: [phone] Pacemaker Dependent?:  Yes.   Date of Last Device Check:  07/01/2022 Normal Device Function?:  Yes.    Electrophysiologist's Recommendations:  Have magnet available. Provide continuous ECG monitoring when magnet is used or reprogramming is to be performed.  Procedure should not interfere with device function.  No device programming or magnet placement needed.  Per Device Clinic Standing Orders, Damian Leavell, RN  1:26 PM 07/22/2022

## 2022-07-23 ENCOUNTER — Ambulatory Visit (HOSPITAL_COMMUNITY)
Admission: RE | Admit: 2022-07-23 | Discharge: 2022-07-23 | Disposition: A | Discharge status: Home / Self Care | Payer: PPO | Source: Ambulatory Visit | Attending: Internal Medicine | Admitting: Internal Medicine

## 2022-07-23 ENCOUNTER — Encounter (HOSPITAL_COMMUNITY): Payer: Self-pay

## 2022-07-23 ENCOUNTER — Encounter (HOSPITAL_COMMUNITY)
Admission: RE | Disposition: A | Discharge status: Home / Self Care | Payer: Self-pay | Source: Ambulatory Visit | Attending: Internal Medicine

## 2022-07-23 ENCOUNTER — Ambulatory Visit (HOSPITAL_COMMUNITY): Payer: PPO | Admitting: Certified Registered Nurse Anesthetist

## 2022-07-23 ENCOUNTER — Ambulatory Visit (HOSPITAL_BASED_OUTPATIENT_CLINIC_OR_DEPARTMENT_OTHER): Payer: PPO | Admitting: Certified Registered Nurse Anesthetist

## 2022-07-23 DIAGNOSIS — D123 Benign neoplasm of transverse colon: Secondary | ICD-10-CM | POA: Insufficient documentation

## 2022-07-23 DIAGNOSIS — F419 Anxiety disorder, unspecified: Secondary | ICD-10-CM | POA: Diagnosis not present

## 2022-07-23 DIAGNOSIS — Z86010 Personal history of colonic polyps: Secondary | ICD-10-CM | POA: Diagnosis not present

## 2022-07-23 DIAGNOSIS — D120 Benign neoplasm of cecum: Secondary | ICD-10-CM

## 2022-07-23 DIAGNOSIS — M1990 Unspecified osteoarthritis, unspecified site: Secondary | ICD-10-CM | POA: Diagnosis not present

## 2022-07-23 DIAGNOSIS — I110 Hypertensive heart disease with heart failure: Secondary | ICD-10-CM | POA: Insufficient documentation

## 2022-07-23 DIAGNOSIS — K648 Other hemorrhoids: Secondary | ICD-10-CM | POA: Insufficient documentation

## 2022-07-23 DIAGNOSIS — I509 Heart failure, unspecified: Secondary | ICD-10-CM | POA: Diagnosis not present

## 2022-07-23 DIAGNOSIS — K635 Polyp of colon: Secondary | ICD-10-CM | POA: Diagnosis not present

## 2022-07-23 DIAGNOSIS — D124 Benign neoplasm of descending colon: Secondary | ICD-10-CM

## 2022-07-23 DIAGNOSIS — F319 Bipolar disorder, unspecified: Secondary | ICD-10-CM | POA: Diagnosis not present

## 2022-07-23 DIAGNOSIS — Z1211 Encounter for screening for malignant neoplasm of colon: Secondary | ICD-10-CM | POA: Diagnosis not present

## 2022-07-23 DIAGNOSIS — Z950 Presence of cardiac pacemaker: Secondary | ICD-10-CM | POA: Diagnosis not present

## 2022-07-23 DIAGNOSIS — K5730 Diverticulosis of large intestine without perforation or abscess without bleeding: Secondary | ICD-10-CM | POA: Diagnosis not present

## 2022-07-23 DIAGNOSIS — Z09 Encounter for follow-up examination after completed treatment for conditions other than malignant neoplasm: Secondary | ICD-10-CM | POA: Diagnosis not present

## 2022-07-23 DIAGNOSIS — G4730 Sleep apnea, unspecified: Secondary | ICD-10-CM | POA: Diagnosis not present

## 2022-07-23 HISTORY — PX: POLYPECTOMY: SHX5525

## 2022-07-23 HISTORY — PX: COLONOSCOPY WITH PROPOFOL: SHX5780

## 2022-07-23 SURGERY — COLONOSCOPY WITH PROPOFOL
Anesthesia: General

## 2022-07-23 MED ORDER — LIDOCAINE HCL (PF) 2 % IJ SOLN
INTRAMUSCULAR | Status: AC
  Filled 2022-07-23: qty 5

## 2022-07-23 MED ORDER — LIDOCAINE HCL (CARDIAC) PF 100 MG/5ML IV SOSY
PREFILLED_SYRINGE | INTRAVENOUS | Status: DC | PRN
  Administered 2022-07-23: 60 mg via INTRAVENOUS

## 2022-07-23 MED ORDER — STERILE WATER FOR IRRIGATION IR SOLN
Status: DC | PRN
  Administered 2022-07-23: 60 mL

## 2022-07-23 MED ORDER — PHENYLEPHRINE HCL (PRESSORS) 10 MG/ML IV SOLN
INTRAVENOUS | Status: DC | PRN
  Administered 2022-07-23 (×3): 80 ug via INTRAVENOUS

## 2022-07-23 MED ORDER — LACTATED RINGERS IV SOLN: INTRAVENOUS | Status: DC | PRN

## 2022-07-23 MED ORDER — PROPOFOL 10 MG/ML IV BOLUS
INTRAVENOUS | Status: DC | PRN
  Administered 2022-07-23: 50 mg via INTRAVENOUS
  Administered 2022-07-23: 20 mg via INTRAVENOUS
  Administered 2022-07-23: 100 mg via INTRAVENOUS
  Administered 2022-07-23 (×2): 10 mg via INTRAVENOUS
  Administered 2022-07-23 (×2): 50 mg via INTRAVENOUS
  Administered 2022-07-23: 10 mg via INTRAVENOUS

## 2022-07-23 MED ORDER — PROPOFOL 500 MG/50ML IV EMUL
INTRAVENOUS | Status: AC
  Filled 2022-07-23: qty 50

## 2022-07-23 NOTE — Transfer of Care (Signed)
Immediate Anesthesia Transfer of Care Note  Patient: [name]  Procedure(s) Performed: COLONOSCOPY WITH PROPOFOL POLYPECTOMY  Patient Location: PACU  Anesthesia Type:General  Level of Consciousness: awake, alert , oriented, and patient cooperative  Airway & Oxygen Therapy: Patient Spontanous Breathing and Patient connected to nasal cannula oxygen  Post-op Assessment: Report given to RN, Post -op Vital signs reviewed and stable, and Patient moving all extremities X 4  Post vital signs: Reviewed and stable  Last Vitals:  Vitals Value Taken Time  BP 104/61 07/23/22 0915  Temp 36.4 C 07/23/22 0915  Pulse 62 07/23/22 0915  Resp 17 07/23/22 0915  SpO2 98 % 07/23/22 0915    Last Pain:  Vitals:   07/23/22 0915  TempSrc: Oral  PainSc: 0-No pain      Patients Stated Pain Goal: 7 (20/23/34 3568)  Complications: No notable events documented.

## 2022-07-23 NOTE — Anesthesia Postprocedure Evaluation (Signed)
Anesthesia Post Note  Patient: [name]  Procedure(s) Performed: COLONOSCOPY WITH PROPOFOL POLYPECTOMY  Patient location during evaluation: Endoscopy Anesthesia Type: General Level of consciousness: awake and alert Pain management: pain level controlled Vital Signs Assessment: post-procedure vital signs reviewed and stable Respiratory status: spontaneous breathing, nonlabored ventilation, respiratory function stable and patient connected to nasal cannula oxygen Cardiovascular status: blood pressure returned to baseline and stable Postop Assessment: no apparent nausea or vomiting Anesthetic complications: no   There were no known notable events for this encounter.   Last Vitals:  Vitals:   07/23/22 0752 07/23/22 0915  BP: (!) 148/87 104/61  Pulse: 82 62  Resp: 20 17  Temp: 36.7 C (!) 36.4 C  SpO2: 98% 98%    Last Pain:  Vitals:   07/23/22 0915  TempSrc: Oral  PainSc: 0-No pain                 Trixie Rude

## 2022-07-23 NOTE — Anesthesia Preprocedure Evaluation (Signed)
Anesthesia Evaluation  Patient identified by MRN, date of birth, ID band Patient awake    Reviewed: Allergy & Precautions, NPO status , Patient's Chart, lab work & pertinent test results, reviewed documented beta blocker date and time   Airway   TM Distance: >3 FB Neck ROM: Full    Dental no notable dental hx.    Pulmonary sleep apnea    Pulmonary exam normal        Cardiovascular Exercise Tolerance: Good hypertension, +CHF (hx) and + DOE  Normal cardiovascular exam+ pacemaker      Neuro/Psych  PSYCHIATRIC DISORDERS Anxiety Depression Bipolar Disorder   negative neurological ROS     GI/Hepatic Neg liver ROS,GERD  ,,  Endo/Other  negative endocrine ROS    Renal/GU negative Renal ROS     Musculoskeletal  (+) Arthritis ,    Abdominal  (+) + obese  Peds  Hematology negative hematology ROS (+)   Anesthesia Other Findings   Reproductive/Obstetrics negative OB ROS                             Anesthesia Physical Anesthesia Plan  ASA: 3  Anesthesia Plan: General   Post-op Pain Management: Minimal or no pain anticipated   Induction: Intravenous  PONV Risk Score and Plan: Propofol infusion and TIVA  Airway Management Planned: Nasal Cannula and Natural Airway  Additional Equipment:   Intra-op Plan:   Post-operative Plan:   Informed Consent: I have reviewed the patients History and Physical, chart, labs and discussed the procedure including the risks, benefits and alternatives for the proposed anesthesia with the patient or authorized representative who has indicated his/her understanding and acceptance.       Plan Discussed with: CRNA  Anesthesia Plan Comments:        Anesthesia Quick Evaluation

## 2022-07-23 NOTE — Progress Notes (Signed)
IV removed by Katrina Stack. Left forearm. IV site WNL. No edema or redness noted. Clean, dressing intact.

## 2022-07-23 NOTE — Discharge Instructions (Signed)
  Colonoscopy Discharge Instructions  Read the instructions outlined below and refer to this sheet in the next few weeks. These discharge instructions provide you with general information on caring for yourself after you leave the hospital. Your doctor may also give you specific instructions. While your treatment has been planned according to the most current medical practices available, unavoidable complications occasionally occur.   ACTIVITY You may resume your regular activity, but move at a slower pace for the next 24 hours.  Take frequent rest periods for the next 24 hours.  Walking will help get rid of the air and reduce the bloated feeling in your belly (abdomen).  No driving for 24 hours (because of the medicine (anesthesia) used during the test).   Do not sign any important legal documents or operate any machinery for 24 hours (because of the anesthesia used during the test).  NUTRITION Drink plenty of fluids.  You may resume your normal diet as instructed by your doctor.  Begin with a light meal and progress to your normal diet. Heavy or fried foods are harder to digest and may make you feel sick to your stomach (nauseated).  Avoid alcoholic beverages for 24 hours or as instructed.  MEDICATIONS You may resume your normal medications unless your doctor tells you otherwise.  WHAT YOU CAN EXPECT TODAY Some feelings of bloating in the abdomen.  Passage of more gas than usual.  Spotting of blood in your stool or on the toilet paper.  IF YOU HAD POLYPS REMOVED DURING THE COLONOSCOPY: No aspirin products for 7 days or as instructed.  No alcohol for 7 days or as instructed.  Eat a soft diet for the next 24 hours.  FINDING OUT THE RESULTS OF YOUR TEST Not all test results are available during your visit. If your test results are not back during the visit, make an appointment with your caregiver to find out the results. Do not assume everything is normal if you have not heard from your  caregiver or the medical facility. It is important for you to follow up on all of your test results.  SEEK IMMEDIATE MEDICAL ATTENTION IF: You have more than a spotting of blood in your stool.  Your belly is swollen (abdominal distention).  You are nauseated or vomiting.  You have a temperature over 101.  You have abdominal pain or discomfort that is severe or gets worse throughout the day.   Your colonoscopy revealed 11 polyp(s) which I removed successfully. Await pathology results, my office will contact you. I recommend repeating colonoscopy in 1 years for surveillance purposes.   You also have diverticulosis and internal hemorrhoids. I would recommend increasing fiber in your diet or adding OTC Benefiber/Metamucil. Be sure to drink at least 4 to 6 glasses of water daily. Follow-up with GI as needed.   I hope you have a great rest of your week!  Elon Alas. Abbey Chatters, D.O. Gastroenterology and Hepatology [facility]

## 2022-07-23 NOTE — H&P (Signed)
Primary Care Physician:  Andrea Drown, MD Primary Gastroenterologist:  Dr. Abbey Chatters  Pre-Procedure History & Physical: HPI:  [name] is a 72 y.o. 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 Lovena Le   Sleep apnea     Past Surgical History:  Procedure Laterality Date   ABDOMINAL HYSTERECTOMY     APPENDECTOMY     BREAST BIOPSY Left 2011   BREAST BIOPSY  2008   BREAST LUMPECTOMY Left 2008   CHOLECYSTECTOMY     COLONOSCOPY N/A 09/13/2013   Procedure: COLONOSCOPY;  Surgeon: [name], MD;  [facility];  Service: Endoscopy;  Laterality: N/A;  730   EP IMPLANTABLE DEVICE N/A 01/06/2016   Procedure: Pacemaker Implant;  Surgeon: [name], MD;  Location: [facility];  Service: Cardiovascular;  Laterality: N/A;   ESOPHAGOGASTRODUODENOSCOPY N/A 01/17/2014   Procedure: ESOPHAGOGASTRODUODENOSCOPY (EGD);  Surgeon: [name], MD;  [facility];  Service: Endoscopy;  Laterality: N/A;  245   RIGHT/LEFT HEART CATH AND CORONARY ANGIOGRAPHY N/A 01/01/2020   Procedure: RIGHT/LEFT HEART CATH AND CORONARY ANGIOGRAPHY;  Surgeon: [name], MD;  Location: [facility];  Service: Cardiovascular;  Laterality: N/A;    TOTAL KNEE ARTHROPLASTY Bilateral right knee   2012, 2007    Prior to Admission medications   Medication Sig Start Date End Date Taking? Authorizing Provider  acetaminophen (TYLENOL) 650 MG CR tablet Take 1,300 mg by mouth 2 (two) times daily.   Yes [provider]  ALPRAZolam (XANAX XR) 1 MG 24 hr tablet Take 1 mg by mouth every morning.   Yes [provider]  aspirin EC 81 MG tablet Take 1 tablet (81 mg total) by mouth daily. Swallow whole. 02/04/22  Yes [name], MD  Cholecalciferol (VITAMIN D3) 1.25 MG (50000 UT) CAPS Take 50,000 Units by mouth once a week. Sunday 08/07/21  Yes [provider]  glucosamine-chondroitin 500-400 MG tablet Take 2 tablets by mouth every morning.    Yes [provider]  lamoTRIgine (LAMICTAL) 200 MG tablet Take 400 mg by mouth at bedtime.   Yes [provider]  Lutein 20 MG CAPS Take 20 mg by mouth every morning. 01/28/21  Yes [provider]  Multiple Vitamin (MULTIVITAMIN) tablet Take 1 tablet by mouth daily. Senior   Yes [provider]  QUEtiapine (SEROQUEL) 300 MG tablet Take 600 mg by mouth at bedtime. 05/07/13  Yes Erin Sons D, MD  rosuvastatin (CRESTOR) 10 MG tablet TAKE 1 TABLET(10 MG) BY MOUTH DAILY 02/02/22  Yes [name], MD  ALPRAZolam [name]) 0.5 MG tablet Take  0.5 mg by mouth daily as needed for anxiety.    [provider]  erythromycin ophthalmic ointment Place 1 Application into the left eye in the morning and at bedtime. Patient not taking: Reported on 07/16/2022 05/24/22   Mar Daring, PA-C    Allergies as of 07/02/2022 - Review Complete 05/26/2022  Allergen Reaction Noted   Imipramine Hives and Rash 07/22/2011   Tricor [fenofibrate] Other (See Comments) 07/30/2013    Family History  Problem Relation Age of Onset   Breast cancer Mother    Bipolar disorder Mother    Diabetes Mother    Alcohol abuse Father    Hypertension Father     Bipolar disorder Sister    Diabetes Sister    Heart disease Other     Social History   Socioeconomic History   Marital status: Married    Spouse name: Not on file   Number of children: 3   Years of education: college   Highest education level: Associate degree: occupational, Hotel manager, or vocational program  Occupational History   Occupation: Therapist, sports  Tobacco Use   Smoking status: Never    Passive exposure: Never   Smokeless tobacco: Never  Vaping Use   Vaping Use: Never used  Substance and Sexual Activity   Alcohol use: No    Alcohol/week: 0.0 standard drinks of alcohol   Drug use: No   Sexual activity: Not Currently  Other Topics Concern   Not on file  Social History Narrative   04/24/2013 [name] was born and grew up in [place]. She reports that her childhood was "tough." She has 2 older sisters and a younger brother. She achieved an Associates Degree in nursing. 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 She has 2 daughters and one son. She denies any legal difficulties. She affiliates as Engineer, manufacturing. Her hobbies include reading, sewing, crafts, and cooking. She reports that her social support system consists of her friend and passed her. 04/24/2013 [name]      Lives alone.   Right-handed.   No caffeine use.   Social Determinants of Health   Financial Resource Strain: Low Risk  (09/01/2021)   Overall Financial Resource Strain (CARDIA)    Difficulty of Paying Living Expenses: Not hard at all  Food Insecurity: No Food Insecurity (09/01/2021)   Hunger Vital Sign    Worried About Running Out of Food in the Last Year: Never true    Ran Out of Food in the Last Year: Never true  Transportation Needs: No Transportation Needs (09/01/2021)   PRAPARE - Hydrologist (Medical): No    Lack of Transportation (Non-Medical): No  Physical Activity: Sufficiently Active (09/01/2021)   Exercise Vital Sign     Days of Exercise per Week: 4 days    Minutes of Exercise per Session: 50 min  Stress: No Stress Concern Present (09/01/2021)   [place]    Feeling of Stress : Only a little  Social Connections: Socially Integrated (09/01/2021)   Social Connection and Isolation Panel [NHANES]    Frequency of Communication with Friends and Family: More than three times a week    Frequency of Social Gatherings with Friends and Family: More than three times a week    Attends Religious Services: More than 4 times per year    Active Member of Clubs or Organizations: Yes  Attends Archivist Meetings: More than 4 times per year    Marital Status: Married  Human resources officer Violence: Not At Risk (09/01/2021)   Humiliation, Afraid, Rape, and Kick questionnaire    Fear of Current or Ex-Partner: No    Emotionally Abused: No    Physically Abused: No    Sexually Abused: No    Review of Systems: See HPI, otherwise negative ROS  Physical Exam: Vital signs in last 24 hours: Temp:  [98.1 F (36.7 C)] 98.1 F (36.7 C) (01/12 0752) Pulse Rate:  [82] 82 (01/12 0752) Resp:  [20] 20 (01/12 0752) BP: (148)/(87) 148/87 (01/12 0752) SpO2:  [98 %] 98 % (01/12 0752) Weight:  [114.3 kg] 114.3 kg (01/12 0752)   General:   Alert,  Well-developed, well-nourished, pleasant and cooperative in NAD Head:  Normocephalic and atraumatic. Eyes:  Sclera clear, no icterus.   Conjunctiva pink. Ears:  Normal auditory acuity. Nose:  No deformity, discharge,  or lesions. Msk:  Symmetrical without gross deformities. Normal posture. Extremities:  Without clubbing or edema. Neurologic:  Alert and  oriented x4;  grossly normal neurologically. Skin:  Intact without significant lesions or rashes. Psych:  Alert and cooperative. Normal mood and affect.  Impression/Plan: [name] is here for a colonoscopy to be performed for surveillance purposes, personal  history of adenomatous colon polyps in 2015  The risks of the procedure including infection, bleed, or perforation as well as benefits, limitations, alternatives and imponderables have been reviewed with the patient. Questions have been answered. All parties agreeable.

## 2022-07-23 NOTE — Op Note (Signed)
[facility] Patient Name: [name] Procedure Date: 07/23/2022 8:34 AM MRN: [id number] Date of Birth: 09/05/1950 Attending MD: Elon Alas. Abbey Chatters , [place], [id number] CSN: [id number] Age: 72 Admit Type: Outpatient Procedure:                Colonoscopy Indications:              Surveillance: Personal history of adenomatous                            polyps on last colonoscopy > 5 years ago Providers:                Elon Alas. Abbey Chatters, DO, Caprice Kluver, Raphael Gibney,                            Technician Referring MD:              Medicines:                See the Anesthesia note for documentation of the                            administered medications Complications:            No immediate complications. Estimated Blood Loss:     Estimated blood loss was minimal. Procedure:                Pre-Anesthesia Assessment:                           - The anesthesia plan was to use monitored                            anesthesia care (MAC).                           After obtaining informed consent, the colonoscope                            was passed under direct vision. Throughout the                            procedure, the patient's blood pressure, pulse, and                            oxygen saturations were monitored continuously. The                            [id number]-HQ190L ([id number]) scope was introduced through                            the anus and advanced to the the cecum, identified                            by appendiceal orifice and ileocecal valve. The                            colonoscopy was performed without  difficulty. The                            patient tolerated the procedure well. The quality                            of the bowel preparation was evaluated using the                            BBPS [facility] Bowel Preparation Scale) with scores                            of: Right Colon = 2 (minor amount of residual                            staining, small  fragments of stool and/or opaque                            liquid, but mucosa seen well), Transverse Colon = 2                            (minor amount of residual staining, small fragments                            of stool and/or opaque liquid, but mucosa seen                            well) and Left Colon = 3 (entire mucosa seen well                            with no residual staining, small fragments of stool                            or opaque liquid). The total BBPS score equals 7.                            The quality of the bowel preparation was good. Scope In: 8:40:30 AM Scope Out: 9:10:45 AM Scope Withdrawal Time: 0 hours 24 minutes 31 seconds  Total Procedure Duration: 0 hours 30 minutes 15 seconds  Findings:      The perianal and digital rectal examinations were normal.      Non-bleeding internal hemorrhoids were found during endoscopy.      Multiple large-mouthed and small-mouthed diverticula were found in the       sigmoid colon and descending colon.      Two sessile polyps were found in the cecum. The polyps were 4 to 5 mm in       size. These polyps were removed with a cold snare. Resection and       retrieval were complete.      Nine sessile polyps were found in the descending colon and transverse       colon. The polyps were 4 to 10 mm in size. These polyps were removed       with a cold snare. Resection and retrieval were  complete. 1 polyp not       retrieved Impression:               - Non-bleeding internal hemorrhoids.                           - Diverticulosis in the sigmoid colon and in the                            descending colon.                           - Two 4 to 5 mm polyps in the cecum, removed with a                            cold snare. Resected and retrieved.                           - Nine 4 to 10 mm polyps in the descending colon                            and in the transverse colon, removed with a cold                            snare.  Resected and retrieved. Moderate Sedation:      Per Anesthesia Care Recommendation:           - Patient has a contact number available for                            emergencies. The signs and symptoms of potential                            delayed complications were discussed with the                            patient. Return to normal activities tomorrow.                            Written discharge instructions were provided to the                            patient.                           - Resume previous diet.                           - Continue present medications.                           - Await pathology results.                           - Repeat colonoscopy in 1 year for surveillance.                           -  Return to GI clinic PRN. Procedure Code(s):        --- Professional ---                           [phone], Colonoscopy, flexible; with removal of                            tumor(s), polyp(s), or other lesion(s) by snare                            technique Diagnosis Code(s):        --- Professional ---                           Z86.010, Personal history of colonic polyps                           K64.8, Other hemorrhoids                           D12.0, Benign neoplasm of cecum                           D12.4, Benign neoplasm of descending colon                           D12.3, Benign neoplasm of transverse colon (hepatic                            flexure or splenic flexure)                           K57.30, Diverticulosis of large intestine without                            perforation or abscess without bleeding CPT copyright 2022 American Medical Association. All rights reserved. The codes documented in this report are preliminary and upon coder review may  be revised to meet current compliance requirements. Elon Alas. Abbey Chatters, DO [place] Abbey Chatters, DO 07/23/2022 9:14:58 AM This report has been signed electronically. Number of Addenda: 0

## 2022-07-23 NOTE — Progress Notes (Signed)
Remote pacemaker transmission.   

## 2022-07-26 LAB — SURGICAL PATHOLOGY

## 2022-07-29 ENCOUNTER — Encounter (HOSPITAL_COMMUNITY): Payer: Self-pay | Admitting: Internal Medicine

## 2022-08-03 ENCOUNTER — Encounter: Payer: Self-pay | Admitting: Family Medicine

## 2022-08-03 ENCOUNTER — Ambulatory Visit (INDEPENDENT_AMBULATORY_CARE_PROVIDER_SITE_OTHER): Payer: PPO | Admitting: Family Medicine

## 2022-08-03 VITALS — BP 127/75 | Temp 97.4°F | Ht 64.0 in | Wt 261.0 lb

## 2022-08-03 DIAGNOSIS — L03012 Cellulitis of left finger: Secondary | ICD-10-CM | POA: Diagnosis not present

## 2022-08-03 DIAGNOSIS — L853 Xerosis cutis: Secondary | ICD-10-CM

## 2022-08-03 DIAGNOSIS — L309 Dermatitis, unspecified: Secondary | ICD-10-CM

## 2022-08-03 MED ORDER — CEPHALEXIN 500 MG PO CAPS: 500.0000 mg | ORAL_CAPSULE | Freq: Four times a day (QID) | ORAL | 0 refills | Status: DC

## 2022-08-03 MED ORDER — TRIAMCINOLONE ACETONIDE 0.1 % EX CREA: 1.0000 | TOPICAL_CREAM | Freq: Two times a day (BID) | CUTANEOUS | 1 refills | Status: DC

## 2022-08-03 NOTE — Progress Notes (Signed)
   Subjective:    Patient ID: [name], female    DOB: August 03, 1950, 72 y.o.   MRN: [id number]  HPI Burn of left small finger and skin lesions on multiple sites of her body x 1 month Patient overall doing fairly well Denies any major setbacks Has some patches of dry skin which itch intensely   Review of Systems     Objective:   Physical Exam  General-in no acute distress Eyes-no discharge Lungs-respiratory rate normal, CTA CV-no murmurs,RRR Extremities skin warm dry no edema Neuro grossly normal Behavior normal, alert Dry skin noted on the left side of the hand along with a small amount of cellulitis on the distal pinky      Assessment & Plan:  1. Cellulitis of finger of left hand Antibiotic for 5 to 7 days follow-up if ongoing trouble  2. Eczema, unspecified type Lotion on a regular basis steroid creams  3. Dry skin dermatitis Humidifier moisturizer creams  Follow-up with ongoing troubles keep regular follow-up visits

## 2022-08-05 DIAGNOSIS — F4323 Adjustment disorder with mixed anxiety and depressed mood: Secondary | ICD-10-CM | POA: Diagnosis not present

## 2022-08-09 DIAGNOSIS — H353211 Exudative age-related macular degeneration, right eye, with active choroidal neovascularization: Secondary | ICD-10-CM | POA: Diagnosis not present

## 2022-08-09 DIAGNOSIS — H04123 Dry eye syndrome of bilateral lacrimal glands: Secondary | ICD-10-CM | POA: Diagnosis not present

## 2022-08-20 DIAGNOSIS — H26493 Other secondary cataract, bilateral: Secondary | ICD-10-CM | POA: Diagnosis not present

## 2022-08-20 DIAGNOSIS — H2000 Unspecified acute and subacute iridocyclitis: Secondary | ICD-10-CM | POA: Diagnosis not present

## 2022-08-20 DIAGNOSIS — H34232 Retinal artery branch occlusion, left eye: Secondary | ICD-10-CM | POA: Diagnosis not present

## 2022-08-20 DIAGNOSIS — Z961 Presence of intraocular lens: Secondary | ICD-10-CM | POA: Diagnosis not present

## 2022-08-20 DIAGNOSIS — H5711 Ocular pain, right eye: Secondary | ICD-10-CM | POA: Diagnosis not present

## 2022-08-20 DIAGNOSIS — H353131 Nonexudative age-related macular degeneration, bilateral, early dry stage: Secondary | ICD-10-CM | POA: Diagnosis not present

## 2022-08-27 ENCOUNTER — Encounter: Payer: Self-pay | Admitting: Family Medicine

## 2022-08-27 DIAGNOSIS — Z79899 Other long term (current) drug therapy: Secondary | ICD-10-CM

## 2022-08-27 DIAGNOSIS — H353131 Nonexudative age-related macular degeneration, bilateral, early dry stage: Secondary | ICD-10-CM | POA: Diagnosis not present

## 2022-08-27 DIAGNOSIS — H26493 Other secondary cataract, bilateral: Secondary | ICD-10-CM | POA: Diagnosis not present

## 2022-08-27 DIAGNOSIS — H34232 Retinal artery branch occlusion, left eye: Secondary | ICD-10-CM | POA: Diagnosis not present

## 2022-08-27 DIAGNOSIS — H2000 Unspecified acute and subacute iridocyclitis: Secondary | ICD-10-CM | POA: Diagnosis not present

## 2022-08-27 DIAGNOSIS — R7303 Prediabetes: Secondary | ICD-10-CM

## 2022-08-27 DIAGNOSIS — E785 Hyperlipidemia, unspecified: Secondary | ICD-10-CM

## 2022-08-27 DIAGNOSIS — H5711 Ocular pain, right eye: Secondary | ICD-10-CM | POA: Diagnosis not present

## 2022-08-27 DIAGNOSIS — Z961 Presence of intraocular lens: Secondary | ICD-10-CM | POA: Diagnosis not present

## 2022-08-27 NOTE — Telephone Encounter (Signed)
I would recommend lipid, liver, metabolic 7, [id number], urine ACR Diagnosis hyperlipidemia, prediabetes, high risk medications  Please make sure that the patient is aware that we will be doing a urine to check for protein thank you

## 2022-08-27 NOTE — Addendum Note (Signed)
Addended by: Orvan Seen on: 08/27/2022 04:12 PM   Modules accepted: Orders

## 2022-08-30 DIAGNOSIS — H35351 Cystoid macular degeneration, right eye: Secondary | ICD-10-CM | POA: Diagnosis not present

## 2022-08-30 DIAGNOSIS — H34232 Retinal artery branch occlusion, left eye: Secondary | ICD-10-CM | POA: Diagnosis not present

## 2022-08-30 DIAGNOSIS — H3321 Serous retinal detachment, right eye: Secondary | ICD-10-CM | POA: Diagnosis not present

## 2022-08-30 DIAGNOSIS — H35071 Retinal telangiectasis, right eye: Secondary | ICD-10-CM | POA: Diagnosis not present

## 2022-08-30 DIAGNOSIS — H353211 Exudative age-related macular degeneration, right eye, with active choroidal neovascularization: Secondary | ICD-10-CM | POA: Diagnosis not present

## 2022-08-30 DIAGNOSIS — H353132 Nonexudative age-related macular degeneration, bilateral, intermediate dry stage: Secondary | ICD-10-CM | POA: Diagnosis not present

## 2022-08-31 DIAGNOSIS — R7303 Prediabetes: Secondary | ICD-10-CM | POA: Diagnosis not present

## 2022-08-31 DIAGNOSIS — E785 Hyperlipidemia, unspecified: Secondary | ICD-10-CM | POA: Diagnosis not present

## 2022-08-31 DIAGNOSIS — Z79899 Other long term (current) drug therapy: Secondary | ICD-10-CM | POA: Diagnosis not present

## 2022-09-01 ENCOUNTER — Ambulatory Visit (INDEPENDENT_AMBULATORY_CARE_PROVIDER_SITE_OTHER): Payer: PPO | Admitting: Family Medicine

## 2022-09-01 VITALS — BP 119/72 | Wt 260.2 lb

## 2022-09-01 DIAGNOSIS — R7303 Prediabetes: Secondary | ICD-10-CM

## 2022-09-01 DIAGNOSIS — H3530 Unspecified macular degeneration: Secondary | ICD-10-CM

## 2022-09-01 DIAGNOSIS — R42 Dizziness and giddiness: Secondary | ICD-10-CM | POA: Diagnosis not present

## 2022-09-01 DIAGNOSIS — E785 Hyperlipidemia, unspecified: Secondary | ICD-10-CM | POA: Diagnosis not present

## 2022-09-01 DIAGNOSIS — I6523 Occlusion and stenosis of bilateral carotid arteries: Secondary | ICD-10-CM

## 2022-09-01 LAB — HEPATIC FUNCTION PANEL
ALT: 27 IU/L (ref 0–32)
AST: 24 IU/L (ref 0–40)
Albumin: 4.8 g/dL (ref 3.9–4.9)
Alkaline Phosphatase: 126 IU/L — ABNORMAL HIGH (ref 44–121)
Bilirubin Total: 0.5 mg/dL (ref 0.0–1.2)
Bilirubin, Direct: 0.15 mg/dL (ref 0.00–0.40)
Total Protein: 7.4 g/dL (ref 6.0–8.5)

## 2022-09-01 LAB — BASIC METABOLIC PANEL (7)
BUN/Creatinine Ratio: 20 (ref 12–28)
BUN: 18 mg/dL (ref 8–27)
CO2: 25 mmol/L (ref 20–29)
Chloride: 100 mmol/L (ref 96–106)
Creatinine, Ser: 0.9 mg/dL (ref 0.57–1.00)
Glucose: 118 mg/dL — ABNORMAL HIGH (ref 70–99)
Potassium: 4.7 mmol/L (ref 3.5–5.2)
Sodium: 140 mmol/L (ref 134–144)
eGFR: 69 mL/min/{1.73_m2} (ref >59)

## 2022-09-01 LAB — HEMOGLOBIN A1C
Est. average glucose Bld gHb Est-mCnc: 128 mg/dL
Hgb A1c MFr Bld: 6.1 % — ABNORMAL HIGH (ref 4.8–5.6)

## 2022-09-01 LAB — MICROALBUMIN / CREATININE URINE RATIO
Creatinine, Urine: 44.8 mg/dL
Microalb/Creat Ratio: <7 mg/g creat (ref 0–29)
Microalbumin, Urine: <3 ug/mL

## 2022-09-01 LAB — LIPID PANEL
Chol/HDL Ratio: 2.8 ratio (ref 0.0–4.4)
Cholesterol, Total: 132 mg/dL (ref 100–199)
HDL: 48 mg/dL (ref >39)
LDL Chol Calc (NIH): 53 mg/dL (ref 0–99)
Triglycerides: 191 mg/dL — ABNORMAL HIGH (ref 0–149)
VLDL Cholesterol Cal: 31 mg/dL (ref 5–40)

## 2022-09-01 NOTE — Progress Notes (Signed)
   Subjective:    Patient ID: [name], female    DOB: 1951-04-20, 72 y.o.   MRN: [id number]  HPI Patient arrives today for 6 month follow up. Patient states she has been having a odd sinking feeling when she gets up or bends over x months. Patient states she has fallen 3 times. Patient states she has recently been dx with macular degeneration right eye and is currently receiving treatment.   Patient relates that time she has sinking feeling when she stands or when she bends over.  She denies blacking out denies feeling like she is going to pass out.  Review of Systems     Objective:   Physical Exam  General-in no acute distress Eyes-no discharge Lungs-respiratory rate normal, CTA CV-no murmurs,RRR Extremities skin warm dry no edema Neuro grossly normal Behavior normal, alert We reviewed over the results of her test Urine micro protein negative A1c slightly elevated LDL very good control with LDL of 53 Liver enzymes look good alkaline phosphatase minimally elevated will monitor Kidney function good control electrolytes good     Assessment & Plan:  1. Hyperlipidemia, unspecified hyperlipidemia type Continue statin.  Continue to keep cholesterol under good control. - US Carotid Duplex Bilateral LDL 53 HDL 48 Continue rosuvastatin She has an appointment coming up with cardiology We will share the results of the carotid ultrasound with cardiology She has a history of carotid artery atherosclerosis 2. Episodic lightheadedness I believe that the episodic lightheadedness is probably a vagal related issue.  Blood pressure looks good today.  I have encouraged her to be careful with quick standing and quick sitting up  3. Macular degeneration, unspecified laterality, unspecified type She states she will have to get injections every 6 weeks from her ophthalmologist to help treat this problem this is a discouraging issue for the patient but she is trying to work her way through it  as best as possible  4. Prediabetes Prediabetes minimal elevation of A1c continue healthy diet

## 2022-09-02 ENCOUNTER — Encounter: Payer: Self-pay | Admitting: Cardiology

## 2022-09-02 ENCOUNTER — Ambulatory Visit: Payer: PPO | Attending: Cardiology | Admitting: Cardiology

## 2022-09-02 VITALS — BP 144/92 | HR 72 | Ht 64.0 in | Wt 262.0 lb

## 2022-09-02 DIAGNOSIS — I6523 Occlusion and stenosis of bilateral carotid arteries: Secondary | ICD-10-CM

## 2022-09-02 DIAGNOSIS — I495 Sick sinus syndrome: Secondary | ICD-10-CM

## 2022-09-02 NOTE — Patient Instructions (Signed)
Medication Instructions:  Your physician recommends that you continue on your current medications as directed. Please refer to the Current Medication list given to you today.   Labwork: None today  Testing/Procedures: None today  Follow-Up: 1 year Dr.McDowell  Any Other Special Instructions Will Be Listed Below (If Applicable).  If you need a refill on your cardiac medications before your next appointment, please call your pharmacy.     Thank you for choosing [facility] !

## 2022-09-02 NOTE — Progress Notes (Signed)
Cardiology Office Note  Date: 09/02/2022   ID: [name], DOB September 30, 1950, MRN [id number]  PCP:  Kathyrn Drown, MD  Cardiologist:  Rozann Lesches, MD Electrophysiologist:  Cristopher Peru, MD   Chief Complaint  Patient presents with   Cardiac follow-up    History of Present Illness: [name] is a 72 y.o. female last seen in February 2023.  She is here for a routine visit.  Just had follow-up with Dr. Wolfgang Phoenix yesterday, I reviewed the note.  From a cardiac perspective, she remains clinically stable, no exertional chest pain or worsening dyspnea on exertion.  She exercises at the [employer] 4 to 5 days a week.  No palpitations or syncope, although she does have some degree of orthostatic dizziness at times.  Her blood pressure was mildly elevated today, although has been in normal range by the last 2 healthcare checks.  She is not on antihypertensive therapy.  Biotronik pacemaker in place with follow-up by Dr. Lovena Le.  Last device check in December 2023 indicated normal function.  ECG today shows an atrial paced rhythm.  Follow-up carotid Dopplers have been ordered by Dr. Wolfgang Phoenix.  She remains on aspirin and Crestor, recent lipid panel showed LDL 53.   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 Lovena Le   Sleep apnea     Current Outpatient Medications   Medication Sig Dispense Refill   acetaminophen (TYLENOL) 650 MG CR tablet Take 1,300 mg by mouth 2 (two) times daily.     ALPRAZolam (XANAX XR) 1 MG 24 hr tablet Take 1 mg by mouth every morning.     ALPRAZolam (XANAX) 0.5 MG tablet Take 0.5 mg by mouth daily as needed for anxiety.     aspirin EC 81 MG tablet Take 1 tablet (81 mg total) by mouth daily. Swallow whole. 90 tablet 3   Cholecalciferol (VITAMIN D3) 1.25 MG (50000 UT) CAPS Take 50,000 Units by mouth once a week. Sunday     glucosamine-chondroitin 500-400 MG tablet Take 2 tablets by mouth every morning.      lamoTRIgine (LAMICTAL) 200 MG tablet Take 400 mg by mouth at bedtime.     Lutein 20 MG CAPS Take 20 mg by mouth every morning.     Multiple Vitamin (MULTIVITAMIN) tablet Take 1 tablet by mouth daily. Senior     prednisoLONE acetate (PRED FORTE) 1 % ophthalmic suspension Place 1 drop into the right eye 4 (four) times daily.     QUEtiapine (SEROQUEL) 300 MG tablet Take 600 mg by mouth at bedtime.     rosuvastatin (CRESTOR) 10 MG tablet TAKE 1 TABLET(10 MG) BY MOUTH DAILY 90 tablet 3   triamcinolone cream (KENALOG) 0.1 % Apply 1 Application topically 2 (two) times  daily. 45 g 1   No current facility-administered medications for this visit.   Allergies:  Imipramine and Tricor [fenofibrate]   ROS: No orthopnea or PND.  Physical Exam: VS:  BP (!) 144/92   Pulse 72   Ht 5' 4"$  (1.626 m)   Wt 262 lb (118.8 kg)   SpO2 99%   BMI 44.97 kg/m , BMI Body mass index is 44.97 kg/m.  Wt Readings from Last 3 Encounters:  09/02/22 262 lb (118.8 kg)  09/01/22 260 lb 3.2 oz (118 kg)  08/03/22 261 lb (118.4 kg)    General: Patient appears comfortable at rest. HEENT: Conjunctiva and lids normal. Neck: Supple, no elevated JVP or carotid bruits. Lungs: Clear to auscultation, nonlabored breathing at rest. Cardiac: Regular rate and rhythm, no S3 or significant systolic murmur.  ECG:  An ECG dated 01/04/2022 was personally reviewed today  and demonstrated:  Atrial paced rhythm with LVH.  Recent Labwork: 08/31/2022: ALT 27; AST 24; BUN 18; Creatinine, Ser 0.90; Potassium 4.7; Sodium 140     Component Value Date/Time   CHOL 132 08/31/2022 0812   TRIG 191 (H) 08/31/2022 0812   HDL 48 08/31/2022 0812   CHOLHDL 2.8 08/31/2022 0812   CHOLHDL 5.0 02/06/2020 0859   VLDL 46 (H) 02/06/2020 0859   LDLCALC 53 08/31/2022 0812    Other Studies Reviewed Today:  Carotid Dopplers 12/11/2020: IMPRESSION: 1. Bilateral carotid bifurcation plaque resulting in less than 50% diameter ICA stenosis. 2. Antegrade bilateral vertebral arterial flow.  Assessment and Plan:  1.  Sinus node dysfunction status post Biotronik pacemaker with continued follow-up by Dr. Lovena Le.  Last device interrogation showed normal function.  ECG shows an atrial paced rhythm today.  She remains asymptomatic.  2.  Nonobstructive bilateral carotid artery disease by last assessment in 2022.  She remains asymptomatic and has follow-up carotid Dopplers ordered by Dr. Wolfgang Phoenix.  She remains on low-dose aspirin and also Crestor with LDL 53 by recent check.  3.  Elevated blood pressure by today's check, although in normal range by her last 2 office encounters.  Currently not on antihypertensive therapy.  No changes were made today.  Medication Adjustments/Labs and Tests Ordered: Current medicines are reviewed at length with the patient today.  Concerns regarding medicines are outlined above.   Tests Ordered: Orders Placed This Encounter  Procedures   EKG 12-Lead    Medication Changes: No orders of the defined types were placed in this encounter.   Disposition:  Follow up  1 year.  Signed, [name], MD, [facility] 09/02/2022 10:28 AM    [place] [facility] [address], [place], [place] [address] Phone: [phone]; Fax: [phone] By Dr. Wolfgang Phoenix.

## 2022-09-06 DIAGNOSIS — F4323 Adjustment disorder with mixed anxiety and depressed mood: Secondary | ICD-10-CM | POA: Diagnosis not present

## 2022-09-09 NOTE — Progress Notes (Signed)
Subjective:   [name] is a 72 y.o. female who presents for Medicare Annual (Subsequent) preventive examination.  Review of Systems     Cardiac Risk Factors include: advanced age (>55mn, >>77women);hypertension;sedentary lifestyle     Objective:    Today's Vitals   09/10/22 1002  BP: 128/72  Weight: 264 lb 6.4 oz (119.9 kg)  Height: '5\' 4"'$  (1.626 m)   Body mass index is 45.38 kg/m.     09/10/2022   11:40 AM 07/21/2022   11:48 AM 09/01/2021   10:05 AM 03/18/2021    2:34 PM 01/26/2021   10:12 AM 12/31/2019    6:18 PM 12/31/2019    1:22 PM  Advanced Directives  Does Patient Have a Medical Advance Directive? Yes Yes Yes Yes Yes Yes Yes  Type of Advance Directive Living will;Healthcare Power of [place]Living will [place] will [place] Park[place] will Living will [place]Living will   Does patient want to make changes to medical advance directive? No - Patient declined No - Patient declined  No - Patient declined No - Patient declined No - Patient declined   Copy of [place] Chart? No - copy requested No - copy requested No - copy requested No - copy requested  No - copy requested   Would patient like information on creating a medical advance directive?  No - Patient declined   Yes (ED - Information included in AVS);No - Patient declined  Yes (ED - Information included in AVS)    Current Medications (verified) Outpatient Encounter Medications as of 09/10/2022  Medication Sig   acetaminophen (TYLENOL) 650 MG CR tablet Take 1,300 mg by mouth 2 (two) times daily.   ALPRAZolam (XANAX XR) 1 MG 24 hr tablet Take 1 mg by mouth every morning.   ALPRAZolam (XANAX) 0.5 MG tablet Take 0.5 mg by mouth daily as needed for anxiety.   aspirin EC 81 MG tablet Take 1 tablet (81 mg total) by mouth daily. Swallow whole.   Cholecalciferol (VITAMIN D3) 1.25 MG (50000 UT) CAPS Take  50,000 Units by mouth once a week. Sunday   glucosamine-chondroitin 500-400 MG tablet Take 2 tablets by mouth every morning.    lamoTRIgine (LAMICTAL) 200 MG tablet Take 400 mg by mouth at bedtime.   Lutein 20 MG CAPS Take 20 mg by mouth every morning.   Multiple Vitamin (MULTIVITAMIN) tablet Take 1 tablet by mouth daily. Senior   prednisoLONE acetate (PRED FORTE) 1 % ophthalmic suspension Place 1 drop into the right eye 4 (four) times daily.   QUEtiapine (SEROQUEL) 300 MG tablet Take 600 mg by mouth at bedtime.   rosuvastatin (CRESTOR) 10 MG tablet TAKE 1 TABLET(10 MG) BY MOUTH DAILY   triamcinolone cream (KENALOG) 0.1 % Apply 1 Application topically 2 (two) times daily.   No facility-administered encounter medications on file as of 09/10/2022.    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 Lovena Le   Sleep apnea    Past Surgical History:  Procedure Laterality Date   ABDOMINAL HYSTERECTOMY     APPENDECTOMY     BREAST BIOPSY Left 2011   BREAST BIOPSY  2008   BREAST LUMPECTOMY Left 2008   CHOLECYSTECTOMY     COLONOSCOPY N/A 09/13/2013   Procedure: COLONOSCOPY;  Surgeon: [name], MD;  [facility];  Service: Endoscopy;  Laterality: N/A;  730   COLONOSCOPY WITH PROPOFOL N/A  07/23/2022   Procedure: COLONOSCOPY WITH PROPOFOL;  Surgeon: Eloise Harman, DO;  [facility];  Service: Endoscopy;  Laterality: N/A;  11:30 am   EP IMPLANTABLE DEVICE N/A 01/06/2016   Procedure: Pacemaker Implant;  Surgeon: [name], MD;  Location: [place] [facility];  Service: Cardiovascular;  Laterality: N/A;   ESOPHAGOGASTRODUODENOSCOPY N/A 01/17/2014   Procedure: ESOPHAGOGASTRODUODENOSCOPY (EGD);  Surgeon: [name], MD;  [facility];  Service: Endoscopy;  Laterality: N/A;  245   POLYPECTOMY  07/23/2022   Procedure: POLYPECTOMY;  Surgeon: Eloise Harman, DO;  [facility];  Service: Endoscopy;;   RIGHT/LEFT HEART CATH AND CORONARY ANGIOGRAPHY N/A 01/01/2020   Procedure: RIGHT/LEFT HEART CATH AND CORONARY ANGIOGRAPHY;  Surgeon: [name], MD;  Location: [place] [facility] LAB;  Service: Cardiovascular;  Laterality: N/A;   TOTAL KNEE ARTHROPLASTY Bilateral right knee   2012, 2007   Family History  Problem Relation Age of Onset   Breast cancer Mother    Bipolar disorder Mother    Diabetes Mother    Alcohol abuse Father    Hypertension Father    Bipolar disorder Sister    Diabetes Sister    Heart disease Other    Social History   Socioeconomic History   Marital status: Married    Spouse name: Not on file   Number of children: 3   Years of education: college   Highest education level: Associate degree: occupational, Hotel manager, or vocational program  Occupational History   Occupation: Therapist, sports  Tobacco Use   Smoking status: Never    Passive exposure: Never   Smokeless tobacco: Never  Vaping Use   Vaping Use: Never used  Substance and Sexual Activity   Alcohol use: No    Alcohol/week: 0.0 standard drinks of alcohol   Drug use: No   Sexual activity: Not Currently  Other Topics Concern   Not on file  Social History Narrative   04/24/2013 [name] was born and grew up in [place]. She reports that her childhood  was "tough." She has 2 older sisters and a younger brother. She achieved an Associates Degree in nursing. 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 She has 2 daughters and one son. She denies any legal difficulties. She affiliates as Engineer, manufacturing. Her hobbies include reading, sewing, crafts, and cooking. She reports that her social support system consists of her friend and passed her. 04/24/2013 [name]      Lives alone.   Right-handed.   No caffeine use.   Social Determinants of Health   Financial Resource Strain: Low Risk  (09/10/2022)  Overall Financial Resource Strain (CARDIA)    Difficulty of Paying Living Expenses: Not hard at all  Food Insecurity: No Food Insecurity (09/10/2022)   Hunger Vital Sign    Worried About Running Out of Food in the Last Year: Never true    Ran Out of Food in the Last Year: Never true  Transportation Needs: No Transportation Needs (09/10/2022)   PRAPARE - Hydrologist (Medical): No    Lack of Transportation (Non-Medical): No  Physical Activity: Sufficiently Active (09/10/2022)   Exercise Vital Sign    Days of Exercise per Week: 3 days    Minutes of Exercise per Session: 60 min  Stress: No Stress Concern Present (09/10/2022)   [place]    Feeling of Stress : Not at all  Social Connections: Moderately Integrated (09/10/2022)   Social Connection and Isolation Panel [NHANES]    Frequency of Communication with Friends and Family: More than three times a week    Frequency of Social Gatherings with Friends and Family: Three times a week    Attends Religious Services: More than 4 times per year    Active Member of Clubs or Organizations: Yes    Attends Music therapist: More than 4 times per year    Marital Status: Separated    Tobacco Counseling Counseling given: Not Answered   Clinical Intake:  Pre-visit  preparation completed: Yes  Pain : No/denies pain  Diabetes: No  How often do you need to have someone help you when you read instructions, pamphlets, or other written materials from your doctor or pharmacy?: 1 - Never  Diabetic?No   Interpreter Needed?: No  Information entered by :: Denman George LPN   Activities of Daily Living    09/10/2022   11:40 AM 07/21/2022   11:51 AM  In your present state of health, do you have any difficulty performing the following activities:  Hearing? 0   Vision? 0   Difficulty concentrating or making decisions? 0   Walking or climbing stairs? 0   Dressing or bathing? 0   Doing errands, shopping? 0 0  Preparing Food and eating ? N   Using the Toilet? N   In the past six months, have you accidently leaked urine? N   Do you have problems with loss of bowel control? N   Managing your Medications? N   Managing your Finances? N   Housekeeping or managing your Housekeeping? N     Patient Care Team: Kathyrn Drown, MD as PCP - General (Family Medicine) [name], MD as PCP - Electrophysiology (Cardiology) [name], MD as PCP - Cardiology (Cardiology) Chucky May, MD as Consulting Physician (Psychiatry) Zadie Rhine Clent Demark, MD as Consulting Physician (Ophthalmology)  Indicate any recent Medical Services you may have received from other than [facility] providers in the past year (date may be approximate).     Assessment:   This is a routine wellness examination for [name].  Hearing/Vision screen Hearing Screening - Comments:: Denies hearing difficulties  Vision Screening - Comments:: Wears rx glasses - up to date with routine eye exams with Dr. Zadie Rhine    Dietary issues and exercise activities discussed: Current Exercise Habits: Home exercise routine, Type of exercise: walking, Time (Minutes): 30, Frequency (Times/Week): 5, Weekly Exercise (Minutes/Week): 150, Intensity: Mild   Goals Addressed   None    Depression Screen     09/10/2022   10:08 AM 09/01/2022  10:27 AM 03/01/2022    9:56 AM 09/01/2021   10:01 AM 04/28/2021    9:27 AM 03/18/2021    2:12 PM 02/05/2021   11:10 AM  PHQ 2/9 Scores  PHQ - 2 Score 0 3 0 0 0 1 2  PHQ- 9 Score  7     7    Fall Risk    09/01/2022   10:27 AM 03/01/2022    9:56 AM 09/01/2021   10:06 AM 04/28/2021    9:27 AM 03/18/2021    2:10 PM  Fall Risk   Falls in the past year? 1 0 0 0 0  Number falls in past yr: 1 0 0 0   Injury with Fall? 1 0 0 0   Risk for fall due to :  No Fall Risks No Fall Risks No Fall Risks   Follow up  Falls evaluation completed Falls prevention discussed Falls evaluation completed     [place] Falls:  Any stairs in or around the home? No  If so, are there any without handrails? No  Home free of loose throw rugs in walkways, pet beds, electrical cords, etc? Yes  Adequate lighting in your home to reduce risk of falls? Yes   ASSISTIVE DEVICES UTILIZED TO PREVENT FALLS:  Life alert? No  Use of a cane, walker or w/c? No  Grab bars in the bathroom? Yes  Shower chair or bench in shower? No  Elevated toilet seat or a handicapped toilet? Yes   TIMED UP AND GO:  Was the test performed? Yes .  Length of time to ambulate 10 feet: 6 sec.   Gait steady and fast without use of assistive device  Cognitive Function:    12/12/2017    9:38 AM  MMSE - Mini Mental State Exam  Orientation to time 5  Orientation to Place 5  Registration 3  Attention/ Calculation 5  Recall 2  Language- name 2 objects 2  Language- repeat 1  Language- follow 3 step command 3  Language- read & follow direction 1  Write a sentence 1  Copy design 1  Total score 29        09/10/2022   11:40 AM  6CIT Screen  What Year? 0 points  What month? 0 points  What time? 0 points  Count back from 20 0 points  Months in reverse 0 points  Repeat phrase 0 points  Total Score 0 points    Immunizations Immunization History  Administered Date(s)  Administered   Influenza, High Dose Seasonal PF 05/19/2018   Influenza,inj,Quad PF,6+ Mos 05/30/2017, 06/09/2021   Influenza-Unspecified 07/21/2016, 06/10/2017, 05/19/2018, 08/08/2020   Moderna Sars-Covid-2 Vaccination 08/16/2019, 09/14/2019, 07/14/2020, 06/08/2021   Pneumococcal Conjugate-13 06/19/2018   Pneumococcal Polysaccharide-23 01/15/2020   Td 08/01/2013   Tdap 10/10/2017   Zoster Recombinat (Shingrix) 10/26/2021    TDAP status: Up to date  Flu Vaccine status: Up to date  Pneumococcal vaccine status: Up to date  Covid-19 vaccine status: Information provided on how to obtain vaccines.   Qualifies for Shingles Vaccine? Yes   Zostavax completed No   Shingrix Completed?: No.    Education has been provided regarding the importance of this vaccine. Patient has been advised to call insurance company to determine out of pocket expense if they have not yet received this vaccine. Advised may also receive vaccine at local pharmacy or Health Dept. Verbalized acceptance and understanding.  Screening Tests Health Maintenance  Topic Date Due  Zoster Vaccines- Shingrix (2 of 2) 12/21/2021   COVID-19 Vaccine (5 - 2023-24 season) 03/12/2022   COLONOSCOPY (Pts 45-85yr Insurance coverage will need to be confirmed)  07/24/2023   Medicare Annual Wellness (AWV)  09/10/2023   MAMMOGRAM  04/26/2024   DTaP/Tdap/Td (3 - Td or Tdap) 10/11/2027   Pneumonia Vaccine 70 Years old  Completed   INFLUENZA VACCINE  Completed   DEXA SCAN  Completed   Hepatitis C Screening  Completed   HPV VACCINES  Aged Out    Health Maintenance  Health Maintenance Due  Topic Date Due   Zoster Vaccines- Shingrix (2 of 2) 12/21/2021   COVID-19 Vaccine (5 - 2023-24 season) 03/12/2022    Colorectal cancer screening: Type of screening: Colonoscopy. Completed 07/23/22. Repeat every 1 years  Mammogram status: Completed 04/26/22. Repeat every year  Bone Density status: Completed 03/21/20. Results reflect: Bone  density results: NORMAL. Repeat every 2 years.  Lung Cancer Screening: (Low Dose CT Chest recommended if Age 70-80years, 30 pack-year currently smoking OR have quit w/in 15years.) does not qualify.   Lung Cancer Screening Referral: n/a  Additional Screening:  Hepatitis C Screening: does qualify; Completed 09/05/17  Vision Screening: Recommended annual ophthalmology exams for early detection of glaucoma and other disorders of the eye. Is the patient up to date with their annual eye exam?  Yes  Who is the provider or what is the name of the office in which the patient attends annual eye exams? Dr. RZadie RhineIf pt is not established with a provider, would they like to be referred to a provider to establish care? No .   Dental Screening: Recommended annual dental exams for proper oral hygiene  Community Resource Referral / Chronic Care Management: CRR required this visit?  No   CCM required this visit?  No      Plan:     I have personally reviewed and noted the following in the patient's chart:   Medical and social history Use of alcohol, tobacco or illicit drugs  Current medications and supplements including opioid prescriptions. Patient is not currently taking opioid prescriptions. Functional ability and status Nutritional status Physical activity Advanced directives List of other physicians Hospitalizations, surgeries, and ER visits in previous 12 months Vitals Screenings to include cognitive, depression, and falls Referrals and appointments  In addition, I have reviewed and discussed with patient certain preventive protocols, quality metrics, and best practice recommendations. A written personalized care plan for preventive services as well as general preventive health recommendations were provided to patient.     SDenman GeorgeBBessemer City [place]  [id number]  Nurse Notes: No concerns

## 2022-09-09 NOTE — Patient Instructions (Addendum)
[name] , Thank you for taking time to come for your Medicare Wellness Visit. I appreciate your ongoing commitment to your health goals. Please review the following plan we discussed and let me know if I can assist you in the future.   These are the goals we discussed:  Goals      Exercise 3x per week (30 min per time)     Continue to exercise. Do some volunteer work.         This is a list of the screening recommended for you and due dates:  Health Maintenance  Topic Date Due   Zoster (Shingles) Vaccine (2 of 2) 12/21/2021   COVID-19 Vaccine (5 - 2023-24 season) 03/12/2022   Medicare Annual Wellness Visit  09/01/2022   Colon Cancer Screening  07/24/2023   Mammogram  04/26/2024   DTaP/Tdap/Td vaccine (3 - Td or Tdap) 10/11/2027   Pneumonia Vaccine  Completed   Flu Shot  Completed   DEXA scan (bone density measurement)  Completed   Hepatitis C Screening: USPSTF Recommendation to screen - Ages 37-79 yo.  Completed   HPV Vaccine  Aged Out    Advanced directives: Please bring a copy of your health care power of attorney and living will to the office to be added to your chart at your convenience.   Conditions/risks identified: Aim for 30 minutes of exercise or brisk walking, 6-8 glasses of water, and 5 servings of fruits and vegetables each day.   Next appointment: Follow up in one year for your annual wellness visit    Preventive Care 65 Years and Older, Female Preventive care refers to lifestyle choices and visits with your health care provider that can promote health and wellness. What does preventive care include? A yearly physical exam. This is also called an annual well check. Dental exams once or twice a year. Routine eye exams. Ask your health care provider how often you should have your eyes checked. Personal lifestyle choices, including: Daily care of your teeth and gums. Regular physical activity. Eating a healthy diet. Avoiding tobacco and drug use. Limiting  alcohol use. Practicing safe sex. Taking low-dose aspirin every day. Taking vitamin and mineral supplements as recommended by your health care provider. What happens during an annual well check? The services and screenings done by your health care provider during your annual well check will depend on your age, overall health, lifestyle risk factors, and family history of disease. Counseling  Your health care provider may ask you questions about your: Alcohol use. Tobacco use. Drug use. Emotional well-being. Home and relationship well-being. Sexual activity. Eating habits. History of falls. Memory and ability to understand (cognition). Work and work Statistician. Reproductive health. Screening  You may have the following tests or measurements: Height, weight, and BMI. Blood pressure. Lipid and cholesterol levels. These may be checked every 5 years, or more frequently if you are over 33 years old. Skin check. Lung cancer screening. You may have this screening every year starting at age 2 if you have a 30-pack-year history of smoking and currently smoke or have quit within the past 15 years. Fecal occult blood test (FOBT) of the stool. You may have this test every year starting at age 65. Flexible sigmoidoscopy or colonoscopy. You may have a sigmoidoscopy every 5 years or a colonoscopy every 10 years starting at age 68. Hepatitis C blood test. Hepatitis B blood test. Sexually transmitted disease (STD) testing. Diabetes screening. This is done by checking your blood sugar (glucose)  after you have not eaten for a while (fasting). You may have this done every 1-3 years. Bone density scan. This is done to screen for osteoporosis. You may have this done starting at age 32. Mammogram. This may be done every 1-2 years. Talk to your health care provider about how often you should have regular mammograms. Talk with your health care provider about your test results, treatment options, and if  necessary, the need for more tests. Vaccines  Your health care provider may recommend certain vaccines, such as: Influenza vaccine. This is recommended every year. Tetanus, diphtheria, and acellular pertussis (Tdap, Td) vaccine. You may need a Td booster every 10 years. Zoster vaccine. You may need this after age 51. Pneumococcal 13-valent conjugate (PCV13) vaccine. One dose is recommended after age 36. Pneumococcal polysaccharide (PPSV23) vaccine. One dose is recommended after age 71. Talk to your health care provider about which screenings and vaccines you need and how often you need them. This information is not intended to replace advice given to you by your health care provider. Make sure you discuss any questions you have with your health care provider. Document Released: 07/25/2015 Document Revised: 03/17/2016 Document Reviewed: 04/29/2015 Elsevier Interactive Patient Education  2017 [place] Prevention in the Home Falls can cause injuries. They can happen to people of all ages. There are many things you can do to make your home safe and to help prevent falls. What can I do on the outside of my home? Regularly fix the edges of walkways and driveways and fix any cracks. Remove anything that might make you trip as you walk through a door, such as a raised step or threshold. Trim any bushes or trees on the path to your home. Use bright outdoor lighting. Clear any walking paths of anything that might make someone trip, such as rocks or tools. Regularly check to see if handrails are loose or broken. Make sure that both sides of any steps have handrails. Any raised decks and porches should have guardrails on the edges. Have any leaves, snow, or ice cleared regularly. Use sand or salt on walking paths during winter. Clean up any spills in your garage right away. This includes oil or grease spills. What can I do in the bathroom? Use night lights. Install grab bars by the toilet  and in the tub and shower. Do not use towel bars as grab bars. Use non-skid mats or decals in the tub or shower. If you need to sit down in the shower, use a plastic, non-slip stool. Keep the floor dry. Clean up any water that spills on the floor as soon as it happens. Remove soap buildup in the tub or shower regularly. Attach bath mats securely with double-sided non-slip rug tape. Do not have throw rugs and other things on the floor that can make you trip. What can I do in the bedroom? Use night lights. Make sure that you have a light by your bed that is easy to reach. Do not use any sheets or blankets that are too big for your bed. They should not hang down onto the floor. Have a firm chair that has side arms. You can use this for support while you get dressed. Do not have throw rugs and other things on the floor that can make you trip. What can I do in the kitchen? Clean up any spills right away. Avoid walking on wet floors. Keep items that you use a lot in easy-to-reach places. If  you need to reach something above you, use a strong step stool that has a grab bar. Keep electrical cords out of the way. Do not use floor polish or wax that makes floors slippery. If you must use wax, use non-skid floor wax. Do not have throw rugs and other things on the floor that can make you trip. What can I do with my stairs? Do not leave any items on the stairs. Make sure that there are handrails on both sides of the stairs and use them. Fix handrails that are broken or loose. Make sure that handrails are as long as the stairways. Check any carpeting to make sure that it is firmly attached to the stairs. Fix any carpet that is loose or worn. Avoid having throw rugs at the top or bottom of the stairs. If you do have throw rugs, attach them to the floor with carpet tape. Make sure that you have a light switch at the top of the stairs and the bottom of the stairs. If you do not have them, ask someone to add  them for you. What else can I do to help prevent falls? Wear shoes that: Do not have high heels. Have rubber bottoms. Are comfortable and fit you well. Are closed at the toe. Do not wear sandals. If you use a stepladder: Make sure that it is fully opened. Do not climb a closed stepladder. Make sure that both sides of the stepladder are locked into place. Ask someone to hold it for you, if possible. Clearly mark and make sure that you can see: Any grab bars or handrails. First and last steps. Where the edge of each step is. Use tools that help you move around (mobility aids) if they are needed. These include: Canes. Walkers. Scooters. Crutches. Turn on the lights when you go into a dark area. Replace any light bulbs as soon as they burn out. Set up your furniture so you have a clear path. Avoid moving your furniture around. If any of your floors are uneven, fix them. If there are any pets around you, be aware of where they are. Review your medicines with your doctor. Some medicines can make you feel dizzy. This can increase your chance of falling. Ask your doctor what other things that you can do to help prevent falls. This information is not intended to replace advice given to you by your health care provider. Make sure you discuss any questions you have with your health care provider. Document Released: 04/24/2009 Document Revised: 12/04/2015 Document Reviewed: 08/02/2014 Elsevier Interactive Patient Education  2017 [employer].

## 2022-09-10 ENCOUNTER — Ambulatory Visit (INDEPENDENT_AMBULATORY_CARE_PROVIDER_SITE_OTHER): Payer: PPO

## 2022-09-10 VITALS — BP 128/72 | Ht 64.0 in | Wt 264.4 lb

## 2022-09-10 DIAGNOSIS — H2000 Unspecified acute and subacute iridocyclitis: Secondary | ICD-10-CM | POA: Diagnosis not present

## 2022-09-10 DIAGNOSIS — Z961 Presence of intraocular lens: Secondary | ICD-10-CM | POA: Diagnosis not present

## 2022-09-10 DIAGNOSIS — H353131 Nonexudative age-related macular degeneration, bilateral, early dry stage: Secondary | ICD-10-CM | POA: Diagnosis not present

## 2022-09-10 DIAGNOSIS — H5711 Ocular pain, right eye: Secondary | ICD-10-CM | POA: Diagnosis not present

## 2022-09-10 DIAGNOSIS — Z0000 Encounter for general adult medical examination without abnormal findings: Secondary | ICD-10-CM

## 2022-09-10 DIAGNOSIS — H01024 Squamous blepharitis left upper eyelid: Secondary | ICD-10-CM | POA: Diagnosis not present

## 2022-09-10 DIAGNOSIS — H01021 Squamous blepharitis right upper eyelid: Secondary | ICD-10-CM | POA: Diagnosis not present

## 2022-09-10 DIAGNOSIS — H26493 Other secondary cataract, bilateral: Secondary | ICD-10-CM | POA: Diagnosis not present

## 2022-09-10 DIAGNOSIS — H34232 Retinal artery branch occlusion, left eye: Secondary | ICD-10-CM | POA: Diagnosis not present

## 2022-09-17 ENCOUNTER — Ambulatory Visit (HOSPITAL_COMMUNITY)
Admission: RE | Admit: 2022-09-17 | Discharge: 2022-09-17 | Disposition: A | Discharge status: Home / Self Care | Payer: PPO | Source: Ambulatory Visit | Attending: Family Medicine | Admitting: Family Medicine

## 2022-09-17 DIAGNOSIS — E785 Hyperlipidemia, unspecified: Secondary | ICD-10-CM | POA: Diagnosis not present

## 2022-09-17 DIAGNOSIS — I6523 Occlusion and stenosis of bilateral carotid arteries: Secondary | ICD-10-CM | POA: Diagnosis not present

## 2022-09-29 ENCOUNTER — Encounter: Payer: Self-pay | Admitting: Family Medicine

## 2022-09-29 DIAGNOSIS — R7303 Prediabetes: Secondary | ICD-10-CM

## 2022-09-30 ENCOUNTER — Ambulatory Visit (INDEPENDENT_AMBULATORY_CARE_PROVIDER_SITE_OTHER): Payer: PPO

## 2022-09-30 DIAGNOSIS — I495 Sick sinus syndrome: Secondary | ICD-10-CM

## 2022-09-30 LAB — CUP PACEART REMOTE DEVICE CHECK
Battery Voltage: 55
Date Time Interrogation Session: 20240321083403
Implantable Lead Connection Status: 753985
Implantable Lead Connection Status: 753985
Implantable Lead Implant Date: 20170627
Implantable Lead Implant Date: 20170627
Implantable Lead Location: 753859
Implantable Lead Location: 753860
Implantable Lead Model: 377
Implantable Lead Model: 377
Implantable Lead Serial Number: 49436227
Implantable Lead Serial Number: 49471106
Implantable Pulse Generator Implant Date: 20170627
Pulse Gen Model: 394969
Pulse Gen Serial Number: 68786455

## 2022-09-30 NOTE — Telephone Encounter (Signed)
Nurses-patient with prediabetes and obesity and requesting consultation with nutritionist please go ahead with the referral

## 2022-10-04 DIAGNOSIS — H353132 Nonexudative age-related macular degeneration, bilateral, intermediate dry stage: Secondary | ICD-10-CM | POA: Diagnosis not present

## 2022-10-04 DIAGNOSIS — H33101 Unspecified retinoschisis, right eye: Secondary | ICD-10-CM | POA: Diagnosis not present

## 2022-10-04 DIAGNOSIS — G4733 Obstructive sleep apnea (adult) (pediatric): Secondary | ICD-10-CM | POA: Diagnosis not present

## 2022-10-04 DIAGNOSIS — H43821 Vitreomacular adhesion, right eye: Secondary | ICD-10-CM | POA: Diagnosis not present

## 2022-10-04 DIAGNOSIS — H35351 Cystoid macular degeneration, right eye: Secondary | ICD-10-CM | POA: Diagnosis not present

## 2022-10-04 DIAGNOSIS — H34232 Retinal artery branch occlusion, left eye: Secondary | ICD-10-CM | POA: Diagnosis not present

## 2022-10-04 DIAGNOSIS — H3321 Serous retinal detachment, right eye: Secondary | ICD-10-CM | POA: Diagnosis not present

## 2022-10-04 DIAGNOSIS — H353211 Exudative age-related macular degeneration, right eye, with active choroidal neovascularization: Secondary | ICD-10-CM | POA: Diagnosis not present

## 2022-10-04 DIAGNOSIS — H35071 Retinal telangiectasis, right eye: Secondary | ICD-10-CM | POA: Diagnosis not present

## 2022-10-06 DIAGNOSIS — H26493 Other secondary cataract, bilateral: Secondary | ICD-10-CM | POA: Diagnosis not present

## 2022-10-06 DIAGNOSIS — H353131 Nonexudative age-related macular degeneration, bilateral, early dry stage: Secondary | ICD-10-CM | POA: Diagnosis not present

## 2022-10-06 DIAGNOSIS — H5711 Ocular pain, right eye: Secondary | ICD-10-CM | POA: Diagnosis not present

## 2022-10-06 DIAGNOSIS — Z961 Presence of intraocular lens: Secondary | ICD-10-CM | POA: Diagnosis not present

## 2022-10-06 DIAGNOSIS — H01021 Squamous blepharitis right upper eyelid: Secondary | ICD-10-CM | POA: Diagnosis not present

## 2022-10-06 DIAGNOSIS — H2000 Unspecified acute and subacute iridocyclitis: Secondary | ICD-10-CM | POA: Diagnosis not present

## 2022-10-06 DIAGNOSIS — H01024 Squamous blepharitis left upper eyelid: Secondary | ICD-10-CM | POA: Diagnosis not present

## 2022-10-06 DIAGNOSIS — H34232 Retinal artery branch occlusion, left eye: Secondary | ICD-10-CM | POA: Diagnosis not present

## 2022-10-13 ENCOUNTER — Encounter: Payer: PPO | Attending: Family Medicine | Admitting: Nutrition

## 2022-10-13 DIAGNOSIS — R7303 Prediabetes: Secondary | ICD-10-CM | POA: Insufficient documentation

## 2022-10-13 DIAGNOSIS — I10 Essential (primary) hypertension: Secondary | ICD-10-CM | POA: Insufficient documentation

## 2022-10-13 NOTE — Patient Instructions (Signed)
Goals  Get back to planning meals Stop thinking about going to Cookout Work on eating more foods from a garden Talk to [employer] about going to [employer]

## 2022-10-13 NOTE — Progress Notes (Signed)
Medical Nutrition Therapy  Appointment Start time:  [phone]  Appointment End time:  1645  Primary concerns today: Pre DM, Obesity, HTN  Referral diagnosis: R73.03, E66.01 Preferred learning style: Hands on, auditory. Has vision issues.  Learning readiness: Ready   NUTRITION ASSESSMENT  72  yr old [place] here for obesity, prediabetes and Hyperlipidemia Had lost 26 lbs from July to Jan. Was involved with the  PREP program through [facility] program. Feels better. Still wishes to make more lifestyle changes. 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 Is emotionally upset with the thought of losing some of her eye sight.  Has gotten out of the habit to planning her meals and that helped a lot in the past. Willing to work on that.  Gained 4 lbs since Feb 2024.  She is willing to work on Lifestyle Medicine to improve her health, lose weight and reverse her prediabetes.   Anthropometrics  Wt Readings from Last 3 Encounters:  09/10/22 264 lb 6.4 oz (119.9 kg)  09/02/22 262 lb (118.8 kg)  09/01/22 260 lb 3.2 oz (118 kg)   Ht Readings from Last 3 Encounters:  09/10/22 5\' 4"  (1.626 m)  09/02/22 5\' 4"  (1.626 m)  08/03/22 5\' 4"  (1.626 m)   There is no height or weight on file to calculate BMI. @BMIFA @ Facility age limit for growth %iles is 20 years. Facility age limit for growth %iles is 20 years.    Clinical Medical Hx: See chart Medications: See chart Labs:     Latest Ref Rng & Units 08/31/2022    8:12 AM 07/21/2022   11:39 AM 02/08/2022    9:56 AM  CMP  Glucose 70 - 99 mg/dL 202  334  356   BUN 8 - 27 mg/dL 18  20  7    Creatinine 0.57 - 1.00 mg/dL 8.61  6.83  7.29   Sodium 134 - 144 mmol/L 140  138  144   Potassium 3.5 - 5.2 mmol/L 4.7  4.2  4.1   Chloride 96 - 106 mmol/L 100  100  102   CO2 20 - 29 mmol/L 25  29  24    Calcium 8.9 - 10.3 mg/dL  9.7  9.6   Total Protein 6.0 - 8.5 g/dL 7.4   6.7   Total Bilirubin 0.0 - 1.2 mg/dL 0.5   0.6    Alkaline Phos 44 - 121 IU/L 126   93   AST 0 - 40 IU/L 24   24   ALT 0 - 32 IU/L 27   16    Lipid Panel     Component Value Date/Time   CHOL 132 08/31/2022 0812   TRIG 191 (H) 08/31/2022 0812   HDL 48 08/31/2022 0812   CHOLHDL 2.8 08/31/2022 0812   CHOLHDL 5.0 02/06/2020 0859   VLDL 46 (H) 02/06/2020 0859   LDLCALC 53 08/31/2022 0812   LABVLDL 31 08/31/2022 0812   Lab Results  Component Value Date   HGBA1C 6.1 (H) 08/31/2022    Notable Signs/Symptoms: Limited eye sight  Lifestyle & Dietary Hx LIves with her niece. Eats at home and Eats out some.  Estimated daily fluid intake: 64 oz Supplements: MVI,  Sleep: good Stress / self-care: a lot over her eye issues.  Current average weekly physical activity: ADL can't do a lot due to her eye issues.  24-Hr Dietary Recall Eats 3 meals per day   Estimated Energy Needs Calories: 1200 Carbohydrate:  135g Protein: 90g Fat: 33g   NUTRITION DIAGNOSIS  NI-1.7 Predicted excessive energy intake As related to Obesity and prediabetes.  As evidenced by A1c 5.8% and BMI 45.   NUTRITION INTERVENTION  Nutrition education (E-1) on the following topics:  Nutrition and Diabetes education provided on My Plate, CHO counting, meal planning, portion sizes, timing of meals, avoiding snacks between meals unless having a low blood sugar, target ranges for A1C and blood sugars, signs/symptoms and treatment of hyper/hypoglycemia, monitoring blood sugars, taking medications as prescribed, benefits of exercising 30 minutes per day and prevention of complications of DM.  Lifestyle Medicine  - Whole Food, Plant Predominant Nutrition is highly recommended: Eat Plenty of vegetables, Mushrooms, fruits, Legumes, Whole Grains, Nuts, seeds in lieu of processed meats, processed snacks/pastries red meat, poultry, eggs.    -It is better to avoid simple carbohydrates including: Cakes, Sweet Desserts, Ice Cream, Soda (diet and regular), Sweet Tea, Candies,  Chips, Cookies, Store Bought Juices, Alcohol in Excess of  1-2 drinks a day, Lemonade,  Artificial Sweeteners, Doughnuts, Coffee Creamers, "Sugar-free" Products, etc, etc.  This is not a complete list.....  Exercise: If you are able: 30 -60 minutes a day ,4 days a week, or 150 minutes a week.  The longer the better.  Combine stretch, strength, and aerobic activities.  If you were told in the past that you have high risk for cardiovascular diseases, you may seek evaluation by your heart doctor prior to initiating moderate to intense exercise programs.    Handouts Provided Include  Lifestyle Medicine   Learning Style & Readiness for Change Teaching method utilized: Visual & Auditory  Demonstrated degree of understanding via: Teach Back  Barriers to learning/adherence to lifestyle change: none  Goals Established by Pt Goals  Get back to planning meals Stop thinking about going to Cookout Work on eating more foods from a garden [facility] to water aerobics   MONITORING & EVALUATION Dietary intake, weekly physical activity, and weight in 1 month..  Next Steps  Patient is to work on meal planning..[name]

## 2022-10-18 ENCOUNTER — Encounter: Payer: Self-pay | Admitting: Nutrition

## 2022-10-19 DIAGNOSIS — F4323 Adjustment disorder with mixed anxiety and depressed mood: Secondary | ICD-10-CM | POA: Diagnosis not present

## 2022-10-20 DIAGNOSIS — G4733 Obstructive sleep apnea (adult) (pediatric): Secondary | ICD-10-CM | POA: Diagnosis not present

## 2022-11-02 NOTE — Progress Notes (Signed)
Remote pacemaker transmission.   

## 2022-11-03 DIAGNOSIS — F4323 Adjustment disorder with mixed anxiety and depressed mood: Secondary | ICD-10-CM | POA: Diagnosis not present

## 2022-11-09 DIAGNOSIS — Z0289 Encounter for other administrative examinations: Secondary | ICD-10-CM

## 2022-11-10 ENCOUNTER — Encounter (INDEPENDENT_AMBULATORY_CARE_PROVIDER_SITE_OTHER): Payer: PPO | Admitting: Family Medicine

## 2022-11-15 ENCOUNTER — Encounter: Payer: Self-pay | Admitting: Family Medicine

## 2022-11-15 DIAGNOSIS — H353211 Exudative age-related macular degeneration, right eye, with active choroidal neovascularization: Secondary | ICD-10-CM | POA: Diagnosis not present

## 2022-11-15 DIAGNOSIS — H35071 Retinal telangiectasis, right eye: Secondary | ICD-10-CM | POA: Diagnosis not present

## 2022-11-15 DIAGNOSIS — H3321 Serous retinal detachment, right eye: Secondary | ICD-10-CM | POA: Diagnosis not present

## 2022-11-15 DIAGNOSIS — H35351 Cystoid macular degeneration, right eye: Secondary | ICD-10-CM | POA: Diagnosis not present

## 2022-11-15 DIAGNOSIS — H353132 Nonexudative age-related macular degeneration, bilateral, intermediate dry stage: Secondary | ICD-10-CM | POA: Diagnosis not present

## 2022-11-15 DIAGNOSIS — H43821 Vitreomacular adhesion, right eye: Secondary | ICD-10-CM | POA: Diagnosis not present

## 2022-11-15 LAB — HM DIABETES EYE EXAM

## 2022-11-17 ENCOUNTER — Encounter: Payer: Self-pay | Admitting: Internal Medicine

## 2022-11-17 ENCOUNTER — Ambulatory Visit: Payer: PPO | Attending: Internal Medicine | Admitting: Internal Medicine

## 2022-11-17 VITALS — BP 156/84 | HR 64 | Ht 64.0 in | Wt 260.0 lb

## 2022-11-17 DIAGNOSIS — I495 Sick sinus syndrome: Secondary | ICD-10-CM

## 2022-11-17 NOTE — Patient Instructions (Signed)
Medication Instructions:  Your physician recommends that you continue on your current medications as directed. Please refer to the Current Medication list given to you today.  *If you need a refill on your cardiac medications before your next appointment, please call your pharmacy*   Lab Work: NONE   If you have labs (blood work) drawn today and your tests are completely normal, you will receive your results only by: MyChart Message (if you have MyChart) OR A paper copy in the mail If you have any lab test that is abnormal or we need to change your treatment, we will call you to review the results.   Testing/Procedures: NONE    Follow-Up: At [facility], you and your health needs are our priority.  As part of our continuing mission to provide you with exceptional heart care, we have created designated Provider Care Teams.  These Care Teams include your primary Cardiologist (physician) and Advanced Practice Providers (APPs -  Physician Assistants and Nurse Practitioners) who all work together to provide you with the care you need, when you need it.  We recommend signing up for the patient portal called "MyChart".  Sign up information is provided on this After Visit Summary.  MyChart is used to connect with patients for Virtual Visits (Telemedicine).  Patients are able to view lab/test results, encounter notes, upcoming appointments, etc.  Non-urgent messages can be sent to your provider as well.   To learn more about what you can do with MyChart, go to [email or web address].    Your next appointment:   1 year(s)  Provider:   Gregg Taylor, MD    Other Instructions Thank you for choosing [facility]!    

## 2022-11-17 NOTE — Progress Notes (Signed)
HPI [name] returns today for follow-up.  She is a very pleasant 72 year old retired Engineer, civil (consulting) with a history of sinus node dysfunction status post pacemaker insertion, morbid obesity, hypertension, diastolic heart failure, and acid reflux induced bronchospasm.  She has had occasional palpitations but for the most part her symptoms have been controlled.  She has class II dyspnea which is multifactorial.  She denies chest pain.  She has not had syncope. Her weight is   Allergies  Allergen Reactions   Imipramine Rash   Tricor [Fenofibrate] Other (See Comments)    Pain, "aching"     Current Outpatient Medications  Medication Sig Dispense Refill   acetaminophen (TYLENOL) 650 MG CR tablet Take 1,300 mg by mouth 2 (two) times daily.     ALPRAZolam (XANAX XR) 1 MG 24 hr tablet Take 1 mg by mouth every morning.     ALPRAZolam (XANAX) 0.5 MG tablet Take 0.5 mg by mouth daily as needed for anxiety.     aspirin EC 81 MG tablet Take 1 tablet (81 mg total) by mouth daily. Swallow whole. 90 tablet 3   Cholecalciferol (VITAMIN D3) 1.25 MG (50000 UT) CAPS Take 50,000 Units by mouth once a week. Sunday     glucosamine-chondroitin 500-400 MG tablet Take 2 tablets by mouth every morning.      lamoTRIgine (LAMICTAL) 200 MG tablet Take 400 mg by mouth at bedtime.     Lutein 20 MG CAPS Take 20 mg by mouth every morning.     Multiple Vitamin (MULTIVITAMIN) tablet Take 1 tablet by mouth daily. Senior     prednisoLONE acetate (PRED FORTE) 1 % ophthalmic suspension Place 1 drop into the right eye daily.     QUEtiapine (SEROQUEL) 300 MG tablet Take 600 mg by mouth at bedtime.     rosuvastatin (CRESTOR) 10 MG tablet TAKE 1 TABLET(10 MG) BY MOUTH DAILY 90 tablet 3   bevacizumab (AVASTIN) 1.25 mg/0.1 mL SOLN 1.25 mg by Intravitreal route to Surgery. Every 6 weeks. (Patient not taking: Reported on 11/17/2022)     triamcinolone cream (KENALOG) 0.1 % Apply 1 Application topically 2 (two) times daily. (Patient not  taking: Reported on 10/13/2022) 45 g 1   No current facility-administered medications for this visit.     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 Ladona [name]   Sleep apnea     ROS:   All systems reviewed and negative except as noted in the HPI.   Past Surgical History:  Procedure Laterality Date   ABDOMINAL HYSTERECTOMY     APPENDECTOMY     BREAST BIOPSY Left 2011   BREAST BIOPSY  2008   BREAST LUMPECTOMY Left 2008   CHOLECYSTECTOMY  COLONOSCOPY N/A 09/13/2013   Procedure: COLONOSCOPY;  Surgeon: Andrea Hippo, MD;  Location: [facility];  Service: Endoscopy;  Laterality: N/A;  730   COLONOSCOPY WITH PROPOFOL N/A 07/23/2022   Procedure: COLONOSCOPY WITH PROPOFOL;  Surgeon: Andrea Bal, DO;  Location: [facility];  Service: Endoscopy;  Laterality: N/A;  11:30 am   EP IMPLANTABLE DEVICE N/A 01/06/2016   Procedure: Pacemaker Implant;  Surgeon: Andrea Maw, MD;  Location: [facility] INVASIVE CV LAB;  Service: Cardiovascular;  Laterality: N/A;   ESOPHAGOGASTRODUODENOSCOPY N/A 01/17/2014   Procedure: ESOPHAGOGASTRODUODENOSCOPY (EGD);  Surgeon: Andrea Hippo, MD;  Location: [facility];  Service: Endoscopy;  Laterality: N/A;  245   POLYPECTOMY  07/23/2022   Procedure: POLYPECTOMY;  Surgeon: Andrea Bal, DO;  Location: [facility];  Service:  Endoscopy;;   RIGHT/LEFT HEART CATH AND CORONARY ANGIOGRAPHY N/A 01/01/2020   Procedure: RIGHT/LEFT HEART CATH AND CORONARY ANGIOGRAPHY;  Surgeon: Andrea Hazel, MD;  Location: [facility] INVASIVE CV LAB;  Service: Cardiovascular;  Laterality: N/A;   TOTAL KNEE ARTHROPLASTY Bilateral right knee   2012, 2007     Family History  Problem Relation Age of Onset   Breast cancer Mother    Bipolar disorder Mother    Diabetes Mother    Alcohol abuse Father    Hypertension Father    Bipolar disorder Sister    Diabetes Sister    Heart disease Other      Social History   Socioeconomic History   Marital status: Married    Spouse name: Not on file   Number of children: 3   Years of education: college   Highest education level: Associate degree: occupational, Scientist, product/process development, or vocational program  Occupational History   Occupation: Charity fundraiser  Tobacco Use   Smoking status: Never    Passive exposure: Never   Smokeless tobacco: Never  Vaping Use   Vaping Use: Never used  Substance and Sexual Activity   Alcohol use: No    Alcohol/week: 0.0 standard drinks of alcohol   Drug use: No   Sexual activity: Not Currently  Other Topics Concern   Not on file  Social History Narrative   04/24/2013 [name] was born and grew up in [place]. She reports that her childhood was "tough." She has 2 older sisters and a younger brother. She achieved an Associates Degree in nursing. She has been working as an Charity fundraiser for 40 years. She is separated from her husband for 6 years. She has 2 daughters and one son. She denies any legal difficulties. She affiliates as Curator. Her hobbies include reading, sewing, crafts, and cooking. She reports that her social support system consists of her friend and passed her. 04/24/2013 Andrea      Lives alone.   Right-handed.   No caffeine use.   Social Determinants of Health   Financial Resource Strain: Low Risk  (09/10/2022)   Overall Financial Resource Strain (CARDIA)     Difficulty of Paying Living Expenses: Not hard at all  Food Insecurity: No Food Insecurity (09/10/2022)   Hunger Vital Sign    Worried About Running Out of Food in the Last Year: Never true    Ran Out of Food in the Last Year: Never true  Transportation Needs: No Transportation Needs (09/10/2022)   PRAPARE - Administrator, Civil Service (Medical): No    Lack of Transportation (Non-Medical): No  Physical Activity: Sufficiently Active (09/10/2022)   Exercise Vital Sign  Days of Exercise per Week: 3 days    Minutes of Exercise per Session: 60 min  Stress: No Stress Concern Present (09/10/2022)   [employer] of Occupational Health - Occupational Stress Questionnaire    Feeling of Stress : Not at all  Social Connections: Moderately Integrated (09/10/2022)   Social Connection and Isolation Panel [NHANES]    Frequency of Communication with Friends and Family: More than three times a week    Frequency of Social Gatherings with Friends and Family: Three times a week    Attends Religious Services: More than 4 times per year    Active Member of Clubs or Organizations: Yes    Attends Banker Meetings: More than 4 times per year    Marital Status: Separated  Intimate Partner Violence: Not At Risk (09/10/2022)   Humiliation, Afraid, Rape, and Kick questionnaire    Fear of Current or Ex-Partner: No    Emotionally Abused: No    Physically Abused: No    Sexually Abused: No     BP (!) 156/84   Pulse 64   Ht 5\' 4"  (1.626 m)   Wt 260 lb (117.9 kg)   SpO2 94%   BMI 44.63 kg/m   Physical Exam:  Well appearing NAD HEENT: Unremarkable Neck:  No JVD, no thyromegally Lymphatics:  No adenopathy Back:  No CVA tenderness Lungs:  Clear HEART:  Regular rate rhythm, no murmurs, no rubs, no clicks Abd:  soft, positive bowel sounds, no organomegally, no rebound, no guarding Ext:  2 plus pulses, no edema, no cyanosis, no clubbing Skin:  No rashes no nodules Neuro:  CN II  through XII intact, motor grossly intact  EKG  DEVICE  Normal device function.  See PaceArt for details.   Assess/Plan:  1.Obesity - she has lost 20 lbs! She is encouraged to eat less and increase her activity. We discussed consuming more protein and eating fewer carbs. 2. Diastolic heart failure - I encouraged her to avoid salty foods and to lose weight. 3. Sinus node dysfunction - she is asymptomatic, s/p PPM insertion. 4. PPM - her Biotronik DDD PM is working normally.   Sharlot Gowda Jacobus Colvin,MD

## 2022-11-20 ENCOUNTER — Emergency Department (HOSPITAL_COMMUNITY): Payer: PPO

## 2022-11-20 ENCOUNTER — Emergency Department (HOSPITAL_COMMUNITY)
Admission: EM | Admit: 2022-11-20 | Discharge: 2022-11-20 | Disposition: A | Discharge status: Home / Self Care | Payer: PPO | Attending: Emergency Medicine | Admitting: Emergency Medicine

## 2022-11-20 ENCOUNTER — Other Ambulatory Visit: Payer: Self-pay

## 2022-11-20 DIAGNOSIS — Z7982 Long term (current) use of aspirin: Secondary | ICD-10-CM | POA: Diagnosis not present

## 2022-11-20 DIAGNOSIS — Z950 Presence of cardiac pacemaker: Secondary | ICD-10-CM | POA: Diagnosis not present

## 2022-11-20 DIAGNOSIS — I509 Heart failure, unspecified: Secondary | ICD-10-CM | POA: Diagnosis not present

## 2022-11-20 DIAGNOSIS — R0789 Other chest pain: Secondary | ICD-10-CM | POA: Diagnosis not present

## 2022-11-20 DIAGNOSIS — R0602 Shortness of breath: Secondary | ICD-10-CM | POA: Insufficient documentation

## 2022-11-20 DIAGNOSIS — I110 Hypertensive heart disease with heart failure: Secondary | ICD-10-CM | POA: Diagnosis not present

## 2022-11-20 DIAGNOSIS — R7309 Other abnormal glucose: Secondary | ICD-10-CM | POA: Insufficient documentation

## 2022-11-20 DIAGNOSIS — R110 Nausea: Secondary | ICD-10-CM | POA: Insufficient documentation

## 2022-11-20 DIAGNOSIS — R079 Chest pain, unspecified: Secondary | ICD-10-CM

## 2022-11-20 LAB — CBC
HCT: 40.9 % (ref 36.0–46.0)
Hemoglobin: 13.1 g/dL (ref 12.0–15.0)
MCH: 31.6 pg (ref 26.0–34.0)
MCHC: 32 g/dL (ref 30.0–36.0)
MCV: 98.8 fL (ref 80.0–100.0)
Platelets: 206 10*3/uL (ref 150–400)
RBC: 4.14 MIL/uL (ref 3.87–5.11)
RDW: 13.2 % (ref 11.5–15.5)
WBC: 5.7 10*3/uL (ref 4.0–10.5)
nRBC: 0 % (ref 0.0–0.2)

## 2022-11-20 LAB — COMPREHENSIVE METABOLIC PANEL
ALT: 22 U/L (ref 0–44)
AST: 29 U/L (ref 15–41)
Albumin: 4.3 g/dL (ref 3.5–5.0)
Alkaline Phosphatase: 96 U/L (ref 38–126)
Anion gap: 10 (ref 5–15)
BUN: 22 mg/dL (ref 8–23)
CO2: 26 mmol/L (ref 22–32)
Calcium: 9.4 mg/dL (ref 8.9–10.3)
Chloride: 101 mmol/L (ref 98–111)
Creatinine, Ser: 0.76 mg/dL (ref 0.44–1.00)
GFR, Estimated: >60 mL/min (ref >60)
Glucose, Bld: 138 mg/dL — ABNORMAL HIGH (ref 70–99)
Potassium: 4 mmol/L (ref 3.5–5.1)
Sodium: 137 mmol/L (ref 135–145)
Total Bilirubin: 0.6 mg/dL (ref 0.3–1.2)
Total Protein: 7.4 g/dL (ref 6.5–8.1)

## 2022-11-20 LAB — CBG MONITORING, ED: Glucose-Capillary: 137 mg/dL — ABNORMAL HIGH (ref 70–99)

## 2022-11-20 LAB — TROPONIN I (HIGH SENSITIVITY)
Troponin I (High Sensitivity): 4 ng/L (ref <18)
Troponin I (High Sensitivity): 5 ng/L (ref <18)

## 2022-11-20 LAB — D-DIMER, QUANTITATIVE: D-Dimer, Quant: <0.27 ug/mL-FEU (ref 0.00–0.50)

## 2022-11-20 MED ORDER — SODIUM CHLORIDE 0.9 % IV SOLN: INTRAVENOUS | Status: DC

## 2022-11-20 MED ORDER — ASPIRIN 81 MG PO CHEW
243.0000 mg | CHEWABLE_TABLET | Freq: Once | ORAL | Status: AC
  Administered 2022-11-20: 243 mg via ORAL
  Filled 2022-11-20: qty 3

## 2022-11-20 MED ORDER — ONDANSETRON HCL 4 MG/2ML IJ SOLN
4.0000 mg | Freq: Once | INTRAMUSCULAR | Status: AC
  Administered 2022-11-20: 4 mg via INTRAVENOUS
  Filled 2022-11-20: qty 2

## 2022-11-20 MED ORDER — NITROGLYCERIN 0.4 MG SL SUBL
0.4000 mg | SUBLINGUAL_TABLET | SUBLINGUAL | Status: DC | PRN
  Administered 2022-11-20: 0.4 mg via SUBLINGUAL
  Filled 2022-11-20: qty 1

## 2022-11-20 NOTE — ED Triage Notes (Signed)
Pt arrived POV. Presents with chest pain x58minutes. Sudden onset of squeezing sensation. No radiation. Constant pain. 8-10. Pacemake in place for heart failure. No other cardiac history. Pt endorses nausea and shortness of breath. Denies any other complaints.

## 2022-11-20 NOTE — ED Notes (Signed)
Patient now complaining of SOB

## 2022-11-20 NOTE — ED Provider Notes (Signed)
[place] EMERGENCY DEPARTMENT AT [facility] Provider Note   CSN: [id number] Arrival date & time: 11/20/22  1010     History  Chief Complaint  Patient presents with   Chest Pain    [name] is a 72 y.o. female with a history including hypertension, CHF, hyperlipidemia and presence of pacemaker presenting for evaluation of chest pressure and shortness of breath which started approximately 30 minutes prior to arrival.  She describes initially a squeezing sensation which has improved but does have persistent heaviness in her midsternal region.  There is no radiation of pain, she endorses having some nausea and some mild shortness of breath which persists.  She does take a aspirin 81 mg daily which she took this morning.  She was not exerting when her symptoms began but was participating in a church breakfast and friend at bedside states she was under little bit of stress as she was helping to plan today's event.  No history of hypercoagulability.  She denies extremity pain or swelling.  Review of chart indicates she had a completely normal cardiac cath in 2021.  The history is provided by the patient.       Home Medications Prior to Admission medications   Medication Sig Start Date End Date Taking? Authorizing Provider  acetaminophen (TYLENOL) 650 MG CR tablet Take 1,300 mg by mouth 2 (two) times daily.   Yes [provider]  ALPRAZolam (XANAX XR) 1 MG 24 hr tablet Take 1 mg by mouth every morning.   Yes [provider]  ALPRAZolam Prudy Feeler) 0.5 MG tablet Take 0.5 mg by mouth daily as needed for anxiety.   Yes [provider]  aspirin EC 81 MG tablet Take 1 tablet (81 mg total) by mouth daily. Swallow whole. 02/04/22  Yes Jonelle Sidle, MD  bevacizumab (AVASTIN) 1.25 mg/0.1 mL SOLN 1.25 mg by Intravitreal route to Surgery. Every 6 weeks.   Yes [provider]  Cholecalciferol (VITAMIN D3) 1.25 MG (50000 UT) CAPS Take 50,000 Units  by mouth every Sunday. Sunday 08/07/21  Yes [provider]  glucosamine-chondroitin 500-400 MG tablet Take 2 tablets by mouth every morning.    Yes [provider]  lamoTRIgine (LAMICTAL) 200 MG tablet Take 400 mg by mouth at bedtime.   Yes [provider]  Lutein 20 MG CAPS Take 20 mg by mouth every morning. 01/28/21  Yes [provider]  Multiple Vitamin (MULTIVITAMIN) tablet Take 1 tablet by mouth daily. Senior   Yes [provider]  prednisoLONE acetate (PRED FORTE) 1 % ophthalmic suspension Place 1 drop into the right eye daily. 08/20/22  Yes [provider]  QUEtiapine (SEROQUEL) 300 MG tablet Take 600 mg by mouth at bedtime. 05/07/13  Yes Margit Banda D, MD  rosuvastatin (CRESTOR) 10 MG tablet TAKE 1 TABLET(10 MG) BY MOUTH DAILY Patient taking differently: Take 10 mg by mouth in the morning. 02/02/22  Yes Jonelle Sidle, MD  triamcinolone cream (KENALOG) 0.1 % Apply 1 Application topically 2 (two) times daily. Patient not taking: Reported on 10/13/2022 08/03/22   Babs Sciara, MD      Allergies    Imipramine and Tricor [fenofibrate]    Review of Systems   Review of Systems  Constitutional:  Negative for chills and fever.  HENT:  Negative for congestion and sore throat.   Eyes: Negative.   Respiratory:  Positive for shortness of breath. Negative for chest tightness.   Cardiovascular:  Positive for chest pain.  Negative for palpitations and leg swelling.  Gastrointestinal:  Positive for nausea. Negative for abdominal pain.  Genitourinary: Negative.   Musculoskeletal:  Negative for arthralgias, joint swelling and neck pain.  Skin: Negative.  Negative for rash and wound.  Neurological:  Negative for dizziness, weakness, light-headedness, numbness and headaches.  Psychiatric/Behavioral: Negative.      Physical Exam Updated Vital Signs BP (!) 160/72   Pulse 66   Temp 97.6 F (36.4 C) (Oral)   Resp (!) 36   Ht 5\' 4"   (1.626 m)   Wt 117.9 kg   SpO2 100%   BMI 44.63 kg/m  Physical Exam Vitals and nursing note reviewed.  Constitutional:      Appearance: She is well-developed.  HENT:     Head: Normocephalic and atraumatic.  Eyes:     Conjunctiva/sclera: Conjunctivae normal.  Cardiovascular:     Rate and Rhythm: Normal rate and regular rhythm.     Heart sounds: Normal heart sounds.  Pulmonary:     Effort: Pulmonary effort is normal.     Breath sounds: Normal breath sounds. No wheezing.  Abdominal:     General: Bowel sounds are normal.     Palpations: Abdomen is soft.     Tenderness: There is no abdominal tenderness.  Musculoskeletal:        General: Normal range of motion.     Cervical back: Normal range of motion.     Right lower leg: No tenderness. No edema.     Left lower leg: No tenderness. Edema present.     Comments: Trace edema left (pt states chronic from old fracture)  Skin:    General: Skin is warm and dry.  Neurological:     Mental Status: She is alert.     ED Results / Procedures / Treatments   Labs (all labs ordered are listed, but only abnormal results are displayed) Labs Reviewed  COMPREHENSIVE METABOLIC PANEL - Abnormal; Notable for the following components:      Result Value   Glucose, Bld 138 (*)    All other components within normal limits  CBG MONITORING, ED - Abnormal; Notable for the following components:   Glucose-Capillary 137 (*)    All other components within normal limits  CBC  D-DIMER, QUANTITATIVE  TROPONIN I (HIGH SENSITIVITY)  TROPONIN I (HIGH SENSITIVITY)    EKG None ED ECG REPORT   Date: 11/20/2022  Rate: 60  Rhythm: normal sinus rhythm  QRS Axis: normal  Intervals: normal  ST/T Wave abnormalities: normal  Conduction Disutrbances:none  Narrative Interpretation:   Old EKG Reviewed: unchanged  I have personally reviewed the EKG tracing and agree with the computerized printout as noted.  Radiology DG Chest 1 View  Result Date:  11/20/2022 CLINICAL DATA:  Shortness of breath. EXAM: CHEST  1 VIEW COMPARISON:  Chest x-ray April 20, 2021 FINDINGS: Stable pacemaker. The heart, hila, and mediastinum are unchanged. No pneumothorax. No nodules or masses. No focal infiltrates. IMPRESSION: No active disease. Electronically Signed   By: Gerome Sam III M.D.   On: 11/20/2022 11:00    Procedures Procedures    Medications Ordered in ED Medications  0.9 %  sodium chloride infusion (0 mLs Intravenous Stopped 11/20/22 1408)  nitroGLYCERIN (NITROSTAT) SL tablet 0.4 mg (0.4 mg Sublingual Given 11/20/22 1104)  aspirin chewable tablet 243 mg (243 mg Oral Given 11/20/22 1059)  ondansetron (ZOFRAN) injection 4 mg (4 mg Intravenous Given 11/20/22 1104)    ED Course/ Medical Decision Making/ A&P  Medical Decision Making Pt presenting with chest pressure and sob starting about 30 min prior to arrival, not exertion but was having some stress with helping to coordinate a breakfast at her church. No cp or palpitations suggesting this was a pacer event.  Ddx including acs, chf, pneumonia, PE.  Gerd, anxiety.   Amount and/or Complexity of Data Reviewed Labs: ordered.    Details: Labs including delta trops and d dimer are negative.   Radiology: ordered and independent interpretation performed.    Details: Cxr negative.  Reviewed and agree with read. ECG/medicine tests: ordered and independent interpretation performed.    Details: Nsr, rate 60  Risk OTC drugs. Decision regarding hospitalization. Risk Details: Pt presenting with cp with significant risk factors including htn, lipidemia and age, heart score 5.  However,  she had a clean cardiac cath in 2021.  Given negative workup today and no coronary disease 3 years ago,  unstable angina highly unlikely.  We discussed admission vs close outpt f/u with cardiology including pt in discussion.   Reasonable for outpt f/u, pt agrees with this plan.  Strict return  precautions discussed.             Final Clinical Impression(s) / ED Diagnoses Final diagnoses:  Nonspecific chest pain    Rx / DC Orders ED Discharge Orders     None         Victoriano Lain 11/20/22 1542    Arby Barrette, MD 11/22/22 1723

## 2022-11-20 NOTE — Discharge Instructions (Signed)
As discussed, your lab tests today including 2 negative troponins, negative D-dimer, chest x-ray and EKGs are all reassuring today.  Call Dr. Diona Browner for an office follow-up.  In the interim return here if you have any return of your symptoms or new or worsened symptoms.

## 2022-11-22 ENCOUNTER — Encounter: Payer: PPO | Attending: Family Medicine | Admitting: Nutrition

## 2022-11-22 DIAGNOSIS — I10 Essential (primary) hypertension: Secondary | ICD-10-CM | POA: Insufficient documentation

## 2022-11-22 DIAGNOSIS — I5032 Chronic diastolic (congestive) heart failure: Secondary | ICD-10-CM

## 2022-11-22 DIAGNOSIS — R7303 Prediabetes: Secondary | ICD-10-CM

## 2022-11-22 NOTE — Progress Notes (Signed)
Medical Nutrition Therapy  Appointment Start time:  1000  Appointment End time:  1645  Primary concerns today: Pre DM, Obesity, HTN  Referral diagnosis: R73.03, E66.01 Preferred learning style: Hands on, auditory. Has vision issues.  Learning readiness: Ready   NUTRITION ASSESSMENT        72  yr old wfmale here for obesity, prediabetes and Hyperlipidemia Had lost 26 lbs from July to Jan. Was involved with the  PREP program through [facility] program. Feels better. Still wishes to make more lifestyle changes. 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 Is emotionally upset with the thought of losing some of her eye sight.  Has gotten out of the habit to planning her meals and that helped a lot in the past. Willing to work on that.  Gained 4 lbs since Feb 2024.  She is willing to work on Lifestyle Medicine to improve her health, lose weight and reverse her prediabetes.   Anthropometrics  Wt Readings from Last 3 Encounters:  11/20/22 260 lb (117.9 kg)  11/17/22 260 lb (117.9 kg)  10/13/22 265 lb 9.6 oz (120.5 kg)   Ht Readings from Last 3 Encounters:  11/20/22 5\' 4"  (1.626 m)  11/17/22 5\' 4"  (1.626 m)  10/13/22 5\' 4"  (1.626 m)   There is no height or weight on file to calculate BMI. @BMIFA @ Facility age limit for growth %iles is 20 years. Facility age limit for growth %iles is 20 years.    Clinical Medical Hx: See chart Medications: See chart Labs:     Latest Ref Rng & Units 11/20/2022   10:59 AM 08/31/2022    8:12 AM 07/21/2022   11:39 AM  CMP  Glucose 70 - 99 mg/dL 409  811  914   BUN 8 - 23 mg/dL 22  18  20    Creatinine 0.44 - 1.00 mg/dL 7.82  9.56  2.13   Sodium 135 - 145 mmol/L 137  140  138   Potassium 3.5 - 5.1 mmol/L 4.0  4.7  4.2   Chloride 98 - 111 mmol/L 101  100  100   CO2 22 - 32 mmol/L 26  25  29    Calcium 8.9 - 10.3 mg/dL 9.4   9.7   Total Protein 6.5 - 8.1 g/dL 7.4  7.4    Total Bilirubin 0.3 - 1.2 mg/dL  0.6  0.5    Alkaline Phos 38 - 126 U/L 96  126    AST 15 - 41 U/L 29  24    ALT 0 - 44 U/L 22  27     Lipid Panel     Component Value Date/Time   CHOL 132 08/31/2022 0812   TRIG 191 (H) 08/31/2022 0812   HDL 48 08/31/2022 0812   CHOLHDL 2.8 08/31/2022 0812   CHOLHDL 5.0 02/06/2020 0859   VLDL 46 (H) 02/06/2020 0859   LDLCALC 53 08/31/2022 0812   LABVLDL 31 08/31/2022 0812   Lab Results  Component Value Date   HGBA1C 6.1 (H) 08/31/2022    Notable Signs/Symptoms: Limited eye sight  Lifestyle & Dietary Hx LIves with her niece. Eats at home and Eats out some.  Estimated daily fluid intake: 64 oz Supplements: MVI,  Sleep: good Stress / self-care: a lot over her eye issues.  Current average weekly physical activity: ADL can't do a lot due to her eye issues.  24-Hr Dietary Recall Eats 3 meals per day   Estimated Energy Needs  Calories: 1200 Carbohydrate: 135g Protein: 90g Fat: 33g   NUTRITION DIAGNOSIS  NI-1.7 Predicted excessive energy intake As related to Obesity and prediabetes.  As evidenced by A1c 5.8% and BMI 45.   NUTRITION INTERVENTION  Nutrition education (E-1) on the following topics:  Nutrition and Diabetes education provided on My Plate, CHO counting, meal planning, portion sizes, timing of meals, avoiding snacks between meals unless having a low blood sugar, target ranges for A1C and blood sugars, signs/symptoms and treatment of hyper/hypoglycemia, monitoring blood sugars, taking medications as prescribed, benefits of exercising 30 minutes per day and prevention of complications of DM.  Lifestyle Medicine  - Whole Food, Plant Predominant Nutrition is highly recommended: Eat Plenty of vegetables, Mushrooms, fruits, Legumes, Whole Grains, Nuts, seeds in lieu of processed meats, processed snacks/pastries red meat, poultry, eggs.    -It is better to avoid simple carbohydrates including: Cakes, Sweet Desserts, Ice Cream, Soda (diet and regular), Sweet Tea,  Candies, Chips, Cookies, Store Bought Juices, Alcohol in Excess of  1-2 drinks a day, Lemonade,  Artificial Sweeteners, Doughnuts, Coffee Creamers, "Sugar-free" Products, etc, etc.  This is not a complete list.....  Exercise: If you are able: 30 -60 minutes a day ,4 days a week, or 150 minutes a week.  The longer the better.  Combine stretch, strength, and aerobic activities.  If you were told in the past that you have high risk for cardiovascular diseases, you may seek evaluation by your heart doctor prior to initiating moderate to intense exercise programs.    Handouts Provided Include  Lifestyle Medicine   Learning Style & Readiness for Change Teaching method utilized: Visual & Auditory  Demonstrated degree of understanding via: Teach Back  Barriers to learning/adherence to lifestyle change: none  Goals Established by Pt Goals  Get back to planning meals Stop thinking about going to Cookout Work on eating more foods from a garden Talk to [facility] about going to water aerobics   MONITORING & EVALUATION Dietary intake, weekly physical activity, and weight in 1 month..  Next Steps  Patient is to work on meal planning.[name]

## 2022-11-23 ENCOUNTER — Encounter: Payer: Self-pay | Admitting: Nutrition

## 2022-11-23 NOTE — Patient Instructions (Addendum)
Goals  Continue to increase fresh fruits, vegetables and whole grains. Try chair exercises or walking 15 minutes a day for exercise Keep trigger foods out of the house. Focus on high fiber whole plant based foods Avoid processed foods and sweets.

## 2022-11-25 DIAGNOSIS — H353211 Exudative age-related macular degeneration, right eye, with active choroidal neovascularization: Secondary | ICD-10-CM | POA: Diagnosis not present

## 2022-11-25 DIAGNOSIS — H35071 Retinal telangiectasis, right eye: Secondary | ICD-10-CM | POA: Diagnosis not present

## 2022-11-25 DIAGNOSIS — H35351 Cystoid macular degeneration, right eye: Secondary | ICD-10-CM | POA: Diagnosis not present

## 2022-11-25 DIAGNOSIS — H3321 Serous retinal detachment, right eye: Secondary | ICD-10-CM | POA: Diagnosis not present

## 2022-11-25 DIAGNOSIS — H43821 Vitreomacular adhesion, right eye: Secondary | ICD-10-CM | POA: Diagnosis not present

## 2022-11-25 DIAGNOSIS — H34232 Retinal artery branch occlusion, left eye: Secondary | ICD-10-CM | POA: Diagnosis not present

## 2022-11-25 DIAGNOSIS — H353132 Nonexudative age-related macular degeneration, bilateral, intermediate dry stage: Secondary | ICD-10-CM | POA: Diagnosis not present

## 2022-11-25 NOTE — Progress Notes (Signed)
Cardiology Office Note  Date: 11/26/2022   ID: [name], DOB 01-20-1951, MRN [id number]  History of Present Illness: [name] is a 72 y.o. female last seen in February and clinically stable at that time.  She presents back to the office after recent ER encounter for evaluation of chest discomfort.  I reviewed records, high-sensitivity troponin I levels were normal, D-dimer was normal, chest x-ray showed no acute findings, and ECG showed atrial paced rhythm.  Of note, she has a history of normal coronary arteries documented at cardiac catheterization in 2021.  She tells me that this largely started about 3 weeks ago when she was feeling significant fatigue in the afternoons after going to the [facility].  The symptoms resolved and then within the last few weeks she has had a recurring "heavy sinking" feeling sometimes in her chest or lower body.  This can occur while she is standing up, but sometimes when she is seated.  No description of vertigo, no palpitations or frank syncope.  Despite the symptoms she still goes to the [facility] and exercises 30 minutes cardio and 30 minutes strength without specific limitation.  Biotronik pacemaker in place with follow-up by Dr. Ladona Ridgel.  I reviewed the recent office note which indicated normal pacer function.  Orthostatic vital signs were not diagnostic, no significant drop in blood pressure or increase in heart rate.  Physical Exam: VS:  BP 138/83   Pulse 96   Ht 5\' 4"  (1.626 m)   Wt 269 lb 3.2 oz (122.1 kg)   SpO2 99%   BMI 46.21 kg/m , BMI Body mass index is 46.21 kg/m.  Wt Readings from Last 3 Encounters:  11/26/22 269 lb 3.2 oz (122.1 kg)  11/22/22 267 lb (121.1 kg)  11/20/22 260 lb (117.9 kg)    General: Patient appears comfortable at rest. HEENT: Conjunctiva and lids normal. Neck: Supple, no elevated JVP or carotid bruits. Lungs: Clear to auscultation, nonlabored breathing at rest. Cardiac: Regular rate and rhythm, no S3 or  significant systolic murmur. Extremities: No pitting edema.  ECG:  An ECG dated 11/20/2022 was personally reviewed today and demonstrated:  Atrial paced rhythm.  Labwork: 11/20/2022: ALT 22; AST 29; BUN 22; Creatinine, Ser 0.76; Hemoglobin 13.1; Platelets 206; Potassium 4.0; Sodium 137     Component Value Date/Time   CHOL 132 08/31/2022 0812   TRIG 191 (H) 08/31/2022 0812   HDL 48 08/31/2022 0812   CHOLHDL 2.8 08/31/2022 0812   CHOLHDL 5.0 02/06/2020 0859   VLDL 46 (H) 02/06/2020 0859   LDLCALC 53 08/31/2022 0812   Other Studies Reviewed Today:  Carotid Dopplers 09/17/2022: IMPRESSION: 1. Mild (1-49%) stenosis proximal right internal carotid artery secondary to heterogenous atherosclerotic plaque. 2. Mild (1-49%) stenosis proximal left internal carotid artery secondary to heterogenous atherosclerotic plaque. 3. Nonspecific elevation of the peak systolic velocities in the distal internal carotid arteries bilaterally. Findings may reflect nonvisualized atherosclerotic plaque, tortuosity, or less likely fibromuscular dysplasia. 4. Vertebral arteries are patent with normal antegrade flow.  Chest x-ray 11/20/2022: FINDINGS: Stable pacemaker. The heart, hila, and mediastinum are unchanged. No pneumothorax. No nodules or masses. No focal infiltrates.   IMPRESSION: No active disease.  Assessment and Plan:  1.  Episodes of a "heavy sinking" feeling as discussed above, no palpitations or frank syncope.  No consistent association with position change, can occur standing or seated position.  Orthostatics were negative today.  Recent ER visit reviewed with reassuring findings and no evidence of ACS.  Etiology of  symptoms not clear as yet.  It is reassuring that she had normal coronary arteries at cardiac catheterization in 2021.  Plan to begin workup with a 7-day ZIO mainly to make sure that there are no tachy or brady arrhythmias precipitating symptoms.  One would think this is not the case  however with recent pacemaker interrogation showing normal findings.  Will set up office follow-up for review, could always consider a Lexiscan Myoview as a next step, although obstructive CAD is not necessarily high in the differential at this time based on her prior workup.  2.  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  She follows with Dr. Ladona Ridgel.  Recent office visit reviewed with description of normal pacing function.  3.  Asymptomatic carotid artery disease, mild bilateral ICA stenosis by carotid Dopplers in March.  She is on aspirin and Crestor.  3.  Essential hypertension.  4.  Mixed hyperlipidemia.  LDL 53 in February on Crestor 10 mg daily.  Disposition:  Follow up  in the next few weeks with APP.  Signed, Jonelle Sidle, M.D., F.A.C.C. [facility]

## 2022-11-26 ENCOUNTER — Ambulatory Visit: Payer: PPO | Attending: Cardiology | Admitting: Cardiology

## 2022-11-26 ENCOUNTER — Ambulatory Visit: Payer: PPO | Attending: Cardiology

## 2022-11-26 ENCOUNTER — Other Ambulatory Visit: Payer: Self-pay | Admitting: Cardiology

## 2022-11-26 ENCOUNTER — Encounter: Payer: Self-pay | Admitting: Cardiology

## 2022-11-26 VITALS — BP 143/85 | HR 71 | Ht 64.0 in | Wt 269.2 lb

## 2022-11-26 DIAGNOSIS — R42 Dizziness and giddiness: Secondary | ICD-10-CM

## 2022-11-26 DIAGNOSIS — I10 Essential (primary) hypertension: Secondary | ICD-10-CM

## 2022-11-26 DIAGNOSIS — I6523 Occlusion and stenosis of bilateral carotid arteries: Secondary | ICD-10-CM

## 2022-11-26 DIAGNOSIS — I495 Sick sinus syndrome: Secondary | ICD-10-CM

## 2022-11-26 DIAGNOSIS — E782 Mixed hyperlipidemia: Secondary | ICD-10-CM

## 2022-11-26 NOTE — Patient Instructions (Addendum)
Medication Instructions:  Your physician recommends that you continue on your current medications as directed. Please refer to the Current Medication list given to you today.  Labwork: none  Testing/Procedures: Your physician has recommended that you wear a Zio monitor.   This monitor is a medical device that records the heart's electrical activity. Doctors most often use these monitors to diagnose arrhythmias. Arrhythmias are problems with the speed or rhythm of the heartbeat. The monitor is a small device applied to your chest. You can wear one while you do your normal daily activities. While wearing this monitor if you have any symptoms to push the button and record what you felt. Once you have worn this monitor for the period of time provider prescribed (for 7 days), you will return the monitor device in the postage paid box. Once it is returned they will download the data collected and provide [place] with a report which the provider will then review and we will call you with those results. Important tips:  Avoid showering during the first 24 hours of wearing the monitor. Avoid excessive sweating to help maximize wear time. Do not submerge the device, no hot tubs, and no swimming pools. Keep any lotions or oils away from the patch. After 24 hours you may shower with the patch on. Take brief showers with your back facing the shower head.  Do not remove patch once it has been placed because that will interrupt data and decrease adhesive wear time. Push the button when you have any symptoms and write down what you were feeling. Once you have completed wearing your monitor, remove and place into box which has postage paid and place in your outgoing mailbox.  If for some reason you have misplaced your box then call our office and we can provide another box and/or mail it off for you.  Follow-Up: Your physician recommends that you schedule a follow-up appointment in: 4 weeks  Any Other Special  Instructions Will Be Listed Below (If Applicable).  If you need a refill on your cardiac medications before your next appointment, please call your pharmacy.

## 2022-12-01 DIAGNOSIS — H26493 Other secondary cataract, bilateral: Secondary | ICD-10-CM | POA: Diagnosis not present

## 2022-12-01 DIAGNOSIS — H01021 Squamous blepharitis right upper eyelid: Secondary | ICD-10-CM | POA: Diagnosis not present

## 2022-12-01 DIAGNOSIS — H5711 Ocular pain, right eye: Secondary | ICD-10-CM | POA: Diagnosis not present

## 2022-12-01 DIAGNOSIS — Z961 Presence of intraocular lens: Secondary | ICD-10-CM | POA: Diagnosis not present

## 2022-12-01 DIAGNOSIS — H353131 Nonexudative age-related macular degeneration, bilateral, early dry stage: Secondary | ICD-10-CM | POA: Diagnosis not present

## 2022-12-01 DIAGNOSIS — H01024 Squamous blepharitis left upper eyelid: Secondary | ICD-10-CM | POA: Diagnosis not present

## 2022-12-01 DIAGNOSIS — H2000 Unspecified acute and subacute iridocyclitis: Secondary | ICD-10-CM | POA: Diagnosis not present

## 2022-12-01 DIAGNOSIS — H34232 Retinal artery branch occlusion, left eye: Secondary | ICD-10-CM | POA: Diagnosis not present

## 2022-12-02 DIAGNOSIS — F4323 Adjustment disorder with mixed anxiety and depressed mood: Secondary | ICD-10-CM | POA: Diagnosis not present

## 2022-12-07 ENCOUNTER — Encounter: Payer: Self-pay | Admitting: *Deleted

## 2022-12-08 ENCOUNTER — Ambulatory Visit (INDEPENDENT_AMBULATORY_CARE_PROVIDER_SITE_OTHER): Payer: PPO | Admitting: Family Medicine

## 2022-12-08 ENCOUNTER — Encounter (INDEPENDENT_AMBULATORY_CARE_PROVIDER_SITE_OTHER): Payer: Self-pay | Admitting: Family Medicine

## 2022-12-08 VITALS — BP 130/79 | HR 64 | Temp 97.5°F | Ht 64.0 in | Wt 262.0 lb

## 2022-12-08 DIAGNOSIS — R5383 Other fatigue: Secondary | ICD-10-CM | POA: Diagnosis not present

## 2022-12-08 DIAGNOSIS — Z6841 Body Mass Index (BMI) 40.0 and over, adult: Secondary | ICD-10-CM

## 2022-12-08 DIAGNOSIS — E559 Vitamin D deficiency, unspecified: Secondary | ICD-10-CM | POA: Diagnosis not present

## 2022-12-08 DIAGNOSIS — E782 Mixed hyperlipidemia: Secondary | ICD-10-CM

## 2022-12-08 DIAGNOSIS — F3175 Bipolar disorder, in partial remission, most recent episode depressed: Secondary | ICD-10-CM | POA: Diagnosis not present

## 2022-12-08 DIAGNOSIS — R0602 Shortness of breath: Secondary | ICD-10-CM | POA: Diagnosis not present

## 2022-12-08 DIAGNOSIS — R739 Hyperglycemia, unspecified: Secondary | ICD-10-CM | POA: Diagnosis not present

## 2022-12-08 DIAGNOSIS — Z1331 Encounter for screening for depression: Secondary | ICD-10-CM

## 2022-12-08 DIAGNOSIS — G4733 Obstructive sleep apnea (adult) (pediatric): Secondary | ICD-10-CM

## 2022-12-08 NOTE — Progress Notes (Signed)
Chief Complaint:   OBESITY [name] (MR# [id number]) is a 72 y.o. female who presents for evaluation and treatment of obesity and related comorbidities. Current BMI is Body mass index is 44.97 kg/m. [name] has been struggling with her weight for many years and has been unsuccessful in either losing weight, maintaining weight loss, or reaching her healthy weight goal.  Referred by [name].  Previously did PREP program thru [employer].  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  Lives at home with niece.  Niece is a vegan so she will not eat what patient eats.  Brings husband home every 3 weeks from group home he lives in for a weekend. Desired weight is 165-175lb- last time she was that weight was in her 40s.  Started psych meds and had dramatic weight gain at that time.  Previously did Weight Watchers and lost 40lbs and enjoyed this but recognizes this was not long term for her. Patient eats out 3-4 times a week; cookout, [place] food, [employer], [employer].   Food recall: oatmeal, banana, coffee with milk (1/2 cup uncooked rolled oats) (feels full).  Eats again 11:30am/12pm and will be cup of bean salad (1-2 cups), vegetables, bread (feels satisfied) or [employer] Cheeseburger, fries and milkshake (eat and drink all and feel satisfied).  5:30/6pm will eat again spaghetti and vegetable and bread (several cups of spaghetti).  Will snack after dinner- dry cereal (corn flakes or cheerios) or sweets in the house (cookies).  [name] is currently in the action stage of change and ready to dedicate time achieving and maintaining a healthier weight. [name] is interested in becoming our patient and working on intensive lifestyle modifications including (but not limited to) diet and exercise for weight loss.  [name] habits were reviewed today and are as follows: Her family eats meals together, she thinks her family will eat healthier with her, she  struggles with family and or coworkers weight loss sabotage, her desired weight loss is 97 lbs, she has been heavy most of her life, she started gaining weight at the age of 47 and using antidepressant medications, her heaviest weight ever was 280 pounds, she has significant food cravings issues, she snacks frequently in the evenings, she is frequently drinking liquids with calories, she frequently makes poor food choices, she frequently eats larger portions than normal, and she struggles with emotional eating.  Depression Screen [name] Food and Mood (modified PHQ-9) score was 17.   Subjective:   1. Other fatigue [name] denies daytime somnolence and denies waking up still tired. Patient has a history of symptoms of n/a. [name] generally gets 8 hours of sleep per night, and states that she has generally restful sleep. Snoring is present. Apneic episodes are not present. Epworth Sleepiness Score is 1.  EKG done on 11/23/2022, dependent on pacemaker NSR.  2. SOBOE (shortness of breath on exertion) [name] notes increasing shortness of breath with exercising and seems to be worsening over time with weight gain. She notes getting out of breath sooner with activity than she used to. This has not gotten worse recently. [name] denies shortness of breath at rest or orthopnea.  3. Hyperglycemia Patient has a history of prediabetes.  Last A1c was 6.1.  Patient reports never being on medications.  4. Mixed hyperlipidemia Patient is taking rosuvastatin 10 mg daily.  Patient is not on anything for increased triglycerides.  5. Bipolar disorder, in partial remission, most recent episode depressed [name]) Patient is seen Dr.  Farrel Gordon, she is on Seroquel, Lamictal , Xanax x 20 years.  Patient feels stable.  6. Vitamin D deficiency Patient is taking 50,000 IU of vitamin D weekly.  Last vitamin D level in 2020 was 52.  7. OSA (obstructive sleep apnea) Patient has a CPAP x 2 years and uses it nightly.   Patient have a sleep study from 2021.  Assessment/Plan:   1. Other fatigue [name] does feel that her weight is causing her energy to be lower than it should be. Fatigue may be related to obesity, depression or many other causes. Labs will be ordered, and in the meanwhile, [name] will focus on self care including making healthy food choices, increasing physical activity and focusing on stress reduction.  Check IC, labs today.  - TSH - T4, free - T3  2. SOBOE (shortness of breath on exertion) [name] does feel that she gets out of breath more easily that she used to when she exercises. [name] shortness of breath appears to be obesity related and exercise induced. She has agreed to work on weight loss and gradually increase exercise to treat her exercise induced shortness of breath. Will continue to monitor closely.  3. Hyperglycemia Check labs today.  - Hemoglobin A1c - Insulin, random  4. Mixed hyperlipidemia Check labs today.  - Lipid Panel With LDL/HDL Ratio  5. Bipolar disorder, in partial remission, most recent episode depressed ([name]) Follow-up with psych.  6. Vitamin D deficiency Check labs today.  - VITAMIN D 25 Hydroxy (Vit-D Deficiency, Fractures)  7. OSA (obstructive sleep apnea) Follow-up on compliance at next appointment.  8. Depression screening [name] had a positive depression screening. Depression is commonly associated with obesity and often results in emotional eating behaviors. We will monitor this closely and work on CBT to help improve the non-hunger eating patterns. Referral to Psychology may be required if no improvement is seen as she continues in our clinic.  9. Class 3 severe obesity with serious comorbidity and body mass index (BMI) of 45.0 to 49.9 in adult, unspecified obesity type [name]) [name] is currently in the action stage of change and her goal is to continue with weight loss efforts. I recommend [name] begin the structured treatment plan  as follows:  She has agreed to the Category 2 Plan.  Exercise goals: No exercise has been prescribed at this time.   Behavioral modification strategies: increasing lean protein intake, meal planning and cooking strategies, keeping healthy foods in the home, and planning for success.  She was informed of the importance of frequent follow-up visits to maximize her success with intensive lifestyle modifications for her multiple health conditions. She was informed we would discuss her lab results at her next visit unless there is a critical issue that needs to be addressed sooner. [name] agreed to keep her next visit at the agreed upon time to discuss these results.  Objective:   Blood pressure 130/79, pulse 64, temperature (!) 97.5 F (36.4 C), height 5\' 4"  (1.626 m), weight 262 lb (118.8 kg), SpO2 97 %. Body mass index is 44.97 kg/m.  EKG: Normal sinus rhythm, rate 62 bpm.  Indirect Calorimeter completed today shows a VO2 of 215 and a REE of 1483.  Her calculated basal metabolic rate is 1610 thus her basal metabolic rate is worse than expected.  General: Cooperative, alert, well developed, in no acute distress. HEENT: Conjunctivae and lids unremarkable. Cardiovascular: Regular rhythm.  Lungs: Normal work of breathing. Neurologic: No focal deficits.   Lab Results  Component Value Date   CREATININE 0.76 11/20/2022   BUN 22 11/20/2022   NA 137 11/20/2022   K 4.0 11/20/2022   CL 101 11/20/2022   CO2 26 11/20/2022   Lab Results  Component Value Date   ALT 22 11/20/2022   AST 29 11/20/2022   ALKPHOS 96 11/20/2022   BILITOT 0.6 11/20/2022   Lab Results  Component Value Date   HGBA1C 6.1 (H) 12/08/2022   HGBA1C 6.1 (H) 08/31/2022   HGBA1C 5.9 (H) 02/08/2022   HGBA1C 6.0 (H) 08/28/2021   HGBA1C 5.9 (H) 02/05/2021   Lab Results  Component Value Date   INSULIN 9.0 12/08/2022   Lab Results  Component Value Date   TSH 1.900 12/08/2022   Lab Results  Component Value Date    CHOL 111 12/08/2022   HDL 44 12/08/2022   LDLCALC 41 12/08/2022   TRIG 155 (H) 12/08/2022   CHOLHDL 2.8 08/31/2022   Lab Results  Component Value Date   WBC 5.7 11/20/2022   HGB 13.1 11/20/2022   HCT 40.9 11/20/2022   MCV 98.8 11/20/2022   PLT 206 11/20/2022   No results found for: "IRON", "TIBC", "FERRITIN"  Attestation Statements:   Reviewed by clinician on day of visit: allergies, medications, problem list, medical history, surgical history, family history, social history, and previous encounter notes.  I, Malcolm Metro, RMA, am acting as transcriptionist for Reuben Likes, MD.  This is the patient's first visit at [facility]. The patient's NEW PATIENT PACKET was reviewed at length. Included in the packet: current and past health history, medications, allergies, ROS, gynecologic history (women only), surgical history, family history, social history, weight history, weight loss surgery history (for those that have had weight loss surgery), nutritional evaluation, mood and food questionnaire, PHQ9, Epworth questionnaire, sleep habits questionnaire, patient life and health improvement goals questionnaire. These will all be scanned into the patient's chart under media.   During the visit, I independently reviewed the patient's EKG, bioimpedance scale results, and indirect calorimeter results. I used this information to tailor a meal plan for the patient that will help her to lose weight and will improve her obesity-related conditions going forward. I performed a medically necessary appropriate examination and/or evaluation. I discussed the assessment and treatment plan with the patient. The patient was provided an opportunity to ask questions and all were answered. The patient agreed with the plan and demonstrated an understanding of the instructions. Labs were ordered at this visit and will be reviewed at the next visit unless more critical results need to be addressed  immediately. Clinical information was updated and documented in the EMR.    I have reviewed the above documentation for accuracy and completeness, and I agree with the above. - Reuben Likes, MD

## 2022-12-09 DIAGNOSIS — G4733 Obstructive sleep apnea (adult) (pediatric): Secondary | ICD-10-CM | POA: Diagnosis not present

## 2022-12-09 DIAGNOSIS — R42 Dizziness and giddiness: Secondary | ICD-10-CM | POA: Diagnosis not present

## 2022-12-09 LAB — T4, FREE: Free T4: 1.16 ng/dL (ref 0.82–1.77)

## 2022-12-09 LAB — T3: T3, Total: 96 ng/dL (ref 71–180)

## 2022-12-09 LAB — LIPID PANEL WITH LDL/HDL RATIO
Cholesterol, Total: 111 mg/dL (ref 100–199)
HDL: 44 mg/dL (ref >39)
LDL Chol Calc (NIH): 41 mg/dL (ref 0–99)
LDL/HDL Ratio: 0.9 ratio (ref 0.0–3.2)
Triglycerides: 155 mg/dL — ABNORMAL HIGH (ref 0–149)
VLDL Cholesterol Cal: 26 mg/dL (ref 5–40)

## 2022-12-09 LAB — TSH: TSH: 1.9 u[IU]/mL (ref 0.450–4.500)

## 2022-12-09 LAB — VITAMIN D 25 HYDROXY (VIT D DEFICIENCY, FRACTURES): Vit D, 25-Hydroxy: 80.8 ng/mL (ref 30.0–100.0)

## 2022-12-09 LAB — HEMOGLOBIN A1C
Est. average glucose Bld gHb Est-mCnc: 128 mg/dL
Hgb A1c MFr Bld: 6.1 % — ABNORMAL HIGH (ref 4.8–5.6)

## 2022-12-09 LAB — INSULIN, RANDOM: INSULIN: 9 u[IU]/mL (ref 2.6–24.9)

## 2022-12-14 ENCOUNTER — Encounter (INDEPENDENT_AMBULATORY_CARE_PROVIDER_SITE_OTHER): Payer: Self-pay | Admitting: Family Medicine

## 2022-12-16 DIAGNOSIS — H35351 Cystoid macular degeneration, right eye: Secondary | ICD-10-CM | POA: Diagnosis not present

## 2022-12-16 DIAGNOSIS — H3321 Serous retinal detachment, right eye: Secondary | ICD-10-CM | POA: Diagnosis not present

## 2022-12-16 DIAGNOSIS — H43821 Vitreomacular adhesion, right eye: Secondary | ICD-10-CM | POA: Diagnosis not present

## 2022-12-16 DIAGNOSIS — H353132 Nonexudative age-related macular degeneration, bilateral, intermediate dry stage: Secondary | ICD-10-CM | POA: Diagnosis not present

## 2022-12-16 DIAGNOSIS — H353211 Exudative age-related macular degeneration, right eye, with active choroidal neovascularization: Secondary | ICD-10-CM | POA: Diagnosis not present

## 2022-12-16 DIAGNOSIS — H35071 Retinal telangiectasis, right eye: Secondary | ICD-10-CM | POA: Diagnosis not present

## 2022-12-16 DIAGNOSIS — H34232 Retinal artery branch occlusion, left eye: Secondary | ICD-10-CM | POA: Diagnosis not present

## 2022-12-23 ENCOUNTER — Encounter (INDEPENDENT_AMBULATORY_CARE_PROVIDER_SITE_OTHER): Payer: Self-pay | Admitting: Family Medicine

## 2022-12-23 ENCOUNTER — Ambulatory Visit (INDEPENDENT_AMBULATORY_CARE_PROVIDER_SITE_OTHER): Payer: PPO | Admitting: Family Medicine

## 2022-12-23 VITALS — BP 131/83 | HR 68 | Temp 98.2°F | Ht 64.0 in | Wt 261.0 lb

## 2022-12-23 DIAGNOSIS — E7849 Other hyperlipidemia: Secondary | ICD-10-CM

## 2022-12-23 DIAGNOSIS — R7303 Prediabetes: Secondary | ICD-10-CM | POA: Diagnosis not present

## 2022-12-23 DIAGNOSIS — Z6841 Body Mass Index (BMI) 40.0 and over, adult: Secondary | ICD-10-CM

## 2022-12-23 DIAGNOSIS — E669 Obesity, unspecified: Secondary | ICD-10-CM | POA: Diagnosis not present

## 2022-12-23 DIAGNOSIS — E559 Vitamin D deficiency, unspecified: Secondary | ICD-10-CM

## 2022-12-23 MED ORDER — METFORMIN HCL 500 MG PO TABS
500.0000 mg | ORAL_TABLET | Freq: Every day | ORAL | 0 refills | Status: DC
Start: 2022-12-23 — End: 2023-01-06

## 2022-12-23 NOTE — Progress Notes (Unsigned)
Chief Complaint:   OBESITY [name] is here to discuss her progress with her obesity treatment plan along with follow-up of her obesity related diagnoses. [name] is on the Category 2 Plan and states she is following her eating plan approximately (unknown)% of the time. [name]  Today's visit was #: 2 Starting weight: 262 lbs Starting date: 12/08/2022 Today's weight: 261 lbs Today's date: 12/23/2022 Total lbs lost to date: 1 Total lbs lost since last in-office visit: 1  Interim History: Patient had a bit of a disconnect in terms of meal plan and following it. She mentions she doesn't remember getting the meal plan and she changed the snacks she was eating and has tried to increase protein over the last few weeks.  She likely won't eat the prepackaged meals because she did that previously for years when she was working. Next few weeks she doesn't have any events, activities or travel. She will likely get together with her family for 4th of July.   Subjective:   1. Vitamin D deficiency Patient is on prescription Vitamin D. She denies nausea, vomiting, or muscle weakness but notes fatigue.   2. Other hyperlipidemia Patient's LDL was 41, HDL 44, and triglycerides 604. She is on Crestor 10 mg daily.   3. Prediabetes Patient's last A1c was 6.1 and insulin 9.0. She is not on medications for pre-diabetes but she is on numerous weight promoting medications for psych.   Assessment/Plan:   1. Vitamin D deficiency Patient is to stop prescription Vitamin D, and start daily OTC Vitamin D 2,000 IU.  2. Other hyperlipidemia Patient will continue Crestor with no change in dose.   3. Prediabetes Patient agreed to start metformin 500 mg daily with no refills. Pathophysiology of insulin resistance, pre-diabetes, and diabetes mellitus were discussed today.   - metFORMIN (GLUCOPHAGE) 500 MG tablet; Take 1 tablet (500  mg total) by mouth daily with breakfast.  Dispense: 30 tablet; Refill: 0  4. BMI 40.0-44.9, adult (HCC)  5. Obesity with starting BMI of 45.0 [name] is currently in the action stage of change. As such, her goal is to continue with weight loss efforts. She has agreed to the Category 2 Plan.   Exercise goals: As is.   Behavioral modification strategies: increasing lean protein intake, meal planning and cooking strategies, keeping healthy foods in the home, and planning for success.  [name] has agreed to follow-up with our clinic in 2 to 3 weeks. She was informed of the importance of frequent follow-up visits to maximize her success with intensive lifestyle modifications for her multiple health conditions.   Objective:   Blood pressure 131/83, pulse 68, temperature 98.2 F (36.8 C), height 5\' 4"  (1.626 m), weight 261 lb (118.4 kg), SpO2 94 %. Body mass index is 44.8 kg/m.  General: Cooperative, alert, well developed, in no acute distress. HEENT: Conjunctivae and lids unremarkable. Cardiovascular: Regular rhythm.  Lungs: Normal work of breathing. Neurologic: No focal deficits.   Lab Results  Component Value Date   CREATININE 0.76 11/20/2022   BUN 22 11/20/2022   NA 137 11/20/2022   K 4.0 11/20/2022   CL 101 11/20/2022   CO2 26 11/20/2022   Lab Results  Component Value Date   ALT 22 11/20/2022   AST 29 11/20/2022   ALKPHOS 96 11/20/2022   BILITOT 0.6 11/20/2022   Lab Results  Component Value Date   HGBA1C  6.1 (H) 12/08/2022   HGBA1C 6.1 (H) 08/31/2022   HGBA1C 5.9 (H) 02/08/2022   HGBA1C 6.0 (H) 08/28/2021   HGBA1C 5.9 (H) 02/05/2021   Lab Results  Component Value Date   INSULIN 9.0 12/08/2022   Lab Results  Component Value Date   TSH 1.900 12/08/2022   Lab Results  Component Value Date   CHOL 111 12/08/2022   HDL 44 12/08/2022   LDLCALC 41 12/08/2022   TRIG 155 (H) 12/08/2022   CHOLHDL 2.8 08/31/2022   Lab Results  Component Value Date   VD25OH 80.8  12/08/2022   VD25OH 52.0 12/21/2018   VD25OH 32 08/22/2014   Lab Results  Component Value Date   WBC 5.7 11/20/2022   HGB 13.1 11/20/2022   HCT 40.9 11/20/2022   MCV 98.8 11/20/2022   PLT 206 11/20/2022   No results found for: "IRON", "TIBC", "FERRITIN"  Attestation Statements:   Reviewed by clinician on day of visit: allergies, medications, problem list, medical history, surgical history, family history, social history, and previous encounter notes.  Time spent on visit including pre-visit chart review and post-visit care and charting was 45 minutes.   I, Burt Knack, am acting as transcriptionist for Reuben Likes, MD.  I have reviewed the above documentation for accuracy and completeness, and I agree with the above. - Reuben Likes, MD

## 2022-12-28 DIAGNOSIS — H35071 Retinal telangiectasis, right eye: Secondary | ICD-10-CM | POA: Diagnosis not present

## 2022-12-28 DIAGNOSIS — H353211 Exudative age-related macular degeneration, right eye, with active choroidal neovascularization: Secondary | ICD-10-CM | POA: Diagnosis not present

## 2022-12-28 DIAGNOSIS — H35351 Cystoid macular degeneration, right eye: Secondary | ICD-10-CM | POA: Diagnosis not present

## 2022-12-28 DIAGNOSIS — H3321 Serous retinal detachment, right eye: Secondary | ICD-10-CM | POA: Diagnosis not present

## 2022-12-28 DIAGNOSIS — H353132 Nonexudative age-related macular degeneration, bilateral, intermediate dry stage: Secondary | ICD-10-CM | POA: Diagnosis not present

## 2022-12-28 DIAGNOSIS — H34232 Retinal artery branch occlusion, left eye: Secondary | ICD-10-CM | POA: Diagnosis not present

## 2022-12-28 DIAGNOSIS — H43821 Vitreomacular adhesion, right eye: Secondary | ICD-10-CM | POA: Diagnosis not present

## 2022-12-28 LAB — HM DIABETES EYE EXAM

## 2022-12-30 ENCOUNTER — Ambulatory Visit (INDEPENDENT_AMBULATORY_CARE_PROVIDER_SITE_OTHER): Payer: PPO

## 2022-12-30 ENCOUNTER — Encounter: Payer: Self-pay | Admitting: Nurse Practitioner

## 2022-12-30 ENCOUNTER — Ambulatory Visit: Payer: PPO | Attending: Nurse Practitioner | Admitting: Nurse Practitioner

## 2022-12-30 VITALS — BP 114/78 | HR 70 | Ht 64.0 in | Wt 266.4 lb

## 2022-12-30 DIAGNOSIS — Z950 Presence of cardiac pacemaker: Secondary | ICD-10-CM | POA: Diagnosis not present

## 2022-12-30 DIAGNOSIS — I6523 Occlusion and stenosis of bilateral carotid arteries: Secondary | ICD-10-CM | POA: Diagnosis not present

## 2022-12-30 DIAGNOSIS — I495 Sick sinus syndrome: Secondary | ICD-10-CM | POA: Diagnosis not present

## 2022-12-30 DIAGNOSIS — E782 Mixed hyperlipidemia: Secondary | ICD-10-CM | POA: Diagnosis not present

## 2022-12-30 DIAGNOSIS — I471 Supraventricular tachycardia, unspecified: Secondary | ICD-10-CM

## 2022-12-30 DIAGNOSIS — R42 Dizziness and giddiness: Secondary | ICD-10-CM | POA: Diagnosis not present

## 2022-12-30 DIAGNOSIS — I10 Essential (primary) hypertension: Secondary | ICD-10-CM

## 2022-12-30 NOTE — Patient Instructions (Addendum)
Medication Instructions:  Your physician recommends that you continue on your current medications as directed. Please refer to the Current Medication list given to you today.  Labwork: none  Testing/Procedures: none  Follow-Up: Your physician recommends that you schedule a follow-up appointment in: 3-4 months with Diona Browner in [place]  Any Other Special Instructions Will Be Listed Below (If Applicable).  If you need a refill on your cardiac medications before your next appointment, please call your pharmacy.

## 2022-12-30 NOTE — Progress Notes (Signed)
Office Visit    Patient Name: [name] Date of Encounter: 12/30/2022  PCP:  Babs Sciara, MD   [facility] Cardiologist:  Nona Dell, MD  Advanced Practice Provider:  No care team member to display Electrophysiologist:  Lewayne Bunting, MD   Chief Complaint    [name] is a 72 y.o. female with a hx of sinus node dysfunction, s/p PPM, hypertension, carotid artery disease, mixed hyperlipidemia, history of chest pain, sleep apnea, who presents today for 4 week follow-up.    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 Ladona Ridgel   Sleep apnea    SOB (shortness of breath)    Past Surgical History:  Procedure Laterality Date   ABDOMINAL HYSTERECTOMY     APPENDECTOMY     BREAST BIOPSY Left 2011   BREAST BIOPSY  2008   BREAST LUMPECTOMY Left 2008   CATARACT EXTRACTION     CHOLECYSTECTOMY     COLONOSCOPY N/A 09/13/2013   Procedure: COLONOSCOPY;  Surgeon: Malissa Hippo, MD;  Location: [facility];  Service: Endoscopy;  Laterality: N/A;  730   COLONOSCOPY WITH PROPOFOL N/A 07/23/2022   Procedure: COLONOSCOPY WITH PROPOFOL;  Surgeon: Lanelle Bal, DO;  Location: [facility];  Service: Endoscopy;  Laterality: N/A;  11:30 am    EP IMPLANTABLE DEVICE N/A 01/06/2016   Procedure: Pacemaker Implant;  Surgeon: Marinus Maw, MD;  Location: [facility] INVASIVE CV LAB;  Service: Cardiovascular;  Laterality: N/A;   ESOPHAGOGASTRODUODENOSCOPY N/A 01/17/2014   Procedure: ESOPHAGOGASTRODUODENOSCOPY (EGD);  Surgeon: Malissa Hippo, MD;  Location: [facility];  Service: Endoscopy;  Laterality: N/A;  245   POLYPECTOMY  07/23/2022   Procedure: POLYPECTOMY;  Surgeon: Lanelle Bal, DO;  Location: [facility];  Service: Endoscopy;;   RIGHT/LEFT HEART CATH AND CORONARY ANGIOGRAPHY N/A 01/01/2020   Procedure: RIGHT/LEFT HEART CATH AND CORONARY ANGIOGRAPHY;  Surgeon: Kathleene Hazel, MD;  Location: [facility] INVASIVE CV LAB;  Service: Cardiovascular;  Laterality: N/A;   TOTAL KNEE ARTHROPLASTY Bilateral right knee   2012, 2007    Allergies  Allergies  Allergen Reactions   Imipramine Rash   Tricor [Fenofibrate] Other (See Comments)    Pain, "aching"    History of Present Illness    [name] is a 72 y.o. female with a PMH as mentioned above.  Cardiac catheterization in 2021 revealed normal coronary arteries.  ED visit May 2024 for evaluation of chest discomfort.  Workup overall unremarkable.  Last seen by Dr. Diona Browner on Nov 26, 2022.  She noted significant fatigue and  afternoons after going to the [facility] this started about 3 weeks prior to office visit.  She described a "heavy sinking" feeling in her chest/lower body.  Noted with standing up, also noted when seated.  Denied any syncope, palpitations, vertigo.  Was noted to be very active at the [facility].  Negative orthostatics in office.  Monitor arranged and revealed episodes of atrial/junctional rhythm, PSVT that could be corresponding with symptoms, no pauses or unusual bradycardia noted.  Dr. Diona Browner recommended starting Toprol-XL 25 mg daily.  Today she presents for follow-up.  She states she continues to note a sinking feeling in chest/lower body, difficult to  describe, says she does have intermittent dizziness. Could not tolerate Metoprolol and had to stop as this caused her BP to drop significantly per her report. Denies any chest pain, shortness of breath, syncope, presyncope, orthopnea, PND, swelling or significant weight changes, acute bleeding, or claudication.  EKGs/Labs/Other Studies Reviewed:   The following studies were reviewed today:   EKG:  EKG is not ordered today.     Monitor 12/2022:  Predominant rhythm is sinus with heart rate ranging from 61 bpm up to 122 bpm and average heart rate 71 bpm. There does appear to be intermittent atrial and ventricular pacing. There were rare PACs and PVCs representing less than 1% total beats. Episodes of an apparent atrial rhythm or accelerated junctional rhythm as well as PSVT were noted, heart rates ranging from 90 bpm up to 110 bpm.  Faster heart rates in the 120s were mainly associated with a wide QRS and potentially tracking by her ventricular pacemaker. There were no pauses or heart block.  Recent Labs: 11/20/2022: ALT 22; BUN 22; Creatinine, Ser 0.76; Hemoglobin 13.1; Platelets 206; Potassium 4.0; Sodium 137 12/08/2022: TSH 1.900  Recent Lipid Panel    Component Value Date/Time   CHOL 111 12/08/2022 0946   TRIG 155 (H) 12/08/2022 0946   HDL 44 12/08/2022 0946   CHOLHDL 2.8 08/31/2022 0812   CHOLHDL 5.0 02/06/2020 0859   VLDL 46 (H) 02/06/2020 0859   LDLCALC 41 12/08/2022 0946    Home Medications   Current Meds  Medication Sig   acetaminophen (TYLENOL) 650 MG CR tablet Take 1,300 mg by mouth 2 (two) times daily.   ALPRAZolam (XANAX XR) 1 MG 24 hr tablet Take 1 mg by mouth every morning.   ALPRAZolam (XANAX) 0.5 MG tablet Take 0.5 mg by mouth daily as needed for anxiety.   aspirin EC 81 MG tablet Take 1 tablet (81 mg total) by mouth daily. Swallow whole.   bevacizumab (AVASTIN) 1.25 mg/0.1 mL SOLN 1.25 mg by Intravitreal route to Surgery. Every 6 weeks.   Cholecalciferol (VITAMIN  D3) 1.25 MG (50000 UT) CAPS Take 50,000 Units by mouth every Sunday. Sunday   glucosamine-chondroitin 500-400 MG tablet Take 2 tablets by mouth every morning.    lamoTRIgine (LAMICTAL) 200 MG tablet Take 400 mg by mouth at bedtime.   Lutein 20 MG CAPS Take 20 mg by mouth every morning.   metFORMIN (GLUCOPHAGE) 500 MG tablet Take 1 tablet (500 mg total) by mouth daily with breakfast.   Multiple Vitamin (MULTIVITAMIN) tablet Take 1 tablet by mouth daily. Senior   QUEtiapine (SEROQUEL) 300 MG tablet Take 600 mg by mouth at bedtime.   rosuvastatin (CRESTOR) 10 MG tablet TAKE 1 TABLET(10 MG) BY MOUTH DAILY (Patient taking differently: Take 10 mg by mouth in the morning.)     Review of Systems    All other systems reviewed and  are otherwise negative except as noted above.  Physical Exam    VS:  BP 114/78   Pulse 70   Ht 5\' 4"  (1.626 m)   Wt 266 lb 6.4 oz (120.8 kg)   SpO2 93%   BMI 45.73 kg/m  , BMI Body mass index is 45.73 kg/m.  Wt Readings from Last 3 Encounters:  12/30/22 266 lb 6.4 oz (120.8 kg)  12/23/22 261 lb (118.4 kg)  12/08/22 262 lb (118.8 kg)     GEN: Morbidly obese, 72 y.o. female in no acute distress. HEENT: normal. Neck: Supple, no JVD, carotid bruits, or masses. Cardiac: S1/S2, RRR, no murmurs, rubs, or gallops. No clubbing, cyanosis, edema.  Radials/PT 2+ and equal bilaterally.  Respiratory:  Respirations regular and unlabored, clear to auscultation bilaterally. GI: Soft, nontender, nondistended. MS: No deformity or atrophy. Skin: Warm and dry, no rash. Neuro:  Strength and sensation are intact. Psych: Normal affect.  Assessment & Plan    Dizziness Continues to note episodes of "heavy sinking feeling," past ED visit reassuring, no evidence of ACS, negative orthostatics at last office visit.  7-day ZIO monitor results noted above.  Will refer back to EP for evaluation noted below as appears she may be symptomatic with episodes of PSVT and atrial/junctional  rhythm, could not tolerate Metoprolol.  Discussed Lexiscan Myoview that was Dr. Ival Bible previous recommendation with patient, but due to the fact she is not having any active chest pain or recent chest pain, will defer at this time per her request.  Discussed conservative measures for dizziness, she verbalizes understanding, encouraged adequate hydration.  2. Sinus node dysfunction, s/p PPM, PSVT Based on recent episodes of PSVT and atrial/junctional rhythm seen on recent monitor.  Could not tolerate metoprolol.  Normal device function seen on remote device check 09/2022.  Will refer patient back to EP for further evaluation.   3.  Hypertension Blood pressure stable. Discussed to monitor BP at home at least 2 hours after medications and sitting for 5-10 minutes.  No medication changes at this time. Heart healthy diet and regular cardiovascular exercise encouraged.   4. Carotid artery disease, mixed hyperlipidemia Mild bilateral ICA stenosis seen March 2024.  Asymptomatic.  LDL 53 08/2022.  Continue aspirin and rosuvastatin. Heart healthy diet and regular cardiovascular exercise encouraged.    Disposition: Follow up in 3-4 month(s) with Nona Dell, MD or APP.  Signed, Sharlene Dory, NP 12/31/2022, 12:41 PM [facility]

## 2023-01-03 DIAGNOSIS — H01021 Squamous blepharitis right upper eyelid: Secondary | ICD-10-CM | POA: Diagnosis not present

## 2023-01-03 DIAGNOSIS — H353131 Nonexudative age-related macular degeneration, bilateral, early dry stage: Secondary | ICD-10-CM | POA: Diagnosis not present

## 2023-01-03 DIAGNOSIS — Z961 Presence of intraocular lens: Secondary | ICD-10-CM | POA: Diagnosis not present

## 2023-01-03 DIAGNOSIS — H34232 Retinal artery branch occlusion, left eye: Secondary | ICD-10-CM | POA: Diagnosis not present

## 2023-01-03 DIAGNOSIS — H01024 Squamous blepharitis left upper eyelid: Secondary | ICD-10-CM | POA: Diagnosis not present

## 2023-01-05 ENCOUNTER — Encounter: Payer: Self-pay | Admitting: *Deleted

## 2023-01-06 ENCOUNTER — Encounter (INDEPENDENT_AMBULATORY_CARE_PROVIDER_SITE_OTHER): Payer: Self-pay | Admitting: Internal Medicine

## 2023-01-06 ENCOUNTER — Ambulatory Visit (INDEPENDENT_AMBULATORY_CARE_PROVIDER_SITE_OTHER): Payer: PPO | Admitting: Internal Medicine

## 2023-01-06 VITALS — BP 151/80 | HR 74 | Temp 97.6°F | Ht 64.0 in | Wt 261.0 lb

## 2023-01-06 DIAGNOSIS — R030 Elevated blood-pressure reading, without diagnosis of hypertension: Secondary | ICD-10-CM | POA: Diagnosis not present

## 2023-01-06 DIAGNOSIS — Z6841 Body Mass Index (BMI) 40.0 and over, adult: Secondary | ICD-10-CM

## 2023-01-06 DIAGNOSIS — R7303 Prediabetes: Secondary | ICD-10-CM | POA: Diagnosis not present

## 2023-01-06 DIAGNOSIS — E669 Obesity, unspecified: Secondary | ICD-10-CM | POA: Diagnosis not present

## 2023-01-06 MED ORDER — METFORMIN HCL ER 500 MG PO TB24
500.0000 mg | ORAL_TABLET | Freq: Every day | ORAL | 0 refills | Status: DC
Start: 2023-01-06 — End: 2023-02-08

## 2023-01-06 NOTE — Assessment & Plan Note (Signed)
Most recent A1c is  Lab Results  Component Value Date   HGBA1C 6.1 (H) 12/08/2022   HGBA1C 6.0 (H) 08/22/2014    Patient aware of disease state and risk of progression. This may contribute to abnormal cravings, fatigue and diabetic complications without having diabetes.   We reviewed treatment options which includes losing 7 to 10% of body weight, increasing physical activity to a goal of 150 minutes a week at moderate intensity.  She did not tolerate immediate release metformin.  We will try extended release as she benefits from pharmacoprophylaxis.  If she does not tolerate metformin at all and she may be a candidate for incretin therapy if not cost prohibitive.  Patient also counseled on reducing simple and added sugars from diet.

## 2023-01-06 NOTE — Progress Notes (Signed)
Office: [phone]  /  Fax: [phone]  WEIGHT SUMMARY AND BIOMETRICS  Vitals Temp: 97.6 F (36.4 C) BP: (!) 151/80 Pulse Rate: 74 SpO2: 95 %   Anthropometric Measurements Height: 5\' 4"  (1.626 m) Weight: 261 lb (118.4 kg) BMI (Calculated): 44.78 Weight at Last Visit: 261lb Weight Lost Since Last Visit: 0 Weight Gained Since Last Visit: 0 Starting Weight: 262lb Total Weight Loss (lbs): 1 lb (0.454 kg) Waist Measurement : 50 inches   Body Composition  Body Fat %: 55 % Fat Mass (lbs): 143.6 lbs Muscle Mass (lbs): 111.4 lbs Total Body Water (lbs): 91.2 lbs Visceral Fat Rating : 20    No data recorded Today's Visit #: 3  Starting Date: 12/08/22   HPI  Chief Complaint: OBESITY  [name] is here to discuss her progress with her obesity treatment plan. She is on the the Category 2 Plan and states she is following her eating plan approximately 30 % of the time. [facility] and doing [facility] work 60 minutes 2 times per week.  Interval History:  Since last office visit she has maintained. She reports suboptimal adherence She has been working on not skipping meals, avoiding and or reducing liquid calories, and working on gradual implementation of reduced calorie nutrition plan.  Last office visit she was started on metformin immediate release for diabetes prevention.  Unfortunately she is experiencing diarrhea when she takes medication so she discontinued. 24-hour recall: For breakfast she had an old meal and a banana, for lunch she had a peanut butter sandwich with carrots and an apple. Orixegenic Control: Denies problems with appetite and hunger signals.  Denies problems with satiety and satiation.  Denies problems with eating patterns and portion control.  Denies abnormal cravings. Denies feeling deprived or restricted.   Barriers identified: none and difficulty implementing reduced calorie nutrition plan.   Pharmacotherapy for weight  loss: She is currently taking Metformin (off label use for incretin effect and / or insulin resistance and / or diabetes prevention) with adequate clinical response  and experiencing the following side effects: Diarrhea..    ASSESSMENT AND PLAN  TREATMENT PLAN FOR OBESITY:  Recommended Dietary Goals  [name] is currently in the action stage of change. As such, her goal is to continue weight management plan. She has agreed to: continue current plan  Behavioral Intervention  We discussed the following Behavioral Modification Strategies today: increasing lean protein intake, decreasing simple carbohydrates , increasing vegetables, increasing lower glycemic fruits, increasing fiber rich foods, increasing water intake, work on meal planning and preparation, and continue to work on implementation of reduced calorie nutritional plan.  Additional resources provided today: Handout on healthy eating and balanced plate and Handout on complex carbohydrates and lean sources of protein  Recommended Physical Activity Goals  [name] has been advised to work up to 150 minutes of moderate intensity aerobic activity a week and strengthening exercises 2-3 times per week for cardiovascular health, weight loss maintenance and preservation of muscle mass.   She has agreed to :  Think about ways to increase daily physical activity and overcoming barriers to exercise and Increase physical activity in their day and reduce sedentary time (increase NEAT).  Pharmacotherapy We discussed various medication options to help [name] with her weight loss efforts and we both agreed to :  We will discontinue metformin immediate release and switch to extended release to see if she tolerates medication.  She does benefit from pharmacoprophylaxis with medication.  ASSOCIATED CONDITIONS  ADDRESSED TODAY  Prediabetes Assessment & Plan: Most recent A1c is  Lab Results  Component Value Date   HGBA1C 6.1 (H) 12/08/2022    HGBA1C 6.0 (H) 08/22/2014    Patient aware of disease state and risk of progression. This may contribute to abnormal cravings, fatigue and diabetic complications without having diabetes.   We reviewed treatment options which includes losing 7 to 10% of body weight, increasing physical activity to a goal of 150 minutes a week at moderate intensity.  She did not tolerate immediate release metformin.  We will try extended release as she benefits from pharmacoprophylaxis.  If she does not tolerate metformin at all and she may be a candidate for incretin therapy if not cost prohibitive.  Patient also counseled on reducing simple and added sugars from diet.   Orders: -     metFORMIN HCl ER; Take 1 tablet (500 mg total) by mouth daily with breakfast.  Dispense: 30 tablet; Refill: 0  Obesity with starting BMI of 45.0  Elevated blood pressure reading without diagnosis of hypertension Assessment & Plan: Her blood pressure was elevated on repeat readings.  She does have a blood pressure monitor at home and was instructed on how to monitor blood pressure.  I would like for her to check her blood pressure in the morning and also before bedtime.  If her blood pressures are above 130/80 she will reach out to her primary care team for initiation of medications if needed.  Losing 10% of body weight may improve blood pressure control.     PHYSICAL EXAM:  Blood pressure (!) 151/80, pulse 74, temperature 97.6 F (36.4 C), height 5\' 4"  (1.626 m), weight 261 lb (118.4 kg), SpO2 95 %. Body mass index is 44.8 kg/m.  General: She is overweight, cooperative, alert, well developed, and in no acute distress. PSYCH: Has normal mood, affect and thought process.   HEENT: EOMI, sclerae are anicteric. Lungs: Normal breathing effort, no conversational dyspnea. Extremities: No edema.  Neurologic: No gross sensory or motor deficits. No tremors or fasciculations noted.    DIAGNOSTIC DATA REVIEWED:  BMET     Component Value Date/Time   NA 137 11/20/2022 1059   NA 140 08/31/2022 0812   K 4.0 11/20/2022 1059   CL 101 11/20/2022 1059   CO2 26 11/20/2022 1059   GLUCOSE 138 (H) 11/20/2022 1059   BUN 22 11/20/2022 1059   BUN 18 08/31/2022 0812   CREATININE 0.76 11/20/2022 1059   CREATININE 0.80 12/30/2015 1138   CALCIUM 9.4 11/20/2022 1059   GFRNONAA >60 11/20/2022 1059   GFRAA 84 06/30/2020 0848   Lab Results  Component Value Date   HGBA1C 6.1 (H) 12/08/2022   HGBA1C 6.0 (H) 08/22/2014   Lab Results  Component Value Date   INSULIN 9.0 12/08/2022   Lab Results  Component Value Date   TSH 1.900 12/08/2022   CBC    Component Value Date/Time   WBC 5.7 11/20/2022 1059   RBC 4.14 11/20/2022 1059   HGB 13.1 11/20/2022 1059   HGB 13.3 08/31/2017 0807   HGB 13.3 07/22/2011 1440   HCT 40.9 11/20/2022 1059   HCT 40.7 08/31/2017 0807   HCT 40.1 07/22/2011 1440   PLT 206 11/20/2022 1059   PLT 280 08/31/2017 0807   MCV 98.8 11/20/2022 1059   MCV 94 08/31/2017 0807   MCV 92.5 07/22/2011 1440   MCH 31.6 11/20/2022 1059   MCHC 32.0 11/20/2022 1059   RDW 13.2 11/20/2022 1059  RDW 14.0 08/31/2017 0807   RDW 13.7 07/22/2011 1440   Iron Studies No results found for: "IRON", "TIBC", "FERRITIN", "IRONPCTSAT" Lipid Panel     Component Value Date/Time   CHOL 111 12/08/2022 0946   TRIG 155 (H) 12/08/2022 0946   HDL 44 12/08/2022 0946   CHOLHDL 2.8 08/31/2022 0812   CHOLHDL 5.0 02/06/2020 0859   VLDL 46 (H) 02/06/2020 0859   LDLCALC 41 12/08/2022 0946   Hepatic Function Panel     Component Value Date/Time   PROT 7.4 11/20/2022 1059   PROT 7.4 08/31/2022 0812   ALBUMIN 4.3 11/20/2022 1059   ALBUMIN 4.8 08/31/2022 0812   AST 29 11/20/2022 1059   ALT 22 11/20/2022 1059   ALKPHOS 96 11/20/2022 1059   BILITOT 0.6 11/20/2022 1059   BILITOT 0.5 08/31/2022 0812   BILIDIR 0.15 08/31/2022 0812   IBILI 0.4 01/26/2021 1032      Component Value Date/Time   TSH 1.900 12/08/2022 0946    Nutritional Lab Results  Component Value Date   VD25OH 80.8 12/08/2022   VD25OH 52.0 12/21/2018   VD25OH 32 08/22/2014     Return in about 3 weeks (around 01/27/2023) for with Dr. Lawson Radar.. She was informed of the importance of frequent follow up visits to maximize her success with intensive lifestyle modifications for her multiple health conditions.   ATTESTASTION STATEMENTS:  Reviewed by clinician on day of visit: allergies, medications, problem list, medical history, surgical history, family history, social history, and previous encounter notes.     Worthy Rancher, MD

## 2023-01-06 NOTE — Assessment & Plan Note (Signed)
Her blood pressure was elevated on repeat readings.  She does have a blood pressure monitor at home and was instructed on how to monitor blood pressure.  I would like for her to check her blood pressure in the morning and also before bedtime.  If her blood pressures are above 130/80 she will reach out to her primary care team for initiation of medications if needed.  Losing 10% of body weight may improve blood pressure control.

## 2023-01-10 LAB — CUP PACEART REMOTE DEVICE CHECK
Date Time Interrogation Session: 20240628124013
Implantable Lead Connection Status: 753985
Implantable Lead Connection Status: 753985
Implantable Lead Implant Date: 20170627
Implantable Lead Implant Date: 20170627
Implantable Lead Location: 753859
Implantable Lead Location: 753860
Implantable Lead Model: 377
Implantable Lead Model: 377
Implantable Lead Serial Number: 49436227
Implantable Lead Serial Number: 49471106
Implantable Pulse Generator Implant Date: 20170627
Pulse Gen Model: 394969
Pulse Gen Serial Number: 68786455

## 2023-01-11 ENCOUNTER — Telehealth (INDEPENDENT_AMBULATORY_CARE_PROVIDER_SITE_OTHER): Payer: PPO | Admitting: Psychology

## 2023-01-11 DIAGNOSIS — F319 Bipolar disorder, unspecified: Secondary | ICD-10-CM | POA: Diagnosis not present

## 2023-01-11 DIAGNOSIS — F5089 Other specified eating disorder: Secondary | ICD-10-CM

## 2023-01-11 DIAGNOSIS — F419 Anxiety disorder, unspecified: Secondary | ICD-10-CM | POA: Diagnosis not present
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
## 2023-01-12 ENCOUNTER — Encounter (INDEPENDENT_AMBULATORY_CARE_PROVIDER_SITE_OTHER): Payer: Self-pay | Admitting: Family Medicine

## 2023-01-17 ENCOUNTER — Telehealth (INDEPENDENT_AMBULATORY_CARE_PROVIDER_SITE_OTHER): Payer: Self-pay | Admitting: Psychology

## 2023-01-17 ENCOUNTER — Telehealth (INDEPENDENT_AMBULATORY_CARE_PROVIDER_SITE_OTHER): Payer: PPO | Admitting: Psychology

## 2023-01-17 NOTE — Telephone Encounter (Signed)
  Office: [phone]  /  Fax: [phone]  Date of Call: January 17, 2023  Time of Call: 8:10am Duration of Call: ~9 minute(s) Provider: Lawerance Cruel, PsyD  CONTENT: This provider called [name] to follow-up regarding a MyChart message she sent. She explained she did not want anyone seeing her medical records and think she is "currently suicidal." This provider explained passive suicidal ideation and she acknowledged understanding. As such, this provider will addend one of the sentences in the intake note to further clarify the presence of passive suicidal ideation at this time. [name] was receptive as evidenced by her stating, "I appreciate that." Additionally, [name] reported "not feeling as good emotionally" and "not being able to think as clearly" due to metformin. It was recommended she contact her prescribing provider after this call. She agreed. She was also encouraged to contact her primary therapist. A risk assessment was completed. [name] denied experiencing suicidal, self-harm and homicidal ideation, plan, or intent since the last appointment with this provider. All questions/concerns addressed.   PLAN: [name] is scheduled for an appointment on 02/22/2023 at 11am via MyChart Video Visit. No further follow-up planned by this provider.

## 2023-01-18 ENCOUNTER — Telehealth: Payer: PPO | Admitting: Adult Health

## 2023-01-18 DIAGNOSIS — G4733 Obstructive sleep apnea (adult) (pediatric): Secondary | ICD-10-CM

## 2023-01-19 NOTE — Progress Notes (Signed)
Remote pacemaker transmission.   

## 2023-01-25 DIAGNOSIS — F4323 Adjustment disorder with mixed anxiety and depressed mood: Secondary | ICD-10-CM | POA: Diagnosis not present

## 2023-01-28 ENCOUNTER — Other Ambulatory Visit (INDEPENDENT_AMBULATORY_CARE_PROVIDER_SITE_OTHER): Payer: Self-pay | Admitting: Family Medicine

## 2023-01-28 DIAGNOSIS — R7303 Prediabetes: Secondary | ICD-10-CM

## 2023-01-28 DIAGNOSIS — G4733 Obstructive sleep apnea (adult) (pediatric): Secondary | ICD-10-CM | POA: Diagnosis not present

## 2023-01-31 ENCOUNTER — Other Ambulatory Visit: Payer: Self-pay

## 2023-01-31 ENCOUNTER — Emergency Department (HOSPITAL_COMMUNITY): Payer: PPO

## 2023-01-31 ENCOUNTER — Emergency Department (HOSPITAL_COMMUNITY)
Admission: EM | Admit: 2023-01-31 | Discharge: 2023-01-31 | Disposition: A | Discharge status: Home / Self Care | Payer: PPO | Source: Home / Self Care | Attending: Emergency Medicine | Admitting: Emergency Medicine

## 2023-01-31 ENCOUNTER — Encounter (HOSPITAL_COMMUNITY): Payer: Self-pay

## 2023-01-31 DIAGNOSIS — Z950 Presence of cardiac pacemaker: Secondary | ICD-10-CM | POA: Insufficient documentation

## 2023-01-31 DIAGNOSIS — R0602 Shortness of breath: Secondary | ICD-10-CM | POA: Insufficient documentation

## 2023-01-31 DIAGNOSIS — M7989 Other specified soft tissue disorders: Secondary | ICD-10-CM

## 2023-01-31 DIAGNOSIS — I110 Hypertensive heart disease with heart failure: Secondary | ICD-10-CM | POA: Insufficient documentation

## 2023-01-31 DIAGNOSIS — I509 Heart failure, unspecified: Secondary | ICD-10-CM | POA: Insufficient documentation

## 2023-01-31 DIAGNOSIS — Z853 Personal history of malignant neoplasm of breast: Secondary | ICD-10-CM | POA: Insufficient documentation

## 2023-01-31 DIAGNOSIS — Z79899 Other long term (current) drug therapy: Secondary | ICD-10-CM | POA: Diagnosis not present

## 2023-01-31 DIAGNOSIS — Z7722 Contact with and (suspected) exposure to environmental tobacco smoke (acute) (chronic): Secondary | ICD-10-CM | POA: Diagnosis not present

## 2023-01-31 DIAGNOSIS — Z7982 Long term (current) use of aspirin: Secondary | ICD-10-CM | POA: Insufficient documentation

## 2023-01-31 DIAGNOSIS — R2242 Localized swelling, mass and lump, left lower limb: Secondary | ICD-10-CM | POA: Diagnosis not present

## 2023-01-31 DIAGNOSIS — R600 Localized edema: Secondary | ICD-10-CM | POA: Diagnosis not present

## 2023-01-31 DIAGNOSIS — M79605 Pain in left leg: Secondary | ICD-10-CM | POA: Diagnosis not present

## 2023-01-31 LAB — CBC WITH DIFFERENTIAL/PLATELET
Abs Immature Granulocytes: 0.02 10*3/uL (ref 0.00–0.07)
Basophils Absolute: 0 10*3/uL (ref 0.0–0.1)
Basophils Relative: 1 %
Eosinophils Absolute: 0.1 10*3/uL (ref 0.0–0.5)
Eosinophils Relative: 2 %
HCT: 39.6 % (ref 36.0–46.0)
Hemoglobin: 12.8 g/dL (ref 12.0–15.0)
Immature Granulocytes: 0 %
Lymphocytes Relative: 32 %
Lymphs Abs: 1.9 10*3/uL (ref 0.7–4.0)
MCH: 31.8 pg (ref 26.0–34.0)
MCHC: 32.3 g/dL (ref 30.0–36.0)
MCV: 98.5 fL (ref 80.0–100.0)
Monocytes Absolute: 0.6 10*3/uL (ref 0.1–1.0)
Monocytes Relative: 10 %
Neutro Abs: 3.2 10*3/uL (ref 1.7–7.7)
Neutrophils Relative %: 55 %
Platelets: 227 10*3/uL (ref 150–400)
RBC: 4.02 MIL/uL (ref 3.87–5.11)
RDW: 13.4 % (ref 11.5–15.5)
WBC: 6 10*3/uL (ref 4.0–10.5)
nRBC: 0 % (ref 0.0–0.2)

## 2023-01-31 LAB — COMPREHENSIVE METABOLIC PANEL WITH GFR
ALT: 24 U/L (ref 0–44)
AST: 24 U/L (ref 15–41)
Albumin: 4.2 g/dL (ref 3.5–5.0)
Alkaline Phosphatase: 95 U/L (ref 38–126)
Anion gap: 10 (ref 5–15)
BUN: 21 mg/dL (ref 8–23)
CO2: 26 mmol/L (ref 22–32)
Calcium: 9.5 mg/dL (ref 8.9–10.3)
Chloride: 102 mmol/L (ref 98–111)
Creatinine, Ser: 0.71 mg/dL (ref 0.44–1.00)
GFR, Estimated: >60 mL/min
Glucose, Bld: 103 mg/dL — ABNORMAL HIGH (ref 70–99)
Potassium: 4.4 mmol/L (ref 3.5–5.1)
Sodium: 138 mmol/L (ref 135–145)
Total Bilirubin: 0.5 mg/dL (ref 0.3–1.2)
Total Protein: 7.6 g/dL (ref 6.5–8.1)

## 2023-01-31 LAB — TROPONIN I (HIGH SENSITIVITY): Troponin I (High Sensitivity): 3 ng/L

## 2023-01-31 LAB — CK: Total CK: 112 U/L (ref 38–234)

## 2023-01-31 LAB — D-DIMER, QUANTITATIVE: D-Dimer, Quant: 0.42 ug/mL-FEU (ref 0.00–0.50)

## 2023-01-31 LAB — MAGNESIUM: Magnesium: 2.2 mg/dL (ref 1.7–2.4)

## 2023-01-31 NOTE — ED Notes (Signed)
Pt states hx of CHF and having weakness for the past 3 days. Pt denies CP at this time.

## 2023-01-31 NOTE — Discharge Instructions (Signed)
Today's workup without any acute findings.  Nothing to explain the shortness of breath.  Return for any new or worse symptoms.  Keep your appointment with Dr. Ladona Ridgel.

## 2023-01-31 NOTE — ED Provider Notes (Signed)
[place] [facility]ERGENCY DEPARTMENT AT [facility] Provider Note   CSN: [id number] Arrival date & time: 01/31/23  1241     History  Chief Complaint  Patient presents with   Leg Swelling    [name] is a 72 y.o. female.  Patient presenting with several complaints.  1 is left leg swelling on and off.  But states that that is been occurring since injury a long time ago.  Patient's blood pressure has been fluctuating.  Currently blood pressure 151/66.  Patient has a history of congestive heart failure.  Patient has a history of a pacemaker.  Does have follow-up with Dr. Ladona Ridgel for reevaluation of the pacemaker.  Patient denies any chest pain.  Patient states shortness of breath with exertion for 1 week and the leg swelling for about a month.  Temp here 98.1 blood pressure is mention 151/66 pulse 64 respirations 16.  EKG here is consistent with paced rhythm.  Past medical history significant for bipolar disorder irritable bowel syndrome breast cancer in 2013 hyperlipidemia hypertension congestive heart failure bradycardia permanent cardiac pacemaker shortness of breath.  Patient's had her appendix removed and abdominal hysterectomy.  Patient is never used tobacco products but had passive exposure to smoking.         Home Medications Prior to Admission medications   Medication Sig Start Date End Date Taking? Authorizing Provider  acetaminophen (TYLENOL) 650 MG CR tablet Take 1,300 mg by mouth 2 (two) times daily.    [provider]  ALPRAZolam (XANAX XR) 1 MG 24 hr tablet Take 1 mg by mouth every morning.    [provider]  ALPRAZolam Prudy Feeler) 0.5 MG tablet Take 0.5 mg by mouth daily as needed for anxiety.    [provider]  aspirin EC 81 MG tablet Take 1 tablet (81 mg total) by mouth daily. Swallow whole. 02/04/22   Jonelle Sidle, MD  bevacizumab (AVASTIN) 1.25 mg/0.1 mL SOLN 1.25 mg by Intravitreal route to Surgery. Every 6 weeks.    [provider]  Cholecalciferol (VITAMIN D3) 1.25 MG (50000 UT) CAPS Take 50,000 Units by mouth every Sunday. Sunday 08/07/21   [provider]  glucosamine-chondroitin 500-400 MG tablet Take 2 tablets by mouth every morning.     [provider]  lamoTRIgine (LAMICTAL) 200 MG tablet Take 400 mg by mouth at bedtime.    [provider]  Lutein 20 MG CAPS Take 20 mg by mouth every morning. 01/28/21   [provider]  metFORMIN (GLUCOPHAGE-XR) 500 MG 24 hr tablet Take 1 tablet (500 mg total) by mouth daily with breakfast. 01/06/23   Worthy Rancher, MD  Multiple Vitamin (MULTIVITAMIN) tablet Take 1 tablet by mouth daily. Senior    [provider]  QUEtiapine (SEROQUEL) 300 MG tablet Take 600 mg by mouth at bedtime. 05/07/13   Gayland Curry, MD  rosuvastatin (CRESTOR) 10 MG tablet TAKE 1 TABLET(10 MG) BY MOUTH DAILY Patient taking differently: Take 10 mg by mouth in the morning. 02/02/22   Jonelle Sidle, MD      Allergies    Imipramine and Tricor [fenofibrate]    Review of Systems   Review of Systems  Constitutional:  Negative for chills and fever.  HENT:  Negative for ear pain and sore throat.   Eyes:  Negative for pain and visual disturbance.  Respiratory:  Positive for shortness of breath. Negative for cough.   Cardiovascular:  Positive for leg swelling. Negative for chest pain and  palpitations.  Gastrointestinal:  Negative for abdominal pain and vomiting.  Genitourinary:  Negative for dysuria and hematuria.  Musculoskeletal:  Negative for arthralgias and back pain.  Skin:  Negative for color change and rash.  Neurological:  Negative for seizures and syncope.  All other systems reviewed and are negative.   Physical Exam Updated Vital Signs BP (!) 154/69   Pulse 61   Temp 98.1 F (36.7 C) (Oral)   Resp 19   Ht 1.626 m (5\' 4" )   Wt 118.8 kg   SpO2 95%   BMI 44.97 kg/m  Physical Exam Vitals and nursing note reviewed.   Constitutional:      General: She is not in acute distress.    Appearance: Normal appearance. She is well-developed. She is not ill-appearing.  HENT:     Head: Normocephalic and atraumatic.  Eyes:     Extraocular Movements: Extraocular movements intact.     Conjunctiva/sclera: Conjunctivae normal.     Pupils: Pupils are equal, round, and reactive to light.  Cardiovascular:     Rate and Rhythm: Normal rate and regular rhythm.     Heart sounds: No murmur heard. Pulmonary:     Effort: Pulmonary effort is normal. No respiratory distress.     Breath sounds: Normal breath sounds. No wheezing, rhonchi or rales.  Abdominal:     Palpations: Abdomen is soft.     Tenderness: There is no abdominal tenderness.  Musculoskeletal:        General: No swelling.     Cervical back: Normal range of motion and neck supple.     Left lower leg: Edema present.  Skin:    General: Skin is warm and dry.     Capillary Refill: Capillary refill takes less than 2 seconds.  Neurological:     General: No focal deficit present.     Mental Status: She is alert and oriented to person, place, and time.  Psychiatric:        Mood and Affect: Mood normal.     ED Results / Procedures / Treatments   Labs (all labs ordered are listed, but only abnormal results are displayed) Labs Reviewed  COMPREHENSIVE METABOLIC PANEL - Abnormal; Notable for the following components:      Result Value   Glucose, Bld 103 (*)    All other components within normal limits  D-DIMER, QUANTITATIVE  CBC WITH DIFFERENTIAL/PLATELET  MAGNESIUM  CK  TROPONIN I (HIGH SENSITIVITY)    EKG EKG Interpretation Date/Time:  Monday January 31 2023 12:52:26 EDT Ventricular Rate:  63 PR Interval:  238 QRS Duration:  92 QT Interval:  398 QTC Calculation: 407 R Axis:   -29  Text Interpretation: Atrial-paced rhythm with prolonged AV conduction Minimal voltage criteria for LVH, may be normal variant ( R in aVL ) Abnormal ECG When compared with  ECG of 20-Nov-2022 10:25, PREVIOUS ECG IS PRESENT Confirmed by Vanetta Mulders [phone]) on 01/31/2023 2:04:32 PM  Radiology DG Chest 2 View  Result Date: 01/31/2023 CLINICAL DATA:  Shortness of breath on exertion. EXAM: CHEST - 2 VIEW COMPARISON:  11/20/2022 FINDINGS: Unchanged cardiac silhouette and mediastinal contours given improved inspiratory effort. Stable positioning of support apparatus. No focal airspace opacities. No pleural effusion or pneumothorax. No evidence of edema. No acute osseous abnormalities. Stigmata of dish within the lower thoracic spine. IMPRESSION: No acute cardiopulmonary disease. Electronically Signed   By: Simonne Come M.D.   On: 01/31/2023 13:36   US Venous Img Lower Unilateral Left (DVT)  Result Date: 01/31/2023 CLINICAL DATA:  Left lower extremity pain and edema with shortness of breath for the past 2 days. Evaluate for DVT. EXAM: LEFT LOWER EXTREMITY VENOUS DOPPLER ULTRASOUND TECHNIQUE: Gray-scale sonography with graded compression, as well as color Doppler and duplex ultrasound were performed to evaluate the lower extremity deep venous systems from the level of the common femoral vein and including the common femoral, femoral, profunda femoral, popliteal and calf veins including the posterior tibial, peroneal and gastrocnemius veins when visible. The superficial great saphenous vein was also interrogated. Spectral Doppler was utilized to evaluate flow at rest and with distal augmentation maneuvers in the common femoral, femoral and popliteal veins. COMPARISON:  Left lower extremity venous Doppler ultrasound-03/08/2014 (negative) FINDINGS: Contralateral Common Femoral Vein: Respiratory phasicity is normal and symmetric with the symptomatic side. No evidence of thrombus. Normal compressibility. Common Femoral Vein: No evidence of thrombus. Normal compressibility, respiratory phasicity and response to augmentation. Saphenofemoral Junction: No evidence of thrombus. Normal  compressibility and flow on color Doppler imaging. Profunda Femoral Vein: No evidence of thrombus. Normal compressibility and flow on color Doppler imaging. Femoral Vein: No evidence of thrombus. Normal compressibility, respiratory phasicity and response to augmentation. Popliteal Vein: No evidence of thrombus. Normal compressibility, respiratory phasicity and response to augmentation. Calf Veins: No evidence of thrombus. Normal compressibility and flow on color Doppler imaging. Superficial Great Saphenous Vein: No evidence of thrombus. Normal compressibility. Other Findings:  None. IMPRESSION: No evidence of DVT within the left lower extremity. Electronically Signed   By: Simonne Come M.D.   On: 01/31/2023 13:35    Procedures Procedures    Medications Ordered in ED Medications - No data to display  ED Course/ Medical Decision Making/ A&P                             Medical Decision Making Amount and/or Complexity of Data Reviewed Labs: ordered. Radiology: ordered.   Patient's lab workup here without any significant findings.  D-dimer was 0.42 CBC white count was 6 hemoglobin 12 platelets are 227.  Patient's initial troponin was 3 delta troponin not required.  Total CK was 112 not sure why that was ordered.  Magnesium good at 2.2 complete metabolic panel without any significant abnormalities.  Doppler study negative for DVT of left leg.  Chest x-ray no acute cardiopulmonary disease.  EKG consistent with a paced rhythm.  No acute findings.  Patient is oxygen saturations mid to upper 90s on room air.  Patient stable for discharge home close follow-up with cardiology.   Final Clinical Impression(s) / ED Diagnoses Final diagnoses:  Left leg swelling  Shortness of breath    Rx / DC Orders ED Discharge Orders     None         Vanetta Mulders, MD 01/31/23 1433

## 2023-01-31 NOTE — ED Triage Notes (Signed)
Pt now complains of SOB on exertion x 1 week

## 2023-01-31 NOTE — ED Triage Notes (Signed)
LEFT leg swelling on and off x1 month  BP's have also been up and down Hx of CHF

## 2023-02-02 ENCOUNTER — Other Ambulatory Visit: Payer: Self-pay

## 2023-02-02 ENCOUNTER — Encounter: Payer: Self-pay | Admitting: Emergency Medicine

## 2023-02-02 ENCOUNTER — Ambulatory Visit
Admission: EM | Admit: 2023-02-02 | Discharge: 2023-02-02 | Disposition: A | Discharge status: Home / Self Care | Payer: PPO | Attending: Family Medicine | Admitting: Family Medicine

## 2023-02-02 ENCOUNTER — Telehealth: Payer: PPO | Admitting: Family Medicine

## 2023-02-02 DIAGNOSIS — H9202 Otalgia, left ear: Secondary | ICD-10-CM | POA: Diagnosis not present

## 2023-02-02 DIAGNOSIS — H6122 Impacted cerumen, left ear: Secondary | ICD-10-CM | POA: Diagnosis not present

## 2023-02-02 DIAGNOSIS — H9191 Unspecified hearing loss, right ear: Secondary | ICD-10-CM

## 2023-02-02 DIAGNOSIS — H9201 Otalgia, right ear: Secondary | ICD-10-CM

## 2023-02-02 NOTE — ED Triage Notes (Addendum)
Pt reports left ear fullness x3 days. Pt was seen via virtual visit and was told to come to UC for further evaluation due to "hearing loss on left side".

## 2023-02-02 NOTE — Progress Notes (Signed)
Because with hearing loss- we need to have you seen in person to have someone look in your ear and make sure there is nothing blocking the canal. If you PCP can't see you today- you can go to a local Urgent Care.    NOTE: There will be NO CHARGE for this eVisit

## 2023-02-02 NOTE — ED Provider Notes (Signed)
[name]   [id number] 02/02/23 Arrival Time: 1428  ASSESSMENT & PLAN:  1. Otalgia, left   2. Impacted cerumen of left ear    Feeling much better after flushing left ear. No signs of infection. OTC as needed. May f/u as needed.  Reviewed expectations re: course of current medical issues. Questions answered. Outlined signs and symptoms indicating need for more acute intervention. Patient verbalized understanding. After Visit Summary given.   SUBJECTIVE: History from: patient.  [name] is a 72 y.o. female who presents with complaint of left otalgia; without drainage; without bleeding. Onset gradual,  2-3 d ago . Recent cold symptoms: none. Fever: denies. Overall normal PO intake without n/v. Sick contacts: denies. Seen via virtual visit; recommended in person evaluation.  Social History   Tobacco Use  Smoking Status Never   Passive exposure: Never  Smokeless Tobacco Never    OBJECTIVE:  Vitals:   02/02/23 1539  BP: 135/83  Pulse: 92  Resp: 20  Temp: 97.8 F (36.6 C)  TempSrc: Oral  SpO2: 94%     General appearance: alert; NAD Ear Canal: cerumen on the left TM: left: normal after ear flushing Neck: supple with FROM Skin: warm and dry Psychological: alert and cooperative; normal mood and affect  Allergies  Allergen Reactions   Imipramine Rash   Tricor [Fenofibrate] Other (See Comments)    Pain, "aching"    Past Medical History:  Diagnosis Date   Anxiety    Back pain    Bipolar 1 disorder (HCC)    Bradycardia, sinus    Breast cancer (HCC)    Breast cancer, left breast (HCC) 07/22/2011   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 Ladona Ridgel   Sleep apnea    SOB (shortness of breath)    Family History  Problem Relation Age of Onset   Cancer Mother    Breast cancer Mother    Bipolar disorder Mother    Diabetes Mother    Depression Mother    Obesity Mother    Alcohol abuse Father    Hypertension Father    Alcoholism Father    Bipolar disorder Sister    Diabetes Sister    Heart disease Other    Social History   Socioeconomic History   Marital status: Married    Spouse name: Not on file   Number of children: 3   Years of education: college   Highest education level: Associate degree: occupational, Scientist, product/process development, or vocational program  Occupational History   Occupation: Charity fundraiser   Occupation: Retired Charity fundraiser  Tobacco Use   Smoking status: Never    Passive exposure: Never   Smokeless tobacco: Never  Vaping Use   Vaping status: Never Used  Substance and Sexual Activity   Alcohol use: No    Alcohol/week: 0.0 standard drinks of alcohol   Drug use: No   Sexual  activity: Not Currently  Other Topics Concern   Not on file  Social History Narrative   04/24/2013 [name] was born and grew up in [place]. She reports that her childhood was "tough." She has 2 older sisters and a younger brother. She achieved an Associates Degree in nursing. She has been working as an Charity fundraiser for 40 years. She is separated from her husband for 6 years. She has 2 daughters and one son. She denies any legal difficulties. She affiliates as Curator. Her hobbies include reading, sewing, crafts, and cooking. She reports that her social support system consists of her friend and passed her. 04/24/2013 [name]      Lives alone.   Right-handed.   No caffeine use.   Social Determinants of Health   Financial Resource Strain: Low Risk  (09/10/2022)    Overall Financial Resource Strain (CARDIA)    Difficulty of Paying Living Expenses: Not hard at all  Food Insecurity: No Food Insecurity (09/10/2022)   Hunger Vital Sign    Worried About Running Out of Food in the Last Year: Never true    Ran Out of Food in the Last Year: Never true  Transportation Needs: No Transportation Needs (09/10/2022)   PRAPARE - Administrator, Civil Service (Medical): No    Lack of Transportation (Non-Medical): No  Physical Activity: Sufficiently Active (09/10/2022)   Exercise Vital Sign    Days of Exercise per Week: 3 days    Minutes of Exercise per Session: 60 min  Stress: No Stress Concern Present (09/10/2022)   [employer] of Occupational Health - Occupational Stress Questionnaire    Feeling of Stress : Not at all  Social Connections: Moderately Integrated (09/10/2022)   Social Connection and Isolation Panel [NHANES]    Frequency of Communication with Friends and Family: More than three times a week    Frequency of Social Gatherings with Friends and Family: Three times a week    Attends Religious Services: More than 4 times per year    Active Member of Clubs or Organizations: Yes    Attends Banker Meetings: More than 4 times per year    Marital Status: Separated  Intimate Partner Violence: Not At Risk (09/10/2022)   Humiliation, Afraid, Rape, and Kick questionnaire    Fear of Current or Ex-Partner: No    Emotionally Abused: No    Physically Abused: No    Sexually Abused: No             Andrea Layman, MD 02/02/23 [phone]

## 2023-02-08 ENCOUNTER — Encounter (INDEPENDENT_AMBULATORY_CARE_PROVIDER_SITE_OTHER): Payer: Self-pay | Admitting: Family Medicine

## 2023-02-08 ENCOUNTER — Ambulatory Visit (INDEPENDENT_AMBULATORY_CARE_PROVIDER_SITE_OTHER): Payer: PPO | Admitting: Family Medicine

## 2023-02-08 VITALS — BP 122/80 | HR 75 | Temp 97.6°F | Ht 64.0 in | Wt 261.0 lb

## 2023-02-08 DIAGNOSIS — R7303 Prediabetes: Secondary | ICD-10-CM | POA: Diagnosis not present

## 2023-02-08 DIAGNOSIS — E559 Vitamin D deficiency, unspecified: Secondary | ICD-10-CM | POA: Diagnosis not present

## 2023-02-08 DIAGNOSIS — E669 Obesity, unspecified: Secondary | ICD-10-CM | POA: Diagnosis not present

## 2023-02-08 DIAGNOSIS — Z6841 Body Mass Index (BMI) 40.0 and over, adult: Secondary | ICD-10-CM | POA: Diagnosis not present

## 2023-02-08 NOTE — Progress Notes (Signed)
Chief Complaint:   OBESITY [name] is here to discuss her progress with her obesity treatment plan along with follow-up of her obesity related diagnoses. [name] is on the Category 2 Plan and states she is following her eating plan approximately 35% of the time. [name] states she is doing cardio and strengthening 3 times per week.  Today's visit was #: 4 Starting weight: 262 lbs Starting date: 12/08/2022 Today's weight: 261 lbs Today's date: 02/08/2023 Total lbs lost to date: 1 Total lbs lost since last in-office visit: 0  Interim History: Patient had 2 ED visits in the last week and a half.  Most recent was due to shortness of breath and left leg swelling and then the second was for ear pain.  She has implemented protein in the am by adding a half cup of cottage cheese.  She did log for a couple of weeks and is shooting for 1500 calories and was around that but couldn't recall the total protein amount. She feels that logging will be the easiest to commit to for the next few weeks.  She is having some trouble making the commitment to the meal plan.   Subjective:   1. Prediabetes Patient is on metformin 500 mg once daily.  She had diarrhea on metformin and did not feel well on it.  2. Vitamin D deficiency Patient is on a OTC vitamin D now once weekly.  Last vitamin D level was of 80.  Assessment/Plan:   1. Prediabetes Patient agreed to discontinue metformin, and we will follow-up at her next appointment.  2. Vitamin D deficiency Patient was told that she can go down to 5000 units daily of Vitamin D.  3. BMI 40.0-44.9, adult (HCC)  4. Obesity with starting BMI of 45.0 [name] is currently in the action stage of change. As such, her goal is to continue with weight loss efforts. She has agreed to keeping a food journal and adhering to recommended goals of 1200-1300 calories and 85+ grams of protein daily.   Exercise goals: As is.   Behavioral modification strategies:  increasing lean protein intake, meal planning and cooking strategies, keeping healthy foods in the home, and planning for success.  [name] has agreed to follow-up with our clinic in 2 to 3 weeks. She was informed of the importance of frequent follow-up visits to maximize her success with intensive lifestyle modifications for her multiple health conditions.   Objective:   Blood pressure 122/80, pulse 75, temperature 97.6 F (36.4 C), height 5\' 4"  (1.626 m), weight 261 lb (118.4 kg), SpO2 98%. Body mass index is 44.8 kg/m.  General: Cooperative, alert, well developed, in no acute distress. HEENT: Conjunctivae and lids unremarkable. Cardiovascular: Regular rhythm.  Lungs: Normal work of breathing. Neurologic: No focal deficits.   Lab Results  Component Value Date   CREATININE 0.71 01/31/2023   BUN 21 01/31/2023   NA 138 01/31/2023   K 4.4 01/31/2023   CL 102 01/31/2023   CO2 26 01/31/2023   Lab Results  Component Value Date   ALT 24 01/31/2023   AST 24 01/31/2023   ALKPHOS 95 01/31/2023   BILITOT 0.5 01/31/2023   Lab Results  Component Value Date   HGBA1C 6.1 (H) 12/08/2022   HGBA1C 6.1 (H) 08/31/2022   HGBA1C 5.9 (H) 02/08/2022   HGBA1C 6.0 (H) 08/28/2021   HGBA1C 5.9 (H) 02/05/2021   Lab Results  Component Value Date   INSULIN 9.0 12/08/2022   Lab Results  Component Value  Date   TSH 1.900 12/08/2022   Lab Results  Component Value Date   CHOL 111 12/08/2022   HDL 44 12/08/2022   LDLCALC 41 12/08/2022   TRIG 155 (H) 12/08/2022   CHOLHDL 2.8 08/31/2022   Lab Results  Component Value Date   VD25OH 80.8 12/08/2022   VD25OH 52.0 12/21/2018   VD25OH 32 08/22/2014   Lab Results  Component Value Date   WBC 6.0 01/31/2023   HGB 12.8 01/31/2023   HCT 39.6 01/31/2023   MCV 98.5 01/31/2023   PLT 227 01/31/2023   No results found for: "IRON", "TIBC", "FERRITIN"  Attestation Statements:   Reviewed by clinician on day of visit: allergies, medications,  problem list, medical history, surgical history, family history, social history, and previous encounter notes.   I, Burt Knack, am acting as transcriptionist for Reuben Likes, MD.  I have reviewed the above documentation for accuracy and completeness, and I agree with the above. - Reuben Likes, MD

## 2023-02-10 ENCOUNTER — Ambulatory Visit: Payer: PPO | Attending: Internal Medicine | Admitting: Internal Medicine

## 2023-02-10 VITALS — BP 132/80 | HR 60 | Ht 64.0 in | Wt 268.6 lb

## 2023-02-10 DIAGNOSIS — I495 Sick sinus syndrome: Secondary | ICD-10-CM

## 2023-02-10 NOTE — Progress Notes (Signed)
HPI Mrs. Foglia returns today for follow-up.  She is a very pleasant 72 year old retired Engineer, civil (consulting) with a history of sinus node dysfunction status post pacemaker insertion, morbid obesity, hypertension, diastolic heart failure, and acid reflux induced bronchospasm.  She has had occasional palpitations but for the most part her symptoms have been controlled.  She has class II dyspnea which is multifactorial.  She denies chest pain.  She has not had syncope. Her weight is  increased over the last year. Allergies  Allergen Reactions   Imipramine Rash   Tricor [Fenofibrate] Other (See Comments)    Pain, "aching"     Current Outpatient Medications  Medication Sig Dispense Refill   acetaminophen (TYLENOL) 650 MG CR tablet Take 1,300 mg by mouth 2 (two) times daily.     ALPRAZolam (XANAX XR) 1 MG 24 hr tablet Take 1 mg by mouth every morning.     ALPRAZolam (XANAX) 0.5 MG tablet Take 0.5 mg by mouth daily as needed for anxiety.     aspirin EC 81 MG tablet Take 1 tablet (81 mg total) by mouth daily. Swallow whole. 90 tablet 3   bevacizumab (AVASTIN) 1.25 mg/0.1 mL SOLN 1.25 mg by Intravitreal route to Surgery. Every 6 weeks.     glucosamine-chondroitin 500-400 MG tablet Take 2 tablets by mouth every morning.      lamoTRIgine (LAMICTAL) 200 MG tablet Take 400 mg by mouth at bedtime.     Lutein 20 MG CAPS Take 20 mg by mouth every morning.     Multiple Vitamin (MULTIVITAMIN) tablet Take 1 tablet by mouth daily. Senior     QUEtiapine (SEROQUEL) 300 MG tablet Take 600 mg by mouth at bedtime.     rosuvastatin (CRESTOR) 10 MG tablet TAKE 1 TABLET(10 MG) BY MOUTH DAILY (Patient taking differently: Take 10 mg by mouth in the morning.) 90 tablet 3   No current facility-administered medications for this visit.     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 Ladona Ridgel   Sleep apnea    SOB (shortness of breath)     ROS:   All systems reviewed and negative except as noted in the HPI.   Past Surgical History:  Procedure Laterality Date   ABDOMINAL HYSTERECTOMY     APPENDECTOMY     BREAST BIOPSY Left 2011   BREAST BIOPSY  2008   BREAST LUMPECTOMY Left 2008   CATARACT EXTRACTION     CHOLECYSTECTOMY     COLONOSCOPY N/A 09/13/2013   Procedure: COLONOSCOPY;  Surgeon: Malissa Hippo, MD;  Location: [facility];  Service: Endoscopy;  Laterality: N/A;  730   COLONOSCOPY WITH PROPOFOL N/A 07/23/2022   Procedure: COLONOSCOPY WITH PROPOFOL;  Surgeon: Lanelle Bal, DO;  Location: [facility];  Service: Endoscopy;  Laterality: N/A;  11:30 am   EP IMPLANTABLE DEVICE N/A 01/06/2016   Procedure: Pacemaker Implant;  Surgeon: Marinus Maw, MD;  Location: [facility] INVASIVE CV LAB;  Service: Cardiovascular;  Laterality: N/A;   ESOPHAGOGASTRODUODENOSCOPY N/A 01/17/2014   Procedure: ESOPHAGOGASTRODUODENOSCOPY (EGD);  Surgeon: Malissa Hippo, MD;  Location: [facility];  Service: Endoscopy;  Laterality: N/A;  245   POLYPECTOMY  07/23/2022   Procedure: POLYPECTOMY;  Surgeon: Lanelle Bal, DO;  Location: [facility];  Service: Endoscopy;;   RIGHT/LEFT HEART CATH AND CORONARY ANGIOGRAPHY N/A 01/01/2020   Procedure: RIGHT/LEFT HEART CATH AND CORONARY ANGIOGRAPHY;  Surgeon: Kathleene Hazel, MD;   Location: [facility] INVASIVE CV LAB;  Service: Cardiovascular;  Laterality: N/A;   TOTAL KNEE ARTHROPLASTY Bilateral right knee   2012, 2007     Family History  Problem Relation Age of Onset   Cancer Mother    Breast cancer Mother    Bipolar disorder Mother    Diabetes Mother    Depression Mother    Obesity Mother    Alcohol abuse Father    Hypertension Father    Alcoholism Father    Bipolar disorder Sister    Diabetes Sister    Heart disease Other      Social History   Socioeconomic History   Marital status: Married    Spouse name: Not on file   Number of children: 3   Years of education: college   Highest education level: Associate degree: occupational, Scientist, product/process development, or vocational program  Occupational History   Occupation: Charity fundraiser   Occupation: Retired Charity fundraiser  Tobacco Use   Smoking status: Never    Passive exposure: Never   Smokeless tobacco: Never  Vaping Use   Vaping status: Never Used  Substance and Sexual Activity   Alcohol use: No    Alcohol/week: 0.0 standard drinks of alcohol   Drug use: No   Sexual activity: Not Currently  Other Topics Concern   Not on file  Social History Narrative   04/24/2013 [name] was born and grew up in [place]. She reports that her childhood was "tough." She has 2 older sisters and a younger brother. She achieved an Associates Degree in nursing. She has been working as an Charity fundraiser for 40 years. She is separated from her husband for 6 years. She has 2 daughters and one son. She denies any legal difficulties. She affiliates as Curator. Her hobbies include reading, sewing, crafts, and cooking. She reports that her social support system consists of her friend and passed her. 04/24/2013 [name]      Lives alone.   Right-handed.   No caffeine use.   Social Determinants of Health   Financial Resource Strain: Low Risk  (09/10/2022)   Overall Financial Resource Strain (CARDIA)    Difficulty of Paying Living Expenses: Not hard at all  Food  Insecurity: No Food Insecurity (09/10/2022)   Hunger Vital Sign    Worried About Running Out of Food in the Last Year: Never true    Ran Out of Food in the Last Year: Never true  Transportation Needs: No Transportation Needs (09/10/2022)   PRAPARE - Administrator, Civil Service (Medical): No    Lack of Transportation (Non-Medical): No  Physical Activity: Sufficiently Active (09/10/2022)   Exercise Vital Sign  Days of Exercise per Week: 3 days    Minutes of Exercise per Session: 60 min  Stress: No Stress Concern Present (09/10/2022)   [employer] of Occupational Health - Occupational Stress Questionnaire    Feeling of Stress : Not at all  Social Connections: Moderately Integrated (09/10/2022)   Social Connection and Isolation Panel [NHANES]    Frequency of Communication with Friends and Family: More than three times a week    Frequency of Social Gatherings with Friends and Family: Three times a week    Attends Religious Services: More than 4 times per year    Active Member of Clubs or Organizations: Yes    Attends Banker Meetings: More than 4 times per year    Marital Status: Separated  Intimate Partner Violence: Not At Risk (09/10/2022)   Humiliation, Afraid, Rape, and Kick questionnaire    Fear of Current or Ex-Partner: No    Emotionally Abused: No    Physically Abused: No    Sexually Abused: No     BP 132/80 (BP Location: Left Arm, Patient Position: Sitting, Cuff Size: Normal)   Pulse 60   Ht 5\' 4"  (1.626 m)   Wt 268 lb 9.6 oz (121.8 kg)   SpO2 93%   BMI 46.11 kg/m   Physical Exam:  Well appearing NAD HEENT: Unremarkable Neck:  No JVD, no thyromegally Lymphatics:  No adenopathy Back:  No CVA tenderness Lungs:  Clear with no wheezes HEART:  Regular rate rhythm, no murmurs, no rubs, no clicks Abd:  soft, positive bowel sounds, no organomegally, no rebound, no guarding Ext:  2 plus pulses, no edema, no cyanosis, no clubbing Skin:  No rashes no  nodules Neuro:  CN II through XII intact, motor grossly intact  DEVICE  Normal device function.  See PaceArt for details.   Assess/Plan:  Obesity - she has lost 20 lbs! She is encouraged to eat less and increase her activity. We discussed consuming more protein and eating fewer carbs. We discussed the newer weight loss meds.  2. Diastolic heart failure - I encouraged her to avoid salty foods and to lose weight. 3. Sinus node dysfunction - she is asymptomatic, s/p PPM insertion. 4. PPM - her Biotronik DDD PM is working normally.   Sharlot Gowda Jolanda Mccann,MD

## 2023-02-10 NOTE — Patient Instructions (Signed)
Medication Instructions:  Your physician recommends that you continue on your current medications as directed. Please refer to the Current Medication list given to you today.   Labwork: None today  Testing/Procedures: None today  Follow-Up: 1 year Dr.Taylor  Any Other Special Instructions Will Be Listed Below (If Applicable).  If you need a refill on your cardiac medications before your next appointment, please call your pharmacy.  

## 2023-02-11 ENCOUNTER — Other Ambulatory Visit (INDEPENDENT_AMBULATORY_CARE_PROVIDER_SITE_OTHER): Payer: Self-pay | Admitting: Internal Medicine

## 2023-02-11 DIAGNOSIS — R7303 Prediabetes: Secondary | ICD-10-CM

## 2023-02-14 DIAGNOSIS — H353211 Exudative age-related macular degeneration, right eye, with active choroidal neovascularization: Secondary | ICD-10-CM | POA: Diagnosis not present

## 2023-02-14 DIAGNOSIS — H34232 Retinal artery branch occlusion, left eye: Secondary | ICD-10-CM | POA: Diagnosis not present

## 2023-02-14 DIAGNOSIS — H3321 Serous retinal detachment, right eye: Secondary | ICD-10-CM | POA: Diagnosis not present

## 2023-02-14 DIAGNOSIS — H35351 Cystoid macular degeneration, right eye: Secondary | ICD-10-CM | POA: Diagnosis not present

## 2023-02-14 DIAGNOSIS — H35071 Retinal telangiectasis, right eye: Secondary | ICD-10-CM | POA: Diagnosis not present

## 2023-02-14 DIAGNOSIS — H353132 Nonexudative age-related macular degeneration, bilateral, intermediate dry stage: Secondary | ICD-10-CM | POA: Diagnosis not present

## 2023-02-14 DIAGNOSIS — H43821 Vitreomacular adhesion, right eye: Secondary | ICD-10-CM | POA: Diagnosis not present

## 2023-02-22 ENCOUNTER — Telehealth (INDEPENDENT_AMBULATORY_CARE_PROVIDER_SITE_OTHER): Payer: PPO | Admitting: Psychology

## 2023-02-22 DIAGNOSIS — F319 Bipolar disorder, unspecified: Secondary | ICD-10-CM | POA: Diagnosis not present

## 2023-02-22 DIAGNOSIS — F419 Anxiety disorder, unspecified: Secondary | ICD-10-CM | POA: Diagnosis not present

## 2023-02-22 DIAGNOSIS — F5089 Other specified eating disorder: Secondary | ICD-10-CM

## 2023-02-22 NOTE — Progress Notes (Signed)
  Office: [phone]  /  Fax: [phone]    Date: February 22, 2023  Appointment Start Time: 11:07am Duration: 22 minutes Provider: Lawerance Cruel, Psy.D. Type of Session: Individual Therapy  Location of Patient: Parked in car outside of house (private location) Location of Provider: Provider's Home (private office) Type of Contact: Telepsychological Visit via MyChart Video Visit  Session Content: This provider called [name] at 11:03am as she did not present for today's appointment. She thought the appointment was later, but noted she could join. As such, today's appointment was initiated 7 minutes late. [name] is a 72 y.o. female presenting for a follow-up appointment to address the previously established treatment goal of increasing coping skills.Today's appointment was a telepsychological visit. [name] provided verbal consent for today's telepsychological appointment and she is aware she is responsible for securing confidentiality on her end of the session. Prior to proceeding with today's appointment, [name]'s physical location at the time of this appointment was obtained as well a phone number she could be reached at in the event of technical difficulties. [name] and this provider participated in today's telepsychological service.   This provider conducted a brief check-in. [name] shared, "I'm doing pretty good" and shared about recent events. She described feeling "mean" to her body. Psychoeducation provided regarding self-compassion. [name] was engaged in a self-compassion exercise to help with eating-related challenges and other ongoing stressors. She was encouraged to regularly ask herself, "What do I need right now?" and "How can I comfort and care for myself in this moment?" Overall, [name] was receptive to today's appointment as evidenced by openness to sharing, responsiveness to feedback, and willingness to work toward increasing self-compassion.  Mental Status Examination:   Appearance: neat Behavior: appropriate to circumstances Mood: sad Affect: mood congruent Speech: WNL Eye Contact: appropriate Psychomotor Activity: WNL Gait: unable to assess Thought Process: linear, logical, and goal directed and denies suicidal, homicidal, and self-harm ideation, plan and intent  Thought Content/Perception: no hallucinations, delusions, bizarre thinking or behavior endorsed or observed Orientation: AAOx4 Memory/Concentration: memory, attention, language, and fund of knowledge intact  Insight: fair Judgment: fair  Interventions:  Conducted a brief chart review Conducted a risk assessment Provided empathic reflections and validation Provided positive reinforcement Employed supportive psychotherapy interventions to facilitate reduced distress and to improve coping skills with identified stressors Psychoeducation provided regarding self-compassion Engaged pt in a self-compassion exercise  DSM-5 Diagnosis(es):  F50.89 Other Specified Feeding or Eating Disorder, Emotional and Binge Eating Behaviors, F31. 9 Unspecified Bipolar and Related Disorder, and F41.9 Unspecified Anxiety Disorder  Treatment Goal & Progress: During the initial appointment with this provider, the following treatment goal was established: increase coping skills. Progress is limited, as [name] has just begun treatment with this provider; however, she is receptive to the interaction and interventions and rapport is being established.   Plan: The next appointment is scheduled for 03/21/2023 at 8am, which will be via MyChart Video Visit. The next session will focus on working towards the established treatment goal. [name] will continue with her primary therapist.

## 2023-02-23 ENCOUNTER — Emergency Department (HOSPITAL_COMMUNITY): Payer: PPO

## 2023-02-23 ENCOUNTER — Encounter: Payer: Self-pay | Admitting: Family Medicine

## 2023-02-23 ENCOUNTER — Other Ambulatory Visit: Payer: Self-pay

## 2023-02-23 ENCOUNTER — Emergency Department (HOSPITAL_COMMUNITY)
Admission: EM | Admit: 2023-02-23 | Discharge: 2023-02-23 | Disposition: A | Discharge status: Home / Self Care | Payer: PPO | Attending: Emergency Medicine | Admitting: Emergency Medicine

## 2023-02-23 ENCOUNTER — Encounter (HOSPITAL_COMMUNITY): Payer: Self-pay

## 2023-02-23 DIAGNOSIS — Z950 Presence of cardiac pacemaker: Secondary | ICD-10-CM | POA: Diagnosis not present

## 2023-02-23 DIAGNOSIS — R0609 Other forms of dyspnea: Secondary | ICD-10-CM | POA: Insufficient documentation

## 2023-02-23 DIAGNOSIS — R0602 Shortness of breath: Secondary | ICD-10-CM | POA: Insufficient documentation

## 2023-02-23 DIAGNOSIS — I509 Heart failure, unspecified: Secondary | ICD-10-CM | POA: Diagnosis not present

## 2023-02-23 DIAGNOSIS — Z7982 Long term (current) use of aspirin: Secondary | ICD-10-CM | POA: Diagnosis not present

## 2023-02-23 DIAGNOSIS — J9811 Atelectasis: Secondary | ICD-10-CM | POA: Diagnosis not present

## 2023-02-23 LAB — CBC WITH DIFFERENTIAL/PLATELET
Abs Immature Granulocytes: 0.03 10*3/uL (ref 0.00–0.07)
Basophils Absolute: 0 10*3/uL (ref 0.0–0.1)
Basophils Relative: 1 %
Eosinophils Absolute: 0.1 10*3/uL (ref 0.0–0.5)
Eosinophils Relative: 2 %
HCT: 42.2 % (ref 36.0–46.0)
Hemoglobin: 13.7 g/dL (ref 12.0–15.0)
Immature Granulocytes: 1 %
Lymphocytes Relative: 29 %
Lymphs Abs: 1.6 10*3/uL (ref 0.7–4.0)
MCH: 31.7 pg (ref 26.0–34.0)
MCHC: 32.5 g/dL (ref 30.0–36.0)
MCV: 97.7 fL (ref 80.0–100.0)
Monocytes Absolute: 0.6 10*3/uL (ref 0.1–1.0)
Monocytes Relative: 10 %
Neutro Abs: 3.3 10*3/uL (ref 1.7–7.7)
Neutrophils Relative %: 57 %
Platelets: 224 10*3/uL (ref 150–400)
RBC: 4.32 MIL/uL (ref 3.87–5.11)
RDW: 13.3 % (ref 11.5–15.5)
WBC: 5.7 10*3/uL (ref 4.0–10.5)
nRBC: 0 % (ref 0.0–0.2)

## 2023-02-23 LAB — BASIC METABOLIC PANEL
Anion gap: 10 (ref 5–15)
BUN: 18 mg/dL (ref 8–23)
CO2: 24 mmol/L (ref 22–32)
Calcium: 10.2 mg/dL (ref 8.9–10.3)
Chloride: 102 mmol/L (ref 98–111)
Creatinine, Ser: 0.79 mg/dL (ref 0.44–1.00)
GFR, Estimated: >60 mL/min (ref >60)
Glucose, Bld: 114 mg/dL — ABNORMAL HIGH (ref 70–99)
Potassium: 4.3 mmol/L (ref 3.5–5.1)
Sodium: 136 mmol/L (ref 135–145)

## 2023-02-23 LAB — TROPONIN I (HIGH SENSITIVITY)
Troponin I (High Sensitivity): 3 ng/L (ref <18)
Troponin I (High Sensitivity): 4 ng/L (ref <18)

## 2023-02-23 LAB — BLOOD GAS, VENOUS
Acid-Base Excess: 3.2 mmol/L — ABNORMAL HIGH (ref 0.0–2.0)
Bicarbonate: 28.5 mmol/L — ABNORMAL HIGH (ref 20.0–28.0)
Drawn by: 51174
O2 Saturation: 62.6 %
Patient temperature: 37.1
pCO2, Ven: 45 mmHg (ref 44–60)
pH, Ven: 7.41 (ref 7.25–7.43)
pO2, Ven: 37 mmHg (ref 32–45)

## 2023-02-23 LAB — BRAIN NATRIURETIC PEPTIDE: B Natriuretic Peptide: 35 pg/mL (ref 0.0–100.0)

## 2023-02-23 LAB — D-DIMER, QUANTITATIVE: D-Dimer, Quant: <0.27 ug{FEU}/mL (ref 0.00–0.50)

## 2023-02-23 MED ORDER — ALBUTEROL SULFATE HFA 108 (90 BASE) MCG/ACT IN AERS
2.0000 | INHALATION_SPRAY | RESPIRATORY_TRACT | Status: DC | PRN
  Administered 2023-02-23: 2 via RESPIRATORY_TRACT
  Filled 2023-02-23: qty 6.7

## 2023-02-23 NOTE — Discharge Instructions (Signed)
You are taking care of you today.  You have been having shortness of breath on walking.  Your workup was very reassuring.  There is no signs of a heart attack or blood clot in your lungs.  You are not in heart failure.  It may be beneficial to follow-up with the lung doctor as well as your primary care doctor.  They may be able to do pulmonary function testing if indicated.

## 2023-02-23 NOTE — ED Provider Notes (Signed)
[place] EMERGENCY DEPARTMENT AT [facility] Provider Note   CSN: [id number] Arrival date & time: 02/23/23  1015     History  Chief Complaint  Patient presents with   Shortness of Breath    [name] is a 72 y.o. female.  He is here for evaluation of 1 week of progressively worsening dyspnea on exertion.  No symptoms at rest.  Was more short of breath this morning with walking a short distance.  No chest pain, no palpitations or syncope.  Not on oxygen at home, has history of CHF, has pacemaker, follows with Dr. Diona Browner.  She denies leg swelling.  No calf pain.  No history of DVT or PE  Shortness of Breath      Home Medications Prior to Admission medications   Medication Sig Start Date End Date Taking? Authorizing Provider  acetaminophen (TYLENOL) 650 MG CR tablet Take 1,300 mg by mouth 2 (two) times daily.    [provider]  ALPRAZolam (XANAX XR) 1 MG 24 hr tablet Take 1 mg by mouth every morning.    [provider]  ALPRAZolam Prudy Feeler) 0.5 MG tablet Take 0.5 mg by mouth daily as needed for anxiety.    [provider]  aspirin EC 81 MG tablet Take 1 tablet (81 mg total) by mouth daily. Swallow whole. 02/04/22   Jonelle Sidle, MD  bevacizumab (AVASTIN) 1.25 mg/0.1 mL SOLN 1.25 mg by Intravitreal route to Surgery. Every 6 weeks.    [provider]  glucosamine-chondroitin 500-400 MG tablet Take 2 tablets by mouth every morning.     [provider]  lamoTRIgine (LAMICTAL) 200 MG tablet Take 400 mg by mouth at bedtime.    [provider]  Lutein 20 MG CAPS Take 20 mg by mouth every morning. 01/28/21   [provider]  Multiple Vitamin (MULTIVITAMIN) tablet Take 1 tablet by mouth daily. Senior    [provider]  QUEtiapine (SEROQUEL) 300 MG tablet Take 600 mg by mouth at bedtime. 05/07/13   Gayland Curry, MD  rosuvastatin (CRESTOR) 10 MG tablet TAKE 1 TABLET(10 MG) BY MOUTH  DAILY Patient taking differently: Take 10 mg by mouth in the morning. 02/02/22   Jonelle Sidle, MD      Allergies    Imipramine and Tricor [fenofibrate]    Review of Systems   Review of Systems  Respiratory:  Positive for shortness of breath.     Physical Exam Updated Vital Signs BP (!) 148/66   Pulse 60   Temp 98.5 F (36.9 C)   Resp 20   Ht 5\' 4"  (1.626 m)   Wt 121.8 kg   SpO2 100%   BMI 46.11 kg/m  Physical Exam Vitals and nursing note reviewed.  Constitutional:      General: She is not in acute distress.    Appearance: She is well-developed.  HENT:     Head: Normocephalic and atraumatic.  Eyes:     Conjunctiva/sclera: Conjunctivae normal.  Cardiovascular:     Rate and Rhythm: Normal rate and regular rhythm.     Heart sounds: No murmur heard. Pulmonary:     Effort: Pulmonary effort is normal. No respiratory distress.     Breath sounds: Normal breath sounds. No decreased breath sounds, wheezing, rhonchi or rales.  Chest:     Chest wall: No deformity or tenderness.  Abdominal:     Palpations: Abdomen is soft.     Tenderness: There is no abdominal tenderness.  Musculoskeletal:        General: No swelling. Normal range of motion.     Cervical back: Neck supple.     Right lower leg: No tenderness. No edema.     Left lower leg: No tenderness. No edema.  Skin:    General: Skin is warm and dry.     Capillary Refill: Capillary refill takes less than 2 seconds.  Neurological:     General: No focal deficit present.     Mental Status: She is alert and oriented to person, place, and time.  Psychiatric:        Mood and Affect: Mood normal.     ED Results / Procedures / Treatments   Labs (all labs ordered are listed, but only abnormal results are displayed) Labs Reviewed  BASIC METABOLIC PANEL - Abnormal; Notable for the following components:      Result Value   Glucose, Bld 114 (*)    All other components within normal limits  BLOOD GAS, VENOUS - Abnormal;  Notable for the following components:   Bicarbonate 28.5 (*)    Acid-Base Excess 3.2 (*)    All other components within normal limits  BRAIN NATRIURETIC PEPTIDE  CBC WITH DIFFERENTIAL/PLATELET  D-DIMER, QUANTITATIVE  TROPONIN I (HIGH SENSITIVITY)  TROPONIN I (HIGH SENSITIVITY)    EKG None  Radiology DG Chest 2 View  Result Date: 02/23/2023 CLINICAL DATA:  shortness of breath EXAM: CHEST - 2 VIEW COMPARISON:  CXR 01/31/23 FINDINGS: Left-sided dual lead cardiac device in place with unchanged lead positioning. No pleural effusion. No pneumothorax. Bibasilar atelectasis. No radiographically apparent displaced rib fractures. Visualized upper abdomen is unremarkable. IMPRESSION: Bibasilar atelectasis Electronically Signed   By: Lorenza Cambridge M.D.   On: 02/23/2023 12:02    Procedures Procedures    Medications Ordered in ED Medications  albuterol (VENTOLIN HFA) 108 (90 Base) MCG/ACT inhaler 2 puff (2 puffs Inhalation Given 02/23/23 1705)    ED Course/ Medical Decision Making/ A&P                                 Medical Decision Making DDx: Pneumonia, CHF exacerbation, ACS, PE, effusion, pneumothorax, other ED course: Patient has been having worsening dyspnea on exertion for the past .  It is only when walking, she is able to lay flat in bed and sleep, she has had this in the past.  Troponin negative, EKG reassuring, chest x-ray shows no pulmonary edema or infiltrate, no pleural effusion no pneumothorax.  Normal BNP and no clinical signs of fluid overload.  She is not a smoker and has no history of COPD or asthma.  She is able to ambulate and maintain saturation at 95% on room air, she is not having any chest pain or dizziness or sweating.  Dimer ruled her out for she has had multiple visits in the past for dyspnea on exertion as well.  Discussed follow-up with PCP as well as pulmonology for possible PFTs.  She does note that she thinks anxiety may be contributing, states she takes Xanax at  home.   Amount and/or Complexity of Data Reviewed Labs: ordered. Radiology: ordered.  Risk Prescription drug management.           Final Clinical Impression(s) / ED Diagnoses Final diagnoses:  Dyspnea on exertion    Rx / DC Orders ED Discharge Orders     None         [place],  Elray Mcgregor, PA-C 02/23/23 2019    Bethann Berkshire, MD 02/25/23 1008

## 2023-02-23 NOTE — ED Provider Triage Note (Signed)
Emergency Medicine Provider Triage Evaluation Note  Angelo Durden , a 72 y.o. female  was evaluated in triage.  Pt complains of DOE X 1 week, worse this morning.  Review of Systems  Positive: DOE Negative: Chest pain  Physical Exam  BP (!) 141/69 (BP Location: Right Arm)   Pulse 69   Temp 98.7 F (37.1 C) (Oral)   Resp 18   Ht 5\' 4"  (1.626 m)   Wt 121.8 kg   SpO2 96%   BMI 46.11 kg/m  Gen:   Awake, no distress   Resp:  Normal effort  MSK:   Moves extremities without difficulty  Other:  Lungs CTA bilaterally  Medical Decision Making  Medically screening exam initiated at 1:14 PM.  Appropriate orders placed.  Harsha Maloy was informed that the remainder of the evaluation will be completed by another provider, this initial triage assessment does not replace that evaluation, and the importance of remaining in the ED until their evaluation is complete.     Ma Rings, [place] 02/23/23 1314

## 2023-02-23 NOTE — ED Notes (Signed)
Ambulated patient around nurses station x2 with HR 104 and spo2 95%.

## 2023-02-23 NOTE — ED Notes (Signed)
Pt told ED sec she couldn't breathe- this nurse to assess, O2 sats on dinamap read 99% on ROOM AIR. Pt has forceful expiratory respirations, no tachypnea, labored breathing, pt placed on 0.25 L O2 for comfort ONLY. Respiratory therapist called to come and administer alb inhaler with spacer and teaching on use.

## 2023-02-23 NOTE — ED Triage Notes (Signed)
Pt comes in with SOB starting this morning. Pt states hx of CHF and has a pacemaker. Pt states having a rescue inhaler at home but it is expired and has not had it refilled.

## 2023-02-24 ENCOUNTER — Other Ambulatory Visit: Payer: Self-pay

## 2023-02-24 DIAGNOSIS — R0609 Other forms of dyspnea: Secondary | ICD-10-CM

## 2023-02-24 NOTE — Telephone Encounter (Signed)
I would recommend doing a follow-up visit early next week with [place] that way we could have a face-to-face discussion along with listening to the lungs and reviewing test together and answering her questions thank you  May go ahead with pulmonology referral but patient does not want to see Dr. Alford Highland quite sure who else is on her insurance approved list

## 2023-03-02 ENCOUNTER — Ambulatory Visit: Payer: PPO | Admitting: Family Medicine

## 2023-03-02 ENCOUNTER — Telehealth: Payer: Self-pay

## 2023-03-02 NOTE — Telephone Encounter (Signed)
Transition Care Management Unsuccessful Follow-up Telephone Call  Date of discharge and from where:  02/23/2023 [facility]  Attempts:  2nd Attempt  Reason for unsuccessful TCM follow-up call:  Left voice message  Sofia Jaquith [facility]  [facility] Population Health Community Resource Care Guide   [email or web address]  ?? [phone]   Website: [email or web address]  [email or web address]

## 2023-03-02 NOTE — Telephone Encounter (Signed)
Transition Care Management Unsuccessful Follow-up Telephone Call  Date of discharge and from where:  02/23/2023 [facility]  Attempts:  1st Attempt  Reason for unsuccessful TCM follow-up call:  No answer/busy  Zamora Colton [facility]  [facility] Population Health Community Resource Care Guide   [email or web address]  ?? [phone]   Website: [email or web address]  [email or web address]

## 2023-03-03 ENCOUNTER — Ambulatory Visit (INDEPENDENT_AMBULATORY_CARE_PROVIDER_SITE_OTHER): Payer: PPO | Admitting: Family Medicine

## 2023-03-03 ENCOUNTER — Encounter (INDEPENDENT_AMBULATORY_CARE_PROVIDER_SITE_OTHER): Payer: Self-pay | Admitting: Family Medicine

## 2023-03-03 VITALS — BP 124/81 | HR 66 | Temp 97.4°F | Ht 64.0 in | Wt 262.0 lb

## 2023-03-03 DIAGNOSIS — E669 Obesity, unspecified: Secondary | ICD-10-CM

## 2023-03-03 DIAGNOSIS — Z6841 Body Mass Index (BMI) 40.0 and over, adult: Secondary | ICD-10-CM | POA: Diagnosis not present

## 2023-03-03 DIAGNOSIS — E7849 Other hyperlipidemia: Secondary | ICD-10-CM | POA: Diagnosis not present

## 2023-03-03 NOTE — Progress Notes (Signed)
Chief Complaint:   OBESITY [name] is here to discuss her progress with her obesity treatment plan along with follow-up of her obesity related diagnoses. [name] is on keeping a food journal and adhering to recommended goals of 1200-1300 calories and 85+ grams of protein and states she is following her eating plan approximately 65-70% of the time. [name]  Today's visit was #: 5 Starting weight: 262 lbs Starting date: 12/08/2022 Today's weight: 262 lbs Today's date: 03/03/2023 Total lbs lost to date: 0 Total lbs lost since last in-office visit: 0  Interim History: Since last appointment she has been journaling and she is noticing certain trends and habits.  She finds that after eating lunch and exercising she desires something sweet and then if she indulges she feels tired and has to take a nap.  She went to the ER last week for shortness of breath and was told it may be asthma and was referred to a pulmonologist.   Subjective:   1. Other hyperlipidemia Patient is on Crestor 10 mg daily with no side effects noted.  Assessment/Plan:   1. Other hyperlipidemia Patient will continue Crestor, and she was shown how to write a grid for food log to focus on protein and calories.  2. BMI 45.0-49.9, adult (HCC)  3. Obesity with starting BMI of 45.0 [place] is currently in the action stage of change. As such, her goal is to continue with weight loss efforts. She has agreed to keeping a food journal and adhering to recommended goals of 1200-1300 calories and 85+ grams of protein daily.   Exercise goals: As is.   Behavioral modification strategies: increasing lean protein intake, meal planning and cooking strategies, keeping healthy foods in the home, and planning for success.  [name] has agreed to follow-up with our clinic in 4 weeks. She was informed of the importance of frequent follow-up visits to maximize her success with  intensive lifestyle modifications for her multiple health conditions.   Objective:   Blood pressure 124/81, pulse 66, temperature (!) 97.4 F (36.3 C), height 5\' 4"  (1.626 m), weight 262 lb (118.8 kg), SpO2 98%. Body mass index is 44.97 kg/m.  General: Cooperative, alert, well developed, in no acute distress. HEENT: Conjunctivae and lids unremarkable. Cardiovascular: Regular rhythm.  Lungs: Normal work of breathing. Neurologic: No focal deficits.   Lab Results  Component Value Date   CREATININE 0.79 02/23/2023   BUN 18 02/23/2023   NA 136 02/23/2023   K 4.3 02/23/2023   CL 102 02/23/2023   CO2 24 02/23/2023   Lab Results  Component Value Date   ALT 24 01/31/2023   AST 24 01/31/2023   ALKPHOS 95 01/31/2023   BILITOT 0.5 01/31/2023   Lab Results  Component Value Date   HGBA1C 6.1 (H) 12/08/2022   HGBA1C 6.1 (H) 08/31/2022   HGBA1C 5.9 (H) 02/08/2022   HGBA1C 6.0 (H) 08/28/2021   HGBA1C 5.9 (H) 02/05/2021   Lab Results  Component Value Date   INSULIN 9.0 12/08/2022   Lab Results  Component Value Date   TSH 1.900 12/08/2022   Lab Results  Component Value Date   CHOL 111 12/08/2022   HDL 44 12/08/2022   LDLCALC 41 12/08/2022   TRIG 155 (H) 12/08/2022   CHOLHDL 2.8 08/31/2022   Lab Results  Component Value Date   VD25OH 80.8 12/08/2022   VD25OH 52.0 12/21/2018   VD25OH 32 08/22/2014  Lab Results  Component Value Date   WBC 5.7 02/23/2023   HGB 13.7 02/23/2023   HCT 42.2 02/23/2023   MCV 97.7 02/23/2023   PLT 224 02/23/2023   No results found for: "IRON", "TIBC", "FERRITIN"  Attestation Statements:   Reviewed by clinician on day of visit: allergies, medications, problem list, medical history, surgical history, family history, social history, and previous encounter notes.   I, Burt Knack, am acting as transcriptionist for Reuben Likes, MD.  I have reviewed the above documentation for accuracy and completeness, and I agree with the above.  - Reuben Likes, MD

## 2023-03-09 ENCOUNTER — Ambulatory Visit (INDEPENDENT_AMBULATORY_CARE_PROVIDER_SITE_OTHER): Payer: PPO | Admitting: Family Medicine

## 2023-03-09 ENCOUNTER — Encounter: Payer: Self-pay | Admitting: Family Medicine

## 2023-03-09 VITALS — BP 137/84 | HR 64 | Temp 98.1°F | Wt 268.8 lb

## 2023-03-09 DIAGNOSIS — R0609 Other forms of dyspnea: Secondary | ICD-10-CM | POA: Diagnosis not present

## 2023-03-09 NOTE — Progress Notes (Signed)
   Subjective:    Patient ID: [name], female    DOB: Sep 14, 1950, 72 y.o.   MRN: [id number]  HPI Patient following up on recent ER visit for SOB on 02/23/2023. Patient has been having SOB with exertion. Patient is Short of breath with movement only. Patient is feeling more tired then usual ever since her shortness of breath started. Unable to tolerate exercise as usual.  Patient was at ER with shortness of breath She is concerned about the possibility of underlying heart and lung issues She gets short of breath relatively easily She is battling with a fair amount of chronic diseases which makes it hard for her to exercise on a regular basis Denies PND orthopnea  Review of Systems     Objective:   Physical Exam  General-in no acute distress Eyes-no discharge Lungs-respiratory rate normal, CTA CV-no murmurs,RRR Extremities skin warm dry no edema Neuro grossly normal Behavior normal, alert       Assessment & Plan:  1. DOE (dyspnea on exertion) Based on the findings we need to rule out the possibility of diastolic dysfunction echo ordered Also need pulmonary function test rule out pulmonary Recent lab work reviewed no additional lab work necessary Blood pressure was checked with her cuff and our cuff and both are in a good range - ECHOCARDIOGRAM COMPLETE; Future - Pulmonary function test; Future Lab work from the ER was reviewed in detail

## 2023-03-17 ENCOUNTER — Ambulatory Visit (HOSPITAL_COMMUNITY)
Admission: RE | Admit: 2023-03-17 | Discharge: 2023-03-17 | Disposition: A | Discharge status: Home / Self Care | Payer: PPO | Source: Ambulatory Visit | Attending: Family Medicine | Admitting: Family Medicine

## 2023-03-17 DIAGNOSIS — R0609 Other forms of dyspnea: Secondary | ICD-10-CM

## 2023-03-17 DIAGNOSIS — R0602 Shortness of breath: Secondary | ICD-10-CM | POA: Insufficient documentation

## 2023-03-17 DIAGNOSIS — R079 Chest pain, unspecified: Secondary | ICD-10-CM | POA: Insufficient documentation

## 2023-03-17 DIAGNOSIS — I509 Heart failure, unspecified: Secondary | ICD-10-CM | POA: Insufficient documentation

## 2023-03-17 DIAGNOSIS — I110 Hypertensive heart disease with heart failure: Secondary | ICD-10-CM | POA: Insufficient documentation

## 2023-03-17 DIAGNOSIS — E785 Hyperlipidemia, unspecified: Secondary | ICD-10-CM | POA: Insufficient documentation

## 2023-03-17 DIAGNOSIS — Z950 Presence of cardiac pacemaker: Secondary | ICD-10-CM | POA: Diagnosis not present

## 2023-03-17 DIAGNOSIS — I071 Rheumatic tricuspid insufficiency: Secondary | ICD-10-CM | POA: Insufficient documentation

## 2023-03-18 LAB — ECHOCARDIOGRAM COMPLETE
Area-P 1/2: 3.85 cm2
Calc EF: 61.1 %
S' Lateral: 2.6 cm
Single Plane A2C EF: 55.7 %
Single Plane A4C EF: 64 %

## 2023-03-21 ENCOUNTER — Encounter (INDEPENDENT_AMBULATORY_CARE_PROVIDER_SITE_OTHER): Payer: PPO | Admitting: Psychology

## 2023-03-21 ENCOUNTER — Encounter (INDEPENDENT_AMBULATORY_CARE_PROVIDER_SITE_OTHER): Payer: Self-pay

## 2023-03-21 ENCOUNTER — Telehealth (INDEPENDENT_AMBULATORY_CARE_PROVIDER_SITE_OTHER): Payer: Self-pay | Admitting: Psychology

## 2023-03-21 ENCOUNTER — Encounter: Payer: Self-pay | Admitting: Family Medicine

## 2023-03-21 NOTE — Progress Notes (Signed)
Entered in error

## 2023-03-21 NOTE — Telephone Encounter (Signed)
  Office: [phone]  /  Fax: [phone]  Date of Encounter: March 21, 2023  Time of Encounter: 8:00am Duration of Encounter: ~1.5 minute(s) Provider: Lawerance Cruel, PsyD  CONTENT: [name] presented for today's appointment via MyChart Video Visit, but requested to reschedule as she has COVID-19. She explained she attempted to call and cancel on Friday, but was unable to leave a message. This provider shared she can send MyChart messages in the future. A brief risk assessment was completed. [name] denied experiencing suicidal, self-harm and homicidal ideation, plan, or intent since the last appointment with this provider. All questions/concerns addressed.   PLAN: [name] noted a plan to call the clinic and reschedule today's appointment when she is feeling better. No follow-up planned by this provider.

## 2023-03-21 NOTE — Telephone Encounter (Signed)
Nurses  If she is having any fever chills or significant illness symptoms on the 11th she should cancel the 12th and move it out couple weeks If she is feeling improved by the 11th she may keep the appointment on the 12th  Based on her current symptoms I think it is fine to let it run its course but should things get rough as the week goes on and should she feel she needs to be seen please connect with [place] then talk with me and we could work her in-thank you

## 2023-03-23 ENCOUNTER — Telehealth: Payer: Self-pay

## 2023-03-23 NOTE — Telephone Encounter (Signed)
Called patient and left message for patient to call office. Sent MyChart message.

## 2023-03-24 ENCOUNTER — Encounter (HOSPITAL_COMMUNITY): Payer: PPO

## 2023-03-29 ENCOUNTER — Ambulatory Visit: Payer: PPO | Admitting: Family Medicine

## 2023-03-30 ENCOUNTER — Ambulatory Visit: Payer: PPO | Admitting: Family Medicine

## 2023-03-31 ENCOUNTER — Ambulatory Visit (INDEPENDENT_AMBULATORY_CARE_PROVIDER_SITE_OTHER): Payer: PPO | Admitting: Family Medicine

## 2023-04-01 DIAGNOSIS — F4323 Adjustment disorder with mixed anxiety and depressed mood: Secondary | ICD-10-CM | POA: Diagnosis not present

## 2023-04-07 ENCOUNTER — Ambulatory Visit (HOSPITAL_COMMUNITY)
Admission: RE | Admit: 2023-04-07 | Discharge: 2023-04-07 | Disposition: A | Discharge status: Home / Self Care | Payer: PPO | Source: Ambulatory Visit | Attending: Family Medicine | Admitting: Family Medicine

## 2023-04-07 DIAGNOSIS — R0609 Other forms of dyspnea: Secondary | ICD-10-CM | POA: Insufficient documentation

## 2023-04-07 LAB — PULMONARY FUNCTION TEST
DL/VA % pred: 118 %
DL/VA: 4.91 ml/min/mmHg/L
DLCO unc % pred: 79 %
DLCO unc: 15.44 ml/min/mmHg
FEF 25-75 Post: 1.23 L/sec
FEF 25-75 Pre: 1.98 L/sec
FEF2575-%Change-Post: -37 %
FEF2575-%Pred-Post: 64 %
FEF2575-%Pred-Pre: 104 %
FEV1-%Change-Post: -7 %
FEV1-%Pred-Post: 67 %
FEV1-%Pred-Pre: 73 %
FEV1-Post: 1.54 L
FEV1-Pre: 1.66 L
FEV1FVC-%Change-Post: -4 %
FEV1FVC-%Pred-Pre: 112 %
FEV6-%Change-Post: -4 %
FEV6-%Pred-Post: 64 %
FEV6-%Pred-Pre: 67 %
FEV6-Post: 1.85 L
FEV6-Pre: 1.94 L
FEV6FVC-%Pred-Post: 104 %
FEV6FVC-%Pred-Pre: 104 %
FVC-%Change-Post: -2 %
FVC-%Pred-Post: 62 %
FVC-%Pred-Pre: 64 %
FVC-Post: 1.89 L
FVC-Pre: 1.94 L
Post FEV1/FVC ratio: 82 %
Post FEV6/FVC ratio: 100 %
Pre FEV1/FVC ratio: 85 %
Pre FEV6/FVC Ratio: 100 %
RV % pred: 57 %
RV: 1.26 L
TLC % pred: 75 %
TLC: 3.8 L

## 2023-04-07 MED ORDER — ALBUTEROL SULFATE (2.5 MG/3ML) 0.083% IN NEBU
2.5000 mg | INHALATION_SOLUTION | Freq: Once | RESPIRATORY_TRACT | Status: AC
  Administered 2023-04-07: 2.5 mg via RESPIRATORY_TRACT

## 2023-04-08 ENCOUNTER — Ambulatory Visit (INDEPENDENT_AMBULATORY_CARE_PROVIDER_SITE_OTHER): Payer: PPO

## 2023-04-08 DIAGNOSIS — I495 Sick sinus syndrome: Secondary | ICD-10-CM

## 2023-04-08 LAB — CUP PACEART REMOTE DEVICE CHECK
Date Time Interrogation Session: 20240927081822
Implantable Lead Connection Status: 753985
Implantable Lead Connection Status: 753985
Implantable Lead Implant Date: 20170627
Implantable Lead Implant Date: 20170627
Implantable Lead Location: 753859
Implantable Lead Location: 753860
Implantable Lead Model: 377
Implantable Lead Model: 377
Implantable Lead Serial Number: 49436227
Implantable Lead Serial Number: 49471106
Implantable Pulse Generator Implant Date: 20170627
Pulse Gen Model: 394969
Pulse Gen Serial Number: 68786455

## 2023-04-11 DIAGNOSIS — H35351 Cystoid macular degeneration, right eye: Secondary | ICD-10-CM | POA: Diagnosis not present

## 2023-04-11 DIAGNOSIS — H34232 Retinal artery branch occlusion, left eye: Secondary | ICD-10-CM | POA: Diagnosis not present

## 2023-04-11 DIAGNOSIS — H3321 Serous retinal detachment, right eye: Secondary | ICD-10-CM | POA: Diagnosis not present

## 2023-04-11 DIAGNOSIS — H353211 Exudative age-related macular degeneration, right eye, with active choroidal neovascularization: Secondary | ICD-10-CM | POA: Diagnosis not present

## 2023-04-11 DIAGNOSIS — H35071 Retinal telangiectasis, right eye: Secondary | ICD-10-CM | POA: Diagnosis not present

## 2023-04-11 DIAGNOSIS — H353132 Nonexudative age-related macular degeneration, bilateral, intermediate dry stage: Secondary | ICD-10-CM | POA: Diagnosis not present

## 2023-04-11 DIAGNOSIS — H43821 Vitreomacular adhesion, right eye: Secondary | ICD-10-CM | POA: Diagnosis not present

## 2023-04-12 ENCOUNTER — Other Ambulatory Visit: Payer: Self-pay | Admitting: Cardiology

## 2023-04-13 ENCOUNTER — Encounter: Payer: Self-pay | Admitting: Internal Medicine

## 2023-04-13 ENCOUNTER — Ambulatory Visit: Payer: PPO | Admitting: Internal Medicine

## 2023-04-13 VITALS — BP 128/82 | HR 72 | Ht 64.0 in | Wt 274.0 lb

## 2023-04-13 DIAGNOSIS — R0609 Other forms of dyspnea: Secondary | ICD-10-CM | POA: Diagnosis not present

## 2023-04-13 MED ORDER — PANTOPRAZOLE SODIUM 40 MG PO TBEC: 40.0000 mg | DELAYED_RELEASE_TABLET | Freq: Every day | ORAL | 2 refills | Status: DC

## 2023-04-13 MED ORDER — FAMOTIDINE 20 MG PO TABS
ORAL_TABLET | ORAL | 11 refills | Status: DC
Start: 2023-04-13 — End: 2023-04-19

## 2023-04-13 NOTE — Progress Notes (Signed)
[name], female    DOB: May 07, 1951    MRN: [id number]   Brief patient profile:  72 yowf never smoker healthy child/ adult and healthy IUP x 3 last at age 72 with baseline wt 180 p IUP with new onset summer 2020 noticed a change in singing voice and wheezing and doe x inclines and aggravated with exp to mildew  >> sob/ worse wheeze and resolved when seen next day at [facility] >  rx   albuterol but never used and referred to pulmonary clinic in [place]  03/06/2020 by Dr  Gerda Diss s/p complete cards w/u to include RHC/LHC 70/22/21 with nl pressures and nl coronaries.     History of Present Illness  03/06/2020  Pulmonary/ 1st office eval/Dalene Robards  On lisinopril since 01/21/20  And wt 275 at initial eval  Chief Complaint  Patient presents with   Pulmonary Consult    Referred by Dr. Gerda Diss. Pt c/o DOE and wheezing since May 2021. She states gets winded walking up even a slight incline.   Dyspnea:  [employer] shopping  difficult better since last admit  Cough: noisy breathing with urge to cough if take breath in worse x weeks      Sleep: lie flat cpap per [place] SABA use: has not used inhaler  rec Stop lisinopril  Start telmasartan 40 mg one daily - break in half if too strong Pantoprazole (protonix) 40 mg   Take  30-60 min before first meal of the day and Pepcid (famotidine)  20 mg one after supper  GERD diet    04/17/2020  f/u ov/[place] office/Marta Bouie re: unexplained sob/ wheeze  Did not take acid  Chief Complaint  Patient presents with   Follow-up    Breathing has improved some since the last visit. She has not had to use her albuterol recently.   Dyspnea:  Now doing treadmill 4 days week x 15 min @  ??? Mph/ grade/ also does recumbent x 45 min x ? Speed / distance  Cough: still clearing throat but improved despite no gerd rx/ no candy handy as rec Sleeping:wedge under matress and no cpap until November  SABA use: none  02: none  Rec For throat clearing that bothers you I rec 1st  try pepcid 20 mg after breakfast and supper x one month then call for referral to ENT      04/13/2023  re -establish  ov/[place] office/[facility] Solan re: doe  maint on started pepcid 20 mg p supper   Chief Complaint  Patient presents with   Establish Care   Shortness of Breath  Dyspnea:  x  4 m progressive no longer doing 50 ft with nl sats  30 min of cardio 3 x weekly recumbent bike or step can't go as far as used to gets directed  Cough: just in am's  pepcid 20mg  after supper  Sleeping: wedge and cpap  s  resp cc  SABA use: rx  per ER  02: none       No obvious day to day or daytime variability or assoc excess/ purulent sputum or mucus plugs or hemoptysis or cp or chest tightness, subjective wheeze or overt sinus or hb symptoms.    Also denies any obvious fluctuation of symptoms with weather or environmental changes or other aggravating or alleviating factors except as outlined above   No unusual exposure hx or h/o childhood pna/ asthma or knowledge of premature birth.  Current Allergies, Complete Past Medical History, Past Surgical History, Family  History, and Social History were reviewed in [employer] record.  ROS  The following are not active complaints unless bolded Hoarseness, sore throat, dysphagia, dental problems, itching, sneezing,  nasal congestion or discharge of excess mucus or purulent secretions, ear ache,   fever, chills, sweats, unintended wt loss or wt gain, classically pleuritic or exertional cp,  orthopnea pnd or arm/hand swelling  or leg swelling, presyncope, palpitations, abdominal pain, anorexia, nausea, vomiting, diarrhea  or change in bowel habits or change in bladder habits, change in stools or change in urine, dysuria, hematuria,  rash, arthralgias, visual complaints, headache, numbness, weakness or ataxia or problems with walking or coordination,  change in mood or  memory.        Current Meds  Medication Sig   acetaminophen (TYLENOL)  650 MG CR tablet Take 1,300 mg by mouth 2 (two) times daily.   albuterol (VENTOLIN HFA) 108 (90 Base) MCG/ACT inhaler Inhale into the lungs every 6 (six) hours as needed for wheezing or shortness of breath.   ALPRAZolam (XANAX XR) 1 MG 24 hr tablet Take 1 mg by mouth every morning.   aspirin EC 81 MG tablet Take 1 tablet (81 mg total) by mouth daily. Swallow whole.   bevacizumab (AVASTIN) 1.25 mg/0.1 mL SOLN 1.25 mg by Intravitreal route to Surgery. Every 6 weeks.   glucosamine-chondroitin 500-400 MG tablet Take 2 tablets by mouth every morning.    lamoTRIgine (LAMICTAL) 200 MG tablet Take 400 mg by mouth at bedtime.   Lutein 20 MG CAPS Take 20 mg by mouth every morning.   Multiple Vitamin (MULTIVITAMIN) tablet Take 1 tablet by mouth daily. Senior   QUEtiapine (SEROQUEL) 200 MG tablet Take 400-600 mg by mouth at bedtime.   rosuvastatin (CRESTOR) 10 MG tablet TAKE 1 TABLET(10 MG) BY MOUTH DAILY              Past Medical History:  Diagnosis Date   Anxiety    Bipolar 1 disorder (HCC)    Bradycardia 12/2015   Symptomatic Bradycardia due to sinus node dysfunction   Breast cancer, left breast (HCC) 07/22/2011   Cancer (HCC)    CHF (congestive heart failure) (HCC)    Depression    High cholesterol    Hypertension    IBS (irritable bowel syndrome)    Impaired fasting glucose    Memory loss    OA (osteoarthritis)    Obesity    Personal history of chemotherapy 2009   Personal history of radiation therapy 2009   Polycystic ovarian disease    Prediabetes 02/10/2020   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   Sleep apnea          Objective:    04/13/2023       274    04/17/20 269 lb (122 kg)  03/14/20 273 lb (123.8 kg)  03/06/20 275 lb (124.7 kg)      Vital signs reviewed  04/13/2023  - Note at rest 02 sats  95% on RA   General appearance:    MO (by BMI) somber wf classic voice fatigue    HEENT : Oropharynx  clear      Nasal turbinates nl    NECK :  without   apparent JVD/ palpable Nodes/TM    LUNGS: no acc muscle use,  Nl contour chest which is clear to A and P bilaterally without cough on insp or exp maneuvers   CV:  RRR  no s3 or murmur or increase in P2,  and no edema   ABD:  obese soft and nontender with limited  inspiratory excursion in the supine position. No bruits or organomegaly appreciated   MS:  Nl gait/ ext warm without deformities Or obvious joint restrictions  calf tenderness, cyanosis or clubbing    SKIN: warm and dry without lesions    NEURO:  alert, approp, nl sensorium with  no motor or cerebellar deficits apparent.        I personally reviewed images and agree with radiology impression as follows:  CXR:   pa and lateral cxr Bibasilar atelectasis  My comments:  L >> R but no as dz seen on lateral view          Labs ordered/ reviewed:      Chemistry      Component Value Date/Time   NA 136 02/23/2023 1123   NA 140 08/31/2022 0812   K 4.3 02/23/2023 1123   CL 102 02/23/2023 1123   CO2 24 02/23/2023 1123   BUN 18 02/23/2023 1123   BUN 18 08/31/2022 0812   CREATININE 0.79 02/23/2023 1123   CREATININE 0.80 12/30/2015 1138      Component Value Date/Time   CALCIUM 10.2 02/23/2023 1123   ALKPHOS 95 01/31/2023 1310   AST 24 01/31/2023 1310   ALT 24 01/31/2023 1310   BILITOT 0.5 01/31/2023 1310   BILITOT 0.5 08/31/2022 0812        Lab Results  Component Value Date   WBC 5.7 02/23/2023   HGB 13.7 02/23/2023   HCT 42.2 02/23/2023   MCV 97.7 02/23/2023   PLT 224 02/23/2023     Lab Results  Component Value Date   DDIMER <0.27 02/23/2023      Lab Results  Component Value Date   TSH 1.900 12/08/2022       BNP     35   02/23/23      Assessment

## 2023-04-13 NOTE — Assessment & Plan Note (Addendum)
Onset summer of 2020 assoc with atypical cp/ wheezing - RHC/LHC 01/01/20 with nl pressures and nl coronaries  - placed on ACEi 2016 and again 01/21/20 d/c'd 03/06/2020  And rx for gerd started but never taken  - 04/17/2020 improved to her satisfaction, working out at y now with sustained treadmill and recumbent bike - progressive x 12/2022 > mostly fatigued as of 04/13/2023  - 04/13/2023   Walked on RA  x  2  lap(s) =  approx 300  ft  @ moderate pace, stopped due to doe with lowest 02 sats 95% c/o sob but was not tachypneic, speaking full sentences/ and also limited by fatigue   Allergy screen 04/13/2023 >  Eos 0. /  IgE    Symptoms are markedly disproportionate to objective findings and not clear to what extent this is actually a pulmonary  problem but pt does appear to have difficult to sort out respiratory symptoms of unknown origin for which  DDX  = almost all start with A and  include Adherence, Ace Inhibitors, Acid Reflux, Active Sinus Disease, Alpha 1 Antitripsin deficiency, Anxiety masquerading as Airways dz,  ABPA,  Allergy(esp in young), Aspiration (esp in elderly), Adverse effects of meds,  Active smoking or Vaping, A bunch of PE's/clot burden (a few small clots can't cause this syndrome unless there is already severe underlying pulm or vascular dz with poor reserve),  Anemia or thyroid disorder, plus two Bs  = Bronchiectasis and Beta blocker use..and one C= CHF    All the usual suspects are well covered on present rx x for GERD so rec max gerd rx and f/u in 6 week   ? Asthma/allergy> doubt but check IgE  eos to be complete  ? Anxiety > usually at the bottom of this list of usual suspects but should be much higher on this pt's based on H and P and note already on psychotropics and may interfere with adherence and also interpretation of response or lack thereof to symptom management which can be quite subjective. >>> reconditioning reviewed   Each maintenance medication was reviewed in detail  including emphasizing most importantly the difference between maintenance and prns and under what circumstances the prns are to be triggered using an action plan format where appropriate.  Total time for H and P, chart review, counseling,  directly observing portions of ambulatory 02 saturation study/ and generating customized AVS unique to this office visit / same day charting  > 45 min with pt > 3 y since last eval

## 2023-04-13 NOTE — Patient Instructions (Addendum)
Please remember to go to the lab department   for your tests - we will call you with the results when they are available.      Pantoprazole (protonix) 40 mg   Take  30-60 min before first meal of the day and Pepcid (famotidine)  20 mg after supper until return to office - this is the best way to tell whether stomach acid is contributing to your problem.    GERD (REFLUX)  is an extremely common cause of respiratory symptoms just like yours , many times with no obvious heartburn at all.    It can be treated with medication, but also with lifestyle changes including elevation of the head of your bed (ideally with 6 -8inch blocks under the headboard of your bed),  Smoking cessation, avoidance of late meals, excessive alcohol, and avoid fatty foods, chocolate, peppermint, colas, red wine, and acidic juices such as orange juice.  NO MINT OR MENTHOL PRODUCTS SO NO COUGH DROPS  USE SUGARLESS CANDY INSTEAD (Jolley ranchers or Stover's or Life Savers) or even ice chips will also do - the key is to swallow to prevent all throat clearing. NO OIL BASED VITAMINS - use powdered substitutes.  Avoid fish oil when coughing.    Keep doing exercise 3 x weekly at level where you are slightly short of breath but never out of breath   Please schedule a follow up office visit in 6 weeks, call sooner if needed with all medications /inhalers/ solutions in hand so we can verify exactly what you are taking. This includes all medications from all doctors and over the counters

## 2023-04-13 NOTE — Assessment & Plan Note (Signed)
Difficult for patient to lose weight because of age arthritic issues and psychiatric medications - PFTs 04/07/23  wnl x for disproportionate drop in ERV c/w body habitus   Body mass index is 47.03 kg/m.  -  trending up  Lab Results  Component Value Date   TSH 1.900 12/08/2022    Each maintenance medication was reviewed in detail including emphasizing most importantly the difference between maintenance and prns and under what circumstances the prns are to be triggered using an action plan format where appropriate.  Total time for H and P, chart review, counseling,  directly observing portions of ambulatory 02 saturation study/ and generating customized AVS unique to this office visit / same day charting  >40 min for eval of  refractory respiratory  symptoms of uncertain etiology                 Contributing to doe and risk of GERD >>>   reviewed the need and the process to achieve and maintain neg calorie balance > defer f/u primary care including intermittently monitoring thyroid status

## 2023-04-14 ENCOUNTER — Encounter: Payer: Self-pay | Admitting: Internal Medicine

## 2023-04-14 ENCOUNTER — Telehealth: Payer: Self-pay | Admitting: Internal Medicine

## 2023-04-14 NOTE — Telephone Encounter (Signed)
Called and spoke w/ patient , she  said that  didn't get a clear understanding of her  results of her PFT or she  don't fill like it was addressed. Pt would like more clarification regarding this she said that you can send her My Chart message if you would like

## 2023-04-14 NOTE — Progress Notes (Signed)
Remote pacemaker transmission.   

## 2023-04-14 NOTE — Telephone Encounter (Signed)
Patient would like clarification on her PFT results. Please call and advise [phone]

## 2023-04-15 LAB — CBC WITH DIFFERENTIAL/PLATELET
Basophils Absolute: 0 10*3/uL (ref 0.0–0.2)
Basos: 1 %
EOS (ABSOLUTE): 0.1 10*3/uL (ref 0.0–0.4)
Eos: 2 %
Hematocrit: 40.8 % (ref 34.0–46.6)
Hemoglobin: 13.1 g/dL (ref 11.1–15.9)
Immature Grans (Abs): 0 10*3/uL (ref 0.0–0.1)
Immature Granulocytes: 0 %
Lymphocytes Absolute: 1.7 10*3/uL (ref 0.7–3.1)
Lymphs: 28 %
MCH: 31.7 pg (ref 26.6–33.0)
MCHC: 32.1 g/dL (ref 31.5–35.7)
MCV: 99 fL — ABNORMAL HIGH (ref 79–97)
Monocytes Absolute: 0.7 10*3/uL (ref 0.1–0.9)
Monocytes: 11 %
Neutrophils Absolute: 3.5 10*3/uL (ref 1.4–7.0)
Neutrophils: 58 %
Platelets: 245 10*3/uL (ref 150–450)
RBC: 4.13 x10E6/uL (ref 3.77–5.28)
RDW: 12.9 % (ref 11.7–15.4)
WBC: 6 10*3/uL (ref 3.4–10.8)

## 2023-04-15 LAB — IGE: IgE (Immunoglobulin E), Serum: 46 [IU]/mL (ref 6–495)

## 2023-04-15 LAB — TSH: TSH: 2.58 u[IU]/mL (ref 0.450–4.500)

## 2023-04-19 ENCOUNTER — Encounter: Payer: Self-pay | Admitting: Cardiology

## 2023-04-19 ENCOUNTER — Ambulatory Visit: Payer: PPO | Attending: Cardiology | Admitting: Cardiology

## 2023-04-19 VITALS — BP 128/78 | HR 71 | Ht 64.0 in | Wt 274.2 lb

## 2023-04-19 DIAGNOSIS — R0609 Other forms of dyspnea: Secondary | ICD-10-CM

## 2023-04-19 DIAGNOSIS — I10 Essential (primary) hypertension: Secondary | ICD-10-CM | POA: Diagnosis not present

## 2023-04-19 DIAGNOSIS — Z950 Presence of cardiac pacemaker: Secondary | ICD-10-CM | POA: Diagnosis not present

## 2023-04-19 DIAGNOSIS — I471 Supraventricular tachycardia, unspecified: Secondary | ICD-10-CM | POA: Diagnosis not present

## 2023-04-19 DIAGNOSIS — Z79899 Other long term (current) drug therapy: Secondary | ICD-10-CM | POA: Diagnosis not present

## 2023-04-19 MED ORDER — CHLORTHALIDONE 25 MG PO TABS: 12.5000 mg | ORAL_TABLET | Freq: Every day | ORAL | 3 refills | Status: DC

## 2023-04-19 NOTE — Progress Notes (Signed)
Cardiology Office Note  Date: 04/19/2023   ID: [name], DOB August 17, 1970, MRN [id number]  History of Present Illness: [name] is a 72 y.o. female last seen in June by Ms. Philis Nettle NP, I reviewed the note as well as interval chart.  She continues to report intermittent breathlessness as before, no reproducible exertional chest tightness and no progressive sense of palpitations.  She did see Dr. Sherene Sires for pulmonary assessment, I reviewed the note.  She also had a follow-up echocardiogram in September as noted below showing normal LVEF at 60 to 65% with indeterminate diastolic filling pattern, normal RV contraction and estimated RVSP.  Cardiac monitor from May did indicate atrial and ventricular ectopy with intermittent atrial rhythm and PSVT.  Recommended trial of Toprol-XL which unfortunately she did not tolerate reportedly due to reduction in blood pressure and not feeling as well.  Biotronik pacemaker in place was followed by Dr. Ladona Ridgel.  Recent device check indicated normal function.  She does check her blood pressure periodically at home and generally reports it being in the range of 140/70.  She is presently not on any antihypertensive therapy.  Physical Exam: VS:  BP (!) 140/76 (BP Location: Left Arm, Patient Position: Sitting, Cuff Size: Large)   Pulse 71   Ht 5\' 4"  (1.626 m)   Wt 274 lb 3.2 oz (124.4 kg)   SpO2 98%   BMI 47.07 kg/m , BMI Body mass index is 47.07 kg/m.  Wt Readings from Last 3 Encounters:  04/19/23 274 lb 3.2 oz (124.4 kg)  04/13/23 274 lb (124.3 kg)  03/09/23 268 lb 12.8 oz (121.9 kg)    General: Patient appears comfortable at rest. HEENT: Conjunctiva and lids normal. Neck: Supple, no elevated JVP or carotid bruits. Lungs: Clear to auscultation, nonlabored breathing at rest. Cardiac: Regular rate and rhythm, no S3 or significant systolic murmur.  ECG:  An ECG dated 02/23/2023 was personally reviewed today and demonstrated:  Atrial paced  rhythm.  Labwork: 01/31/2023: ALT 24; AST 24; Magnesium 2.2 02/23/2023: B Natriuretic Peptide 35.0; BUN 18; Creatinine, Ser 0.79; Potassium 4.3; Sodium 136 04/13/2023: Hemoglobin 13.1; Platelets 245; TSH 2.580     Component Value Date/Time   CHOL 111 12/08/2022 0946   TRIG 155 (H) 12/08/2022 0946   HDL 44 12/08/2022 0946   CHOLHDL 2.8 08/31/2022 0812   CHOLHDL 5.0 02/06/2020 0859   VLDL 46 (H) 02/06/2020 0859   LDLCALC 41 12/08/2022 0946   Other Studies Reviewed Today:  Cardiac monitor May 2024: ZIO monitor reviewed.  7 days, 3 hours analyzed.   Predominant rhythm is sinus with heart rate ranging from 61 bpm up to 122 bpm and average heart rate 71 bpm. There does appear to be intermittent atrial and ventricular pacing. There were rare PACs and PVCs representing less than 1% total beats. Episodes of an apparent atrial rhythm or accelerated junctional rhythm as well as PSVT were noted, heart rates ranging from 90 bpm up to 110 bpm.  Faster heart rates in the 120s were mainly associated with a wide QRS and potentially tracking by her ventricular pacemaker. There were no pauses or heart block.  Echocardiogram 03/17/2023:  1. Left ventricular ejection fraction, by estimation, is 60 to 65%. Left  ventricular ejection fraction by 2D MOD biplane is 61.1 %. The left  ventricle has normal function. The left ventricle has no regional wall  motion abnormalities. There is mild left  ventricular hypertrophy. Indeterminate diastolic filling due to E-A  fusion.   2.  Right ventricular systolic function is normal. The right ventricular  size is normal. There is normal pulmonary artery systolic pressure. The  estimated right ventricular systolic pressure is 29.6 mmHg.   3. The mitral valve is grossly normal. No evidence of mitral valve  regurgitation. No evidence of mitral stenosis.   4. The aortic valve is tricuspid. Aortic valve regurgitation is not  visualized. No aortic stenosis is present.   5.  The inferior vena cava is normal in size with greater than 50%  respiratory variability, suggesting right atrial pressure of 3 mmHg.   Assessment and Plan:  1.  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  No progressive symptoms.  She did not tolerate Toprol-XL as discussed above.  For now continue with observation unless symptoms escalate.   2.  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  She follows with Dr. Ladona Ridgel.  Recent device interrogation indicated normal function.   3.  Asymptomatic carotid artery disease, mild bilateral ICA stenosis by carotid Dopplers in March.  She is on aspirin and Crestor.   3.  Primary hypertension.  Stage I hypertension based on most readings.  Recommend trial of chlorthalidone 12.5 mg daily, this may also help with any component of diastolic dysfunction.  Recheck BMET in 2 weeks.   4.  Mixed hyperlipidemia.  LDL 41 in May on Crestor 10 mg daily.  Disposition:  Follow up  6 months.  Signed, Jonelle Sidle, M.D., F.A.C.C. [facility]

## 2023-04-19 NOTE — Patient Instructions (Signed)
Medication Instructions:   START Chlorthalidone 12.5 mg daily  Labwork: BMET in 2 weeks   Testing/Procedures: None today  Follow-Up: 6 months  Any Other Special Instructions Will Be Listed Below (If Applicable).  If you need a refill on your cardiac medications before your next appointment, please call your pharmacy.

## 2023-04-28 ENCOUNTER — Ambulatory Visit (INDEPENDENT_AMBULATORY_CARE_PROVIDER_SITE_OTHER): Payer: PPO | Admitting: Family Medicine

## 2023-05-05 DIAGNOSIS — G4733 Obstructive sleep apnea (adult) (pediatric): Secondary | ICD-10-CM | POA: Diagnosis not present

## 2023-05-12 DIAGNOSIS — F4323 Adjustment disorder with mixed anxiety and depressed mood: Secondary | ICD-10-CM | POA: Diagnosis not present

## 2023-05-19 DIAGNOSIS — Z79899 Other long term (current) drug therapy: Secondary | ICD-10-CM | POA: Diagnosis not present

## 2023-05-20 LAB — BASIC METABOLIC PANEL
BUN/Creatinine Ratio: 22 (ref 12–28)
BUN: 16 mg/dL (ref 8–27)
CO2: 28 mmol/L (ref 20–29)
Calcium: 9.6 mg/dL (ref 8.7–10.3)
Chloride: 99 mmol/L (ref 96–106)
Creatinine, Ser: 0.72 mg/dL (ref 0.57–1.00)
Glucose: 142 mg/dL — ABNORMAL HIGH (ref 70–99)
Potassium: 4.1 mmol/L (ref 3.5–5.2)
Sodium: 142 mmol/L (ref 134–144)
eGFR: 90 mL/min/{1.73_m2} (ref >59)

## 2023-05-24 ENCOUNTER — Telehealth: Payer: Self-pay | Admitting: Cardiology

## 2023-05-24 NOTE — Telephone Encounter (Signed)
Pt c/o medication issue:  1. Name of Medication: chlorthalidone (HYGROTON) 25 MG tablet   2. How are you currently taking this medication (dosage and times per day)?   3. Are you having a reaction (difficulty breathing--STAT)?   4. What is your medication issue? Patient called, although hesitant, and states that she saw her psychiatrist yesterday for anxiousness. Her psychiatrist asked her if she had started a new medication, and then looked online to see this medication can cause anxiety and nervousness. She would like to stop this medication and see what Dr. Diona Browner recommends. Please call back.

## 2023-05-24 NOTE — Telephone Encounter (Signed)
Patient notified and verbalized understanding. Pt stated she will stop Chlorthalidone and call office back Friday if symptoms improve- if symptoms improve we will start HCTZ 12.5 mg once daily per Dr. Diona Browner.

## 2023-05-24 NOTE — Addendum Note (Signed)
Addended by: Roseanne Reno on: 05/24/2023 03:49 PM   Modules accepted: Orders

## 2023-05-26 ENCOUNTER — Ambulatory Visit: Payer: PPO | Admitting: Internal Medicine

## 2023-05-31 ENCOUNTER — Other Ambulatory Visit: Payer: Self-pay | Admitting: Family Medicine

## 2023-05-31 DIAGNOSIS — Z1231 Encounter for screening mammogram for malignant neoplasm of breast: Secondary | ICD-10-CM

## 2023-06-07 DIAGNOSIS — H43821 Vitreomacular adhesion, right eye: Secondary | ICD-10-CM | POA: Diagnosis not present

## 2023-06-07 DIAGNOSIS — H34232 Retinal artery branch occlusion, left eye: Secondary | ICD-10-CM | POA: Diagnosis not present

## 2023-06-07 DIAGNOSIS — H353211 Exudative age-related macular degeneration, right eye, with active choroidal neovascularization: Secondary | ICD-10-CM | POA: Diagnosis not present

## 2023-06-07 DIAGNOSIS — G4733 Obstructive sleep apnea (adult) (pediatric): Secondary | ICD-10-CM | POA: Diagnosis not present

## 2023-06-07 DIAGNOSIS — H3321 Serous retinal detachment, right eye: Secondary | ICD-10-CM | POA: Diagnosis not present

## 2023-06-07 DIAGNOSIS — H35351 Cystoid macular degeneration, right eye: Secondary | ICD-10-CM | POA: Diagnosis not present

## 2023-06-07 DIAGNOSIS — H35071 Retinal telangiectasis, right eye: Secondary | ICD-10-CM | POA: Diagnosis not present

## 2023-06-07 DIAGNOSIS — H353132 Nonexudative age-related macular degeneration, bilateral, intermediate dry stage: Secondary | ICD-10-CM | POA: Diagnosis not present

## 2023-06-07 LAB — HM DIABETES EYE EXAM

## 2023-06-08 ENCOUNTER — Telehealth: Payer: Self-pay

## 2023-06-08 DIAGNOSIS — F4323 Adjustment disorder with mixed anxiety and depressed mood: Secondary | ICD-10-CM | POA: Diagnosis not present

## 2023-06-08 NOTE — Telephone Encounter (Signed)
    Primary Cardiologist: Nona Dell, MD  Chart reviewed as part of pre-operative protocol coverage. Simple dental extractions are considered low risk procedures per guidelines and generally do not require any specific cardiac clearance. It is also generally accepted that for simple extractions and dental cleanings, there is no need to interrupt blood thinner therapy.   SBE prophylaxis is not required for the patient.  I will route this recommendation to the requesting party via Epic fax function and remove from pre-op pool.  Please call with questions.  Ronney Asters, NP 06/08/2023, 2:05 PM

## 2023-06-08 NOTE — Telephone Encounter (Signed)
   Pre-operative Risk Assessment    Patient Name: [name]  DOB: 06-Apr-1951 MRN: [id number]      Request for Surgical Clearance    Procedure:   Possible Root Canal Treatment  Date of Surgery:  Clearance 06/14/23                                 Surgeon:  Beatrix Fetters, DDS Surgeon's Group or Practice Name:  [facility] DDS, [facility] Phone number:  [phone] Fax number:  [phone]   Type of Clearance Requested:   - Medical    Type of Anesthesia:  Local    Additional requests/questions:   procedure will take 1 hour and 15 minutes  SignedMelina Schools   06/08/2023, 7:39 AM

## 2023-06-14 ENCOUNTER — Encounter: Payer: Self-pay | Admitting: Cardiology

## 2023-06-14 ENCOUNTER — Telehealth: Payer: Self-pay | Admitting: Cardiology

## 2023-06-14 ENCOUNTER — Telehealth: Payer: Self-pay | Admitting: *Deleted

## 2023-06-14 NOTE — Telephone Encounter (Signed)
I will forward to pre op the original clearance to addressed that the procedure is actually root canal and not simple extractions.             Tag   Copy Malachi Carl E   Telephone Encounter Signed   Creation Time: 06/14/2023 12:35 PM   Signed     See previous clearance encounter. Marie with Dr. [facility] is following up. She states clearance recommendation has been received, but the procedure patient is having, root canal, is more invasive than simple dental extractions/cleanings. She states they need advisement on holding medication for a root canal specifically. Please advise.

## 2023-06-14 NOTE — Telephone Encounter (Signed)
See previous clearance encounter. [name] with Dr. Elliot Dally[facility] is following up. She states clearance recommendation has been received, but the procedure patient is having, root canal, is more invasive than simple dental extractions/cleanings. She states they need advisement on holding medication for a root canal specifically. Please advise.

## 2023-06-14 NOTE — Telephone Encounter (Signed)
I will forward to pre op the original clearance to addressed that the procedure is actually root canal and not simple extractions.

## 2023-06-14 NOTE — Telephone Encounter (Signed)
I will forward to pre op the original clearance to addressed that the procedure is actually root canal and not simple extractions.             Tag   Copy [name]   Telephone Encounter Signed   Creation Time: 06/14/2023 12:35 PM   Signed     See previous clearance encounter. Marie with Dr. Elliot Dally[facility] is following up. She states clearance recommendation has been received, but the procedure patient is having, root canal, is more invasive than simple dental extractions/cleanings. She states they need advisement on holding medication for a root canal specifically. Please advise.

## 2023-06-14 NOTE — Telephone Encounter (Signed)
   Primary Cardiologist: Nona Dell, MD  Chart reviewed as part of pre-operative protocol coverage. Root canals, simple dental extractions, and cleanings are considered low risk procedures per guidelines and generally do not require any specific cardiac clearance. It is also generally accepted that for root canals, simple extractions and dental cleanings, there is no need to interrupt blood thinner therapy.   SBE prophylaxis is not required for the patient.  I will route this recommendation to the requesting party via Epic fax function and remove from pre-op pool.  Please call with questions.  Levi Aland, NP-C  06/14/2023, 1:56 PM [address] Office [phone] Fax [phone]

## 2023-06-29 ENCOUNTER — Encounter (INDEPENDENT_AMBULATORY_CARE_PROVIDER_SITE_OTHER): Payer: Self-pay | Admitting: *Deleted

## 2023-07-05 ENCOUNTER — Ambulatory Visit (INDEPENDENT_AMBULATORY_CARE_PROVIDER_SITE_OTHER): Payer: PPO | Admitting: Family Medicine

## 2023-07-05 VITALS — BP 122/73 | HR 65 | Temp 97.3°F | Ht 64.0 in | Wt 276.0 lb

## 2023-07-05 DIAGNOSIS — S7002XA Contusion of left hip, initial encounter: Secondary | ICD-10-CM

## 2023-07-05 DIAGNOSIS — R0609 Other forms of dyspnea: Secondary | ICD-10-CM

## 2023-07-05 DIAGNOSIS — S9031XA Contusion of right foot, initial encounter: Secondary | ICD-10-CM

## 2023-07-05 NOTE — Progress Notes (Signed)
   Subjective:    Patient ID: [name], female    DOB: 10-21-1950, 72 y.o.   MRN: [id number]  Discussed the use of AI scribe software for clinical note transcription with the patient, who gave verbal consent to proceed.  History of Present Illness   The patient, with a history of cardiovascular disease, bipolar disorder, and anxiety, presents for a routine check-up and completion of a DMV form. She has a pacemaker implanted since 2018 for sinus node dysfunction and is compliant with her medications. Her cardiovascular condition is well-managed, with a good ejection fraction and no recent hospitalizations.  The patient also has macular degeneration, which is currently being treated with injections. The condition appears to be stabilizing, and there is a possibility of discontinuing the injections. Despite this, the patient reports being able to drive safely, primarily during the day.  In terms of mental health, the patient has been dealing with bipolar disorder and anxiety for approximately 25 years. She is under the care of a psychiatrist and is doing well on her current medications.  The patient also mentions experiencing hip pain, which has been ongoing for a while. The cause and severity of this pain are not further elaborated upon in the conversation.         Review of Systems     Objective:    Physical Exam   CHEST: Breath sounds clear bilaterally without wheezes, rales, or rhonchi. EXTREMITIES: No edema in ankles.     General-in no acute distress Eyes-no discharge Lungs-respiratory rate normal, CTA CV-no murmurs,RRR Extremities skin warm dry no edema Neuro grossly normal Behavior normal, alert       Assessment & Plan:  Assessment and Plan    Sinus Node Dysfunction Pacemaker in place since 2018, no recent issues reported. Ejection fraction 60-65%, indicating good heart function. -Continue current management and regular cardiology follow-ups.  Bipolar Disorder  and Anxiety Stable on current psychiatric medications. -Continue current management and regular psychiatric follow-ups.  Macular Degeneration Undergoing treatment with injections, condition reported as improving and potentially stabilizing. -Continue current management and regular ophthalmology follow-ups.  General Health Maintenance -Recommended to receive second shingles vaccine and annual flu shot at local pharmacy. -Complete and submit DMV medical evaluation form for driving license renewal.  New Complaint - Hip Pain Duration and characteristics not specified in the conversation. -Needs further evaluation.     After careful review of her symptomatology and physical exam and history it is felt that patient can safely operate motor vehicle.  She typically drives local and drives more daytime but she does not necessarily need any restrictions other than being smart about trying to do more daytime driving DMV papers were filled out her eye specialist will fill out the ophthalmologic portion  She also has significant contusion of the left hip and the right foot from a fall no evidence of fracture on physical exam but x-rays were ordered await the results

## 2023-07-08 ENCOUNTER — Ambulatory Visit (HOSPITAL_COMMUNITY)
Admission: RE | Admit: 2023-07-08 | Discharge: 2023-07-08 | Disposition: A | Discharge status: Home / Self Care | Payer: PPO | Source: Ambulatory Visit | Attending: Family Medicine | Admitting: Family Medicine

## 2023-07-08 ENCOUNTER — Ambulatory Visit: Payer: PPO

## 2023-07-08 DIAGNOSIS — M1612 Unilateral primary osteoarthritis, left hip: Secondary | ICD-10-CM | POA: Diagnosis not present

## 2023-07-08 DIAGNOSIS — I878 Other specified disorders of veins: Secondary | ICD-10-CM | POA: Diagnosis not present

## 2023-07-08 DIAGNOSIS — I495 Sick sinus syndrome: Secondary | ICD-10-CM | POA: Diagnosis not present

## 2023-07-08 DIAGNOSIS — S9031XA Contusion of right foot, initial encounter: Secondary | ICD-10-CM | POA: Insufficient documentation

## 2023-07-08 DIAGNOSIS — M79671 Pain in right foot: Secondary | ICD-10-CM | POA: Diagnosis not present

## 2023-07-08 DIAGNOSIS — M19071 Primary osteoarthritis, right ankle and foot: Secondary | ICD-10-CM | POA: Diagnosis not present

## 2023-07-08 DIAGNOSIS — M25552 Pain in left hip: Secondary | ICD-10-CM | POA: Diagnosis not present

## 2023-07-08 DIAGNOSIS — S7002XA Contusion of left hip, initial encounter: Secondary | ICD-10-CM | POA: Diagnosis not present

## 2023-07-08 DIAGNOSIS — M2011 Hallux valgus (acquired), right foot: Secondary | ICD-10-CM | POA: Diagnosis not present

## 2023-07-09 LAB — CUP PACEART REMOTE DEVICE CHECK
Date Time Interrogation Session: 20241227063726
Implantable Lead Connection Status: 753985
Implantable Lead Connection Status: 753985
Implantable Lead Implant Date: 20170627
Implantable Lead Implant Date: 20170627
Implantable Lead Location: 753859
Implantable Lead Location: 753860
Implantable Lead Model: 377
Implantable Lead Model: 377
Implantable Lead Serial Number: 49436227
Implantable Lead Serial Number: 49471106
Implantable Pulse Generator Implant Date: 20170627
Pulse Gen Model: 394969
Pulse Gen Serial Number: 68786455

## 2023-07-12 ENCOUNTER — Ambulatory Visit
Admission: RE | Admit: 2023-07-12 | Discharge: 2023-07-12 | Disposition: A | Discharge status: Home / Self Care | Payer: PPO | Source: Ambulatory Visit | Attending: Family Medicine | Admitting: Family Medicine

## 2023-07-12 DIAGNOSIS — Z1231 Encounter for screening mammogram for malignant neoplasm of breast: Secondary | ICD-10-CM

## 2023-08-02 DIAGNOSIS — H353211 Exudative age-related macular degeneration, right eye, with active choroidal neovascularization: Secondary | ICD-10-CM | POA: Diagnosis not present

## 2023-08-02 DIAGNOSIS — H35351 Cystoid macular degeneration, right eye: Secondary | ICD-10-CM | POA: Diagnosis not present

## 2023-08-02 DIAGNOSIS — H3321 Serous retinal detachment, right eye: Secondary | ICD-10-CM | POA: Diagnosis not present

## 2023-08-02 DIAGNOSIS — H43821 Vitreomacular adhesion, right eye: Secondary | ICD-10-CM | POA: Diagnosis not present

## 2023-08-02 DIAGNOSIS — H34232 Retinal artery branch occlusion, left eye: Secondary | ICD-10-CM | POA: Diagnosis not present

## 2023-08-02 DIAGNOSIS — H353132 Nonexudative age-related macular degeneration, bilateral, intermediate dry stage: Secondary | ICD-10-CM | POA: Diagnosis not present

## 2023-08-02 DIAGNOSIS — H35071 Retinal telangiectasis, right eye: Secondary | ICD-10-CM | POA: Diagnosis not present

## 2023-08-04 DIAGNOSIS — F4323 Adjustment disorder with mixed anxiety and depressed mood: Secondary | ICD-10-CM | POA: Diagnosis not present

## 2023-08-08 DIAGNOSIS — G4733 Obstructive sleep apnea (adult) (pediatric): Secondary | ICD-10-CM | POA: Diagnosis not present

## 2023-08-11 NOTE — Progress Notes (Signed)
Remote pacemaker transmission.

## 2023-08-11 NOTE — Addendum Note (Signed)
Addended by: Elease Etienne A on: 08/11/2023 03:40 PM   Modules accepted: Orders

## 2023-09-16 ENCOUNTER — Ambulatory Visit: Payer: PPO

## 2023-09-22 ENCOUNTER — Other Ambulatory Visit: Payer: Self-pay

## 2023-09-22 ENCOUNTER — Emergency Department (HOSPITAL_COMMUNITY)
Admission: EM | Admit: 2023-09-22 | Discharge: 2023-09-23 | Disposition: A | Discharge status: Home / Self Care | Attending: Emergency Medicine | Admitting: Emergency Medicine

## 2023-09-22 ENCOUNTER — Encounter (HOSPITAL_COMMUNITY): Payer: Self-pay

## 2023-09-22 DIAGNOSIS — R262 Difficulty in walking, not elsewhere classified: Secondary | ICD-10-CM | POA: Diagnosis not present

## 2023-09-22 DIAGNOSIS — R42 Dizziness and giddiness: Secondary | ICD-10-CM | POA: Diagnosis not present

## 2023-09-22 DIAGNOSIS — R55 Syncope and collapse: Secondary | ICD-10-CM | POA: Insufficient documentation

## 2023-09-22 DIAGNOSIS — R531 Weakness: Secondary | ICD-10-CM | POA: Diagnosis not present

## 2023-09-22 LAB — CBC WITH DIFFERENTIAL/PLATELET
Abs Immature Granulocytes: 0.02 10*3/uL (ref 0.00–0.07)
Basophils Absolute: 0 10*3/uL (ref 0.0–0.1)
Basophils Relative: 1 %
Eosinophils Absolute: 0 10*3/uL (ref 0.0–0.5)
Eosinophils Relative: 1 %
HCT: 42 % (ref 36.0–46.0)
Hemoglobin: 13.7 g/dL (ref 12.0–15.0)
Immature Granulocytes: 0 %
Lymphocytes Relative: 31 %
Lymphs Abs: 1.8 10*3/uL (ref 0.7–4.0)
MCH: 31.6 pg (ref 26.0–34.0)
MCHC: 32.6 g/dL (ref 30.0–36.0)
MCV: 97 fL (ref 80.0–100.0)
Monocytes Absolute: 0.5 10*3/uL (ref 0.1–1.0)
Monocytes Relative: 9 %
Neutro Abs: 3.4 10*3/uL (ref 1.7–7.7)
Neutrophils Relative %: 58 %
Platelets: 239 10*3/uL (ref 150–400)
RBC: 4.33 MIL/uL (ref 3.87–5.11)
RDW: 13.2 % (ref 11.5–15.5)
WBC: 5.8 10*3/uL (ref 4.0–10.5)
nRBC: 0 % (ref 0.0–0.2)

## 2023-09-22 LAB — BASIC METABOLIC PANEL
Anion gap: 12 (ref 5–15)
BUN: 15 mg/dL (ref 8–23)
CO2: 30 mmol/L (ref 22–32)
Calcium: 9.9 mg/dL (ref 8.9–10.3)
Chloride: 98 mmol/L (ref 98–111)
Creatinine, Ser: 0.76 mg/dL (ref 0.44–1.00)
GFR, Estimated: >60 mL/min (ref >60)
Glucose, Bld: 112 mg/dL — ABNORMAL HIGH (ref 70–99)
Potassium: 3.8 mmol/L (ref 3.5–5.1)
Sodium: 140 mmol/L (ref 135–145)

## 2023-09-22 LAB — URINALYSIS, W/ REFLEX TO CULTURE (INFECTION SUSPECTED)
Bilirubin Urine: NEGATIVE
Glucose, UA: NEGATIVE mg/dL
Hgb urine dipstick: NEGATIVE
Ketones, ur: NEGATIVE mg/dL
Leukocytes,Ua: NEGATIVE
Nitrite: NEGATIVE
Protein, ur: NEGATIVE mg/dL
Specific Gravity, Urine: 1.004 — ABNORMAL LOW (ref 1.005–1.030)
pH: 7 (ref 5.0–8.0)

## 2023-09-22 MED ORDER — LACTATED RINGERS IV BOLUS
500.0000 mL | Freq: Once | INTRAVENOUS | Status: AC
  Administered 2023-09-22: 500 mL via INTRAVENOUS

## 2023-09-22 NOTE — ED Provider Notes (Signed)
  EMERGENCY DEPARTMENT AT [facility] Provider Note   CSN: [id number] Arrival date & time: 09/22/23  1441     History {Add pertinent medical, surgical, social history, OB history to HPI:1} Chief Complaint  Patient presents with   Dizziness   Weakness    Paul Torpey is a 73 y.o. female.  Around 12 PM today, patient was eating a restaurant and had a sudden feeling of weakness and a "sinking" feeling that is hard to describe. She denies the room was spinning. This has happened before multiple times over the last year, but previously, she was able to walk after the episodes. She felt this time she could not walk due to the feeling. This prompted arrival to ED.  She denies recent fevers, chest pain, SOB, nausea (only during the event), vomiting, bowel changes, motor or sensory changes, or sick contacts.  She did not feel any fluttering in her chest. All of her symptoms are now resolved.  She does note that she had been outside working more since it has been warm, and her urine at times has been dark yellow.   Dizziness Associated symptoms: weakness   Weakness Associated symptoms: dizziness        Home Medications Prior to Admission medications   Medication Sig Start Date End Date Taking? Authorizing Provider  acetaminophen (TYLENOL) 650 MG CR tablet Take 1,300 mg by mouth 2 (two) times daily.    [provider]  albuterol (VENTOLIN HFA) 108 (90 Base) MCG/ACT inhaler Inhale into the lungs every 6 (six) hours as needed for wheezing or shortness of breath.    [provider]  ALPRAZolam (XANAX XR) 1 MG 24 hr tablet Take 1 mg by mouth every morning.    [provider]  aspirin EC 81 MG tablet Take 1 tablet (81 mg total) by mouth daily. Swallow whole. 02/04/22   Jonelle Sidle, MD  bevacizumab (AVASTIN) 1.25 mg/0.1 mL SOLN 1.25 mg by Intravitreal route to Surgery. Every 8 weeks.    [provider]  glucosamine-chondroitin  500-400 MG tablet Take 2 tablets by mouth every morning.     [provider]  lamoTRIgine (LAMICTAL) 200 MG tablet Take 400 mg by mouth at bedtime.    [provider]  Lutein 20 MG CAPS Take 20 mg by mouth every morning. 01/28/21   [provider]  Multiple Vitamin (MULTIVITAMIN) tablet Take 1 tablet by mouth daily. Senior    [provider]  QUEtiapine (SEROQUEL) 200 MG tablet Take 400-600 mg by mouth at bedtime. 02/26/23   [provider]  rosuvastatin (CRESTOR) 10 MG tablet TAKE 1 TABLET(10 MG) BY MOUTH DAILY 04/13/23   Jonelle Sidle, MD      Allergies    Imipramine and Tricor [fenofibrate]    Review of Systems   Review of Systems  Neurological:  Positive for dizziness and weakness.    Physical Exam Updated Vital Signs BP (!) 127/98   Pulse 63   Temp (!) 97.4 F (36.3 C) (Oral)   Resp 16   Ht 5\' 4"  (1.626 m)   Wt 125.2 kg   SpO2 100%   BMI 47.38 kg/m  Physical Exam Constitutional:      General: She is not in acute distress.    Appearance: Normal appearance. She is not ill-appearing.  HENT:     Head: Normocephalic and atraumatic.     Mouth/Throat:     Mouth: Mucous membranes are dry.     Pharynx:  Oropharynx is clear.  Eyes:     Extraocular Movements: Extraocular movements intact.     Conjunctiva/sclera: Conjunctivae normal.  Cardiovascular:     Rate and Rhythm: Normal rate and regular rhythm.     Pulses: Normal pulses.     Heart sounds: Normal heart sounds. No murmur heard. Pulmonary:     Effort: Pulmonary effort is normal. No respiratory distress.     Breath sounds: Normal breath sounds.  Abdominal:     General: Bowel sounds are normal. There is no distension.     Palpations: Abdomen is soft.     Tenderness: There is no abdominal tenderness.  Musculoskeletal:        General: Normal range of motion.  Skin:    General: Skin is warm and dry.     Capillary Refill: Capillary refill takes 2 to 3 seconds.   Neurological:     General: No focal deficit present.     Mental Status: She is alert and oriented to person, place, and time.     Cranial Nerves: No cranial nerve deficit.     Sensory: No sensory deficit.     Motor: No weakness.     Coordination: Coordination normal.  Psychiatric:        Mood and Affect: Mood normal.        Behavior: Behavior normal.        Thought Content: Thought content normal.        Judgment: Judgment normal.     ED Results / Procedures / Treatments   Labs (all labs ordered are listed, but only abnormal results are displayed) Labs Reviewed - No data to display  EKG None  Radiology No results found.  Procedures Procedures  {Document cardiac monitor, telemetry assessment procedure when appropriate:1}  Medications Ordered in ED Medications - No data to display  ED Course/ Medical Decision Making/ A&P   {   Click here for ABCD2, HEART and other calculatorsREFRESH Note before signing :1}                              Medical Decision Making Amount and/or Complexity of Data Reviewed Labs: ordered.   Ms. 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  Differential for presentation includes dehydration/orthostasis, arrhythmia, BPPV, severe hypertension, stroke, and ACS.  Will initially collect EKG, pacemaker interrogation, CBC, BMP, urinalysis.  Will also give 500 mL LR bolus given signs of dehydration on exam and the history of her diastolic HF.  Unlikely to need CT head at this time given resolved symptoms and normal neurologic exam.  EKG largely unchanged from prior.  {Document critical care time when appropriate:1} {Document review of labs and clinical  decision tools ie heart score, Chads2Vasc2 etc:1}  {Document your independent review of radiology images, and any outside records:1} {Document your discussion with family members, caretakers, and with consultants:1} {Document social determinants of health affecting pt's care:1} {Document your decision making why or why not admission, treatments were needed:1} Final Clinical Impression(s) / ED Diagnoses Final diagnoses:  None    Rx / DC Orders ED Discharge Orders     None      Janeal Holmes, MD PGY-2, [facility]

## 2023-09-22 NOTE — ED Notes (Signed)
 Pt's LKW 1400 today

## 2023-09-22 NOTE — ED Triage Notes (Signed)
 Pt [facility] from a resturaunt.  Pt was eating lunch about an hr ago and began feeling weak, lethargic and dizzy affecting ambulation.  Stroke screen negative.  Pt  has pacemaker placed 5 yrs ago.    148/60 HR 66, CBG 130, 96% RA

## 2023-09-22 NOTE — ED Notes (Signed)
 This RN spoke with Phil Dopp, [employer] Rep at [phone]. Requested local representative to re-fax report from device. Provided representative with pts demographics, Primary RN phone number Tobi Bastos [phone], and ED Fax number. Order requested Stat. Rep to reach out to ED RN

## 2023-09-23 NOTE — ED Notes (Signed)
 Received call from [place], [employer] rep - she states nothing abnormal with pacemaker device from interrogation report, no mode switches or high ventricular rates. Will fax report to [phone]

## 2023-09-23 NOTE — Discharge Instructions (Signed)
 Please follow-up with your primary care doctor.  Make sure to hydrate well at home. Return here for new concerns.

## 2023-09-23 NOTE — ED Notes (Signed)
 This RN placed another call to [employer] to check on status of transmission of interrogation. Awaiting call back from rep

## 2023-09-23 NOTE — ED Notes (Signed)
 Ambulated patient to restroom with portable pulse ox, highest hr was 138 and lowest was 115 while ambulating. O2 sats stayed 94-95. Patient stated she felt ok to walk but "wished she felt better than she did"

## 2023-09-23 NOTE — ED Provider Notes (Signed)
  12:42 AM Interrogation without acute abnormalities or arrhythmias.  Ambulatory here.  Appears stable for discharge to follow-up with PCP.  Can return here for new concerns.   Garlon Hatchet, PA-C 09/23/23 0104    Zadie Rhine, MD 09/23/23 [phone]

## 2023-09-23 NOTE — ED Notes (Signed)
Patient verbalizes understanding of discharge instructions. Opportunity for questioning and answers were provided. Armband removed by staff, pt discharged from ED. Ambulated out to lobby with son  

## 2023-09-27 DIAGNOSIS — H35371 Puckering of macula, right eye: Secondary | ICD-10-CM | POA: Diagnosis not present

## 2023-09-27 DIAGNOSIS — H3321 Serous retinal detachment, right eye: Secondary | ICD-10-CM | POA: Diagnosis not present

## 2023-09-27 DIAGNOSIS — H43821 Vitreomacular adhesion, right eye: Secondary | ICD-10-CM | POA: Diagnosis not present

## 2023-09-27 DIAGNOSIS — H34232 Retinal artery branch occlusion, left eye: Secondary | ICD-10-CM | POA: Diagnosis not present

## 2023-09-27 DIAGNOSIS — H35351 Cystoid macular degeneration, right eye: Secondary | ICD-10-CM | POA: Diagnosis not present

## 2023-09-27 DIAGNOSIS — H353132 Nonexudative age-related macular degeneration, bilateral, intermediate dry stage: Secondary | ICD-10-CM | POA: Diagnosis not present

## 2023-09-27 DIAGNOSIS — H353211 Exudative age-related macular degeneration, right eye, with active choroidal neovascularization: Secondary | ICD-10-CM | POA: Diagnosis not present

## 2023-09-27 DIAGNOSIS — H35071 Retinal telangiectasis, right eye: Secondary | ICD-10-CM | POA: Diagnosis not present

## 2023-10-03 ENCOUNTER — Ambulatory Visit (INDEPENDENT_AMBULATORY_CARE_PROVIDER_SITE_OTHER): Admitting: Family Medicine

## 2023-10-03 ENCOUNTER — Encounter: Payer: Self-pay | Admitting: Family Medicine

## 2023-10-03 VITALS — BP 136/85 | HR 71 | Temp 97.3°F | Ht 64.0 in | Wt 283.0 lb

## 2023-10-03 DIAGNOSIS — R55 Syncope and collapse: Secondary | ICD-10-CM | POA: Diagnosis not present

## 2023-10-03 DIAGNOSIS — E7849 Other hyperlipidemia: Secondary | ICD-10-CM | POA: Diagnosis not present

## 2023-10-03 DIAGNOSIS — R42 Dizziness and giddiness: Secondary | ICD-10-CM

## 2023-10-03 DIAGNOSIS — R5383 Other fatigue: Secondary | ICD-10-CM | POA: Diagnosis not present

## 2023-10-03 DIAGNOSIS — R7301 Impaired fasting glucose: Secondary | ICD-10-CM | POA: Diagnosis not present

## 2023-10-03 DIAGNOSIS — Z79899 Other long term (current) drug therapy: Secondary | ICD-10-CM

## 2023-10-03 NOTE — Progress Notes (Signed)
 Subjective:    Patient ID: [name], female    DOB: 1951/07/04, 73 y.o.   MRN: [id number]  HPI ED follow up for weakness and dizziness   Hole in retina- upcoming surgery to repair  Discussed the use of AI scribe software for clinical note transcription with the patient, who gave verbal consent to proceed.  History of Present Illness   [name] is a 73 year old female who presents with dizziness.  She experienced a dizzy spell while with family, describing it as a 'room spinning' sensation. Upon standing, she felt as though she would fall and had to sit back down. An ambulance transported her to the emergency department, where tests were conducted, and no abnormalities were found. She feels unsteady on her feet and more forgetful since her Seroquel dosage was increased to 800 mg following a nervous breakdown in December. She attributes some of her symptoms to the medication, which also causes weight gain.  She has a history of retinal surgery and undergoes routine visits for eye injections every two months. Recently, she noticed her glasses appeared dirtier, which led to the discovery of a hole in her retina. She describes the treatment as involving the removal of a membrane, likened to a piece of tape, followed by flushing. Post-surgery, she was instructed to keep her head down for three days and avoid driving for a week. She is scheduled to receive eye drops and has prepared questions for her follow-up visit.  She has a history of mental health issues, with her psychiatrist visits now occurring every six months. She maintains a diet of beans, rice, and vegetables, despite the weight gain associated with her medication.  She is able to detect changes in her heart rate, which were observed on a monitor during her ER visit. Her cardiologist noted changes in her ventricular function but did not find any severe rhythm issues. She is consistent with her medication regimen, using a  pillbox to organize her doses.  She is due for cholesterol and A1c testing. She maintains a relatively healthy diet but is at risk for weight gain due to medication. Thyroid function needs monitoring due to medication effects and age-related risks.       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 She understood the necessity despite anxiety about the procedure. - Follow post-operative instructions to keep head down for three days. - Avoid driving for one week post-surgery. - Attend follow-up appointment for eye drops.  Dizziness Dizziness episode resolved spontaneously. Likely related to medication side effects or orthostatic hypotension. - Educated on techniques to prevent orthostatic hypotension, such as moving legs before standing.  Bipolar Disorder Bipolar disorder stabilized with Seroquel 800 mg, causing unsteadiness and forgetfulness. Concern about weight gain noted. - Continue current dose of Seroquel. - Discuss potential for future dose adjustment with psychiatrist. - Maintain healthy diet to manage weight.  Cardiac Arrhythmia No significant arrhythmia requiring intervention. Cardiologist advised against increasing heart rate. - Continue regular cardiology follow-ups every six months.  General Health Maintenance Due for cholesterol and A1c testing. At risk for thyroid dysfunction due to age and medication use. - Order cholesterol, A1c, and thyroid function tests  before next appointment with Doctor Diona Browner.      1. Postural dizziness with presyncope (Primary) Patient does have some drop in blood pressure but it is not severe  2. Fasting  hyperglycemia Patient on Seroquel glucoses have been moderately elevated on previous metabolic sevens it is reasonable to check an A1c - Hemoglobin A1c  3. Other hyperlipidemia History of hyperlipidemia check lipid profile - Lipid panel  4. High risk medication use Medication increased risk of hypothyroidism check labs - Hepatic function panel - TSH  5. Other fatigue Check lab work - TSH All well follow-up in 6 months

## 2023-10-07 ENCOUNTER — Ambulatory Visit (INDEPENDENT_AMBULATORY_CARE_PROVIDER_SITE_OTHER): Payer: PPO

## 2023-10-07 DIAGNOSIS — I495 Sick sinus syndrome: Secondary | ICD-10-CM | POA: Diagnosis not present

## 2023-10-07 LAB — CUP PACEART REMOTE DEVICE CHECK
Battery Voltage: 50
Date Time Interrogation Session: 20250328080857
Implantable Lead Connection Status: 753985
Implantable Lead Connection Status: 753985
Implantable Lead Implant Date: 20170627
Implantable Lead Implant Date: 20170627
Implantable Lead Location: 753859
Implantable Lead Location: 753860
Implantable Lead Model: 377
Implantable Lead Model: 377
Implantable Lead Serial Number: 49436227
Implantable Lead Serial Number: 49471106
Implantable Pulse Generator Implant Date: 20170627
Pulse Gen Model: 394969
Pulse Gen Serial Number: 68786455

## 2023-10-12 DIAGNOSIS — H35371 Puckering of macula, right eye: Secondary | ICD-10-CM | POA: Diagnosis not present

## 2023-10-12 DIAGNOSIS — H26491 Other secondary cataract, right eye: Secondary | ICD-10-CM | POA: Diagnosis not present

## 2023-10-14 ENCOUNTER — Encounter: Payer: Self-pay | Admitting: Internal Medicine

## 2023-10-18 DIAGNOSIS — H43821 Vitreomacular adhesion, right eye: Secondary | ICD-10-CM | POA: Diagnosis not present

## 2023-10-18 DIAGNOSIS — H353132 Nonexudative age-related macular degeneration, bilateral, intermediate dry stage: Secondary | ICD-10-CM | POA: Diagnosis not present

## 2023-10-18 DIAGNOSIS — H35371 Puckering of macula, right eye: Secondary | ICD-10-CM | POA: Diagnosis not present

## 2023-10-18 DIAGNOSIS — H35071 Retinal telangiectasis, right eye: Secondary | ICD-10-CM | POA: Diagnosis not present

## 2023-10-18 DIAGNOSIS — H3321 Serous retinal detachment, right eye: Secondary | ICD-10-CM | POA: Diagnosis not present

## 2023-10-18 DIAGNOSIS — H34232 Retinal artery branch occlusion, left eye: Secondary | ICD-10-CM | POA: Diagnosis not present

## 2023-10-18 DIAGNOSIS — H35351 Cystoid macular degeneration, right eye: Secondary | ICD-10-CM | POA: Diagnosis not present

## 2023-10-18 DIAGNOSIS — H353211 Exudative age-related macular degeneration, right eye, with active choroidal neovascularization: Secondary | ICD-10-CM | POA: Diagnosis not present

## 2023-10-19 DIAGNOSIS — Z79899 Other long term (current) drug therapy: Secondary | ICD-10-CM | POA: Diagnosis not present

## 2023-10-19 DIAGNOSIS — R7301 Impaired fasting glucose: Secondary | ICD-10-CM | POA: Diagnosis not present

## 2023-10-19 DIAGNOSIS — R5383 Other fatigue: Secondary | ICD-10-CM | POA: Diagnosis not present

## 2023-10-19 DIAGNOSIS — E7849 Other hyperlipidemia: Secondary | ICD-10-CM | POA: Diagnosis not present

## 2023-10-20 ENCOUNTER — Encounter: Payer: Self-pay | Admitting: Family Medicine

## 2023-10-20 ENCOUNTER — Other Ambulatory Visit: Payer: Self-pay

## 2023-10-20 DIAGNOSIS — E119 Type 2 diabetes mellitus without complications: Secondary | ICD-10-CM

## 2023-10-20 DIAGNOSIS — E7849 Other hyperlipidemia: Secondary | ICD-10-CM

## 2023-10-20 LAB — LIPID PANEL
Chol/HDL Ratio: 2.4 ratio (ref 0.0–4.4)
Cholesterol, Total: 124 mg/dL (ref 100–199)
HDL: 51 mg/dL (ref >39)
LDL Chol Calc (NIH): 44 mg/dL (ref 0–99)
Triglycerides: 177 mg/dL — ABNORMAL HIGH (ref 0–149)
VLDL Cholesterol Cal: 29 mg/dL (ref 5–40)

## 2023-10-20 LAB — HEPATIC FUNCTION PANEL
ALT: 23 IU/L (ref 0–32)
AST: 24 IU/L (ref 0–40)
Albumin: 4.5 g/dL (ref 3.8–4.8)
Alkaline Phosphatase: 114 IU/L (ref 44–121)
Bilirubin Total: 0.3 mg/dL (ref 0.0–1.2)
Bilirubin, Direct: 0.15 mg/dL (ref 0.00–0.40)
Total Protein: 7 g/dL (ref 6.0–8.5)

## 2023-10-20 LAB — TSH: TSH: 1.52 u[IU]/mL (ref 0.450–4.500)

## 2023-10-20 LAB — HEMOGLOBIN A1C
Est. average glucose Bld gHb Est-mCnc: 140 mg/dL
Hgb A1c MFr Bld: 6.5 % — ABNORMAL HIGH (ref 4.8–5.6)

## 2023-10-20 NOTE — Progress Notes (Signed)
 Orders placed and messaged patient per drs orders.

## 2023-10-25 ENCOUNTER — Ambulatory Visit: Payer: PPO | Attending: Cardiology | Admitting: Cardiology

## 2023-10-25 ENCOUNTER — Encounter: Payer: Self-pay | Admitting: Cardiology

## 2023-10-25 VITALS — BP 132/70 | HR 69 | Ht 64.0 in | Wt 281.2 lb

## 2023-10-25 DIAGNOSIS — I495 Sick sinus syndrome: Secondary | ICD-10-CM | POA: Diagnosis not present

## 2023-10-25 DIAGNOSIS — I10 Essential (primary) hypertension: Secondary | ICD-10-CM | POA: Diagnosis not present

## 2023-10-25 DIAGNOSIS — E782 Mixed hyperlipidemia: Secondary | ICD-10-CM | POA: Diagnosis not present

## 2023-10-25 DIAGNOSIS — I471 Supraventricular tachycardia, unspecified: Secondary | ICD-10-CM

## 2023-10-25 MED ORDER — BISOPROLOL FUMARATE 5 MG PO TABS: 2.5000 mg | ORAL_TABLET | Freq: Every day | ORAL | 3 refills | Status: DC

## 2023-10-25 NOTE — Progress Notes (Signed)
    Cardiology Office Note  Date: 10/25/2023   ID: [name], DOB 30-Jun-1951, MRN [id number]  History of Present Illness: [name] is a 73 y.o. female last seen in October 2024.  She is here today for a follow-up visit.  Reports intermittent palpitations as before.  She did have an episode of lightheadedness while she was seated in a restaurant, ultimately seen in the ER without acute findings, no specific arrhythmias.  She has had an atrial rhythm/PSVT noted by previous monitoring, did not tolerate Toprol-XL last year.  We discussed a trial of bisoprolol potentially.  Biotronik pacemaker with follow-up by Dr. Carolynne Citron.  Device interrogation in March indicated normal function.  We went over her medications.  Physical Exam: VS:  BP 132/70   Pulse 69   Ht 5\' 4"  (1.626 m)   Wt 281 lb 3.2 oz (127.6 kg)   SpO2 95%   BMI 48.27 kg/m , BMI Body mass index is 48.27 kg/m.  Wt Readings from Last 3 Encounters:  10/25/23 281 lb 3.2 oz (127.6 kg)  10/03/23 283 lb (128.4 kg)  09/22/23 276 lb 0.3 oz (125.2 kg)    General: Patient appears comfortable at rest. HEENT: Conjunctiva and lids normal. Lungs: Clear to auscultation, nonlabored breathing at rest. Cardiac: Regular rate and rhythm, no S3 or significant systolic murmur.  ECG:  An ECG dated 09/22/2023 was personally reviewed today and demonstrated:  Atrial paced rhythm.  Labwork: 01/31/2023: Magnesium 2.2 02/23/2023: B Natriuretic Peptide 35.0 09/22/2023: BUN 15; Creatinine, Ser 0.76; Hemoglobin 13.7; Platelets 239; Potassium 3.8; Sodium 140 10/19/2023: ALT 23; AST 24; TSH 1.520     Component Value Date/Time   CHOL 124 10/19/2023 1008   TRIG 177 (H) 10/19/2023 1008   HDL 51 10/19/2023 1008   CHOLHDL 2.4 10/19/2023 1008   CHOLHDL 5.0 02/06/2020 0859   VLDL 46 (H) 02/06/2020 0859   LDLCALC 44 10/19/2023 1008   Other Studies Reviewed Today:  No interval cardiac testing for review today.  Assessment and Plan:  1.   Intermittent atrial rhythm as well as PSVT by cardiac monitor in May 2024.  Reports intermittent palpitations, also some episodes of dizziness which could potentially be associated.  Will try bisoprolol 2.5 mg daily for now.   2.  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  She follows with Dr. Carolynne Citron.  Recent device interrogation indicated normal function.   3.  Asymptomatic carotid artery disease, mild bilateral ICA stenosis by carotid Dopplers in March 2024.  Continue aspirin 81 mg daily and Crestor 10 mg daily.  Recent LDL 44.   3.  Primary hypertension.  Tolerating chlorthalidone 12.5 mg daily.   4.  Mixed hyperlipidemia.  Recent LDL 44.  Continue Crestor 10 mg daily.  Disposition:  Follow up  6 months.  Signed, Gerard Knight, M.D., F.A.C.C. [facility]

## 2023-10-25 NOTE — Patient Instructions (Signed)
 Medication Instructions:   START Bisoprolol 2.5 mg daily  Labwork: None today  Testing/Procedures: None today  Follow-Up: 6 months Dr.McDowell  Any Other Special Instructions Will Be Listed Below (If Applicable).  If you need a refill on your cardiac medications before your next appointment, please call your pharmacy.

## 2023-11-02 DIAGNOSIS — F4323 Adjustment disorder with mixed anxiety and depressed mood: Secondary | ICD-10-CM | POA: Diagnosis not present

## 2023-11-09 ENCOUNTER — Ambulatory Visit: Admitting: Nutrition

## 2023-11-14 ENCOUNTER — Encounter: Attending: Family Medicine | Admitting: Nutrition

## 2023-11-14 DIAGNOSIS — I10 Essential (primary) hypertension: Secondary | ICD-10-CM | POA: Diagnosis not present

## 2023-11-14 DIAGNOSIS — I5032 Chronic diastolic (congestive) heart failure: Secondary | ICD-10-CM | POA: Insufficient documentation

## 2023-11-14 DIAGNOSIS — E118 Type 2 diabetes mellitus with unspecified complications: Secondary | ICD-10-CM | POA: Diagnosis not present

## 2023-11-14 NOTE — Progress Notes (Unsigned)
 Medical Nutrition Therapy  Appointment Start time:  1100  Appointment End time:  1200 Primary concerns today: Pre DM, Obesity, HTN  Referral diagnosis: R73.03, E66.01 Preferred learning style: Hands on, auditory. Has vision issues.  Learning readiness: Ready   NUTRITION ASSESSMENT  follow up from a year ago. A1C 6.5% now.[name]"I need to get back on track. I lost 28 lbs in the past eating healthier. I can do it again." Didn't find the [facility] helpful. Prefers [facility] program  Started exercising a little. Ready to get back to meal planning and planned exercise. Wants to reverse her diabetes with lifestyle and not medications as best as possible. Blood sugars in am 120-130's. Bedtime 130's.  Knows she needs to quit eating snacks and late at night. Struggles cooking for herself at times.  Elevated TG's  and VLDL noted.  Anthropometrics  Wt Readings from Last 3 Encounters:  10/25/23 281 lb 3.2 oz (127.6 kg)  10/03/23 283 lb (128.4 kg)  09/22/23 276 lb 0.3 oz (125.2 kg)   Ht Readings from Last 3 Encounters:  10/25/23 5\' 4"  (1.626 m)  10/03/23 5\' 4"  (1.626 m)  09/22/23 5\' 4"  (1.626 m)   There is no height or weight on file to calculate BMI. @BMIFA @ Facility age limit for growth %iles is 20 years. Facility age limit for growth %iles is 20 years.    Clinical Medical Hx: See chart Medications: See chart Labs:     Latest Ref Rng & Units 10/19/2023   10:08 AM 09/22/2023    3:04 PM 05/19/2023   10:33 AM  CMP  Glucose 70 - 99 mg/dL  161  096   BUN 8 - 23 mg/dL  15  16   Creatinine 0.45 - 1.00 mg/dL  4.09  8.11   Sodium 914 - 145 mmol/L  140  142   Potassium 3.5 - 5.1 mmol/L  3.8  4.1   Chloride 98 - 111 mmol/L  98  99   CO2 22 - 32 mmol/L  30  28   Calcium  8.9 - 10.3 mg/dL  9.9  9.6   Total Protein 6.0 - 8.5 g/dL 7.0     Total Bilirubin 0.0 - 1.2 mg/dL 0.3     Alkaline Phos 44 - 121 IU/L 114     AST 0 - 40 IU/L 24     ALT 0 - 32 IU/L 23      Lipid Panel      Component Value Date/Time   CHOL 124 10/19/2023 1008   TRIG 177 (H) 10/19/2023 1008   HDL 51 10/19/2023 1008   CHOLHDL 2.4 10/19/2023 1008   CHOLHDL 5.0 02/06/2020 0859   VLDL 46 (H) 02/06/2020 0859   LDLCALC 44 10/19/2023 1008   LABVLDL 29 10/19/2023 1008   Lab Results  Component Value Date   HGBA1C 6.5 (H) 10/19/2023    Notable Signs/Symptoms: Limited eye sight  Lifestyle & Dietary Hx LIves with her niece. Eats at home and Eats out some.  Estimated daily fluid intake: 64 oz Supplements: MVI,  Sleep: good Stress / self-care: a lot over her eye issues.  Current average weekly physical activity: ADL can't do a lot due to her eye issues.  24-Hr Dietary Recall B) Oatmeal, 1 cup cooked, banana and  yogurt [place] L) Tuna fish sandwich with LF mayo and fruit-apple. Water  3 pm cookies or milkshake D)  Sweets,   Estimated Energy Needs Calories: 1200 Carbohydrate: 135g Protein: 90g Fat: 33g  NUTRITION DIAGNOSIS  NI-1.7 Predicted excessive energy intake As related to Obesity and prediabetes.  As evidenced by A1c 5.8% and BMI 45.   NUTRITION INTERVENTION  Nutrition education (E-1) on the following topics:  Nutrition and Diabetes education provided on My Plate, CHO counting, meal planning, portion sizes, timing of meals, avoiding snacks between meals unless having a low blood sugar, target ranges for A1C and blood sugars, signs/symptoms and treatment of hyper/hypoglycemia, monitoring blood sugars, taking medications as prescribed, benefits of exercising 30 minutes per day and prevention of complications of DM.  Lifestyle Medicine  - Whole Food, Plant Predominant Nutrition is highly recommended: Eat Plenty of vegetables, Mushrooms, fruits, Legumes, Whole Grains, Nuts, seeds in lieu of processed meats, processed snacks/pastries red meat, poultry, eggs.    -It is better to avoid simple carbohydrates including: Cakes, Sweet Desserts, Ice Cream, Soda (diet and regular),  Sweet Tea, Candies, Chips, Cookies, Store Bought Juices, Alcohol in Excess of  1-2 drinks a day, Lemonade,  Artificial Sweeteners, Doughnuts, Coffee Creamers, "Sugar-free" Products, etc, etc.  This is not a complete list.....  Exercise: If you are able: 30 -60 minutes a day ,4 days a week, or 150 minutes a week.  The longer the better.  Combine stretch, strength, and aerobic activities.  If you were told in the past that you have high risk for cardiovascular diseases, you may seek evaluation by your heart doctor prior to initiating moderate to intense exercise programs.    Handouts Provided Include  Lifestyle Medicine   Learning Style & Readiness for Change Teaching method utilized: Visual & Auditory  Demonstrated degree of understanding via: Teach Back  Barriers to learning/adherence to lifestyle change: none  Goals Established by Pt Goals Cut out snacks between meals of cookies, milkshakes or others. Get back to strengthening exercises 3 times per wek. Get back with using personal trainer Look into therapy options Lose 10 lbs in the next 3 months    MONITORING & EVALUATION Dietary intake, weekly physical activity, and weight in 1 month.  Next Steps  Patient is to work on meal planning.[name]

## 2023-11-14 NOTE — Patient Instructions (Addendum)
 Goals Cut out snacks between meals of cookies, milkshakes or others. Get back to strengthening exercises 3 times per wek. Get back with using personal trainer Look into therapy options Lose 10 lbs in the next 3 months

## 2023-11-15 ENCOUNTER — Encounter: Payer: Self-pay | Admitting: Nutrition

## 2023-11-15 NOTE — Progress Notes (Signed)
 Remote pacemaker transmission.

## 2023-11-22 DIAGNOSIS — H353211 Exudative age-related macular degeneration, right eye, with active choroidal neovascularization: Secondary | ICD-10-CM | POA: Diagnosis not present

## 2023-11-22 DIAGNOSIS — H35071 Retinal telangiectasis, right eye: Secondary | ICD-10-CM | POA: Diagnosis not present

## 2023-11-22 DIAGNOSIS — H353132 Nonexudative age-related macular degeneration, bilateral, intermediate dry stage: Secondary | ICD-10-CM | POA: Diagnosis not present

## 2023-11-22 DIAGNOSIS — H33101 Unspecified retinoschisis, right eye: Secondary | ICD-10-CM | POA: Diagnosis not present

## 2023-11-22 DIAGNOSIS — H3321 Serous retinal detachment, right eye: Secondary | ICD-10-CM | POA: Diagnosis not present

## 2023-11-22 DIAGNOSIS — H35351 Cystoid macular degeneration, right eye: Secondary | ICD-10-CM | POA: Diagnosis not present

## 2023-11-25 ENCOUNTER — Ambulatory Visit: Payer: PPO

## 2023-11-25 VITALS — Ht 64.0 in | Wt 282.0 lb

## 2023-11-25 DIAGNOSIS — Z0000 Encounter for general adult medical examination without abnormal findings: Secondary | ICD-10-CM

## 2023-11-25 NOTE — Progress Notes (Signed)
 Subjective:   [name] is a 73 y.o. who presents for a Medicare Wellness preventive visit.  As a reminder, Annual Wellness Visits don't include a physical exam, and some assessments may be limited, especially if this visit is performed virtually. We may recommend an in-person follow-up visit with your provider if needed.  Visit Complete: Virtual I connected with  [name] on 11/25/23 by a audio enabled telemedicine application and verified that I am speaking with the correct person using two identifiers.  Patient Location: Home  Provider Location: Home Office  I discussed the limitations of evaluation and management by telemedicine. The patient expressed understanding and agreed to proceed.  Vital Signs: Because this visit was a virtual/telehealth visit, some criteria may be missing or patient reported. Any vitals not documented were not able to be obtained and vitals that have been documented are patient reported.  VideoDeclined- This patient declined Librarian, academic. Therefore the visit was completed with audio only.  Persons Participating in Visit: Patient.  AWV Questionnaire: No: Patient Medicare AWV questionnaire was not completed prior to this visit.  Cardiac Risk Factors include: advanced age (>46men, >29 women);hypertension     Objective:     Today's Vitals   11/25/23 1135  Weight: 282 lb (127.9 kg)  Height: 5\' 4"  (1.626 m)   Body mass index is 48.41 kg/m.     11/25/2023   11:38 AM 09/22/2023    2:56 PM 02/23/2023   10:45 AM 01/31/2023   12:49 PM 11/20/2022   10:35 AM 09/10/2022   11:40 AM 07/21/2022   11:48 AM  Advanced Directives  Does Patient Have a Medical Advance Directive? No Yes No No No Yes Yes  Type of Furniture conservator/restorer;Living will    Living will;Healthcare Power of [employer] Power of [place];Living will  Does patient want to make changes to medical advance directive?  No  - Patient declined    No - Patient declined No - Patient declined  Copy of Healthcare Power of Attorney in Chart?  No - copy requested, Physician notified    No - copy requested No - copy requested  Would patient like information on creating a medical advance directive? Yes (MAU/Ambulatory/Procedural Areas - Information given)  No - Patient declined  No - Patient declined  No - Patient declined    Current Medications (verified) Outpatient Encounter Medications as of 11/25/2023  Medication Sig   acetaminophen  (TYLENOL ) 650 MG CR tablet Take 1,300 mg by mouth 2 (two) times daily.   ALPRAZolam  (XANAX  XR) 1 MG 24 hr tablet Take 1 mg by mouth in the morning and at bedtime. Taking once a day   ALPRAZolam  (XANAX ) 1 MG tablet Take 1 mg by mouth 2 (two) times daily as needed for anxiety.   aspirin  EC 81 MG tablet Take 1 tablet (81 mg total) by mouth daily. Swallow whole.   bevacizumab  (AVASTIN ) 1.25 mg/0.1 mL SOLN 1.25 mg by Intravitreal route to Surgery. Every 8 weeks.   bisoprolol  (ZEBETA ) 5 MG tablet Take 0.5 tablets (2.5 mg total) by mouth daily.   chlorthalidone  (HYGROTON ) 25 MG tablet Take 12.5 mg by mouth daily.   glucosamine-chondroitin 500-400 MG tablet Take 2 tablets by mouth every morning.    lamoTRIgine  (LAMICTAL ) 200 MG tablet Take 400 mg by mouth at bedtime.   Lutein 20 MG CAPS Take 20 mg by mouth every morning.   Multiple Vitamin (MULTIVITAMIN) tablet Take 1 tablet by mouth daily. Senior  QUEtiapine  (SEROQUEL ) 200 MG tablet Take 800 mg by mouth at bedtime.   rosuvastatin  (CRESTOR ) 10 MG tablet TAKE 1 TABLET(10 MG) BY MOUTH DAILY   No facility-administered encounter medications on file as of 11/25/2023.    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 Carolynne Citron   Sleep apnea    SOB (shortness of breath)    Past Surgical History:  Procedure Laterality Date   ABDOMINAL HYSTERECTOMY     APPENDECTOMY     BREAST BIOPSY Left 2011   BREAST BIOPSY  2008   BREAST LUMPECTOMY Left 2008   CATARACT EXTRACTION     CHOLECYSTECTOMY     COLONOSCOPY N/A 09/13/2013   Procedure: COLONOSCOPY;  Surgeon: Ruby Corporal, MD;  Location: [facility];  Service: Endoscopy;  Laterality: N/A;  730   COLONOSCOPY WITH PROPOFOL  N/A 07/23/2022   Procedure: COLONOSCOPY WITH PROPOFOL ;  Surgeon: Andrea Greening, DO;  Location: [facility];  Service: Endoscopy;  Laterality: N/A;  11:30 am   EP IMPLANTABLE DEVICE N/A 01/06/2016   Procedure: Pacemaker Implant;  Surgeon: Tammie Fall, MD;  Location: [facility] INVASIVE CV LAB;  Service: Cardiovascular;  Laterality: N/A;   ESOPHAGOGASTRODUODENOSCOPY N/A 01/17/2014   Procedure: ESOPHAGOGASTRODUODENOSCOPY (EGD);  Surgeon: Ruby Corporal, MD;  Location: [facility];  Service: Endoscopy;  Laterality: N/A;  245   EYE SURGERY     JOINT REPLACEMENT     >8 yrs ago   POLYPECTOMY  07/23/2022   Procedure: POLYPECTOMY;  Surgeon: Andrea Greening, DO;  Location: [facility];  Service: Endoscopy;;   RIGHT/LEFT HEART CATH AND CORONARY ANGIOGRAPHY N/A 01/01/2020   Procedure: RIGHT/LEFT HEART CATH AND CORONARY ANGIOGRAPHY;  Surgeon: Odie Benne, MD;   Location: [facility] INVASIVE CV LAB;  Service: Cardiovascular;  Laterality: N/A;   TOTAL KNEE ARTHROPLASTY Bilateral right knee   2012, 2007   Family History  Problem Relation Age of Onset   Cancer Mother    Breast cancer Mother    Bipolar disorder Mother    Diabetes Mother    Depression Mother    Obesity Mother    Anxiety disorder Mother    Heart disease Mother    Vision loss Mother    Alcohol abuse Father    Hypertension Father    Alcoholism Father    Bipolar disorder Sister    Diabetes Sister    Heart disease Other    Social History   Socioeconomic History   Marital status:  Married    Spouse name: Not on file   Number of children: 3   Years of education: college   Highest education level: Associate degree: occupational, Scientist, product/process development, or vocational program  Occupational History   Occupation: Charity fundraiser   Occupation: Retired Charity fundraiser  Tobacco Use   Smoking status: Never    Passive exposure: Never   Smokeless tobacco: Never  Vaping Use   Vaping status: Never Used  Substance and Sexual Activity   Alcohol use: No    Alcohol/week: 0.0 standard drinks of alcohol   Drug use: No   Sexual activity: Not Currently  Other Topics Concern   Not on file  Social History Narrative   04/24/2013 [name] Stephenie Einstein was born and grew up in [place] . She reports that her childhood was "tough." She has 2 older sisters and a younger brother. She achieved an Associates Degree in nursing. She has been working as an Charity fundraiser for 40 years. She is separated from her husband for 6 years. She has 2 daughters and one son. She denies any legal difficulties. She affiliates as Curator. Her hobbies include reading, sewing, crafts, and cooking. She reports that her social support system consists of her friend and passed her. 04/24/2013 [name]      Lives alone.   Right-handed.   No caffeine use.   Social Drivers of Corporate investment banker Strain: Low Risk  (11/25/2023)   Overall Financial Resource Strain (CARDIA)     Difficulty of Paying Living Expenses: Not hard at all  Food Insecurity: No Food Insecurity (11/25/2023)   Hunger Vital Sign    Worried About Running Out of Food in the Last Year: Never true    Ran Out of Food in the Last Year: Never true  Transportation Needs: No Transportation Needs (11/25/2023)   PRAPARE - Administrator, Civil Service (Medical): No    Lack of Transportation (Non-Medical): No  Physical Activity: Insufficiently Active (11/25/2023)   Exercise Vital Sign    Days of Exercise per Week: 3 days    Minutes of Exercise per Session: 30 min  Stress: No Stress Concern Present (11/25/2023)   [employer] of Occupational Health - Occupational Stress Questionnaire    Feeling of Stress : Not at all  Social Connections: Moderately Integrated (11/25/2023)   Social Connection and Isolation Panel [NHANES]    Frequency of Communication with Friends and Family: More than three times a week    Frequency of Social Gatherings with Friends and Family: Three times a week    Attends Religious Services: More than 4 times per year    Active Member of Clubs or Organizations: Yes    Attends Engineer, structural: More than 4 times per year    Marital Status: Separated    Tobacco Counseling Counseling given: Not Answered    Clinical Intake:  Pre-visit preparation completed: Yes  Pain : No/denies pain     Diabetes: Yes CBG done?: No Did pt. bring in CBG monitor from home?: No  Lab Results  Component Value Date   HGBA1C 6.5 (H) 10/19/2023   HGBA1C 6.1 (H) 12/08/2022   HGBA1C 6.1 (H) 08/31/2022     How often do you need to have someone help you when you read instructions, pamphlets, or other written materials from your doctor or pharmacy?: 1 - Never  Interpreter Needed?: No  Information entered by :: Seabron Cypress LPN   Activities of Daily Living     11/25/2023   11:38  AM  In your present state of health, do you have any difficulty performing the  following activities:  Hearing? 0  Vision? 0  Difficulty concentrating or making decisions? 0  Walking or climbing stairs? 0  Dressing or bathing? 0  Doing errands, shopping? 0  Preparing Food and eating ? N  Using the Toilet? N  In the past six months, have you accidently leaked urine? N  Do you have problems with loss of bowel control? N  Managing your Medications? N  Managing your Finances? N  Housekeeping or managing your Housekeeping? N    Patient Care Team: Bennet Brasil, MD as PCP - General (Family Medicine) Tammie Fall, MD as PCP - Electrophysiology (Cardiology) Gerard Knight, MD as PCP - Cardiology (Cardiology) Daphine Eagle, MD as Consulting Physician (Psychiatry) Seward Dao Alleen Arbour, MD as Consulting Physician (Ophthalmology)  Indicate any recent Medical Services you may have received from other than [facility] providers in the past year (date may be approximate).     Assessment:    This is a routine wellness examination for [name].  Hearing/Vision screen Hearing Screening - Comments:: Denies hearing difficulties   Vision Screening - Comments:: Wears rx glasses - up to date with routine eye exams with Dr. Seward Dao    Goals Addressed             This Visit's Progress    Remain active and independent   On track      Depression Screen     11/25/2023   11:39 AM 10/03/2023    9:38 AM 07/05/2023   10:16 AM 03/09/2023   11:28 AM 10/13/2022    3:57 PM 09/10/2022   10:08 AM 09/01/2022   10:27 AM  PHQ 2/9 Scores  PHQ - 2 Score 4 4 2 2  0 0 3  PHQ- 9 Score 12 12 6 7   7     Fall Risk     11/25/2023   11:38 AM 03/09/2023   11:27 AM 10/13/2022    3:57 PM 09/01/2022   10:27 AM 03/01/2022    9:56 AM  Fall Risk   Falls in the past year? 0 1 0 1 0  Number falls in past yr: 0 1 0 1 0  Injury with Fall? 0 0 0 1 0  Risk for fall due to : No Fall Risks    No Fall Risks  Follow up Falls prevention discussed;Education provided;Falls evaluation completed    Falls  evaluation completed    MEDICARE RISK AT HOME:  Medicare Risk at Home Any stairs in or around the home?: No If so, are there any without handrails?: No Home free of loose throw rugs in walkways, pet beds, electrical cords, etc?: Yes Adequate lighting in your home to reduce risk of falls?: Yes Life alert?: No Use of a cane, walker or w/c?: No Grab bars in the bathroom?: Yes Shower chair or bench in shower?: No Elevated toilet seat or a handicapped toilet?: Yes  TIMED UP AND GO:  Was the test performed?  No  Cognitive Function: 6CIT completed    12/12/2017    9:38 AM  MMSE - Mini Mental State Exam  Orientation to time 5  Orientation to Place 5  Registration 3  Attention/ Calculation 5  Recall 2  Language- name 2 objects 2  Language- repeat 1  Language- follow 3 step command 3  Language- read & follow direction 1  Write a sentence 1  Copy design 1  Total score  29        11/25/2023   11:38 AM 09/10/2022   11:40 AM  6CIT Screen  What Year? 0 points 0 points  What month? 0 points 0 points  What time? 0 points 0 points  Count back from 20 0 points 0 points  Months in reverse 0 points 0 points  Repeat phrase 0 points 0 points  Total Score 0 points 0 points    Immunizations Immunization History  Administered Date(s) Administered   Influenza, High Dose Seasonal PF 05/19/2018   Influenza,inj,Quad PF,6+ Mos 05/30/2017, 06/09/2021   Influenza-Unspecified 07/21/2016, 06/10/2017, 05/19/2018, 08/08/2020, 05/28/2022   Moderna Sars-Covid-2 Vaccination 08/16/2019, 09/14/2019, 07/14/2020, 06/08/2021   Pneumococcal Conjugate-13 06/19/2018   Pneumococcal Polysaccharide-23 01/15/2020   Td 08/01/2013   Tdap 10/10/2017   Zoster Recombinant(Shingrix ) 10/26/2021    Screening Tests Health Maintenance  Topic Date Due   COVID-19 Vaccine (5 - 2024-25 season) 03/13/2023   Diabetic kidney evaluation - Urine ACR  09/01/2023   Colonoscopy  01/03/2024 (Originally 07/24/2023)   Zoster  Vaccines- Shingrix  (2 of 2) 10/02/2024 (Originally 12/21/2021)   INFLUENZA VACCINE  02/10/2024   Diabetic kidney evaluation - eGFR measurement  09/21/2024   Medicare Annual Wellness (AWV)  11/24/2024   MAMMOGRAM  07/11/2025   DTaP/Tdap/Td (3 - Td or Tdap) 10/11/2027   Pneumonia Vaccine 52+ Years old  Completed   DEXA SCAN  Completed   Hepatitis C Screening  Completed   HPV VACCINES  Aged Out   Meningococcal B Vaccine  Aged Out    Health Maintenance  Health Maintenance Due  Topic Date Due   COVID-19 Vaccine (5 - 2024-25 season) 03/13/2023   Diabetic kidney evaluation - Urine ACR  09/01/2023    Additional Screening:  Vision Screening: Recommended annual ophthalmology exams for early detection of glaucoma and other disorders of the eye.  Dental Screening: Recommended annual dental exams for proper oral hygiene  Community Resource Referral / Chronic Care Management: CRR required this visit?  No   CCM required this visit?  No   Plan:    I have personally reviewed and noted the following in the patient's chart:   Medical and social history Use of alcohol, tobacco or illicit drugs  Current medications and supplements including opioid prescriptions. Patient is not currently taking opioid prescriptions. Functional ability and status Nutritional status Physical activity Advanced directives List of other physicians Hospitalizations, surgeries, and ER visits in previous 12 months Vitals Screenings to include cognitive, depression, and falls Referrals and appointments  In addition, I have reviewed and discussed with patient certain preventive protocols, quality metrics, and best practice recommendations. A written personalized care plan for preventive services as well as general preventive health recommendations were provided to patient.   Seabron Cypress [place], [place]   1/61/0960   After Visit Summary: (MyChart) Due to this being a telephonic visit, the after visit summary  with patients personalized plan was offered to patient via MyChart   Notes: Nothing significant to report at this time.

## 2023-11-25 NOTE — Patient Instructions (Signed)
 [name] , Thank you for taking time out of your busy schedule to complete your Annual Wellness Visit with me. I enjoyed our conversation and look forward to speaking with you again next year. I, as well as your care team,  appreciate your ongoing commitment to your health goals. Please review the following plan we discussed and let me know if I can assist you in the future. Your Game plan/ To Do List    Follow up Visits: Next Medicare AWV with our clinical staff: In 1 year    Have you seen your provider in the last 6 months (3 months if uncontrolled diabetes)? Yes Next Office Visit with your provider: Scheduled for 04/04/24 @ 9:00  Clinician Recommendations:  Aim for 30 minutes of exercise or brisk walking, 6-8 glasses of water , and 5 servings of fruits and vegetables each day.       This is a list of the screening recommended for you and due dates:  Health Maintenance  Topic Date Due   COVID-19 Vaccine (5 - 2024-25 season) 03/13/2023   Yearly kidney health urinalysis for diabetes  09/01/2023   Colon Cancer Screening  01/03/2024*   Zoster (Shingles) Vaccine (2 of 2) 10/02/2024*   Flu Shot  02/10/2024   Yearly kidney function blood test for diabetes  09/21/2024   Medicare Annual Wellness Visit  11/24/2024   Mammogram  07/11/2025   DTaP/Tdap/Td vaccine (3 - Td or Tdap) 10/11/2027   Pneumonia Vaccine  Completed   DEXA scan (bone density measurement)  Completed   Hepatitis C Screening  Completed   HPV Vaccine  Aged Out   Meningitis B Vaccine  Aged Out  *Topic was postponed. The date shown is not the original due date.    Advanced directives: (ACP Link)Information on Advanced Care Planning can be found at [place]  Secretary of [facility] Advance Health Care Directives Advance Health Care Directives. [email or web address]   Advance Care Planning is important because it:  [x]  Makes sure you receive the medical care that is consistent with your values, goals, and preferences  [x]  It provides  guidance to your family and loved ones and reduces their decisional burden about whether or not they are making the right decisions based on your wishes.  Follow the link provided in your after visit summary or read over the paperwork we have mailed to you to help you started getting your Advance Directives in place. If you need assistance in completing these, please reach out to us  so that we can help you!  See attachments for Preventive Care and Fall Prevention Tips.

## 2023-12-13 DIAGNOSIS — H35071 Retinal telangiectasis, right eye: Secondary | ICD-10-CM | POA: Diagnosis not present

## 2023-12-13 DIAGNOSIS — H35371 Puckering of macula, right eye: Secondary | ICD-10-CM | POA: Diagnosis not present

## 2023-12-13 DIAGNOSIS — H35351 Cystoid macular degeneration, right eye: Secondary | ICD-10-CM | POA: Diagnosis not present

## 2023-12-13 DIAGNOSIS — H3321 Serous retinal detachment, right eye: Secondary | ICD-10-CM | POA: Diagnosis not present

## 2023-12-13 DIAGNOSIS — H34232 Retinal artery branch occlusion, left eye: Secondary | ICD-10-CM | POA: Diagnosis not present

## 2023-12-13 DIAGNOSIS — H43821 Vitreomacular adhesion, right eye: Secondary | ICD-10-CM | POA: Diagnosis not present

## 2023-12-13 DIAGNOSIS — H353211 Exudative age-related macular degeneration, right eye, with active choroidal neovascularization: Secondary | ICD-10-CM | POA: Diagnosis not present

## 2023-12-13 DIAGNOSIS — H353132 Nonexudative age-related macular degeneration, bilateral, intermediate dry stage: Secondary | ICD-10-CM | POA: Diagnosis not present

## 2023-12-14 ENCOUNTER — Encounter: Payer: Self-pay | Admitting: Family Medicine

## 2023-12-19 ENCOUNTER — Encounter: Attending: Family Medicine | Admitting: Nutrition

## 2023-12-19 VITALS — Ht 64.0 in | Wt 280.0 lb

## 2023-12-19 DIAGNOSIS — E118 Type 2 diabetes mellitus with unspecified complications: Secondary | ICD-10-CM | POA: Diagnosis not present

## 2023-12-19 DIAGNOSIS — I10 Essential (primary) hypertension: Secondary | ICD-10-CM | POA: Insufficient documentation

## 2023-12-19 DIAGNOSIS — I5032 Chronic diastolic (congestive) heart failure: Secondary | ICD-10-CM | POA: Diagnosis not present

## 2023-12-19 DIAGNOSIS — R7303 Prediabetes: Secondary | ICD-10-CM

## 2023-12-19 NOTE — Patient Instructions (Signed)
 Goals Use MyFitnesspal app. Go back to the [facility] 4 times per week Talk to counselor when available Lose 2 lbs per month Get A1C down to 6%  Cut out snacks between meals of cookies, milkshakes or others. Get back to strengthening exercises 3 times per wek. Get back with using personal trainer Look into therapy options Lose 10 lbs in the next 3 months

## 2023-12-19 NOTE — Progress Notes (Signed)
 Medical Nutrition Therapy  Appointment Start time:  88  Appointment End time:  1216 Primary concerns today: Pre DM, Obesity, HTN  Referral diagnosis: R73.03, E66.01 Preferred learning style: Hands on, auditory. Has vision issues.  Learning readiness: Ready   NUTRITION ASSESSMENT  follow up  Lost 2 lbs.  PCP Dr. Fairy Homer Talked to MD about seroquel  and will start reducing the dose and may find an alternative. She feels this medication is causing her weight gain. Drinking more water .  Not testing blood sugars. A1C 6.5% April 2025. Not on any medications for her DM . Not exercising much. Still eating out and  drinking some milkshakes from cookout. Had 2 hot dogs and milkshake.. Feels very sluggish  and tired usually every day at about 2 pm. May be having reactive hypoglycemia after a highly processed carb meal. Needs to get meter and testing supplies.   Anthropometrics  Wt Readings from Last 3 Encounters:  12/19/23 280 lb (127 kg)  11/25/23 282 lb (127.9 kg)  11/14/23 282 lb (127.9 kg)   Ht Readings from Last 3 Encounters:  12/19/23 5' 4 (1.626 m)  11/25/23 5' 4 (1.626 m)  11/14/23 5' 4 (1.626 m)   Body mass index is 48.06 kg/m. @BMIFA @ Facility age limit for growth %iles is 20 years. Facility age limit for growth %iles is 20 years. Clinical Medical Hx: See chart Medications: See chart Labs:     Latest Ref Rng & Units 10/19/2023   10:08 AM 09/22/2023    3:04 PM 05/19/2023   10:33 AM  CMP  Glucose 70 - 99 mg/dL  960  454   BUN 8 - 23 mg/dL  15  16   Creatinine 0.98 - 1.00 mg/dL  1.19  1.47   Sodium 829 - 145 mmol/L  140  142   Potassium 3.5 - 5.1 mmol/L  3.8  4.1   Chloride 98 - 111 mmol/L  98  99   CO2 22 - 32 mmol/L  30  28   Calcium  8.9 - 10.3 mg/dL  9.9  9.6   Total Protein 6.0 - 8.5 g/dL 7.0     Total Bilirubin 0.0 - 1.2 mg/dL 0.3     Alkaline Phos 44 - 121 IU/L 114     AST 0 - 40 IU/L 24     ALT 0 - 32 IU/L 23      Lipid Panel     Component Value  Date/Time   CHOL 124 10/19/2023 1008   TRIG 177 (H) 10/19/2023 1008   HDL 51 10/19/2023 1008   CHOLHDL 2.4 10/19/2023 1008   CHOLHDL 5.0 02/06/2020 0859   VLDL 46 (H) 02/06/2020 0859   LDLCALC 44 10/19/2023 1008   LABVLDL 29 10/19/2023 1008   Lab Results  Component Value Date   HGBA1C 6.5 (H) 10/19/2023    Notable Signs/Symptoms: Limited eye sight  Lifestyle & Dietary Hx LIves with her niece. Eats at home and Eats out some.  Estimated daily fluid intake: 64 oz Supplements: MVI,  Sleep: good Stress / self-care: a lot over her eye issues.  Current average weekly physical activity: ADL can't do a lot due to her eye issues.  24-Hr Dietary Recall B) Oatmeal, 1 cup cooked, banana or apple. L) Tuna fish sandwich with LF mayo and fruit-apple. Water  3 pm cookies or milkshake Or cookout 2 hotdogs and milkshake. D) Misc. Snacks: Sweets at times.   Estimated Energy Needs Calories: 1200 Carbohydrate: 135g Protein: 90g Fat: 33g   NUTRITION  DIAGNOSIS  NI-1.7 Predicted excessive energy intake As related to Obesity and Diabetes Type 2  As evidenced by A1c 6.5%  and BMI 45.   NUTRITION INTERVENTION  Nutrition education (E-1) on the following topics:  Nutrition and Diabetes education provided on My Plate, CHO counting, meal planning, portion sizes, timing of meals, avoiding snacks between meals unless having a low blood sugar, target ranges for A1C and blood sugars, signs/symptoms and treatment of hyper/hypoglycemia, monitoring blood sugars, taking medications as prescribed, benefits of exercising 30 minutes per day and prevention of complications of DM.  Lifestyle Medicine  - Whole Food, Plant Predominant Nutrition is highly recommended: Eat Plenty of vegetables, Mushrooms, fruits, Legumes, Whole Grains, Nuts, seeds in lieu of processed meats, processed snacks/pastries red meat, poultry, eggs.    -It is better to avoid simple carbohydrates including: Cakes, Sweet Desserts, Ice  Cream, Soda (diet and regular), Sweet Tea, Candies, Chips, Cookies, Store Bought Juices, Alcohol in Excess of  1-2 drinks a day, Lemonade,  Artificial Sweeteners, Doughnuts, Coffee Creamers, Sugar-free Products, etc, etc.  This is not a complete list.....  Exercise: If you are able: 30 -60 minutes a day ,4 days a week, or 150 minutes a week.  The longer the better.  Combine stretch, strength, and aerobic activities.  If you were told in the past that you have high risk for cardiovascular diseases, you may seek evaluation by your heart doctor prior to initiating moderate to intense exercise programs.    Handouts Provided Include  Lifestyle Medicine   Learning Style & Readiness for Change Teaching method utilized: Visual & Auditory  Demonstrated degree of understanding via: Teach Back  Barriers to learning/adherence to lifestyle change: none  Goals Established by Pt Goals Use MyFitnesspal app. Go back to the [facility] 4 times per week Talk to counselor when available Lose 2 lbs per month Get A1C down to 6%  Cut out snacks between meals of cookies, milkshakes or others. Get back to strengthening exercises 3 times per wek. Get back with using personal trainer Look into therapy options Lose 10 lbs in the next 3 months   MONITORING & EVALUATION Dietary intake, weekly physical activity, and weight in 3 month. Needs prescription for testing supplies to test blood sugar twice a day. Next Steps  Patient is to work on meal planning.Aaron Aas

## 2023-12-26 ENCOUNTER — Encounter: Payer: Self-pay | Admitting: Nutrition

## 2023-12-26 DIAGNOSIS — G4733 Obstructive sleep apnea (adult) (pediatric): Secondary | ICD-10-CM | POA: Diagnosis not present

## 2024-01-04 DIAGNOSIS — F4323 Adjustment disorder with mixed anxiety and depressed mood: Secondary | ICD-10-CM | POA: Diagnosis not present

## 2024-01-05 ENCOUNTER — Encounter: Payer: Self-pay | Admitting: Internal Medicine

## 2024-01-05 ENCOUNTER — Ambulatory Visit: Attending: Internal Medicine | Admitting: Internal Medicine

## 2024-01-05 VITALS — BP 140/74 | HR 60 | Ht 64.0 in | Wt 282.0 lb

## 2024-01-05 DIAGNOSIS — I495 Sick sinus syndrome: Secondary | ICD-10-CM

## 2024-01-05 NOTE — Progress Notes (Signed)
 HPI [name] returns today for follow-up.  She is a very pleasant 73 year old retired Engineer, civil (consulting) with a history of sinus node dysfunction status post pacemaker insertion, morbid obesity, hypertension, diastolic heart failure, and acid reflux induced bronchospasm.  She has had occasional palpitations but for the most part her symptoms have been controlled.  She has class II dyspnea which is multifactorial.  She denies chest pain.  She has not had syncope. Her weight is  up 14 more pounds from a year ago.  Allergies  Allergen Reactions   Imipramine Rash   Tricor [Fenofibrate] Other (See Comments)    Pain, aching     Current Outpatient Medications  Medication Sig Dispense Refill   acetaminophen  (TYLENOL ) 650 MG CR tablet Take 1,300 mg by mouth 2 (two) times daily.     ALPRAZolam  (XANAX  XR) 1 MG 24 hr tablet Take 1 mg by mouth in the morning and at bedtime. Taking once a day     ALPRAZolam  (XANAX ) 1 MG tablet Take 1 mg by mouth 2 (two) times daily as needed for anxiety.     aspirin  EC 81 MG tablet Take 1 tablet (81 mg total) by mouth daily. Swallow whole. 90 tablet 3   bevacizumab  (AVASTIN ) 1.25 mg/0.1 mL SOLN 1.25 mg by Intravitreal route to Surgery. Every 8 weeks.     bisoprolol  (ZEBETA ) 5 MG tablet Take 0.5 tablets (2.5 mg total) by mouth daily. 45 tablet 3   chlorthalidone  (HYGROTON ) 25 MG tablet Take 12.5 mg by mouth daily.     Cholecalciferol (VITAMIN D3) 1.25 MG (50000 UT) CAPS Take 1 capsule by mouth once a week.     glucosamine-chondroitin 500-400 MG tablet Take 2 tablets by mouth every morning.      lamoTRIgine  (LAMICTAL ) 200 MG tablet Take 400 mg by mouth at bedtime.     Lutein 20 MG CAPS Take 20 mg by mouth every morning.     Multiple Vitamin (MULTIVITAMIN) tablet Take 1 tablet by mouth daily. Senior     QUEtiapine  (SEROQUEL ) 200 MG tablet Take 800 mg by mouth at bedtime. (Patient taking differently: Take 700 mg by mouth at bedtime.)     rosuvastatin  (CRESTOR ) 10 MG tablet  TAKE 1 TABLET(10 MG) BY MOUTH DAILY 90 tablet 3   No current facility-administered medications for this visit.     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 Waddell   Sleep apnea    SOB (shortness of breath)     ROS:   All systems reviewed  and negative except as noted in the HPI.   Past Surgical History:  Procedure Laterality Date   ABDOMINAL HYSTERECTOMY     APPENDECTOMY     BREAST BIOPSY Left 2011   BREAST BIOPSY  2008   BREAST LUMPECTOMY Left 2008   CATARACT EXTRACTION     CHOLECYSTECTOMY     COLONOSCOPY N/A 09/13/2013   Procedure: COLONOSCOPY;  Surgeon: [name] Andrea Rivet, MD;  [facility];  Service: Endoscopy;  Laterality: N/A;  730   COLONOSCOPY WITH PROPOFOL  N/A 07/23/2022   Procedure: COLONOSCOPY WITH PROPOFOL ;  Surgeon: Andrea Carlin POUR, DO;  [facility];  Service: Endoscopy;  Laterality: N/A;  11:30 am   EP IMPLANTABLE DEVICE N/A 01/06/2016   Procedure: Pacemaker Implant;  Surgeon: Andrea LELON Birmingham, MD;  Location: [facility] INVASIVE CV LAB;  Service: Cardiovascular;  Laterality: N/A;   ESOPHAGOGASTRODUODENOSCOPY N/A 01/17/2014   Procedure: ESOPHAGOGASTRODUODENOSCOPY (EGD);  Surgeon: [name] Andrea Rivet, MD;  [facility];  Service: Endoscopy;  Laterality: N/A;  245   EYE SURGERY     JOINT REPLACEMENT     >8 yrs ago   POLYPECTOMY  07/23/2022   Procedure: POLYPECTOMY;  Surgeon: Andrea Carlin POUR, DO;  [facility];  Service: Endoscopy;;   RIGHT/LEFT HEART CATH AND CORONARY ANGIOGRAPHY N/A 01/01/2020   Procedure: RIGHT/LEFT HEART CATH AND CORONARY ANGIOGRAPHY;  Surgeon: Andrea Lonni BIRCH, MD;  Location: [facility] INVASIVE CV LAB;  Service: Cardiovascular;  Laterality: N/A;   TOTAL KNEE ARTHROPLASTY Bilateral right knee   2012, 2007     Family History  Problem Relation Age of Onset   Cancer Mother    Breast cancer Mother    Bipolar disorder Mother    Diabetes Mother    Depression Mother    Obesity Mother    Anxiety disorder Mother    Heart disease Mother    Vision loss Mother    Alcohol abuse Father    Hypertension Father    Alcoholism Father    Bipolar disorder Sister    Diabetes Sister    Heart disease Other      Social History   Socioeconomic History   Marital status: Married    Spouse name: Not on file   Number of children: 3   Years of education: college   Highest education level: Associate degree: occupational, Scientist, product/process development, or vocational program  Occupational History   Occupation: Charity fundraiser   Occupation: Retired Charity fundraiser  Tobacco Use   Smoking status: Never    Passive exposure: Never   Smokeless tobacco: Never  Vaping Use   Vaping status: Never Used  Substance and Sexual Activity   Alcohol use: No    Alcohol/week: 0.0 standard drinks of alcohol   Drug use: No   Sexual activity: Not Currently  Other Topics Concern   Not on file  Social History Narrative   04/24/2013 [name] was born and grew up in [place] . She reports that her childhood was tough. She has 2 older sisters and a younger brother. She achieved an Associates Degree in nursing. She has been working as an Charity fundraiser for 40 years. She is separated from her husband for 6 years. She has 2 daughters and one son.  She denies any legal difficulties. She affiliates as Curator. Her hobbies include reading, sewing, crafts, and cooking. She reports that her social support system consists of her friend and passed her. 04/24/2013 [name]      Lives alone.   Right-handed.  No caffeine use.   Social Drivers of Corporate investment banker Strain: Low Risk  (11/25/2023)   Overall Financial Resource Strain (CARDIA)    Difficulty of Paying Living Expenses: Not hard at all  Food Insecurity: No Food Insecurity (11/25/2023)   Hunger Vital Sign    Worried About Running Out of Food in the Last Year: Never true    Ran Out of Food in the Last Year: Never true  Transportation Needs: No Transportation Needs (11/25/2023)   PRAPARE - Administrator, Civil Service (Medical): No    Lack of Transportation (Non-Medical): No  Physical Activity: Insufficiently Active (11/25/2023)   Exercise Vital Sign    Days of Exercise per Week: 3 days    Minutes of Exercise per Session: 30 min  Stress: No Stress Concern Present (11/25/2023)   [employer] of Occupational Health - Occupational Stress Questionnaire    Feeling of Stress : Not at all  Social Connections: Moderately Integrated (11/25/2023)   Social Connection and Isolation Panel    Frequency of Communication with Friends and Family: More than three times a week    Frequency of Social Gatherings with Friends and Family: Three times a week    Attends Religious Services: More than 4 times per year    Active Member of Clubs or Organizations: Yes    Attends Banker Meetings: More than 4 times per year    Marital Status: Separated  Intimate Partner Violence: Not At Risk (11/25/2023)   Humiliation, Afraid, Rape, and Kick questionnaire    Fear of Current or Ex-Partner: No    Emotionally Abused: No    Physically Abused: No    Sexually Abused: No     BP (!) 140/74 (BP Location: Left Arm, Patient Position: Sitting, Cuff Size: Large)   Pulse 60   Ht 5'  4 (1.626 m)   Wt 282 lb (127.9 kg)   SpO2 95%   BMI 48.41 kg/m   Physical Exam:  obese appearing NAD HEENT: Unremarkable Neck:  No JVD, no thyromegally Lymphatics:  No adenopathy Back:  No CVA tenderness Lungs:  Clear HEART:  IRegular rate rhythm, no murmurs, no rubs, no clicks Abd:  soft, positive bowel sounds, no organomegally, no rebound, no guarding Ext:  2 plus pulses, no edema, no cyanosis, no clubbing Skin:  No rashes no nodules Neuro:  CN II through XII intact, motor grossly intact   Assess/Plan: Sinus node dysfunction - she is asymptomatic s/p PPM insertion.  Morbid obesity - I encouraged the patient to lose weight. She has gained 14 lbs since her last visit. Diastolic heart failure - her symptoms are class 2.  PPM -her biotronik DDD PPM is working normally. We will follow.  Andrea Meshulem Onorato,MD

## 2024-01-05 NOTE — Patient Instructions (Signed)
 Medication Instructions:   May take an extra 1/2 Zebeta  ( Bisoprolol  ) daily for Palpitations   *If you need a refill on your cardiac medications before your next appointment, please call your pharmacy*  Lab Work: NONE   If you have labs (blood work) drawn today and your tests are completely normal, you will receive your results only by: MyChart Message (if you have MyChart) OR A paper copy in the mail If you have any lab test that is abnormal or we need to change your treatment, we will call you to review the results.  Testing/Procedures: NONE   Follow-Up: At [facility], you and your health needs are our priority.  As part of our continuing mission to provide you with exceptional heart care, our providers are all part of one team.  This team includes your primary Cardiologist (physician) and Advanced Practice Providers or APPs (Physician Assistants and Nurse Practitioners) who all work together to provide you with the care you need, when you need it.  Your next appointment:   1 year(s)  Provider:   Danelle Birmingham, MD    We recommend signing up for the patient portal called MyChart.  Sign up information is provided on this After Visit Summary.  MyChart is used to connect with patients for Virtual Visits (Telemedicine).  Patients are able to view lab/test results, encounter notes, upcoming appointments, etc.  Non-urgent messages can be sent to your provider as well.   To learn more about what you can do with MyChart, go to [email or web address].   Other Instructions Thank you for choosing [facility]!

## 2024-01-06 ENCOUNTER — Ambulatory Visit (INDEPENDENT_AMBULATORY_CARE_PROVIDER_SITE_OTHER): Payer: PPO

## 2024-01-06 DIAGNOSIS — I495 Sick sinus syndrome: Secondary | ICD-10-CM | POA: Diagnosis not present

## 2024-01-06 LAB — CUP PACEART REMOTE DEVICE CHECK
Battery Voltage: 50
Date Time Interrogation Session: 20250627084413
Implantable Lead Connection Status: 753985
Implantable Lead Connection Status: 753985
Implantable Lead Implant Date: 20170627
Implantable Lead Implant Date: 20170627
Implantable Lead Location: 753859
Implantable Lead Location: 753860
Implantable Lead Model: 377
Implantable Lead Model: 377
Implantable Lead Serial Number: 49436227
Implantable Lead Serial Number: 49471106
Implantable Pulse Generator Implant Date: 20170627
Pulse Gen Model: 394969
Pulse Gen Serial Number: 68786455

## 2024-01-09 ENCOUNTER — Ambulatory Visit: Payer: Self-pay | Admitting: Internal Medicine

## 2024-01-09 DIAGNOSIS — H01021 Squamous blepharitis right upper eyelid: Secondary | ICD-10-CM | POA: Diagnosis not present

## 2024-01-09 DIAGNOSIS — H34232 Retinal artery branch occlusion, left eye: Secondary | ICD-10-CM | POA: Diagnosis not present

## 2024-01-09 DIAGNOSIS — Z961 Presence of intraocular lens: Secondary | ICD-10-CM | POA: Diagnosis not present

## 2024-01-09 DIAGNOSIS — H01024 Squamous blepharitis left upper eyelid: Secondary | ICD-10-CM | POA: Diagnosis not present

## 2024-01-16 ENCOUNTER — Encounter: Payer: Self-pay | Admitting: *Deleted

## 2024-01-17 ENCOUNTER — Telehealth: Payer: PPO | Admitting: Adult Health

## 2024-01-17 DIAGNOSIS — G4733 Obstructive sleep apnea (adult) (pediatric): Secondary | ICD-10-CM | POA: Diagnosis not present

## 2024-01-17 NOTE — Progress Notes (Signed)
 PATIENT: [name] DOB: [birth year]  REASON FOR VISIT: follow up HISTORY FROM: patient PRIMARY NEUROLOGIST: Dr. Chalice  Virtual Visit via Video Note  I connected with [name] on 01/17/24 at  2:30 PM EDT by a video enabled telemedicine application located remotely at [facility] and verified that I am speaking with the correct person using two identifiers who was located at their own home.   I discussed the limitations of evaluation and management by telemedicine and the availability of in person appointments. The patient expressed understanding and agreed to proceed.   PATIENT: [name] DOB: [birth year]  REASON FOR VISIT: follow up HISTORY FROM: patient  HISTORY OF PRESENT ILLNESS: Today 01/17/24:  [name] is a 73 y.o. female with a history of OSA on CPAP. Returns today for follow-up. Reports that cpap is working well.  Denies any new issues.     01/18/23: [name] is a 73 y.o. female with a history of OSA on CPAP. Returns today for follow-up. Reports that CPAP is working well.  Occasionally the mask would blow air into her eyes.  She has been using eyedrops at night and that has seemed to help.  Her download is below       01/19/22: [name] is a 73 year old female with  OSA on CPAP.She returns today for follow-up. Reports that CPAP is working well.  She states that she does notice that the CPAP straps is causing her hair to thin on the sides.  Denies any other issues.  She joins us  today for virtual visit.    REVIEW OF SYSTEMS: Out of a complete 14 system review of symptoms, the patient complains only of the following symptoms, and all other reviewed systems are negative.  ALLERGIES: Allergies  Allergen Reactions   Imipramine Rash   Tricor [Fenofibrate] Other (See Comments)    Pain, aching    HOME MEDICATIONS: Outpatient Medications Prior to Visit  Medication Sig Dispense Refill   acetaminophen   (TYLENOL ) 650 MG CR tablet Take 1,300 mg by mouth 2 (two) times daily.     ALPRAZolam  (XANAX  XR) 1 MG 24 hr tablet Take 1 mg by mouth in the morning and at bedtime. Taking once a day     ALPRAZolam  (XANAX ) 1 MG tablet Take 1 mg by mouth 2 (two) times daily as needed for anxiety.     aspirin  EC 81 MG tablet Take 1 tablet (81 mg total) by mouth daily. Swallow whole. 90 tablet 3   bevacizumab  (AVASTIN ) 1.25 mg/0.1 mL SOLN 1.25 mg by Intravitreal route to Surgery. Every 8 weeks.     bisoprolol  (ZEBETA ) 5 MG tablet Take 0.5 tablets (2.5 mg total) by mouth daily. 45 tablet 3   chlorthalidone  (HYGROTON ) 25 MG tablet Take 12.5 mg by mouth daily.     Cholecalciferol (VITAMIN D3) 1.25 MG (50000 UT) CAPS Take 1 capsule by mouth once a week.     glucosamine-chondroitin 500-400 MG tablet Take 2 tablets by mouth every morning.      lamoTRIgine  (LAMICTAL ) 200 MG tablet Take 400 mg by mouth at bedtime.     Lutein 20 MG CAPS Take 20 mg by mouth every morning.     Multiple Vitamin (MULTIVITAMIN) tablet Take 1 tablet by mouth daily. Senior     QUEtiapine  (SEROQUEL ) 200 MG tablet Take 800 mg by mouth at bedtime. (Patient taking differently: Take 700 mg by mouth at bedtime.)     rosuvastatin  (CRESTOR ) 10 MG tablet TAKE  1 TABLET(10 MG) BY MOUTH DAILY 90 tablet 3   No facility-administered medications prior to visit.    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 Waddell   Sleep apnea    SOB (shortness of breath)     PAST SURGICAL HISTORY: Past Surgical History:  Procedure Laterality Date   ABDOMINAL HYSTERECTOMY     APPENDECTOMY     BREAST BIOPSY Left 2011   BREAST BIOPSY  2008   BREAST LUMPECTOMY Left 2008   CATARACT EXTRACTION     CHOLECYSTECTOMY     COLONOSCOPY N/A 09/13/2013   Procedure: COLONOSCOPY;  Surgeon: Claudis RAYMOND Rivet, MD;  [facility];  Service: Endoscopy;  Laterality: N/A;  730   COLONOSCOPY WITH PROPOFOL  N/A 07/23/2022   Procedure: COLONOSCOPY WITH PROPOFOL ;  Surgeon: Cindie Carlin POUR, DO;  [facility];  Service: Endoscopy;  Laterality: N/A;  11:30 am   EP IMPLANTABLE DEVICE N/A 01/06/2016   Procedure: Pacemaker Implant;  Surgeon: Danelle LELON Waddell, MD;  Location: [facility] INVASIVE CV LAB;  Service: Cardiovascular;  Laterality: N/A;   ESOPHAGOGASTRODUODENOSCOPY N/A 01/17/2014   Procedure: ESOPHAGOGASTRODUODENOSCOPY (EGD);  Surgeon: Claudis RAYMOND Rivet, MD;  [facility];  Service: Endoscopy;  Laterality: N/A;  245   EYE SURGERY     JOINT REPLACEMENT     >8 yrs ago   POLYPECTOMY  07/23/2022   Procedure: POLYPECTOMY;  Surgeon: Cindie Carlin POUR, DO;  [facility];  Service: Endoscopy;;   RIGHT/LEFT HEART CATH AND CORONARY ANGIOGRAPHY N/A 01/01/2020   Procedure: RIGHT/LEFT HEART CATH AND CORONARY ANGIOGRAPHY;  Surgeon: Verlin Lonni BIRCH, MD;  Location: [facility] INVASIVE CV LAB;  Service: Cardiovascular;  Laterality: N/A;   TOTAL KNEE ARTHROPLASTY Bilateral right knee   2012, 2007    FAMILY HISTORY: Family History  Problem Relation Age of Onset   Cancer Mother    Breast cancer Mother    Bipolar disorder Mother    Diabetes Mother    Depression Mother    Obesity Mother    Anxiety  disorder Mother    Heart disease Mother    Vision loss Mother    Alcohol abuse Father    Hypertension Father    Alcoholism Father    Bipolar disorder Sister    Diabetes Sister    Heart disease Other     SOCIAL HISTORY: Social History   Socioeconomic History   Marital status: Married    Spouse name: Not on file   Number of children: 3  Years of education: college   Highest education level: Associate degree: occupational, Scientist, product/process development, or vocational program  Occupational History   Occupation: Charity fundraiser   Occupation: Retired Charity fundraiser  Tobacco Use   Smoking status: Never    Passive exposure: Never   Smokeless tobacco: Never  Vaping Use   Vaping status: Never Used  Substance and Sexual Activity   Alcohol use: No    Alcohol/week: 0.0 standard drinks of alcohol   Drug use: No   Sexual activity: Not Currently  Other Topics Concern   Not on file  Social History Narrative   04/24/2013 [name] [name] was born and grew up in [place] . She reports that her childhood was tough. She has 2 older sisters and a younger brother. She achieved an Associates Degree in nursing. She has been working as an Charity fundraiser for 40 years. She is separated from her husband for 6 years. She has 2 daughters and one son. She denies any legal difficulties. She affiliates as Curator. Her hobbies include reading, sewing, crafts, and cooking. She reports that her social support system consists of her friend and passed her. 04/24/2013 [name]      Lives alone.   Right-handed.   No caffeine use.   Social Drivers of Corporate investment banker Strain: Low Risk  (11/25/2023)   Overall Financial Resource Strain (CARDIA)    Difficulty of Paying Living Expenses: Not hard at all  Food Insecurity: No Food Insecurity (11/25/2023)   Hunger Vital Sign    Worried About Running Out of Food in the Last Year: Never true    Ran Out of Food in the Last Year: Never true  Transportation Needs: No Transportation Needs (11/25/2023)   PRAPARE -  Administrator, Civil Service (Medical): No    Lack of Transportation (Non-Medical): No  Physical Activity: Insufficiently Active (11/25/2023)   Exercise Vital Sign    Days of Exercise per Week: 3 days    Minutes of Exercise per Session: 30 min  Stress: No Stress Concern Present (11/25/2023)   [employer] of Occupational Health - Occupational Stress Questionnaire    Feeling of Stress : Not at all  Social Connections: Moderately Integrated (11/25/2023)   Social Connection and Isolation Panel    Frequency of Communication with Friends and Family: More than three times a week    Frequency of Social Gatherings with Friends and Family: Three times a week    Attends Religious Services: More than 4 times per year    Active Member of Clubs or Organizations: Yes    Attends Banker Meetings: More than 4 times per year    Marital Status: Separated  Intimate Partner Violence: Not At Risk (11/25/2023)   Humiliation, Afraid, Rape, and Kick questionnaire    Fear of Current or Ex-Partner: No    Emotionally Abused: No    Physically Abused: No    Sexually Abused: No      PHYSICAL EXAM Generalized: Well developed, in no acute distress   Neurological examination  Mentation: Alert oriented to time, place, history taking. Follows all commands speech and language fluent Cranial nerve II-XII:Extraocular movements were full. Facial symmetry noted.  Head turning and shoulder shrug  were normal and symmetric.   DIAGNOSTIC DATA (LABS, IMAGING, TESTING) - I reviewed patient records, labs, notes, testing and imaging myself where available.  Lab Results  Component Value Date   WBC 5.8 09/22/2023   HGB 13.7 09/22/2023   HCT 42.0 09/22/2023   MCV  97.0 09/22/2023   PLT 239 09/22/2023      Component Value Date/Time   NA 140 09/22/2023 1504   NA 142 05/19/2023 1033   K 3.8 09/22/2023 1504   CL 98 09/22/2023 1504   CO2 30 09/22/2023 1504   GLUCOSE 112 (H) 09/22/2023 1504    BUN 15 09/22/2023 1504   BUN 16 05/19/2023 1033   CREATININE 0.76 09/22/2023 1504   CREATININE 0.80 12/30/2015 1138   CALCIUM  9.9 09/22/2023 1504   PROT 7.0 10/19/2023 1008   ALBUMIN 4.5 10/19/2023 1008   AST 24 10/19/2023 1008   ALT 23 10/19/2023 1008   ALKPHOS 114 10/19/2023 1008   BILITOT 0.3 10/19/2023 1008   GFRNONAA >60 09/22/2023 1504   GFRAA 84 06/30/2020 0848   Lab Results  Component Value Date   CHOL 124 10/19/2023   HDL 51 10/19/2023   LDLCALC 44 10/19/2023   TRIG 177 (H) 10/19/2023   CHOLHDL 2.4 10/19/2023   Lab Results  Component Value Date   HGBA1C 6.5 (H) 10/19/2023   Lab Results  Component Value Date   VITAMINB12 866 12/12/2017   Lab Results  Component Value Date   TSH 1.520 10/19/2023      ASSESSMENT AND PLAN 73 y.o. year old female  has a past medical history of Allergy, Anxiety, Back pain, Bipolar 1 disorder (HCC), Bradycardia, sinus, Breast cancer (HCC), Breast cancer, left breast (HCC) (07/22/2011), Cataract, Central artery occlusion of retina (11/2020), Chest pain, CHF (congestive heart failure) (HCC), Constipation, Depression, Essential hypertension, Hyperlipidemia, IBS (irritable bowel syndrome), Macular degeneration, Memory loss, OA (osteoarthritis), Obesity, Palpitations, Personal history of chemotherapy (2009), Personal history of radiation therapy (2009), Polycystic ovarian disease, Prediabetes, Presence of permanent cardiac pacemaker, Sinus node dysfunction (HCC) (12/2015), Sleep apnea, and SOB (shortness of breath). here with:  OSA on CPAP  CPAP compliance excellent Residual AHI is good Encouraged patient to continue using CPAP nightly and > 4 hours each night F/U in 1 year or sooner if needed    Duwaine Russell, MSN, NP-C 01/17/2024, 2:29 PM [facility] [address], [address] [place], [place] [address] [phone]

## 2024-02-07 ENCOUNTER — Encounter: Payer: Self-pay | Admitting: Emergency Medicine

## 2024-02-07 ENCOUNTER — Ambulatory Visit
Admission: EM | Admit: 2024-02-07 | Discharge: 2024-02-07 | Disposition: A | Discharge status: Home / Self Care | Attending: Nurse Practitioner | Admitting: Nurse Practitioner

## 2024-02-07 DIAGNOSIS — R5383 Other fatigue: Secondary | ICD-10-CM | POA: Diagnosis not present

## 2024-02-07 LAB — POC SOFIA SARS ANTIGEN FIA: SARS Coronavirus 2 Ag: NEGATIVE

## 2024-02-07 NOTE — ED Provider Notes (Signed)
 [facility]    CSN: [id number] Arrival date & time: 02/07/24  1602      History   Chief Complaint No chief complaint on file.   HPI [name] is a 73 y.o. female.   The history is provided by the patient.   Patient presents for a 2 to 3-day history of increased fatigue and bodyaches.  She denies fever, chills, headache, nasal congestion, runny nose, or cough.  Patient with underlying history of OSA.  States that she is not having any difficulty with her CPAP machine.  Patient is requesting COVID testing today.  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   Cotton wool spots 12/01/2020   Obstructive sleep apnea of adult 08/26/2020   Cystoid macular edema, right eye 08/12/2020   Intermediate stage nonexudative age-related macular degeneration of both eyes 08/12/2020   Vitreomacular adhesion of  right eye 08/12/2020   Serous retinal detachment of right eye 08/12/2020   Type 2 macular telangiectasis, 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   Essential hypertension 03/06/2020   Prediabetes 02/10/2020   Cardiac pacemaker in situ 01/07/2020   Obesity with alveolar hypoventilation and body mass index (BMI) of 40 or greater (HCC) 01/07/2020   DOE (dyspnea on exertion) 12/05/2019   Chronic diastolic CHF (congestive heart failure) (HCC) 12/05/2019   Morbid obesity (HCC) 09/05/2017   Sinus node dysfunction (HCC) 01/06/2016   Bipolar disorder (HCC) 10/22/2015   Anxiety 10/22/2015   Episodic lightheadedness    GERD (gastroesophageal reflux disease) 01/08/2014   Osteoarthritis 11/08/2013   Lumbar back pain 11/08/2013   History of left breast cancer (2013) 07/22/2011    Past Surgical History:  Procedure Laterality Date   ABDOMINAL HYSTERECTOMY     APPENDECTOMY     BREAST BIOPSY Left 2011   BREAST BIOPSY  2008   BREAST LUMPECTOMY Left 2008   CATARACT EXTRACTION     CHOLECYSTECTOMY     COLONOSCOPY N/A 09/13/2013   Procedure: COLONOSCOPY;  Surgeon: Claudis RAYMOND Rivet, MD;  Location: [facility];  Service: Endoscopy;  Laterality: N/A;  730   COLONOSCOPY WITH PROPOFOL  N/A 07/23/2022   Procedure: COLONOSCOPY WITH PROPOFOL ;  Surgeon: Cindie Carlin POUR, DO;  Location:  [facility];  Service: Endoscopy;  Laterality: N/A;  11:30 am   EP IMPLANTABLE DEVICE N/A 01/06/2016   Procedure: Pacemaker Implant;  Surgeon: Danelle LELON Birmingham, MD;  Location: [facility] IN[facility]AS[facility] CV LAB;  Service: Cardiovascular;  Laterality: N/A;   ESOPHAGOGASTRODUODENOSCOPY N/A 01/17/2014   Procedure: ESOPHAGOGASTRODUODENOSCOPY (EGD);  Surgeon: Claudis RAYMOND Rivet, MD;  Location: [facility];  Service: Endoscopy;  Laterality: N/A;  245   EYE SURGERY     JOINT REPLACEMENT     >8 yrs ago   POLYPECTOMY  07/23/2022   Procedure: POLYPECTOMY;  Surgeon: Cindie Carlin POUR, DO;  Location: [facility];  Service: Endoscopy;;   RIGHT/LEFT HEART CATH AND CORONARY  ANGIOGRAPHY N/A 01/01/2020   Procedure: RIGHT/LEFT HEART CATH AND CORONARY ANGIOGRAPHY;  Surgeon: Verlin Lonni BIRCH, MD;  Location: [facility];  Service: Cardiovascular;  Laterality: N/A;   TOTAL KNEE ARTHROPLASTY Bilateral right knee   2012, 2007    OB History     Gravida  3   Para      Term      Preterm      AB      Living         SAB      IAB      Ectopic      Multiple      Live Births               Home Medications    Prior to Admission medications   Medication Sig Start Date End Date Taking? Authorizing Provider  acetaminophen  (TYLENOL ) 650 MG CR tablet Take 1,300 mg by mouth 2 (two) times daily.    [provider]  ALPRAZolam  (XANAX  XR) 1 MG 24 hr tablet Take 1 mg by mouth in the morning and at bedtime. Taking once a day    [provider]  ALPRAZolam  (XANAX ) 1 MG tablet Take 1 mg by mouth 2 (two) times daily as needed for anxiety.    [provider]  aspirin  EC 81 MG tablet Take 1 tablet (81 mg total) by mouth daily. Swallow whole. 02/04/22   Debera Jayson MATSU, MD  bisoprolol  (ZEBETA ) 5 MG tablet Take 0.5 tablets (2.5 mg total) by mouth daily. 10/25/23   Debera Jayson MATSU, MD  chlorthalidone  (HYGROTON ) 25 MG tablet Take 12.5 mg by mouth daily.    [provider]  Cholecalciferol (VITAMIN D3) 1.25 MG (50000 UT) CAPS Take 1 capsule by mouth once a week. 12/31/23   [provider]  glucosamine-chondroitin 500-400 MG tablet Take 2 tablets by mouth every morning.     [provider]  lamoTRIgine  (LAMICTAL ) 200 MG tablet Take 400 mg by mouth at bedtime.    [provider]  Lutein 20 MG CAPS Take 20 mg by mouth every morning. 01/28/21   [provider]  Multiple Vitamin (MULTIVITAMIN) tablet Take 1 tablet by mouth daily. Senior    [provider]  QUEtiapine  (SEROQUEL ) 200 MG tablet Take 800 mg by mouth at bedtime. Patient taking differently: Take 700 mg by mouth at  bedtime. 02/26/23   [provider]  rosuvastatin  (CRESTOR ) 10 MG tablet TAKE 1 TABLET(10 MG) BY MOUTH DAILY 04/13/23   Debera Jayson MATSU, MD    Family History Family History  Problem Relation Age of Onset   Cancer Mother    Breast cancer Mother    Bipolar disorder Mother    Diabetes Mother    Depression Mother    Obesity Mother  Anxiety disorder Mother    Heart disease Mother    Vision loss Mother    Alcohol abuse Father    Hypertension Father    Alcoholism Father    Bipolar disorder Sister    Diabetes Sister    Heart disease Other     Social History Social History   Tobacco Use   Smoking status: Never    Passive exposure: Never   Smokeless tobacco: Never  Vaping Use   Vaping status: Never Used  Substance Use Topics   Alcohol use: No    Alcohol/week: 0.0 standard drinks of alcohol   Drug use: No     Allergies   Imipramine and Tricor [fenofibrate]   Review of Systems Review of Systems Per HPI  Physical Exam Triage Vital Signs ED Triage Vitals  Encounter Vitals Group     BP 02/07/24 1613 131/63     Girls Systolic BP Percentile --      Girls Diastolic BP Percentile --      Boys Systolic BP Percentile --      Boys Diastolic BP Percentile --      Pulse Rate 02/07/24 1613 71     Resp 02/07/24 1613 18     Temp 02/07/24 1613 98.6 F (37 C)     Temp Source 02/07/24 1613 Oral     SpO2 02/07/24 1613 93 %     Weight --      Height --      Head Circumference --      Peak Flow --      Pain Score 02/07/24 1614 2     Pain Loc --      Pain Education --      Exclude from Growth Chart --    No data found.  Updated Vital Signs BP 131/63 (BP Location: Right Arm)   Pulse 71   Temp 98.6 F (37 C) (Oral)   Resp 18   SpO2 93%   Visual Acuity Right Eye Distance:   Left Eye Distance:   Bilateral Distance:    Right Eye Near:   Left Eye Near:    Bilateral Near:     Physical Exam Vitals and nursing note reviewed.  Constitutional:       General: She is not in acute distress.    Appearance: Normal appearance.  HENT:     Head: Normocephalic.     Right Ear: Tympanic membrane, ear canal and external ear normal.     Left Ear: Tympanic membrane, ear canal and external ear normal.     Nose: Nose normal.     Mouth/Throat:     Mouth: Mucous membranes are moist.  Eyes:     Extraocular Movements: Extraocular movements intact.     Conjunctiva/sclera: Conjunctivae normal.     Pupils: Pupils are equal, round, and reactive to light.  Cardiovascular:     Rate and Rhythm: Normal rate and regular rhythm.     Pulses: Normal pulses.     Heart sounds: Normal heart sounds.  Pulmonary:     Effort: Pulmonary effort is normal. No respiratory distress.     Breath sounds: Normal breath sounds. No stridor. No wheezing, rhonchi or rales.  Abdominal:     General: Bowel sounds are normal.     Palpations: Abdomen is soft.  Musculoskeletal:     Cervical back: Normal range of motion.  Skin:    General: Skin is warm and dry.  Neurological:     General: No focal deficit  present.     Mental Status: She is alert and oriented to person, place, and time.  Psychiatric:        Mood and Affect: Mood normal.        Behavior: Behavior normal.      UC Treatments / Results  Labs (all labs ordered are listed, but only abnormal results are displayed) Labs Reviewed  POC SOFIA SARS ANTIGEN FIA    EKG   Radiology No results found.  Procedures Procedures (including critical care time)  Medications Ordered in UC Medications - No data to display  Initial Impression / Assessment and Plan / UC Course  I have reviewed the triage vital signs and the nursing notes.  Pertinent labs & imaging results that were available during my care of the patient were reviewed by me and considered in my medical decision making (see chart for details).  Patient presents for complaints of increased fatigue and bodyaches.  COVID test was negative.  On exam, lung  sounds were clear throughout, room air sats at 93%.  Patient is well-appearing and is in no acute distress.  Patient with underlying history of OSA.  Supportive care recommendations were provided and discussed with patient to include fluids, rest, and over-the-counter analgesics.  Discussed with patient recommendation to follow-up with neurology for reevaluation of her OSA or with her PCP.  Patient was in agreement with this plan of care and verbalizes understanding.  All questions were answered.  Patient stable for discharge.  Final Clinical Impressions(s) / UC Diagnoses   Final diagnoses:  None   Discharge Instructions   None    ED Prescriptions   None    PDMP not reviewed this encounter.   Gilmer Etta PARAS, NP 02/07/24 [phone]

## 2024-02-07 NOTE — ED Triage Notes (Signed)
 States very sleepy and body aches x 2 days

## 2024-02-07 NOTE — Discharge Instructions (Addendum)
 The COVID test was negative. Increase fluids and allow for plenty of rest. You may take over-the-counter Tylenol  as needed for pain, fever, or general discomfort. If symptoms continue to persist, recommend following up with neurology or your primary care physician for further evaluation. Follow-up as needed.

## 2024-02-09 DIAGNOSIS — F4323 Adjustment disorder with mixed anxiety and depressed mood: Secondary | ICD-10-CM | POA: Diagnosis not present

## 2024-02-21 DIAGNOSIS — H35371 Puckering of macula, right eye: Secondary | ICD-10-CM | POA: Diagnosis not present

## 2024-02-21 DIAGNOSIS — H35071 Retinal telangiectasis, right eye: Secondary | ICD-10-CM | POA: Diagnosis not present

## 2024-02-21 DIAGNOSIS — H353211 Exudative age-related macular degeneration, right eye, with active choroidal neovascularization: Secondary | ICD-10-CM | POA: Diagnosis not present

## 2024-02-21 DIAGNOSIS — H34232 Retinal artery branch occlusion, left eye: Secondary | ICD-10-CM | POA: Diagnosis not present

## 2024-02-21 DIAGNOSIS — H353132 Nonexudative age-related macular degeneration, bilateral, intermediate dry stage: Secondary | ICD-10-CM | POA: Diagnosis not present

## 2024-02-21 DIAGNOSIS — H43821 Vitreomacular adhesion, right eye: Secondary | ICD-10-CM | POA: Diagnosis not present

## 2024-02-21 DIAGNOSIS — H35351 Cystoid macular degeneration, right eye: Secondary | ICD-10-CM | POA: Diagnosis not present

## 2024-02-21 DIAGNOSIS — H33101 Unspecified retinoschisis, right eye: Secondary | ICD-10-CM | POA: Diagnosis not present

## 2024-02-21 DIAGNOSIS — H3321 Serous retinal detachment, right eye: Secondary | ICD-10-CM | POA: Diagnosis not present

## 2024-02-23 ENCOUNTER — Encounter: Attending: Family Medicine | Admitting: Nutrition

## 2024-02-23 VITALS — Ht 64.0 in | Wt 277.0 lb

## 2024-02-23 DIAGNOSIS — I10 Essential (primary) hypertension: Secondary | ICD-10-CM | POA: Insufficient documentation

## 2024-02-23 DIAGNOSIS — E118 Type 2 diabetes mellitus with unspecified complications: Secondary | ICD-10-CM | POA: Diagnosis not present

## 2024-02-23 DIAGNOSIS — I5032 Chronic diastolic (congestive) heart failure: Secondary | ICD-10-CM | POA: Diagnosis not present

## 2024-02-23 NOTE — Progress Notes (Unsigned)
 Medical Nutrition Therapy  Appointment Start time:  1100  Appointment End time:  1216 Primary concerns today: Pre DM, Obesity, HTN  Referral diagnosis: R73.03, E66.01 Preferred learning style: Hands on, auditory. Has vision issues.  Learning readiness: Ready   NUTRITION ASSESSMENT  follow up  Walking 3 times per week - 1 mile  Lost 2 lbs.  PCP Dr. Alphonsa Talked to MD about seroquel and will start reducing the dose and may find an alternative. She feels this medication is causing her weight gain. Drinking more water.  Not testing blood sugars. A1C 6.5% April 2025. Not on any medications for her DM . Not exercising much. Still eating out and  drinking some milkshakes from cookout. Had 2 hot dogs and milkshake.. Feels very sluggish  and tired usually every day at about 2 pm. May be having reactive hypoglycemia after a highly processed carb meal. Needs to get meter and testing supplies.   Anthropometrics  Wt Readings from Last 3 Encounters:  01/05/24 282 lb (127.9 kg)  12/19/23 280 lb (127 kg)  11/25/23 282 lb (127.9 kg)   Ht Readings from Last 3 Encounters:  01/05/24 5' 4 (1.626 m)  12/19/23 5' 4 (1.626 m)  11/25/23 5' 4 (1.626 m)   There is no height or weight on file to calculate BMI. @BMIFA @ Facility age limit for growth %iles is 20 years. Facility age limit for growth %iles is 20 years. Clinical Medical Hx: See chart Medications: See chart Labs:     Latest Ref Rng & Units 10/19/2023   10:08 AM 09/22/2023    3:04 PM 05/19/2023   10:33 AM  CMP  Glucose 70 - 99 mg/dL  887  857   BUN 8 - 23 mg/dL  15  16   Creatinine 9.55 - 1.00 mg/dL  9.23  9.27   Sodium 864 - 145 mmol/L  140  142   Potassium 3.5 - 5.1 mmol/L  3.8  4.1   Chloride 98 - 111 mmol/L  98  99   CO2 22 - 32 mmol/L  30  28   Calcium 8.9 - 10.3 mg/dL  9.9  9.6   Total Protein 6.0 - 8.5 g/dL 7.0     Total Bilirubin 0.0 - 1.2 mg/dL 0.3     Alkaline Phos 44 - 121 IU/L 114     AST 0 - 40 IU/L 24     ALT 0 -  32 IU/L 23      Lipid Panel     Component Value Date/Time   CHOL 124 10/19/2023 1008   TRIG 177 (H) 10/19/2023 1008   HDL 51 10/19/2023 1008   CHOLHDL 2.4 10/19/2023 1008   CHOLHDL 5.0 02/06/2020 0859   VLDL 46 (H) 02/06/2020 0859   LDLCALC 44 10/19/2023 1008   LABVLDL 29 10/19/2023 1008   Lab Results  Component Value Date   HGBA1C 6.5 (H) 10/19/2023    Notable Signs/Symptoms: Limited eye sight  Lifestyle & Dietary Hx LIves with her niece. Eats at home and Eats out some.  Estimated daily fluid intake: 64 oz Supplements: MVI,  Sleep: good Stress / self-care: a lot over her eye issues.  Current average weekly physical activity: ADL can't do a lot due to her eye issues.  24-Hr Dietary Recall B) Oatmeal, 1 cup cooked, banana or apple, yogurt L) PB sandwich, apple, carrots, water D)Prepackaged salad kits, egg and beans, water Snacks: more fruits and vegetables.    Estimated Energy Needs Calories: 1200 Carbohydrate: 135g  Protein: 90g Fat: 33g   NUTRITION DIAGNOSIS  NI-1.7 Predicted excessive energy intake As related to Obesity and Diabetes Type 2  As evidenced by A1c 6.5%  and BMI 45.   NUTRITION INTERVENTION  Nutrition education (E-1) on the following topics:  Nutrition and Diabetes education provided on My Plate, CHO counting, meal planning, portion sizes, timing of meals, avoiding snacks between meals unless having a low blood sugar, target ranges for A1C and blood sugars, signs/symptoms and treatment of hyper/hypoglycemia, monitoring blood sugars, taking medications as prescribed, benefits of exercising 30 minutes per day and prevention of complications of DM.  Lifestyle Medicine  - Whole Food, Plant Predominant Nutrition is highly recommended: Eat Plenty of vegetables, Mushrooms, fruits, Legumes, Whole Grains, Nuts, seeds in lieu of processed meats, processed snacks/pastries red meat, poultry, eggs.    -It is better to avoid simple carbohydrates including:  Cakes, Sweet Desserts, Ice Cream, Soda (diet and regular), Sweet Tea, Candies, Chips, Cookies, Store Bought Juices, Alcohol in Excess of  1-2 drinks a day, Lemonade,  Artificial Sweeteners, Doughnuts, Coffee Creamers, Sugar-free Products, etc, etc.  This is not a complete list.....  Exercise: If you are able: 30 -60 minutes a day ,4 days a week, or 150 minutes a week.  The longer the better.  Combine stretch, strength, and aerobic activities.  If you were told in the past that you have high risk for cardiovascular diseases, you may seek evaluation by your heart doctor prior to initiating moderate to intense exercise programs.    Handouts Provided Include  Lifestyle Medicine   Learning Style & Readiness for Change Teaching method utilized: Visual & Auditory  Demonstrated degree of understanding via: Teach Back  Barriers to learning/adherence to lifestyle change: none  Goals Established by Pt Goals Use MyFitnesspal app. Go back to the [facility] 4 times per week Talk to counselor when available Lose 2 lbs per month Get A1C down to 6%  Cut out snacks between meals of cookies, milkshakes or others. Get back to strengthening exercises 3 times per wek. Get back with using personal trainer Look into therapy options Lose 10 lbs in the next 3 months   MONITORING & EVALUATION Dietary intake, weekly physical activity, and weight in 3 month. Needs prescription for testing supplies to test blood sugar twice a day. Next Steps  Patient is to work on meal planning.SABRA

## 2024-02-23 NOTE — Patient Instructions (Addendum)
 Goals  Start reading food labels more Watch portion sizes Avoid fast foods Keep drinking water Keep walking 3 time per week and increase distance slowly. Lose 5 lbs in 3 months Get A1C close to 6%

## 2024-02-27 DIAGNOSIS — H35371 Puckering of macula, right eye: Secondary | ICD-10-CM | POA: Diagnosis not present

## 2024-02-27 DIAGNOSIS — H35351 Cystoid macular degeneration, right eye: Secondary | ICD-10-CM | POA: Diagnosis not present

## 2024-02-27 DIAGNOSIS — H33101 Unspecified retinoschisis, right eye: Secondary | ICD-10-CM | POA: Diagnosis not present

## 2024-02-27 DIAGNOSIS — H353211 Exudative age-related macular degeneration, right eye, with active choroidal neovascularization: Secondary | ICD-10-CM | POA: Diagnosis not present

## 2024-02-27 DIAGNOSIS — H35071 Retinal telangiectasis, right eye: Secondary | ICD-10-CM | POA: Diagnosis not present

## 2024-02-27 DIAGNOSIS — H3321 Serous retinal detachment, right eye: Secondary | ICD-10-CM | POA: Diagnosis not present

## 2024-02-27 DIAGNOSIS — H34232 Retinal artery branch occlusion, left eye: Secondary | ICD-10-CM | POA: Diagnosis not present

## 2024-02-28 ENCOUNTER — Encounter: Payer: Self-pay | Admitting: Cardiology

## 2024-02-29 LAB — HM DIABETES EYE EXAM

## 2024-03-01 ENCOUNTER — Encounter: Payer: Self-pay | Admitting: Nutrition

## 2024-03-13 ENCOUNTER — Telehealth: Payer: Self-pay

## 2024-03-13 NOTE — Telephone Encounter (Signed)
 Alert received from [employer]:    Outreach:

## 2024-03-14 ENCOUNTER — Telehealth: Payer: Self-pay

## 2024-03-14 MED ORDER — APIXABAN 5 MG PO TABS: 5.0000 mg | ORAL_TABLET | Freq: Two times a day (BID) | ORAL | 11 refills | Status: DC

## 2024-03-14 NOTE — Telephone Encounter (Signed)
Attempted to contact patient. No answer, left message to call back

## 2024-03-14 NOTE — Telephone Encounter (Signed)
 Secure Chat from Dr.McDowell: Chart reviewed. She has a history of PSVT but no clearly documented atrial fibrillation. CHA2DS2-VASc score is 4-5. Anticoagulation would therefore be recommended for stroke prophylaxis. She has a visit scheduled in the atrial fibrillation clinic on September 16. Lab work from earlier in the year showed normal hemoglobin and renal function. If she would like to go ahead and start Eliquis  5 mg twice daily in place of aspirin , that would be reasonable preceding her atrial fibrillation clinic visit. Please document this in the chart rather than secure chat however if she does start.      1422 hrs: left message for patient to return my call.  1430 hrs: I spoke with patient and she agrees with plan to start Eliquis   5 mg twice a day and Stop ASA. She will follow up with A-fib clinic as scheduled.  I e-scribed Eliquis  5 mg BID to [facility] on [address] to include 30 day FREE trial.

## 2024-03-14 NOTE — Telephone Encounter (Signed)
 Patient returning additional call from [facility]. See previous encounter.  She says she received another call from [facility] and switched to the other line while we were talking.

## 2024-03-14 NOTE — Telephone Encounter (Signed)
 Patient reports of increased fatigue. Denies palpitations or shortness of breath.   Offered patient AF clinic apt to discuss OAC candidate and medications or procedure to return to normal rhythm.  Patient is agreeable. Patient advised to expect call from AF clinic for apt info. Pt appreciative of c/b.

## 2024-03-15 MED ORDER — APIXABAN 5 MG PO TABS: 5.0000 mg | ORAL_TABLET | Freq: Two times a day (BID) | ORAL | 0 refills | Status: DC

## 2024-03-15 NOTE — Addendum Note (Signed)
 Addended by: Janece Laidlaw A on: 03/15/2024 04:19 PM   Modules accepted: Orders

## 2024-03-15 NOTE — Progress Notes (Signed)
 Remote pacemaker transmission.

## 2024-03-15 NOTE — Telephone Encounter (Signed)
 Patient stated she is returning RN's call regarding medication.

## 2024-03-20 DIAGNOSIS — F4323 Adjustment disorder with mixed anxiety and depressed mood: Secondary | ICD-10-CM | POA: Diagnosis not present

## 2024-03-21 ENCOUNTER — Encounter: Payer: Self-pay | Admitting: Family Medicine

## 2024-03-21 ENCOUNTER — Encounter: Payer: Self-pay | Admitting: Physician Assistant

## 2024-03-21 ENCOUNTER — Ambulatory Visit: Admitting: Physician Assistant

## 2024-03-21 VITALS — BP 100/66 | HR 60 | Temp 97.3°F | Ht 64.0 in | Wt 277.0 lb

## 2024-03-21 DIAGNOSIS — B029 Zoster without complications: Secondary | ICD-10-CM | POA: Diagnosis not present

## 2024-03-21 MED ORDER — VALACYCLOVIR HCL 1 G PO TABS: 1000.0000 mg | ORAL_TABLET | Freq: Three times a day (TID) | ORAL | 0 refills | Status: AC

## 2024-03-21 NOTE — Progress Notes (Signed)
 Acute Office Visit  Subjective:     Patient ID: [name], female    DOB: 09/01/1950, 73 y.o.   MRN: [id number]  Discussed the use of AI scribe software for clinical note transcription with the patient, who gave verbal consent to proceed.  History of Present Illness [name] is a 73 year old female who presents with a rash on the neck and upper back.  The rash appeared on the back of her neck yesterday morning, initially itchy and described as resembling 'spider bites' with redness and swelling. It became itchier and painful throughout the day, and she scratched it during the night. By this morning, it was tender, burning, and very itchy. There is no fever. The rash is located on the right side of her neck and upper back.  She is undergoing treatment for macular degeneration, including Avastin  injections every two months and phototherapy, and is currently halfway through an eight-treatment course. She recently started taking Eliquis  for atrial fibrillation. There have been no changes in her environment or potential exposure to spider bites. She denies any new trips or environmental changes that could have contributed to the rash.  Significant itching and a mild burning sensation are present. There is no fever, and she denies changes in the scalp or facial involvement. There is no stinging or significant pain.     Review of Systems  Constitutional:  Negative for fever.  Eyes:  Negative for pain, discharge and redness.  Musculoskeletal:  Negative for myalgias.  Skin:  Positive for itching and rash.  Neurological:  Negative for dizziness and headaches.        Objective:     BP 100/66   Pulse 60   Temp (!) 97.3 F (36.3 C)   Ht 5' 4 (1.626 m)   Wt 277 lb (125.6 kg)   SpO2 99%   BMI 47.55 kg/m   Physical Exam Constitutional:      General: She is not in acute distress.    Appearance: Normal appearance. She is obese. She is not ill-appearing.  HENT:     Head:  Normocephalic and atraumatic.  Eyes:     Extraocular Movements: Extraocular movements intact.     Conjunctiva/sclera: Conjunctivae normal.  Neck:   Cardiovascular:     Rate and Rhythm: Normal rate. Rhythm irregular.     Heart sounds: Normal heart sounds. No murmur heard. Pulmonary:     Effort: Pulmonary effort is normal.     Breath sounds: Normal breath sounds.  Skin:    General: Skin is warm and dry.     Findings: Rash present.  Neurological:     General: No focal deficit present.     Mental Status: She is alert.  Psychiatric:        Mood and Affect: Mood normal.        Behavior: Behavior normal.         No results found for any visits on 03/21/24.      Assessment & Plan:  Herpes zoster without complication Assessment & Plan: Mild shingles with itching, burning, vesicle-like lesions. Unilateral distribution supports diagnosis. No facial involvement. Discussed risk of postherpetic neuralgia and low transmission risk. - Prescribe valacyclovir  1000 mg three times daily for 7 days. - Advise to avoid contact with young children and immunocompromised individuals. - Instruct to report if rash spreads to face, especially nose, cheek, or eye. - Educate on potential for rash to worsen before improving and possibility of prolonged burning or aching. -  Advise to keep rash covered to minimize transmission risk.  Orders: -     valACYclovir  HCl; Take 1 tablet (1,000 mg total) by mouth 3 (three) times daily for 7 days.  Dispense: 21 tablet; Refill: 0    Return if symptoms worsen or fail to improve.  Charmaine Anner Baity, PA-C

## 2024-03-21 NOTE — Assessment & Plan Note (Addendum)
 Mild shingles with itching, burning, vesicle-like lesions. Unilateral distribution supports diagnosis. No facial involvement. Discussed risk of postherpetic neuralgia and low transmission risk. - Prescribe valacyclovir  1000 mg three times daily for 7 days. - Advise to avoid contact with young children and immunocompromised individuals. - Instruct to report if rash spreads to face, especially nose, cheek, or eye. - Educate on potential for rash to worsen before improving and possibility of prolonged burning or aching. - Advise to keep rash covered to minimize transmission risk.

## 2024-03-22 LAB — HM DIABETES EYE EXAM

## 2024-03-27 ENCOUNTER — Ambulatory Visit (HOSPITAL_COMMUNITY)
Admission: RE | Admit: 2024-03-27 | Discharge: 2024-03-27 | Disposition: A | Discharge status: Home / Self Care | Source: Ambulatory Visit | Attending: Internal Medicine | Admitting: Internal Medicine

## 2024-03-27 ENCOUNTER — Encounter (HOSPITAL_COMMUNITY): Payer: Self-pay | Admitting: Internal Medicine

## 2024-03-27 VITALS — BP 118/70 | HR 64 | Ht 64.0 in | Wt 278.8 lb

## 2024-03-27 DIAGNOSIS — I471 Supraventricular tachycardia, unspecified: Secondary | ICD-10-CM | POA: Diagnosis not present

## 2024-03-27 DIAGNOSIS — I4892 Unspecified atrial flutter: Secondary | ICD-10-CM

## 2024-03-27 DIAGNOSIS — I4891 Unspecified atrial fibrillation: Secondary | ICD-10-CM | POA: Insufficient documentation

## 2024-03-27 DIAGNOSIS — I4819 Other persistent atrial fibrillation: Secondary | ICD-10-CM | POA: Diagnosis not present

## 2024-03-27 DIAGNOSIS — D6869 Other thrombophilia: Secondary | ICD-10-CM

## 2024-03-27 MED ORDER — APIXABAN 5 MG PO TABS: 5.0000 mg | ORAL_TABLET | Freq: Two times a day (BID) | ORAL | 6 refills | Status: DC

## 2024-03-27 NOTE — Patient Instructions (Addendum)
 Cardioversion scheduled for: 04/09/24 Monday 8:00am    - Arrive at the [employer] [facility] ([address]  and check in with ADMITTING at 8:00 am    - Do not eat or drink anything after midnight the night prior to your procedure.   - Take all your morning medication (except diabetic medications) with a sip of water  prior to arrival.  - Do NOT miss any doses of your blood thinner - if you should miss a dose or take a dose more than 4 hours late -- please notify our office immediately.  - You will not be able to drive home after your procedure. Please ensure you have a responsible adult to drive you home. You will need someone with you for 24 hours post procedure.     - Expect to be in the procedural area approximately 2 hours.   - If you feel as if you go back into normal rhythm prior to scheduled cardioversion, please notify our office immediately.   If your procedure is canceled in the cardioversion suite you will be charged a cancellation fee.

## 2024-03-27 NOTE — Addendum Note (Signed)
 Encounter addended by: Janel Nancy SAUNDERS, RN on: 03/27/2024 2:55 PM  Actions taken: Order list changed

## 2024-03-27 NOTE — H&P (View-Only) (Signed)
 Primary Care Physician: Alphonsa Glendia LABOR, MD Primary Cardiologist: Jayson Sierras, MD Electrophysiologist: Danelle Birmingham, MD     Referring Physician: Device clinic     [name] is a 73 y.o. female with a history of sinus node dysfunction s/p PPM, morbid obesity, HTN, chronic diastolic HF, carotid artery stenosis, and atrial fibrillation who presents for consultation in the [facility]. Device clinic alert on 9/2 for new Afib; Dr. Sierras on 9/3 recommended patient to start Eliquis  and stop ASA. Patient is on Eliquis  5 mg BID for a CHADS2VASC score of 4.  On evaluation today, patient is currently in atrial flutter. She feels unwell out of rhythm and notes fatigue and intermittent dizziness. She has been compliant with Eliquis  for the past 10 days. Patient takes Seroquel  for bipolar disorder.  Today, she denies symptoms of chest pain, shortness of breath, orthopnea, PND, lower extremity edema, presyncope, syncope, snoring, daytime somnolence, bleeding, or neurologic sequela. The patient is tolerating medications without difficulties and is otherwise without complaint today.    she has a BMI of Body mass index is 47.86 kg/m.SABRA Filed Weights   03/27/24 1355  Weight: 126.5 kg    Current Outpatient Medications  Medication Sig Dispense Refill   acetaminophen  (TYLENOL ) 650 MG CR tablet Take 1,300 mg by mouth 2 (two) times daily.     ALPRAZolam  (XANAX  XR) 1 MG 24 hr tablet Take 1 mg by mouth in the morning and at bedtime. Taking once a day     ALPRAZolam  (XANAX ) 1 MG tablet Take 1 mg by mouth 2 (two) times daily as needed for anxiety.     apixaban  (ELIQUIS ) 5 MG TABS tablet Take 1 tablet (5 mg total) by mouth 2 (two) times daily. 56 tablet 0   bisoprolol  (ZEBETA ) 5 MG tablet Take 0.5 tablets (2.5 mg total) by mouth daily. 45 tablet 3   chlorthalidone  (HYGROTON ) 25 MG tablet Take 12.5 mg by mouth daily.     Cholecalciferol (VITAMIN D3) 1.25 MG (50000 UT)  CAPS Take 1 capsule by mouth once a week.     glucosamine-chondroitin 500-400 MG tablet Take 2 tablets by mouth every morning.      lamoTRIgine  (LAMICTAL ) 200 MG tablet Take 400 mg by mouth at bedtime.     Lutein 20 MG CAPS Take 20 mg by mouth every morning.     Multiple Vitamin (MULTIVITAMIN) tablet Take 1 tablet by mouth daily. Senior     QUEtiapine  (SEROQUEL ) 200 MG tablet Take 800 mg by mouth at bedtime. (Patient taking differently: Take 700 mg by mouth at bedtime.)     rosuvastatin  (CRESTOR ) 10 MG tablet TAKE 1 TABLET(10 MG) BY MOUTH DAILY 90 tablet 3   valACYclovir  (VALTREX ) 1000 MG tablet Take 1 tablet (1,000 mg total) by mouth 3 (three) times daily for 7 days. 21 tablet 0   No current facility-administered medications for this encounter.    Atrial Fibrillation Management history:  Previous antiarrhythmic drugs: none Previous cardioversions: none Previous ablations: none Anticoagulation history: Eliquis    ROS- All systems are reviewed and negative except as per the HPI above.  Physical Exam: BP 118/70   Pulse 64   Ht 5' 4 (1.626 m)   Wt 126.5 kg   BMI 47.86 kg/m   GEN: Well nourished, well developed in no acute distress NECK: No JVD; No carotid bruits CARDIAC: Irregularly irregular rate and rhythm, no murmurs, rubs, gallops RESPIRATORY:  Clear to auscultation without rales, wheezing or rhonchi  ABDOMEN:  Soft, non-tender, non-distended EXTREMITIES:  No edema; No deformity   EKG today demonstrates  Vent. rate 64 BPM PR interval * ms QRS duration 78 ms QT/QTcB 388/400 ms P-R-T axes * -27 -33 Atrial flutter with variable A-V block Minimal voltage criteria for LVH, may be normal variant ( R in aVL ) Possible Anterior infarct , age undetermined T wave abnormality, consider lateral ischemia Abnormal ECG When compared with ECG of 22-Sep-2023 16:03, PREVIOUS ECG IS PRESENT  Echo 03/17/23 demonstrated  1. Left ventricular ejection fraction, by estimation, is 60 to 65%.  Left  ventricular ejection fraction by 2D MOD biplane is 61.1 %. The left  ventricle has normal function. The left ventricle has no regional wall  motion abnormalities. There is mild left  ventricular hypertrophy. Indeterminate diastolic filling due to E-A  fusion.   2. Right ventricular systolic function is normal. The right ventricular  size is normal. There is normal pulmonary artery systolic pressure. The  estimated right ventricular systolic pressure is 29.6 mmHg.   3. The mitral valve is grossly normal. No evidence of mitral valve  regurgitation. No evidence of mitral stenosis.   4. The aortic valve is tricuspid. Aortic valve regurgitation is not  visualized. No aortic stenosis is present.   5. The inferior vena cava is normal in size with greater than 50%  respiratory variability, suggesting right atrial pressure of 3 mmHg.   ASSESSMENT & PLAN CHA2DS2-VASc Score = 4  The patient's score is based upon: CHF History: 1 HTN History: 1 Diabetes History: 0 Stroke History: 0 Vascular Disease History: 0 Age Score: 1 Gender Score: 1       ASSESSMENT AND PLAN: Persistent Atrial Fibrillation (ICD10:  I48.19) / Atrial flutter The patient's CHA2DS2-VASc score is 4, indicating a 4.8% annual risk of stroke.    Patient is currently in atrial flutter. Education provided about Afib. Discussion about cardioversion procedure. We discussed the procedure cardioversion to try to convert to NSR. We discussed the risks vs benefits of this procedure and how ultimately we cannot predict whether a patient will have early return of arrhythmia post procedure. After discussion, the patient wishes to proceed with cardioversion. Labs drawn today. We did briefly discuss going forward if there is ERAF it may be difficult to consider AAD therapy as there is significant interaction between them and Seroquel ; her BMI is typically considered prohibitive of ablation.   Informed Consent   Shared Decision  Making/Informed Consent The risks (stroke, cardiac arrhythmias rarely resulting in the need for a temporary or permanent pacemaker, skin irritation or burns and complications associated with conscious sedation including aspiration, arrhythmia, respiratory failure and death), benefits (restoration of normal sinus rhythm) and alternatives of a direct current cardioversion were explained in detail to Ms. Web and she agrees to proceed.       Secondary Hypercoagulable State (ICD10:  D68.69) The patient is at significant risk for stroke/thromboembolism based upon her CHA2DS2-VASc Score of 4.  Continue Apixaban  (Eliquis ).  Continue Eliquis . No missed doses.     Follow up 2 weeks after DCCV.   Terra Pac, [facility]  [facility] [address] [place], [place] [address] [phone]

## 2024-03-27 NOTE — Progress Notes (Signed)
 Primary Care Physician: Alphonsa Glendia LABOR, MD Primary Cardiologist: Jayson Sierras, MD Electrophysiologist: Danelle Birmingham, MD     Referring Physician: Device clinic     [name] is a 73 y.o. female with a history of sinus node dysfunction s/p PPM, morbid obesity, HTN, chronic diastolic HF, carotid artery stenosis, and atrial fibrillation who presents for consultation in the [facility]. Device clinic alert on 9/2 for new Afib; Dr. Sierras on 9/3 recommended patient to start Eliquis  and stop ASA. Patient is on Eliquis  5 mg BID for a CHADS2VASC score of 4.  On evaluation today, patient is currently in atrial flutter. She feels unwell out of rhythm and notes fatigue and intermittent dizziness. She has been compliant with Eliquis  for the past 10 days. Patient takes Seroquel  for bipolar disorder.  Today, she denies symptoms of chest pain, shortness of breath, orthopnea, PND, lower extremity edema, presyncope, syncope, snoring, daytime somnolence, bleeding, or neurologic sequela. The patient is tolerating medications without difficulties and is otherwise without complaint today.    she has a BMI of Body mass index is 47.86 kg/m.SABRA Filed Weights   03/27/24 1355  Weight: 126.5 kg    Current Outpatient Medications  Medication Sig Dispense Refill   acetaminophen  (TYLENOL ) 650 MG CR tablet Take 1,300 mg by mouth 2 (two) times daily.     ALPRAZolam  (XANAX  XR) 1 MG 24 hr tablet Take 1 mg by mouth in the morning and at bedtime. Taking once a day     ALPRAZolam  (XANAX ) 1 MG tablet Take 1 mg by mouth 2 (two) times daily as needed for anxiety.     apixaban  (ELIQUIS ) 5 MG TABS tablet Take 1 tablet (5 mg total) by mouth 2 (two) times daily. 56 tablet 0   bisoprolol  (ZEBETA ) 5 MG tablet Take 0.5 tablets (2.5 mg total) by mouth daily. 45 tablet 3   chlorthalidone  (HYGROTON ) 25 MG tablet Take 12.5 mg by mouth daily.     Cholecalciferol (VITAMIN D3) 1.25 MG (50000 UT)  CAPS Take 1 capsule by mouth once a week.     glucosamine-chondroitin 500-400 MG tablet Take 2 tablets by mouth every morning.      lamoTRIgine  (LAMICTAL ) 200 MG tablet Take 400 mg by mouth at bedtime.     Lutein 20 MG CAPS Take 20 mg by mouth every morning.     Multiple Vitamin (MULTIVITAMIN) tablet Take 1 tablet by mouth daily. Senior     QUEtiapine  (SEROQUEL ) 200 MG tablet Take 800 mg by mouth at bedtime. (Patient taking differently: Take 700 mg by mouth at bedtime.)     rosuvastatin  (CRESTOR ) 10 MG tablet TAKE 1 TABLET(10 MG) BY MOUTH DAILY 90 tablet 3   valACYclovir  (VALTREX ) 1000 MG tablet Take 1 tablet (1,000 mg total) by mouth 3 (three) times daily for 7 days. 21 tablet 0   No current facility-administered medications for this encounter.    Atrial Fibrillation Management history:  Previous antiarrhythmic drugs: none Previous cardioversions: none Previous ablations: none Anticoagulation history: Eliquis    ROS- All systems are reviewed and negative except as per the HPI above.  Physical Exam: BP 118/70   Pulse 64   Ht 5' 4 (1.626 m)   Wt 126.5 kg   BMI 47.86 kg/m   GEN: Well nourished, well developed in no acute distress NECK: No JVD; No carotid bruits CARDIAC: Irregularly irregular rate and rhythm, no murmurs, rubs, gallops RESPIRATORY:  Clear to auscultation without rales, wheezing or rhonchi  ABDOMEN:  Soft, non-tender, non-distended EXTREMITIES:  No edema; No deformity   EKG today demonstrates  Vent. rate 64 BPM PR interval * ms QRS duration 78 ms QT/QTcB 388/400 ms P-R-T axes * -27 -33 Atrial flutter with variable A-V block Minimal voltage criteria for LVH, may be normal variant ( R in aVL ) Possible Anterior infarct , age undetermined T wave abnormality, consider lateral ischemia Abnormal ECG When compared with ECG of 22-Sep-2023 16:03, PREVIOUS ECG IS PRESENT  Echo 03/17/23 demonstrated  1. Left ventricular ejection fraction, by estimation, is 60 to 65%.  Left  ventricular ejection fraction by 2D MOD biplane is 61.1 %. The left  ventricle has normal function. The left ventricle has no regional wall  motion abnormalities. There is mild left  ventricular hypertrophy. Indeterminate diastolic filling due to E-A  fusion.   2. Right ventricular systolic function is normal. The right ventricular  size is normal. There is normal pulmonary artery systolic pressure. The  estimated right ventricular systolic pressure is 29.6 mmHg.   3. The mitral valve is grossly normal. No evidence of mitral valve  regurgitation. No evidence of mitral stenosis.   4. The aortic valve is tricuspid. Aortic valve regurgitation is not  visualized. No aortic stenosis is present.   5. The inferior vena cava is normal in size with greater than 50%  respiratory variability, suggesting right atrial pressure of 3 mmHg.   ASSESSMENT & PLAN CHA2DS2-VASc Score = 4  The patient's score is based upon: CHF History: 1 HTN History: 1 Diabetes History: 0 Stroke History: 0 Vascular Disease History: 0 Age Score: 1 Gender Score: 1       ASSESSMENT AND PLAN: Persistent Atrial Fibrillation (ICD10:  I48.19) / Atrial flutter The patient's CHA2DS2-VASc score is 4, indicating a 4.8% annual risk of stroke.    Patient is currently in atrial flutter. Education provided about Afib. Discussion about cardioversion procedure. We discussed the procedure cardioversion to try to convert to NSR. We discussed the risks vs benefits of this procedure and how ultimately we cannot predict whether a patient will have early return of arrhythmia post procedure. After discussion, the patient wishes to proceed with cardioversion. Labs drawn today. We did briefly discuss going forward if there is ERAF it may be difficult to consider AAD therapy as there is significant interaction between them and Seroquel ; her BMI is typically considered prohibitive of ablation.   Informed Consent   Shared Decision  Making/Informed Consent The risks (stroke, cardiac arrhythmias rarely resulting in the need for a temporary or permanent pacemaker, skin irritation or burns and complications associated with conscious sedation including aspiration, arrhythmia, respiratory failure and death), benefits (restoration of normal sinus rhythm) and alternatives of a direct current cardioversion were explained in detail to Ms. Web and she agrees to proceed.       Secondary Hypercoagulable State (ICD10:  D68.69) The patient is at significant risk for stroke/thromboembolism based upon her CHA2DS2-VASc Score of 4.  Continue Apixaban  (Eliquis ).  Continue Eliquis . No missed doses.     Follow up 2 weeks after DCCV.   Terra Pac, [facility]  [facility] [address] [place], [place] [address] [phone]

## 2024-03-28 ENCOUNTER — Telehealth: Payer: Self-pay | Admitting: Pharmacy Technician

## 2024-03-28 ENCOUNTER — Ambulatory Visit (HOSPITAL_COMMUNITY): Payer: Self-pay | Admitting: Internal Medicine

## 2024-03-28 ENCOUNTER — Other Ambulatory Visit (HOSPITAL_COMMUNITY): Payer: Self-pay

## 2024-03-28 LAB — CBC
Hematocrit: 39.1 % (ref 34.0–46.6)
Hemoglobin: 13.1 g/dL (ref 11.1–15.9)
MCH: 32.6 pg (ref 26.6–33.0)
MCHC: 33.5 g/dL (ref 31.5–35.7)
MCV: 97 fL (ref 79–97)
Platelets: 258 x10E3/uL (ref 150–450)
RBC: 4.02 x10E6/uL (ref 3.77–5.28)
RDW: 12.9 % (ref 11.7–15.4)
WBC: 8.4 x10E3/uL (ref 3.4–10.8)

## 2024-03-28 LAB — BASIC METABOLIC PANEL WITH GFR
BUN/Creatinine Ratio: 17 (ref 12–28)
BUN: 18 mg/dL (ref 8–27)
CO2: 27 mmol/L (ref 20–29)
Calcium: 10.3 mg/dL (ref 8.7–10.3)
Chloride: 98 mmol/L (ref 96–106)
Creatinine, Ser: 1.09 mg/dL — ABNORMAL HIGH (ref 0.57–1.00)
Glucose: 137 mg/dL — ABNORMAL HIGH (ref 70–99)
Potassium: 4.1 mmol/L (ref 3.5–5.2)
Sodium: 141 mmol/L (ref 134–144)
eGFR: 54 mL/min/1.73 — ABNORMAL LOW (ref >59)

## 2024-03-28 NOTE — Telephone Encounter (Signed)
 From staff    We are unable to give a test claim since she just got it on 03/15/24. Too soon until 03/28/24

## 2024-03-29 DIAGNOSIS — E119 Type 2 diabetes mellitus without complications: Secondary | ICD-10-CM | POA: Diagnosis not present

## 2024-03-29 DIAGNOSIS — E7849 Other hyperlipidemia: Secondary | ICD-10-CM | POA: Diagnosis not present

## 2024-03-30 ENCOUNTER — Ambulatory Visit: Payer: Self-pay | Admitting: Family Medicine

## 2024-03-30 LAB — LIPID PANEL
Chol/HDL Ratio: 2.9 ratio (ref 0.0–4.4)
Cholesterol, Total: 114 mg/dL (ref 100–199)
HDL: 39 mg/dL — ABNORMAL LOW (ref >39)
LDL Chol Calc (NIH): 41 mg/dL (ref 0–99)
Triglycerides: 213 mg/dL — ABNORMAL HIGH (ref 0–149)
VLDL Cholesterol Cal: 34 mg/dL (ref 5–40)

## 2024-03-30 LAB — HEMOGLOBIN A1C
Est. average glucose Bld gHb Est-mCnc: 143 mg/dL
Hgb A1c MFr Bld: 6.6 % — ABNORMAL HIGH (ref 4.8–5.6)

## 2024-04-03 DIAGNOSIS — H3321 Serous retinal detachment, right eye: Secondary | ICD-10-CM | POA: Diagnosis not present

## 2024-04-03 DIAGNOSIS — H35071 Retinal telangiectasis, right eye: Secondary | ICD-10-CM | POA: Diagnosis not present

## 2024-04-03 DIAGNOSIS — H353211 Exudative age-related macular degeneration, right eye, with active choroidal neovascularization: Secondary | ICD-10-CM | POA: Diagnosis not present

## 2024-04-03 DIAGNOSIS — H43821 Vitreomacular adhesion, right eye: Secondary | ICD-10-CM | POA: Diagnosis not present

## 2024-04-03 DIAGNOSIS — H34232 Retinal artery branch occlusion, left eye: Secondary | ICD-10-CM | POA: Diagnosis not present

## 2024-04-03 DIAGNOSIS — H353132 Nonexudative age-related macular degeneration, bilateral, intermediate dry stage: Secondary | ICD-10-CM | POA: Diagnosis not present

## 2024-04-03 DIAGNOSIS — H33101 Unspecified retinoschisis, right eye: Secondary | ICD-10-CM | POA: Diagnosis not present

## 2024-04-03 DIAGNOSIS — H35371 Puckering of macula, right eye: Secondary | ICD-10-CM | POA: Diagnosis not present

## 2024-04-03 DIAGNOSIS — H35351 Cystoid macular degeneration, right eye: Secondary | ICD-10-CM | POA: Diagnosis not present

## 2024-04-04 ENCOUNTER — Emergency Department (HOSPITAL_COMMUNITY)
Admission: EM | Admit: 2024-04-04 | Discharge: 2024-04-04 | Disposition: A | Discharge status: Home / Self Care | Source: Ambulatory Visit | Attending: Emergency Medicine | Admitting: Emergency Medicine

## 2024-04-04 ENCOUNTER — Ambulatory Visit: Admitting: Family Medicine

## 2024-04-04 ENCOUNTER — Telehealth: Payer: Self-pay

## 2024-04-04 ENCOUNTER — Emergency Department (HOSPITAL_COMMUNITY)

## 2024-04-04 ENCOUNTER — Other Ambulatory Visit: Payer: Self-pay

## 2024-04-04 VITALS — BP 124/82 | HR 63 | Temp 97.7°F | Ht 64.0 in | Wt 271.0 lb

## 2024-04-04 DIAGNOSIS — I482 Chronic atrial fibrillation, unspecified: Secondary | ICD-10-CM | POA: Diagnosis not present

## 2024-04-04 DIAGNOSIS — Z950 Presence of cardiac pacemaker: Secondary | ICD-10-CM | POA: Insufficient documentation

## 2024-04-04 DIAGNOSIS — Z7901 Long term (current) use of anticoagulants: Secondary | ICD-10-CM | POA: Insufficient documentation

## 2024-04-04 DIAGNOSIS — I4892 Unspecified atrial flutter: Secondary | ICD-10-CM | POA: Insufficient documentation

## 2024-04-04 DIAGNOSIS — E119 Type 2 diabetes mellitus without complications: Secondary | ICD-10-CM

## 2024-04-04 DIAGNOSIS — R0789 Other chest pain: Secondary | ICD-10-CM | POA: Diagnosis not present

## 2024-04-04 DIAGNOSIS — I509 Heart failure, unspecified: Secondary | ICD-10-CM | POA: Insufficient documentation

## 2024-04-04 DIAGNOSIS — R072 Precordial pain: Secondary | ICD-10-CM | POA: Diagnosis present

## 2024-04-04 DIAGNOSIS — R079 Chest pain, unspecified: Secondary | ICD-10-CM | POA: Diagnosis not present

## 2024-04-04 LAB — CBC WITH DIFFERENTIAL/PLATELET
Abs Immature Granulocytes: 0.03 K/uL (ref 0.00–0.07)
Basophils Absolute: 0 K/uL (ref 0.0–0.1)
Basophils Relative: 0 %
Eosinophils Absolute: 0 K/uL (ref 0.0–0.5)
Eosinophils Relative: 0 %
HCT: 44.7 % (ref 36.0–46.0)
Hemoglobin: 14.6 g/dL (ref 12.0–15.0)
Immature Granulocytes: 0 %
Lymphocytes Relative: 26 %
Lymphs Abs: 2.1 K/uL (ref 0.7–4.0)
MCH: 32.2 pg (ref 26.0–34.0)
MCHC: 32.7 g/dL (ref 30.0–36.0)
MCV: 98.5 fL (ref 80.0–100.0)
Monocytes Absolute: 0.7 K/uL (ref 0.1–1.0)
Monocytes Relative: 9 %
Neutro Abs: 5 K/uL (ref 1.7–7.7)
Neutrophils Relative %: 65 %
Platelets: 308 K/uL (ref 150–400)
RBC: 4.54 MIL/uL (ref 3.87–5.11)
RDW: 13.6 % (ref 11.5–15.5)
WBC: 7.9 K/uL (ref 4.0–10.5)
nRBC: 0 % (ref 0.0–0.2)

## 2024-04-04 LAB — COMPREHENSIVE METABOLIC PANEL WITH GFR
ALT: 24 U/L (ref 0–44)
AST: 26 U/L (ref 15–41)
Albumin: 4.6 g/dL (ref 3.5–5.0)
Alkaline Phosphatase: 87 U/L (ref 38–126)
Anion gap: 14 (ref 5–15)
BUN: 19 mg/dL (ref 8–23)
CO2: 28 mmol/L (ref 22–32)
Calcium: 10.8 mg/dL — ABNORMAL HIGH (ref 8.9–10.3)
Chloride: 97 mmol/L — ABNORMAL LOW (ref 98–111)
Creatinine, Ser: 0.8 mg/dL (ref 0.44–1.00)
GFR, Estimated: >60 mL/min (ref >60)
Glucose, Bld: 138 mg/dL — ABNORMAL HIGH (ref 70–99)
Potassium: 3.7 mmol/L (ref 3.5–5.1)
Sodium: 139 mmol/L (ref 135–145)
Total Bilirubin: 0.9 mg/dL (ref 0.0–1.2)
Total Protein: 8.1 g/dL (ref 6.5–8.1)

## 2024-04-04 LAB — TROPONIN I (HIGH SENSITIVITY)
Troponin I (High Sensitivity): 5 ng/L (ref <18)
Troponin I (High Sensitivity): 6 ng/L (ref <18)

## 2024-04-04 LAB — LIPASE, BLOOD: Lipase: 28 U/L (ref 11–51)

## 2024-04-04 LAB — BRAIN NATRIURETIC PEPTIDE: B Natriuretic Peptide: 28 pg/mL (ref 0.0–100.0)

## 2024-04-04 MED ORDER — ESOMEPRAZOLE MAGNESIUM 40 MG PO CPDR: 40.0000 mg | DELAYED_RELEASE_CAPSULE | Freq: Every day | ORAL | 0 refills | Status: AC

## 2024-04-04 MED ORDER — ONDANSETRON 4 MG PO TBDP: 4.0000 mg | ORAL_TABLET | Freq: Three times a day (TID) | ORAL | 0 refills | Status: DC | PRN

## 2024-04-04 NOTE — Telephone Encounter (Signed)
 Copied from CRM #[id number]. Topic: General - Other >> Apr 04, 2024  4:11 PM Carla L wrote: Reason for CRM: Patient just got home from the hospital. Patient would like to express her gratitude to Dr. Alphonsa for his care and for taking her to the hospital. Patient states they are going to wait to do the cardioversion until Monday.   Sending as an FYI

## 2024-04-04 NOTE — ED Provider Notes (Signed)
 [name] EMERGENCY DEPARTMENT AT [facility] Provider Note   CSN: [id number] Arrival date & time: 04/04/24  1028     Patient presents with: Chest Pain   [name] is a 73 y.o. female.  She has a history of chest pain, CHF, A-fib, pacemaker.  It sounds like she has been in A-fib for 3 weeks.  Started on Eliquis .  He is awaiting schedule cardioversion next week.  For the last few days has been having difficulty sleeping at night, indigestion, chest pain, nausea.  Went to her PCP today who brought her here for further evaluation.  She said initially she was pretty asymptomatic with the A-fib.  No fevers or chills no diarrhea or urinary symptoms.  Last food or water  was 4 AM.  Has been compliant with her Eliquis    The history is provided by the patient.  Chest Pain Pain location:  Substernal area Pain quality: aching   Pain severity:  Moderate Onset quality:  Gradual Duration:  3 days Timing:  Constant Progression:  Unchanged Chronicity:  New Relieved by:  Nothing Worsened by:  Nothing Ineffective treatments:  None tried Associated symptoms: fatigue, heartburn and nausea   Associated symptoms: no cough, no diaphoresis, no fever, no shortness of breath and no vomiting        Prior to Admission medications   Medication Sig Start Date End Date Taking? Authorizing Provider  acetaminophen  (TYLENOL ) 650 MG CR tablet Take 1,300 mg by mouth 2 (two) times daily.    [provider]  ALPRAZolam  (XANAX  XR) 1 MG 24 hr tablet Take 1 mg by mouth in the morning and at bedtime. Taking once a day    [provider]  ALPRAZolam  (XANAX ) 1 MG tablet Take 1 mg by mouth 2 (two) times daily as needed for anxiety.    [provider]  apixaban  (ELIQUIS ) 5 MG TABS tablet Take 1 tablet (5 mg total) by mouth 2 (two) times daily. 03/27/24   Terra Fairy PARAS, PA-C  bisoprolol  (ZEBETA ) 5 MG tablet Take 0.5 tablets (2.5 mg total) by mouth daily. 10/25/23   Debera Jayson MATSU, MD  chlorthalidone  (HYGROTON ) 25 MG tablet Take 12.5 mg by mouth daily.    [provider]  Cholecalciferol (VITAMIN D3) 1.25 MG (50000 UT) CAPS Take 1 capsule by mouth once a week. 12/31/23   [provider]  glucosamine-chondroitin 500-400 MG tablet Take 2 tablets by mouth every morning.     [provider]  lamoTRIgine  (LAMICTAL ) 200 MG tablet Take 400 mg by mouth at bedtime.    [provider]  Lutein 20 MG CAPS Take 20 mg by mouth every morning. 01/28/21   [provider]  Multiple Vitamin (MULTIVITAMIN) tablet Take 1 tablet by mouth daily. Senior    [provider]  QUEtiapine  (SEROQUEL ) 200 MG tablet Take 800 mg by mouth at bedtime. Patient taking differently: Take 700 mg by mouth at bedtime. 02/26/23   [provider]  QUEtiapine  (SEROQUEL ) 200 MG tablet Take 200 mg by mouth 3 (three) times daily.    [provider]  rosuvastatin  (CRESTOR ) 10 MG tablet TAKE 1 TABLET(10 MG) BY MOUTH DAILY 04/13/23   Debera Jayson MATSU, MD    Allergies: Imipramine and Tricor [fenofibrate]    Review of Systems  Constitutional:  Positive for fatigue. Negative for diaphoresis and fever.  Eyes:  Negative for visual disturbance.  Respiratory:  Negative for cough and shortness of breath.   Cardiovascular:  Positive for  chest pain.  Gastrointestinal:  Positive for heartburn and nausea. Negative for vomiting.    Updated Vital Signs BP (!) 147/75   Pulse 66   Resp 12   SpO2 97%   Physical Exam Vitals and nursing note reviewed.  Constitutional:      General: She is not in acute distress.    Appearance: Normal appearance. She is well-developed.  HENT:     Head: Normocephalic and atraumatic.  Eyes:     Conjunctiva/sclera: Conjunctivae normal.  Cardiovascular:     Rate and Rhythm: Normal rate and regular rhythm.     Heart sounds: No murmur heard. Pulmonary:     Effort: Pulmonary effort is normal. No respiratory distress.      Breath sounds: Normal breath sounds. No stridor. No wheezing.  Abdominal:     Palpations: Abdomen is soft.     Tenderness: There is no abdominal tenderness. There is no guarding or rebound.  Musculoskeletal:        General: No tenderness or deformity. Normal range of motion.     Cervical back: Neck supple.  Skin:    General: Skin is warm and dry.  Neurological:     General: No focal deficit present.     Mental Status: She is alert.     GCS: GCS eye subscore is 4. GCS verbal subscore is 5. GCS motor subscore is 6.     (all labs ordered are listed, but only abnormal results are displayed) Labs Reviewed  COMPREHENSIVE METABOLIC PANEL WITH GFR - Abnormal; Notable for the following components:      Result Value   Chloride 97 (*)    Glucose, Bld 138 (*)    Calcium  10.8 (*)    All other components within normal limits  CBC WITH DIFFERENTIAL/PLATELET  LIPASE, BLOOD  BRAIN NATRIURETIC PEPTIDE  TROPONIN I (HIGH SENSITIVITY)  TROPONIN I (HIGH SENSITIVITY)    EKG: EKG Interpretation Date/Time:  Wednesday April 04 2024 10:35:46 EDT Ventricular Rate:  73 PR Interval:  218 QRS Duration:  131 QT Interval:  514 QTC Calculation: 518 R Axis:   43  Text Interpretation: Atrial flutter No significant change since prior 9/25 Confirmed by Towana Sharper [phone]) on 04/04/2024 10:38:03 AM  Radiology: [facility] Chest Port 1 View Result Date: 04/04/2024 CLINICAL DATA:  Chest pain, possible atrial flutter. EXAM: PORTABLE CHEST 1 VIEW COMPARISON:  02/23/2023, CT chest 06/16/2007. FINDINGS: Trachea is midline. Heart size normal. No pericardial effusion. Is a pacemaker lead tips are in the right atrium and right ventricle. Lungs are clear. No pleural fluid. IMPRESSION: No acute findings. Electronically Signed   By: Newell Eke M.D.   On: 04/04/2024 11:43     Procedures   Medications Ordered in the ED - No data to display  Clinical Course as of 04/04/24 1654  Wed Apr 04, 2024  1134 Chest  x-ray interpreted by me as no acute infiltrate.  Awaiting radiology reading. [MB]  1205 Reviewed case with Dr. Debera cardiology.  He felt with her rate not being elevated he would not be in a rush to cardiovert her.  Recommended she continue her appointment on Monday.  If she is so symptomatic and is unwilling to wait could consider cardioversion. [MB]  1321 Patient is agreeable to hold off until her cardiology appointment for cardioversion.  Heart rates remain mostly in the 90s.  Will start her on a PPI and some Zofran  [MB]    Clinical Course User Index [MB] Towana Sharper BROCKS, MD  Medical Decision Making Amount and/or Complexity of Data Reviewed Labs: ordered. Radiology: ordered.  Risk Prescription drug management.   This patient complains of chest discomfort, shortness of breath, nausea; this involves an extensive number of treatment Options and is a complaint that carries with it a high risk of complications and morbidity. The differential includes arrhythmia, ACS, GI reflux, pneumonia  I ordered, reviewed and interpreted labs, which included CBC normal, chemistries mildly elevated glucose, troponins flat, BNP normal, LFTs normal I ordered imaging studies which included chest x-ray and I independently    visualized and interpreted imaging which showed no acute findings Additional history obtained from PCP note today Previous records obtained and reviewed in epic including recent cardiology notes I consulted cardiology Dr. Debera and discussed lab and imaging findings and discussed disposition.  Cardiac monitoring reviewed, atrial flutter with controlled rate Social determinants considered, depression Critical Interventions: None  After the interventions stated above, I reevaluated the patient and found patient to symptomatically be feeling better but remains in flutter Admission and further testing considered, she is comfortable with cardiology  recommendations for following outpatient for her cardioversion.  Will treat symptomatically with PPI and nausea medication.  Return instructions discussed      Final diagnoses:  Atrial flutter, unspecified type (HCC)  Nonspecific chest pain    ED Discharge Orders     None          Towana Ozell BROCKS, MD 04/04/24 [phone]

## 2024-04-04 NOTE — ED Triage Notes (Signed)
 Pt was brought over here to the ED from Dr. Eulogio office across the street via wheelchair due to concerns of pt being in atrial flutter. Pt states she is supposed to have a scheduled cardioversion but states I feel like I can't wait any longer endorses chest pain and tightness rates pain 5/10

## 2024-04-04 NOTE — Progress Notes (Signed)
   Subjective:    Patient ID: [name], female    DOB: Dec 23, 1950, 73 y.o.   MRN: [id number]  HPI 6 month follow up has afib and a cardioaversion on Monday  Patient relates chest discomfort over the past 3 to 4 days feels like an aching sensation in the middle of her chest she was not sure if it could be reflux she states she started taking Tums it did not help she has a recent history of atrial flutter and she states she feels very weak and relates that she just does not feel good she denies any passing out Results for orders placed or performed during the hospital encounter of 03/27/24  CBC   Collection Time: 03/27/24  2:53 PM  Result Value Ref Range   WBC 8.4 3.4 - 10.8 x10E3/uL   RBC 4.02 3.77 - 5.28 x10E6/uL   Hemoglobin 13.1 11.1 - 15.9 g/dL   Hematocrit 60.8 65.9 - 46.6 %   MCV 97 79 - 97 fL   MCH 32.6 26.6 - 33.0 pg   MCHC 33.5 31.5 - 35.7 g/dL   RDW 87.0 88.2 - 84.5 %   Platelets 258 150 - 450 x10E3/uL  Basic Metabolic Panel (BMET)   Collection Time: 03/27/24  2:53 PM  Result Value Ref Range   Glucose 137 (H) 70 - 99 mg/dL   BUN 18 8 - 27 mg/dL   Creatinine, Ser 8.90 (H) 0.57 - 1.00 mg/dL   eGFR 54 (L) >40 fO/fpw/8.26   BUN/Creatinine Ratio 17 12 - 28   Sodium 141 134 - 144 mmol/L   Potassium 4.1 3.5 - 5.2 mmol/L   Chloride 98 96 - 106 mmol/L   CO2 27 20 - 29 mmol/L   Calcium  10.3 8.7 - 10.3 mg/dL    Pt is not feeling well c/o of heartburn, nausea and no appetite intermittently - has been taking tums which doesn't seem to help  We did discuss diabetes but the patient relates that at the moment she could do much better on her diet and I talked with her about metformin  but we would initiate this after her hospitalization/ER workup for chest discomfort and atrial flutter  Review of Systems     Objective:   Physical Exam Heart is regular lungs are clear extremities no edema skin warm dry blood pressure on recheck good  EKG atrial flutter with some nonspecific  ST-T changes     Assessment & Plan:  Needs further workup to make sure there is not a ischemia related to the atrial flutter versus the even the possibility of a non-STEMI Patient appears stable but we will escort her directly to the ER ER doctor spoke with They will do further workup and consult cardiology accordingly We will address type 2 diabetes after hospital workup

## 2024-04-04 NOTE — Telephone Encounter (Signed)
 Will certainly be thinking about her

## 2024-04-04 NOTE — Discharge Instructions (Signed)
 Please continue your regular medications.  We are prescribing you some acid medicine and nausea medicine.  Keep your appointment with cardiology.  Return to the emergency department if any worsening or concerning symptoms

## 2024-04-06 ENCOUNTER — Ambulatory Visit: Payer: PPO

## 2024-04-06 ENCOUNTER — Other Ambulatory Visit (HOSPITAL_COMMUNITY): Payer: Self-pay

## 2024-04-06 DIAGNOSIS — I4819 Other persistent atrial fibrillation: Secondary | ICD-10-CM | POA: Diagnosis not present

## 2024-04-06 MED ORDER — APIXABAN 5 MG PO TABS: 5.0000 mg | ORAL_TABLET | Freq: Two times a day (BID) | ORAL | Status: AC

## 2024-04-06 NOTE — Progress Notes (Signed)
 Spoke to patient and instructed them to come at 0800  and to be NPO after 0000.     Confirmed that patient will have a ride home and someone to stay with them for 24 hours after the procedure.   Medications reviewed.  Confirmed blood thinner.  Confirmed no breaks in taking blood thinner for 3+ weeks prior to procedure.

## 2024-04-07 LAB — CUP PACEART REMOTE DEVICE CHECK
Date Time Interrogation Session: 20250926101528
Implantable Lead Connection Status: 753985
Implantable Lead Connection Status: 753985
Implantable Lead Implant Date: 20170627
Implantable Lead Implant Date: 20170627
Implantable Lead Location: 753859
Implantable Lead Location: 753860
Implantable Lead Model: 377
Implantable Lead Model: 377
Implantable Lead Serial Number: 49436227
Implantable Lead Serial Number: 49471106
Implantable Pulse Generator Implant Date: 20170627
Pulse Gen Model: 394969
Pulse Gen Serial Number: 68786455

## 2024-04-08 ENCOUNTER — Ambulatory Visit: Payer: Self-pay | Admitting: Internal Medicine

## 2024-04-08 NOTE — Anesthesia Preprocedure Evaluation (Signed)
 Anesthesia Evaluation  Patient identified by MRN, date of birth, ID band Patient awake    Reviewed: Allergy & Precautions, NPO status , Patient's Chart, lab work & pertinent test results, reviewed documented beta blocker date and time   History of Anesthesia Complications Negative for: history of anesthetic complications  Airway Mallampati: IV  TM Distance: >3 FB Neck ROM: Full    Dental  (+) Missing,    Pulmonary sleep apnea    Pulmonary exam normal        Cardiovascular hypertension, Pt. on medications and Pt. on home beta blockers +CHF  Normal cardiovascular exam+ dysrhythmias (on Eliquis ) Atrial Fibrillation + pacemaker   TTE 03/17/23: EF 60-65%, mild LVH, valves ok     Neuro/Psych   Anxiety Depression Bipolar Disorder      GI/Hepatic Neg liver ROS,GERD  Medicated,,  Endo/Other    Class 3 obesity  Renal/GU negative Renal ROS     Musculoskeletal  (+) Arthritis ,    Abdominal   Peds  Hematology negative hematology ROS (+)   Anesthesia Other Findings   Reproductive/Obstetrics                              Anesthesia Physical Anesthesia Plan  ASA: 3  Anesthesia Plan: General   Post-op Pain Management: Minimal or no pain anticipated   Induction: Intravenous  PONV Risk Score and Plan: Treatment may vary due to age or medical condition and Propofol  infusion  Airway Management Planned: Mask  Additional Equipment: None  Intra-op Plan:   Post-operative Plan:   Informed Consent: I have reviewed the patients History and Physical, chart, labs and discussed the procedure including the risks, benefits and alternatives for the proposed anesthesia with the patient or authorized representative who has indicated his/her understanding and acceptance.       Plan Discussed with: CRNA  Anesthesia Plan Comments:          Anesthesia Quick Evaluation

## 2024-04-09 ENCOUNTER — Ambulatory Visit (HOSPITAL_COMMUNITY)
Admission: RE | Admit: 2024-04-09 | Discharge: 2024-04-09 | Disposition: A | Discharge status: Home / Self Care | Attending: Internal Medicine | Admitting: Internal Medicine

## 2024-04-09 ENCOUNTER — Ambulatory Visit (HOSPITAL_BASED_OUTPATIENT_CLINIC_OR_DEPARTMENT_OTHER): Payer: Self-pay | Admitting: Anesthesiology

## 2024-04-09 ENCOUNTER — Ambulatory Visit (HOSPITAL_COMMUNITY): Payer: Self-pay | Admitting: Anesthesiology

## 2024-04-09 ENCOUNTER — Other Ambulatory Visit: Payer: Self-pay

## 2024-04-09 ENCOUNTER — Encounter (HOSPITAL_COMMUNITY)
Admission: RE | Disposition: A | Discharge status: Home / Self Care | Payer: Self-pay | Source: Home / Self Care | Attending: Internal Medicine

## 2024-04-09 ENCOUNTER — Encounter (HOSPITAL_COMMUNITY): Payer: Self-pay | Admitting: Internal Medicine

## 2024-04-09 DIAGNOSIS — G4730 Sleep apnea, unspecified: Secondary | ICD-10-CM | POA: Insufficient documentation

## 2024-04-09 DIAGNOSIS — I110 Hypertensive heart disease with heart failure: Secondary | ICD-10-CM

## 2024-04-09 DIAGNOSIS — G4733 Obstructive sleep apnea (adult) (pediatric): Secondary | ICD-10-CM

## 2024-04-09 DIAGNOSIS — D6869 Other thrombophilia: Secondary | ICD-10-CM | POA: Diagnosis not present

## 2024-04-09 DIAGNOSIS — I509 Heart failure, unspecified: Secondary | ICD-10-CM | POA: Diagnosis not present

## 2024-04-09 DIAGNOSIS — Z6841 Body Mass Index (BMI) 40.0 and over, adult: Secondary | ICD-10-CM | POA: Diagnosis not present

## 2024-04-09 DIAGNOSIS — I4891 Unspecified atrial fibrillation: Secondary | ICD-10-CM | POA: Diagnosis not present

## 2024-04-09 DIAGNOSIS — Z7901 Long term (current) use of anticoagulants: Secondary | ICD-10-CM | POA: Diagnosis not present

## 2024-04-09 DIAGNOSIS — I4819 Other persistent atrial fibrillation: Secondary | ICD-10-CM

## 2024-04-09 DIAGNOSIS — I4892 Unspecified atrial flutter: Secondary | ICD-10-CM | POA: Insufficient documentation

## 2024-04-09 DIAGNOSIS — E66813 Obesity, class 3: Secondary | ICD-10-CM | POA: Diagnosis not present

## 2024-04-09 DIAGNOSIS — K219 Gastro-esophageal reflux disease without esophagitis: Secondary | ICD-10-CM | POA: Insufficient documentation

## 2024-04-09 DIAGNOSIS — I5032 Chronic diastolic (congestive) heart failure: Secondary | ICD-10-CM

## 2024-04-09 HISTORY — PX: CARDIOVERSION: EP1203

## 2024-04-09 SURGERY — CARDIOVERSION (CATH LAB)
Anesthesia: General

## 2024-04-09 MED ORDER — LIDOCAINE 2% (20 MG/ML) 5 ML SYRINGE
INTRAMUSCULAR | Status: DC | PRN
  Administered 2024-04-09: 60 mg via INTRAVENOUS

## 2024-04-09 MED ORDER — PROPOFOL 10 MG/ML IV BOLUS
INTRAVENOUS | Status: DC | PRN
  Administered 2024-04-09: 60 mg via INTRAVENOUS

## 2024-04-09 MED ORDER — SODIUM CHLORIDE 0.9 % IV SOLN: INTRAVENOUS | Status: DC

## 2024-04-09 SURGICAL SUPPLY — 1 items: PAD DEFIB RADIO PHYSIO CONN (PAD) ×2 IMPLANT

## 2024-04-09 NOTE — Transfer of Care (Signed)
 Immediate Anesthesia Transfer of Care Note  Patient: [name]  Procedure(s) Performed: CARDIOVERSION  Patient Location: PACU  Anesthesia Type:General  Level of Consciousness: awake and drowsy  Airway & Oxygen  Therapy: Patient Spontanous Breathing and Patient connected to face mask oxygen   Post-op Assessment: Report given to RN and Post -op Vital signs reviewed and stable  Post vital signs: Reviewed and stable  Last Vitals:  Vitals Value Taken Time  BP 106/69 04/09/24 09:11  Temp    Pulse 60 04/09/24 09:11  Resp 18 04/09/24 09:11  SpO2 100 % 04/09/24 09:11  Vitals shown include unfiled device data.  Last Pain:  Vitals:   04/09/24 0823  TempSrc:   PainSc: 0-No pain         Complications: There were no known notable events for this encounter.

## 2024-04-09 NOTE — Progress Notes (Signed)
 Remote PPM Transmission

## 2024-04-09 NOTE — CV Procedure (Signed)
    Electrical Cardioversion Procedure Note [name] [id number] 1950/09/02  Procedure: Electrical Cardioversion Indications:  Atrial Flutter  Time Out: Verified patient identification, verified procedure,medications/allergies/relevent history reviewed, required imaging and test results available.  Performed  Procedure Details  The patient was NPO after midnight. Anesthesia was administered at the beside  by Dr.Howze and team.  Cardioversion was done with synchronized biphasic defibrillation with AP pads with 200 Joules.  The patient converted to AP/VP rhythm The patient tolerated the procedure well.  IMPRESSION:  Successful cardioversion of atrial flutter   Stanly Leavens, MD FASE [facility] Cardiologist [facility]  [address] [place], [address] [place], [place] [address] [phone]  9:06 AM

## 2024-04-09 NOTE — Anesthesia Postprocedure Evaluation (Signed)
 Anesthesia Post Note  Patient: [name]  Procedure(s) Performed: CARDIOVERSION     Patient location during evaluation: PACU Anesthesia Type: General Level of consciousness: awake and alert Pain management: pain level controlled Vital Signs Assessment: post-procedure vital signs reviewed and stable Respiratory status: spontaneous breathing, nonlabored ventilation and respiratory function stable Cardiovascular status: blood pressure returned to baseline Postop Assessment: no apparent nausea or vomiting Anesthetic complications: no   There were no known notable events for this encounter.  Last Vitals:  Vitals:   04/09/24 0920 04/09/24 0930  BP: 116/74 112/74  Pulse: 60 64  Resp: (!) 21 19  Temp:    SpO2: 100% 96%    Last Pain:  Vitals:   04/09/24 0823  TempSrc:   PainSc: 0-No pain                 Vertell Row

## 2024-04-09 NOTE — Interval H&P Note (Signed)
 History and Physical Interval Note:  04/09/2024 8:50 AM  [name]  has presented today for surgery, with the diagnosis of AFIB.  The various methods of treatment have been discussed with the patient and family. After consideration of risks, benefits and other options for treatment, the patient has consented to  Procedure(s): CARDIOVERSION (N/A) as a surgical intervention.  The patient's history has been reviewed, patient examined, no change in status, stable for surgery.  I have reviewed the patient's chart and labs.  Questions were answered to the patient's satisfaction.     Charlena Haub A Giana Castner

## 2024-04-10 ENCOUNTER — Telehealth: Payer: Self-pay | Admitting: Internal Medicine

## 2024-04-10 NOTE — Telephone Encounter (Signed)
 I spoke with patient. She reports a pounding heart beat that comes and goes.  She has been sleeping a lot today and also has a headache. She does not know if her heart rate is elevated but it does not feel fast to her. She does not know how to send in a remote transmission.  Is taking Eliquis . Will ask device department to contact patient to assist with sending in transmission ED precautions reviewed with patient .

## 2024-04-10 NOTE — Telephone Encounter (Signed)
 Patient c/o Palpitations:  STAT if patient reporting lightheadedness, shortness of breath, or chest pain  How long have you had palpitations/irregular HR/ Afib? Are you having the symptoms now? 2-3 hours, and is still happening now  Are you currently experiencing lightheadedness, SOB or CP? no  Do you have a history of afib (atrial fibrillation) or irregular heart rhythm? yes  Have you checked your BP or HR? (document readings if available): 126/74  Are you experiencing any other symptoms? Headache   Patient states she had a cardioversion and she feels like her A-flutter is coming back. She says she has been having a pounding heart beat for about 2-3 hours. She says she also has a headache.

## 2024-04-11 NOTE — Telephone Encounter (Addendum)
 Patient has Biotronik device which updated last night 04/11/24 @ 03:02 AM with no alert triggered. Patient reports several hours after DCCV was completed she started having palpitations, dizziness, weakness and fatigue. Pt does have slurred speech and difficulty maintain conversation on phone as writer had to ask patient questions several times prior to response. Inquired if patient took an extra dose of xanax , patient denied, stated she doesn't take more than prescribed. Patient stated to writer she thought she should take another one, writer advised against taking a xanax  due to to difficulty staying awake currently.   Discussed with Jodie, [place] who advised patient needs to be seen in ER considering findings above. Patient advised to go to ER and NOT to drive herself. Patient voiced understanding and agreeable to plan. Someone was at location with patient who was going to take patient to ER.

## 2024-04-17 ENCOUNTER — Observation Stay (HOSPITAL_COMMUNITY)
Admission: EM | Admit: 2024-04-17 | Discharge: 2024-04-18 | Disposition: A | Discharge status: Home / Self Care | Attending: Internal Medicine | Admitting: Internal Medicine

## 2024-04-17 ENCOUNTER — Telehealth: Payer: Self-pay | Admitting: Cardiology

## 2024-04-17 ENCOUNTER — Encounter (HOSPITAL_COMMUNITY): Payer: Self-pay

## 2024-04-17 ENCOUNTER — Emergency Department (HOSPITAL_COMMUNITY)

## 2024-04-17 ENCOUNTER — Other Ambulatory Visit: Payer: Self-pay

## 2024-04-17 DIAGNOSIS — I480 Paroxysmal atrial fibrillation: Secondary | ICD-10-CM | POA: Diagnosis not present

## 2024-04-17 DIAGNOSIS — R9431 Abnormal electrocardiogram [ECG] [EKG]: Secondary | ICD-10-CM | POA: Insufficient documentation

## 2024-04-17 DIAGNOSIS — F319 Bipolar disorder, unspecified: Secondary | ICD-10-CM | POA: Insufficient documentation

## 2024-04-17 DIAGNOSIS — Z96653 Presence of artificial knee joint, bilateral: Secondary | ICD-10-CM | POA: Insufficient documentation

## 2024-04-17 DIAGNOSIS — R002 Palpitations: Secondary | ICD-10-CM | POA: Diagnosis not present

## 2024-04-17 DIAGNOSIS — I4891 Unspecified atrial fibrillation: Secondary | ICD-10-CM | POA: Diagnosis present

## 2024-04-17 DIAGNOSIS — Z950 Presence of cardiac pacemaker: Secondary | ICD-10-CM | POA: Insufficient documentation

## 2024-04-17 DIAGNOSIS — I10 Essential (primary) hypertension: Secondary | ICD-10-CM | POA: Diagnosis not present

## 2024-04-17 DIAGNOSIS — E782 Mixed hyperlipidemia: Secondary | ICD-10-CM | POA: Diagnosis not present

## 2024-04-17 DIAGNOSIS — Z79899 Other long term (current) drug therapy: Secondary | ICD-10-CM | POA: Diagnosis not present

## 2024-04-17 DIAGNOSIS — R531 Weakness: Secondary | ICD-10-CM | POA: Insufficient documentation

## 2024-04-17 DIAGNOSIS — Z853 Personal history of malignant neoplasm of breast: Secondary | ICD-10-CM | POA: Diagnosis not present

## 2024-04-17 DIAGNOSIS — R42 Dizziness and giddiness: Principal | ICD-10-CM | POA: Insufficient documentation

## 2024-04-17 DIAGNOSIS — I482 Chronic atrial fibrillation, unspecified: Secondary | ICD-10-CM

## 2024-04-17 DIAGNOSIS — I5032 Chronic diastolic (congestive) heart failure: Secondary | ICD-10-CM | POA: Diagnosis not present

## 2024-04-17 DIAGNOSIS — Z8679 Personal history of other diseases of the circulatory system: Secondary | ICD-10-CM

## 2024-04-17 DIAGNOSIS — K219 Gastro-esophageal reflux disease without esophagitis: Secondary | ICD-10-CM | POA: Insufficient documentation

## 2024-04-17 DIAGNOSIS — Z7901 Long term (current) use of anticoagulants: Secondary | ICD-10-CM | POA: Insufficient documentation

## 2024-04-17 DIAGNOSIS — R29818 Other symptoms and signs involving the nervous system: Secondary | ICD-10-CM | POA: Diagnosis not present

## 2024-04-17 DIAGNOSIS — I110 Hypertensive heart disease with heart failure: Secondary | ICD-10-CM | POA: Insufficient documentation

## 2024-04-17 LAB — COMPREHENSIVE METABOLIC PANEL WITH GFR
ALT: 23 U/L (ref 0–44)
AST: 29 U/L (ref 15–41)
Albumin: 4.5 g/dL (ref 3.5–5.0)
Alkaline Phosphatase: 87 U/L (ref 38–126)
Anion gap: 14 (ref 5–15)
BUN: 14 mg/dL (ref 8–23)
CO2: 27 mmol/L (ref 22–32)
Calcium: 10 mg/dL (ref 8.9–10.3)
Chloride: 101 mmol/L (ref 98–111)
Creatinine, Ser: 0.94 mg/dL (ref 0.44–1.00)
GFR, Estimated: >60 mL/min (ref >60)
Glucose, Bld: 105 mg/dL — ABNORMAL HIGH (ref 70–99)
Potassium: 3.9 mmol/L (ref 3.5–5.1)
Sodium: 141 mmol/L (ref 135–145)
Total Bilirubin: 0.4 mg/dL (ref 0.0–1.2)
Total Protein: 7.3 g/dL (ref 6.5–8.1)

## 2024-04-17 LAB — CBC
HCT: 39.7 % (ref 36.0–46.0)
Hemoglobin: 13 g/dL (ref 12.0–15.0)
MCH: 32.6 pg (ref 26.0–34.0)
MCHC: 32.7 g/dL (ref 30.0–36.0)
MCV: 99.5 fL (ref 80.0–100.0)
Platelets: 234 K/uL (ref 150–400)
RBC: 3.99 MIL/uL (ref 3.87–5.11)
RDW: 13.6 % (ref 11.5–15.5)
WBC: 5.2 K/uL (ref 4.0–10.5)
nRBC: 0 % (ref 0.0–0.2)

## 2024-04-17 LAB — URINALYSIS, ROUTINE W REFLEX MICROSCOPIC
Bilirubin Urine: NEGATIVE
Glucose, UA: NEGATIVE mg/dL
Hgb urine dipstick: NEGATIVE
Ketones, ur: NEGATIVE mg/dL
Leukocytes,Ua: NEGATIVE
Nitrite: NEGATIVE
Protein, ur: NEGATIVE mg/dL
Specific Gravity, Urine: 1.015 (ref 1.005–1.030)
pH: 7 (ref 5.0–8.0)

## 2024-04-17 LAB — MAGNESIUM: Magnesium: 2.2 mg/dL (ref 1.7–2.4)

## 2024-04-17 LAB — CBG MONITORING, ED: Glucose-Capillary: 133 mg/dL — ABNORMAL HIGH (ref 70–99)

## 2024-04-17 LAB — TROPONIN T, HIGH SENSITIVITY: Troponin T High Sensitivity: <15 ng/L (ref 0–19)

## 2024-04-17 MED ORDER — APIXABAN 5 MG PO TABS
5.0000 mg | ORAL_TABLET | Freq: Two times a day (BID) | ORAL | Status: DC
  Administered 2024-04-17 – 2024-04-18 (×2): 5 mg via ORAL
  Filled 2024-04-17 (×2): qty 1

## 2024-04-17 MED ORDER — ROSUVASTATIN CALCIUM 10 MG PO TABS
10.0000 mg | ORAL_TABLET | Freq: Every day | ORAL | Status: DC
Start: 2024-04-17 — End: 2024-04-18
  Administered 2024-04-17: 10 mg via ORAL
  Filled 2024-04-17: qty 1

## 2024-04-17 MED ORDER — PANTOPRAZOLE SODIUM 40 MG PO TBEC
80.0000 mg | DELAYED_RELEASE_TABLET | Freq: Every day | ORAL | Status: DC
  Filled 2024-04-17: qty 2

## 2024-04-17 MED ORDER — IOHEXOL 350 MG/ML SOLN
75.0000 mL | Freq: Once | INTRAVENOUS | Status: AC | PRN
Start: 2024-04-17 — End: 2024-04-17
  Administered 2024-04-17: 75 mL via INTRAVENOUS

## 2024-04-17 NOTE — Telephone Encounter (Signed)
 Pt c/o BP issue: STAT if pt c/o blurred vision, one-sided weakness or slurred speech.  STAT if BP is GREATER than 180/120 TODAY.  STAT if BP is LESS than 90/60 and SYMPTOMATIC TODAY  1. What is your BP concern? hypotension  2. Have you taken any BP medication today?no  3. What are your last 5 BP readings?104/70  4. Are you having any other symptoms (ex. Dizziness, headache, blurred vision, passed out)? Dizziness

## 2024-04-17 NOTE — Telephone Encounter (Signed)
 Spoke to pt who c/o dizziness. Pt stated that this started 2 days ago. Pt stated that she is unable to walk alone- she must hold onto someone or use a cane. Pt stated that bp today is 104/70. Pt stated that she has not taken Chlorthalidone  in 2.5-3 days. Pt stated that she had a cardioversion 1 week ago and went into NSR, then felt a thump in her chest at home and feels like she may have gone back into a fib.   Appointment made with BSABRA Qua on 10/8.    Please advise.

## 2024-04-17 NOTE — ED Provider Notes (Signed)
 [place] EMERGENCY DEPARTMENT AT [facility] Provider Note   CSN: [id number] Arrival date & time: 04/17/24  1219     Patient presents with: Dizziness   [name] is a 73 y.o. female.  {Add pertinent medical, surgical, social history, OB history to HPI:32947}  Dizziness Associated symptoms: weakness   Patient presents for dizziness.  Medical history includes atrial fibrillation, bipolar disorder, anxiety, CHF, HTN, HLD, IBS, arthritis, prediabetes, pacemaker placement.  She is on Eliquis .  She underwent cardioversion a week ago.  Patient reports a truncal weakness over the past 2 days.  This has caused her unsteadiness on her feet.  She has been ambulating with a cane.  Baseline, she ambulates without any assistive devices.  Patient reports recent softening of blood pressures.  Because of this, she stopped taking her chlorthalidone  for the past couple days.  She denies any other recent symptoms.  She denies any dizziness at rest.     Prior to Admission medications   Medication Sig Start Date End Date Taking? Authorizing Provider  acetaminophen  (TYLENOL ) 650 MG CR tablet Take 1,300 mg by mouth 2 (two) times daily.    [provider]  ALPRAZolam  (XANAX  XR) 1 MG 24 hr tablet Take 1 mg by mouth in the morning and at bedtime. Taking once a day    [provider]  apixaban  (ELIQUIS ) 5 MG TABS tablet Take 1 tablet (5 mg total) by mouth 2 (two) times daily. 03/27/24   Terra Fairy PARAS, PA-C  bisoprolol  (ZEBETA ) 5 MG tablet Take 0.5 tablets (2.5 mg total) by mouth daily. 10/25/23   Debera Jayson MATSU, MD  chlorthalidone  (HYGROTON ) 25 MG tablet Take 12.5 mg by mouth daily.    [provider]  Cholecalciferol (VITAMIN D3) 1.25 MG (50000 UT) CAPS Take 1 capsule by mouth once a week. 12/31/23   [provider]  esomeprazole  (NEXIUM ) 40 MG capsule Take 1 capsule (40 mg total) by mouth daily. 04/04/24   Butler, Michael C, MD  glucosamine-chondroitin  500-400 MG tablet Take 2 tablets by mouth every morning.     [provider]  lamoTRIgine  (LAMICTAL ) 200 MG tablet Take 400 mg by mouth at bedtime.    [provider]  Lutein 20 MG CAPS Take 20 mg by mouth every morning. 01/28/21   [provider]  Multiple Vitamin (MULTIVITAMIN) tablet Take 1 tablet by mouth daily. Senior    [provider]  ondansetron  (ZOFRAN -ODT) 4 MG disintegrating tablet Take 1 tablet (4 mg total) by mouth every 8 (eight) hours as needed. 04/04/24   Towana Ozell BROCKS, MD  QUEtiapine  (SEROQUEL ) 200 MG tablet Take 800 mg by mouth at bedtime. Patient taking differently: Take 600 mg by mouth at bedtime. 02/26/23   [provider]  rosuvastatin  (CRESTOR ) 10 MG tablet TAKE 1 TABLET(10 MG) BY MOUTH DAILY 04/13/23   Debera Jayson MATSU, MD    Allergies: Imipramine and Tricor [fenofibrate]    Review of Systems  Musculoskeletal:  Positive for gait problem.  Neurological:  Positive for weakness.  All other systems reviewed and are negative.   Updated Vital Signs BP 130/60   Pulse 75   Temp 98.6 F (37 C) (Oral)   Resp 18   Wt 122.5 kg   SpO2 96%   BMI 46.35 kg/m   Physical Exam Vitals and nursing note reviewed.  Constitutional:      General: She is not in acute distress.    Appearance: Normal appearance. She is well-developed. She  is not ill-appearing, toxic-appearing or diaphoretic.  HENT:     Head: Normocephalic and atraumatic.     Right Ear: External ear normal.     Left Ear: External ear normal.     Nose: Nose normal.     Mouth/Throat:     Mouth: Mucous membranes are moist.  Eyes:     Extraocular Movements: Extraocular movements intact.     Conjunctiva/sclera: Conjunctivae normal.  Cardiovascular:     Rate and Rhythm: Normal rate and regular rhythm.  Pulmonary:     Effort: Pulmonary effort is normal. No respiratory distress.  Abdominal:     General: There is no distension.     Palpations: Abdomen is soft.      Tenderness: There is no abdominal tenderness.  Musculoskeletal:        General: No swelling. Normal range of motion.     Cervical back: Normal range of motion and neck supple.  Skin:    General: Skin is warm and dry.     Coloration: Skin is not jaundiced or pale.  Neurological:     General: No focal deficit present.     Mental Status: She is alert and oriented to person, place, and time.     Cranial Nerves: No cranial nerve deficit.     Sensory: No sensory deficit.     Motor: No weakness.     Coordination: Coordination normal.  Psychiatric:        Mood and Affect: Mood normal.        Behavior: Behavior normal.     (all labs ordered are listed, but only abnormal results are displayed) Labs Reviewed  CBG MONITORING, ED - Abnormal; Notable for the following components:      Result Value   Glucose-Capillary 133 (*)    All other components within normal limits  CBC  COMPREHENSIVE METABOLIC PANEL WITH GFR  URINALYSIS, ROUTINE W REFLEX MICROSCOPIC    EKG: None  Radiology: No results found.  {Document cardiac monitor, telemetry assessment procedure when appropriate:32947} Procedures   Medications Ordered in the ED - No data to display    {Click here for ABCD2, HEART and other calculators REFRESH Note before signing:1}                              Medical Decision Making Amount and/or Complexity of Data Reviewed Labs: ordered. Radiology: ordered.  Risk Prescription drug management.   This patient presents to the ED for concern of ***, this involves an extensive number of treatment options, and is a complaint that carries with it a high risk of complications and morbidity.  The differential diagnosis includes ***   Co morbidities / Chronic conditions that complicate the patient evaluation  ***   Additional history obtained:  Additional history obtained from EMR External records from outside source obtained and reviewed including ***   Lab Tests:  I Ordered,  and personally interpreted labs.  The pertinent results include:  ***   Imaging Studies ordered:  I ordered imaging studies including ***  I independently visualized and interpreted imaging which showed *** I agree with the radiologist interpretation   Cardiac Monitoring: / EKG:  The patient was maintained on a cardiac monitor.  I personally viewed and interpreted the cardiac monitored which showed an underlying rhythm of: ***   Problem List / ED Course / Critical interventions / Medication management  Patient presenting for truncal weakness and gait unsteadiness over the  past 2 days.  She does feel like this was an abrupt change.  At baseline, she ambulates without any difficulty.  She has had recent blood pressures at home when checked at home.  She has stopped taking her chlorthalidone .  On arrival in the ED, she is normotensive.  She is well-appearing on exam.  She denies any dizziness while at rest.  She has no focal neurologic deficits.  When stood up, she has no orthostatic hypotension.  Workup was initiated.  Pacemaker interrogation revealed no recent events.*** I ordered medication including ***   Reevaluation of the patient after these medicines showed that the patient *** I have reviewed the patients home medicines and have made adjustments as needed   Consultations Obtained:  I requested consultation with the ***,  and discussed lab and imaging findings as well as pertinent plan - they recommend: ***   Social Determinants of Health:  ***   Test / Admission - Considered:  ***   {Document critical care time when appropriate  Document review of labs and clinical decision tools ie CHADS2VASC2, etc  Document your independent review of radiology images and any outside records  Document your discussion with family members, caretakers and with consultants  Document social determinants of health affecting pt's care  Document your decision making why or why not admission,  treatments were needed:32947:::1}   Final diagnoses:  None    ED Discharge Orders     None

## 2024-04-17 NOTE — Progress Notes (Signed)
 Patient was monitored by this RN during MRI scan due to the presence of a pacemaker. Cardiac rhythm was continuously monitored throughout the procedure. Prior to the start of the scan, the pacemaker was placed in MRI-safe mode by the MRI technician and pacemaker representative remotely. Following completion of the scan, the device was returned to its pre-MRI settings. Neurological status and orientation post-procedure were unchanged from baseline.  Pre-procedure Heart Rate (Prior to being placed in MRI safe mode): 62  Post-procedure Heart Rate (Once pacemaker is returned to baseline mode): 64

## 2024-04-17 NOTE — Progress Notes (Signed)
 mri

## 2024-04-17 NOTE — Progress Notes (Signed)
 Notified by Dr. Loni she spoke with EDP and recommendation is for cardiology consult at [name] in AM. Added to [facility] cardiology census and sent inbox message to the team covering there for the morning as FYI. If any acute issues arise please reach out to on call [facility] team.

## 2024-04-17 NOTE — Telephone Encounter (Signed)
 Spoke to patient who stated she did not go to ER. Pt urged to go to ER per providers recommendations and pt was in agreement to be taken via EMS/family member. Pt requested that appointment made with PA for 10/8 be canceled.

## 2024-04-17 NOTE — ED Notes (Signed)
 Pt transported to MRI

## 2024-04-17 NOTE — ED Triage Notes (Addendum)
 Pt reports she feels dizzy and off balance x days when she walks and has to hold onto a cane or someone.  Pt reports she had a cardioversion a week ago due to atrial flutter and wants to be sure this isn't an arrhythmia problem.

## 2024-04-17 NOTE — H&P (Incomplete)
 History and Physical    Patient: [name] FMW:[id number] DOB: 1951/01/14 DOA: 04/17/2024 DOS: the patient was seen and examined on 04/17/2024 PCP: Alphonsa Glendia LABOR, MD  Patient coming from: Home  Chief Complaint:  Chief Complaint  Patient presents with   Dizziness   HPI: [name] is a 73 y.o. female with medical history significant of hypertension, hyperlipidemia, atrial fibrillation on Eliquis , CHF, GERD, PPM placement who presents to the emergency department due to 2-day onset of generalized weakness and dizziness on ambulation that has resulted in being unsteady on her feet.  She now ambulates with a cane, though she did not require any assistive device on ambulation at baseline.  She endorsed having a cardioversion about a week ago and also states that her blood pressure has been soft, so she stopped taking her chlorthalidone  since last few days.  Patient denies chest pain, shortness of breath, dizziness at rest.  ED Course:  In the emergency department, she was hemodynamically stable.  Workup in the ED showed normal CBC and BMP except for blood glucose of 105.  Troponin x 1 was normal, magnesium  2.2, urinalysis was normal CT angiography of head and neck without and with contrast showed no LVO or proximal hemodynamically significant stenosis. MRI brain without contrast showed no acute abnormality Cardiologist (Dr. Loni) was consulted and recommended admitting patient here at EP with plan for cardiologist to consult on patient in the morning per EDP.  Hospitalist was asked to admit patient for further evaluation and management.  Review of Systems: Review of systems as noted in the HPI. All other systems reviewed and are negative.   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 Waddell   Sleep apnea    SOB (shortness of breath)    Past Surgical History:  Procedure Laterality Date   ABDOMINAL HYSTERECTOMY     APPENDECTOMY     BREAST BIOPSY Left 2011   BREAST BIOPSY  2008   BREAST LUMPECTOMY Left 2008   CARDIOVERSION N/A 04/09/2024   Procedure: CARDIOVERSION;  Surgeon: Santo Stanly LABOR, MD;  Location: [facility];  Service: Cardiovascular;  Laterality: N/A;   CATARACT EXTRACTION     CHOLECYSTECTOMY     COLONOSCOPY N/A 09/13/2013   Procedure: COLONOSCOPY;  Surgeon: Claudis RAYMOND Rivet, MD;  Location: [facility];  Service: Endoscopy;  Laterality: N/A;  730   COLONOSCOPY WITH PROPOFOL  N/A 07/23/2022   Procedure: COLONOSCOPY WITH PROPOFOL ;  Surgeon: Cindie Carlin POUR, DO;  Location: [facility];  Service: Endoscopy;  Laterality: N/A;  11:30 am   EP IMPLANTABLE DEVICE N/A 01/06/2016   Procedure: Pacemaker Implant;  Surgeon: Danelle LELON Birmingham, MD;  [facility] IN[facility] CV LAB;  Service: Cardiovascular;  Laterality: N/A;   ESOPHAGOGASTRODUODENOSCOPY N/A 01/17/2014   Procedure: ESOPHAGOGASTRODUODENOSCOPY (EGD);  Surgeon: Claudis RAYMOND Rivet, MD;  Location: [facility];  Service: Endoscopy;  Laterality: N/A;  245   EYE SURGERY     JOINT REPLACEMENT     >8 yrs ago   POLYPECTOMY  07/23/2022   Procedure: POLYPECTOMY;  Surgeon: Cindie Carlin POUR, DO;  Location: [facility];  Service: Endoscopy;;    RIGHT/LEFT HEART CATH AND CORONARY ANGIOGRAPHY N/A 01/01/2020   Procedure: RIGHT/LEFT HEART CATH AND CORONARY ANGIOGRAPHY;  Surgeon: Verlin Lonni BIRCH, MD;  Location: [facility] CV [facility];  Service: Cardiovascular;  Laterality: N/A;   TOTAL KNEE ARTHROPLASTY Bilateral right knee   2012, 2007    Social History:  reports that she has never smoked. She has never been exposed to tobacco smoke. She has never used smokeless tobacco. She reports that she does not drink alcohol and does not use drugs.   Allergies  Allergen Reactions   Imipramine Rash   Tricor [Fenofibrate] Other (See Comments)    Pain, aching    Family History  Problem Relation Age of Onset   Cancer Mother    Breast cancer Mother    Bipolar disorder Mother    Diabetes Mother    Depression Mother    Obesity Mother    Anxiety disorder Mother    Heart disease Mother    Vision loss Mother    Alcohol abuse Father    Hypertension Father    Alcoholism Father    Bipolar disorder Sister    Diabetes Sister    Heart disease Other      Prior to Admission medications   Medication Sig Start Date End Date Taking? Authorizing Provider  acetaminophen  (TYLENOL ) 650 MG CR tablet Take 1,300 mg by mouth 2 (two) times daily.    [provider]  ALPRAZolam  (XANAX  XR) 1 MG 24 hr tablet Take 1 mg by mouth in the morning and at bedtime. Taking once a day    [provider]  apixaban  (ELIQUIS ) 5 MG TABS tablet Take 1 tablet (5 mg total) by mouth 2 (two) times daily. 03/27/24   Terra Fairy PARAS, PA-C  bisoprolol  (ZEBETA ) 5 MG tablet Take 0.5 tablets (2.5 mg total) by mouth daily. 10/25/23   Debera Jayson MATSU, MD  chlorthalidone  (HYGROTON ) 25 MG tablet Take 12.5 mg by mouth daily.    [provider]  Cholecalciferol (VITAMIN D3) 1.25 MG (50000 UT) CAPS Take 1 capsule by mouth once a week. 12/31/23   [provider]  esomeprazole  (NEXIUM ) 40 MG capsule Take 1 capsule (40 mg total) by mouth daily. 04/04/24    Butler, Michael C, MD  glucosamine-chondroitin 500-400 MG tablet Take 2 tablets by mouth every morning.     [provider]  lamoTRIgine  (LAMICTAL ) 200 MG tablet Take 400 mg by mouth at bedtime.    [provider]  Lutein 20 MG CAPS Take 20 mg by mouth every morning. 01/28/21   [provider]  Multiple Vitamin (MULTIVITAMIN) tablet Take 1 tablet by mouth daily. Senior    [provider]  ondansetron  (ZOFRAN -ODT) 4 MG disintegrating tablet Take 1 tablet (4 mg total) by mouth every 8 (eight) hours as needed. 04/04/24   Towana Ozell BROCKS, MD  QUEtiapine  (SEROQUEL   XR) 400 MG 24 hr tablet Take 800 mg by mouth at bedtime. 04/06/24   [provider]  rosuvastatin  (CRESTOR ) 10 MG tablet TAKE 1 TABLET(10 MG) BY MOUTH DAILY 04/13/23   Debera Jayson MATSU, MD    Physical Exam: BP (!) 155/60 (BP Location: Right Arm)   Pulse (!) 59   Temp 97.7 F (36.5 C) (Oral)   Resp 17   Ht 5' 4 (1.626 m)   Wt 124 kg   SpO2 100%   BMI 46.92 kg/m   General: 73 y.o. year-old female well developed well nourished in no acute distress.  Alert and oriented x3. HEENT: NCAT, EOMI Neck: Supple, trachea medial Cardiovascular: Regular rate and rhythm with no rubs or gallops.  No thyromegaly or JVD noted.  No lower extremity edema. 2/4 pulses in all 4 extremities. Respiratory: Clear to auscultation with no wheezes or rales. Good inspiratory effort. Abdomen: Soft, nontender nondistended with normal bowel sounds x4 quadrants. Muskuloskeletal: No cyanosis, clubbing or edema noted bilaterally Neuro: CN II-XII intact, strength 5/5 x 4, sensation, reflexes intact Skin: No ulcerative lesions noted or rashes Psychiatry: Judgement and insight appear normal. Mood is appropriate for condition and setting          Labs on Admission:  Basic Metabolic Panel: Recent Labs  Lab 04/17/24 1320  NA 141  K 3.9  CL 101  CO2 27  GLUCOSE 105*  BUN 14  CREATININE 0.94  CALCIUM  10.0  MG 2.2    Liver Function Tests: Recent Labs  Lab 04/17/24 1320  AST 29  ALT 23  ALKPHOS 87  BILITOT 0.4  PROT 7.3  ALBUMIN 4.5   No results for input(s): LIPASE, AMYLASE in the last 168 hours. No results for input(s): AMMONIA in the last 168 hours. CBC: Recent Labs  Lab 04/17/24 1320  WBC 5.2  HGB 13.0  HCT 39.7  MCV 99.5  PLT 234   Cardiac Enzymes: No results for input(s): CKTOTAL, CKMB, CKMBINDEX, TROPONINI in the last 168 hours.  BNP (last 3 results) Recent Labs    04/04/24 1101  BNP 28.0    ProBNP (last 3 results) No results for input(s): PROBNP in the last 8760 hours.  CBG: Recent Labs  Lab 04/17/24 1311  GLUCAP 133*    Radiological Exams on Admission: CT ANGIO HEAD NECK W WO CM Result Date: 04/17/2024 EXAM: CTA HEAD AND NECK WITHOUT AND WITH IV CONTRAST 04/17/2024 03:08:14 PM TECHNIQUE: CTA of the head and neck was performed without and with the administration of 75mL iohexol  (OMNIPAQUE ) 350 MG/ML injection. Multiplanar 2D and/or 3D reformatted images are provided for review. Automated exposure control, iterative reconstruction, and/or weight based adjustment of the mA/kV was utilized to reduce the radiation dose to as low as reasonably achievable. Stenosis of the internal carotid arteries measured using NASCET criteria. COMPARISON: None available CLINICAL HISTORY: Neuro deficit, acute, stroke suspected; Pt reports she feels dizzy and off balance x days when she walks and has to hold onto a cane or someone. FINDINGS: AORTIC ARCH AND ARCH VESSELS: No dissection or arterial injury. No significant stenosis of the brachiocephalic or subclavian arteries. CERVICAL CAROTID ARTERIES: No dissection, arterial injury, or hemodynamically significant stenosis by NASCET criteria. CERVICAL VERTEBRAL ARTERIES: No dissection, arterial injury, or significant stenosis. LUNGS AND MEDIASTINUM: Unremarkable. SOFT TISSUES: No acute abnormality. BONES: No acute abnormality.  ANTERIOR CIRCULATION: No significant stenosis of the internal carotid arteries. No significant stenosis of the anterior cerebral arteries. No significant stenosis of the middle cerebral arteries.  No aneurysm. POSTERIOR CIRCULATION: No significant stenosis of the posterior cerebral arteries. No significant stenosis of the basilar artery. No significant stenosis of the vertebral arteries. No aneurysm. OTHER: No dural venous sinus thrombosis on this non-dedicated study. IMPRESSION: 1. No large vessel occlusion or proximal hemodynamically significant stenosis. Electronically signed by: Gilmore Molt MD 04/17/2024 05:50 PM EDT RP Workstation: [id number]   MR BRAIN WO CONTRAST Result Date: 04/17/2024 EXAM: MR Brain without Intravenous Contrast. CLINICAL HISTORY: Neuro deficit, acute, stroke suspected. TECHNIQUE: Magnetic resonance images of the brain without intravenous contrast in multiple planes. CONTRAST: Without. COMPARISON: None provided. FINDINGS: BRAIN: No restricted diffusion to indicate acute infarction. No intracranial mass or hemorrhage. No midline shift or extra-axial fluid collection. Major arterial flow voids are patent. VENTRICLES: No hydrocephalus. ORBITS: The orbits are normal. SINUSES AND MASTOIDS: The sinuses and mastoid air cells are clear. BONES: No acute fracture or focal osseous lesion. IMPRESSION: 1. No acute abnormality. Electronically signed by: Gilmore Molt MD 04/17/2024 05:46 PM EDT RP Workstation: [id number]    EKG: I independently viewed the EKG done and my findings are as followed: Atrial paced rhythm at rate of 63 bpm with prolonged AV conduction with nonspecific T wave abnormality.  Assessment/Plan Present on Admission:  Essential hypertension  Atrial fibrillation (HCC)  GERD (gastroesophageal reflux disease)  Bipolar disorder (HCC)  Principal Problem:   Dizziness Active Problems:   GERD (gastroesophageal reflux disease)   Bipolar disorder (HCC)   Essential  hypertension   Atrial fibrillation (HCC)   Abnormal EKG   Mixed hyperlipidemia   History of CHF (congestive heart failure)  Dizziness and weakness possibly due to abnormal EKG EKG personally reviewed showed Atrial paced rhythm at a rate of 63 bpm with prolonged AV conduction with nonspecific T wave abnormality. Patient had an ablation of atrial fibrillation about a week ago Continue telemetry Cardiologist on call (Dr. Loni) was consulted and recommended admitting patient to ED at [facility] with plan for cardiology to see her in the morning per EDP.  Essential hypertension (controlled) Patient stopped taking chlorthalidone  due to soft BP Hold other home BP meds Continue to monitor BP  Mixed hyperlipidemia Continue Crestor   Atrial fibrillation on Eliquis  Continue Eliquis  Bisoprolol  will be held at this time due to soft BP  GERD Continue PPI  History of CHF Patient appears euvolemic Continue total input/output, daily weights and fluid restriction Echocardiogram done on 03/17/2023 showed LVEF of 66 5%.  No WMA's.  Mild LVH.  Indeterminate LV diastolic parameters.    Bipolar disorder Lamotrigine  will be held at this time due to dizziness side effect profile  DVT prophylaxis: Lovenox   Code Status: Full code  Family Communication: None at bedside  Consults: Cardiology   Severity of Illness: The appropriate patient status for this patient is OBSERVATION. Observation status is judged to be reasonable and necessary in order to provide the required intensity of service to ensure the patient's safety. The patient's presenting symptoms, physical exam findings, and initial radiographic and laboratory data in the context of their medical condition is felt to place them at decreased risk for further clinical deterioration. Furthermore, it is anticipated that the patient will be medically stable for discharge from the hospital within 2 midnights of admission.   Author: Kelsey Durflinger,  DO 04/17/2024 9:27 PM  For on call review [email or web address].

## 2024-04-17 NOTE — Progress Notes (Signed)
  Device system confirmed  , to be MRI conditional, with implant date > 6 weeks ago, and no evidence of abandoned or epicardial leads in review of most recent CXR  Device last cleared by EP Provider: Dominique Laughter  04/18/2024    Clearance is good through for 1 year as long as parameters remain stable at time of check. If pt undergoes a cardiac device procedure during that time, they should be re-cleared.   Tachy-therapies to be programmed off if applicable with device back to pre-MRI settings after completion of exam.  [employer] was available remotely to assist in programming recommendations.   [name]  04/17/2024 2:55 PM

## 2024-04-18 ENCOUNTER — Ambulatory Visit: Admitting: Student

## 2024-04-18 DIAGNOSIS — Z7901 Long term (current) use of anticoagulants: Secondary | ICD-10-CM | POA: Diagnosis not present

## 2024-04-18 DIAGNOSIS — I4819 Other persistent atrial fibrillation: Secondary | ICD-10-CM | POA: Diagnosis not present

## 2024-04-18 DIAGNOSIS — R269 Unspecified abnormalities of gait and mobility: Secondary | ICD-10-CM | POA: Diagnosis not present

## 2024-04-18 DIAGNOSIS — I5032 Chronic diastolic (congestive) heart failure: Secondary | ICD-10-CM | POA: Diagnosis not present

## 2024-04-18 DIAGNOSIS — Z950 Presence of cardiac pacemaker: Secondary | ICD-10-CM

## 2024-04-18 DIAGNOSIS — R42 Dizziness and giddiness: Secondary | ICD-10-CM | POA: Diagnosis not present

## 2024-04-18 MED ORDER — ACETAMINOPHEN 325 MG PO TABS
650.0000 mg | ORAL_TABLET | Freq: Four times a day (QID) | ORAL | Status: DC
  Administered 2024-04-18: 650 mg via ORAL
  Filled 2024-04-18 (×2): qty 2

## 2024-04-18 MED ORDER — LAMOTRIGINE 100 MG PO TABS: 400.0000 mg | ORAL_TABLET | Freq: Every day | ORAL | Status: DC

## 2024-04-18 MED ORDER — QUETIAPINE FUMARATE ER 200 MG PO TB24
600.0000 mg | ORAL_TABLET | Freq: Every day | ORAL | Status: DC
  Filled 2024-04-18: qty 2

## 2024-04-18 MED ORDER — ALPRAZOLAM 1 MG PO TABS: 1.0000 mg | ORAL_TABLET | Freq: Every day | ORAL | Status: DC | PRN

## 2024-04-18 MED ORDER — VITAMIN D3 1.25 MG (50000 UT) PO CAPS: 1.0000 | ORAL_CAPSULE | ORAL | Status: DC

## 2024-04-18 MED ORDER — ALPRAZOLAM 1 MG PO TABS
1.0000 mg | ORAL_TABLET | Freq: Every day | ORAL | Status: DC
  Administered 2024-04-18: 1 mg via ORAL
  Filled 2024-04-18: qty 1

## 2024-04-18 MED ORDER — ADULT MULTIVITAMIN W/MINERALS CH
1.0000 | ORAL_TABLET | Freq: Every day | ORAL | Status: DC
  Administered 2024-04-18: 1 via ORAL
  Filled 2024-04-18: qty 1

## 2024-04-18 NOTE — Consult Note (Addendum)
 CARDIOLOGY CONSULT NOTE    Patient ID: [name]; [id number]; 09/26/1950   Admit date: 04/17/2024 Date of Consult: 04/18/2024  Primary Care Provider: Alphonsa Glendia LABOR, MD Primary Cardiologist:  Primary Electrophysiologist:    History of Present Illness:   [name] is a 73 year old F known to have sinus node dysfunction s/p PPM, A-fib s/p DCCV on 04/09/2024, chronic diastolic heart failure, presented to the ER with a chief complaint of unsteadiness.  Patient underwent cardioversion on 04/09/2024, converted to NSR, atrial paced rhythm.  Continues to be in atrial paced rhythm on telemetry.  She reports having room spinning sensation with positional changes.  Orthostatics were checked in the ER and was noted to have no BP drop with standing.  PPM was also interrogated and did not reveal any alerts.  Patient is seen and examined at the bedside today.  She reports that her unsteadiness got better and was able to walk to the bathroom with no issues.  Physical therapy evaluation is pending.  Does not have any other symptoms.  She underwent CT/MRI brain that did not reveal any acute pathologies.  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 Waddell   Sleep apnea    SOB (shortness of breath)     Past Surgical History:  Procedure Laterality Date   ABDOMINAL HYSTERECTOMY     APPENDECTOMY     BREAST BIOPSY Left 2011   BREAST BIOPSY  2008   BREAST LUMPECTOMY Left 2008   CARDIOVERSION N/A 04/09/2024   Procedure: CARDIOVERSION;  Surgeon: Santo Stanly LABOR, MD;  Location: [facility];  Service: Cardiovascular;  Laterality: N/A;   CATARACT EXTRACTION     CHOLECYSTECTOMY     COLONOSCOPY N/A 09/13/2013   Procedure: COLONOSCOPY;  Surgeon: Claudis RAYMOND Rivet, MD;  Location: [facility];  Service: Endoscopy;  Laterality: N/A;  730   COLONOSCOPY WITH PROPOFOL  N/A 07/23/2022   Procedure: COLONOSCOPY WITH PROPOFOL ;  Surgeon: Cindie Carlin POUR, DO;  Location: [facility];  Service: Endoscopy;  Laterality: N/A;  11:30 am   EP IMPLANTABLE DEVICE N/A 01/06/2016   Procedure: Pacemaker Implant;  Surgeon: Danelle LELON Waddell, MD;  Location: [facility] INVASIVE CV LAB;  Service: Cardiovascular;  Laterality: N/A;   ESOPHAGOGASTRODUODENOSCOPY N/A 01/17/2014   Procedure: ESOPHAGOGASTRODUODENOSCOPY (EGD);  Surgeon: Claudis RAYMOND Rivet, MD;  Location: [facility];  Service: Endoscopy;  Laterality: N/A;  245   EYE SURGERY     JOINT REPLACEMENT     >8 yrs ago  POLYPECTOMY  07/23/2022   Procedure: POLYPECTOMY;  Surgeon: Cindie Carlin POUR, DO;  Location: [facility];  Service: Endoscopy;;   RIGHT/LEFT HEART CATH AND CORONARY ANGIOGRAPHY N/A 01/01/2020   Procedure: RIGHT/LEFT HEART CATH AND CORONARY ANGIOGRAPHY;  Surgeon: Verlin Lonni BIRCH, MD;  Location: [facility] CV [facility];  Service: Cardiovascular;  Laterality: N/A;   TOTAL KNEE ARTHROPLASTY Bilateral right knee   2012, 2007       Inpatient Medications: Scheduled Meds:  acetaminophen   650 mg Oral QID   ALPRAZolam   1 mg Oral Daily   apixaban   5 mg Oral BID   multivitamin with minerals  1 tablet Oral Daily   pantoprazole   80 mg Oral Q1200   QUEtiapine   600 mg Oral QHS    rosuvastatin   10 mg Oral QHS   [START ON 04/22/2024] Vitamin D3  1 capsule Oral Q Sun   Continuous Infusions:  PRN Meds: ALPRAZolam   Allergies:    Allergies  Allergen Reactions   Imipramine Rash   Tricor [Fenofibrate] Other (See Comments)    Pain, aching    Social History:   Social History   Socioeconomic History   Marital status: Married    Spouse name: Not on file   Number of children: 3   Years of education: college   Highest education level: Associate degree: occupational, Scientist, product/process development, or vocational program  Occupational History   Occupation: Charity fundraiser   Occupation: Retired Charity fundraiser  Tobacco Use   Smoking status: Never    Passive exposure: Never   Smokeless tobacco: Never   Tobacco comments:    Never smoked 03/27/24  Vaping Use   Vaping status: Never Used  Substance and Sexual Activity   Alcohol use: No    Alcohol/week: 0.0 standard drinks of alcohol   Drug use: No   Sexual activity: Not Currently  Other Topics Concern   Not on file  Social History Narrative   04/24/2013 [name] [name] was born and grew up in [place] . She reports that her childhood was tough. She has 2 older sisters and a younger brother. She achieved an Associates Degree in nursing. She has been working as an Charity fundraiser for 40 years. She is separated from her husband for 6 years. She has 2 daughters and one son. She denies any legal difficulties. She affiliates as Curator. Her hobbies include reading, sewing, crafts, and cooking. She reports that her social support system consists of her friend and passed her. 04/24/2013 [name]      Lives alone.   Right-handed.   No caffeine use.   Social Drivers of Corporate investment banker Strain: Low Risk  (11/25/2023)   Overall Financial Resource Strain (CARDIA)    Difficulty of Paying Living Expenses: Not hard at all  Food Insecurity: No Food Insecurity (04/17/2024)   Hunger Vital Sign    Worried About Running Out of Food in the Last Year: Never true    Ran Out of  Food in the Last Year: Never true  Transportation Needs: No Transportation Needs (04/17/2024)   PRAPARE - Administrator, Civil Service (Medical): No    Lack of Transportation (Non-Medical): No  Physical Activity: Insufficiently Active (11/25/2023)   Exercise Vital Sign    Days of Exercise per Week: 3 days    Minutes of Exercise per Session: 30 min  Stress: No Stress Concern Present (11/25/2023)   [employer] of Occupational Health - Occupational Stress Questionnaire    Feeling of Stress : Not at all  Social Connections: Socially Integrated (04/17/2024)   Social Connection and Isolation Panel    Frequency of Communication with Friends and Family: More than three times a week    Frequency of Social Gatherings with Friends and Family: More than three times a week    Attends Religious Services: More than 4 times per year    Active Member of [employer] or Organizations: Yes    Attends Engineer, structural: More than 4 times per year    Marital Status: Married  Catering manager Violence: Not At Risk (04/17/2024)   Humiliation, Afraid, Rape, and Kick questionnaire    Fear of Current or Ex-Partner: No    Emotionally Abused: No    Physically Abused: No    Sexually Abused: No    Family History:    Family History  Problem Relation Age of Onset   Cancer Mother    Breast cancer Mother    Bipolar disorder Mother    Diabetes Mother    Depression Mother    Obesity Mother    Anxiety disorder Mother    Heart disease Mother    Vision loss Mother    Alcohol abuse Father    Hypertension Father    Alcoholism Father    Bipolar disorder Sister    Diabetes Sister    Heart disease Other      ROS:  Please see the history of present illness.  ROS  All other ROS reviewed and negative.     Physical Exam/Data:   Vitals:   04/18/24 0832 04/18/24 0856 04/18/24 0900 04/18/24 0903  BP: 96/68 (!) 127/59 134/69 126/70  Pulse: 62 85 86 81  Resp: 20 20    Temp: 97.6 F (36.4 C)  98.1 F (36.7 C)    TempSrc: Oral Oral    SpO2: 95% 92% 95% 97%  Weight:      Height:       No intake or output data in the 24 hours ending 04/18/24 1348 Filed Weights   04/17/24 1253 04/17/24 2100 04/18/24 0517  Weight: 122.5 kg 124 kg 123.4 kg   Body mass index is 46.7 kg/m.  General:  Well nourished, well developed, in no acute distress HEENT: normal Lymph: no adenopathy Neck: no JVD Endocrine:  No thryomegaly Vascular: No carotid bruits; FA pulses 2+ bilaterally without bruits  Cardiac:  normal S1, S2; RRR; no murmur  Lungs:  clear to auscultation bilaterally, no wheezing, rhonchi or rales  Abd: soft, nontender, no hepatomegaly  Ext: no edema Musculoskeletal:  No deformities, BUE and BLE strength normal and equal Skin: warm and dry  Neuro:  CNs 2-12 intact, no focal abnormalities noted Psych:  Normal affect   Laboratory Data:  Chemistry Recent Labs  Lab 04/17/24 1320  NA 141  K 3.9  CL 101  CO2 27  GLUCOSE 105*  BUN 14  CREATININE 0.94  CALCIUM  10.0  GFRNONAA >60  ANIONGAP 14    Recent Labs  Lab 04/17/24 1320  PROT 7.3  ALBUMIN 4.5  AST 29  ALT 23  ALKPHOS 87  BILITOT 0.4   Hematology Recent Labs  Lab 04/17/24 1320  WBC 5.2  RBC 3.99  HGB 13.0  HCT 39.7  MCV 99.5  MCH 32.6  MCHC 32.7  RDW 13.6  PLT 234   Cardiac EnzymesNo results for input(s): TROPONINI in the last 168 hours. No results for input(s): TROPIPOC in the last 168 hours.  BNPNo results for input(s): BNP, PROBNP in the last 168 hours.  DDimer No results for input(s): DDIMER in the last 168 hours.  Radiology/Studies:  CT ANGIO HEAD NECK W WO CM Result Date: 04/17/2024 EXAM: CTA HEAD AND NECK WITHOUT AND WITH IV CONTRAST 04/17/2024 03:08:14 PM TECHNIQUE: CTA of the head and neck was performed without and with the administration of 75mL iohexol  (OMNIPAQUE ) 350 MG/ML injection. Multiplanar 2D and/or 3D reformatted images are provided for review. Automated exposure  control, iterative reconstruction, and/or weight based adjustment of the mA/kV was utilized to reduce the radiation dose to as low as reasonably achievable. Stenosis of the internal carotid arteries measured using NASCET criteria. COMPARISON: None available CLINICAL HISTORY: Neuro deficit, acute, stroke suspected; Pt reports she feels dizzy and off balance x days when she walks and has to hold onto a cane or someone. FINDINGS: AORTIC ARCH AND ARCH VESSELS: No dissection or arterial injury. No significant stenosis of the brachiocephalic or subclavian arteries. CERVICAL CAROTID ARTERIES: No dissection, arterial injury, or hemodynamically significant stenosis by NASCET criteria. CERVICAL VERTEBRAL ARTERIES: No dissection, arterial injury, or significant stenosis. LUNGS AND MEDIASTINUM: Unremarkable. SOFT TISSUES: No acute abnormality. BONES: No acute abnormality. ANTERIOR CIRCULATION: No significant stenosis of the internal carotid arteries. No significant stenosis of the anterior cerebral arteries. No significant stenosis of the middle cerebral arteries. No aneurysm. POSTERIOR CIRCULATION: No significant stenosis of the posterior cerebral arteries. No significant stenosis of the basilar artery. No significant stenosis of the vertebral arteries. No aneurysm. OTHER: No dural venous sinus thrombosis on this non-dedicated study. IMPRESSION: 1. No large vessel occlusion or proximal hemodynamically significant stenosis. Electronically signed by: Gilmore Molt MD 04/17/2024 05:50 PM EDT RP Workstation: [id number]   MR BRAIN WO CONTRAST Result Date: 04/17/2024 EXAM: MR Brain without Intravenous Contrast. CLINICAL HISTORY: Neuro deficit, acute, stroke suspected. TECHNIQUE: Magnetic resonance images of the brain without intravenous contrast in multiple planes. CONTRAST: Without. COMPARISON: None provided. FINDINGS: BRAIN: No restricted diffusion to indicate acute infarction. No intracranial mass or hemorrhage. No midline  shift or extra-axial fluid collection. Major arterial flow voids are patent. VENTRICLES: No hydrocephalus. ORBITS: The orbits are normal. SINUSES AND MASTOIDS: The sinuses and mastoid air cells are clear. BONES: No acute fracture or focal osseous lesion. IMPRESSION: 1. No acute abnormality. Electronically signed by: Gilmore Molt MD 04/17/2024 05:46 PM EDT RP Workstation: [id number]    Assessment and Plan:   Gait abnormality/unsteadiness: Patient reported having unsteadiness a few days after cardioversion (that was done on 04/09/2024).  She also reported having room spinning sensation with positional changes.  Orthostatics were negative in the ER.  PPM was also interrogated in the ER that did not reveal any alerts.  On chlorthalidone  and bisoprolol  at home.  Patient self discontinued chlorthalidone  4 days prior to ER visit but was taking bisoprolol .  Currently on hold.  Strongly recommend physical therapy evaluation.  A-fib s/p DCCV: Bisoprolol  on hold.  Continue to hold bisoprolol .  Continue Eliquis  5 mg twice daily.  Continues to stay in atrial paced rhythm on telemetry.  No further cardiac workup at this time.  Chronic diastolic heart failure: Compensated.  Sinus node dysfunction s/p PPM: Diagnosed with A-fib on PPM interrogation previously.  She was sent to A-fib clinic directly.   [facility] will sign off.   Medication Recommendations: Hold chlorthalidone  and bisoprolol  Other recommendations (labs, testing, etc): Check blood pressures at home and follow-up with PCP for HTN management Follow up as an outpatient: Keep scheduled follow-up appointment with cardiology  40 minutes was spent in reviewing the prior records, specialist  notes, more than labs, discussion and documentation.  For questions or updates, please contact [facility] Please consult [email or web address] for contact info under Cardiology/STEMI.   Signed, Ansel Ferrall Priya Alesha Jaffee, MD 04/18/2024 1:48 PM

## 2024-04-18 NOTE — Progress Notes (Signed)
 PROGRESS NOTE  [name] FMW:[id number] DOB: 12-12-1950 DOA: 04/17/2024 PCP: Alphonsa Glendia LABOR, MD   LOS: 0 days   Brief narrative:  [name] is a 73 y.o. female with medical history significant of hypertension, hyperlipidemia, atrial fibrillation on Eliquis , CHF, GERD, status post PPM placement presented to hospital with 2-day onset of generalized weakness and dizziness on ambulation that has resulted in being unsteady on her feet.  She now ambulates with a cane, though she did not require any assistive device on ambulation at baseline.  She endorsed having cardioversion about a week ago and also states that her blood pressure has been soft, so she stopped taking her chlorthalidone  since last few days.  In the ED patient was hemodynamically stable.  Troponin was negative.  CT angiogram of the head and neck without any large vessel occlusion.  MRI of the brain without any acute abnormality.  Cardiology was consulted from the ED and plan for admission to the hospital for further evaluation.    Assessment/Plan: Principal Problem:   Dizziness Active Problems:   GERD (gastroesophageal reflux disease)   Bipolar disorder (HCC)   Essential hypertension   Atrial fibrillation (HCC)   Abnormal EKG   Mixed hyperlipidemia   History of CHF (congestive heart failure)  Dizziness and weakness  EKG showed d Atrial paced rhythm at a rate of 63 bpm with prolonged AV conduction with nonspecific T wave abnormality.  History of ablation for A-fib 1 week back.  Continue fall precautions, telemetry. Cardiologist on call (Dr. Loni) was consulted and recommended cardiology follow-up.  Will get PT OT evaluation for vestibular/gait evaluation.   Essential hypertension  Patient stopped taking chlorthalidone  due to soft BP.  Other antihypertensives on hold including bisoprolol  at this time.   Mixed hyperlipidemia Continue Crestor    Paroxysmal atrial fibrillation  Continue Eliquis , bisoprolol  on hold  due to borderline low blood pressure.   GERD Continue Protonix .   Chronic diastolic CHF Appears compensated.  Continue intake and output charting, daily weights and fluid restriction.  Review of previous 2D echocardiogram done from 03/17/2023 showed LV ejection fraction of 66%.  Bipolar disorder On nutrition.  Hold for now due to dizziness.  DVT prophylaxis:  apixaban  (ELIQUIS ) tablet 5 mg   Disposition: Likely home in 1 to 2 days  Status is: Observation  The patient will require care spanning > 2 midnights and should be moved to inpatient because: Cardiac evaluation, closer monitoring, pending clinical improvement    Code Status:     Code Status: Prior  Family Communication: Spoke with the patient's niece at bedside  Consultants: Cardiology  Procedures: None  Anti-infectives:  None  Anti-infectives (From admission, onward)    None       Subjective: Today, patient was seen and examined at bedside.  Patient denies any shortness of breath dyspnea or chest pain at this time.  Has not gotten out of the bed show on sure if she continues to have dizziness.  She had recent unsteadiness of her gait at home.  Objective: Vitals:   04/18/24 0900 04/18/24 0903  BP: 134/69 126/70  Pulse: 86 81  Resp:    Temp:    SpO2: 95% 97%   No intake or output data in the 24 hours ending 04/18/24 1317 Filed Weights   04/17/24 1253 04/17/24 2100 04/18/24 0517  Weight: 122.5 kg 124 kg 123.4 kg   Body mass index is 46.7 kg/m.   Physical Exam:  GENERAL: Patient is alert awake and oriented. Not  in obvious distress.  Obese built HENT: No scleral pallor or icterus. Pupils equally reactive to light. Oral mucosa is moist NECK: is supple, no gross swelling noted. CHEST: Clear to auscultation. No crackles or wheezes.  CVS: S1 and S2 heard, no murmur. Regular rate and rhythm.  ABDOMEN: Soft, non-tender, bowel sounds are present. EXTREMITIES: No edema. CNS: Cranial nerves are intact.  No focal motor deficits. SKIN: warm and dry without rashes.  Data Review: I have personally reviewed the following laboratory data and studies,  CBC: Recent Labs  Lab 04/17/24 1320  WBC 5.2  HGB 13.0  HCT 39.7  MCV 99.5  PLT 234   Basic Metabolic Panel: Recent Labs  Lab 04/17/24 1320  NA 141  K 3.9  CL 101  CO2 27  GLUCOSE 105*  BUN 14  CREATININE 0.94  CALCIUM  10.0  MG 2.2   Liver Function Tests: Recent Labs  Lab 04/17/24 1320  AST 29  ALT 23  ALKPHOS 87  BILITOT 0.4  PROT 7.3  ALBUMIN 4.5   No results for input(s): LIPASE, AMYLASE in the last 168 hours. No results for input(s): AMMONIA in the last 168 hours. Cardiac Enzymes: No results for input(s): CKTOTAL, CKMB, CKMBINDEX, TROPONINI in the last 168 hours. BNP (last 3 results) Recent Labs    04/04/24 1101  BNP 28.0    ProBNP (last 3 results) No results for input(s): PROBNP in the last 8760 hours.  CBG: Recent Labs  Lab 04/17/24 1311  GLUCAP 133*   No results found for this or any previous visit (from the past 240 hours).   Studies: CT ANGIO HEAD NECK W WO CM Result Date: 04/17/2024 EXAM: CTA HEAD AND NECK WITHOUT AND WITH IV CONTRAST 04/17/2024 03:08:14 PM TECHNIQUE: CTA of the head and neck was performed without and with the administration of 75mL iohexol  (OMNIPAQUE ) 350 MG/ML injection. Multiplanar 2D and/or 3D reformatted images are provided for review. Automated exposure control, iterative reconstruction, and/or weight based adjustment of the mA/kV was utilized to reduce the radiation dose to as low as reasonably achievable. Stenosis of the internal carotid arteries measured using NASCET criteria. COMPARISON: None available CLINICAL HISTORY: Neuro deficit, acute, stroke suspected; Pt reports she feels dizzy and off balance x days when she walks and has to hold onto a cane or someone. FINDINGS: AORTIC ARCH AND ARCH VESSELS: No dissection or arterial injury. No significant stenosis  of the brachiocephalic or subclavian arteries. CERVICAL CAROTID ARTERIES: No dissection, arterial injury, or hemodynamically significant stenosis by NASCET criteria. CERVICAL VERTEBRAL ARTERIES: No dissection, arterial injury, or significant stenosis. LUNGS AND MEDIASTINUM: Unremarkable. SOFT TISSUES: No acute abnormality. BONES: No acute abnormality. ANTERIOR CIRCULATION: No significant stenosis of the internal carotid arteries. No significant stenosis of the anterior cerebral arteries. No significant stenosis of the middle cerebral arteries. No aneurysm. POSTERIOR CIRCULATION: No significant stenosis of the posterior cerebral arteries. No significant stenosis of the basilar artery. No significant stenosis of the vertebral arteries. No aneurysm. OTHER: No dural venous sinus thrombosis on this non-dedicated study. IMPRESSION: 1. No large vessel occlusion or proximal hemodynamically significant stenosis. Electronically signed by: Gilmore Molt MD 04/17/2024 05:50 PM EDT RP Workstation: [id number]   MR BRAIN WO CONTRAST Result Date: 04/17/2024 EXAM: MR Brain without Intravenous Contrast. CLINICAL HISTORY: Neuro deficit, acute, stroke suspected. TECHNIQUE: Magnetic resonance images of the brain without intravenous contrast in multiple planes. CONTRAST: Without. COMPARISON: None provided. FINDINGS: BRAIN: No restricted diffusion to indicate acute infarction. No intracranial mass or hemorrhage. No midline  shift or extra-axial fluid collection. Major arterial flow voids are patent. VENTRICLES: No hydrocephalus. ORBITS: The orbits are normal. SINUSES AND MASTOIDS: The sinuses and mastoid air cells are clear. BONES: No acute fracture or focal osseous lesion. IMPRESSION: 1. No acute abnormality. Electronically signed by: Gilmore Molt MD 04/17/2024 05:46 PM EDT RP Workstation: [id number]      Vernal Alstrom, MD  [facility] 04/18/2024  If 7PM-7AM, please contact night-coverage

## 2024-04-18 NOTE — TOC CM/SW Note (Signed)
 Transition of Care [facility]) - Inpatient Brief Assessment   Patient Details  Name: [name] MRN: [id number] Date of Birth: 1951-04-12  Transition of Care [facility]) CM/SW Contact:    Lucie Lunger, LCSWA Phone Number: 04/18/2024, 9:26 AM   Clinical Narrative: Transition of Care Department [facility]) has reviewed patient and no TOC needs have been identified at this time. We will continue to monitor patient advancement through interdiciplinary progression rounds. If new patient transition needs arise, please place a TOC consult.  Transition of Care Asessment: Insurance and Status: Insurance coverage has been reviewed Patient has primary care physician: Yes Home environment has been reviewed: From home Prior level of function:: Independent Prior/Current Home Services: No current home services Social Drivers of Health Review: SDOH reviewed no interventions necessary Readmission risk has been reviewed: Yes Transition of care needs: no transition of care needs at this time

## 2024-04-18 NOTE — H&P (Incomplete)
 History and Physical    Patient: [name] FMW:[id number] DOB: 29-Jan-1951 DOA: 04/17/2024 DOS: the patient was seen and examined on 04/17/2024 PCP: Alphonsa Glendia LABOR, MD  Patient coming from: Home  Chief Complaint:  Chief Complaint  Patient presents with  . Dizziness   HPI: [name] is a 73 y.o. female with medical history significant of hypertension, hyperlipidemia, atrial fibrillation on Eliquis , CHF, GERD, PPM placement who presents to the emergency department due to 2-day onset of generalized weakness and dizziness on ambulation that has resulted in being unsteady on her feet.  She now ambulates with a cane, though she did not require any assistive device on ambulation at baseline.  She endorsed having a cardioversion about a week ago and also states that her blood pressure has been soft, so she stopped taking her chlorthalidone  since last few days.  Patient denies chest pain, shortness of breath, dizziness at rest.  ED Course:  In the emergency department, she was hemodynamically stable.  Workup in the ED showed normal CBC and BMP except for blood glucose of 105.  Troponin x 1 was normal, magnesium  2.2, urinalysis was normal CT angiography of head and neck without and with contrast showed no LVO or proximal hemodynamically significant stenosis. MRI brain without contrast showed no acute abnormality Cardiologist (Dr. Loni) was consulted and recommended admitting patient here at EP with plan for cardiologist to consult on patient in the morning per EDP.  Hospitalist was asked to admit patient for further evaluation and management.  Review of Systems: Review of systems as noted in the HPI. All other systems reviewed and are negative.   Past Medical History:  Diagnosis Date  . A-fib (HCC) 03/27/2024  . Allergy   . Anxiety   . Back pain   . Bipolar 1 disorder (HCC)   . Bradycardia, sinus   . Breast cancer (HCC)   . Breast cancer, left breast (HCC) 07/22/2011  . Cataract     Bil kpe 2 years ago  . 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 Chest pain   . CHF (congestive heart failure) (HCC)   . Constipation   . Depression   . Essential hypertension   . Hyperlipidemia   . IBS (irritable bowel syndrome)   . Macular degeneration   . Memory loss   . OA (osteoarthritis)   . Obesity   . Palpitations   . Personal history of chemotherapy 2009  . Personal history of radiation therapy 2009  . Polycystic ovarian disease   . Prediabetes   . Presence of permanent cardiac pacemaker   . Sinus node dysfunction (HCC) 12/2015   Biotronik pacemaker - Dr. Waddell  . Sleep apnea   . SOB (shortness of breath)    Past Surgical History:  Procedure Laterality Date  . ABDOMINAL HYSTERECTOMY    . APPENDECTOMY    . BREAST BIOPSY Left 2011  . BREAST BIOPSY  2008  . BREAST LUMPECTOMY Left 2008  . CARDIOVERSION N/A 04/09/2024   Procedure: CARDIOVERSION;  Surgeon: Santo Stanly LABOR, MD;  Location: [facility];  Service: Cardiovascular;  Laterality: N/A;  . CATARACT EXTRACTION    . CHOLECYSTECTOMY    . COLONOSCOPY N/A 09/13/2013   Procedure: COLONOSCOPY;  Surgeon: Claudis RAYMOND Rivet, MD;  Location: [facility];  Service: Endoscopy;  Laterality: N/A;  730  . COLONOSCOPY WITH PROPOFOL  N/A 07/23/2022   Procedure: COLONOSCOPY WITH PROPOFOL ;  Surgeon: Cindie Carlin POUR, DO;  Location: [facility];  Service: Endoscopy;  Laterality: N/A;  11:30 am  . EP IMPLANTABLE DEVICE N/A 01/06/2016   Procedure: Pacemaker Implant;  Surgeon: Danelle LELON Birmingham, MD;  Location: [facility] INVASIVE CV LAB;  Service: Cardiovascular;  Laterality: N/A;  . ESOPHAGOGASTRODUODENOSCOPY N/A 01/17/2014   Procedure: ESOPHAGOGASTRODUODENOSCOPY (EGD);  Surgeon: Claudis RAYMOND Rivet, MD;  Location: [facility];  Service: Endoscopy;  Laterality: N/A;  245  . EYE SURGERY    . JOINT REPLACEMENT     >8 yrs ago  . POLYPECTOMY  07/23/2022   Procedure: POLYPECTOMY;  Surgeon: Cindie Carlin POUR, DO;   Location: [facility];  Service: Endoscopy;;  . RIGHT/LEFT HEART CATH AND CORONARY ANGIOGRAPHY N/A 01/01/2020   Procedure: RIGHT/LEFT HEART CATH AND CORONARY ANGIOGRAPHY;  Surgeon: Verlin Lonni BIRCH, MD;  Location: [facility];  Service: Cardiovascular;  Laterality: N/A;  . TOTAL KNEE ARTHROPLASTY Bilateral right knee   2012, 2007    Social History:  reports that she has never smoked. She has never been exposed to tobacco smoke. She has never used smokeless tobacco. She reports that she does not drink alcohol and does not use drugs.   Allergies  Allergen Reactions  . Imipramine Rash  . Tricor [Fenofibrate] Other (See Comments)    Pain, aching    Family History  Problem Relation Age of Onset  . Cancer Mother   . Breast cancer Mother   . Bipolar disorder Mother   . Diabetes Mother   . Depression Mother   . Obesity Mother   . Anxiety disorder Mother   . Heart disease Mother   . Vision loss Mother   . Alcohol abuse Father   . Hypertension Father   . Alcoholism Father   . Bipolar disorder Sister   . Diabetes Sister   . Heart disease Other      Prior to Admission medications   Medication Sig Start Date End Date Taking? Authorizing Provider  acetaminophen  (TYLENOL ) 650 MG CR tablet Take 1,300 mg by mouth 2 (two) times daily.    [provider]  ALPRAZolam  (XANAX  XR) 1 MG 24 hr tablet Take 1 mg by mouth in the morning and at bedtime. Taking once a day    [provider]  apixaban  (ELIQUIS ) 5 MG TABS tablet Take 1 tablet (5 mg total) by mouth 2 (two) times daily. 03/27/24   Terra Fairy PARAS, PA-C  bisoprolol  (ZEBETA ) 5 MG tablet Take 0.5 tablets (2.5 mg total) by mouth daily. 10/25/23   Debera Jayson MATSU, MD  chlorthalidone  (HYGROTON ) 25 MG tablet Take 12.5 mg by mouth daily.    [provider]  Cholecalciferol (VITAMIN D3) 1.25 MG (50000 UT) CAPS Take 1 capsule by mouth once a week. 12/31/23   [provider]  esomeprazole   (NEXIUM ) 40 MG capsule Take 1 capsule (40 mg total) by mouth daily. 04/04/24   Butler, Michael C, MD  glucosamine-chondroitin 500-400 MG tablet Take 2 tablets by mouth every morning.     [provider]  lamoTRIgine  (LAMICTAL ) 200 MG tablet Take 400 mg by mouth at bedtime.    [provider]  Lutein 20 MG CAPS Take 20 mg by mouth every morning. 01/28/21   [provider]  Multiple Vitamin (MULTIVITAMIN) tablet Take 1 tablet by mouth daily. Senior    [provider]  ondansetron  (ZOFRAN -ODT) 4 MG disintegrating tablet Take 1 tablet (4 mg total) by mouth every 8 (eight) hours as needed. 04/04/24   Towana Ozell BROCKS, MD  QUEtiapine  (SEROQUEL   XR) 400 MG 24 hr tablet Take 800 mg by mouth at bedtime. 04/06/24   [provider]  rosuvastatin  (CRESTOR ) 10 MG tablet TAKE 1 TABLET(10 MG) BY MOUTH DAILY 04/13/23   Debera Jayson MATSU, MD    Physical Exam: BP (!) 155/60 (BP Location: Right Arm)   Pulse (!) 59   Temp 97.7 F (36.5 C) (Oral)   Resp 17   Ht 5' 4 (1.626 m)   Wt 124 kg   SpO2 100%   BMI 46.92 kg/m   General: 73 y.o. year-old female well developed well nourished in no acute distress.  Alert and oriented x3. HEENT: NCAT, EOMI Neck: Supple, trachea medial Cardiovascular: Regular rate and rhythm with no rubs or gallops.  No thyromegaly or JVD noted.  No lower extremity edema. 2/4 pulses in all 4 extremities. Respiratory: Clear to auscultation with no wheezes or rales. Good inspiratory effort. Abdomen: Soft, nontender nondistended with normal bowel sounds x4 quadrants. Muskuloskeletal: No cyanosis, clubbing or edema noted bilaterally Neuro: CN II-XII intact, strength 5/5 x 4, sensation, reflexes intact Skin: No ulcerative lesions noted or rashes Psychiatry: Judgement and insight appear normal. Mood is appropriate for condition and setting          Labs on Admission:  Basic Metabolic Panel: Recent Labs  Lab 04/17/24 1320  NA 141  K 3.9  CL  101  CO2 27  GLUCOSE 105*  BUN 14  CREATININE 0.94  CALCIUM  10.0  MG 2.2   Liver Function Tests: Recent Labs  Lab 04/17/24 1320  AST 29  ALT 23  ALKPHOS 87  BILITOT 0.4  PROT 7.3  ALBUMIN 4.5   No results for input(s): LIPASE, AMYLASE in the last 168 hours. No results for input(s): AMMONIA in the last 168 hours. CBC: Recent Labs  Lab 04/17/24 1320  WBC 5.2  HGB 13.0  HCT 39.7  MCV 99.5  PLT 234   Cardiac Enzymes: No results for input(s): CKTOTAL, CKMB, CKMBINDEX, TROPONINI in the last 168 hours.  BNP (last 3 results) Recent Labs    04/04/24 1101  BNP 28.0    ProBNP (last 3 results) No results for input(s): PROBNP in the last 8760 hours.  CBG: Recent Labs  Lab 04/17/24 1311  GLUCAP 133*    Radiological Exams on Admission: CT ANGIO HEAD NECK W WO CM Result Date: 04/17/2024 EXAM: CTA HEAD AND NECK WITHOUT AND WITH IV CONTRAST 04/17/2024 03:08:14 PM TECHNIQUE: CTA of the head and neck was performed without and with the administration of 75mL iohexol  (OMNIPAQUE ) 350 MG/ML injection. Multiplanar 2D and/or 3D reformatted images are provided for review. Automated exposure control, iterative reconstruction, and/or weight based adjustment of the mA/kV was utilized to reduce the radiation dose to as low as reasonably achievable. Stenosis of the internal carotid arteries measured using NASCET criteria. COMPARISON: None available CLINICAL HISTORY: Neuro deficit, acute, stroke suspected; Pt reports she feels dizzy and off balance x days when she walks and has to hold onto a cane or someone. FINDINGS: AORTIC ARCH AND ARCH VESSELS: No dissection or arterial injury. No significant stenosis of the brachiocephalic or subclavian arteries. CERVICAL CAROTID ARTERIES: No dissection, arterial injury, or hemodynamically significant stenosis by NASCET criteria. CERVICAL VERTEBRAL ARTERIES: No dissection, arterial injury, or significant stenosis. LUNGS AND MEDIASTINUM:  Unremarkable. SOFT TISSUES: No acute abnormality. BONES: No acute abnormality. ANTERIOR CIRCULATION: No significant stenosis of the internal carotid arteries. No significant stenosis of the anterior cerebral arteries. No significant stenosis of the middle cerebral arteries.  No aneurysm. POSTERIOR CIRCULATION: No significant stenosis of the posterior cerebral arteries. No significant stenosis of the basilar artery. No significant stenosis of the vertebral arteries. No aneurysm. OTHER: No dural venous sinus thrombosis on this non-dedicated study. IMPRESSION: 1. No large vessel occlusion or proximal hemodynamically significant stenosis. Electronically signed by: Gilmore Molt MD 04/17/2024 05:50 PM EDT RP Workstation: [id number]   MR BRAIN WO CONTRAST Result Date: 04/17/2024 EXAM: MR Brain without Intravenous Contrast. CLINICAL HISTORY: Neuro deficit, acute, stroke suspected. TECHNIQUE: Magnetic resonance images of the brain without intravenous contrast in multiple planes. CONTRAST: Without. COMPARISON: None provided. FINDINGS: BRAIN: No restricted diffusion to indicate acute infarction. No intracranial mass or hemorrhage. No midline shift or extra-axial fluid collection. Major arterial flow voids are patent. VENTRICLES: No hydrocephalus. ORBITS: The orbits are normal. SINUSES AND MASTOIDS: The sinuses and mastoid air cells are clear. BONES: No acute fracture or focal osseous lesion. IMPRESSION: 1. No acute abnormality. Electronically signed by: Gilmore Molt MD 04/17/2024 05:46 PM EDT RP Workstation: [id number]    EKG: I independently viewed the EKG done and my findings are as followed: Atrial paced rhythm at rate of 63 bpm with prolonged AV conduction with nonspecific T wave abnormality.  Assessment/Plan Present on Admission: . Essential hypertension . Atrial fibrillation (HCC) . GERD (gastroesophageal reflux disease) . Bipolar disorder (HCC)  Principal Problem:   Dizziness Active Problems:    GERD (gastroesophageal reflux disease)   Bipolar disorder (HCC)   Essential hypertension   Atrial fibrillation (HCC)   Abnormal EKG   Mixed hyperlipidemia   History of CHF (congestive heart failure)  Dizziness and weakness possibly due to abnormal EKG EKG personally reviewed showed Atrial paced rhythm at a rate of 63 bpm with prolonged AV conduction with nonspecific T wave abnormality. Patient had an ablation of atrial fibrillation about a week ago Continue telemetry Cardiologist on call (Dr. Loni) was consulted and recommended admitting patient to ED at [facility] with plan for cardiology to see her in the morning per EDP.  Essential hypertension (controlled) Patient stopped taking chlorthalidone  due to soft BP Hold other home BP meds Continue to monitor BP  Mixed hyperlipidemia Continue Crestor   Atrial fibrillation on Eliquis  Continue Eliquis  Bisoprolol  will be held at this time due to soft BP  GERD Continue PPI  History of CHF Patient appears euvolemic Continue total input/output, daily weights and fluid restriction Echocardiogram done on 03/17/2023 showed LVEF of 66 5%.  No WMA's.  Mild LVH.  Indeterminate LV diastolic parameters.    Bipolar disorder Lamotrigine  will be held at this time due to dizziness side effect profile  DVT prophylaxis: Lovenox   Code Status: Full code  Family Communication: None at bedside  Consults: Cardiology   Severity of Illness: The appropriate patient status for this patient is OBSERVATION. Observation status is judged to be reasonable and necessary in order to provide the required intensity of service to ensure the patient's safety. The patient's presenting symptoms, physical exam findings, and initial radiographic and laboratory data in the context of their medical condition is felt to place them at decreased risk for further clinical deterioration. Furthermore, it is anticipated that the patient will be medically stable for discharge from the  hospital within 2 midnights of admission.   Author: Jadrian Bulman, DO 04/17/2024 9:27 PM  For on call review [email or web address].

## 2024-04-18 NOTE — Hospital Course (Signed)
 [name] is a 73 y.o. female with medical history significant of hypertension, hyperlipidemia, atrial fibrillation on Eliquis , CHF, GERD, status post PPM placement presented to hospital with 2-day onset of generalized weakness and dizziness on ambulation that has resulted in being unsteady on her feet.  She now ambulates with a cane, though she did not require any assistive device on ambulation at baseline.  She endorsed having cardioversion about a week ago and also states that her blood pressure has been soft, so she stopped taking her chlorthalidone  since last few days.  In the ED patient was hemodynamically stable.  Troponin was negative.  CT angiogram of the head and neck without any large vessel occlusion.  MRI of the brain without any acute abnormality.  Cardiology was consulted from the ED and plan for admission to the hospital for further evaluation.  Dizziness and weakness  EKG showed d Atrial paced rhythm at a rate of 63 bpm with prolonged AV conduction with nonspecific T wave abnormality.  History of ablation for A-fib 1 week back.  Continue fall precautions telemetry. Cardiologist on call (Dr. Loni) was consulted and recommended cardiology follow-up.     Essential hypertension  Patient stopped taking chlorthalidone  due to soft BP.  Other antihypertensives on hold including bisoprolol  at this time.   Mixed hyperlipidemia Continue Crestor    Paroxysmal atrial fibrillation  Continue Eliquis , bisoprolol  on hold due to borderline low blood pressure.   GERD Continue Protonix .   Chronic diastolic CHF Appears compensated.  Continue intake and output charting, [name] weights and fluid restriction.  Review of previous 2D echocardiogram done from 03/17/2023 showed LV ejection fraction of 66%.  Bipolar disorder On nutrition.  Hold for now due to dizziness.

## 2024-04-18 NOTE — Plan of Care (Signed)
   Problem: Education: Goal: Knowledge of General Education information will improve Description: Including pain rating scale, medication(s)/side effects and non-pharmacologic comfort measures Outcome: Progressing   Problem: Clinical Measurements: Goal: Ability to maintain clinical measurements within normal limits will improve Outcome: Progressing Goal: Diagnostic test results will improve Outcome: Progressing

## 2024-04-18 NOTE — Progress Notes (Signed)
 Mobility Specialist Progress Note:    04/18/24 1410  Mobility  Activity Ambulated with assistance  Level of Assistance Standby assist, set-up cues, supervision of patient - no hands on  Assistive Device Cane  Distance Ambulated (ft) 130 ft  Range of Motion/Exercises Active;All extremities  Activity Response Tolerated well  Mobility Referral Yes  Mobility visit 1 Mobility  Mobility Specialist Start Time (ACUTE ONLY) 1410  Mobility Specialist Stop Time (ACUTE ONLY) 1425  Mobility Specialist Time Calculation (min) (ACUTE ONLY) 15 min   Pt received in bed, requesting to walk. Required supervision to stand and ambulate with cane/ no AD. Tolerated well, used cane whenever she became off balance without it. Denied dizziness but did say she was weak. NT in room, all needs met.  Hilton Saephan Mobility Specialist Please contact via Special educational needs teacher or  Rehab office at [phone]

## 2024-04-18 NOTE — Care Management Obs Status (Signed)
 MEDICARE OBSERVATION STATUS NOTIFICATION   Patient Details  Name: [name] MRN: [id number] Date of Birth: 1950-12-13   Medicare Observation Status Notification Given:  Yes    [name] 04/18/2024, 12:29 PM

## 2024-04-19 ENCOUNTER — Telehealth: Payer: Self-pay

## 2024-04-19 NOTE — Telephone Encounter (Signed)
 Preferably have this patient follow-up with me in a open slot somewhere within the next 10 days If that is not available may follow-up with Elveria or Charmaine

## 2024-04-19 NOTE — Discharge Summary (Signed)
 Physician Discharge Summary  [name] FMW:[id number] DOB: 1950/08/08 DOA: 04/17/2024  PCP: Alphonsa Glendia LABOR, MD  Admit date: 04/17/2024 Discharge date: 04/18/2024  Admitted From: Home  Discharge disposition: Home with home health   Recommendations for Outpatient Follow-Up:   Follow up with your primary care provider in one week.  Check CBC, BMP, magnesium  in the next visit Follow-up with cardiology as outpatient. Patient has been taken off chlorthalidone  and bisoprolol  as per cardiology.  Might need antihypertensives alternatively as outpatient.   Discharge Diagnosis:   Principal Problem:   Dizziness Active Problems:   GERD (gastroesophageal reflux disease)   Bipolar disorder (HCC)   Essential hypertension   Atrial fibrillation (HCC)   Abnormal EKG   Mixed hyperlipidemia   History of CHF (congestive heart failure)    Discharge Condition: Improved.  Diet recommendation: Low sodium, heart healthy.    Wound care: None.  Code status: Full.   History of Present Illness:   [name] is a 73 y.o. female with medical history significant of hypertension, hyperlipidemia, atrial fibrillation on Eliquis , CHF, GERD, status post PPM placement presented to hospital with 2-day onset of generalized weakness and dizziness on ambulation that has resulted in being unsteady on her feet.  She now ambulates with a cane, though she did not require any assistive device on ambulation at baseline.  She endorsed having cardioversion about a week ago and also states that her blood pressure has been soft, so she stopped taking her chlorthalidone  since last few days.  In the ED, patient was hemodynamically stable.  Troponin was negative.  CT angiogram of the head and neck without any large vessel occlusion.  MRI of the brain without any acute abnormality.  Cardiology was consulted from the ED and plan for admission to the hospital for further evaluation.    Hospital Course:   Following  conditions were addressed during hospitalization as listed below,  Dizziness and weakness  Improved.  EKG showed Atrial paced rhythm at a rate of 63 bpm with prolonged AV conduction with nonspecific T wave abnormality.  History of ablation for A-fib 1 week back.  Cardiology was consulted and at this time plan is to hold off with bisoprolol  and chlorthalidone  for now with outpatient cardiology follow-up.    Essential hypertension  Patient stopped taking chlorthalidone  due to soft BP.  As per cardiology bisoprolol  will also be discontinued on discharge.  PCP to monitor blood pressure as outpatient and prescribe medications as necessary.    Mixed hyperlipidemia Continue Crestor    Paroxysmal atrial fibrillation  Continue Eliquis ,   Bisoprolol  will be discontinued on discharge.   GERD Continue PPI on discharge   Chronic diastolic CHF Appears compensated.    Review of previous 2D echocardiogram done from 03/17/2023 showed LV ejection fraction of 66%.  Fluid and dietary modification advised   Bipolar disorder On Seroquel , Lamictal  at home.  Disposition.  At this time, patient is stable for disposition home with outpatient cardiology follow-up  Medical Consultants:   Cardiology  Procedures:    None Subjective:   Today, patient was seen and examined at bedside.  Patient denies any shortness of breath dyspnea or chest pain at this time.  Has not gotten out of the bed show on sure if she continues to have dizziness.  She had recent unsteadiness of her gait at home.   Discharge Exam:   Vitals:   04/18/24 0903 04/18/24 1703  BP: 126/70 (!) 111/57  Pulse: 81 66  Resp:  18  Temp:  (!) 97.3 F (36.3 C)  SpO2: 97% 98%   Vitals:   04/18/24 0856 04/18/24 0900 04/18/24 0903 04/18/24 1703  BP: (!) 127/59 134/69 126/70 (!) 111/57  Pulse: 85 86 81 66  Resp: 20   18  Temp: 98.1 F (36.7 C)   (!) 97.3 F (36.3 C)  TempSrc: Oral   Oral  SpO2: 92% 95% 97% 98%  Weight:      Height:        GENERAL: Patient is alert awake and oriented. Not in obvious distress.  Obese built HENT: No scleral pallor or icterus. Pupils equally reactive to light. Oral mucosa is moist NECK: is supple, no gross swelling noted. CHEST: Clear to auscultation. No crackles or wheezes.  CVS: S1 and S2 heard, no murmur. Regular rate and rhythm.  ABDOMEN: Soft, non-tender, bowel sounds are present. EXTREMITIES: No edema. CNS: Cranial nerves are intact. No focal motor deficits. SKIN: warm and dry without rashes  The results of significant diagnostics from this hospitalization (including imaging, microbiology, ancillary and laboratory) are listed below for reference.     Diagnostic Studies:   CT ANGIO HEAD NECK W WO CM Result Date: 04/17/2024 EXAM: CTA HEAD AND NECK WITHOUT AND WITH IV CONTRAST 04/17/2024 03:08:14 PM TECHNIQUE: CTA of the head and neck was performed without and with the administration of 75mL iohexol  (OMNIPAQUE ) 350 MG/ML injection. Multiplanar 2D and/or 3D reformatted images are provided for review. Automated exposure control, iterative reconstruction, and/or weight based adjustment of the mA/kV was utilized to reduce the radiation dose to as low as reasonably achievable. Stenosis of the internal carotid arteries measured using NASCET criteria. COMPARISON: None available CLINICAL HISTORY: Neuro deficit, acute, stroke suspected; Pt reports she feels dizzy and off balance x days when she walks and has to hold onto a cane or someone. FINDINGS: AORTIC ARCH AND ARCH VESSELS: No dissection or arterial injury. No significant stenosis of the brachiocephalic or subclavian arteries. CERVICAL CAROTID ARTERIES: No dissection, arterial injury, or hemodynamically significant stenosis by NASCET criteria. CERVICAL VERTEBRAL ARTERIES: No dissection, arterial injury, or significant stenosis. LUNGS AND MEDIASTINUM: Unremarkable. SOFT TISSUES: No acute abnormality. BONES: No acute abnormality. ANTERIOR CIRCULATION: No  significant stenosis of the internal carotid arteries. No significant stenosis of the anterior cerebral arteries. No significant stenosis of the middle cerebral arteries. No aneurysm. POSTERIOR CIRCULATION: No significant stenosis of the posterior cerebral arteries. No significant stenosis of the basilar artery. No significant stenosis of the vertebral arteries. No aneurysm. OTHER: No dural venous sinus thrombosis on this non-dedicated study. IMPRESSION: 1. No large vessel occlusion or proximal hemodynamically significant stenosis. Electronically signed by: Gilmore Molt MD 04/17/2024 05:50 PM EDT RP Workstation: [id number]   MR BRAIN WO CONTRAST Result Date: 04/17/2024 EXAM: MR Brain without Intravenous Contrast. CLINICAL HISTORY: Neuro deficit, acute, stroke suspected. TECHNIQUE: Magnetic resonance images of the brain without intravenous contrast in multiple planes. CONTRAST: Without. COMPARISON: None provided. FINDINGS: BRAIN: No restricted diffusion to indicate acute infarction. No intracranial mass or hemorrhage. No midline shift or extra-axial fluid collection. Major arterial flow voids are patent. VENTRICLES: No hydrocephalus. ORBITS: The orbits are normal. SINUSES AND MASTOIDS: The sinuses and mastoid air cells are clear. BONES: No acute fracture or focal osseous lesion. IMPRESSION: 1. No acute abnormality. Electronically signed by: Gilmore Molt MD 04/17/2024 05:46 PM EDT RP Workstation: [id number]     Labs:   Basic Metabolic Panel: Recent Labs  Lab 04/17/24 1320  NA 141  K 3.9  CL 101  CO2  27  GLUCOSE 105*  BUN 14  CREATININE 0.94  CALCIUM  10.0  MG 2.2   GFR Estimated Creatinine Clearance: 71.2 mL/min (by C-G formula based on SCr of 0.94 mg/dL). Liver Function Tests: Recent Labs  Lab 04/17/24 1320  AST 29  ALT 23  ALKPHOS 87  BILITOT 0.4  PROT 7.3  ALBUMIN 4.5   No results for input(s): LIPASE, AMYLASE in the last 168 hours. No results for input(s): AMMONIA in  the last 168 hours. Coagulation profile No results for input(s): INR, PROTIME in the last 168 hours.  CBC: Recent Labs  Lab 04/17/24 1320  WBC 5.2  HGB 13.0  HCT 39.7  MCV 99.5  PLT 234   Cardiac Enzymes: No results for input(s): CKTOTAL, CKMB, CKMBINDEX, TROPONINI in the last 168 hours. BNP: Invalid input(s): POCBNP CBG: Recent Labs  Lab 04/17/24 1311  GLUCAP 133*   D-Dimer No results for input(s): DDIMER in the last 72 hours. Hgb A1c No results for input(s): HGBA1C in the last 72 hours. Lipid Profile No results for input(s): CHOL, HDL, LDLCALC, TRIG, CHOLHDL, LDLDIRECT in the last 72 hours. Thyroid  function studies No results for input(s): TSH, T4TOTAL, T3FREE, THYROIDAB in the last 72 hours.  Invalid input(s): FREET3 Anemia work up No results for input(s): VITAMINB12, FOLATE, FERRITIN, TIBC, IRON, RETICCTPCT in the last 72 hours. Microbiology No results found for this or any previous visit (from the past 240 hours).   Discharge Instructions:   Discharge Instructions     Call MD for:  persistant dizziness or light-headedness   Complete by: As directed    Diet - low sodium heart healthy   Complete by: As directed    Discharge instructions   Complete by: As directed    Follow-up with your primary care provider in 1 week.  Check blood work at that time.  Continue to check your blood pressures at home.  Follow-up with cardiology as scheduled with the clinic.  Do not take chlorthalidone  and bisoprolol  until next visit.   Increase activity slowly   Complete by: As directed       Allergies as of 04/18/2024       Reactions   Imipramine Rash   Tricor [fenofibrate] Other (See Comments)   Pain, aching        Medication List     PAUSE taking these medications    bisoprolol  5 MG tablet Wait to take this until your doctor or other care provider tells you to start again. Commonly known as: ZEBETA  Take  0.5 tablets (2.5 mg total) by mouth daily.   chlorthalidone  25 MG tablet Wait to take this until your doctor or other care provider tells you to start again. Commonly known as: HYGROTON  Take 12.5 mg by mouth daily.       TAKE these medications    acetaminophen  650 MG CR tablet Commonly known as: TYLENOL  Take 1,300 mg by mouth 2 (two) times daily.   ALPRAZolam  1 MG 24 hr tablet Commonly known as: XANAX  XR Take 1 mg by mouth See admin instructions. Take 1 tablet in the morning and then take 1 tablet as needed for anxiety up to one time a day.   apixaban  5 MG Tabs tablet Commonly known as: ELIQUIS  Take 1 tablet (5 mg total) by mouth 2 (two) times daily.   esomeprazole  40 MG capsule Commonly known as: NEXIUM  Take 1 capsule (40 mg total) by mouth daily. What changed:  when to take this reasons to take this  glucosamine-chondroitin 500-400 MG tablet Take 2 tablets by mouth every morning.   lamoTRIgine  200 MG tablet Commonly known as: LAMICTAL  Take 400 mg by mouth at bedtime.   Lutein 20 MG Caps Take 20 mg by mouth every morning.   multivitamin tablet Take 1 tablet by mouth daily. Senior   ondansetron  4 MG disintegrating tablet Commonly known as: ZOFRAN -ODT Take 1 tablet (4 mg total) by mouth every 8 (eight) hours as needed.   QUEtiapine  300 MG 24 hr tablet Commonly known as: SEROQUEL  XR Take 600 mg by mouth at bedtime.   rosuvastatin  10 MG tablet Commonly known as: CRESTOR  TAKE 1 TABLET(10 MG) BY MOUTH DAILY What changed: See the new instructions.   Vitamin D3 1.25 MG (50000 UT) Caps Take 1 capsule by mouth every Sunday.        Follow-up Information     Alphonsa Glendia LABOR, MD Follow up in 1 week(s).   Specialty: Family Medicine Contact information: [address] [place] [address] [phone]                  Time coordinating discharge: 39 minutes  Signed:  Rexine Gowens  [facility] 04/19/2024, 2:47  PM

## 2024-04-19 NOTE — Transitions of Care (Post Inpatient/ED Visit) (Signed)
 04/19/2024  Name: [name] MRN: [id number] DOB: 06/10/1951  Today's TOC FU Call Status: Today's TOC FU Call Status:: Successful TOC FU Call Completed TOC FU Call Complete Date: 04/19/24 Patient's Name and Date of Birth confirmed.  Transition Care Management Follow-up Telephone Call Date of Discharge: 04/18/24 [facility] Type of Discharge: Inpatient Admission Primary Inpatient Discharge Diagnosis:: dizziness How have you been since you were released from the hospital?: Better Any questions or concerns?: No  Items Reviewed: Did you receive and understand the discharge instructions provided?: Yes Medications obtained,verified, and reconciled?: Yes (Medications Reviewed) Any new allergies since your discharge?: No Dietary orders reviewed?: Yes Do you have support at home?: Yes People in Home [RPT]: other relative(s)  Medications Reviewed Today: Medications Reviewed Today     Reviewed by Emmitt Pan, LPN (Licensed Practical Nurse) on 04/19/24 at 1434  Med List Status: <None>   Medication Order Taking? Sig Documenting Provider Last Dose Status Informant  acetaminophen  (TYLENOL ) 650 MG CR tablet [id number] Yes Take 1,300 mg by mouth 2 (two) times daily. [provider]  Active Self, Pharmacy Records           Med Note CHRISTIE, ALYSON   Fri Jul 16, 2022  2:57 PM)    ALPRAZolam  (XANAX  XR) 1 MG 24 hr tablet [id number] Yes Take 1 mg by mouth See admin instructions. Take 1 tablet in the morning and then take 1 tablet as needed for anxiety up to one time a day. [provider]  Active Self, Pharmacy Records           Med Note DRENA, CHUCK MATSU   Tue Apr 17, 2024  8:32 PM)    apixaban  (ELIQUIS ) 5 MG TABS tablet [id number] Yes Take 1 tablet (5 mg total) by mouth 2 (two) times daily. Terra Fairy PARAS, PA-C  Active Self, Pharmacy Records  bisoprolol  (ZEBETA ) 5 MG tablet [id number]  Take 0.5 tablets (2.5 mg total) by mouth daily.  Patient not  taking: Reported on 04/19/2024   Debera Jayson MATSU, MD  Active Self, Pharmacy Records  chlorthalidone  (HYGROTON ) 25 MG tablet [id number]  Take 12.5 mg by mouth daily.  Patient not taking: Reported on 04/19/2024   [provider]  Active Self, Pharmacy Records  Cholecalciferol (VITAMIN D3) 1.25 MG (50000 UT) CAPS [id number] Yes Take 1 capsule by mouth every Sunday. [provider]  Active Self, Pharmacy Records  esomeprazole  (NEXIUM ) 40 MG capsule [id number] Yes Take 1 capsule (40 mg total) by mouth daily.  Patient taking differently: Take 40 mg by mouth daily as needed (heartburn, acid reflux).   Towana Ozell BROCKS, MD  Active Self, Pharmacy Records  glucosamine-chondroitin 500-400 MG tablet [id number] Yes Take 2 tablets by mouth every morning.  [provider]  Active Self, Pharmacy Records  lamoTRIgine  (LAMICTAL ) 200 MG tablet [id number] Yes Take 400 mg by mouth at bedtime. [provider]  Active Self, Pharmacy Records  Lutein 20 MG CAPS [id number] Yes Take 20 mg by mouth every morning. [provider]  Active Self, Pharmacy Records  Multiple Vitamin (MULTIVITAMIN) tablet [id number] Yes Take 1 tablet by mouth daily. Senior [provider]  Active Self, Pharmacy Records  ondansetron  (ZOFRAN -ODT) 4 MG disintegrating tablet [id number]  Take 1 tablet (4 mg total) by mouth every 8 (eight) hours as needed.  Patient not taking: Reported on 04/19/2024   Towana Ozell BROCKS, MD  Active Self, Pharmacy Records           Med Note (  WARD, ANGELICA G   Tue Apr 17, 2024  8:53 PM) D/t [facility] provider prescribing this Rx within past 30 days I cannot delete it, pt states she does not take this and wants this removed from her list  QUEtiapine  (SEROQUEL  XR) 300 MG 24 hr tablet [id number] Yes Take 600 mg by mouth at bedtime. [provider]  Active Self, Pharmacy Records  rosuvastatin  (CRESTOR ) 10 MG tablet [id number]  TAKE 1 TABLET(10 MG) BY MOUTH DAILY  Patient  not taking: Reported on 04/19/2024   Debera Jayson MATSU, MD  Active Self, Pharmacy Records            Home Care and Equipment/Supplies: Were Home Health Services Ordered?: NA Any new equipment or medical supplies ordered?: NA  Functional Questionnaire: Do you need assistance with bathing/showering or dressing?: No Do you need assistance with meal preparation?: No Do you need assistance with eating?: No Do you have difficulty maintaining continence: No Do you need assistance with getting out of bed/getting out of a chair/moving?: No Do you have difficulty managing or taking your medications?: No  Follow up appointments reviewed: PCP Follow-up appointment confirmed?: No (no avail appts, sent message to staff) MD Provider Line Number:[phone] Given: No Specialist Hospital Follow-up appointment confirmed?: Yes Date of Specialist follow-up appointment?: 04/20/24 Follow-Up Specialty Provider:: cardio Do you need transportation to your follow-up appointment?: No Do you understand care options if your condition(s) worsen?: Yes-patient verbalized understanding    SIGNATURE Julian Lemmings, LPN [facility] Nurse Health Advisor Direct Dial [phone]

## 2024-04-20 ENCOUNTER — Ambulatory Visit (HOSPITAL_COMMUNITY)
Admission: RE | Admit: 2024-04-20 | Discharge: 2024-04-20 | Disposition: A | Discharge status: Home / Self Care | Source: Ambulatory Visit | Attending: Internal Medicine | Admitting: Internal Medicine

## 2024-04-20 VITALS — BP 128/80 | HR 61 | Ht 64.0 in | Wt 274.2 lb

## 2024-04-20 DIAGNOSIS — D6869 Other thrombophilia: Secondary | ICD-10-CM | POA: Diagnosis not present

## 2024-04-20 DIAGNOSIS — I4819 Other persistent atrial fibrillation: Secondary | ICD-10-CM

## 2024-04-20 NOTE — Progress Notes (Signed)
 Primary Care Physician: Alphonsa Glendia LABOR, MD Primary Cardiologist: Jayson Sierras, MD Electrophysiologist: Danelle Birmingham, MD     Referring Physician: Device clinic     [name] is a 73 y.o. female with a history of sinus node dysfunction s/p PPM, morbid obesity, HTN, chronic diastolic HF, carotid artery stenosis, and atrial fibrillation who presents for consultation in the [facility]. Device clinic alert on 9/2 for new Afib; Dr. Sierras on 9/3 recommended patient to start Eliquis  and stop ASA. Patient is on Eliquis  5 mg BID for a CHADS2VASC score of 4.  On follow up 04/20/24, patient is currently in A paced rhythm. S/p successful DCCV on 04/09/24. Hospital admission 10/7-8 for dizziness with ECG showing atrial paced rhythm. Bisoprolol  and chlorthalidone  held due to hypotension and orthostatic changes. Patient with daughter today and daughter notes that the patient still feels unwell in terms of dizzy/unsteady balance. Patient also notes sometimes brief heart fluttering at times. She does not have any fluid retention at this time. No missed doses of Eliquis .   Today, she denies symptoms of chest pain, shortness of breath, orthopnea, PND, lower extremity edema, presyncope, syncope, snoring, daytime somnolence, bleeding, or neurologic sequela. The patient is tolerating medications without difficulties and is otherwise without complaint today.    she has a BMI of Body mass index is 47.07 kg/m.SABRA Filed Weights   04/20/24 1442  Weight: 124.4 kg     Current Outpatient Medications  Medication Sig Dispense Refill   rosuvastatin  (CRESTOR ) 10 MG tablet TAKE 1 TABLET(10 MG) BY MOUTH DAILY 90 tablet 3   acetaminophen  (TYLENOL ) 650 MG CR tablet Take 1,300 mg by mouth 2 (two) times daily.     ALPRAZolam  (XANAX  XR) 1 MG 24 hr tablet Take 1 mg by mouth See admin instructions. Take 1 tablet in the morning and then take 1 tablet as needed for anxiety up to one time a  day.     apixaban  (ELIQUIS ) 5 MG TABS tablet Take 1 tablet (5 mg total) by mouth 2 (two) times daily. 56 tablet    [Paused] bisoprolol  (ZEBETA ) 5 MG tablet Take 0.5 tablets (2.5 mg total) by mouth daily. (Patient not taking: Reported on 04/19/2024) 45 tablet 3   [Paused] chlorthalidone  (HYGROTON ) 25 MG tablet Take 12.5 mg by mouth daily. (Patient not taking: Reported on 04/19/2024)     Cholecalciferol (VITAMIN D3) 1.25 MG (50000 UT) CAPS Take 1 capsule by mouth every Sunday.     esomeprazole  (NEXIUM ) 40 MG capsule Take 1 capsule (40 mg total) by mouth daily. (Patient not taking: Reported on 04/20/2024) 30 capsule 0   glucosamine-chondroitin 500-400 MG tablet Take 2 tablets by mouth every morning.      lamoTRIgine  (LAMICTAL ) 200 MG tablet Take 400 mg by mouth at bedtime.     Lutein 20 MG CAPS Take 20 mg by mouth every morning.     Multiple Vitamin (MULTIVITAMIN) tablet Take 1 tablet by mouth daily. Senior     ondansetron  (ZOFRAN -ODT) 4 MG disintegrating tablet Take 1 tablet (4 mg total) by mouth every 8 (eight) hours as needed. (Patient not taking: Reported on 04/20/2024) 20 tablet 0   QUEtiapine  (SEROQUEL  XR) 300 MG 24 hr tablet Take 600 mg by mouth at bedtime.     No current facility-administered medications for this encounter.    Atrial Fibrillation Management history:  Previous antiarrhythmic drugs: none Previous cardioversions: 04/09/24 Previous ablations: none Anticoagulation history: Eliquis    ROS- All systems are reviewed and  negative except as per the HPI above.  Physical Exam: BP 128/80   Pulse 61   Ht 5' 4 (1.626 m)   Wt 124.4 kg   BMI 47.07 kg/m   GEN- The patient is well appearing, alert and oriented x 3 today.   Neck - no JVD or carotid bruit noted Lungs- Clear to ausculation bilaterally, normal work of breathing Heart- Regular rate and rhythm, no murmurs, rubs or gallops, PMI not laterally displaced Extremities- no clubbing, cyanosis, or edema Skin - no rash or  ecchymosis noted   EKG today demonstrates  Vent. rate 61 BPM PR interval 252 ms QRS duration 92 ms QT/QTcB 406/408 ms P-R-T axes 68 -38 -12 Atrial-paced rhythm with prolonged AV conduction Left axis deviation Nonspecific T wave abnormality Abnormal ECG When compared with ECG of 17-Apr-2024 12:54, No significant change was found  Echo 03/17/23 demonstrated  1. Left ventricular ejection fraction, by estimation, is 60 to 65%. Left  ventricular ejection fraction by 2D MOD biplane is 61.1 %. The left  ventricle has normal function. The left ventricle has no regional wall  motion abnormalities. There is mild left  ventricular hypertrophy. Indeterminate diastolic filling due to E-A  fusion.   2. Right ventricular systolic function is normal. The right ventricular  size is normal. There is normal pulmonary artery systolic pressure. The  estimated right ventricular systolic pressure is 29.6 mmHg.   3. The mitral valve is grossly normal. No evidence of mitral valve  regurgitation. No evidence of mitral stenosis.   4. The aortic valve is tricuspid. Aortic valve regurgitation is not  visualized. No aortic stenosis is present.   5. The inferior vena cava is normal in size with greater than 50%  respiratory variability, suggesting right atrial pressure of 3 mmHg.   ASSESSMENT & PLAN CHA2DS2-VASc Score = 4  The patient's score is based upon: CHF History: 1 HTN History: 1 Diabetes History: 0 Stroke History: 0 Vascular Disease History: 0 Age Score: 1 Gender Score: 1       ASSESSMENT AND PLAN: Persistent Atrial Fibrillation (ICD10:  I48.19) / Atrial flutter The patient's CHA2DS2-VASc score is 4, indicating a 4.8% annual risk of stroke.   S/p successful DCCV on 04/09/24.  Patient is currently in A paced rhythm. Will continue to hold bisoprolol  and chlorthalidone  for now due to patient noting ongoing dizziness and unsteady balance. Recommended to patient to contact device clinic if she  notes increase in palpitations. Advised in that circumstance she may need to trial restarting bisoprolol  2.5 mg daily. Similarly, if patient notes increase in fluid retention may need to restart chlorthalidone . Recommended to follow up with PCP regarding ongoing symptoms despite being in normal rhythm.   Secondary Hypercoagulable State (ICD10:  D68.69) The patient is at significant risk for stroke/thromboembolism based upon her CHA2DS2-VASc Score of 4.  Continue Apixaban  (Eliquis ).  No missed doses of Eliquis .     Follow up 6 months Afib clinic.    Terra Pac, [facility]  Afib Clinic [address] [place], [place] [address] [phone]

## 2024-04-20 NOTE — Patient Instructions (Signed)
Device Clinic: [phone] 

## 2024-04-21 ENCOUNTER — Other Ambulatory Visit: Payer: Self-pay | Admitting: Cardiology

## 2024-04-25 ENCOUNTER — Encounter (INDEPENDENT_AMBULATORY_CARE_PROVIDER_SITE_OTHER): Payer: Self-pay | Admitting: Gastroenterology

## 2024-04-25 ENCOUNTER — Encounter: Payer: Self-pay | Admitting: *Deleted

## 2024-04-25 NOTE — Telephone Encounter (Signed)
 Follow up appointment scheduled and patient notified via Mychart

## 2024-04-26 ENCOUNTER — Other Ambulatory Visit: Payer: Self-pay

## 2024-04-26 ENCOUNTER — Emergency Department (HOSPITAL_COMMUNITY)

## 2024-04-26 ENCOUNTER — Encounter (HOSPITAL_COMMUNITY): Payer: Self-pay

## 2024-04-26 ENCOUNTER — Emergency Department (HOSPITAL_COMMUNITY)
Admission: EM | Admit: 2024-04-26 | Discharge: 2024-04-26 | Disposition: A | Discharge status: Home / Self Care | Attending: Emergency Medicine | Admitting: Emergency Medicine

## 2024-04-26 DIAGNOSIS — M778 Other enthesopathies, not elsewhere classified: Secondary | ICD-10-CM | POA: Diagnosis not present

## 2024-04-26 DIAGNOSIS — Z7901 Long term (current) use of anticoagulants: Secondary | ICD-10-CM | POA: Diagnosis not present

## 2024-04-26 DIAGNOSIS — H04331 Acute lacrimal canaliculitis of right lacrimal passage: Secondary | ICD-10-CM | POA: Diagnosis not present

## 2024-04-26 DIAGNOSIS — I110 Hypertensive heart disease with heart failure: Secondary | ICD-10-CM | POA: Insufficient documentation

## 2024-04-26 DIAGNOSIS — S199XXA Unspecified injury of neck, initial encounter: Secondary | ICD-10-CM | POA: Diagnosis not present

## 2024-04-26 DIAGNOSIS — S161XXA Strain of muscle, fascia and tendon at neck level, initial encounter: Secondary | ICD-10-CM | POA: Diagnosis not present

## 2024-04-26 DIAGNOSIS — Z950 Presence of cardiac pacemaker: Secondary | ICD-10-CM | POA: Diagnosis not present

## 2024-04-26 DIAGNOSIS — M47812 Spondylosis without myelopathy or radiculopathy, cervical region: Secondary | ICD-10-CM | POA: Diagnosis not present

## 2024-04-26 DIAGNOSIS — I509 Heart failure, unspecified: Secondary | ICD-10-CM | POA: Diagnosis not present

## 2024-04-26 DIAGNOSIS — I6523 Occlusion and stenosis of bilateral carotid arteries: Secondary | ICD-10-CM | POA: Diagnosis not present

## 2024-04-26 DIAGNOSIS — M19011 Primary osteoarthritis, right shoulder: Secondary | ICD-10-CM | POA: Diagnosis not present

## 2024-04-26 DIAGNOSIS — R202 Paresthesia of skin: Secondary | ICD-10-CM | POA: Diagnosis not present

## 2024-04-26 DIAGNOSIS — Z041 Encounter for examination and observation following transport accident: Secondary | ICD-10-CM | POA: Diagnosis not present

## 2024-04-26 DIAGNOSIS — Y92410 Unspecified street and highway as the place of occurrence of the external cause: Secondary | ICD-10-CM | POA: Insufficient documentation
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
## 2024-05-03 ENCOUNTER — Ambulatory Visit: Payer: Self-pay | Admitting: Nurse Practitioner

## 2024-05-03 ENCOUNTER — Encounter: Payer: Self-pay | Admitting: Nurse Practitioner

## 2024-05-03 VITALS — BP 127/79 | HR 68 | Temp 97.7°F | Ht 64.0 in | Wt 281.0 lb

## 2024-05-03 DIAGNOSIS — F4323 Adjustment disorder with mixed anxiety and depressed mood: Secondary | ICD-10-CM | POA: Diagnosis not present

## 2024-05-03 DIAGNOSIS — I10 Essential (primary) hypertension: Secondary | ICD-10-CM

## 2024-05-03 MED ORDER — CHLORTHALIDONE 25 MG PO TABS: 12.5000 mg | ORAL_TABLET | Freq: Every day | ORAL | 1 refills | Status: DC

## 2024-05-03 NOTE — Progress Notes (Signed)
 Subjective:    Patient ID: [name], female    DOB: August 19, 1950, 73 y.o.   MRN: [id number]  HPI Discussed the use of AI scribe software for clinical note transcription with the patient, who gave verbal consent to proceed.  History of Present Illness [name] is a 73 year old female with a complex cardiac history who presents for follow-up after recent emergency room visits, specifically heart issues.  She was seen in the emergency department on the 16th and has a complex cardiac history, including atrial fibrillation. She recently saw her AFib specialist on the 10th and had a cardioversion on the 29th of September. She has been experiencing palpitations and fluttering, which have improved post-cardioversion. She is anxious about her cardiac condition, which led to an ER visit on the 7th of October due to palpitations.  She has been experiencing dizziness and unsteadiness, which led to holding her medications, Bisoprolol  and chlorthalidone  by A fib specialist, due to hypotension and orthostatic changes. Her blood pressure has been running high, around 140/80, after the medications were held. She attributes fluctuations in her blood pressure to changes in her weight and diet, noting that when her weight is down, her blood pressure drops. No current chest pain, chest tightness, unusual shortness of breath, coughing, or wheezing. No peripheral edema. No orthopnea.  She experienced a significant increase in her Seroquel  dosage to 800 mg in December following a meltdown, which she believes has affected her balance and caused dizziness. She has since weaned back the dosage, which has improved her balance issues. The balance issues were severe enough to require assistance while walking during an ER visit.  She has a history of palpitations and rapid heart rate, for which she was prescribed bisoprolol . Since the cardioversion, her palpitations and fluttering have improved. She had been on  chlorthalidone  long term until recently, which she tolerated well.      05/03/2024   10:29 AM  Depression screen PHQ 2/9  Decreased Interest 0  Down, Depressed, Hopeless 0  PHQ - 2 Score 0  Altered sleeping 0  Tired, decreased energy 0  Change in appetite 0  Feeling bad or failure about yourself  0  Trouble concentrating 0  Moving slowly or fidgety/restless 0  Suicidal thoughts 0  PHQ-9 Score 0  Difficult doing work/chores Not difficult at all      05/03/2024   10:30 AM 10/03/2023    9:39 AM 07/05/2023   10:17 AM 03/09/2023   11:28 AM  GAD 7 : Generalized Anxiety Score  Nervous, Anxious, on Edge 1 3 1 1   Control/stop worrying 1 3 1 2   Worry too much - different things 1 3 1 2   Trouble relaxing 1 1 1    Restless 1 0 1 0  Easily annoyed or irritable 1 0 1 0  Afraid - awful might happen 1 3 0 0  Total GAD 7 Score 7 13 6    Anxiety Difficulty Not difficult at all Not difficult at all Somewhat difficult     Social History   Tobacco Use   Smoking status: Never    Passive exposure: Never   Smokeless tobacco: Never   Tobacco comments:    Never smoked 03/27/24  Vaping Use   Vaping status: Never Used  Substance Use Topics   Alcohol use: No    Alcohol/week: 0.0 standard drinks of alcohol   Drug use: No        Objective:   Physical Exam Vitals and nursing note  reviewed.  Constitutional:      General: She is not in acute distress. Cardiovascular:     Rate and Rhythm: Normal rate and regular rhythm.     Heart sounds: Normal heart sounds.  Pulmonary:     Effort: Pulmonary effort is normal.     Breath sounds: Normal breath sounds.  Neurological:     Mental Status: She is alert and oriented to person, place, and time.     Gait: Gait normal.     Comments: Gets off and on exam table without assistance.   Psychiatric:        Mood and Affect: Mood normal.        Behavior: Behavior normal.        Thought Content: Thought content normal.    Today's Vitals   05/03/24  1019 05/03/24 1058  BP: (!) 141/81 127/79  Pulse: 68   Temp: 97.7 F (36.5 C)   SpO2: 97%   Weight: 281 lb (127.5 kg)   Height: 5' 4 (1.626 m)    Body mass index is 48.23 kg/m.        Assessment & Plan:  1. Essential hypertension (Primary) Blood pressure elevated at 140/80 mmHg. Previous hypotension and orthostatic changes due to medication side effects. Goal: manage blood pressure without orthostatic hypotension. - Restart chlorthalidone  25 mg since she has tolerated this well in the past. - Monitor blood pressure at home, including standing measurements. - Hold bisoprolol  for now since A fib and palpitations are resolved.  - Ensure adequate hydration. - Discuss blood pressure management with cardiologist in December. - chlorthalidone  (HYGROTON ) 25 MG tablet; Take 0.5 tablets (12.5 mg total) by mouth daily.  Dispense: 45 tablet; Refill: 1  Follow up with cardiology as planned in December. Call back sooner if any issues.

## 2024-05-03 NOTE — Patient Instructions (Signed)
 Restart Chlorthalidone  12.5 mg daily as previously ordered. Hold on Bisoprolol .

## 2024-05-04 DIAGNOSIS — H04331 Acute lacrimal canaliculitis of right lacrimal passage: Secondary | ICD-10-CM | POA: Diagnosis not present

## 2024-05-10 DIAGNOSIS — H353132 Nonexudative age-related macular degeneration, bilateral, intermediate dry stage: Secondary | ICD-10-CM | POA: Diagnosis not present

## 2024-05-10 DIAGNOSIS — H43821 Vitreomacular adhesion, right eye: Secondary | ICD-10-CM | POA: Diagnosis not present

## 2024-05-10 DIAGNOSIS — H353211 Exudative age-related macular degeneration, right eye, with active choroidal neovascularization: Secondary | ICD-10-CM | POA: Diagnosis not present

## 2024-05-10 DIAGNOSIS — H34232 Retinal artery branch occlusion, left eye: Secondary | ICD-10-CM | POA: Diagnosis not present

## 2024-05-10 DIAGNOSIS — H3321 Serous retinal detachment, right eye: Secondary | ICD-10-CM | POA: Diagnosis not present

## 2024-05-10 DIAGNOSIS — H35371 Puckering of macula, right eye: Secondary | ICD-10-CM | POA: Diagnosis not present

## 2024-05-10 DIAGNOSIS — H35351 Cystoid macular degeneration, right eye: Secondary | ICD-10-CM | POA: Diagnosis not present

## 2024-05-10 DIAGNOSIS — H35071 Retinal telangiectasis, right eye: Secondary | ICD-10-CM | POA: Diagnosis not present

## 2024-05-10 LAB — OPHTHALMOLOGY REPORT-SCANNED

## 2024-05-15 DIAGNOSIS — H34232 Retinal artery branch occlusion, left eye: Secondary | ICD-10-CM | POA: Diagnosis not present

## 2024-05-15 DIAGNOSIS — H35351 Cystoid macular degeneration, right eye: Secondary | ICD-10-CM | POA: Diagnosis not present

## 2024-05-15 DIAGNOSIS — H353132 Nonexudative age-related macular degeneration, bilateral, intermediate dry stage: Secondary | ICD-10-CM | POA: Diagnosis not present

## 2024-05-15 DIAGNOSIS — H35071 Retinal telangiectasis, right eye: Secondary | ICD-10-CM | POA: Diagnosis not present

## 2024-05-15 DIAGNOSIS — H35371 Puckering of macula, right eye: Secondary | ICD-10-CM | POA: Diagnosis not present

## 2024-05-15 DIAGNOSIS — H3321 Serous retinal detachment, right eye: Secondary | ICD-10-CM | POA: Diagnosis not present

## 2024-05-15 DIAGNOSIS — H353211 Exudative age-related macular degeneration, right eye, with active choroidal neovascularization: Secondary | ICD-10-CM | POA: Diagnosis not present

## 2024-05-15 DIAGNOSIS — H43821 Vitreomacular adhesion, right eye: Secondary | ICD-10-CM | POA: Diagnosis not present

## 2024-05-23 ENCOUNTER — Telehealth: Payer: Self-pay | Admitting: Internal Medicine
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
## 2024-05-23 NOTE — Telephone Encounter (Signed)
 Patient c/o Palpitations:  STAT if patient reporting lightheadedness, shortness of breath, or chest pain  How long have you had palpitations/irregular HR/ Afib? Are you having the symptoms now?   Yes  Are you currently experiencing lightheadedness, SOB or CP?   Lightheaded  Do you have a history of afib (atrial fibrillation) or irregular heart rhythm?   Yes  Have you checked your BP or HR? (document readings if available):   129/85  HR 75  Are you experiencing any other symptoms?   Extremely tired  Patient is concerned she may have been in afib for the last 2 days.

## 2024-05-23 NOTE — Telephone Encounter (Signed)
 Called and spoke with patient  Informed her that there are no new alerts from her device on the [employer] website as of 05/23/24  AF burden 0%  Patient scheduled with Charlies Arthur, PA-C on 05/25/24 for follow up   Patient appreciative of phone call and appointment set up

## 2024-05-23 NOTE — Telephone Encounter (Signed)
 Pt states the over the last 2-3 days she has been having palpitations and feeling lightheaded. She feels that she may be back in AFib. Pt was seen in the ER for the same this on 04/17/24. She denies chest pain, SOB and syncope at this time. Please advise.

## 2024-05-25 ENCOUNTER — Encounter: Payer: Self-pay | Admitting: Physician Assistant

## 2024-05-25 ENCOUNTER — Ambulatory Visit: Attending: Physician Assistant | Admitting: Physician Assistant

## 2024-05-25 ENCOUNTER — Ambulatory Visit

## 2024-05-25 VITALS — BP 160/82 | HR 69 | Ht 64.0 in | Wt 279.8 lb

## 2024-05-25 DIAGNOSIS — R55 Syncope and collapse: Secondary | ICD-10-CM

## 2024-05-25 DIAGNOSIS — Z950 Presence of cardiac pacemaker: Secondary | ICD-10-CM | POA: Diagnosis not present

## 2024-05-25 DIAGNOSIS — I483 Typical atrial flutter: Secondary | ICD-10-CM | POA: Diagnosis not present

## 2024-05-25 DIAGNOSIS — R002 Palpitations: Secondary | ICD-10-CM | POA: Diagnosis not present

## 2024-05-25 LAB — CUP PACEART INCLINIC DEVICE CHECK
Date Time Interrogation Session: 20251114132246
Implantable Lead Connection Status: 753985
Implantable Lead Connection Status: 753985
Implantable Lead Implant Date: 20170627
Implantable Lead Implant Date: 20170627
Implantable Lead Location: 753859
Implantable Lead Location: 753860
Implantable Lead Model: 377
Implantable Lead Model: 377
Implantable Lead Serial Number: 49436227
Implantable Lead Serial Number: 49471106
Implantable Pulse Generator Implant Date: 20170627
Pulse Gen Model: 394969
Pulse Gen Serial Number: 68786455

## 2024-05-25 NOTE — Patient Instructions (Addendum)
 Medication Instructions:   Your physician recommends that you continue on your current medications as directed. Please refer to the Current Medication list given to you today.  *If you need a refill on your cardiac medications before your next appointment, please call your pharmacy*   Lab Work: NONE ORDERED  TODAY    If you have labs (blood work) drawn today and your tests are completely normal, you will receive your results only by: MyChart Message (if you have MyChart) OR A paper copy in the mail If you have any lab test that is abnormal or we need to change your treatment, we will call you to review the results.    Testing/Procedures: Your physician has recommended that you wear an event monitor. Event monitors are medical devices that record the heart's electrical activity. Doctors most often us  these monitors to diagnose arrhythmias. Arrhythmias are problems with the speed or rhythm of the heartbeat. The monitor is a small, portable device. You can wear one while you do your normal daily activities. This is usually used to diagnose what is causing palpitations/syncope (passing out).    Follow-Up: At [facility], you and your health needs are our priority.  As part of our continuing mission to provide you with exceptional heart care, our providers are all part of one team.  This team includes your primary Cardiologist (physician) and Advanced Practice Providers or APPs (Physician Assistants and Nurse Practitioners) who all work together to provide you with the care you need, when you need it.  Your next appointment:   1 year(s)   Provider:     Charlies Arthur, PA-C    We recommend signing up for the patient portal called MyChart.  Sign up information is provided on this After Visit Summary.  MyChart is used to connect with patients for Virtual Visits (Telemedicine).  Patients are able to view lab/test results, encounter notes, upcoming appointments, etc.  Non-urgent messages  can be sent to your provider as well.   To learn more about what you can do with MyChart, go to [email or web address].   Other Instructions   ZIO XT- Long Term Monitor Instructions  Your physician has requested you wear a ZIO patch monitor for 14 days.  This is a single patch monitor. [employer] supplies one patch monitor per enrollment. Additional stickers are not available. Please do not apply patch if you will be having a Nuclear Stress Test,  Echocardiogram, Cardiac CT, MRI, or Chest Xray during the period you would be wearing the  monitor. The patch cannot be worn during these tests. You cannot remove and re-apply the  ZIO XT patch monitor.  Your ZIO patch monitor will be mailed 3 day USPS to your address on file. It may take 3-5 days  to receive your monitor after you have been enrolled.  Once you have received your monitor, please review the enclosed instructions. Your monitor  has already been registered assigning a specific monitor serial # to you.  Billing and Patient Assistance Program Information  We have supplied [employer] with any of your insurance information on file for billing purposes. [employer] offers a sliding scale Patient Assistance Program for patients that do not have  insurance, or whose insurance does not completely cover the cost of the ZIO monitor.  You must apply for the Patient Assistance Program to qualify for this discounted rate.  To apply, please call [employer] at [phone], select option 4, select option 2, ask to apply for  Patient Assistance Program. [place]  will ask your household income, and how many people  are in your household. They will quote your out-of-pocket cost based on that information.  [employer] will also be able to set up a 25-month, interest-free payment plan if needed.  Applying the monitor   Shave hair from upper left chest.  Hold abrader disc by orange tab. Rub abrader in 40 strokes over the upper left chest as  indicated in your  monitor instructions.  Clean area with 4 enclosed alcohol pads. Let dry.  Apply patch as indicated in monitor instructions. Patch will be placed under collarbone on left  side of chest with arrow pointing upward.  Rub patch adhesive wings for 2 minutes. Remove white label marked 1. Remove the white  label marked 2. Rub patch adhesive wings for 2 additional minutes.  While looking in a mirror, press and release button in center of patch. A small green light will  flash 3-4 times. This will be your only indicator that the monitor has been turned on.  Do not shower for the first 24 hours. You may shower after the first 24 hours.  Press the button if you feel a symptom. You will hear a small click. Record Date, Time and  Symptom in the Patient Logbook.  When you are ready to remove the patch, follow instructions on the last 2 pages of Patient  Logbook. Stick patch monitor onto the last page of Patient Logbook.  Place Patient Logbook in the blue and white box. Use locking tab on box and tape box closed  securely. The blue and white box has prepaid postage on it. Please place it in the mailbox as  soon as possible. Your physician should have your test results approximately 7 days after the  monitor has been mailed back to [facility].  Call [facility] Customer Care at [phone] if you have questions regarding  your ZIO XT patch monitor. Call them immediately if you see an orange light blinking on your  monitor.  If your monitor falls off in less than 4 days, contact our Monitor department at [phone].  If your monitor becomes loose or falls off after 4 days call [employer] at [phone] for  suggestions on securing your monitor AT

## 2024-05-25 NOTE — Progress Notes (Unsigned)
Enrolled for [employer] to mail a ZIO AT Live Telemetry monitor to patients address on file.   Dr. Taylor to read. 

## 2024-05-30 DIAGNOSIS — R55 Syncope and collapse: Secondary | ICD-10-CM | POA: Diagnosis not present

## 2024-06-10 ENCOUNTER — Emergency Department (HOSPITAL_COMMUNITY)

## 2024-06-10 ENCOUNTER — Encounter (HOSPITAL_COMMUNITY): Payer: Self-pay | Admitting: Emergency Medicine

## 2024-06-10 ENCOUNTER — Emergency Department (HOSPITAL_COMMUNITY)
Admission: EM | Admit: 2024-06-10 | Discharge: 2024-06-10 | Disposition: A | Discharge status: Home / Self Care | Attending: Emergency Medicine | Admitting: Emergency Medicine

## 2024-06-10 ENCOUNTER — Other Ambulatory Visit: Payer: Self-pay

## 2024-06-10 DIAGNOSIS — M5136 Other intervertebral disc degeneration, lumbar region with discogenic back pain only: Secondary | ICD-10-CM | POA: Diagnosis not present

## 2024-06-10 DIAGNOSIS — W19XXXA Unspecified fall, initial encounter: Secondary | ICD-10-CM | POA: Diagnosis not present

## 2024-06-10 DIAGNOSIS — K5730 Diverticulosis of large intestine without perforation or abscess without bleeding: Secondary | ICD-10-CM | POA: Diagnosis not present

## 2024-06-10 DIAGNOSIS — M4807 Spinal stenosis, lumbosacral region: Secondary | ICD-10-CM | POA: Diagnosis not present

## 2024-06-10 DIAGNOSIS — M545 Low back pain, unspecified: Secondary | ICD-10-CM | POA: Insufficient documentation

## 2024-06-10 DIAGNOSIS — Z743 Need for continuous supervision: Secondary | ICD-10-CM | POA: Diagnosis not present

## 2024-06-10 DIAGNOSIS — M25551 Pain in right hip: Secondary | ICD-10-CM | POA: Diagnosis not present

## 2024-06-10 DIAGNOSIS — R531 Weakness: Secondary | ICD-10-CM | POA: Diagnosis not present

## 2024-06-10 DIAGNOSIS — T1490XA Injury, unspecified, initial encounter: Secondary | ICD-10-CM

## 2024-06-10 DIAGNOSIS — W06XXXA Fall from bed, initial encounter: Secondary | ICD-10-CM | POA: Diagnosis not present

## 2024-06-10 DIAGNOSIS — M25572 Pain in left ankle and joints of left foot: Secondary | ICD-10-CM | POA: Diagnosis not present

## 2024-06-10 DIAGNOSIS — M47817 Spondylosis without myelopathy or radiculopathy, lumbosacral region: Secondary | ICD-10-CM | POA: Diagnosis not present

## 2024-06-10 MED ORDER — OXYCODONE-ACETAMINOPHEN 5-325 MG PO TABS
1.0000 | ORAL_TABLET | Freq: Once | ORAL | Status: AC
  Administered 2024-06-10: 1 via ORAL
  Filled 2024-06-10: qty 1

## 2024-06-10 MED ORDER — ALPRAZOLAM 0.5 MG PO TABS
1.0000 mg | ORAL_TABLET | Freq: Once | ORAL | Status: AC
  Administered 2024-06-10: 1 mg via ORAL
  Filled 2024-06-10: qty 2

## 2024-06-10 MED ORDER — HYDROCODONE-ACETAMINOPHEN 5-325 MG PO TABS: ORAL_TABLET | ORAL | 0 refills | Status: AC

## 2024-06-10 NOTE — ED Notes (Addendum)
 Pt requesting for home med Xanax . Stated, I'm feeling nervous and restless. EDP made aware

## 2024-06-10 NOTE — ED Notes (Signed)
 Pt returned from CT

## 2024-06-10 NOTE — ED Notes (Signed)
Pt provided tooth brush and tooth paste

## 2024-06-10 NOTE — ED Notes (Addendum)
 Pt stood and ambulated to a chair

## 2024-06-10 NOTE — ED Triage Notes (Signed)
 Pt bib EMS after she fell off her step at the end of her bed. Pt reported to EMS that she had been laying in the floor for approximately an hour prior to their arrival. Pt c/o R posterior hip pain per EMS but was amble to ambulate to their stretcher. Pt states pain is worse with sitting than with ambulating.

## 2024-06-10 NOTE — Discharge Instructions (Signed)
 Follow-up with your doctor if not improving.  Use a cane or walker to ambulate with

## 2024-06-12 NOTE — ED Provider Notes (Signed)
 [place] EMERGENCY DEPARTMENT AT [facility] Provider Note   CSN: [id number] Arrival date & time: 06/10/24  9370     Patient presents with: [name] is a 73 y.o. female.   Patient complains of right hip pain.  She fell and hurt in her right hip and lower back  The history is provided by the patient and medical records. No language interpreter was used.  Fall This is a new problem. The current episode started less than 1 hour ago. The problem occurs constantly. The problem has not changed since onset.Pertinent negatives include no chest pain, no abdominal pain and no headaches. Nothing aggravates the symptoms. Nothing relieves the symptoms. She has tried nothing for the symptoms.       Prior to Admission medications   Medication Sig Start Date End Date Taking? Authorizing Provider  HYDROcodone -acetaminophen  (NORCO/VICODIN) 5-325 MG tablet Take 1 every 6 hours for pain that is not relieved by Tylenol  or Motrin alone 06/10/24  Yes Janece Laidlaw, MD  acetaminophen  (TYLENOL ) 650 MG CR tablet Take 1,300 mg by mouth 2 (two) times daily.    [provider]  ALPRAZolam  (XANAX  XR) 1 MG 24 hr tablet Take 1 mg by mouth See admin instructions. Take 1 tablet in the morning and then take 1 tablet as needed for anxiety up to one time a day.    [provider]  apixaban  (ELIQUIS ) 5 MG TABS tablet Take 1 tablet (5 mg total) by mouth 2 (two) times daily. 03/27/24   Terra Fairy PARAS, PA-C  bisoprolol  (ZEBETA ) 5 MG tablet Take 0.5 tablets (2.5 mg total) by mouth daily. 10/25/23   Debera Jayson MATSU, MD  chlorthalidone  (HYGROTON ) 25 MG tablet Take 0.5 tablets (12.5 mg total) by mouth daily. Patient taking differently: Take 12.5 mg by mouth daily. Pt take medication as needed 05/03/24   Hoskins, Carolyn C, NP  Cholecalciferol (VITAMIN D3) 1.25 MG (50000 UT) CAPS Take 1 capsule by mouth every Sunday. 12/31/23   [provider]  esomeprazole  (NEXIUM ) 40 MG  capsule Take 1 capsule (40 mg total) by mouth daily. 04/04/24   Butler, Michael C, MD  glucosamine-chondroitin 500-400 MG tablet Take 2 tablets by mouth every morning.     [provider]  lamoTRIgine  (LAMICTAL ) 200 MG tablet Take 400 mg by mouth at bedtime.    [provider]  Lutein 20 MG CAPS Take 20 mg by mouth every morning. 01/28/21   [provider]  Multiple Vitamin (MULTIVITAMIN) tablet Take 1 tablet by mouth daily. Senior    [provider]  ondansetron  (ZOFRAN -ODT) 4 MG disintegrating tablet Take 1 tablet (4 mg total) by mouth every 8 (eight) hours as needed. 04/04/24   Towana Ozell BROCKS, MD  QUEtiapine  (SEROQUEL  XR) 300 MG 24 hr tablet Take 600 mg by mouth at bedtime. 04/06/24   [provider]  rosuvastatin  (CRESTOR ) 10 MG tablet Take 1 tablet (10 mg total) by mouth daily. 04/24/24   Debera Jayson MATSU, MD    Allergies: Imipramine and Tricor [fenofibrate]    Review of Systems  Constitutional:  Negative for appetite change and fatigue.  HENT:  Negative for congestion, ear discharge and sinus pressure.   Eyes:  Negative for discharge.  Respiratory:  Negative for cough.   Cardiovascular:  Negative for chest pain.  Gastrointestinal:  Negative for abdominal pain and diarrhea.  Genitourinary:  Negative for frequency and hematuria.  Musculoskeletal:  Negative for back pain.  Tenderness to right hip and lumbar spine  Skin:  Negative for rash.  Neurological:  Negative for seizures and headaches.  Psychiatric/Behavioral:  Negative for hallucinations.     Updated Vital Signs BP 110/75 (BP Location: Left Arm)   Pulse 64   Temp 97.8 F (36.6 C) (Oral)   Resp 16   Ht 5' (1.524 m)   Wt 57.6 kg   SpO2 97%   BMI 24.80 kg/m   Physical Exam Vitals and nursing note reviewed.  Constitutional:      Appearance: She is well-developed.  HENT:     Head: Normocephalic.     Nose: Nose normal.  Eyes:     General: No scleral icterus.     Conjunctiva/sclera: Conjunctivae normal.  Neck:     Thyroid : No thyromegaly.  Cardiovascular:     Rate and Rhythm: Normal rate and regular rhythm.     Heart sounds: No murmur heard.    No friction rub. No gallop.  Pulmonary:     Breath sounds: No stridor. No wheezing or rales.  Chest:     Chest wall: No tenderness.  Abdominal:     General: There is no distension.     Tenderness: There is no abdominal tenderness. There is no rebound.  Musculoskeletal:        General: Normal range of motion.     Cervical back: Neck supple.     Comments: Pain with palpitation of right hip pain movement of right hip also pain at lumbar spine and minimal left hip discomfort  Lymphadenopathy:     Cervical: No cervical adenopathy.  Skin:    Findings: No erythema or rash.  Neurological:     Mental Status: She is alert and oriented to person, place, and time.     Motor: No abnormal muscle tone.     Coordination: Coordination normal.  Psychiatric:        Behavior: Behavior normal.     (all labs ordered are listed, but only abnormal results are displayed) Labs Reviewed - No data to display  EKG: None  Radiology: CT Hip Right Wo Contrast Result Date: 06/10/2024 CLINICAL DATA:  Fall from step at end of bed with possible hip fracture. Pain right posterior hip. EXAM: CT OF THE RIGHT HIP WITHOUT CONTRAST TECHNIQUE: Multidetector CT imaging of the right hip was performed according to the standard protocol. Multiplanar CT image reconstructions were also generated. RADIATION DOSE REDUCTION: This exam was performed according to the departmental dose-optimization program which includes automated exposure control, adjustment of the mA and/or kV according to patient size and/or use of iterative reconstruction technique. COMPARISON:  Plain films same day. FINDINGS: Bones/Joint/Cartilage No acute fracture or dislocation. Ligaments Suboptimally assessed by CT. Muscles and Tendons No focal abnormality. Soft tissues No  focal soft tissue injury.  Diverticulosis of the colon. IMPRESSION: 1. No acute fracture or dislocation. 2. Diverticulosis of the colon. Electronically Signed   By: Toribio Agreste M.D.   On: 06/10/2024 12:21     Procedures   Medications Ordered in the ED  ALPRAZolam  (XANAX ) tablet 1 mg (1 mg Oral Given 06/10/24 1326)  oxyCODONE-acetaminophen  (PERCOCET/ROXICET) 5-325 MG per tablet 1 tablet (1 tablet Oral Provided for home use 06/10/24 1446)                                    Medical Decision Making Amount and/or Complexity of Data Reviewed Radiology: ordered.  Risk Prescription drug management.  Patient with a fall with contusion to right hip and lumbar spine.  She will be given some pain medicine and follow-up with PCP     Final diagnoses:  Fall, initial encounter    ED Discharge Orders          Ordered    HYDROcodone -acetaminophen  (NORCO/VICODIN) 5-325 MG tablet        06/10/24 1354               Suzette Pac, MD 06/12/24 [phone]

## 2024-06-14 ENCOUNTER — Emergency Department (HOSPITAL_COMMUNITY)
Admission: EM | Admit: 2024-06-14 | Discharge: 2024-06-14 | Disposition: A | Discharge status: Home / Self Care | Attending: Emergency Medicine | Admitting: Emergency Medicine

## 2024-06-14 ENCOUNTER — Ambulatory Visit: Payer: Self-pay

## 2024-06-14 ENCOUNTER — Other Ambulatory Visit: Payer: Self-pay

## 2024-06-14 ENCOUNTER — Emergency Department (HOSPITAL_COMMUNITY)

## 2024-06-14 ENCOUNTER — Encounter (HOSPITAL_COMMUNITY): Payer: Self-pay

## 2024-06-14 ENCOUNTER — Encounter: Payer: Self-pay | Admitting: Family Medicine

## 2024-06-14 DIAGNOSIS — S7001XA Contusion of right hip, initial encounter: Secondary | ICD-10-CM | POA: Insufficient documentation

## 2024-06-14 DIAGNOSIS — W19XXXA Unspecified fall, initial encounter: Secondary | ICD-10-CM | POA: Diagnosis not present

## 2024-06-14 DIAGNOSIS — K5730 Diverticulosis of large intestine without perforation or abscess without bleeding: Secondary | ICD-10-CM | POA: Diagnosis not present

## 2024-06-14 DIAGNOSIS — S0990XA Unspecified injury of head, initial encounter: Secondary | ICD-10-CM | POA: Insufficient documentation

## 2024-06-14 DIAGNOSIS — S79911A Unspecified injury of right hip, initial encounter: Secondary | ICD-10-CM | POA: Diagnosis present

## 2024-06-14 DIAGNOSIS — I6523 Occlusion and stenosis of bilateral carotid arteries: Secondary | ICD-10-CM | POA: Diagnosis not present

## 2024-06-14 DIAGNOSIS — Z7901 Long term (current) use of anticoagulants: Secondary | ICD-10-CM | POA: Insufficient documentation

## 2024-06-14 DIAGNOSIS — K920 Hematemesis: Secondary | ICD-10-CM | POA: Insufficient documentation

## 2024-06-14 DIAGNOSIS — Y92009 Unspecified place in unspecified non-institutional (private) residence as the place of occurrence of the external cause: Secondary | ICD-10-CM | POA: Diagnosis not present

## 2024-06-14 DIAGNOSIS — K429 Umbilical hernia without obstruction or gangrene: Secondary | ICD-10-CM | POA: Diagnosis not present

## 2024-06-14 DIAGNOSIS — R109 Unspecified abdominal pain: Secondary | ICD-10-CM | POA: Diagnosis not present

## 2024-06-14 LAB — CBC WITH DIFFERENTIAL/PLATELET
Abs Immature Granulocytes: 0.02 K/uL (ref 0.00–0.07)
Basophils Absolute: 0 K/uL (ref 0.0–0.1)
Basophils Relative: 0 %
Eosinophils Absolute: 0 K/uL (ref 0.0–0.5)
Eosinophils Relative: 0 %
HCT: 43.1 % (ref 36.0–46.0)
Hemoglobin: 14 g/dL (ref 12.0–15.0)
Immature Granulocytes: 0 %
Lymphocytes Relative: 25 %
Lymphs Abs: 1.7 K/uL (ref 0.7–4.0)
MCH: 31.9 pg (ref 26.0–34.0)
MCHC: 32.5 g/dL (ref 30.0–36.0)
MCV: 98.2 fL (ref 80.0–100.0)
Monocytes Absolute: 0.7 K/uL (ref 0.1–1.0)
Monocytes Relative: 10 %
Neutro Abs: 4.3 K/uL (ref 1.7–7.7)
Neutrophils Relative %: 65 %
Platelets: 290 K/uL (ref 150–400)
RBC: 4.39 MIL/uL (ref 3.87–5.11)
RDW: 13.3 % (ref 11.5–15.5)
WBC: 6.8 K/uL (ref 4.0–10.5)
nRBC: 0 % (ref 0.0–0.2)

## 2024-06-14 LAB — COMPREHENSIVE METABOLIC PANEL WITH GFR
ALT: 20 U/L (ref 0–44)
AST: 29 U/L (ref 15–41)
Albumin: 4.7 g/dL (ref 3.5–5.0)
Alkaline Phosphatase: 89 U/L (ref 38–126)
Anion gap: 10 (ref 5–15)
BUN: 14 mg/dL (ref 8–23)
CO2: 29 mmol/L (ref 22–32)
Calcium: 9.6 mg/dL (ref 8.9–10.3)
Chloride: 102 mmol/L (ref 98–111)
Creatinine, Ser: 0.6 mg/dL (ref 0.44–1.00)
GFR, Estimated: >60 mL/min (ref >60)
Glucose, Bld: 102 mg/dL — ABNORMAL HIGH (ref 70–99)
Potassium: 3.9 mmol/L (ref 3.5–5.1)
Sodium: 141 mmol/L (ref 135–145)
Total Bilirubin: 0.6 mg/dL (ref 0.0–1.2)
Total Protein: 7.6 g/dL (ref 6.5–8.1)

## 2024-06-14 MED ORDER — ONDANSETRON HCL 4 MG/2ML IJ SOLN
4.0000 mg | Freq: Once | INTRAMUSCULAR | Status: AC
  Administered 2024-06-14: 4 mg via INTRAVENOUS
  Filled 2024-06-14: qty 2

## 2024-06-14 MED ORDER — PANTOPRAZOLE SODIUM 20 MG PO TBEC: 20.0000 mg | DELAYED_RELEASE_TABLET | Freq: Every day | ORAL | 0 refills | Status: AC

## 2024-06-14 MED ORDER — IOHEXOL 300 MG/ML  SOLN
100.0000 mL | Freq: Once | INTRAMUSCULAR | Status: AC | PRN
  Administered 2024-06-14: 100 mL via INTRAVENOUS

## 2024-06-14 MED ORDER — ONDANSETRON 4 MG PO TBDP: ORAL_TABLET | ORAL | 0 refills | Status: AC

## 2024-06-14 MED ORDER — SODIUM CHLORIDE 0.9 % IV BOLUS
1000.0000 mL | Freq: Once | INTRAVENOUS | Status: AC
  Administered 2024-06-14: 1000 mL via INTRAVENOUS

## 2024-06-14 MED ORDER — PANTOPRAZOLE SODIUM 40 MG IV SOLR
40.0000 mg | Freq: Once | INTRAVENOUS | Status: AC
  Administered 2024-06-14: 40 mg via INTRAVENOUS
  Filled 2024-06-14: qty 10

## 2024-06-14 NOTE — ED Triage Notes (Signed)
 Pt arrived via POV c/o N/V that began after her recent visit to the ED for evaluation from a fall at home. Pt reports she is on a blood thinner and is still unsure if she hit her head when she fell. Pt reports a mild headache and reports she has been unable to keep any food or fluids down due to the nausea.

## 2024-06-14 NOTE — Discharge Instructions (Signed)
 Follow-up with Dr. Cinderella one of his colleagues next week if not improving.  If you are improving follow-up with your family doctor

## 2024-06-14 NOTE — Telephone Encounter (Signed)
 FYI Only or Action Required?: FYI only for provider: ED advised.  Patient was last seen in primary care on 05/03/2024 by Mauro Elveria BROCKS, NP.  Called Nurse Triage reporting Fall.  Symptoms began several days ago.  Interventions attempted: Nothing.  Symptoms are: gradually worsening.  Triage Disposition: Go to ED Now (Notify PCP)  Patient/caregiver understands and will follow disposition?: Yes  Copied from CRM [id number]. Topic: Clinical - Red Word Triage >> Jun 14, 2024  2:54 PM [name] wrote: Red Word that prompted transfer to Nurse Triage: Pt stated she fall on Sunday and hit her head and is now nauseated. She wants to know if she should go to the ER? Reason for Disposition  Injury (or injuries) that need emergency care  Answer Assessment - Initial Assessment Questions 1. MECHANISM: How did the fall happen?     Pt states she fell on Sunday, 11/30, and is unsure if she hit her head. She states at the time of fall she was unable to get up so her family had to call EMS. Pt states she was cleared and had imaging of her hips to r/o any breaks or fx. She reports extreme nausea since fall and that she is out of her blood thinners. She questioned if she should go to ED. This RN advised ED disposition as pt cannot report if she did hit her head during fall and is now experiencing symptoms. Discussed alternative risk of falling while on blood thinner. Pt agreeable to disposition and on route to ED. Declined EMS.  Protocols used: Falls and [facility]

## 2024-06-15 NOTE — ED Provider Notes (Signed)
 [facility] AT [facility] Provider Note   CSN: [id number] Arrival date & time: 06/14/24  1531     Patient presents with: Emesis   [name] is a 73 y.o. female.   Patient states that ever since she has fallen she has been having vomiting.  She was given pain medicines for contusion to her right hip and the pain medicine may be causing the nausea.  Patient also is concerned that she hurt her head when she fell.  The history is provided by the patient and medical records. No language interpreter was used.  Emesis Severity:  Mild Timing:  Constant Quality:  Undigested food Progression:  Unchanged Chronicity:  New Recent urination:  Normal Context: not post-tussive   Relieved by:  Nothing Ineffective treatments:  None tried Associated symptoms: no abdominal pain, no cough, no diarrhea and no headaches   Risk factors: no alcohol use        Prior to Admission medications   Medication Sig Start Date End Date Taking? Authorizing Provider  ondansetron  (ZOFRAN -ODT) 4 MG disintegrating tablet 4mg  ODT q4 hours prn nausea/vomit 06/14/24  Yes Kendrik Mcshan, MD  pantoprazole  (PROTONIX ) 20 MG tablet Take 1 tablet (20 mg total) by mouth daily. 06/14/24  Yes Suzette Pac, MD  acetaminophen  (TYLENOL ) 650 MG CR tablet Take 1,300 mg by mouth 2 (two) times daily.    [provider]  ALPRAZolam  (XANAX  XR) 1 MG 24 hr tablet Take 1 mg by mouth See admin instructions. Take 1 tablet in the morning and then take 1 tablet as needed for anxiety up to one time a day.    [provider]  apixaban  (ELIQUIS ) 5 MG TABS tablet Take 1 tablet (5 mg total) by mouth 2 (two) times daily. 03/27/24   Terra Pac PARAS, PA-C  bisoprolol  (ZEBETA ) 5 MG tablet Take 0.5 tablets (2.5 mg total) by mouth daily. 10/25/23   Debera Jayson MATSU, MD  chlorthalidone  (HYGROTON ) 25 MG tablet Take 0.5 tablets (12.5 mg total) by mouth daily. Patient taking differently: Take 12.5 mg  by mouth daily. Pt take medication as needed 05/03/24   Hoskins, Carolyn C, NP  Cholecalciferol (VITAMIN D3) 1.25 MG (50000 UT) CAPS Take 1 capsule by mouth every Sunday. 12/31/23   [provider]  esomeprazole  (NEXIUM ) 40 MG capsule Take 1 capsule (40 mg total) by mouth daily. 04/04/24   Butler, Michael C, MD  glucosamine-chondroitin 500-400 MG tablet Take 2 tablets by mouth every morning.     [provider]  HYDROcodone -acetaminophen  (NORCO/VICODIN) 5-325 MG tablet Take 1 every 6 hours for pain that is not relieved by Tylenol  or Motrin alone 06/10/24   Laurabelle Gorczyca, MD  lamoTRIgine  (LAMICTAL ) 200 MG tablet Take 400 mg by mouth at bedtime.    [provider]  Lutein 20 MG CAPS Take 20 mg by mouth every morning. 01/28/21   [provider]  Multiple Vitamin (MULTIVITAMIN) tablet Take 1 tablet by mouth daily. Senior    [provider]  QUEtiapine  (SEROQUEL  XR) 300 MG 24 hr tablet Take 600 mg by mouth at bedtime. 04/06/24   [provider]  rosuvastatin  (CRESTOR ) 10 MG tablet Take 1 tablet (10 mg total) by mouth daily. 04/24/24   Debera Jayson MATSU, MD    Allergies: Imipramine and Tricor [fenofibrate]    Review of Systems  Constitutional:  Negative for appetite change and fatigue.  HENT:  Negative for congestion, ear discharge and sinus pressure.   Eyes:  Negative  for discharge.  Respiratory:  Negative for cough.   Cardiovascular:  Negative for chest pain.  Gastrointestinal:  Positive for vomiting. Negative for abdominal pain and diarrhea.  Genitourinary:  Negative for frequency and hematuria.  Musculoskeletal:  Negative for back pain.  Skin:  Negative for rash.  Neurological:  Negative for seizures and headaches.  Psychiatric/Behavioral:  Negative for hallucinations.     Updated Vital Signs BP (!) 152/79 (BP Location: Right Arm)   Pulse 85   Temp 97.9 F (36.6 C) (Oral)   Resp 18   Ht 5' (1.524 m)   Wt 57.6 kg   SpO2 97%   BMI  24.80 kg/m   Physical Exam Vitals and nursing note reviewed.  Constitutional:      Appearance: She is well-developed.  HENT:     Head: Normocephalic.     Nose: Nose normal.  Eyes:     General: No scleral icterus.    Conjunctiva/sclera: Conjunctivae normal.  Neck:     Thyroid : No thyromegaly.  Cardiovascular:     Rate and Rhythm: Normal rate and regular rhythm.     Heart sounds: No murmur heard.    No friction rub. No gallop.  Pulmonary:     Breath sounds: No stridor. No wheezing or rales.  Chest:     Chest wall: No tenderness.  Abdominal:     General: There is no distension.     Tenderness: There is no abdominal tenderness. There is no rebound.  Musculoskeletal:        General: Normal range of motion.     Cervical back: Neck supple.  Lymphadenopathy:     Cervical: No cervical adenopathy.  Skin:    Findings: No erythema or rash.  Neurological:     Mental Status: She is oriented to person, place, and time.     Motor: No abnormal muscle tone.     Coordination: Coordination normal.  Psychiatric:        Behavior: Behavior normal.     (all labs ordered are listed, but only abnormal results are displayed) Labs Reviewed  COMPREHENSIVE METABOLIC PANEL WITH GFR - Abnormal; Notable for the following components:      Result Value   Glucose, Bld 102 (*)    All other components within normal limits  CBC WITH DIFFERENTIAL/PLATELET    EKG: None  Radiology: CT ABDOMEN PELVIS W CONTRAST Result Date: 06/14/2024 CLINICAL DATA:  Acute abdominal pain EXAM: CT ABDOMEN AND PELVIS WITH CONTRAST TECHNIQUE: Multidetector CT imaging of the abdomen and pelvis was performed using the standard protocol following bolus administration of intravenous contrast. RADIATION DOSE REDUCTION: This exam was performed according to the departmental dose-optimization program which includes automated exposure control, adjustment of the mA and/or kV according to patient size and/or use of iterative  reconstruction technique. CONTRAST:  OMNIPAQUE  IOHEXOL  300 MG/ML  SOLN COMPARISON:  CT abdomen and pelvis 12/25/2013 FINDINGS: Lower chest: No acute abnormality. Hepatobiliary: No focal liver abnormality is seen. Status post cholecystectomy. No biliary dilatation. Pancreas: Unremarkable. No pancreatic ductal dilatation or surrounding inflammatory changes. Spleen: Normal in size without focal abnormality. Adrenals/Urinary Tract: Adrenal glands are unremarkable. Kidneys are normal, without renal calculi, focal lesion, or hydronephrosis. Bladder is unremarkable. Stomach/Bowel: Stomach is within normal limits. No evidence of bowel wall thickening, distention, or inflammatory changes. There is sigmoid and descending colon diverticulosis. The appendix is not visualized. Vascular/Lymphatic: Aortic atherosclerosis. No enlarged abdominal or pelvic lymph nodes. Reproductive: Status post hysterectomy. No adnexal masses. Other: There is a  small fat containing umbilical hernia. There is no ascites. Musculoskeletal: Degenerative changes affect the spine. IMPRESSION: 1. No acute localizing process in the abdomen or pelvis. 2. Colonic diverticulosis without evidence for diverticulitis. 3. Aortic atherosclerosis. Aortic Atherosclerosis (ICD10-I70.0). Electronically Signed   By: Greig Pique M.D.   On: 06/14/2024 20:08   CT Head Wo Contrast Result Date: 06/14/2024 EXAM: CT HEAD WITHOUT CONTRAST 06/14/2024 07:52:28 PM TECHNIQUE: CT of the head was performed without the administration of intravenous contrast. Automated exposure control, iterative reconstruction, and/or weight based adjustment of the mA/kV was utilized to reduce the radiation dose to as low as reasonably achievable. COMPARISON: 04/17/2024 CLINICAL HISTORY: Head trauma, intracranial arterial injury suspected. FINDINGS: BRAIN AND VENTRICLES: No acute hemorrhage. No evidence of acute infarct. No hydrocephalus. No extra-axial collection. No mass effect or midline  shift. 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 Atherosclerotic calcifications within the cavernous internal carotid arteries. ORBITS: No acute abnormality. Bilateral lens replacement noted. SINUSES: No acute abnormality. SOFT TISSUES AND SKULL: No acute soft tissue abnormality. No skull fracture. IMPRESSION: 1. No acute intracranial abnormality Electronically signed by: Franky Stanford MD 06/14/2024 07:59 PM EST RP Workstation: [id number]     Procedures   Medications Ordered in the ED  sodium chloride  0.9 % bolus 1,000 mL (0 mLs Intravenous Stopped 06/14/24 2018)  ondansetron  (ZOFRAN ) injection 4 mg (4 mg Intravenous Given 06/14/24 1845)  pantoprazole  (PROTONIX ) injection 40 mg (40 mg Intravenous Given 06/14/24 1845)  iohexol  (OMNIPAQUE ) 300 MG/ML solution 100 mL (100 mLs Intravenous Contrast Given 06/14/24 1936)                                    Medical Decision Making Amount and/or Complexity of Data Reviewed Labs: ordered. Radiology: ordered.  Risk Prescription drug management.   Patient with persistent vomiting.  Possibly related to narcotic use.  Labs and CT unremarkable.  Patient is put on a proton pump inhibitor and given nausea medicine and will follow-up with GI if she does not improve     Final diagnoses:  Hematemesis with nausea    ED Discharge Orders          Ordered    pantoprazole  (PROTONIX ) 20 MG tablet  Daily        06/14/24 2040    ondansetron  (ZOFRAN -ODT) 4 MG disintegrating tablet        06/14/24 2040               Suzette Pac, MD 06/15/24 1110

## 2024-06-15 NOTE — Telephone Encounter (Signed)
 Seen at ED

## 2024-06-19 DIAGNOSIS — H353211 Exudative age-related macular degeneration, right eye, with active choroidal neovascularization: Secondary | ICD-10-CM | POA: Diagnosis not present

## 2024-06-19 DIAGNOSIS — H3321 Serous retinal detachment, right eye: Secondary | ICD-10-CM | POA: Diagnosis not present

## 2024-06-19 DIAGNOSIS — H34232 Retinal artery branch occlusion, left eye: Secondary | ICD-10-CM | POA: Diagnosis not present

## 2024-06-19 DIAGNOSIS — H43821 Vitreomacular adhesion, right eye: Secondary | ICD-10-CM | POA: Diagnosis not present

## 2024-06-19 DIAGNOSIS — H35371 Puckering of macula, right eye: Secondary | ICD-10-CM | POA: Diagnosis not present

## 2024-06-19 DIAGNOSIS — H353132 Nonexudative age-related macular degeneration, bilateral, intermediate dry stage: Secondary | ICD-10-CM | POA: Diagnosis not present

## 2024-06-19 DIAGNOSIS — H35351 Cystoid macular degeneration, right eye: Secondary | ICD-10-CM | POA: Diagnosis not present

## 2024-06-19 DIAGNOSIS — H35071 Retinal telangiectasis, right eye: Secondary | ICD-10-CM | POA: Diagnosis not present

## 2024-06-24 DIAGNOSIS — R55 Syncope and collapse: Secondary | ICD-10-CM

## 2024-06-25 ENCOUNTER — Ambulatory Visit: Admitting: Nutrition

## 2024-06-26 ENCOUNTER — Ambulatory Visit: Payer: Self-pay | Admitting: Physician Assistant

## 2024-07-04 ENCOUNTER — Telehealth: Payer: Self-pay

## 2024-07-04 NOTE — Telephone Encounter (Signed)
 Spoke w/ patient regarding recent device alert for no transmissions received for at least 21 days. Patient states home monitor was not plugged in. Patient made sure to plug monitor in while on the phone.   Will continue to monitor for incoming transmissions and update accordingly.

## 2024-07-04 NOTE — Telephone Encounter (Signed)
 Attempted to contact patient. No answer, left message to call back.  Danette, can you please follow up.

## 2024-07-04 NOTE — Telephone Encounter (Signed)
 Pt returning call in regards to device.

## 2024-07-04 NOTE — Telephone Encounter (Signed)
 SABRA

## 2024-07-04 NOTE — Telephone Encounter (Signed)
 Unable to speak w/ patient or patient daughter regarding recent device alert for no transmissions received for at least 21 days. Left voicemail on both numbers listed requesting a call back to ensure monitor is connected.  Will continue to monitor and update accordingly.

## 2024-07-06 ENCOUNTER — Ambulatory Visit: Payer: PPO

## 2024-07-06 DIAGNOSIS — I4819 Other persistent atrial fibrillation: Secondary | ICD-10-CM

## 2024-07-08 ENCOUNTER — Ambulatory Visit: Payer: Self-pay | Admitting: Student in an Organized Health Care Education/Training Program

## 2024-07-08 LAB — CUP PACEART REMOTE DEVICE CHECK
Date Time Interrogation Session: 20251226173307
Implantable Lead Connection Status: 753985
Implantable Lead Connection Status: 753985
Implantable Lead Implant Date: 20170627
Implantable Lead Implant Date: 20170627
Implantable Lead Location: 753859
Implantable Lead Location: 753860
Implantable Lead Model: 377
Implantable Lead Model: 377
Implantable Lead Serial Number: 49436227
Implantable Lead Serial Number: 49471106
Implantable Pulse Generator Implant Date: 20170627
Pulse Gen Model: 394969
Pulse Gen Serial Number: 68786455

## 2024-07-09 NOTE — Progress Notes (Signed)
 Remote PPM Transmission

## 2024-07-10 LAB — OPHTHALMOLOGY REPORT-SCANNED

## 2024-07-10 NOTE — Telephone Encounter (Signed)
 Transmission has come through and pt is on a schedule for remotes

## 2024-07-24 LAB — OPHTHALMOLOGY REPORT-SCANNED

## 2024-07-27 ENCOUNTER — Ambulatory Visit: Attending: Cardiology | Admitting: Cardiology

## 2024-07-27 ENCOUNTER — Encounter: Payer: Self-pay | Admitting: Family Medicine

## 2024-07-27 ENCOUNTER — Encounter: Payer: Self-pay | Admitting: Cardiology

## 2024-07-27 VITALS — BP 132/78 | HR 68 | Ht 64.0 in | Wt 286.6 lb

## 2024-07-27 DIAGNOSIS — I495 Sick sinus syndrome: Secondary | ICD-10-CM

## 2024-07-27 DIAGNOSIS — I10 Essential (primary) hypertension: Secondary | ICD-10-CM | POA: Diagnosis not present

## 2024-07-27 DIAGNOSIS — I483 Typical atrial flutter: Secondary | ICD-10-CM

## 2024-07-27 DIAGNOSIS — E782 Mixed hyperlipidemia: Secondary | ICD-10-CM | POA: Diagnosis not present

## 2024-07-27 DIAGNOSIS — Z950 Presence of cardiac pacemaker: Secondary | ICD-10-CM

## 2024-07-27 MED ORDER — BISOPROLOL FUMARATE 2.5 MG PO TABS: 2.5000 mg | ORAL_TABLET | Freq: Every day | ORAL | 3 refills | Status: AC

## 2024-07-27 NOTE — Patient Instructions (Addendum)
 Medication Instructions:   DECREASE Bisoprolol  to 2.5 mg nightly.    You have already stopped Chlorthalidone      Labwork: None today  Testing/Procedures: None today  Follow-Up: 6 months Dr.McDowell  Any Other Special Instructions Will Be Listed Below (If Applicable).  If you need a refill on your cardiac medications before your next appointment, please call your pharmacy.

## 2024-07-27 NOTE — Progress Notes (Signed)
 "    Cardiology Office Note  Date: 07/27/2024   ID: [name], DOB 1951-06-09, MRN [id number]  History of Present Illness: [name] is a 74 y.o. female last seen by Ms. Leverne RIGGERS in the EP clinic in November 2025, I reviewed the note.  She is here for a routine visit.  Generally doing well, does not report any progressive sense of palpitations.  She states that she sometimes feels a brief sinking sensation in her chest, never syncope or chest pain.  She monitors blood pressure at home and tells me that she has not been able to take either quarter dose bisoprolol  consistently due to feeling worse when her systolic is around 100.  Biotronik pacemaker in place with history of sinus node dysfunction, now followed by Dr. Almetta.  Device check in December 2025 revealed normal function.  We went over her medications.  We discussed stopping chlorthalidone  completely and switching her bisoprolol  2.5 mg tablets to once in the evening to see if she tolerates that better.  I do think that that might help her sense of palpitations at times.  She does not report any spontaneous bleeding problems on Eliquis .  Physical Exam: VS:  BP 132/78   Pulse 68   Ht 5' 4 (1.626 m)   Wt 286 lb 9.6 oz (130 kg)   SpO2 98%   BMI 49.19 kg/m , BMI Body mass index is 49.19 kg/m.  Wt Readings from Last 3 Encounters:  07/27/24 286 lb 9.6 oz (130 kg)  06/14/24 126 lb 15.8 oz (57.6 kg)  06/10/24 127 lb (57.6 kg)    General: Patient appears comfortable at rest. HEENT: Conjunctiva and lids normal. Neck: Supple, no elevated JVP or carotid bruits. Lungs: Clear to auscultation, nonlabored breathing at rest. Cardiac: Regular rate and rhythm, no S3 or significant systolic murmur.  ECG:  An ECG dated 04/20/2024 was personally reviewed today and demonstrated:  Atrial paced rhythm with leftward axis and nonspecific T wave changes.  Labwork: 10/19/2023: TSH 1.520 04/04/2024: B Natriuretic Peptide 28.0 04/17/2024:  Magnesium  2.2 06/14/2024: ALT 20; AST 29; BUN 14; Creatinine, Ser 0.60; Hemoglobin 14.0; Platelets 290; Potassium 3.9; Sodium 141     Component Value Date/Time   CHOL 114 03/29/2024 0910   TRIG 213 (H) 03/29/2024 0910   HDL 39 (L) 03/29/2024 0910   CHOLHDL 2.9 03/29/2024 0910   CHOLHDL 5.0 02/06/2020 0859   VLDL 46 (H) 02/06/2020 0859   LDLCALC 41 03/29/2024 0910   Other Studies Reviewed Today:  No interval cardiac testing for review today.  Assessment and Plan:  1.  Paroxysmal typical atrial flutter with CHA2DS2-VASc score of 3.  She had follow-up with EP in November 2025 and continues on Eliquis  5 mg twice daily for stroke prophylaxis.  Last underwent cardioversion in September 2025.  Interval cardiac monitor in December 2025 showed sinus rhythm with intermittent ventricular pacing, rare PACs and PVCs, no atrial fibrillation or ventricular arrhythmias.  Plan to move bisoprolol  2.5 mg tablets to once in the evening to see if she tolerates this better.   2.  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  Device check in December 2025 was normal.  She is now following with Dr. Almetta.   3.  Asymptomatic carotid artery disease, mild bilateral ICA stenosis by carotid Dopplers in March 2024.  Continue statin therapy.   3.  Primary hypertension.  Stopping chlorthalidone  at this point.   4.  Mixed hyperlipidemia.  LDL 41 in September 2025.  Continue Crestor  10 mg daily.  Disposition:  Follow up 6 months.  Signed, Jayson JUDITHANN Sierras, M.D., F.A.C.C. [facility] "

## 2024-07-30 ENCOUNTER — Other Ambulatory Visit: Payer: Self-pay | Admitting: Family Medicine

## 2024-07-30 ENCOUNTER — Encounter: Admitting: Nutrition

## 2024-07-30 DIAGNOSIS — E119 Type 2 diabetes mellitus without complications: Secondary | ICD-10-CM

## 2024-07-30 DIAGNOSIS — E1169 Type 2 diabetes mellitus with other specified complication: Secondary | ICD-10-CM

## 2024-08-02 ENCOUNTER — Ambulatory Visit: Payer: Self-pay | Admitting: Family Medicine

## 2024-08-02 LAB — LIPID PANEL
Chol/HDL Ratio: 3.7 ratio (ref 0.0–4.4)
Cholesterol, Total: 154 mg/dL (ref 100–199)
HDL: 42 mg/dL
LDL Chol Calc (NIH): 65 mg/dL (ref 0–99)
Triglycerides: 298 mg/dL — ABNORMAL HIGH (ref 0–149)
VLDL Cholesterol Cal: 47 mg/dL — ABNORMAL HIGH (ref 5–40)

## 2024-08-02 LAB — HEMOGLOBIN A1C
Est. average glucose Bld gHb Est-mCnc: 134 mg/dL
Hgb A1c MFr Bld: 6.3 % — ABNORMAL HIGH (ref 4.8–5.6)

## 2024-08-02 LAB — MICROALBUMIN / CREATININE URINE RATIO
Creatinine, Urine: 21.2 mg/dL
Microalb/Creat Ratio: <14 mg/g{creat} (ref 0–29)
Microalbumin, Urine: <3 ug/mL

## 2024-08-03 ENCOUNTER — Encounter: Payer: Self-pay | Admitting: Cardiology

## 2024-08-07 ENCOUNTER — Ambulatory Visit: Admitting: Family Medicine

## 2024-10-05 ENCOUNTER — Ambulatory Visit

## 2024-10-09 ENCOUNTER — Ambulatory Visit: Admitting: Family Medicine

## 2024-10-22 ENCOUNTER — Encounter: Admitting: Nutrition

## 2024-11-01 ENCOUNTER — Ambulatory Visit (HOSPITAL_COMMUNITY): Admitting: Internal Medicine

## 2024-11-30 ENCOUNTER — Ambulatory Visit

## 2025-01-04 ENCOUNTER — Ambulatory Visit

## 2025-01-17 ENCOUNTER — Telehealth: Admitting: Adult Health

## 2025-04-05 ENCOUNTER — Ambulatory Visit

## 2025-07-05 ENCOUNTER — Ambulatory Visit
